# Patient Record
Sex: Male | Born: 1950 | Race: White | Hispanic: No | Marital: Married | Smoking: Never smoker
Health system: Southern US, Community
[De-identification: ages and names within clinical notes are randomized; demographics above are authoritative.]

## PROBLEM LIST (undated history)

## (undated) DIAGNOSIS — F32A Depression, unspecified: Secondary | ICD-10-CM

## (undated) DIAGNOSIS — K219 Gastro-esophageal reflux disease without esophagitis: Secondary | ICD-10-CM

## (undated) DIAGNOSIS — F1021 Alcohol dependence, in remission: Secondary | ICD-10-CM

## (undated) DIAGNOSIS — E039 Hypothyroidism, unspecified: Secondary | ICD-10-CM

## (undated) DIAGNOSIS — I609 Nontraumatic subarachnoid hemorrhage, unspecified: Secondary | ICD-10-CM

## (undated) DIAGNOSIS — M2550 Pain in unspecified joint: Secondary | ICD-10-CM

## (undated) DIAGNOSIS — E785 Hyperlipidemia, unspecified: Secondary | ICD-10-CM

## (undated) DIAGNOSIS — K449 Diaphragmatic hernia without obstruction or gangrene: Secondary | ICD-10-CM

## (undated) DIAGNOSIS — C439 Malignant melanoma of skin, unspecified: Secondary | ICD-10-CM

## (undated) DIAGNOSIS — K5730 Diverticulosis of large intestine without perforation or abscess without bleeding: Secondary | ICD-10-CM

## (undated) DIAGNOSIS — I499 Cardiac arrhythmia, unspecified: Secondary | ICD-10-CM

## (undated) DIAGNOSIS — M109 Gout, unspecified: Secondary | ICD-10-CM

## (undated) DIAGNOSIS — Z8669 Personal history of other diseases of the nervous system and sense organs: Secondary | ICD-10-CM

## (undated) DIAGNOSIS — F319 Bipolar disorder, unspecified: Secondary | ICD-10-CM

## (undated) DIAGNOSIS — I639 Cerebral infarction, unspecified: Secondary | ICD-10-CM

## (undated) DIAGNOSIS — M8580 Other specified disorders of bone density and structure, unspecified site: Secondary | ICD-10-CM

## (undated) HISTORY — DX: Hypothyroidism, unspecified: E03.9

## (undated) HISTORY — DX: Diaphragmatic hernia without obstruction or gangrene: K44.9

## (undated) HISTORY — DX: Gout, unspecified: M10.9

## (undated) HISTORY — DX: Depression, unspecified: F32.A

## (undated) HISTORY — DX: Hyperlipidemia, unspecified: E78.5

## (undated) HISTORY — DX: Cerebral infarction, unspecified: I63.9

## (undated) HISTORY — DX: Alcohol dependence, in remission: F10.21

## (undated) HISTORY — DX: Nontraumatic subarachnoid hemorrhage, unspecified: I60.9

## (undated) HISTORY — DX: Personal history of other diseases of the nervous system and sense organs: Z86.69

## (undated) HISTORY — DX: Diverticulosis of large intestine without perforation or abscess without bleeding: K57.30

## (undated) HISTORY — DX: Other specified disorders of bone density and structure, unspecified site: M85.80

## (undated) HISTORY — PX: OTHER SURGICAL HISTORY: SHX169

## (undated) HISTORY — PX: LOOP RECORDER INSERTION: EP1214

## (undated) HISTORY — DX: Bipolar disorder, unspecified: F31.9

## (undated) HISTORY — DX: Cardiac arrhythmia, unspecified: I49.9

## (undated) HISTORY — DX: Pain in unspecified joint: M25.50

## (undated) HISTORY — DX: Malignant melanoma of skin, unspecified: C43.9

---
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
## 1977-04-27 HISTORY — PX: VASECTOMY: SHX75

## 1981-04-27 HISTORY — PX: UPPER GASTROINTESTINAL ENDOSCOPY: SHX188

## 2001-04-27 HISTORY — PX: COLONOSCOPY: SHX174

## 2002-02-21 ENCOUNTER — Encounter: Payer: Self-pay | Admitting: Internal Medicine

## 2004-02-12 ENCOUNTER — Encounter: Admission: RE | Admit: 2004-02-12 | Discharge: 2004-02-12 | Payer: Self-pay | Admitting: Orthopedic Surgery

## 2004-03-07 ENCOUNTER — Ambulatory Visit: Payer: Self-pay | Admitting: Internal Medicine

## 2004-10-06 ENCOUNTER — Ambulatory Visit: Payer: Self-pay | Admitting: Internal Medicine

## 2004-10-09 ENCOUNTER — Ambulatory Visit: Payer: Self-pay | Admitting: Internal Medicine

## 2004-11-11 ENCOUNTER — Encounter: Payer: Self-pay | Admitting: Internal Medicine

## 2004-11-14 ENCOUNTER — Encounter: Admission: RE | Admit: 2004-11-14 | Discharge: 2004-11-14 | Payer: Self-pay

## 2005-02-26 ENCOUNTER — Ambulatory Visit: Payer: Self-pay | Admitting: Internal Medicine

## 2005-12-21 ENCOUNTER — Ambulatory Visit: Payer: Self-pay | Admitting: Internal Medicine

## 2005-12-31 ENCOUNTER — Ambulatory Visit: Payer: Self-pay | Admitting: Internal Medicine

## 2006-09-22 DIAGNOSIS — Z9089 Acquired absence of other organs: Secondary | ICD-10-CM | POA: Insufficient documentation

## 2006-09-22 DIAGNOSIS — F319 Bipolar disorder, unspecified: Secondary | ICD-10-CM | POA: Insufficient documentation

## 2006-09-22 DIAGNOSIS — G43009 Migraine without aura, not intractable, without status migrainosus: Secondary | ICD-10-CM | POA: Insufficient documentation

## 2006-11-17 ENCOUNTER — Ambulatory Visit: Payer: Self-pay | Admitting: Internal Medicine

## 2006-11-17 DIAGNOSIS — K219 Gastro-esophageal reflux disease without esophagitis: Secondary | ICD-10-CM | POA: Insufficient documentation

## 2006-11-18 ENCOUNTER — Encounter: Payer: Self-pay | Admitting: Internal Medicine

## 2006-11-25 ENCOUNTER — Encounter (INDEPENDENT_AMBULATORY_CARE_PROVIDER_SITE_OTHER): Payer: Self-pay | Admitting: *Deleted

## 2007-03-17 ENCOUNTER — Ambulatory Visit: Payer: Self-pay | Admitting: Internal Medicine

## 2007-08-01 ENCOUNTER — Encounter: Payer: Self-pay | Admitting: Internal Medicine

## 2008-02-08 ENCOUNTER — Ambulatory Visit: Payer: Self-pay | Admitting: Internal Medicine

## 2008-02-17 ENCOUNTER — Ambulatory Visit: Payer: Self-pay | Admitting: Internal Medicine

## 2008-11-26 ENCOUNTER — Ambulatory Visit: Payer: Self-pay | Admitting: Internal Medicine

## 2008-11-26 DIAGNOSIS — R9431 Abnormal electrocardiogram [ECG] [EKG]: Secondary | ICD-10-CM | POA: Insufficient documentation

## 2008-11-26 DIAGNOSIS — T887XXA Unspecified adverse effect of drug or medicament, initial encounter: Secondary | ICD-10-CM | POA: Insufficient documentation

## 2008-11-26 DIAGNOSIS — R002 Palpitations: Secondary | ICD-10-CM | POA: Insufficient documentation

## 2008-11-30 ENCOUNTER — Ambulatory Visit: Payer: Self-pay

## 2008-11-30 ENCOUNTER — Encounter: Payer: Self-pay | Admitting: Cardiology

## 2008-11-30 ENCOUNTER — Encounter (INDEPENDENT_AMBULATORY_CARE_PROVIDER_SITE_OTHER): Payer: Self-pay | Admitting: *Deleted

## 2008-12-12 ENCOUNTER — Telehealth (INDEPENDENT_AMBULATORY_CARE_PROVIDER_SITE_OTHER): Payer: Self-pay | Admitting: *Deleted

## 2009-01-01 ENCOUNTER — Encounter (INDEPENDENT_AMBULATORY_CARE_PROVIDER_SITE_OTHER): Payer: Self-pay | Admitting: *Deleted

## 2009-01-01 ENCOUNTER — Ambulatory Visit: Payer: Self-pay | Admitting: Internal Medicine

## 2009-01-01 LAB — CONVERTED CEMR LAB
OCCULT 1: NEGATIVE
OCCULT 2: NEGATIVE
OCCULT 3: NEGATIVE

## 2009-01-06 LAB — CONVERTED CEMR LAB
Cholesterol: 193 mg/dL (ref 0–200)
Folate: 5.5 ng/mL
HDL: 47 mg/dL (ref >39.00)
LDL Cholesterol: 115 mg/dL — ABNORMAL HIGH (ref 0–99)
Total CHOL/HDL Ratio: 4
Triglycerides: 154 mg/dL — ABNORMAL HIGH (ref 0.0–149.0)
VLDL: 30.8 mg/dL (ref 0.0–40.0)
Vitamin B-12: 211 pg/mL (ref 211–911)

## 2009-01-10 ENCOUNTER — Ambulatory Visit: Payer: Self-pay | Admitting: Internal Medicine

## 2009-01-10 DIAGNOSIS — D539 Nutritional anemia, unspecified: Secondary | ICD-10-CM | POA: Insufficient documentation

## 2009-01-10 DIAGNOSIS — E785 Hyperlipidemia, unspecified: Secondary | ICD-10-CM

## 2009-01-10 HISTORY — DX: Hyperlipidemia, unspecified: E78.5

## 2009-01-14 ENCOUNTER — Telehealth (INDEPENDENT_AMBULATORY_CARE_PROVIDER_SITE_OTHER): Payer: Self-pay | Admitting: *Deleted

## 2009-02-07 ENCOUNTER — Ambulatory Visit: Payer: Self-pay | Admitting: Internal Medicine

## 2009-03-15 ENCOUNTER — Ambulatory Visit: Payer: Self-pay | Admitting: Internal Medicine

## 2009-04-16 ENCOUNTER — Ambulatory Visit: Payer: Self-pay | Admitting: Internal Medicine

## 2009-05-29 ENCOUNTER — Ambulatory Visit: Payer: Self-pay | Admitting: Internal Medicine

## 2009-06-13 ENCOUNTER — Ambulatory Visit: Payer: Self-pay | Admitting: Internal Medicine

## 2009-07-09 ENCOUNTER — Ambulatory Visit: Payer: Self-pay | Admitting: Internal Medicine

## 2009-07-25 ENCOUNTER — Ambulatory Visit: Payer: Self-pay | Admitting: Internal Medicine

## 2009-08-02 ENCOUNTER — Ambulatory Visit: Payer: Self-pay | Admitting: Internal Medicine

## 2009-08-02 DIAGNOSIS — R634 Abnormal weight loss: Secondary | ICD-10-CM | POA: Insufficient documentation

## 2009-08-02 DIAGNOSIS — E781 Pure hyperglyceridemia: Secondary | ICD-10-CM | POA: Insufficient documentation

## 2010-05-16 ENCOUNTER — Ambulatory Visit
Admission: RE | Admit: 2010-05-16 | Discharge: 2010-05-16 | Payer: Self-pay | Source: Home / Self Care | Attending: Internal Medicine | Admitting: Internal Medicine

## 2010-05-16 ENCOUNTER — Encounter: Payer: Self-pay | Admitting: Internal Medicine

## 2010-05-16 DIAGNOSIS — R05 Cough: Secondary | ICD-10-CM | POA: Insufficient documentation

## 2010-05-16 DIAGNOSIS — F1010 Alcohol abuse, uncomplicated: Secondary | ICD-10-CM | POA: Insufficient documentation

## 2010-05-16 DIAGNOSIS — R059 Cough, unspecified: Secondary | ICD-10-CM | POA: Insufficient documentation

## 2010-05-17 DIAGNOSIS — M8580 Other specified disorders of bone density and structure, unspecified site: Secondary | ICD-10-CM | POA: Insufficient documentation

## 2010-05-19 LAB — CONVERTED CEMR LAB
ALT: 46 units/L (ref 0–53)
AST: 77 units/L — ABNORMAL HIGH (ref 0–37)
Albumin: 4.5 g/dL (ref 3.5–5.2)
Alkaline Phosphatase: 69 units/L (ref 39–117)
BUN: 8 mg/dL (ref 6–23)
Basophils Absolute: 0 10*3/uL (ref 0.0–0.1)
Basophils Relative: 1 % (ref 0–1)
Bilirubin, Direct: 0.3 mg/dL (ref 0.0–0.3)
CO2: 26 meq/L (ref 19–32)
Calcium: 9.5 mg/dL (ref 8.4–10.5)
Chloride: 103 meq/L (ref 96–112)
Cholesterol: 264 mg/dL — ABNORMAL HIGH (ref 0–200)
Creatinine, Ser: 0.9 mg/dL (ref 0.40–1.50)
Eosinophils Absolute: 0.1 10*3/uL (ref 0.0–0.7)
Eosinophils Relative: 1 % (ref 0–5)
Glucose, Bld: 100 mg/dL — ABNORMAL HIGH (ref 70–99)
HCT: 43.5 % (ref 39.0–52.0)
HDL: 174 mg/dL (ref >39)
Hemoglobin: 15.8 g/dL (ref 13.0–17.0)
Indirect Bilirubin: 0.6 mg/dL (ref 0.0–0.9)
LDL Cholesterol: 78 mg/dL (ref 0–99)
Lymphocytes Relative: 49 % — ABNORMAL HIGH (ref 12–46)
Lymphs Abs: 2 10*3/uL (ref 0.7–4.0)
MCHC: 36.3 g/dL — ABNORMAL HIGH (ref 30.0–36.0)
MCV: 101.9 fL — ABNORMAL HIGH (ref 78.0–100.0)
Monocytes Absolute: 0.4 10*3/uL (ref 0.1–1.0)
Monocytes Relative: 9 % (ref 3–12)
Neutro Abs: 1.6 10*3/uL — ABNORMAL LOW (ref 1.7–7.7)
Neutrophils Relative %: 40 % — ABNORMAL LOW (ref 43–77)
Platelets: 102 10*3/uL — ABNORMAL LOW (ref 150–400)
Potassium: 4 meq/L (ref 3.5–5.3)
RBC: 4.27 M/uL (ref 4.22–5.81)
RDW: 13.6 % (ref 11.5–15.5)
Sodium: 144 meq/L (ref 135–145)
TSH: 1.246 microintl units/mL (ref 0.350–4.500)
Total Bilirubin: 0.9 mg/dL (ref 0.3–1.2)
Total CHOL/HDL Ratio: 1.5
Total Protein: 6.7 g/dL (ref 6.0–8.3)
Triglycerides: 59 mg/dL (ref <150)
VLDL: 12 mg/dL (ref 0–40)
WBC: 4 10*3/uL (ref 4.0–10.5)

## 2010-05-20 ENCOUNTER — Ambulatory Visit
Admission: RE | Admit: 2010-05-20 | Discharge: 2010-05-20 | Payer: Self-pay | Source: Home / Self Care | Attending: Internal Medicine | Admitting: Internal Medicine

## 2010-05-25 LAB — CONVERTED CEMR LAB
ALT: 13 units/L (ref 0–53)
ALT: 8 units/L (ref 0–53)
ALT: 9 units/L (ref 0–53)
AST: 16 units/L (ref 0–37)
AST: 18 units/L (ref 0–37)
AST: 18 units/L (ref 0–37)
Albumin: 4.5 g/dL (ref 3.5–5.2)
Albumin: 4.6 g/dL (ref 3.5–5.2)
Alkaline Phosphatase: 49 units/L (ref 39–117)
Alkaline Phosphatase: 60 units/L (ref 39–117)
BUN: 10 mg/dL (ref 6–23)
BUN: 12 mg/dL (ref 6–23)
BUN: 15 mg/dL (ref 6–23)
Basophils Absolute: 0 10*3/uL (ref 0.0–0.1)
Basophils Absolute: 0 10*3/uL (ref 0.0–0.1)
Basophils Absolute: 0 10*3/uL (ref 0.0–0.1)
Basophils Relative: 0.2 % (ref 0.0–1.0)
Basophils Relative: 0.4 % (ref 0.0–3.0)
Basophils Relative: 0.5 % (ref 0.0–3.0)
Bilirubin, Direct: 0.1 mg/dL (ref 0.0–0.3)
Bilirubin, Direct: 0.2 mg/dL (ref 0.0–0.3)
CO2: 24 meq/L (ref 19–32)
Calcium: 10.8 mg/dL — ABNORMAL HIGH (ref 8.4–10.5)
Calcium: 9.4 mg/dL (ref 8.4–10.5)
Chloride: 110 meq/L (ref 96–112)
Creatinine, Ser: 1 mg/dL (ref 0.40–1.50)
Creatinine, Ser: 1.1 mg/dL (ref 0.4–1.5)
Creatinine, Ser: 1.2 mg/dL (ref 0.4–1.5)
Eosinophils Absolute: 0.1 10*3/uL (ref 0.0–0.6)
Eosinophils Absolute: 0.1 10*3/uL (ref 0.0–0.7)
Eosinophils Absolute: 0.3 10*3/uL (ref 0.0–0.7)
Eosinophils Relative: 0.8 % (ref 0.0–5.0)
Eosinophils Relative: 2.2 % (ref 0.0–5.0)
Eosinophils Relative: 2.9 % (ref 0.0–5.0)
Free T4: 0.7 ng/dL (ref 0.6–1.6)
Free T4: 1.02 ng/dL (ref 0.80–1.80)
GFR calc Af Amer: 80 mL/min
GFR calc non Af Amer: 66 mL/min
Glucose, Bld: 112 mg/dL — ABNORMAL HIGH (ref 70–99)
HCT: 36.1 % — ABNORMAL LOW (ref 39.0–52.0)
HCT: 40 % (ref 39.0–52.0)
HCT: 42.1 % (ref 39.0–52.0)
Hemoglobin: 13 g/dL (ref 13.0–17.0)
Hemoglobin: 13.6 g/dL (ref 13.0–17.0)
Hemoglobin: 15 g/dL (ref 13.0–17.0)
Hgb A1c MFr Bld: 4.2 % — ABNORMAL LOW (ref 4.6–6.1)
Indirect Bilirubin: 0.8 mg/dL (ref 0.0–0.9)
Lymphocytes Relative: 20.4 % (ref 12.0–46.0)
Lymphocytes Relative: 30.7 % (ref 12.0–46.0)
Lymphocytes Relative: 8.1 % — ABNORMAL LOW (ref 12.0–46.0)
Lymphs Abs: 1.3 10*3/uL (ref 0.7–4.0)
Lymphs Abs: 1.9 10*3/uL (ref 0.7–4.0)
MCHC: 34.1 g/dL (ref 30.0–36.0)
MCHC: 35.7 g/dL (ref 30.0–36.0)
MCHC: 36 g/dL (ref 30.0–36.0)
MCV: 104.5 fL — ABNORMAL HIGH (ref 78.0–100.0)
MCV: 97.5 fL (ref 78.0–100.0)
MCV: 98.2 fL (ref 78.0–100.0)
Magnesium: 2.2 mg/dL (ref 1.5–2.5)
Monocytes Absolute: 0.3 10*3/uL (ref 0.1–1.0)
Monocytes Absolute: 0.4 10*3/uL (ref 0.1–1.0)
Monocytes Absolute: 0.5 10*3/uL (ref 0.2–0.7)
Monocytes Relative: 3.3 % (ref 3.0–11.0)
Monocytes Relative: 4.3 % (ref 3.0–12.0)
Monocytes Relative: 7.2 % (ref 3.0–12.0)
Neutro Abs: 12.7 10*3/uL — ABNORMAL HIGH (ref 1.4–7.7)
Neutro Abs: 2.4 10*3/uL (ref 1.4–7.7)
Neutro Abs: 6.6 10*3/uL (ref 1.4–7.7)
Neutrophils Relative %: 59.4 % (ref 43.0–77.0)
Neutrophils Relative %: 72 % (ref 43.0–77.0)
Neutrophils Relative %: 87.6 % — ABNORMAL HIGH (ref 43.0–77.0)
PSA: 0.79 ng/mL (ref 0.10–4.00)
Platelets: 142 10*3/uL — ABNORMAL LOW (ref 150.0–400.0)
Platelets: 167 10*3/uL (ref 150.0–400.0)
Platelets: 240 10*3/uL (ref 150–400)
Potassium: 3.9 meq/L (ref 3.5–5.1)
Potassium: 4 meq/L (ref 3.5–5.1)
Potassium: 4.2 meq/L (ref 3.5–5.3)
RBC: 3.68 M/uL — ABNORMAL LOW (ref 4.22–5.81)
RBC: 3.82 M/uL — ABNORMAL LOW (ref 4.22–5.81)
RBC: 4.32 M/uL (ref 4.22–5.81)
RDW: 12.9 % (ref 11.5–14.6)
RDW: 13 % (ref 11.5–14.6)
RDW: 13.4 % (ref 11.5–14.6)
Sodium: 142 meq/L (ref 135–145)
TSH: 2.21 microintl units/mL (ref 0.35–5.50)
TSH: 2.602 microintl units/mL (ref 0.350–4.500)
TSH: 4.21 microintl units/mL (ref 0.35–5.50)
Total Bilirubin: 0.9 mg/dL (ref 0.3–1.2)
Total Bilirubin: 1.6 mg/dL — ABNORMAL HIGH (ref 0.3–1.2)
Total Protein: 6.8 g/dL (ref 6.0–8.3)
Total Protein: 6.8 g/dL (ref 6.0–8.3)
Vitamin B-12: 424 pg/mL (ref 211–911)
WBC: 14.5 10*3/uL — ABNORMAL HIGH (ref 4.5–10.5)
WBC: 4.1 10*3/uL — ABNORMAL LOW (ref 4.5–10.5)
WBC: 9.2 10*3/uL (ref 4.5–10.5)

## 2010-05-27 ENCOUNTER — Ambulatory Visit
Admission: RE | Admit: 2010-05-27 | Discharge: 2010-05-27 | Payer: Self-pay | Source: Home / Self Care | Attending: Internal Medicine | Admitting: Internal Medicine

## 2010-05-27 NOTE — Assessment & Plan Note (Signed)
Summary: CPX     PH   Vital Signs:  Patient Profile:   60 Years Old Male Weight:      184.38 pounds Pulse rate:   60 / minute Pulse rhythm:   regular BP sitting:   150 / 76  (left arm) Cuff size:   large  Pt. in pain?   no  Vitals Entered By: Wendall Stade (November 17, 2006 10:00 AM)                 Chief Complaint:  cpx.  History of Present Illness: trouble swallowing when talking not when eating for  6 weeks.Lasts < 10 seconds; no dysphagia, no globus..   Current Allergies: No known allergies  Updated/Current Medications (including changes made in today's visit):  LITHIUM CARBONATE 300 MG  CAPS (LITHIUM CARBONATE) 1 qam 1qnoon  and 2qhs SEROQUEL 50 MG  TABS (QUETIAPINE FUMARATE) 2 tabs qhs MAXALT 10 MG  TABS (RIZATRIPTAN BENZOATE) as needed migraine NAPROSYN 500 MG  TABS (NAPROXEN) as needed migraines TOPAMAX 100 MG  TABS (TOPIRAMATE) 1 by mouth qhs CIALIS 20 MG  TABS (TADALAFIL) prn [facility] OMEPRAZOLE 20 MG  TBEC (OMEPRAZOLE) 1 two times a day ac b'fast & eve mealX 8 weeks   Past Medical History:    [facility] admissions ; relapse 5/08;  1984 cp, Dx ERD  Past Surgical History:    Colonoscopy    Tonsillectomy   Family History:    Father: COAD,MI 7    Mother: COAD    Siblings: neg   Risk Factors:  Tobacco use:  never Alcohol use:  yes    Type:  none since 6/08 Exercise:  yes    Type:  walks 3-4X/week 1.5 mpd w/o C-P sx ,minor DOE   Review of Systems  General      Denies chills, fatigue, fever, loss of appetite, malaise, sleep disorder, sweats, weakness, and weight loss.  Eyes      Denies blurring, discharge, double vision, eye irritation, eye pain, halos, itching, light sensitivity, red eye, vision loss-1 eye, and vision loss-both eyes.      last exam 2006; asx   CV      Denies bluish discoloration of lips or nails, chest pain or discomfort, difficulty breathing at night, difficulty breathing while lying down, fainting, fatigue, leg cramps with  exertion, lightheadness, near fainting, palpitations, shortness of breath with exertion, swelling of feet, swelling of hands, and weight gain.      ankles down ? white & cold @ nite supine  Resp      Denies chest discomfort, cough, excessive snoring, hypersomnolence, morning headaches, shortness of breath, and sputum productive.  GI      See HPI      Complains of indigestion.      Denies abdominal pain, bloody stools, change in bowel habits, constipation, dark tarry stools, diarrhea, excessive appetite, gas, hemorrhoids, loss of appetite, nausea, and vomiting.      Rx TUMS, Rolaids  GU      Complains of decreased libido and erectile dysfunction.      Denies dysuria, genital sores, hematuria, incontinence, nocturia, urinary frequency, and urinary hesitancy.      Cialis of benefit  MS      Denies joint pain, joint redness, joint swelling, loss of strength, low back pain, mid back pain, muscle aches, muscle , cramps, muscle weakness, stiffness, and thoracic pain.  Derm      Denies changes in color of skin, changes in nail beds, dryness, excessive  perspiration, flushing, hair loss, itching, lesion(s), poor wound healing, and rash.  Neuro      Complains of headaches.      sees Dr Meryl Crutch, on Maxalt  Psych      Complains of anxiety, depression, easily angered, easily tearful, and irritability.      work pressure; sees Dr Carver Fila @ [facility], Lithium level off. To see Dr Evelene Croon  Endo      Denies cold intolerance, excessive hunger, excessive thirst, excessive urination, heat intolerance, polyuria, and weight change.  Heme      Denies abnormal bruising and bleeding.  Allergy      Denies hives or rash, itching eyes, persistent infections, seasonal allergies, and sneezing.   Physical Exam  General:     Well-developed,well-nourished,in no acute distress; alert,appropriate and cooperative throughout examinationBeard & moustache Head:     Normocephalic and atraumatic without obvious  abnormalities. No apparent alopecia or balding. Eyes:     sl art narrowing Ears:     TM sl dull Nose:     External nasal examination shows no deformity or inflammation. Nasal mucosa are pink and moist without lesions or exudates. Mouth:     Oral mucosa and oropharynx without lesions or exudates.  Teeth in good repair. Neck:     No deformities, masses, or tenderness noted. Lungs:     Normal respiratory effort, chest expands symmetrically. Lungs are clear to auscultation, no crackles or wheezes. Heart:     Normal rate and regular rhythm. S1 and S2 normal without gallop, murmur, click, rub or other extra sounds. Abdomen:     Bowel sounds positive,abdomen soft and non-tender without masses, organomegaly. Large reducible hernia R Rectal:     No external abnormalities noted. Normal sphincter tone. No rectal masses or tenderness.FOB neg Genitalia:     Testes bilaterally descended without nodularity, tenderness or masses. No scrotal masses or lesions. No penis lesions or urethral discharge. Prostate:     Prostate gland firm and smooth, no enlargement, nodularity, tenderness, mass, asymmetry or induration. Msk:     No deformity or scoliosis noted of thoracic or lumbar spine.   Pulses:     R and L carotid,radial,femoral,dorsalis pedis and posterior tibial pulses are full and equal bilaterally Extremities:     No clubbing, cyanosis, edema, or deformity noted with normal full range of motion of all joints.   Neurologic:     alert & oriented X3, gait normal, and DTRs symmetrical and normal.   Skin:     Intact without suspicious lesions or rashes Cervical Nodes:     No lymphadenopathy noted Axillary Nodes:     No palpable lymphadenopathy Psych:     normally interactive, good eye contact, subdued, and slightly anxious.      Impression & Recommendations:  Problem # 1:  EXAMINATION, ROUTINE MEDICAL (ICD-V70.0)  Orders: EKG w/ Interpretation (93000) T- * Misc. Laboratory test  [phone])   Problem # 2:  REFLUX, ESOPHAGEAL (ICD-530.81)  His updated medication list for this problem includes:    [facility] Omeprazole 20 Mg Tbec (Omeprazole) .Marland Kitchen... 1 two times a day ac b'fast & eve mealx 8 weeks   Problem # 3:  HYPERLIPIDEMIA NEC/NOS (ICD-272.4)  Orders: T- * Misc. Laboratory test [phone])   Medications Added to Medication List This Visit: 1)  Lithium Carbonate 300 Mg Caps (Lithium carbonate) .Marland Kitchen.. 1 qam 1qnoon  and 2qhs 2)  Seroquel 50 Mg Tabs (Quetiapine fumarate) .... 2 tabs qhs 3)  Maxalt 10 Mg Tabs (Rizatriptan benzoate) .Marland KitchenMarland KitchenMarland Kitchen  As needed migraine 4)  Naprosyn 500 Mg Tabs (Naproxen) .... As needed migraines 5)  Topamax 100 Mg Tabs (Topiramate) .Marland Kitchen.. 1 by mouth qhs 6)  Cialis 20 Mg Tabs (Tadalafil) .... Prn 7)  Cvs Omeprazole 20 Mg Tbec (Omeprazole) .Marland Kitchen.. 1 two times a day ac b'fast & eve mealx 8 weeks  Other Orders: Venipuncture (46962) TLB-BMP (Basic Metabolic Panel-BMET) (80048-METABOL) TLB-CBC Platelet - w/Differential (85025-CBCD) TLB-Hepatic/Liver Function Pnl (80076-HEPATIC) TLB-TSH (Thyroid Stimulating Hormone) (84443-TSH) TLB-PSA (Prostate Specific Antigen) (84153-PSA)   Patient Instructions: 1)  GI consult if throat symptoms persist after Omeprazole Rx X 8 weeks. 2)  Avoid foods high in acid (tomatoes, citrus juices, spicy foods). Avoid eating within two hours of lying down or before exercising. Do not over eat; try smaller more frequent meals. Elevate head of bed twelve inches when sleeping.    Prescriptions: [facility]S OMEPRAZOLE 20 MG  TBEC (OMEPRAZOLE) 1 two times a day ac b'fast & eve mealX 8 weeks  #60 x 1   Entered and Authorized by:   Marga Melnick MD   Signed by:   Marga Melnick MD on 11/17/2006   Method used:   Print then Give to Patient   RxID:   [id number]

## 2010-05-27 NOTE — Assessment & Plan Note (Signed)
Summary: b12 shot/kdc  Nurse Visit   Allergies: No Known Drug Allergies  Medication Administration  Injection # 1:    Medication: Vit B12 1000 mcg    Diagnosis: UNSPECIFIED ANEMIA (ICD-285.9)    Route: IM    Site: L deltoid    Exp Date: 12/2010    Lot #: [id number]    Mfr: American Regent    Patient tolerated injection without complications    Given by: Rene Kocher Hoops (April 16, 2009 1:42 PM)  Orders Added: 1)  Admin of Therapeutic Inj  intramuscular or subcutaneous [96372] 2)  Vit B12 1000 mcg [J3420]   Medication Administration  Injection # 1:    Medication: Vit B12 1000 mcg    Diagnosis: UNSPECIFIED ANEMIA (ICD-285.9)    Route: IM    Site: L deltoid    Exp Date: 12/2010    Lot #: [id number]    Mfr: American Regent    Patient tolerated injection without complications    Given by: Rene Kocher Hoops (April 16, 2009 1:42 PM)  Orders Added: 1)  Admin of Therapeutic Inj  intramuscular or subcutaneous [96372] 2)  Vit B12 1000 mcg [J3420]

## 2010-05-27 NOTE — Assessment & Plan Note (Signed)
Summary: FLU SHOT/CBS  Nurse Visit    Prior Medications: LITHIUM CARBONATE 300 MG  CAPS (LITHIUM CARBONATE) 1 qam 1qnoon  and 2qhs SEROQUEL 50 MG  TABS (QUETIAPINE FUMARATE) 2 tabs qhs MAXALT 10 MG  TABS (RIZATRIPTAN BENZOATE) as needed migraine NAPROSYN 500 MG  TABS (NAPROXEN) as needed migraines TOPAMAX 100 MG  TABS (TOPIRAMATE) 1 by mouth qhs CIALIS 20 MG  TABS (TADALAFIL) prn [facility] OMEPRAZOLE 20 MG  TBEC (OMEPRAZOLE) 1 two times a day ac b'fast & eve mealX 8 weeks Current Allergies: No known allergies    Influenza Vaccine    Vaccine Type: Fluvax Non-MCR    Site: left deltoid    Mfr: Sanofi Pasteur    Dose: 0.5 ml    Route: IM    Given by: Doristine Devoid    Exp. Date: 10/25/2007    Lot #: [id number]  Flu Vaccine Consent Questions    Do you have a history of severe allergic reactions to this vaccine? no    Any prior history of allergic reactions to egg and/or gelatin? no    Do you have a sensitivity to the preservative Thimersol? no    Do you have a past history of Guillan-Barre Syndrome? no    Do you currently have an acute febrile illness? no    Have you ever had a severe reaction to latex? no    Vaccine information given and explained to patient? yes   Orders Added: 1)  Influenza Vaccine NON MCR [00028]    ]

## 2010-05-27 NOTE — Assessment & Plan Note (Signed)
Summary: b12 shot/kdc  Nurse Visit   Allergies: No Known Drug Allergies  Medication Administration  Injection # 1:    Medication: Vit B12 1000 mcg    Diagnosis: UNSPECIFIED ANEMIA (ICD-285.9)    Route: IM    Site: R deltoid    Exp Date: 02/26/2011    Lot #: [id number]    Mfr: American Regent    Patient tolerated injection without complications    Given by: Doristine Devoid (July 09, 2009 1:18 PM)  Orders Added: 1)  Admin of Therapeutic Inj  intramuscular or subcutaneous [96372] 2)  Vit B12 1000 mcg [J3420]   Medication Administration  Injection # 1:    Medication: Vit B12 1000 mcg    Diagnosis: UNSPECIFIED ANEMIA (ICD-285.9)    Route: IM    Site: R deltoid    Exp Date: 02/26/2011    Lot #: [id number]    Mfr: American Regent    Patient tolerated injection without complications    Given by: Doristine Devoid (July 09, 2009 1:18 PM)  Orders Added: 1)  Admin of Therapeutic Inj  intramuscular or subcutaneous [96372] 2)  Vit B12 1000 mcg [J3420]

## 2010-05-27 NOTE — Assessment & Plan Note (Signed)
Summary: FOR B-12//PH  Nurse Visit   Allergies: No Known Drug Allergies  Medication Administration  Injection # 1:    Medication: Vit B12 1000 mcg    Diagnosis: UNSPECIFIED ANEMIA (ICD-285.9)    Route: IM    Site: L deltoid    Exp Date: 02/2011    Lot #: [id number]    Patient tolerated injection without complications    Given by: Shary Decamp (June 13, 2009 12:38 PM)  Orders Added: 1)  Vit B12 1000 mcg [J3420] 2)  Admin of Therapeutic Inj  intramuscular or subcutaneous [45409]

## 2010-05-27 NOTE — Progress Notes (Signed)
Summary: rx  Phone Note From Pharmacy   Caller: pharmacist Summary of Call: called said pt bought in a paper rx dated 01/10/09 for FOLIC ACID, needs directions (not on med list Dr Alwyn Ren gave pt a rx at [facility] 01/10/09, asked Dr Alwyn Ren directions perDr Alwyn Ren 1 by mouth once daily pharmacist informed Initial call taken by: Kandice Hams,  January 14, 2009 10:03 AM

## 2010-05-27 NOTE — Letter (Signed)
Summary: Results Follow up Letter  [facility] [place]  [address] [place], [place] [address]   Phone: [phone]  Fax: [phone]    11/25/2006 MRN: [id number]  [facility] [address] [place], [place]  [address]  Dear Mr. LANDI,  The following are the results of your recent test(s):  Test         Result    Pap Smear:        Normal _____  Not Normal _____ Comments: ______________________________________________________ Cholesterol: LDL(Bad cholesterol):         Your goal is less than:         HDL (Good cholesterol):       Your goal is more than: Comments:  ______________________________________________________ Mammogram:        Normal _____  Not Normal _____ Comments:  ___________________________________________________________________ Hemoccult:        Normal _____  Not normal _______ Comments:    _____________________________________________________________________ Other Tests:  SEE ATTACHED LABS AND COMMENTS  We routinely do not discuss normal results over the telephone.  If you desire a copy of the results, or you have any questions about this information we can discuss them at your next office visit.   Sincerely,

## 2010-05-27 NOTE — Assessment & Plan Note (Signed)
Summary: C/O IRR HEARBEAT OCCASIONLLY PT WAS INSTRUCED TO ED TODAY 7/3...   Vital Signs:  Patient profile:   60 year old male Weight:      187 pounds Temp:     98.7 degrees F oral Pulse rate:   72 / minute Resp:     16 per minute BP sitting:   128 / 80  (left arm) Cuff size:   large  Vitals Entered By: Shonna Chock (November 26, 2008 12:36 PM) CC: IRREGULAR HEART BEAT, ? LABS, HERNIA F/U, 2 1.2 YEARS RELASPED-NOT ABLE TO GET SOBER SINCE Comments REVIEWED MED LIST, PATIENT AGREED DOSE AND INSTRUCTION CORRECT    CC:  IRREGULAR HEART BEAT, ? LABS, HERNIA F/U, and 2 1.2 YEARS RELASPED-NOT ABLE TO GET SOBER SINCE.  History of Present Illness: "Fluttering" almost every day for 20 min - 5 hours for 2-3 weeks w/o trigger. Associated with nausea  & clamminess but no chest pain. No PMH of same. Increased stress for 2&1/2 years; he lost job & marital stress.He has been drinking intermittently for that period. He has been in treatment X 2 .  Allergies (verified): No Known Drug Allergies  Review of Systems General:  Complains of sweats; denies chills and fever; Weight very variable over 2.5 years. Eyes:  Denies double vision and vision loss-both eyes. ENT:  Denies difficulty swallowing and hoarseness. CV:  See HPI; Denies chest pain or discomfort, difficulty breathing at night, difficulty breathing while lying down, leg cramps with exertion, shortness of breath with exertion, swelling of feet, and swelling of hands. Resp:  Denies cough, coughing up blood, shortness of breath, and sputum productive; Occa cp with inspiration  when supine. GI:  Denies constipation and diarrhea. Derm:  Denies changes in nail beds, dryness, and hair loss. Neuro:  Denies numbness and tingling. 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 Endo:  Complains of heat intolerance; denies cold  intolerance.  Physical Exam  General:  Thin,in no acute distress; alert,appropriate and cooperative throughout examination Neck:  No deformities, masses, or tenderness noted. Lungs:  Normal respiratory effort, chest expands symmetrically. Lungs are clear to auscultation, no crackles or wheezes. Heart:  normal rate, regular rhythm, no gallop, no rub, no JVD, no HJR, and grade 1 /6 systolic murmur R base.  Rare premature Abdomen:  Bowel sounds positive,abdomen soft and non-tender without masses, organomegaly or hernias noted. Pulses:  R and L carotid,radial,dorsalis pedis and posterior tibial pulses are full and equal bilaterally Extremities:  No clubbing, cyanosis, edema. Neurologic:  DTRs symmetrical and normal.   Skin:  Intact without suspicious lesions or rashes Psych:  Oriented X3, memory intact for recent and remote, normally interactive, good eye contact, not anxious appearing, and not depressed appearing.     Impression & Recommendations:  Problem # 1:  PALPITATIONS (ICD-785.1)  Orders: EKG w/ Interpretation (93000) Venipuncture (16109) TLB-CBC Platelet - w/Differential (85025-CBCD) TLB-TSH (Thyroid Stimulating Hormone) (84443-TSH) TLB-Calcium (82310-CA) TLB-Potassium (K+) (84132-K) TLB-Magnesium (Mg) (83735-MG) Misc. Referral (Misc. Ref) TLB-T4 (Thyrox), Free [phone]) TLB-ALT (SGPT) (84460-ALT) TLB-AST (SGOT) (84450-SGOT) TLB-Creatinine, Blood (82565-CREA) TLB-BUN (Urea Nitrogen) (84520-BUN)  Problem # 2:  UNS ADVRS EFF UNS RX MEDICINAL&BIOLOGICAL SBSTNC (ICD-995.20)  Orders: Venipuncture (19147) TLB-ALT (SGPT) (84460-ALT) TLB-AST (SGOT) (84450-SGOT) TLB-Creatinine, Blood (82565-CREA) TLB-BUN (Urea Nitrogen) (84520-BUN)  Problem # 3:  ABNORMAL ELECTROCARDIOGRAM (ICD-794.31) 1st degree heart block  Complete Medication List: 1)  Lithium Carbonate 300 Mg Caps (Lithium carbonate) .Marland KitchenMarland KitchenMarland Kitchen  1 qam 1qnoon  and 2qhs 2)  Seroquel 50 Mg Tabs (Quetiapine fumarate) .... 2  tabs qhs 3)  Maxalt 10 Mg Tabs (Rizatriptan benzoate) .... As needed migraine 4)  Naprosyn 500 Mg Tabs (Naproxen) .... As needed migraines 5)  Topamax 100 Mg Tabs (Topiramate) .Marland Kitchen.. 1 by mouth qhs 6)  Cialis 20 Mg Tabs (Tadalafil) .... Prn 7)  Depakote 500 Mg Tbec (Divalproex sodium) .... 3 by mouth at bedtime  Patient Instructions: 1)  Avoid stimulants as discussed.  Appended Document: C/O IRR HEARBEAT OCCASIONLLY PT WAS INSTRUCED TO ED TODAY 7/3... 24 hour Holter Monitor 11/30/08  report reviewed : INFREQUENT premature beats from ventricle (lower chamber). No significant or prolonged rhythm abnormalities found. If symptoms persist or progress , I recommend a Cardiology consult.Continue to avoid stimulants as prev discussed. [employer]

## 2010-05-27 NOTE — Procedures (Signed)
Summary: summary report  summary report   Imported By: Mirna Mires 12/10/2008 09:56:44  _____________________________________________________________________  External Attachment:    Type:   Image     Comment:   External Document

## 2010-05-27 NOTE — Assessment & Plan Note (Signed)
Summary: FLU SHOT//VGJ  Nurse Visit    Prior Medications: LITHIUM CARBONATE 300 MG  CAPS (LITHIUM CARBONATE) 1 qam 1qnoon  and 2qhs SEROQUEL 50 MG  TABS (QUETIAPINE FUMARATE) 2 tabs qhs MAXALT 10 MG  TABS (RIZATRIPTAN BENZOATE) as needed migraine NAPROSYN 500 MG  TABS (NAPROXEN) as needed migraines TOPAMAX 100 MG  TABS (TOPIRAMATE) 1 by mouth qhs CIALIS 20 MG  TABS (TADALAFIL) prn CVS OMEPRAZOLE 20 MG  TBEC (OMEPRAZOLE) 1 two times a day ac b'fast & eve mealX 8 weeks Current Allergies: No known allergies   Flu Vaccine Consent Questions     Do you have a history of severe allergic reactions to this vaccine? no    Any prior history of allergic reactions to egg and/or gelatin? no    Do you have a sensitivity to the preservative Thimersol? no    Do you have a past history of Guillan-Barre Syndrome? no    Do you currently have an acute febrile illness? no    Have you ever had a severe reaction to latex? no    Vaccine information given and explained to patient? yes    Are you currently pregnant? no    Lot Number:[id number]   Site Given  Left Deltoid IM   Orders Added: 1)  Admin 1st Vaccine [90471] 2)  Flu Vaccine 22yrs + Baden.Dew    ]

## 2010-05-27 NOTE — Letter (Signed)
Summary: Results Follow up Letter  [facility] [place]  [address] [place], [place] [address]   Phone: [phone]  Fax: [phone]    11/30/2008 MRN: [id number]  [facility] [address] [place], [place]  [address]  Dear Jeffrey Acosta,  The following are the results of your recent test(s):  Test         Result    Pap Smear:        Normal _____  Not Normal _____ Comments: ______________________________________________________ Cholesterol: LDL(Bad cholesterol):         Your goal is less than:         HDL (Good cholesterol):       Your goal is more than: Comments:  ______________________________________________________ Mammogram:        Normal _____  Not Normal _____ Comments:  ___________________________________________________________________ Hemoccult:        Normal _____  Not normal _______ Comments:    _____________________________________________________________________ Other Tests: PLEASE SEE ATTACHED LABS DONE ON 11/26/2008    We routinely do not discuss normal results over the telephone.  If you desire a copy of the results, or you have any questions about this information we can discuss them at your next office visit.   Sincerely,

## 2010-05-27 NOTE — Assessment & Plan Note (Signed)
Summary: PNEU SHOT//VGJ  Nurse Visit    Prior Medications: LITHIUM CARBONATE 300 MG  CAPS (LITHIUM CARBONATE) 1 qam 1qnoon  and 2qhs SEROQUEL 50 MG  TABS (QUETIAPINE FUMARATE) 2 tabs qhs MAXALT 10 MG  TABS (RIZATRIPTAN BENZOATE) as needed migraine NAPROSYN 500 MG  TABS (NAPROXEN) as needed migraines TOPAMAX 100 MG  TABS (TOPIRAMATE) 1 by mouth qhs CIALIS 20 MG  TABS (TADALAFIL) prn CVS OMEPRAZOLE 20 MG  TBEC (OMEPRAZOLE) 1 two times a day ac b'fast & eve mealX 8 weeks Current Allergies: No known allergies    Pneumovax Vaccine    Vaccine Type: Pneumovax    Site: left deltoid    Mfr: Merck    Dose: 0.5 ml    Route: IM    Given by: Floydene Flock CMA    Exp. Date: 03/22/2009    Lot #: [id number]   Orders Added: 1)  Pneumococcal Vaccine [90732] 2)  Admin 1st Vaccine [email or web address]    ]

## 2010-05-27 NOTE — Progress Notes (Signed)
Summary: Monitor Results  Phone Note Outgoing Call Call back at [facility] Phone [phone]   Call placed by: Shonna Chock,  December 12, 2008 3:39 PM Call placed to: Patient Summary of Call: D/W patient:  24 hour Holter Monitor 11/30/08  report reviewed : INFREQUENT premature beats from ventricle (lower chamber). No significant or prolonged rhythm abnormalities found. If symptoms persist or progress , I recommend a Cardiology consult.Continue to avoid stimulants as prev discussed. [employer]  Chrae Malloy  December 12, 2008 3:40 PM

## 2010-05-27 NOTE — Assessment & Plan Note (Signed)
Summary: Follow-up on Labs/scm   Vital Signs:  Patient profile:   60 year old male Weight:      180.4 pounds Pulse rate:   64 / minute Resp:     14 per minute BP sitting:   128 / 80  (left arm) Cuff size:   large  Vitals Entered By: Shonna Chock (August 02, 2009 1:33 PM) CC: Follow-up visit: Discuss Labs (copies given) Comments REVIEWED MED LIST, PATIENT AGREED DOSE AND INSTRUCTION CORRECT    CC:  Follow-up visit: Discuss Labs (copies given).  History of Present Illness: Labs reviewed & risks discussed. NMR : Triglycerides 889; LDL not measurable . No diet; walking 2 mpd 3X/week .Fluttering has resolved , but  sharp L inferior chest pain with "too much of  a strain, like trimming trees , mowing whole yard or walking fast".Occasional L shoulder radiation. EKG 11/2008 reviewed : 1st degree AV block, no ischemic changes. PMH of chest pain due to  ERD.  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 Eyes:  Denies blurring, double vision, and vision loss-both eyes. ENT:  Denies difficulty swallowing and hoarseness. CV:  See HPI; Denies leg cramps with exertion, palpitations, and shortness of breath with exertion. Resp:  Denies cough, coughing up blood, and sputum  productive. GI:  Complains of indigestion; denies abdominal pain, bloody stools, and dark tarry stools. MS:  Complains of low back pain. Derm:  Denies changes in nail beds, dryness, hair loss, and poor wound healing. Neuro:  Denies numbness and tingling; Occa sciatica. Endo:  Complains of excessive thirst and excessive urination; denies cold intolerance, excessive hunger, and heat intolerance.  Physical Exam  General:  Thin  but well-nourished,in no acute distress; alert,appropriate and cooperative throughout examination Eyes:  No corneal or conjunctival inflammation noted. Marland Kitchen Perrla. No lid lag. No icterus Mouth:  Oral mucosa and oropharynx without lesions or exudates.  Teeth in good repair. No pharyngeal erythema.   Neck:  No deformities, masses, or tenderness noted. Lungs:  Normal respiratory effort, chest expands symmetrically. Lungs are clear to auscultation, no crackles or wheezes. Heart:  Normal rate and regular rhythm. S1 and S2 normal without gallop, murmur, click, rub.S4 Abdomen:  Bowel sounds positive,abdomen soft and non-tender without masses, organomegaly or hernias noted. Msk:  Classic "low back crawl" due to DDD Pulses:  R and L carotid,radial,dorsalis pedis and posterior tibial pulses are full and equal bilaterally Extremities:  No clubbing, cyanosis, edema. Neurologic:  alert & oriented X3 and DTRs symmetrical and normal.   Skin:  Intact without suspicious lesions or rashes Cervical Nodes:  No lymphadenopathy noted Axillary Nodes:  No palpable lymphadenopathy Psych:  memory intact for recent and remote, normally interactive, and good eye contact.     Impression & Recommendations:  Problem # 1:  HYPERTRIGLYCERIDEMIA (ICD-272.1)  Problem # 2:  CHEST PAIN UNSPECIFIED (ICD-786.50) Seemingly with positional exertion, not with walking. EKG: no ischemic changes Orders: EKG w/ Interpretation (93000) Venipuncture (04540)  Problem # 3:  WEIGHT LOSS  (ICD-783.21)  Orders: Venipuncture (98119)  Problem # 4:  UNSPECIFIED ANEMIA (ICD-285.9) resolved  Problem # 5:  PALPITATIONS (ICD-785.1) resolved  Complete Medication List: 1)  Lithium Carbonate 300 Mg Caps (Lithium carbonate) .Marland Kitchen.. 1 qam 1qnoon  and 2qhs 2)  Seroquel 50 Mg Tabs (Quetiapine fumarate) .... 2 tabs qhs 3)  Maxalt 10 Mg Tabs (Rizatriptan benzoate) .... As needed migraine 4)  Naprosyn 500 Mg Tabs (Naproxen) .... As needed migraines 5)  Topamax 100 Mg Tabs (Topiramate) .Marland Kitchen.. 1 by mouth qhs 6)  Cialis 20 Mg Tabs (Tadalafil) .... Prn 7)  Depakote 500 Mg Tbec (Divalproex sodium) .... 3 by mouth at bedtime 8)  Naltrexone Hcl 50 Mg Tabs (Naltrexone hcl) .Marland Kitchen.. 1 by mouth once daily  Patient Instructions: 1)  Follow [place] or [place] low carb program. Consume LESS THAN 40 grams of sugar / day from foods & drinks with High Fructose Corn Syrup as #1,2 or 3 3 on label. Complete stool cards.

## 2010-05-27 NOTE — Letter (Signed)
Summary: Results Follow up Letter  [facility]  [address] [place], [place] [address]   Phone: [phone]  Fax: [phone]    01/01/2009 MRN: [id number]  [facility] [address] [place], [place]  [address]  Dear Mr. MARONE,  The following are the results of your recent test(s):  Test         Result    Pap Smear:        Normal _____  Not Normal _____ Comments: ______________________________________________________ Cholesterol: LDL(Bad cholesterol):         Your goal is less than:         HDL (Good cholesterol):       Your goal is more than: Comments:  ______________________________________________________ Mammogram:        Normal _____  Not Normal _____ Comments:  ___________________________________________________________________ Hemoccult:        Normal _X____  Not normal _______ Comments:    _____________________________________________________________________ Other Tests:    We routinely do not discuss normal results over the telephone.  If you desire a copy of the results, or you have any questions about this information we can discuss them at your next office visit.   Sincerely,

## 2010-05-27 NOTE — Assessment & Plan Note (Signed)
Summary: B-12 SHOT///SPH  Nurse Visit   Allergies: No Known Drug Allergies  Medication Administration  Injection # 1:    Medication: Vit B12 1000 mcg    Diagnosis: UNSPECIFIED ANEMIA (ICD-285.9)    Route: IM    Site: R deltoid    Exp Date: 09/2010    Lot #: [id number]    Mfr: American Regent    Patient tolerated injection without complications    Given by: Rene Kocher Hoops (February 07, 2009 10:59 AM)  Orders Added: 1)  Admin of Therapeutic Inj  intramuscular or subcutaneous [96372] 2)  Vit B12 1000 mcg [J3420] 3)  Admin 1st Vaccine [90471] 4)  Flu Vaccine 69yrs + [01601] 5)  Admin 1st Vaccine [09323]   Medication Administration  Injection # 1:    Medication: Vit B12 1000 mcg    Diagnosis: UNSPECIFIED ANEMIA (ICD-285.9)    Route: IM    Site: R deltoid    Exp Date: 09/2010    Lot #: [id number]    Mfr: American Regent    Patient tolerated injection without complications    Given by: Rene Kocher Hoops (February 07, 2009 10:59 AM)  Orders Added: 1)  Admin of Therapeutic Inj  intramuscular or subcutaneous [96372] 2)  Vit B12 1000 mcg [J3420] 3)  Admin 1st Vaccine [90471] 4)  Flu Vaccine 11yrs + [22025] 5)  Admin 1st Vaccine [42706] Flu Vaccine Consent Questions     Do you have a history of severe allergic reactions to this vaccine? no    Any prior history of allergic reactions to egg and/or gelatin? no    Do you have a sensitivity to the preservative Thimersol? no    Do you have a past history of Guillan-Barre Syndrome? no    Do you currently have an acute febrile illness? no    Have you ever had a severe reaction to latex? no    Vaccine information given and explained to patient? yes    Are you currently pregnant? no    Lot Number:[id number] Vaccine Consent Questions     Do you have a history of severe allergic reactions to this vaccine? no    Any prior history of allergic reactions to egg and/or gelatin? no    Do you have a sensitivity to the preservative Thimersol? no    Do you have a  past history of Guillan-Barre Syndrome? no    Do you currently have an acute febrile illness? no    Have you ever had a severe reaction to latex? no    Vaccine information given and explained to patient? yes    Are you currently pregnant? no    Lot Number:[id number]   Exp Date:10/24/2009   Site Given  Left Deltoid IM   Exp Date:10/24/2009   Site Given  Left Deltoid IMVaccine 42yrs + [id number] .  .lbflu

## 2010-05-27 NOTE — Assessment & Plan Note (Signed)
Summary: b 12 injection  Nurse Visit   Allergies: No Known Drug Allergies  Medication Administration  Injection # 1:    Medication: Vit B12 1000 mcg    Diagnosis: UNSPECIFIED ANEMIA (ICD-285.9)    Route: IM    Site: L deltoid    Exp Date: 01/2011    Lot #: [id number]    Mfr: American Regent    Patient tolerated injection without complications    Given by: Floydene Flock (May 29, 2009 1:38 PM)  Orders Added: 1)  Admin of Therapeutic Inj  intramuscular or subcutaneous [96372] 2)  Vit B12 1000 mcg [J3420]   Medication Administration  Injection # 1:    Medication: Vit B12 1000 mcg    Diagnosis: UNSPECIFIED ANEMIA (ICD-285.9)    Route: IM    Site: L deltoid    Exp Date: 01/2011    Lot #: [id number]    Mfr: American Regent    Patient tolerated injection without complications    Given by: Floydene Flock (May 29, 2009 1:38 PM)  Orders Added: 1)  Admin of Therapeutic Inj  intramuscular or subcutaneous [96372] 2)  Vit B12 1000 mcg [J3420]

## 2010-05-27 NOTE — Assessment & Plan Note (Signed)
Summary: FU ON LABS?/KDC   Vital Signs:  Patient profile:   60 year old male Weight:      183.4 pounds Pulse rate:   66 / minute Resp:     15 per minute BP sitting:   126 / 78  (left arm)  Vitals Entered By: Doristine Devoid (January 10, 2009 2:35 PM) CC: f/u on labs    CC:  f/u on labs .  History of Present Illness: Labs reviewed: LDL 115 but HDL 47; B12 211 & folate 5.4. HCT36.1 & plat # 142,000 with normal WBC & differential. No PMH of anemia. FOB neg X 6 earlier  this month. Prior NMR reviewed ; dramatic improvement in diet .TG have  dropped from 341 to 154; alcohol abstinence may be playing role in TG drop as he had started drinking heavily again after prolonged sobriety.  Allergies: No Known Drug Allergies  Past History:  Past Medical History: ETOH admissions ; relapse 5/08;  1984  chest pain , diagnosis : ERD Hyperlipidemia: NMR Lipoprofile : LDL 93 (1519/1372 small dense particles, Triglycerides 341), LDL goal = < 100  Past Surgical History: Colonoscopy 2007: neg, Dr Jarold Motto; Upper Endo  1984: HH Tonsillectomy  Review of Systems General:  Denies chills, fever, sweats, and weight loss. ENT:  Denies difficulty swallowing and hoarseness. GI:  Complains of indigestion; denies abdominal pain, bloody stools, dark tarry stools, and excessive appetite; Intermittent loss of appetite; frequent dyspepsia,2-4  TUMS taken daily . No Dysphagia.. Neuro:  Complains of headaches; denies numbness and tingling; No burning in limbs; averages Naprosyn 1X /month(should not be GI risk factor @ this level).  Physical Exam  General:  Thin,in no acute distress; alert,appropriate and cooperative throughout examination Eyes:  No corneal or conjunctival inflammation noted.Marland Kitchen Perrla. No icterus Mouth:  Oral mucosa and oropharynx without lesions or exudates.  Teeth in good repair. No pharyngeal erythema.   Lungs:  Normal respiratory effort, chest expands symmetrically. Lungs are clear to  auscultation, no crackles or wheezes. Heart:  normal rate, regular rhythm, no gallop, no rub, no JVD, no HJR, and grade 1 /6 systolic murmur.   Abdomen:  Bowel sounds positive,abdomen soft and non-tender without masses, organomegaly or hernias noted. Pulses:  R and L carotid,radial,dorsalis pedis and posterior tibial pulses are full and equal bilaterally Extremities:  No clubbing, cyanosis, edema.  Neurologic:  alert & oriented X3 and DTRs symmetrical and 1& 1/2 +. No tremor Skin:  Facial Rosacea. No jaundice Cervical Nodes:  No lymphadenopathy noted Axillary Nodes:  No palpable lymphadenopathy Psych:  memory intact for recent and remote, normally interactive, good eye contact, not anxious appearing, and not depressed appearing.  Open & fortrhright about his alcoholism   Impression & Recommendations:  Problem # 1:  UNSPECIFIED ANEMIA (ICD-285.9)  low normal B12 & folate; neg FOB X 6  Orders: Vit B12 1000 mcg (J3420) Admin of Therapeutic Inj  intramuscular or subcutaneous (27253)  Problem # 2:  REFLUX, ESOPHAGEAL (ICD-530.81)  as needed Naprosyn for migraines; TUMS daily for ERD  His updated medication list for this problem includes:    Zantac 150 Maximum Strength 150 Mg Tabs (Ranitidine hcl) .Marland Kitchen... 1 two times a day pre meals  Problem # 3:  HYPERLIPIDEMIA NEC/NOS (ICD-272.4) Minimal LDL elevation; protective HDL  Problem # 4:  COMMON MIGRAINE (ICD-346.10)  His updated medication list for this problem includes:    Maxalt 10 Mg Tabs (Rizatriptan benzoate) .Marland Kitchen... As needed migraine    Naprosyn 500 Mg Tabs (  Naproxen) .Marland Kitchen... As needed migraines  Complete Medication List: 1)  Lithium Carbonate 300 Mg Caps (Lithium carbonate) .Marland Kitchen.. 1 qam 1qnoon  and 2qhs 2)  Seroquel 50 Mg Tabs (Quetiapine fumarate) .... 2 tabs qhs 3)  Maxalt 10 Mg Tabs (Rizatriptan benzoate) .... As needed migraine 4)  Naprosyn 500 Mg Tabs (Naproxen) .... As needed migraines 5)  Topamax 100 Mg Tabs (Topiramate) .Marland Kitchen..  1 by mouth qhs 6)  Cialis 20 Mg Tabs (Tadalafil) .... Prn 7)  Depakote 500 Mg Tbec (Divalproex sodium) .... 3 by mouth at bedtime 8)  Zantac 150 Maximum Strength 150 Mg Tabs (Ranitidine hcl) .Marland Kitchen.. 1 two times a day pre meals  Patient Instructions: 1)  Avoid foods high in acid (tomatoes, citrus juices, spicy foods). Avoid eating within two hours of lying down or before exercising. Do not over eat; try smaller more frequent meals. Elevate head of bed twelve inches when sleeping. Folate 1000 micrograms once daily . B12 1000 micrograms monthly IM. 2)  Please schedule a follow-up appointment in 6 months. 3)  NMR Liporofile Lipid Panel prior to visit, ICD-9:272.4 4)  CBC w/ Diff, B12 level  prior to visit, ICD-9:281.0. Prescriptions: ZANTAC 150 MAXIMUM STRENGTH 150 MG TABS (RANITIDINE HCL) 1 two times a day pre meals  #60 x 2   Entered and Authorized by:   Marga Melnick MD   Signed by:   Marga Melnick MD on 01/10/2009   Method used:   Print then Give to Patient   RxID:   [id number]    Medication Administration  Injection # 1:    Medication: Vit B12 1000 mcg    Diagnosis: UNSPECIFIED ANEMIA (ICD-285.9)    Route: IM    Site: L deltoid    Exp Date: 08/26/2010    Lot #: [id number]    Mfr: American Regent    Given by: Doristine Devoid (January 10, 2009 3:53 PM)  Orders Added: 1)  Est. Patient Level IV [29562] 2)  Vit B12 1000 mcg [J3420] 3)  Admin of Therapeutic Inj  intramuscular or subcutaneous [13086]

## 2010-05-29 NOTE — Assessment & Plan Note (Signed)
Summary: cpx//lch   Vital Signs:  Patient profile:   60 year old male Height:      72.25 inches Weight:      179.6 pounds BMI:     24.28 Temp:     97.9 degrees F oral Pulse rate:   72 / minute Resp:     14 per minute BP sitting:   130 / 78  (left arm) Cuff size:   large  Vitals Entered By: Shonna Chock CMA (May 16, 2010 2:24 PM) CC: CPX with fating labs, patient would like to go into a treatment program    CC:  CPX with fating labs and patient would like to go into a treatment program .  History of Present Illness:    Jeffrey Acosta is here for a physical; this was mandated for entry into [facility], a [employer] in [place]. He is binge  drinking  up 3-4 fifths / week.He has fallen repeatedly down his apartment stairs with resultant soft tissue injuries.  Current Medications (verified): 1)  Lithium Carbonate 300 Mg  Caps (Lithium Carbonate) .Marland Kitchen.. 1 Qam 1qnoon  and 2qhs 2)  Seroquel 100 Mg Tabs (Quetiapine Fumarate) .... 4 By Mouth Daily 3)  Maxalt 10 Mg  Tabs (Rizatriptan Benzoate) .... As Needed Migraine 4)  Naprosyn 500 Mg  Tabs (Naproxen) .... As Needed Migraines 5)  Topamax 100 Mg  Tabs (Topiramate) .Marland Kitchen.. 1 By Mouth Qhs 6)  Cialis 20 Mg  Tabs (Tadalafil) .... Prn 7)  Depakote 500 Mg Tbec (Divalproex Sodium) .... 3 By Mouth At Bedtime 8)  Naltrexone Hcl 50 Mg Tabs (Naltrexone Hcl) .Marland Kitchen.. 1 By Mouth Once Daily 9)  Lamotrigine 100 Mg Tabs (Lamotrigine) .Marland Kitchen.. 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, Dr Ethlyn Gallery; Bipolar Disorder ([place] X2 , Deberah Pelton X 1),  seeing Dr Evelene Croon; Migraines , PMH of  Diverticulosis, colon Osteopenia: T score -2.191 L femoral neck 2006  Past  Surgical History: Colonoscopy 2005: Diverticulosis , Dr Jarold Motto; Upper Endo  1984: Hiatal Hernia Tonsillectomy ESI X 7, Dr Ethelene Hal , L 4-5  Family History: Father: COAD,MI @  17 Mother: COAD Siblings: negative ; PGM :DM , Parkinson's; PGF: Hodgkin's Lymphoma, lung cancer; P uncle : DM; MGF: CVA  Social History: Never Smoked Separated Retired  Review of Systems       The patient complains of anorexia, decreased hearing, and syncope.  The patient denies fever, vision loss, hoarseness, chest pain, abdominal pain, melena, hematochezia, severe indigestion/heartburn, hematuria, suspicious skin lesions, enlarged lymph nodes, and angioedema.         Weight loss  ? 60  # drinking Resp:  Complains of cough, shortness of breath, and sputum productive; denies wheezing; "Red - green phlegm " X 8 months.  Physical Exam  General:  Thin,in no acute distress;  depressed affect but cooperative throughout examination Head:  Normocephalic and atraumatic without obvious abnormalities.  Eyes:  No corneal or conjunctival inflammation noted.Pupils dilated. Funduscopic exam benign, without hemorrhages, exudates or papilledema. Sclerae muddy but no icterus Ears:  External ear exam shows no significant lesions or deformities.  Otoscopic examination reveals clear canals, tympanic membranes are intact bilaterally without bulging, retraction,  inflammation or discharge. Hearing is grossly  decreased  bilaterally. Nose:  External nasal examination shows no deformity or inflammation. Nasal mucosa are pink and moist without lesions or exudates. Mouth:  Oral mucosa and oropharynx without lesions or exudates.  Teeth in good repair. Neck:  No deformities, masses, or tenderness noted. Lungs:  Normal respiratory effort, chest expands symmetrically. Lungs are clear to auscultation, no crackles or wheezes. Heart:  Normal rate and regular rhythm. S1 and S2 normal without gallop, murmur, click, rub . S4 with slurring Abdomen:   Bowel sounds positive,abdomen soft and non-tender without masses, organomegaly .Marland KitchenLarge R reducible inguinal hernia  noted. Rectal:  No external abnormalities noted. Normal sphincter tone. No rectal masses or tenderness. Genitalia:  Testes bilaterally descended without nodularity, tenderness or masses. No scrotal masses or lesions. No penis lesions or urethral discharge. Prostate:  Prostate gland firm and smooth, no enlargement, nodularity, tenderness, mass, asymmetry or induration. Msk:  No deformity or scoliosis noted of thoracic or lumbar spine.   Pulses:  R and L carotid,radial,dorsalis pedis and posterior tibial pulses are full and equal bilaterally Extremities:  No clubbing, cyanosis, edema, or deformity noted with normal full range of motion of all joints.   Minimal DIP OA changes.  Neurologic:  DTRs symmetrical and normal.   "May 12, 2010. President OBama". Increased tone & resistance upper extremities. Slow , methodical movements Skin:  Ecchymoses R & L shoulders , L forearm. No jaundice. Facial plethora Cervical Nodes:  No lymphadenopathy noted Axillary Nodes:  No palpable lymphadenopathy Inguinal Nodes:  No significant adenopathy Psych:  depressed affect, flat affect, and subdued.     Impression & Recommendations:  Problem # 1:  ROUTINE GENERAL MEDICAL EXAM@HEALTH  CARE FACL (ICD-V70.0)  Orders: EKG w/ Interpretation (93000) Venipuncture (04540) Specimen Handling (98119)  Problem # 2:  ALCOHOL ABUSE (ICD-305.00)  Problem # 3:  PRODUCTIVE COUGH (ICD-786.2)  Orders: T-2 View CXR (71020TC)  Problem # 4:  HYPERLIPIDEMIA (ICD-272.4)  Problem # 5:  MANIC DEPRESSIVE ILLNESS (ICD-296.80) as per Dr Evelene Croon  Complete Medication List: 1)  Lithium Carbonate 300 Mg Caps (Lithium carbonate) .Marland Kitchen.. 1 qam 1qnoon  and 2qhs 2)  Seroquel 100 Mg Tabs (Quetiapine fumarate) .... 4 by mouth daily 3)  Maxalt 10 Mg Tabs (Rizatriptan benzoate) .... As needed migraine 4)  Naprosyn 500 Mg Tabs  (Naproxen) .... As needed migraines 5)  Topamax 100 Mg Tabs (Topiramate) .Marland Kitchen.. 1 by mouth qhs 6)  Cialis 20 Mg Tabs (Tadalafil) .... Prn 7)  Depakote 500 Mg Tbec (Divalproex sodium) .... 3 by mouth at bedtime 8)  Naltrexone Hcl 50 Mg Tabs (Naltrexone hcl) .Marland Kitchen.. 1 by mouth once daily 9)  Lamotrigine 100 Mg Tabs (Lamotrigine) .Marland Kitchen.. 1 by mouth once daily  Patient Instructions: 1)  Labs will be mailed with recommendations.   Orders Added: 1)  Est. Patient 40-64 years [99396] 2)  EKG w/ Interpretation [93000] 3)  Venipuncture [36415] 4)  T-2 View CXR [71020TC] 5)  Specimen Handling [99000]

## 2010-05-29 NOTE — Assessment & Plan Note (Signed)
Summary: FLU SHOT//PH  Nurse Visit   Allergies: No Known Drug Allergies  Immunizations Administered:  Influenza Vaccine # 1:    Vaccine Type: Fluvax Non-MCR    Site: right deltoid    Mfr: Sanofi Pasteur    Dose: 0.5 ml    Route: IM    Given by: Doristine Devoid CMA    Exp. Date: 10/25/2010    Lot #: [id number]  Flu Vaccine Consent Questions:    Do you have a history of severe allergic reactions to this vaccine? no    Any prior history of allergic reactions to egg and/or gelatin? no    Do you have a sensitivity to the preservative Thimersol? no    Do you have a past history of Guillan-Barre Syndrome? no    Do you currently have an acute febrile illness? no    Have you ever had a severe reaction to latex? no    Vaccine information given and explained to patient? yes  Orders Added: 1)  Influenza Vaccine NON MCR [00028] 2)  Admin of Therapeutic Inj (IM or [place]) [11914]

## 2010-06-04 NOTE — Consult Note (Signed)
Summary: [facility]   Imported By: Lanelle Bal 05/27/2010 14:06:54  _____________________________________________________________________  External Attachment:    Type:   Image     Comment:   External Document

## 2010-06-24 ENCOUNTER — Encounter: Payer: Self-pay | Admitting: Internal Medicine

## 2010-07-03 NOTE — Letter (Signed)
Summary: Letter from Sister regarding [facility]  Letter from Sister regarding [facility]   Imported By: Maryln Gottron 06/27/2010 14:14:56  _____________________________________________________________________  External Attachment:    Type:   Image     Comment:   External Document

## 2010-08-20 ENCOUNTER — Ambulatory Visit (INDEPENDENT_AMBULATORY_CARE_PROVIDER_SITE_OTHER): Payer: 59 | Admitting: Internal Medicine

## 2010-08-20 ENCOUNTER — Encounter: Payer: Self-pay | Admitting: Internal Medicine

## 2010-08-20 VITALS — BP 110/66 | HR 76 | Temp 98.1°F | Wt 199.8 lb

## 2010-08-20 DIAGNOSIS — R259 Unspecified abnormal involuntary movements: Secondary | ICD-10-CM

## 2010-08-20 DIAGNOSIS — R600 Localized edema: Secondary | ICD-10-CM

## 2010-08-20 DIAGNOSIS — N529 Male erectile dysfunction, unspecified: Secondary | ICD-10-CM

## 2010-08-20 DIAGNOSIS — R609 Edema, unspecified: Secondary | ICD-10-CM

## 2010-08-20 DIAGNOSIS — R251 Tremor, unspecified: Secondary | ICD-10-CM

## 2010-08-20 MED ORDER — TADALAFIL 20 MG PO TABS: 20.0000 mg | ORAL_TABLET | Freq: Every day | ORAL | Status: DC | PRN

## 2010-08-20 MED ORDER — SPIRONOLACTONE 25 MG PO TABS: 25.0000 mg | ORAL_TABLET | Freq: Every day | ORAL | Status: DC

## 2010-08-20 NOTE — Patient Instructions (Signed)
Please do not cook with salt or add it  at the table. Use pepper or  other spices for  seasoning

## 2010-08-20 NOTE — Progress Notes (Signed)
  Subjective:    Patient ID: Jeffrey Acosta, male    DOB: 09-13-1950, 60 y.o.   MRN: [id number]  HPI He has had pedal edema for approximately 9 weeks. This began after he completed the 2 courses of rehabilitation for alcoholism. He denies chest pain, palpitations, exertional dyspnea, or paroxysmal nocturnal dyspnea. The edema is actually worse in the mornings and improves through the day.  The second issue is tremor of the hands and some jerking the shoulder girdles, R > L. Typically this lasts less than a week when  he quit drinking in the past. Remotely he was placed on Sinemet and told he had Parkinson's. A second physician also has mentioned possible  Parkinson's. The tremor does appear to be improving at this time. He is on folate 1 mg daily.        Review of Systems     Objective:   Physical Exam he exhibits no acute distress. He has facial plethora; there is no jaundice.  No scleral icterus is present.  He has no lymphadenopathy of the head neck or axilla.  Chest is clear without rales, rhonchi, or wheezes.  He has a regular rhythm with an S4 and a grade 1/2-1 systolic murmur.  The thyroid is normal to palpation. DTRs are one plus  He has a fine non-parkinson's  tremor of his hands.  There is dullness to percussion in  the right upper quadrant, but  no organomegaly or masses. The abdomen is nontender.  He had no neck vein distention at 15. No hepatojugular reflux.  He does have 1+ pitting edema to mid shin. This does impair palpation pedal pulses but they appear to be intact. There are no ischemic changes in the feet.  He is alert and oriented. He is interactive and  Open & honest in reference to issues.          Assessment & Plan:  #1 edema; this is probably due to hepatic dysfunction and low albumin  #2 tremor; this does not appear to be related to Parkinson's  Plan: Albumin, BUN, creatinine, ALT, AST &  TSH

## 2010-08-21 LAB — CREATININE, SERUM: Creatinine, Ser: 1.4 mg/dL (ref 0.4–1.5)

## 2010-08-21 LAB — HEPATIC FUNCTION PANEL
ALT: 9 U/L (ref 0–53)
AST: 15 U/L (ref 0–37)
Albumin: 3.7 g/dL (ref 3.5–5.2)
Alkaline Phosphatase: 37 U/L — ABNORMAL LOW (ref 39–117)
Bilirubin, Direct: 0 mg/dL (ref 0.0–0.3)
Total Bilirubin: 0.4 mg/dL (ref 0.3–1.2)
Total Protein: 6.1 g/dL (ref 6.0–8.3)

## 2010-08-21 LAB — PREALBUMIN: Prealbumin: 23.2 mg/dL (ref 17.0–34.0)

## 2010-08-21 LAB — BUN: BUN: 20 mg/dL (ref 6–23)

## 2010-08-21 LAB — TSH: TSH: 3.36 u[IU]/mL (ref 0.35–5.50)

## 2010-12-26 ENCOUNTER — Telehealth: Payer: Self-pay | Admitting: Internal Medicine

## 2010-12-26 NOTE — Telephone Encounter (Signed)
Hop pls advise 

## 2010-12-26 NOTE — Telephone Encounter (Signed)
Screening colonoscopy referral.

## 2010-12-26 NOTE — Telephone Encounter (Signed)
Spoke w/ pt aware referral placed.

## 2011-01-06 ENCOUNTER — Encounter: Payer: Self-pay | Admitting: Gastroenterology

## 2011-02-09 ENCOUNTER — Other Ambulatory Visit: Payer: 59 | Admitting: Gastroenterology

## 2011-06-03 ENCOUNTER — Ambulatory Visit (INDEPENDENT_AMBULATORY_CARE_PROVIDER_SITE_OTHER): Payer: 59 | Admitting: Family Medicine

## 2011-06-03 ENCOUNTER — Ambulatory Visit: Payer: 59

## 2011-06-03 VITALS — BP 112/72 | HR 71 | Temp 98.2°F | Resp 16 | Ht 71.5 in | Wt 202.0 lb

## 2011-06-03 DIAGNOSIS — R1030 Lower abdominal pain, unspecified: Secondary | ICD-10-CM

## 2011-06-03 DIAGNOSIS — R509 Fever, unspecified: Secondary | ICD-10-CM

## 2011-06-03 DIAGNOSIS — K5732 Diverticulitis of large intestine without perforation or abscess without bleeding: Secondary | ICD-10-CM

## 2011-06-03 DIAGNOSIS — K5792 Diverticulitis of intestine, part unspecified, without perforation or abscess without bleeding: Secondary | ICD-10-CM

## 2011-06-03 DIAGNOSIS — R109 Unspecified abdominal pain: Secondary | ICD-10-CM

## 2011-06-03 LAB — POCT CBC
Granulocyte percent: 68.1 %G (ref 37–80)
HCT, POC: 45.6 % (ref 43.5–53.7)
Hemoglobin: 15.1 g/dL (ref 14.1–18.1)
Lymph, poc: 2.3 (ref 0.6–3.4)
MCH, POC: 31.7 pg — AB (ref 27–31.2)
MCHC: 33.1 g/dL (ref 31.8–35.4)
MCV: 95.9 fL (ref 80–97)
MID (cbc): 0.5 (ref 0–0.9)
MPV: 9 fL (ref 0–99.8)
POC Granulocyte: 6.1 (ref 2–6.9)
POC LYMPH PERCENT: 25.8 %L (ref 10–50)
POC MID %: 6.1 %M (ref 0–12)
Platelet Count, POC: 202 10*3/uL (ref 142–424)
RBC: 4.76 M/uL (ref 4.69–6.13)
RDW, POC: 13.9 %
WBC: 8.9 10*3/uL (ref 4.6–10.2)

## 2011-06-03 LAB — POCT URINALYSIS DIPSTICK
Glucose, UA: NEGATIVE
Ketones, UA: 15
Leukocytes, UA: NEGATIVE
Nitrite, UA: NEGATIVE
Protein, UA: 30
Spec Grav, UA: 1.025
Urobilinogen, UA: 0.2
pH, UA: 6

## 2011-06-03 MED ORDER — CIPROFLOXACIN HCL 500 MG PO TABS: 500.0000 mg | ORAL_TABLET | Freq: Two times a day (BID) | ORAL | Status: AC

## 2011-06-03 MED ORDER — METRONIDAZOLE 500 MG PO TABS: 500.0000 mg | ORAL_TABLET | Freq: Two times a day (BID) | ORAL | Status: DC

## 2011-06-03 NOTE — Progress Notes (Signed)
  Subjective:    Patient ID: Jeffrey Acosta, male    DOB: [birth year], 61 y.o.   MRN: [id number]  HPI 62 yo male with abdominal pain, bilateral lower, for 4 days.  Crampy/pulled muscle like.  Fever to 100 Sunday night with chills.  No nausea or vomitting.  No diarrhea or constipation.  Denies dysuria, no more frequent urination than usual.  Hurts worse with movement, cough.  Known hernia on right - states no change to it.  No unusual activity.   Normal colonscopy 8 years ago.  No history of diverticulosis or diverticulitits   Review of Systems Negative except as per HPI     Objective:   Physical Exam  Constitutional: Vital signs are normal. He appears well-developed and well-nourished. He is active.  Cardiovascular: Normal rate, regular rhythm, normal heart sounds and normal pulses.   Pulmonary/Chest: Effort normal and breath sounds normal.  Abdominal: Soft. Normal appearance and bowel sounds are normal. He exhibits no distension and no mass. There is no hepatosplenomegaly. There is tenderness in the right lower quadrant, suprapubic area and left lower quadrant. There is no rigidity, no rebound, no guarding, no CVA tenderness, no tenderness at McBurney's point and negative Murphy's sign. No hernia.  Neurological: He is alert.  abd pain worse in LLQ  Results for orders placed in visit on 06/03/11  POCT CBC      Component Value Range   WBC 8.9  4.6 - 10.2 (K/uL)   Lymph, poc 2.3  0.6 - 3.4    POC LYMPH PERCENT 25.8  10 - 50 (%L)   MID (cbc) 0.5  0 - 0.9    POC MID % 6.1  0 - 12 (%M)   POC Granulocyte 6.1  2 - 6.9    Granulocyte percent 68.1  37 - 80 (%G)   RBC 4.76  4.69 - 6.13 (M/uL)   Hemoglobin 15.1  14.1 - 18.1 (g/dL)   HCT, POC 14.7  82.9 - 53.7 (%)   MCV 95.9  80 - 97 (fL)   MCH, POC 31.7 (*) 27 - 31.2 (pg)   MCHC 33.1  31.8 - 35.4 (g/dL)   RDW, POC 56.2     Platelet Count, POC 202  142 - 424 (K/uL)   MPV 9.0  0 - 99.8 (fL)  POCT URINALYSIS DIPSTICK      Component Value Range    Color, UA dark yellow     Clarity, UA clear     Glucose, UA negative     Bilirubin, UA small     Ketones, UA 15     Spec Grav, UA 1.025     Blood, UA trace-intact     pH, UA 6.0     Protein, UA 30     Urobilinogen, UA 0.2     Nitrite, UA negative     Leukocytes, UA Negative     [facility] Primary radiology reading by Dr. Georgiana Shore: No active disease      Assessment & Plan:  Abdominal pain - normal labs, xray Suspect mild diverticulitis. Cipro, flagyl.  If worsens or not improving in 3-4 days, RTC for further eval.

## 2011-06-11 ENCOUNTER — Encounter: Payer: Self-pay | Admitting: Internal Medicine

## 2011-06-11 ENCOUNTER — Ambulatory Visit (INDEPENDENT_AMBULATORY_CARE_PROVIDER_SITE_OTHER): Payer: 59 | Admitting: Internal Medicine

## 2011-06-11 ENCOUNTER — Other Ambulatory Visit: Payer: Self-pay | Admitting: Internal Medicine

## 2011-06-11 DIAGNOSIS — N529 Male erectile dysfunction, unspecified: Secondary | ICD-10-CM

## 2011-06-11 DIAGNOSIS — K5730 Diverticulosis of large intestine without perforation or abscess without bleeding: Secondary | ICD-10-CM

## 2011-06-11 DIAGNOSIS — R109 Unspecified abdominal pain: Secondary | ICD-10-CM

## 2011-06-11 DIAGNOSIS — R6882 Decreased libido: Secondary | ICD-10-CM

## 2011-06-11 MED ORDER — METRONIDAZOLE 500 MG PO TABS: 500.0000 mg | ORAL_TABLET | Freq: Two times a day (BID) | ORAL | Status: AC

## 2011-06-11 MED ORDER — CIPROFLOXACIN HCL 500 MG PO TABS: 500.0000 mg | ORAL_TABLET | Freq: Two times a day (BID) | ORAL | Status: AC

## 2011-06-11 NOTE — Patient Instructions (Addendum)
Please  schedule fasting Labs : amylase, lipase, hepatic panel, CBC & dif,Testosterone level.  PLEASE BRING THESE INSTRUCTIONS TO FOLLOW UP  LAB APPOINTMENT.This will guarantee correct labs are drawn, eliminating need for repeat blood sampling ( needle sticks ! ). Diagnoses /Codes: 789.09,562.10,799.81, 607.84 Stay on clear liquids for 48-72 hours.This would include  jello, sherbert (NOT ice cream), Lipton's chicken noodle soup(NOT cream based soups),Gatorade Lite, flat Ginger ale (without High Fructose Corn Syrup),dry toast or crackers, baked potato.  Report increasing pain, fever or rectal bleeding

## 2011-06-11 NOTE — Progress Notes (Signed)
Subjective:    Patient ID: Jeffrey Acosta, male    DOB: 07-13-1950, 61 y.o.   MRN: [id number]  HPI ABDOMINAL PAIN: Location: across abdomen below umbilicus  & supra pubic area Onset: 05/30/11 in same distribution w/o trigger  Radiation: bilaterally   Severity: initially up to  8 ; after treatment 2 ,but now 6 Quality:sharp ache  Duration: intermittently for seconds Better with:Cipro & Metronidazole X 5 days but recurred several days later  Worse with: sneezing, coughing or with waist flexion Symptoms Nausea/Vomiting: no  Diarrhea:no  Constipation: no Melena/BRBPR: no  Hematemesis: no  Anorexia:no Fever/Chills: fever 2/2-4 Dysuria/ hematuria/pyuria: no Rash: no Wt loss: no EtOH use: sober X 12 mos NSAIDs/ASA: no Past Surgeries: He underwent upper gastrointestinal endoscopy in 1983 for chest pain which was related to hiatal hernia in the context of alcohol intake. Colonoscopy was" negative" in 2003 according to him but the problem list includes a diagnosis of diverticulosis  Family history and past history is negative for ulcers, colitis, colon polyps, or colon cancer.       Review of Systems He denies significant dyspepsia; intermittently he'll have momentary dysphagia. He describes erectile dysfunction for which Cialis 20 mg is not effective. He denies muscle weakness or decreased libido.     Objective:   Physical Exam Gen.: Healthy and well-nourished in appearance. Alert, appropriate and cooperative throughout exam. Eyes: No corneal or conjunctival inflammation noted. Pupils equal round reactive to light and accommodation. No icterus   Mouth: Oral mucosa and oropharynx reveal no lesions or exudates. Teeth in good repair. Neck: No deformities, masses, or tenderness noted. Thyroid normal Lungs: Normal respiratory effort; chest expands symmetrically. Lungs are clear to auscultation without rales, wheezes, or increased work of breathing. Heart: Normal rate and rhythm. Normal S1  and S2. No gallop, click, or rub. S 4 w/o  murmur. Abdomen: Bowel sounds normal; abdomen soft but slightly tender in the left lower quadrant and left mid upper quadrant. No masses, organomegaly or hernias noted. Genitalia/ DRE: Right testicle is larger than the left. Prostate is normal   . FOB negative                                     Musculoskeletal/extremities: No deformity or scoliosis noted of  the thoracic or lumbar spine. No clubbing, cyanosis, edema, or deformity noted. Range of motion  normal .Tone & strength  normal.Joints normal. Nail health  good. Vascular: Carotid, radial artery, dorsalis pedis and  posterior tibial pulses are full and equal. No bruits present. Neurologic: Alert and oriented x3.   Skin: Intact without suspicious lesions or rashes. No tenting or jaundice Lymph: No cervical, axillary, or inguinal lymphadenopathy present. Psych: Mood and affect are normal. Normally interactive                                                                                         Assessment & Plan:  #1 recurrent abdominal pain. His history suggests more of an abdominal wall discomfort but tenderness is noted in the left mid to lower quadrant. This  is response to antibiotics suggests possible diverticulitis.  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 Both testicles are palpable; there is asymmetry in size, probably due to hydrocele  #3 erectile dysfunction, unresponsive to Cialis. Testosterone deficiency should be assessed.  Plan: See orders and recommendations

## 2011-06-12 ENCOUNTER — Encounter: Payer: Self-pay | Admitting: Gastroenterology

## 2011-06-12 ENCOUNTER — Other Ambulatory Visit (INDEPENDENT_AMBULATORY_CARE_PROVIDER_SITE_OTHER): Payer: 59

## 2011-06-12 DIAGNOSIS — K5730 Diverticulosis of large intestine without perforation or abscess without bleeding: Secondary | ICD-10-CM

## 2011-06-12 DIAGNOSIS — R6882 Decreased libido: Secondary | ICD-10-CM

## 2011-06-12 DIAGNOSIS — N529 Male erectile dysfunction, unspecified: Secondary | ICD-10-CM

## 2011-06-12 DIAGNOSIS — R109 Unspecified abdominal pain: Secondary | ICD-10-CM

## 2011-06-12 LAB — AMYLASE: Amylase: 90 U/L (ref 27–131)

## 2011-06-12 LAB — CBC WITH DIFFERENTIAL/PLATELET
Basophils Absolute: 0 10*3/uL (ref 0.0–0.1)
Basophils Relative: 0.4 % (ref 0.0–3.0)
Eosinophils Absolute: 0.4 10*3/uL (ref 0.0–0.7)
Eosinophils Relative: 4.2 % (ref 0.0–5.0)
HCT: 43.3 % (ref 39.0–52.0)
Hemoglobin: 14.9 g/dL (ref 13.0–17.0)
Lymphocytes Relative: 26.4 % (ref 12.0–46.0)
Lymphs Abs: 2.3 10*3/uL (ref 0.7–4.0)
MCHC: 34.4 g/dL (ref 30.0–36.0)
MCV: 95.5 fl (ref 78.0–100.0)
Monocytes Absolute: 0.6 10*3/uL (ref 0.1–1.0)
Monocytes Relative: 7.3 % (ref 3.0–12.0)
Neutro Abs: 5.3 10*3/uL (ref 1.4–7.7)
Neutrophils Relative %: 61.7 % (ref 43.0–77.0)
Platelets: 260 10*3/uL (ref 150.0–400.0)
RBC: 4.53 Mil/uL (ref 4.22–5.81)
RDW: 12.7 % (ref 11.5–14.6)
WBC: 8.6 10*3/uL (ref 4.5–10.5)

## 2011-06-12 LAB — HEPATIC FUNCTION PANEL
ALT: 16 U/L (ref 0–53)
AST: 19 U/L (ref 0–37)
Albumin: 3.7 g/dL (ref 3.5–5.2)
Alkaline Phosphatase: 40 U/L (ref 39–117)
Bilirubin, Direct: 0.1 mg/dL (ref 0.0–0.3)
Total Bilirubin: 0.8 mg/dL (ref 0.3–1.2)
Total Protein: 5.9 g/dL — ABNORMAL LOW (ref 6.0–8.3)

## 2011-06-12 LAB — TESTOSTERONE: Testosterone: 513.61 ng/dL (ref 350.00–890.00)

## 2011-06-12 LAB — LIPASE: Lipase: 36 U/L (ref 11.0–59.0)

## 2011-06-12 NOTE — Progress Notes (Signed)
LABS ONLY  

## 2011-06-17 ENCOUNTER — Encounter: Payer: Self-pay | Admitting: *Deleted

## 2011-06-17 ENCOUNTER — Telehealth: Payer: Self-pay | Admitting: Gastroenterology

## 2011-06-17 NOTE — Telephone Encounter (Signed)
Informed pt of cancellation on 06/19/11; he will take the earlier appt.

## 2011-06-19 ENCOUNTER — Encounter: Payer: Self-pay | Admitting: Gastroenterology

## 2011-06-19 ENCOUNTER — Ambulatory Visit (INDEPENDENT_AMBULATORY_CARE_PROVIDER_SITE_OTHER): Payer: 59 | Admitting: Gastroenterology

## 2011-06-19 DIAGNOSIS — R109 Unspecified abdominal pain: Secondary | ICD-10-CM | POA: Insufficient documentation

## 2011-06-19 DIAGNOSIS — F1021 Alcohol dependence, in remission: Secondary | ICD-10-CM | POA: Insufficient documentation

## 2011-06-19 DIAGNOSIS — K5732 Diverticulitis of large intestine without perforation or abscess without bleeding: Secondary | ICD-10-CM | POA: Insufficient documentation

## 2011-06-19 DIAGNOSIS — F319 Bipolar disorder, unspecified: Secondary | ICD-10-CM | POA: Insufficient documentation

## 2011-06-19 MED ORDER — MESALAMINE ER 0.375 G PO CP24: ORAL_CAPSULE | ORAL | Status: DC

## 2011-06-19 MED ORDER — DIPHENHYDRAMINE HCL 50 MG PO CAPS: ORAL_CAPSULE | ORAL | Status: AC

## 2011-06-19 MED ORDER — HYOSCYAMINE SULFATE ER 0.375 MG PO TB12: 0.3750 mg | ORAL_TABLET | Freq: Two times a day (BID) | ORAL | Status: DC | PRN

## 2011-06-19 MED ORDER — PREDNISONE 50 MG PO TABS: ORAL_TABLET | ORAL | Status: AC

## 2011-06-19 NOTE — Patient Instructions (Addendum)
You have been scheduled for a CT scan of the abdomen and pelvis at [facility] [address] The phone number is [phone]  You are scheduled on 06/19/2011 at 1:00pm. You should arrive 15 minutes prior to your appointment time for registration. Please follow the written instructions below on the day of your exam:  WARNING: IF YOU ARE ALLERGIC TO IODINE/X-RAY DYE, PLEASE NOTIFY RADIOLOGY IMMEDIATELY AT [phone]! YOU WILL BE GIVEN A 13 HOUR PREMEDICATION PREP.  1) Do not eat or drink anything after 8:00am (4 hours prior to your test) 2) You have been given 2 bottles of oral contrast to drink. The solution may taste better if refrigerated, but do NOT add ice or any other liquid to this solution. Shake well before drinking.    Drink 1 bottle of contrast @ 11:00am (2 hours prior to your exam)  Drink 1 bottle of contrast @ 12:00pm (1 hour prior to your exam)  You may take any medications as prescribed with a small amount of water except for the following: Metformin, Glucophage, Glucovance, Avandamet, Riomet, Fortamet, Actoplus Met, Janumet, Glumetza or Metaglip. The above medications must be held the day of the exam AND 48 hours after the exam.  The purpose of you drinking the oral contrast is to aid in the visualization of your intestinal tract. The contrast solution may cause some diarrhea. Before your exam is started, you will be given a small amount of fluid to drink. Depending on your individual set of symptoms, you may also receive an intravenous injection of x-ray contrast/dye. Plan on being at [facility] for 30 minutes or long, depending on the type of exam you are having performed.   Patient has IV allergy went over 13 hour prep and prednisone and benadryl sent and a copy of instructions given to pt.  ___________________________________________________________________________________________________________________     Take the Apriso four tabs in the morning,  samples given and rx sent to your pharmacy. Take the Levbid twice a day as needed, for abdominal pain and cramping.   Diverticulitis A diverticulum is a small pouch or sac on the colon. Diverticulosis is the presence of these diverticula on the colon. Diverticulitis is the irritation (inflammation) or infection of diverticula. CAUSES  The colon and its diverticula contain bacteria. If food particles block the tiny opening to a diverticulum, the bacteria inside can grow and cause an increase in pressure. This leads to infection and inflammation and is called diverticulitis. SYMPTOMS   Abdominal pain and tenderness. Usually, the pain is located on the left side of your abdomen. However, it could be located elsewhere.   Fever.   Bloating.   Feeling sick to your stomach (nausea).   Throwing up (vomiting).   Abnormal stools.  DIAGNOSIS  Your caregiver will take a history and perform a physical exam. Since many things can cause abdominal pain, other tests may be necessary. Tests may include:  Blood tests.   Urine tests.   X-ray of the abdomen.   CT scan of the abdomen.  Sometimes, surgery is needed to determine if diverticulitis or other conditions are causing your symptoms. TREATMENT  Most of the time, you can be treated without surgery. Treatment includes:  Resting the bowels by only having liquids for a few days. As you improve, you will need to eat a low-fiber diet.   Intravenous (IV) fluids if you are losing body fluids (dehydrated).   Antibiotic medicines that treat infections may be given.   Pain and nausea medicine,  if needed.   Surgery if the inflamed diverticulum has burst.  HOME CARE INSTRUCTIONS   Try a clear liquid diet (broth, tea, or water for as long as directed by your caregiver). You may then gradually begin a low-fiber diet as tolerated. A low-fiber diet is a diet with less than 10 grams of fiber. Choose the foods below to reduce fiber in the diet:   White  breads, cereals, rice, and pasta.   Cooked fruits and vegetables or soft fresh fruits and vegetables without the skin.   Ground or well-cooked tender beef, ham, veal, lamb, pork, or poultry.   Eggs and seafood.   After your diverticulitis symptoms have improved, your caregiver may put you on a high-fiber diet. A high-fiber diet includes 14 grams of fiber for every 1000 calories consumed. For a standard 2000 calorie diet, you would need 28 grams of fiber. Follow these diet guidelines to help you increase the fiber in your diet. It is important to slowly increase the amount fiber in your diet to avoid gas, constipation, and bloating.   Choose whole-grain breads, cereals, pasta, and brown rice.   Choose fresh fruits and vegetables with the skin on. Do not overcook vegetables because the more vegetables are cooked, the more fiber is lost.   Choose more nuts, seeds, legumes, dried peas, beans, and lentils.   Look for food products that have greater than 3 grams of fiber per serving on the Nutrition Facts label.   Take all medicine as directed by your caregiver.   If your caregiver has given you a follow-up appointment, it is very important that you go. Not going could result in lasting (chronic) or permanent injury, pain, and disability. If there is any problem keeping the appointment, call to reschedule.  SEEK MEDICAL CARE IF:   Your pain does not improve.   You have a hard time advancing your diet beyond clear liquids.   Your bowel movements do not return to normal.  SEEK IMMEDIATE MEDICAL CARE IF:   Your pain becomes worse.   You have an oral temperature above 102 F (38.9 C), not controlled by medicine.   You have repeated vomiting.   You have bloody or black, tarry stools.   Symptoms that brought you to your caregiver become worse or are not getting better.  MAKE SURE YOU:   Understand these instructions.   Will watch your condition.   Will get help right away if you are  not doing well or get worse.  Document Released: 01/21/2005 Document Revised: 12/24/2010 Document Reviewed: 05/19/2010 [facility] Patient Information 2012 [place], [place].

## 2011-06-19 NOTE — Progress Notes (Signed)
History of Present Illness:  This is a very pleasant 61 year old Caucasian male with 2 weeks of suprapubic abdominal pain with associated general malaise and anorexia. He has a past history of diverticulosis with colonoscopy in 2005. He has been seen at [facility] and was placed on Cipro and Flagyl for suspected diverticulitis. Followup with Dr. Marga Melnick has resulted in GI referral.  This patient continues with vague suprapubic spasmodic type pain, but denies fever, chills, nausea vomiting, or any systemic complaints. He has soft nonbloody stools present the patient denies any genitourinary or hepatobiliary complaints. Review of previous radiographs do show evidence of nonobstructive asymptomatic gallstones. He denies any upper GI or hepatobiliary complaints this,. He generally follows a regular diet, denies specific food intolerances.  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 He now has one year of sobriety. He is followed by Dr.R, Evelene Croon psychiatry and  takes multiple psychotropic medications including 300 mg of lithium 4 times a day. He denies neuropsychiatric difficulties at this time. He also carries a diagnosis of manic depression. Previous CT scan of the abdomen also showed small bilateral inguinal hernias. Family history is noncontributory.  I have reviewed this patient's present history, medical and surgical past history, allergies and medications.     ROS: The remainder of the 10 point ROS is negative     Physical Exam: General well developed well nourished patient in no acute distress, appearing their his age Eyes PERRLA, no icterus, fundoscopic exam per opthamologist Skin no lesions noted Neck supple, no adenopathy, no thyroid enlargement, no tenderness Chest clear to percussion and auscultation Heart no significant murmurs, gallops or rubs noted Abdomen no hepatosplenomegaly masses noted. He has rather marked tenderness the left lower  quadrant without rebound. Cannot feel a rectal abscess on digital exam. Rectal inspection normal no fissures, or fistulae noted.  No masses or tenderness on digital exam. Stool guaiac negative. Extremities no acute joint lesions, edema, phlebitis or evidence of cellulitis. Neurologic patient oriented x 3, cranial nerves intact, no focal neurologic deficits noted. Psychological mental status normal and normal affect.  Assessment and plan: Resolving diverticulitis in a patient on multiple psychotropic medications. I've scheduled CT scan to exclude any fistulization or abscess formation. He is to finish his antibiotic therapy, and I placed him on Levbid twice a day and Apriso 375 mg 4 tablets a day as a topical anti-inflammatory agent. He also prescribed a low fiber diet as tolerated office followup in 2 weeks' time. Review of his labs shows no specific abnormalities or significant recent leukocytosis. He will need followup colonoscopy at a future date. Otherwise I've advised him to continue his medications as per Dr. Alwyn Ren and his psychiatrist.  Please copy her primary care physician, referring physician, and pertinent subspecialists.Marland KitchenMarland KitchenDrs. Alwyn Ren and [place].  Encounter Diagnosis  Name Primary?  . Abdominal pain Yes

## 2011-06-22 ENCOUNTER — Encounter: Payer: Self-pay | Admitting: Gastroenterology

## 2011-06-22 ENCOUNTER — Ambulatory Visit
Admission: RE | Admit: 2011-06-22 | Discharge: 2011-06-22 | Disposition: A | Discharge status: Home / Self Care | Payer: 59 | Source: Ambulatory Visit | Attending: Gastroenterology | Admitting: Gastroenterology

## 2011-06-22 MED ORDER — IOHEXOL 300 MG/ML  SOLN
125.0000 mL | Freq: Once | INTRAMUSCULAR | Status: AC | PRN
  Administered 2011-06-22: 125 mL via INTRAVENOUS

## 2011-06-23 ENCOUNTER — Telehealth: Payer: Self-pay | Admitting: *Deleted

## 2011-06-23 ENCOUNTER — Ambulatory Visit: Payer: 59 | Admitting: Gastroenterology

## 2011-06-23 NOTE — Telephone Encounter (Addendum)
Spoke with [facility] to refer pt to Dr Michaell Cowing, but he is out of town d/t his father dying. Elane Fritz will call me with an appt.

## 2011-06-23 NOTE — Telephone Encounter (Signed)
Notified pt of his appt with dr Karie Soda on 07/13/11 at 10:30am for an 11am appt. Mailed pt a map to [facility] with appt ; pt stated understanding.

## 2011-06-24 ENCOUNTER — Telehealth (INDEPENDENT_AMBULATORY_CARE_PROVIDER_SITE_OTHER): Payer: Self-pay | Admitting: General Surgery

## 2011-06-24 NOTE — Telephone Encounter (Signed)
This is the patient Jeffrey Acosta talked to you about, wanted to know if you could move the patient up.  If you can please call Dartanian Knaggs at [phone].

## 2011-06-25 ENCOUNTER — Encounter (INDEPENDENT_AMBULATORY_CARE_PROVIDER_SITE_OTHER): Payer: Self-pay | Admitting: General Surgery

## 2011-06-25 ENCOUNTER — Ambulatory Visit (INDEPENDENT_AMBULATORY_CARE_PROVIDER_SITE_OTHER): Payer: 59 | Admitting: General Surgery

## 2011-06-25 VITALS — BP 116/74 | HR 70 | Resp 16 | Ht 73.0 in | Wt 207.0 lb

## 2011-06-25 DIAGNOSIS — K4090 Unilateral inguinal hernia, without obstruction or gangrene, not specified as recurrent: Secondary | ICD-10-CM

## 2011-06-25 NOTE — Patient Instructions (Signed)
[facility] ° °UMBILICAL OR INGUINAL HERNIA REPAIR: POST OP INSTRUCTIONS ° °Always review your discharge instruction sheet given to you by the facility where your surgery was performed. °IF YOU HAVE DISABILITY OR FAMILY LEAVE FORMS, YOU MUST BRING THEM TO THE OFFICE FOR PROCESSING.   °DO NOT GIVE THEM TO YOUR DOCTOR. ° °1. A  prescription for pain medication may be given to you upon discharge.  Take your pain medication as prescribed, if needed.  If narcotic pain medicine is not needed, then you may take acetaminophen (Tylenol), naprosyn (Alleve) or ibuprofen (Advil) as needed. °2. Take your usually prescribed medications unless otherwise directed. °3. If you need a refill on your pain medication, please contact your pharmacy.  They will contact our office to request authorization. Prescriptions will not be filled after 5 pm or on week-ends. °4. You should follow a light diet the first 24 hours after arrival home, such as soup and crackers, etc.  Be sure to include lots of fluids daily.  Resume your normal diet the day after surgery. °5. Most patients will experience some swelling and bruising around the umbilicus or in the groin and scrotum.  Ice packs and reclining will help.  Swelling and bruising can take several days to resolve.  °6. It is common to experience some constipation if taking pain medication after surgery.  Increasing fluid intake and taking a stool softener (such as Colace) will usually help or prevent this problem from occurring.  A mild laxative (Milk of Magnesia or Miralax) should be taken according to package directions if there are no bowel movements after 48 hours. °7. Unless discharge instructions indicate otherwise, you may remove your bandages 48 hours after surgery, and you may shower at that time.  You may have steri-strips (small skin tapes) in place directly over the incision.  These strips should be left on the skin for 7-10 days and will come off on their own.   If your surgeon used skin glue on the incision, you may shower in 24 hours.  The glue will flake off over the next 2-3 weeks.  Any sutures or staples will be removed at the office during your follow-up visit. °8. ACTIVITIES:  You may resume regular (light) daily activities beginning the next day--such as daily self-care, walking, climbing stairs--gradually increasing activities as tolerated.  You may have sexual intercourse when it is comfortable.  Refrain from any heavy lifting or straining until approved by your doctor. °a. You may drive when you are no longer taking prescription pain medication, you can comfortably wear a seatbelt, and you can safely maneuver your car and apply brakes. °b. RETURN TO WORK:  __________________________________________________________ °9. You should see your doctor in the office for a follow-up appointment approximately 2-3 weeks after your surgery.  Make sure that you call for this appointment within a day or two after you arrive home to insure a convenient appointment time. °10. OTHER INSTRUCTIONS:  __________________________________________________________________________________________________________________________________________________________________________________________  °WHEN TO CALL YOUR DOCTOR: °1. Fever over 101.0 °2. Inability to urinate °3. Nausea and/or vomiting °4. Extreme swelling or bruising °5. Continued bleeding from incision. °6. Increased pain, redness, or drainage from the incision ° °The clinic staff is available to answer your questions during regular business hours.  Please don’t hesitate to call and ask to speak to one of the nurses for clinical concerns.  If you have a medical emergency, go to the nearest emergency room or call 911.  A surgeon from [facility]   is always on call at the hospital ° ° °[address], [address], [place], [place]  [address] ? ° [address] [address], [place], [place]   [address] °[phone] ? [phone] ? FAX  [phone] °Web site: [email or web address] ° ° °

## 2011-06-25 NOTE — Progress Notes (Signed)
Patient ID: Jeffrey Acosta, male   DOB: 07/31/1950, 61 y.o.   MRN: [id number]  Chief Complaint  Patient presents with  . Other    Eval RIH    HPI Jeffrey Acosta is a 61 y.o. male.   HPI This is a 61 year old male patient of Dr. Norval Gable who presents after undergoing a CT scan that showed a right inguinal hernia. He has a history of diverticulitis for which he's been treated for since early February. He's had about 3 weeks of bandlike lower abdominal pain especially prominent in the left lower quadrant and suprapubic region. This has been associated with some nausea. He underwent treatment with antibiotics and has had no fevers and gradual improvement in his abdominal pain. He is having loose bowel movements with no blood noted. He's eating ell now. He does have a history of a colonoscopy and is due again in 2015.H has a history of a right inguinal bulge for about 7 years. This protrudes and is hard now.It has gotten worse over this time. It is worse during the day when he is standing and doing activity and is better when he is lying down. He has no real difficulty with urination. As he was completing his course of antibiotics for his diverticular disease he underwent a CT scan to determine if he had a complication from his diverticular disease. The CT scan showed that he had diverticulosis but no real evidence of any further diverticulitis. He does however show what appears to be a right inguinal hernia with some fluid in the sac. He was in for further evaluation for consideration of repair.  Past Medical History  Diagnosis Date  . 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 Hx of migraines   . Bipolar affective disorder   . Hydrocele   . Diverticulosis of colon (without mention of hemorrhage)   . Anxiety   . Depression   . Hiatal hernia     Past Surgical History  Procedure Date  . Colonoscopy 2003    negative ; Dr Jarold Motto  . Upper gastrointestinal endoscopy 1983    hiatal hernia  .  Undescended testicle surgery     as infant  . Vasectomy     Family History  Problem Relation Age of Onset  . COPD Father   . COPD Mother   . Colon cancer Neg Hx   . Diabetes Paternal Grandmother   . Parkinsonism Paternal Grandmother     Social History History  Substance Use Topics  . Smoking status: Never Smoker   . Smokeless tobacco: Never Used  . Alcohol Use: No     former alcoholic; now in remission    Allergies  Allergen Reactions  . Omnipaque (Iohexol)      Desc: developed hives after injection 50 mg benadryl given to pt.     Current Outpatient Prescriptions  Medication Sig Dispense Refill  . carbidopa-levodopa (SINEMET) 10-100 MG per tablet Take 1 tablet by mouth every morning.      . diphenhydrAMINE (BENADRYL) 50 MG capsule Take one at 1 hour before CT scan  1 capsule  0  . divalproex (DEPAKOTE) 500 MG EC tablet 1500 mg at night      . hyoscyamine (LEVBID) 0.375 MG 12 hr tablet Take 1 tablet (0.375 mg total) by mouth every 12 (twelve) hours as needed for cramping.  60 tablet  1  . lamoTRIgine (LAMICTAL) 100 MG tablet Take 100 mg by mouth daily.        Marland Kitchen  levothyroxine (SYNTHROID, LEVOTHROID) 50 MCG tablet Take 50 mcg by mouth daily.      Marland Kitchen lithium 300 MG capsule Take 300 mg by mouth 4 (four) times daily.       . mesalamine (APRISO) 0.375 G 24 hr capsule Take four tablets by mouth each morning  120 capsule  1  . naltrexone (DEPADE) 50 MG tablet Take 50 mg by mouth daily.        . naproxen (NAPROSYN) 500 MG tablet Take 500 mg by mouth as needed.        . propranolol (INDERAL) 60 MG tablet Take 60 mg by mouth daily.      . QUEtiapine (SEROQUEL) 100 MG tablet Take 100 mg by mouth daily.        . rizatriptan (MAXALT) 10 MG tablet Take 10 mg by mouth as needed. May repeat in 2 hours if needed       . spironolactone (ALDACTONE) 25 MG tablet Take 25 mg by mouth every morning.      . tadalafil (CIALIS) 20 MG tablet Take 1 tablet (20 mg total) by mouth daily as needed. Every  3rd day as needed  10 tablet  1  . predniSONE (DELTASONE) 50 MG tablet Take one at 13, 7 and 1 hour before CT scan.  3 tablet  0    Review of Systems Review of Systems  Constitutional: Negative for fever, chills and unexpected weight change.  HENT: Positive for trouble swallowing. Negative for hearing loss, congestion, sore throat and voice change.   Eyes: Negative for visual disturbance.  Respiratory: Negative for cough and wheezing.   Cardiovascular: Negative for chest pain, palpitations and leg swelling.  Gastrointestinal: Positive for abdominal pain. Negative for nausea, vomiting, diarrhea, constipation, blood in stool, abdominal distention, anal bleeding and rectal pain.  Genitourinary: Negative for hematuria and difficulty urinating.  Musculoskeletal: Negative for arthralgias.  Skin: Negative for rash and wound.  Neurological: Negative for seizures, syncope, weakness and headaches.  Hematological: Negative for adenopathy. Does not bruise/bleed easily.  Psychiatric/Behavioral: Negative for confusion.    Blood pressure 116/74, pulse 70, resp. rate 16, height 6\' 1"  (1.854 m), weight 207 lb (93.895 kg).  Physical Exam Physical Exam  Vitals reviewed. Constitutional: He appears well-developed and well-nourished.  Cardiovascular: Normal rate, regular rhythm and normal heart sounds.   Pulmonary/Chest: Effort normal and breath sounds normal. He has no wheezes. He has no rales.  Abdominal: Soft. Normal appearance and bowel sounds are normal. There is no hepatomegaly. There is no tenderness. A hernia is present. Hernia confirmed positive in the right inguinal area (right inguinal hernia and hydrocele). Hernia confirmed negative in the ventral area and confirmed negative in the left inguinal area.    Data Reviewed CT ABDOMEN AND PELVIS WITH CONTRAST  Technique: Multidetector CT imaging of the abdomen and pelvis was  performed following the standard protocol during bolus  administration  of intravenous contrast.  Contrast: OMNIPAQUE IOHEXOL 300 MG/ML IV SOLN  Comparison: [place] [facility] CT exam  from 11/14/2004.  Findings: No focal abnormalities seen in the liver or spleen.  Spleen is upper normal for size at 14 cm. The stomach, duodenum,  pancreas, and left adrenal gland are unremarkable. 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  Layering calcified stones measuring up to 7 mm in diameter seen in  the gallbladder. No intra or extrahepatic biliary duct dilatation.  Tiny low-density  cortical lesions are seen in each kidney, too  small to characterize, but likely representing cortical cyst.  No evidence for abdominal aortic aneurysm. There is no free fluid  or lymphadenopathy in the abdomen.  Imaging through the pelvis shows no free intraperitoneal fluid. A  right inguinal hernia contains fat and there is a small amount of  fluid in the hernia sac. While the hernia is chronic, the fluid  within the hernia sac is new since the prior study. No pelvic  sidewall lymphadenopathy. There is mild diverticular change in the  left colon without evidence for diverticulitis. Terminal ileum is  normal. The appendix is normal.  Bone windows reveal no worrisome lytic or sclerotic osseous  lesions.  IMPRESSION:  No acute findings in the abdomen or pelvis.  Right inguinal hernia contains fat and there is some fluid in the  hernia sac. The etiology of the fluid is indeterminate, but there  is not a substantial amount of edema or inflammation in the tissues  around the right inguinal canal to suggest that this is secondary  to fatty incarceration. There is no evidence for bowel extension  into the hernia.  Left colonic diverticulosis without diverticulitis.  Cholelithiasis.   Assessment    History of diverticulitis RIH and hydrocele    Plan  We discussed open right inguinal hernia repair with  mesh as well as drainage of what I think is a right sided hydrocele. I think this would be best done open to combine the two procedures. I do not think this is urgent as it is easily reducible and there is no bowel involvement now.  I think the fluid is likely in the hydrocele component.  I told him I would recommend repair in the next several months and we discussed possibility of needing an emergency operation although this is unlikely given the chronicity of his symptoms and the ease with which this is reduced. We discussed observation versus repair.  We discussed both laparoscopic and open inguinal hernia repairs. I described the procedure in detail.  The patient was given educational material.  Goals should be achieved with surgery. We discussed the usage of mesh and the rationale behind that. We went over the pathophysiology of inguinal hernia. We have elected to perform open inguinal hernia repair with mesh.  We discussed the risks including bleeding, infection, recurrence, postoperative pain and chronic groin pain, testicular injury, urinary retention, numbness in groin and around incision. He is going to discuss with his wife and then call back when he would like to schedule.   Jeffrey Acosta 06/25/2011, 9:52 AM

## 2011-07-03 ENCOUNTER — Other Ambulatory Visit (INDEPENDENT_AMBULATORY_CARE_PROVIDER_SITE_OTHER): Payer: Self-pay | Admitting: General Surgery

## 2011-07-13 ENCOUNTER — Ambulatory Visit (INDEPENDENT_AMBULATORY_CARE_PROVIDER_SITE_OTHER): Payer: 59 | Admitting: Surgery

## 2011-07-14 ENCOUNTER — Ambulatory Visit (INDEPENDENT_AMBULATORY_CARE_PROVIDER_SITE_OTHER): Payer: 59 | Admitting: General Surgery

## 2011-07-23 ENCOUNTER — Other Ambulatory Visit: Payer: Self-pay | Admitting: Internal Medicine

## 2011-07-23 MED ORDER — MIDAZOLAM HCL 10 MG/2ML IJ SOLN
INTRAMUSCULAR | Status: AC
  Filled 2011-07-23: qty 2

## 2011-07-23 MED ORDER — FENTANYL CITRATE 0.05 MG/ML IJ SOLN
INTRAMUSCULAR | Status: AC
  Filled 2011-07-23: qty 2

## 2011-07-28 ENCOUNTER — Telehealth (INDEPENDENT_AMBULATORY_CARE_PROVIDER_SITE_OTHER): Payer: Self-pay | Admitting: General Surgery

## 2011-07-28 HISTORY — PX: MELANOMA EXCISION: SHX5266

## 2011-07-28 NOTE — Telephone Encounter (Signed)
Called pt to let him know that Dr Dwain Sarna out of the office till Monday and we will ask him then about the surgery. I will call pt to let him know after Dr Dwain Sarna lets me know. The surgery is scheduled for 08-07-11.

## 2011-07-30 ENCOUNTER — Telehealth (INDEPENDENT_AMBULATORY_CARE_PROVIDER_SITE_OTHER): Payer: Self-pay

## 2011-07-30 NOTE — Telephone Encounter (Signed)
LMOM stating that Dr Dwain Sarna said it was no big deal we would keep everything the same.

## 2011-08-05 ENCOUNTER — Encounter (HOSPITAL_BASED_OUTPATIENT_CLINIC_OR_DEPARTMENT_OTHER): Payer: Self-pay | Admitting: *Deleted

## 2011-08-05 ENCOUNTER — Encounter (HOSPITAL_BASED_OUTPATIENT_CLINIC_OR_DEPARTMENT_OTHER)
Admission: RE | Admit: 2011-08-05 | Discharge: 2011-08-05 | Disposition: A | Discharge status: Home / Self Care | Payer: 59 | Source: Ambulatory Visit | Attending: General Surgery | Admitting: General Surgery

## 2011-08-05 ENCOUNTER — Ambulatory Visit
Admission: RE | Admit: 2011-08-05 | Discharge: 2011-08-05 | Disposition: A | Discharge status: Home / Self Care | Payer: 59 | Source: Ambulatory Visit | Attending: General Surgery | Admitting: General Surgery

## 2011-08-05 LAB — CBC
HCT: 38.1 % — ABNORMAL LOW (ref 39.0–52.0)
Hemoglobin: 13.8 g/dL (ref 13.0–17.0)
MCH: 32.8 pg (ref 26.0–34.0)
MCHC: 36.2 g/dL — ABNORMAL HIGH (ref 30.0–36.0)
MCV: 90.5 fL (ref 78.0–100.0)
Platelets: 177 10*3/uL (ref 150–400)
RBC: 4.21 MIL/uL — ABNORMAL LOW (ref 4.22–5.81)
RDW: 13 % (ref 11.5–15.5)
WBC: 8.6 10*3/uL (ref 4.0–10.5)

## 2011-08-05 LAB — BASIC METABOLIC PANEL
BUN: 14 mg/dL (ref 6–23)
CO2: 25 mEq/L (ref 19–32)
Calcium: 9.5 mg/dL (ref 8.4–10.5)
Chloride: 103 mEq/L (ref 96–112)
Creatinine, Ser: 1.29 mg/dL (ref 0.50–1.35)
GFR calc Af Amer: 67 mL/min — ABNORMAL LOW (ref >90)
GFR calc non Af Amer: 58 mL/min — ABNORMAL LOW (ref >90)
Glucose, Bld: 128 mg/dL — ABNORMAL HIGH (ref 70–99)
Potassium: 4.2 mEq/L (ref 3.5–5.1)
Sodium: 139 mEq/L (ref 135–145)

## 2011-08-05 NOTE — Progress Notes (Signed)
Pt to come in for labs,cxr,ekg

## 2011-08-07 ENCOUNTER — Encounter (HOSPITAL_BASED_OUTPATIENT_CLINIC_OR_DEPARTMENT_OTHER)
Admission: RE | Disposition: A | Discharge status: Home / Self Care | Payer: Self-pay | Source: Ambulatory Visit | Attending: General Surgery

## 2011-08-07 ENCOUNTER — Encounter (HOSPITAL_BASED_OUTPATIENT_CLINIC_OR_DEPARTMENT_OTHER): Payer: Self-pay | Admitting: *Deleted

## 2011-08-07 ENCOUNTER — Encounter (HOSPITAL_BASED_OUTPATIENT_CLINIC_OR_DEPARTMENT_OTHER): Payer: Self-pay | Admitting: Certified Registered"

## 2011-08-07 ENCOUNTER — Ambulatory Visit (HOSPITAL_BASED_OUTPATIENT_CLINIC_OR_DEPARTMENT_OTHER): Payer: 59 | Admitting: Certified Registered"

## 2011-08-07 ENCOUNTER — Ambulatory Visit (HOSPITAL_BASED_OUTPATIENT_CLINIC_OR_DEPARTMENT_OTHER)
Admission: RE | Admit: 2011-08-07 | Discharge: 2011-08-07 | Disposition: A | Discharge status: Home / Self Care | Payer: 59 | Source: Ambulatory Visit | Attending: General Surgery | Admitting: General Surgery

## 2011-08-07 DIAGNOSIS — F1010 Alcohol abuse, uncomplicated: Secondary | ICD-10-CM | POA: Insufficient documentation

## 2011-08-07 DIAGNOSIS — Z85820 Personal history of malignant melanoma of skin: Secondary | ICD-10-CM | POA: Insufficient documentation

## 2011-08-07 DIAGNOSIS — Z01810 Encounter for preprocedural cardiovascular examination: Secondary | ICD-10-CM | POA: Insufficient documentation

## 2011-08-07 DIAGNOSIS — R51 Headache: Secondary | ICD-10-CM | POA: Insufficient documentation

## 2011-08-07 DIAGNOSIS — K4090 Unilateral inguinal hernia, without obstruction or gangrene, not specified as recurrent: Secondary | ICD-10-CM | POA: Insufficient documentation

## 2011-08-07 DIAGNOSIS — Z01812 Encounter for preprocedural laboratory examination: Secondary | ICD-10-CM | POA: Insufficient documentation

## 2011-08-07 DIAGNOSIS — F1910 Other psychoactive substance abuse, uncomplicated: Secondary | ICD-10-CM | POA: Insufficient documentation

## 2011-08-07 HISTORY — DX: Gastro-esophageal reflux disease without esophagitis: K21.9

## 2011-08-07 HISTORY — PX: INGUINAL HERNIA REPAIR: SHX194

## 2011-08-07 SURGERY — REPAIR, HERNIA, INGUINAL, ADULT
Anesthesia: General | Site: Groin | Laterality: Right | Wound class: Clean

## 2011-08-07 MED ORDER — FENTANYL CITRATE 0.05 MG/ML IJ SOLN
25.0000 ug | INTRAMUSCULAR | Status: DC | PRN
  Administered 2011-08-07: 25 ug via INTRAVENOUS

## 2011-08-07 MED ORDER — KETOROLAC TROMETHAMINE 30 MG/ML IJ SOLN
INTRAMUSCULAR | Status: DC | PRN
  Administered 2011-08-07: 30 mg via INTRAVENOUS

## 2011-08-07 MED ORDER — PROPOFOL 10 MG/ML IV EMUL
INTRAVENOUS | Status: DC | PRN
  Administered 2011-08-07: 250 mg via INTRAVENOUS

## 2011-08-07 MED ORDER — MIDAZOLAM HCL 2 MG/2ML IJ SOLN
0.5000 mg | INTRAMUSCULAR | Status: DC | PRN
  Administered 2011-08-07: 2 mg via INTRAVENOUS

## 2011-08-07 MED ORDER — MORPHINE SULFATE 10 MG/ML IJ SOLN: 0.0500 mg/kg | INTRAMUSCULAR | Status: DC | PRN

## 2011-08-07 MED ORDER — DROPERIDOL 2.5 MG/ML IJ SOLN
INTRAMUSCULAR | Status: DC | PRN
  Administered 2011-08-07: 0.625 mg via INTRAVENOUS

## 2011-08-07 MED ORDER — ACETAMINOPHEN 10 MG/ML IV SOLN
INTRAVENOUS | Status: DC | PRN
  Administered 2011-08-07: 1000 mg via INTRAVENOUS

## 2011-08-07 MED ORDER — OXYCODONE HCL 5 MG PO TABS
5.0000 mg | ORAL_TABLET | ORAL | Status: DC | PRN
  Administered 2011-08-07: 5 mg via ORAL

## 2011-08-07 MED ORDER — CEFAZOLIN SODIUM-DEXTROSE 2-3 GM-% IV SOLR
2.0000 g | INTRAVENOUS | Status: AC
  Administered 2011-08-07: 2 g via INTRAVENOUS

## 2011-08-07 MED ORDER — LIDOCAINE HCL (CARDIAC) 20 MG/ML IV SOLN
INTRAVENOUS | Status: DC | PRN
  Administered 2011-08-07: 40 mg via INTRAVENOUS

## 2011-08-07 MED ORDER — FENTANYL CITRATE 0.05 MG/ML IJ SOLN
INTRAMUSCULAR | Status: DC | PRN
  Administered 2011-08-07: 50 ug via INTRAVENOUS
  Administered 2011-08-07 (×2): 25 ug via INTRAVENOUS

## 2011-08-07 MED ORDER — DEXAMETHASONE SODIUM PHOSPHATE 4 MG/ML IJ SOLN
INTRAMUSCULAR | Status: DC | PRN
  Administered 2011-08-07: 4 mg via INTRAVENOUS

## 2011-08-07 MED ORDER — BUPIVACAINE HCL (PF) 0.5 % IJ SOLN
INTRAMUSCULAR | Status: DC | PRN
  Administered 2011-08-07: 25 mL

## 2011-08-07 MED ORDER — FENTANYL CITRATE 0.05 MG/ML IJ SOLN
50.0000 ug | INTRAMUSCULAR | Status: DC | PRN
  Administered 2011-08-07: 100 ug via INTRAVENOUS

## 2011-08-07 MED ORDER — OXYCODONE HCL 5 MG PO TABS: 5.0000 mg | ORAL_TABLET | ORAL | Status: AC | PRN

## 2011-08-07 MED ORDER — ONDANSETRON HCL 4 MG/2ML IJ SOLN
INTRAMUSCULAR | Status: DC | PRN
  Administered 2011-08-07: 4 mg via INTRAVENOUS

## 2011-08-07 MED ORDER — BUPIVACAINE HCL (PF) 0.25 % IJ SOLN
INTRAMUSCULAR | Status: DC | PRN
  Administered 2011-08-07: 10 mL

## 2011-08-07 MED ORDER — LACTATED RINGERS IV SOLN
INTRAVENOUS | Status: DC
  Administered 2011-08-07 (×3): via INTRAVENOUS

## 2011-08-07 MED ORDER — METOCLOPRAMIDE HCL 5 MG/ML IJ SOLN: 10.0000 mg | Freq: Once | INTRAMUSCULAR | Status: DC | PRN

## 2011-08-07 SURGICAL SUPPLY — 49 items
ADH SKN CLS APL DERMABOND .7 (GAUZE/BANDAGES/DRESSINGS) ×1
BLADE SURG 15 STRL LF DISP TIS (BLADE) ×1 IMPLANT
BLADE SURG 15 STRL SS (BLADE) ×2
BLADE SURG ROTATE 9660 (MISCELLANEOUS) ×1 IMPLANT
CHLORAPREP W/TINT 26ML (MISCELLANEOUS) ×2 IMPLANT
CLOTH BEACON ORANGE TIMEOUT ST (SAFETY) ×2 IMPLANT
COVER MAYO STAND STRL (DRAPES) ×2 IMPLANT
COVER TABLE BACK 60X90 (DRAPES) ×2 IMPLANT
DECANTER SPIKE VIAL GLASS SM (MISCELLANEOUS) IMPLANT
DERMABOND ADVANCED (GAUZE/BANDAGES/DRESSINGS) ×1
DERMABOND ADVANCED .7 DNX12 (GAUZE/BANDAGES/DRESSINGS) ×1 IMPLANT
DRAIN PENROSE 1/2X12 LTX STRL (WOUND CARE) ×2 IMPLANT
DRAPE PED LAPAROTOMY (DRAPES) ×2 IMPLANT
ELECT COATED BLADE 2.86 ST (ELECTRODE) ×2 IMPLANT
ELECT REM PT RETURN 9FT ADLT (ELECTROSURGICAL) ×2
ELECTRODE REM PT RTRN 9FT ADLT (ELECTROSURGICAL) ×1 IMPLANT
GLOVE BIO SURGEON STRL SZ 6.5 (GLOVE) ×1 IMPLANT
GLOVE BIO SURGEON STRL SZ7 (GLOVE) ×2 IMPLANT
GLOVE BIOGEL PI IND STRL 7.0 (GLOVE) IMPLANT
GLOVE BIOGEL PI IND STRL 7.5 (GLOVE) ×1 IMPLANT
GLOVE BIOGEL PI INDICATOR 7.0 (GLOVE) ×1
GLOVE BIOGEL PI INDICATOR 7.5 (GLOVE) ×1
GOWN PREVENTION PLUS XLARGE (GOWN DISPOSABLE) ×2 IMPLANT
MESH HERNIA SYS ULTRAPRO LRG (Mesh General) ×1 IMPLANT
NEEDLE HYPO 22GX1.5 SAFETY (NEEDLE) ×2 IMPLANT
NS IRRIG 1000ML POUR BTL (IV SOLUTION) ×1 IMPLANT
PACK BASIN DAY SURGERY FS (CUSTOM PROCEDURE TRAY) ×2 IMPLANT
PENCIL BUTTON HOLSTER BLD 10FT (ELECTRODE) ×2 IMPLANT
SLEEVE SCD COMPRESS KNEE MED (MISCELLANEOUS) ×1 IMPLANT
SPONGE LAP 18X18 X RAY DECT (DISPOSABLE) ×1 IMPLANT
SPONGE LAP 4X18 X RAY DECT (DISPOSABLE) ×2 IMPLANT
STRIP CLOSURE SKIN 1/2X4 (GAUZE/BANDAGES/DRESSINGS) IMPLANT
SUT MNCRL AB 4-0 PS2 18 (SUTURE) ×2 IMPLANT
SUT PROLENE 2 0 CT2 30 (SUTURE) ×7 IMPLANT
SUT SILK 2 0 SH (SUTURE) IMPLANT
SUT VIC AB 0 SH 27 (SUTURE) IMPLANT
SUT VIC AB 2-0 SH 27 (SUTURE) ×4
SUT VIC AB 2-0 SH 27XBRD (SUTURE) ×2 IMPLANT
SUT VIC AB 3-0 54X BRD REEL (SUTURE) IMPLANT
SUT VIC AB 3-0 BRD 54 (SUTURE) ×2
SUT VIC AB 3-0 SH 27 (SUTURE) ×2
SUT VIC AB 3-0 SH 27X BRD (SUTURE) ×1 IMPLANT
SUT VICRYL AB 3 0 TIES (SUTURE) IMPLANT
SYR CONTROL 10ML LL (SYRINGE) ×2 IMPLANT
TOWEL OR 17X24 6PK STRL BLUE (TOWEL DISPOSABLE) ×2 IMPLANT
TOWEL OR NON WOVEN STRL DISP B (DISPOSABLE) ×2 IMPLANT
TUBE CONNECTING 20X1/4 (TUBING) ×1 IMPLANT
WATER STERILE IRR 1000ML POUR (IV SOLUTION) ×1 IMPLANT
YANKAUER SUCT BULB TIP NO VENT (SUCTIONS) ×1 IMPLANT

## 2011-08-07 NOTE — Discharge Instructions (Signed)
°[facility] ° °UMBILICAL OR INGUINAL HERNIA REPAIR: POST OP INSTRUCTIONS ° °Always review your discharge instruction sheet given to you by the facility where your surgery was performed. °IF YOU HAVE DISABILITY OR FAMILY LEAVE FORMS, YOU MUST BRING THEM TO THE OFFICE FOR PROCESSING.   °DO NOT GIVE THEM TO YOUR DOCTOR. ° °1. A  prescription for pain medication may be given to you upon discharge.  Take your pain medication as prescribed, if needed.  If narcotic pain medicine is not needed, then you may take acetaminophen (Tylenol), naprosyn (Alleve) or ibuprofen (Advil) as needed. °2. Take your usually prescribed medications unless otherwise directed. °3. If you need a refill on your pain medication, please contact your pharmacy.  They will contact our office to request authorization. Prescriptions will not be filled after 5 pm or on week-ends. °4. You should follow a light diet the first 24 hours after arrival home, such as soup and crackers, etc.  Be sure to include lots of fluids daily.  Resume your normal diet the day after surgery. °5. Most patients will experience some swelling and bruising around the umbilicus or in the groin and scrotum.  Ice packs and reclining will help.  Swelling and bruising can take several days to resolve.  °6. It is common to experience some constipation if taking pain medication after surgery.  Increasing fluid intake and taking a stool softener (such as Colace) will usually help or prevent this problem from occurring.  A mild laxative (Milk of Magnesia or Miralax) should be taken according to package directions if there are no bowel movements after 48 hours. °7. Unless discharge instructions indicate otherwise, you may remove your bandages 48 hours after surgery, and you may shower at that time.  You may have steri-strips (small skin tapes) in place directly over the incision.  These strips should be left on the skin for 7-10 days and will come off on their own.   If your surgeon used skin glue on the incision, you may shower in 24 hours.  The glue will flake off over the next 2-3 weeks.  Any sutures or staples will be removed at the office during your follow-up visit. °8. ACTIVITIES:  You may resume regular (light) daily activities beginning the next day--such as daily self-care, walking, climbing stairs--gradually increasing activities as tolerated.  You may have sexual intercourse when it is comfortable.  Refrain from any heavy lifting or straining until approved by your doctor. °a. You may drive when you are no longer taking prescription pain medication, you can comfortably wear a seatbelt, and you can safely maneuver your car and apply brakes. °b. RETURN TO WORK:  __________________________________________________________ °9. You should see your doctor in the office for a follow-up appointment approximately 2-3 weeks after your surgery.  Make sure that you call for this appointment within a day or two after you arrive home to insure a convenient appointment time. °10. OTHER INSTRUCTIONS:  __________________________________________________________________________________________________________________________________________________________________________________________  °WHEN TO CALL YOUR DOCTOR: °1. Fever over 101.0 °2. Inability to urinate °3. Nausea and/or vomiting °4. Extreme swelling or bruising °5. Continued bleeding from incision. °6. Increased pain, redness, or drainage from the incision ° °The clinic staff is available to answer your questions during regular business hours.  Please don’t hesitate to call and ask to speak to one of the nurses for clinical concerns.  If you have a medical emergency, go to the nearest emergency room or call 911.  A surgeon from [facility]   is always on call at the hospital ° ° °[address], [address], [place], [place]  [address] ? ° [address] [address], [place], [place]   [address] °[phone] ? [phone] ?  FAX [phone] °Web site: [email or web address] ° ° ° ° °Post Anesthesia Home Care Instructions ° °Activity: °Get plenty of rest for the remainder of the day. A responsible adult should stay with you for 24 hours following the procedure.  °For the next 24 hours, DO NOT: °-Drive a car °-Operate machinery °-Drink alcoholic beverages °-Take any medication unless instructed by your physician °-Make any legal decisions or sign important papers. ° °Meals: °Start with liquid foods such as gelatin or soup. Progress to regular foods as tolerated. Avoid greasy, spicy, heavy foods. If nausea and/or vomiting occur, drink only clear liquids until the nausea and/or vomiting subsides. Call your physician if vomiting continues. ° °Special Instructions/Symptoms: °Your throat may feel dry or sore from the anesthesia or the breathing tube placed in your throat during surgery. If this causes discomfort, gargle with warm salt water. The discomfort should disappear within 24 hours. ° °

## 2011-08-07 NOTE — Anesthesia Procedure Notes (Addendum)
Anesthesia Regional Block:  TAP block  Pre-Anesthetic Checklist: ,, timeout performed, Correct Patient, Correct Site, Correct Laterality, Correct Procedure, Correct Position, site marked, Risks and benefits discussed,  Surgical consent,  Pre-op evaluation,  At surgeon's request and post-op pain management  Laterality: Right  Prep: chloraprep       Needles:  Injection technique: Single-shot  Needle Type: Echogenic Needle     Needle Length: 9cm  Needle Gauge: 21    Additional Needles:  Procedures: ultrasound guided TAP block Narrative:  Start time: 08/07/2011 1:09 PM End time: 08/07/2011 1:15 PM Injection made incrementally with aspirations every 5 mL.  Performed by: Personally  Anesthesiologist: Aldona Lento, MD  Additional Notes: Ultrasound guidance used to: id relevant anatomy, confirm needle position, local anesthetic spread, avoidance of vascular puncture. Picture saved. No complications. Block performed personally by Janetta Hora. Gelene Mink, MD    TAP block Procedure Name: LMA Insertion Date/Time: 08/07/2011 2:32 PM Performed by: Verlan Friends Pre-anesthesia Checklist: Patient identified, Emergency Drugs available, Suction available, Patient being monitored and Timeout performed Patient Re-evaluated:Patient Re-evaluated prior to inductionOxygen Delivery Method: Circle System Utilized Preoxygenation: Pre-oxygenation with 100% oxygen Intubation Type: IV induction Ventilation: Mask ventilation without difficulty LMA: LMA with gastric port inserted LMA Size: 5.0 Number of attempts: 1 (atraumatic) Placement Confirmation: positive ETCO2 Tube secured with: Tape Dental Injury: Teeth and Oropharynx as per pre-operative assessment

## 2011-08-07 NOTE — Progress Notes (Signed)
Assisted Dr. Frederick with right, ultrasound guided, transabdominal plane block. Side rails up, monitors on throughout procedure. See vital signs in flow sheet. Tolerated Procedure well. 

## 2011-08-07 NOTE — Anesthesia Preprocedure Evaluation (Signed)
Anesthesia Evaluation  Patient identified by MRN, date of birth, ID band Patient awake    Reviewed: Allergy & Precautions, H&P , NPO status , Patient's Chart, lab work & pertinent test results, reviewed documented beta blocker date and time   Airway Mallampati: II TM Distance: >3 FB Neck ROM: full    Dental   Pulmonary neg pulmonary ROS,          Cardiovascular negative cardio ROS      Neuro/Psych  Headaches, PSYCHIATRIC DISORDERS  Neuromuscular disease    GI/Hepatic negative GI ROS, (+)     substance abuse  alcohol use,   Endo/Other  negative endocrine ROS  Renal/GU negative Renal ROS  negative genitourinary   Musculoskeletal   Abdominal   Peds  Hematology negative hematology ROS (+)   Anesthesia Other Findings See surgeon's H&P   Reproductive/Obstetrics negative OB ROS                           Anesthesia Physical Anesthesia Plan  ASA: III  Anesthesia Plan: General   Post-op Pain Management:    Induction: Intravenous  Airway Management Planned: LMA  Additional Equipment:   Intra-op Plan:   Post-operative Plan: Extubation in OR  Informed Consent: I have reviewed the patients History and Physical, chart, labs and discussed the procedure including the risks, benefits and alternatives for the proposed anesthesia with the patient or authorized representative who has indicated his/her understanding and acceptance.     Plan Discussed with: CRNA and Surgeon  Anesthesia Plan Comments:         Anesthesia Quick Evaluation

## 2011-08-07 NOTE — H&P (Signed)
HPI  This is a 61 year old male patient of Dr. Norval Gable who presents after undergoing a CT scan that showed a right inguinal hernia. He has a history of diverticulitis for which he's been treated for since early February. He's had about 3 weeks of bandlike lower abdominal pain especially prominent in the left lower quadrant and suprapubic region. This has been associated with some nausea. He underwent treatment with antibiotics and has had no fevers and gradual improvement in his abdominal pain. He is having loose bowel movements with no blood noted.  He does have a history of a colonoscopy and is due again in 2015.H has a history of a right inguinal bulge for about 7 years. This protrudes and is hard now.It has gotten worse over this time. It is worse during the day when he is standing and doing activity and is better when he is lying down. He has no real difficulty with urination. As he was completing his course of antibiotics for his diverticular disease he underwent a CT scan to determine if he had a complication from his diverticular disease. The CT scan showed that he had diverticulosis but no real evidence of any further diverticulitis. He does however show what appears to be a right inguinal hernia with some fluid in the sac. He was in for further evaluation for consideration of repair.  He has recently had a melanoma removed also for which he is doing well.  Past Medical History   Diagnosis  Date   .  Alcoholism in remission      in AA   .  Hx of migraines    .  Bipolar affective disorder    .  Hydrocele    .  Diverticulosis of colon (without mention of hemorrhage)    .  Anxiety    .  Depression    .  Hiatal hernia     Past Surgical History   Procedure  Date   .  Colonoscopy  2003     negative ; Dr Jarold Motto   .  Upper gastrointestinal endoscopy  1983     hiatal hernia   .  Undescended testicle surgery      as infant   .  Vasectomy     Family History   Problem  Relation  Age  of Onset   .  COPD  Father    .  COPD  Mother    .  Colon cancer  Neg Hx    .  Diabetes  Paternal Grandmother    .  Parkinsonism  Paternal Grandmother     Social History  History   Substance Use Topics   .  Smoking status:  Never Smoker   .  Smokeless tobacco:  Never Used   .  Alcohol Use:  No      former alcoholic; now in remission    Allergies   Allergen  Reactions   .  Omnipaque (Iohexol)      Desc: developed hives after injection 50 mg benadryl given to pt.    Current Outpatient Prescriptions   Medication  Sig  Dispense  Refill   .  carbidopa-levodopa (SINEMET) 10-100 MG per tablet  Take 1 tablet by mouth every morning.     .  diphenhydrAMINE (BENADRYL) 50 MG capsule  Take one at 1 hour before CT scan  1 capsule  0   .  divalproex (DEPAKOTE) 500 MG EC tablet  1500 mg at night     .  hyoscyamine (LEVBID) 0.375 MG 12 hr tablet  Take 1 tablet (0.375 mg total) by mouth every 12 (twelve) hours as needed for cramping.  60 tablet  1   .  lamoTRIgine (LAMICTAL) 100 MG tablet  Take 100 mg by mouth daily.     Marland Kitchen  levothyroxine (SYNTHROID, LEVOTHROID) 50 MCG tablet  Take 50 mcg by mouth daily.     Marland Kitchen  lithium 300 MG capsule  Take 300 mg by mouth 4 (four) times daily.     .  mesalamine (APRISO) 0.375 G 24 hr capsule  Take four tablets by mouth each morning  120 capsule  1   .  naltrexone (DEPADE) 50 MG tablet  Take 50 mg by mouth daily.     .  naproxen (NAPROSYN) 500 MG tablet  Take 500 mg by mouth as needed.     .  propranolol (INDERAL) 60 MG tablet  Take 60 mg by mouth daily.     .  QUEtiapine (SEROQUEL) 100 MG tablet  Take 100 mg by mouth daily.     .  rizatriptan (MAXALT) 10 MG tablet  Take 10 mg by mouth as needed. May repeat in 2 hours if needed     .  spironolactone (ALDACTONE) 25 MG tablet  Take 25 mg by mouth every morning.     .  tadalafil (CIALIS) 20 MG tablet  Take 1 tablet (20 mg total) by mouth daily as needed. Every 3rd day as needed  10 tablet  1   .  predniSONE (DELTASONE)  50 MG tablet  Take one at 13, 7 and 1 hour before CT scan.  3 tablet  0     Blood pressure 116/74, pulse 70, resp. rate 16, height 6\' 1"  (1.854 m), weight 207 lb (93.895 kg).  Physical Exam  Physical Exam  Vitals reviewed.  Constitutional: He appears well-developed and well-nourished.  Cardiovascular: Normal rate, regular rhythm and normal heart sounds.  Pulmonary/Chest: Effort normal and breath sounds normal. He has no wheezes. He has no rales.  Abdominal: Soft. Normal appearance and bowel sounds are normal. There is no hepatomegaly. There is no tenderness. A hernia is present. Hernia confirmed positive in the right inguinal area (right inguinal hernia and hydrocele). Hernia confirmed negative in the ventral area and confirmed negative in the left inguinal area.   Data Reviewed  CT ABDOMEN AND PELVIS WITH CONTRAST  Technique: Multidetector CT imaging of the abdomen and pelvis was  performed following the standard protocol during bolus  administration of intravenous contrast.  Contrast: OMNIPAQUE IOHEXOL 300 MG/ML IV SOLN  Comparison: Lowry Imaing at [facility] CT exam  from 11/14/2004.  Findings: No focal abnormalities seen in the liver or spleen.  Spleen is upper normal for size at 14 cm. The stomach, duodenum,  pancreas, and left adrenal gland are unremarkable. 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  Layering calcified stones measuring up to 7 mm in diameter seen in  the gallbladder. No intra or extrahepatic biliary duct dilatation.  Tiny low-density cortical lesions are seen in each kidney, too  small to characterize, but likely representing cortical cyst.  No evidence for abdominal aortic aneurysm. There is no free fluid  or lymphadenopathy in the abdomen.  Imaging through the pelvis shows no free intraperitoneal fluid. A  right inguinal hernia contains fat and there is a small amount of  fluid in the  hernia sac.  While the hernia is chronic, the fluid  within the hernia sac is new since the prior study. No pelvic  sidewall lymphadenopathy. There is mild diverticular change in the  left colon without evidence for diverticulitis. Terminal ileum is  normal. The appendix is normal.  Bone windows reveal no worrisome lytic or sclerotic osseous  lesions.  IMPRESSION:  No acute findings in the abdomen or pelvis.  Right inguinal hernia contains fat and there is some fluid in the  hernia sac. The etiology of the fluid is indeterminate, but there  is not a substantial amount of edema or inflammation in the tissues  around the right inguinal canal to suggest that this is secondary  to fatty incarceration. There is no evidence for bowel extension  into the hernia.  Left colonic diverticulosis without diverticulitis.  Cholelithiasis.  Assessment   History of diverticulitis  RIH and hydrocele   Plan  We discussed open right inguinal hernia repair with mesh as well as drainage of what I think is a right sided hydrocele. I think this would be best done open to combine the two procedures. I do not think this is urgent as it is easily reducible and there is no bowel involvement now. I think the fluid is likely in the hydrocele component. I told him I would recommend repair in the next several months and we discussed possibility of needing an emergency operation although this is unlikely given the chronicity of his symptoms and the ease with which this is reduced.  We discussed observation versus repair. We discussed both laparoscopic and open inguinal hernia repairs. I described the procedure in detail. The patient was given educational material. Goals should be achieved with surgery. We discussed the usage of mesh and the rationale behind that. We went over the pathophysiology of inguinal hernia. We have elected to perform open inguinal hernia repair with mesh. We discussed the risks including bleeding,  infection, recurrence, postoperative pain and chronic groin pain, testicular injury, urinary retention, numbness in groin and around incision.

## 2011-08-07 NOTE — Transfer of Care (Signed)
Immediate Anesthesia Transfer of Care Note  Patient: Jeffrey Acosta  Procedure(s) Performed: Procedure(s) (LRB): HERNIA REPAIR INGUINAL ADULT (Right) INSERTION OF MESH (Right)  Patient Location: PACU  Anesthesia Type: GA combined with regional for post-op pain  Level of Consciousness: awake, alert , oriented and patient cooperative  Airway & Oxygen Therapy: Patient Spontanous Breathing and Patient connected to face mask oxygen  Post-op Assessment: Report given to PACU RN and Post -op Vital signs reviewed and stable  Post vital signs: Reviewed and stable  Complications: No apparent anesthesia complications

## 2011-08-07 NOTE — Op Note (Signed)
Preoperative diagnosis: Right inguinal hernia Postoperative diagnosis: Right inguinal hernia, direct Procedure: Right inguinal hernia repair with UltraPro hernia system, large Surgeon: Dr. Christia Reading Anesthesia: General with LMA, TAP block Estimated blood loss: 25 cc Complications: None Drains: None Specimens: None Sponge needle count correct x2 at operation Disposition patient to recovery in stable condition  Indications: This is a 61 year old male with multiple medical problems who presents with a symptomatic right groin bulge. He had undergone a CT scan prior to seeing me that showed a fluid-filled right groin hernia. We discussed an open right inguinal hernia repair with mesh. We discussed the risk and benefits of this prior to beginning.  Procedure: After informed consent was obtained the patient was taken to the operating room. He was administered 2 g of intravenous cefazolin. Sequential compression devices were placed on his legs prior to beginning. He was then placed under general anesthesia with an LMA. He previously received a TAP block prior to beginning with anesthesiology. His right groin and scrotum were then prepped and draped in the standard sterile surgical fashion. Surgical time out was then performed.  I infiltrated quarter percent Marcaine throughout the area of the incision. I then made an incision carried this out to the subcutaneous tissue with cautery. I ligated the superficial epigastric vein with 2-0 Vicryl suture. He had a very deep groin. I eventually identified his external abdominal oblique and cleared this off. I then entered this with a knife and opened this up in its entirety with the Metzenbaum scissors. He was noted to have a very large direct hernia at this point really had no floor to his groin left. It took me about 45 minutes to be up to dissect this hernia sac free from the surrounding structures. He had no real indirect hernia sac identified. Eventually I  was able to dissect as direct hernia sac free. Again there was really not any tissue present from his internal ring all the way to the pubic tubercle. I entered into his hernia sac using the scissors. I then reduced all of this in its entirety. I excised a fair amount of this hernia sac. I then placed an Ultra Pro hernia system with the bottom portion of the bilayer present the preperitoneal space. I laid this flap over Cooper's ligament. I then closed the floor of the groin. I then deployed the top portion of the bilayer. I made a cut around the mesh and wrapped this around the spermatic cord. The spermatic cord structures were preserved throughout the operation. I used multiple 2-0 Prolene sutures to attach to the pubic tubercle, inguinal ligament inferiorly, and oblique superiorly. I attached the 2 ends of the cut mesh to the oblique superiorly as well. I laid the lateral portion flat underneath the oblique. This completely obliterated the defect and appeared to give good repair. I then observed hemostasis. I irrigated this copiously. I closed the external oblique with 2-0 Vicryl. I closed Scarpa's fascia with 3-0 Vicryl. I closed the skin with a 4 Monocryl in a subcutaneous fashion. I then placed Dermabond over this. He tolerated this well was extubated and transferred to recovery room in stable condition.

## 2011-08-09 NOTE — Anesthesia Postprocedure Evaluation (Signed)
Anesthesia Post Note  Patient: Jeffrey Acosta  Procedure(s) Performed: Procedure(s) (LRB): HERNIA REPAIR INGUINAL ADULT (Right) INSERTION OF MESH (Right)  Anesthesia type: General  Patient location: PACU  Post pain: Pain level controlled  Post assessment: Patient's Cardiovascular Status Stable  Last Vitals:  Filed Vitals:   08/07/11 1730  BP: 152/85  Pulse: 73  Temp: 36.7 C  Resp: 18    Post vital signs: Reviewed and stable  Level of consciousness: alert  Complications: No apparent anesthesia complications

## 2011-08-10 ENCOUNTER — Encounter (HOSPITAL_BASED_OUTPATIENT_CLINIC_OR_DEPARTMENT_OTHER): Payer: Self-pay | Admitting: General Surgery

## 2011-08-10 NOTE — Addendum Note (Signed)
Addendum  created 08/10/11 1001 by Lance Coon, CRNA   Modules edited:Anesthesia Events

## 2011-09-04 ENCOUNTER — Encounter (INDEPENDENT_AMBULATORY_CARE_PROVIDER_SITE_OTHER): Payer: Self-pay | Admitting: General Surgery

## 2011-09-04 ENCOUNTER — Ambulatory Visit (INDEPENDENT_AMBULATORY_CARE_PROVIDER_SITE_OTHER): Payer: 59 | Admitting: General Surgery

## 2011-09-04 VITALS — BP 132/86 | HR 60 | Temp 98.4°F | Resp 12 | Ht 73.0 in | Wt 205.4 lb

## 2011-09-04 DIAGNOSIS — Z09 Encounter for follow-up examination after completed treatment for conditions other than malignant neoplasm: Secondary | ICD-10-CM

## 2011-09-04 NOTE — Progress Notes (Signed)
Subjective:     Patient ID: Jeffrey Acosta, male   DOB: Sep 18, 1950, 61 y.o.   MRN: [id number]  HPI 52 yom s/p right groin hernia repair with mesh.  He is doing well without complaints today.  Moving around well, no pain.  Swelling is all resolved.  Review of Systems     Objective:   Physical Exam Right groin incision healing well without infection    Assessment:     S/p RIH     Plan:     Return to full activity Return as needed

## 2012-01-20 ENCOUNTER — Encounter: Payer: Self-pay | Admitting: Internal Medicine

## 2012-01-20 ENCOUNTER — Ambulatory Visit (INDEPENDENT_AMBULATORY_CARE_PROVIDER_SITE_OTHER): Payer: PRIVATE HEALTH INSURANCE | Admitting: Internal Medicine

## 2012-01-20 ENCOUNTER — Telehealth: Payer: Self-pay | Admitting: Internal Medicine

## 2012-01-20 VITALS — BP 124/80 | HR 76 | Temp 98.2°F | Wt 199.6 lb

## 2012-01-20 DIAGNOSIS — R259 Unspecified abnormal involuntary movements: Secondary | ICD-10-CM

## 2012-01-20 DIAGNOSIS — R269 Unspecified abnormalities of gait and mobility: Secondary | ICD-10-CM

## 2012-01-20 DIAGNOSIS — R253 Fasciculation: Secondary | ICD-10-CM

## 2012-01-20 DIAGNOSIS — F1021 Alcohol dependence, in remission: Secondary | ICD-10-CM

## 2012-01-20 DIAGNOSIS — H5500 Unspecified nystagmus: Secondary | ICD-10-CM

## 2012-01-20 DIAGNOSIS — Z0000 Encounter for general adult medical examination without abnormal findings: Secondary | ICD-10-CM

## 2012-01-20 DIAGNOSIS — Z862 Personal history of diseases of the blood and blood-forming organs and certain disorders involving the immune mechanism: Secondary | ICD-10-CM

## 2012-01-20 DIAGNOSIS — G250 Essential tremor: Secondary | ICD-10-CM

## 2012-01-20 NOTE — Patient Instructions (Addendum)
Review and correct the record as indicated. Please share record with all medical staff seen.   If you activate My Chart; the results can be released to you as soon as they populate from the lab. If you choose not to use this program; the labs have to be reviewed, copied & mailed   causing a delay in getting the results to you.  

## 2012-01-20 NOTE — Telephone Encounter (Signed)
Noted, patient with pending appointment today, Per Dr.Hopper's protocol if patient with pending appointment or sent to emergency room/ urgent care ok to close encounter

## 2012-01-20 NOTE — Addendum Note (Signed)
Addended by: Silvio Pate D on: 01/20/2012 04:18 PM   Modules accepted: Orders

## 2012-01-20 NOTE — Telephone Encounter (Signed)
Caller: [name]/Patient; Patient Name: Jeffrey Acosta; PCP: Marga Melnick; Best Callback Phone Number: [phone]; Reason for call: Caller reports he has had some tremors of his hands, arms and shoulders for the past 3 weeks or so. Caller reports he has had some tremors ever since he was in rehab 2.5 years ago for ETOH abuse. He has not drank since 06/12/2009. These tremors are worsening. Sometimes unable to hold a pen or a fork. Also having some weakness in his legs and has fallen several times in recent past, esp when trying to get out of a chair. Slurred speach at times that sounds as if he has been drinking, unable to begin sentence.  Caller has spoken with Psychiatrist about same and was advised to speak with PCP. Today, caller has tremors in right hand and left shoulder and weakness in legs. Symptoms every day, some days are worse than others. Per Neurological Deficits Protocol,  Weakness or pain that persists for 4 or more hours or recurs and has NOT been previously evaluated, See Provider within 4 hours. Caller was scheduled an appointment by staff at appointment desk for today at 15:30 with PCP. Caller advised of same and will keep that appointment. Emergent callback symptoms given.

## 2012-01-20 NOTE — Progress Notes (Signed)
  Subjective:    Patient ID: Jeffrey Acosta, male    DOB: 29-Apr-1950, 61 y.o.   MRN: [id number]  HPI He has had bilateral hand tremors for least 2 1/2 years. He noted it when he left rehabilitation; he has remained sober during that span of time.  In the last 3 weeks has been a significant increase in the tremors to the point where he has difficulty holding utensils he eats. He is also noted fasciculations in the left shoulder area.  He describes profound weakness in his legs resulting in falls.  Periodically he states his voice sounds "thick tongued"  He is on thyroid replacement and propranolol 20 mg 3 times a day from Dr. Evelene Croon, his psychiatrist.    Review of Systems He denies cardiac prodrome of change in heart rhythm or rate prior to the falls. He's had no neurologic prodrome prior to the fall such as headache, extremity numbness or tingling, or localized weakness. He denies seizure stigmata or syncope.     Objective:   Physical Exam  Gen. appearance: Thin but adequately nourished, in no distress Eyes: Extraocular motion intact, field of vision normal, vision grossly intact. Unsustained horizontal  nystagmus ENT: Canals clear, tympanic membranes normal,  hearing grossly normal. Neck: Normal range of motion, no masses, normal thyroid Cardiovascular: Rate and rhythm normal; no murmurs, gallops or extra heart sounds Muscle skeletal: Range of motion &  strength normal. Increased tone. Intermittent fasciculations right upper extremity, especially if she'll Neuro:no cranial nerve deficit, deep tendon  reflexes 1.5+. He uses his arms to push up from the chair and climb onto the exam table. Gait is broad-based & intermittently "high stepping". Romberg testing is negative except for increased tremor with finger-nose testing. Head tremor also present Lymph: No cervical or axillary LA Skin: Warm and dry without suspicious lesions or rashes Psych: no anxiety or mood change. Normally interactive and  cooperative.        Assessment & Plan:  #1 tremor, essential  #2 subjective leg weakness with normal strength to opposition  #3 abnormal gait  #4 nystagmus  #5 fasciculations  #6:  alcohol  in remission  Plan: See labs. Referral to movement disorder specialist

## 2012-01-21 LAB — CBC WITH DIFFERENTIAL/PLATELET
Basophils Absolute: 0 10*3/uL (ref 0.0–0.1)
Basophils Relative: 0.2 % (ref 0.0–3.0)
Eosinophils Absolute: 0.2 10*3/uL (ref 0.0–0.7)
Eosinophils Relative: 1.6 % (ref 0.0–5.0)
HCT: 41.9 % (ref 39.0–52.0)
Hemoglobin: 14.1 g/dL (ref 13.0–17.0)
Lymphocytes Relative: 10.9 % — ABNORMAL LOW (ref 12.0–46.0)
Lymphs Abs: 1.3 10*3/uL (ref 0.7–4.0)
MCHC: 33.7 g/dL (ref 30.0–36.0)
MCV: 98.9 fl (ref 78.0–100.0)
Monocytes Absolute: 0.7 10*3/uL (ref 0.1–1.0)
Monocytes Relative: 6.1 % (ref 3.0–12.0)
Neutro Abs: 9.8 10*3/uL — ABNORMAL HIGH (ref 1.4–7.7)
Neutrophils Relative %: 81.2 % — ABNORMAL HIGH (ref 43.0–77.0)
Platelets: 238 10*3/uL (ref 150.0–400.0)
RBC: 4.24 Mil/uL (ref 4.22–5.81)
RDW: 12.4 % (ref 11.5–14.6)
WBC: 12.1 10*3/uL — ABNORMAL HIGH (ref 4.5–10.5)

## 2012-01-21 LAB — TSH: TSH: 3.54 u[IU]/mL (ref 0.35–5.50)

## 2012-01-21 LAB — VITAMIN B12: Vitamin B-12: 288 pg/mL (ref 211–911)

## 2012-01-21 LAB — BASIC METABOLIC PANEL
BUN: 22 mg/dL (ref 6–23)
CO2: 25 mEq/L (ref 19–32)
Calcium: 10.1 mg/dL (ref 8.4–10.5)
Chloride: 102 mEq/L (ref 96–112)
Creatinine, Ser: 1.6 mg/dL — ABNORMAL HIGH (ref 0.4–1.5)
GFR: 48.32 mL/min — ABNORMAL LOW (ref >60.00)
Glucose, Bld: 78 mg/dL (ref 70–99)
Potassium: 4.6 mEq/L (ref 3.5–5.1)
Sodium: 135 mEq/L (ref 135–145)

## 2012-01-21 LAB — T4, FREE: Free T4: 0.83 ng/dL (ref 0.60–1.60)

## 2012-01-21 LAB — FOLATE: Folate: 10.7 ng/mL (ref >5.9)

## 2012-01-21 LAB — MAGNESIUM: Magnesium: 2.2 mg/dL (ref 1.5–2.5)

## 2012-01-24 LAB — VITAMIN D 1,25 DIHYDROXY
Vitamin D 1, 25 (OH)2 Total: 28 pg/mL (ref 18–72)
Vitamin D2 1, 25 (OH)2: <8 pg/mL
Vitamin D3 1, 25 (OH)2: 28 pg/mL

## 2012-01-25 ENCOUNTER — Telehealth: Payer: Self-pay

## 2012-01-25 NOTE — Telephone Encounter (Signed)
Patient returned your call. He can be reached at [phone]

## 2012-01-25 NOTE — Telephone Encounter (Signed)
Message copied by Maurice Small on Mon Jan 25, 2012 10:41 AM ------      Message from: Pecola Lawless      Created: Sun Jan 24, 2012  7:03 AM       Vitamin D level is still pending.The white count is slightly elevated; please report any signs or symptoms of fever such as fever, cough, nasal discharge, etc.        BUN, creatinine, and GFR  all assess kidney function. To protect the kidneys it  is important to control your blood pressure and sugar. You should also stay well hydrated. Drink to thirst, up to 32 ounces of fluids per day.Recheck BUN , creatinie in 4 weeks .PLEASE BRING THESE INSTRUCTIONS TO FOLLOW UP  LAB APPOINTMENT.This will guarantee correct labs are drawn, eliminating need for repeat blood sampling ( needle sticks ! ). Diagnoses /Codes: elevated creatinine. B12 level is low normal; monitor this annually.  All other labs are excellent.[employer]

## 2012-01-25 NOTE — Telephone Encounter (Signed)
Left message on voicemail for patient to return call when available   

## 2012-01-26 NOTE — Telephone Encounter (Signed)
Patient aware of results, patient without question at the time of call

## 2012-02-02 ENCOUNTER — Ambulatory Visit (INDEPENDENT_AMBULATORY_CARE_PROVIDER_SITE_OTHER): Payer: PRIVATE HEALTH INSURANCE

## 2012-02-02 DIAGNOSIS — Z23 Encounter for immunization: Secondary | ICD-10-CM

## 2012-02-03 ENCOUNTER — Other Ambulatory Visit: Payer: Self-pay | Admitting: Diagnostic Neuroimaging

## 2012-02-03 DIAGNOSIS — R259 Unspecified abnormal involuntary movements: Secondary | ICD-10-CM

## 2012-02-03 DIAGNOSIS — R269 Unspecified abnormalities of gait and mobility: Secondary | ICD-10-CM

## 2012-02-03 DIAGNOSIS — R292 Abnormal reflex: Secondary | ICD-10-CM

## 2012-02-09 ENCOUNTER — Ambulatory Visit
Admission: RE | Admit: 2012-02-09 | Discharge: 2012-02-09 | Disposition: A | Discharge status: Home / Self Care | Payer: 59 | Source: Ambulatory Visit | Attending: Diagnostic Neuroimaging | Admitting: Diagnostic Neuroimaging

## 2012-02-09 DIAGNOSIS — R259 Unspecified abnormal involuntary movements: Secondary | ICD-10-CM

## 2012-02-09 DIAGNOSIS — R292 Abnormal reflex: Secondary | ICD-10-CM

## 2012-02-09 DIAGNOSIS — R269 Unspecified abnormalities of gait and mobility: Secondary | ICD-10-CM

## 2012-04-23 ENCOUNTER — Other Ambulatory Visit: Payer: Self-pay | Admitting: Internal Medicine

## 2012-04-25 NOTE — Telephone Encounter (Signed)
Refill done.  

## 2012-11-25 ENCOUNTER — Ambulatory Visit (INDEPENDENT_AMBULATORY_CARE_PROVIDER_SITE_OTHER): Payer: PRIVATE HEALTH INSURANCE | Admitting: Internal Medicine

## 2012-11-25 ENCOUNTER — Encounter: Payer: Self-pay | Admitting: Internal Medicine

## 2012-11-25 VITALS — BP 126/72 | HR 90 | Resp 12 | Ht 72.0 in | Wt 189.2 lb

## 2012-11-25 DIAGNOSIS — M899 Disorder of bone, unspecified: Secondary | ICD-10-CM

## 2012-11-25 DIAGNOSIS — M8580 Other specified disorders of bone density and structure, unspecified site: Secondary | ICD-10-CM

## 2012-11-25 DIAGNOSIS — G319 Degenerative disease of nervous system, unspecified: Secondary | ICD-10-CM | POA: Insufficient documentation

## 2012-11-25 DIAGNOSIS — Z0000 Encounter for general adult medical examination without abnormal findings: Secondary | ICD-10-CM

## 2012-11-25 DIAGNOSIS — E559 Vitamin D deficiency, unspecified: Secondary | ICD-10-CM

## 2012-11-25 DIAGNOSIS — N289 Disorder of kidney and ureter, unspecified: Secondary | ICD-10-CM | POA: Insufficient documentation

## 2012-11-25 DIAGNOSIS — C439 Malignant melanoma of skin, unspecified: Secondary | ICD-10-CM | POA: Insufficient documentation

## 2012-11-25 HISTORY — DX: Malignant melanoma of skin, unspecified: C43.9

## 2012-11-25 MED ORDER — TADALAFIL 20 MG PO TABS: ORAL_TABLET | ORAL | Status: DC

## 2012-11-25 NOTE — Progress Notes (Signed)
  Subjective:    Patient ID: Jeffrey Acosta, male    DOB: 02/21/1951, 62 y.o.   MRN: [id number]  HPI  Jeffrey Acosta is here for a physical;acute issues denied     Review of Systems The regimen prescribed by Dr.Kaur is effectively controlling his manic depression issues.   He has a history of migraines but these have essentially resolved.  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 Darrelyn Hillock     Objective:   Physical Exam Gen.: Thin but adequately well-nourished in appearance. Alert, appropriate and cooperative throughout exam. Head: Normocephalic without obvious abnormalities;  pattern alopecia . Beard & moustache Eyes: No corneal or conjunctival inflammation noted. Pupils equal round reactive to light and accommodation. Horizontal nystagmus bilaterally. Extraocular motion intact. Vision grossly normal with lenses Ears: External  ear exam reveals no significant lesions or deformities. Canals clear .TMs normal. Hearing is grossly decreased bilaterally. Nose: External nasal exam reveals no deformity or inflammation. Nasal mucosa are pink and moist. No lesions or exudates noted.   Mouth: Oral mucosa and oropharynx reveal no lesions or exudates. Teeth in good repair. Neck: No deformities, masses, or tenderness noted. Range of motion & Thyroid normal. Lungs: Normal respiratory effort; chest expands symmetrically. Lungs are clear to auscultation without rales, wheezes, or increased work of breathing. Heart: Normal rate and rhythm. Normal S1 and S2. No gallop, click, or rub. S4 w/o  murmur. Abdomen: Bowel sounds normal; abdomen soft and nontender. No masses, organomegaly or hernias noted. Genitalia: R testicle larger than L. External hemorrhoidal tags.Prostate is normal without enlargement, asymmetry, nodularity, or induration.                                 Musculoskeletal/extremities: There is minimal asymmetry of the posterior thoracic  musculature suggesting occult scoliosis. No clubbing, cyanosis, edema, or significant extremity  deformity noted. Range of motion normal .Tone & strength  Normal. Joints normal . Nail health good. Able to lie down & sit up w/o help. Negative SLR bilaterally but pain @ R LS area Vascular: Carotid, radial artery, dorsalis pedis and  posterior tibial pulses are full and equal. No bruits present. Neurologic: Alert and oriented x3. Deep tendon reflexes symmetrical and normal.  Gait normal  including heel & toe walking .        Skin: Intact without suspicious lesions or rashes. Lymph: No cervical, axillary, or inguinal lymphadenopathy present. Psych: Mood and affect are normal. Normally interactive                                                                                      Assessment & Plan:  #1 comprehensive physical exam; no acute findings  Plan: see Orders  & Recommendations

## 2012-11-25 NOTE — Patient Instructions (Addendum)
Preventive Health Care: Exercise at least 30-45 minutes a day,  3-4 days a week.  Eat a low-fat diet with lots of fruits and vegetables, up to 7-9 servings per day. This would eliminate the need for vitamin supplements. Consume less than 40 grams of sugar (preferably ZERO) per day from foods & drinks with High Fructose Corn Sugar as #1,2,3 or # 4 on label. Health Care Power of Attorney & Living Will. Complete these if not in place ; these place you in charge of your health care decisions. Order for labs entered into  the computer; these will be performed at [address] [facility]. across from [facility]. No appointment is necessary.  Take the EKG to any emergency room or preop visits. There are nonspecific changes; as long as there is no new change these are not clinically significant . If the old EKG is not available for comparison; it may result in unnecessary hospitalization for observation with significant unnecessary expense.  If you activate the  My Chart system; lab & Xray results will be released directly  to you as soon as I review & address these through the computer. If you choose not to sign up for My Chart within 36 hours of labs being drawn; results will be reviewed & interpretation added before being copied & mailed, causing a delay in getting the results to you.If you do not receive that report within 7-10 days ,please call. Additionally you can use this system to gain direct  access to your records  if  out of town or @ an office of a  physician who is not in  the My Chart network.  This improves continuity of care & places you in control of your medical record.

## 2012-11-29 ENCOUNTER — Ambulatory Visit (INDEPENDENT_AMBULATORY_CARE_PROVIDER_SITE_OTHER): Payer: PRIVATE HEALTH INSURANCE

## 2012-11-29 ENCOUNTER — Other Ambulatory Visit (INDEPENDENT_AMBULATORY_CARE_PROVIDER_SITE_OTHER): Payer: PRIVATE HEALTH INSURANCE

## 2012-11-29 DIAGNOSIS — Z0000 Encounter for general adult medical examination without abnormal findings: Secondary | ICD-10-CM

## 2012-11-29 DIAGNOSIS — Z23 Encounter for immunization: Secondary | ICD-10-CM

## 2012-11-29 LAB — LIPID PANEL
Cholesterol: 173 mg/dL (ref 0–200)
HDL: 43.9 mg/dL (ref >39.00)
Total CHOL/HDL Ratio: 4
Triglycerides: 486 mg/dL — ABNORMAL HIGH (ref 0.0–149.0)
VLDL: 97.2 mg/dL — ABNORMAL HIGH (ref 0.0–40.0)

## 2012-11-29 LAB — HEPATIC FUNCTION PANEL
ALT: 11 U/L (ref 0–53)
AST: 17 U/L (ref 0–37)
Albumin: 4.2 g/dL (ref 3.5–5.2)
Alkaline Phosphatase: 49 U/L (ref 39–117)
Bilirubin, Direct: 0.2 mg/dL (ref 0.0–0.3)
Total Bilirubin: 1.1 mg/dL (ref 0.3–1.2)
Total Protein: 6.9 g/dL (ref 6.0–8.3)

## 2012-11-29 LAB — BASIC METABOLIC PANEL
BUN: 11 mg/dL (ref 6–23)
CO2: 24 mEq/L (ref 19–32)
Calcium: 10.1 mg/dL (ref 8.4–10.5)
Chloride: 105 mEq/L (ref 96–112)
Creatinine, Ser: 1.3 mg/dL (ref 0.4–1.5)
GFR: 58.52 mL/min — ABNORMAL LOW (ref >60.00)
Glucose, Bld: 85 mg/dL (ref 70–99)
Potassium: 3.6 mEq/L (ref 3.5–5.1)
Sodium: 141 mEq/L (ref 135–145)

## 2012-11-29 LAB — CBC WITH DIFFERENTIAL/PLATELET
Basophils Absolute: 0 10*3/uL (ref 0.0–0.1)
Basophils Relative: 0.6 % (ref 0.0–3.0)
Eosinophils Absolute: 0.1 10*3/uL (ref 0.0–0.7)
Eosinophils Relative: 3 % (ref 0.0–5.0)
HCT: 39.2 % (ref 39.0–52.0)
Hemoglobin: 13.4 g/dL (ref 13.0–17.0)
Lymphocytes Relative: 33.7 % (ref 12.0–46.0)
Lymphs Abs: 1.7 10*3/uL (ref 0.7–4.0)
MCHC: 34.3 g/dL (ref 30.0–36.0)
MCV: 103.6 fl — ABNORMAL HIGH (ref 78.0–100.0)
Monocytes Absolute: 0.3 10*3/uL (ref 0.1–1.0)
Monocytes Relative: 7 % (ref 3.0–12.0)
Neutro Abs: 2.7 10*3/uL (ref 1.4–7.7)
Neutrophils Relative %: 55.7 % (ref 43.0–77.0)
Platelets: 207 10*3/uL (ref 150.0–400.0)
RBC: 3.78 Mil/uL — ABNORMAL LOW (ref 4.22–5.81)
RDW: 15.7 % — ABNORMAL HIGH (ref 11.5–14.6)
WBC: 4.9 10*3/uL (ref 4.5–10.5)

## 2012-11-29 LAB — TSH: TSH: 1.26 u[IU]/mL (ref 0.35–5.50)

## 2012-11-29 LAB — LDL CHOLESTEROL, DIRECT: Direct LDL: 75.6 mg/dL

## 2012-12-01 ENCOUNTER — Other Ambulatory Visit: Payer: PRIVATE HEALTH INSURANCE

## 2012-12-01 LAB — VITAMIN D 1,25 DIHYDROXY
Vitamin D 1, 25 (OH)2 Total: 31 pg/mL (ref 18–72)
Vitamin D2 1, 25 (OH)2: <8 pg/mL
Vitamin D3 1, 25 (OH)2: 31 pg/mL

## 2012-12-02 ENCOUNTER — Ambulatory Visit
Admission: RE | Admit: 2012-12-02 | Discharge: 2012-12-02 | Disposition: A | Discharge status: Home / Self Care | Payer: PRIVATE HEALTH INSURANCE | Source: Ambulatory Visit | Attending: Internal Medicine | Admitting: Internal Medicine

## 2012-12-02 DIAGNOSIS — M8580 Other specified disorders of bone density and structure, unspecified site: Secondary | ICD-10-CM

## 2012-12-07 ENCOUNTER — Encounter: Payer: Self-pay | Admitting: Internal Medicine

## 2013-01-10 ENCOUNTER — Ambulatory Visit (INDEPENDENT_AMBULATORY_CARE_PROVIDER_SITE_OTHER): Payer: PRIVATE HEALTH INSURANCE | Admitting: Family Medicine

## 2013-01-10 VITALS — BP 134/82 | HR 88 | Temp 98.9°F | Resp 18 | Ht 72.0 in | Wt 192.0 lb

## 2013-01-10 DIAGNOSIS — M25579 Pain in unspecified ankle and joints of unspecified foot: Secondary | ICD-10-CM

## 2013-01-10 DIAGNOSIS — M7989 Other specified soft tissue disorders: Secondary | ICD-10-CM

## 2013-01-10 DIAGNOSIS — M109 Gout, unspecified: Secondary | ICD-10-CM

## 2013-01-10 DIAGNOSIS — M25572 Pain in left ankle and joints of left foot: Secondary | ICD-10-CM

## 2013-01-10 LAB — POCT CBC
Granulocyte percent: 64.8 %G (ref 37–80)
HCT, POC: 35.4 % — AB (ref 43.5–53.7)
Hemoglobin: 11.3 g/dL — AB (ref 14.1–18.1)
Lymph, poc: 1.5 (ref 0.6–3.4)
MCH, POC: 32.9 pg — AB (ref 27–31.2)
MCHC: 31.9 g/dL (ref 31.8–35.4)
MCV: 103.1 fL — AB (ref 80–97)
MID (cbc): 0.3 (ref 0–0.9)
MPV: 7.5 fL (ref 0–99.8)
POC Granulocyte: 3.4 (ref 2–6.9)
POC LYMPH PERCENT: 29 %L (ref 10–50)
POC MID %: 6.2 %M (ref 0–12)
Platelet Count, POC: 201 10*3/uL (ref 142–424)
RBC: 3.43 M/uL — AB (ref 4.69–6.13)
RDW, POC: 16.2 %
WBC: 5.3 10*3/uL (ref 4.6–10.2)

## 2013-01-10 LAB — URIC ACID: Uric Acid, Serum: 11 mg/dL — ABNORMAL HIGH (ref 4.0–7.8)

## 2013-01-10 MED ORDER — COLCHICINE 0.6 MG PO TABS: ORAL_TABLET | ORAL | Status: DC

## 2013-01-10 NOTE — Progress Notes (Signed)
[facility]:  Office Visit  Chief Complaint:  Chief Complaint  Patient presents with  . Toe Pain    has a history of Gout. He states the pain feels the same as in the past. Started on Friday.    HPI: Jeffrey Acosta is a 62 y.o. male who complains of left foot pain in 2nd toe and also left  5th toe. He had similar sxs of pain and swelling 7 years ago.  He denies fevers, numbness, chills, weakness. He drinks alcohol, about 6 drinks per month, he eats a lot of red meats and seafood. He has had NKI. He has been walking but nothing else. He deneis any prior stress fx or osteoporosis or  Chronic steroid use  Past Medical History  Diagnosis Date  . 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 Hx of migraines   . Hydrocele   . Diverticulosis of colon (without mention of hemorrhage)   . Anxiety   . GERD (gastroesophageal reflux disease)   . Hiatal hernia   . Bipolar affective disorder   . Depression   . Substance abuse    Past Surgical History  Procedure Laterality Date  . Colonoscopy  2003    negative ; Dr Jarold Motto  . Upper gastrointestinal endoscopy  1983    hiatal hernia  . Undescended testicle surgery      as infant  . Vasectomy    . Tonsillectomy    . Melanoma excision  07/28/11    Dr Irene Limbo  . Inguinal hernia repair  08/07/2011    Procedure: HERNIA REPAIR INGUINAL ADULT;  Surgeon: Emelia Loron, MD;  Location: [facility];  Service: General;  Laterality: Right;  . Hernia repair     History   Social History  . Marital Status: Married    Spouse Name: N/A    Number of Children: N/A  . Years of Education: N/A   Social History Main Topics  . Smoking status: Never Smoker   . Smokeless tobacco: Never Used  . Alcohol Use: No     Comment: former alcoholic; now in remission  . Drug Use: No  . Sexual Activity: None   Other Topics Concern  . None   Social History Narrative  . None   Family History  Problem Relation Age of Onset  .  COPD Father   . Heart attack Father 85  . COPD Mother   . Colon cancer Neg Hx   . Diabetes Paternal Grandmother   . Parkinsonism Paternal Grandmother   . Stroke Paternal Grandmother 95  . Stroke Maternal Grandfather 72   Allergies  Allergen Reactions  . Omnipaque [Iohexol]      Desc: developed hives after injection; resolved with 50 mg benadryl given to pt.    Prior to Admission medications   Medication Sig Start Date End Date Taking? Authorizing Provider  divalproex (DEPAKOTE) 500 MG EC tablet 2-3 by mouth in the evening   Yes Historical Provider, MD  lamoTRIgine (LAMICTAL) 100 MG tablet Take 100 mg by mouth daily.     Yes Historical Provider, MD  levothyroxine (SYNTHROID, LEVOTHROID) 50 MCG tablet Take 50 mcg by mouth daily.   Yes Historical Provider, MD  lithium 300 MG capsule Take 300 mg by mouth. 2 by mouth 2 times daily   Yes Historical Provider, MD  mesalamine (APRISO) 0.375 G 24 hr capsule Take four tablets by mouth each morning as needed 06/19/11  Yes Mardella Layman,  MD  naltrexone (DEPADE) 50 MG tablet Take 50 mg by mouth daily.     Yes Historical Provider, MD  propranolol (INDERAL) 20 MG tablet Take 20 mg by mouth 3 (three) times daily.   Yes Historical Provider, MD  QUEtiapine (SEROQUEL) 100 MG tablet Take 100 mg by mouth. 4 by mouth at night   Yes Historical Provider, MD  rizatriptan (MAXALT) 10 MG tablet Take 10 mg by mouth as needed. May repeat in 2 hours if needed    Yes Historical Provider, MD  spironolactone (ALDACTONE) 25 MG tablet TAKE ONE TABLET BY MOUTH IN THE MORNING DAILY. 04/23/12  Yes Pecola Lawless, MD  tadalafil (CIALIS) 20 MG tablet TAKE 1 TABLET DAILY AS NEEDED.  EVERY        3RD DAY AS NEEDED. 11/25/12  Yes Pecola Lawless, MD     ROS: The patient denies fevers, chills, night sweats, unintentional weight loss, chest pain, palpitations, wheezing, dyspnea on exertion, nausea, vomiting, abdominal pain, dysuria, hematuria, melena, numbness, weakness, or  tingling.  All other systems have been reviewed and were otherwise negative with the exception of those mentioned in the HPI and as above.    PHYSICAL EXAM: Filed Vitals:   01/10/13 0750  BP: 134/82  Pulse: 88  Temp: 98.9 F (37.2 C)  Resp: 18   Filed Vitals:   01/10/13 0750  Height: 6' (1.829 m)  Weight: 192 lb (87.091 kg)   Body mass index is 26.03 kg/(m^2).  General: Alert, no acute distress HEENT:  Normocephalic, atraumatic, oropharynx patent. EOMI, PERRLA Cardiovascular:  Regular rate and rhythm, no rubs murmurs or gallops.  No Carotid bruits, radial pulse intact. No pedal edema.  Respiratory: Clear to auscultation bilaterally.  No wheezes, rales, or rhonchi.  No cyanosis, no use of accessory musculature GI: No organomegaly, abdomen is soft and non-tender, positive bowel sounds.  No masses. Skin: No rashes. Neurologic: Facial musculature symmetric. Psychiatric: Patient is appropriate throughout our interaction. Lymphatic: No cervical lymphadenopathy Musculoskeletal: Gait intact. + 2nd left toe and 5th toes swelling, erythema, slight warmth, pain with ROM, tender on palpation, 5/5 strength, sensation intact, + DP   LABS: Results for orders placed in visit on 01/10/13  POCT CBC      Result Value Range   WBC 5.3  4.6 - 10.2 K/uL   Lymph, poc 1.5  0.6 - 3.4   POC LYMPH PERCENT 29.0  10 - 50 %L   MID (cbc) 0.3  0 - 0.9   POC MID % 6.2  0 - 12 %M   POC Granulocyte 3.4  2 - 6.9   Granulocyte percent 64.8  37 - 80 %G   RBC 3.43 (*) 4.69 - 6.13 M/uL   Hemoglobin 11.3 (*) 14.1 - 18.1 g/dL   HCT, POC 40.9 (*) 81.1 - 53.7 %   MCV 103.1 (*) 80 - 97 fL   MCH, POC 32.9 (*) 27 - 31.2 pg   MCHC 31.9  31.8 - 35.4 g/dL   RDW, POC 91.4     Platelet Count, POC 201  142 - 424 K/uL   MPV 7.5  0 - 99.8 fL     EKG/XRAY:   Primary read interpreted by Dr. Conley Rolls at [facility].   ASSESSMENT/PLAN: Encounter Diagnoses  Name Primary?  . Gout attack Yes  . Pain in joint, ankle and foot,  left   . Toe swelling    Most likely gout, he has been eating a lot of gout inducing foods  He has bipolar disorder, will not give steroids at this time Encourage GOUT diet Rx colcrys Labs pending: uric acid Advise patient that his Hgb was low and it was normal about 1 month ago with Dr Alwyn Ren, if he has any s/sx of fatigue, dizziness, SOB, blood in urine or stool then needs to get that rechecked sooner than when his next visit with Dr. Alwyn Ren.  F/u prn Gross sideeffects, risk and benefits, and alternatives of medications d/w patient. Patient is aware that all medications have potential sideeffects and we are unable to predict every sideeffect or drug-drug interaction that may occur.  LE, THAO PHUONG, DO 01/10/2013 8:50 AM  Spoke to patient about elevated uric acid, he feels much better on colcrys, trying to cut down on proteins and dairy for now. 01/12/2013.

## 2013-01-10 NOTE — Patient Instructions (Signed)
Gout  Gout is an inflammatory condition (arthritis) caused by a buildup of uric acid crystals in the joints. Uric acid is a chemical that is normally present in the blood. Under some circumstances, uric acid can form into crystals in your joints. This causes joint redness, soreness, and swelling (inflammation). Repeat attacks are common. Over time, uric acid crystals can form into masses (tophi) near a joint, causing disfigurement. Gout is treatable and often preventable.  CAUSES   The disease begins with elevated levels of uric acid in the blood. Uric acid is produced by your body when it breaks down a naturally found substance called purines. This also happens when you eat certain foods such as meats and fish. Causes of an elevated uric acid level include:   Being passed down from parent to child (heredity).   Diseases that cause increased uric acid production (obesity, psoriasis, some cancers).   Excessive alcohol use.   Diet, especially diets rich in meat and seafood.   Medicines, including certain cancer-fighting drugs (chemotherapy), diuretics, and aspirin.   Chronic kidney disease. The kidneys are no longer able to remove uric acid well.   Problems with metabolism.  Conditions strongly associated with gout include:   Obesity.   High blood pressure.   High cholesterol.   Diabetes.  Not everyone with elevated uric acid levels gets gout. It is not understood why some people get gout and others do not. Surgery, joint injury, and eating too much of certain foods are some of the factors that can lead to gout.  SYMPTOMS    An attack of gout comes on quickly. It causes intense pain with redness, swelling, and warmth in a joint.   Fever can occur.   Often, only one joint is involved. Certain joints are more commonly involved:   Base of the big toe.   Knee.   Ankle.   Wrist.   Finger.  Without treatment, an attack usually goes away in a few days to weeks. Between attacks, you usually will not have  symptoms, which is different from many other forms of arthritis.  DIAGNOSIS   Your caregiver will suspect gout based on your symptoms and exam. Removal of fluid from the joint (arthrocentesis) is done to check for uric acid crystals. Your caregiver will give you a medicine that numbs the area (local anesthetic) and use a needle to remove joint fluid for exam. Gout is confirmed when uric acid crystals are seen in joint fluid, using a special microscope. Sometimes, blood, urine, and X-ray tests are also used.  TREATMENT   There are 2 phases to gout treatment: treating the sudden onset (acute) attack and preventing attacks (prophylaxis).  Treatment of an Acute Attack   Medicines are used. These include anti-inflammatory medicines or steroid medicines.   An injection of steroid medicine into the affected joint is sometimes necessary.   The painful joint is rested. Movement can worsen the arthritis.   You may use warm or cold treatments on painful joints, depending which works best for you.   Discuss the use of coffee, vitamin C, or cherries with your caregiver. These may be helpful treatment options.  Treatment to Prevent Attacks  After the acute attack subsides, your caregiver may advise prophylactic medicine. These medicines either help your kidneys eliminate uric acid from your body or decrease your uric acid production. You may need to stay on these medicines for a very long time.  The early phase of treatment with prophylactic medicine can be associated   with an increase in acute gout attacks. For this reason, during the first few months of treatment, your caregiver may also advise you to take medicines usually used for acute gout treatment. Be sure you understand your caregiver's directions.  You should also discuss dietary treatment with your caregiver. Certain foods such as meats and fish can increase uric acid levels. Other foods such as dairy can decrease levels. Your caregiver can give you a list of foods  to avoid.  HOME CARE INSTRUCTIONS    Do not take aspirin to relieve pain. This raises uric acid levels.   Only take over-the-counter or prescription medicines for pain, discomfort, or fever as directed by your caregiver.   Rest the joint as much as possible. When in bed, keep sheets and blankets off painful areas.   Keep the affected joint raised (elevated).   Use crutches if the painful joint is in your leg.   Drink enough water and fluids to keep your urine clear or pale yellow. This helps your body get rid of uric acid. Do not drink alcoholic beverages. They slow the passage of uric acid.   Follow your caregiver's dietary instructions. Pay careful attention to the amount of protein you eat. Your daily diet should emphasize fruits, vegetables, whole grains, and fat-free or low-fat milk products.   Maintain a healthy body weight.  SEEK MEDICAL CARE IF:    You have an oral temperature above 102 F (38.9 C).   You develop diarrhea, vomiting, or any side effects from medicines.   You do not feel better in 24 hours, or you are getting worse.  SEEK IMMEDIATE MEDICAL CARE IF:    Your joint becomes suddenly more tender and you have:   Chills.   An oral temperature above 102 F (38.9 C), not controlled by medicine.  MAKE SURE YOU:    Understand these instructions.   Will watch your condition.   Will get help right away if you are not doing well or get worse.  Document Released: 04/10/2000 Document Revised: 07/06/2011 Document Reviewed: 07/22/2009  ExitCare Patient Information 2014 [employer].

## 2013-08-21 ENCOUNTER — Encounter: Payer: Self-pay | Admitting: Internal Medicine

## 2013-09-11 ENCOUNTER — Encounter: Payer: Self-pay | Admitting: Internal Medicine

## 2013-09-19 ENCOUNTER — Other Ambulatory Visit: Payer: Self-pay | Admitting: Internal Medicine

## 2013-09-19 NOTE — Telephone Encounter (Signed)
OK X1 

## 2013-10-11 ENCOUNTER — Encounter: Payer: Self-pay | Admitting: Podiatry

## 2013-10-11 ENCOUNTER — Ambulatory Visit (INDEPENDENT_AMBULATORY_CARE_PROVIDER_SITE_OTHER): Payer: Medicare Other

## 2013-10-11 ENCOUNTER — Ambulatory Visit (INDEPENDENT_AMBULATORY_CARE_PROVIDER_SITE_OTHER): Payer: Medicare Other | Admitting: Podiatry

## 2013-10-11 VITALS — BP 113/69 | HR 62 | Temp 96.4°F | Resp 12

## 2013-10-11 DIAGNOSIS — R52 Pain, unspecified: Secondary | ICD-10-CM

## 2013-10-11 DIAGNOSIS — L02619 Cutaneous abscess of unspecified foot: Secondary | ICD-10-CM

## 2013-10-11 DIAGNOSIS — L03039 Cellulitis of unspecified toe: Secondary | ICD-10-CM

## 2013-10-11 MED ORDER — CEPHALEXIN 500 MG PO CAPS
500.0000 mg | ORAL_CAPSULE | Freq: Four times a day (QID) | ORAL | Status: DC
Start: 2013-10-11 — End: 2013-11-08

## 2013-10-11 NOTE — Patient Instructions (Signed)
Contact primary care Dr. for evaluation of possible tophaceous gout in second and third right toes. Take 1 week of antibiotics

## 2013-10-11 NOTE — Progress Notes (Signed)
   Subjective:    Patient ID: Jeffrey Acosta, male    DOB: 01-07-1951, 63 y.o.   MRN: [id number]  HPI  PT STATED ''GROWTH ON THE 2ND AND 3RD TOE RT FOOT HAVE PUS, REDNESS, AND BURNING SENSATION FOR 4 MONTHS. THE TOES ARE GETTING WORSE AND VERY SORE WHEN PUTTING PRESSURE ON IT. TRIED TO USED ALCOHOL AND NEOSPORIN AND IT HELP SOME.  He describes a a white toothpaste-like substance coming from the second and third right toes. Patient does have history of gout attack, however, is not taking any current medications including colchicine.  Review of Systems  Musculoskeletal: Positive for back pain and joint swelling.  All other systems reviewed and are negative.      Objective:   Physical Exam  Orientated x3 space white male  Vascular: DP and PT pulses 2/4 bilaterally  Neurological: Sensation to 10 g monofilament wire intact 5/5 right and 4/5 left Ankle reflexes equal and reactive bilaterally Vibratory sensation intact bilaterally  Dermatological: The distal second and third right toes are edematous with a white distorted appearance. The distal toes are also mildly erythematous and slightly tender to palpation.  Musculoskeletal: Bilateral HAV deformities  X-ray report right foot  Increased soft tissue density noted from the proximal interphalangeal joint distally on the second and third toes.  No cortical disruption noted in the areas of increased soft tissue density in the second and third right toes  HAV deformity noted  Radiographic impression:  No acute bony abnormality noted  Increased soft tissue density noted second third right toes correlate with clinical findings.     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  After  patient has evaluation by primary care physician and control of probable gouty like symptoms I suggested the patient return after completion of antibiotics consider the possibility of expressing some of the tophaceous debris from the second third right toes  Reappoint after patient has had medical consult.

## 2013-10-13 ENCOUNTER — Other Ambulatory Visit: Payer: Self-pay | Admitting: Internal Medicine

## 2013-10-13 ENCOUNTER — Telehealth: Payer: Self-pay | Admitting: *Deleted

## 2013-10-13 NOTE — Telephone Encounter (Signed)
Please give [place] a call back.  I returned her call.  She stated patient said he was supposed to have gotten a prescription for gout as well as the antibiotic.  I checked Dr. Phoebe Perch notes and he said patient needs to have an evaluation by his primary care doctor about his Gout.  Primary Care doctor is who prescribed the Colchicine previously.  She said she will contact the patient and let him know.

## 2013-11-08 ENCOUNTER — Ambulatory Visit (AMBULATORY_SURGERY_CENTER): Payer: Medicare Other | Admitting: *Deleted

## 2013-11-08 VITALS — Ht 73.0 in | Wt 181.0 lb

## 2013-11-08 DIAGNOSIS — Z1211 Encounter for screening for malignant neoplasm of colon: Secondary | ICD-10-CM

## 2013-11-08 MED ORDER — MOVIPREP 100 G PO SOLR: ORAL | Status: DC

## 2013-11-08 NOTE — Progress Notes (Signed)
No allergies to eggs or soy. No problems with anesthesia.  Pt given Emmi instructions for colonoscopy  No oxygen use  No diet drug use  

## 2013-11-13 ENCOUNTER — Encounter: Payer: Self-pay | Admitting: Internal Medicine

## 2013-11-22 ENCOUNTER — Encounter: Payer: Self-pay | Admitting: Internal Medicine

## 2013-11-22 ENCOUNTER — Ambulatory Visit (AMBULATORY_SURGERY_CENTER): Payer: Medicare Other | Admitting: Internal Medicine

## 2013-11-22 VITALS — BP 137/91 | HR 61 | Temp 96.9°F | Resp 12 | Ht 73.0 in | Wt 181.0 lb

## 2013-11-22 DIAGNOSIS — Z1211 Encounter for screening for malignant neoplasm of colon: Secondary | ICD-10-CM

## 2013-11-22 MED ORDER — SODIUM CHLORIDE 0.9 % IV SOLN: 500.0000 mL | INTRAVENOUS | Status: DC

## 2013-11-22 NOTE — Progress Notes (Signed)
A/ox3 pleased with MAC, report to Annette RN 

## 2013-11-22 NOTE — Op Note (Signed)
[facility]  [address] [place], [id number]   COLONOSCOPY PROCEDURE REPORT  PATIENT: Jeffrey Acosta, Jeffrey Acosta  MR#: [id number] BIRTHDATE: [birth year] , 51  yrs. old GENDER: Male ENDOSCOPIST: Eustace Quail, MD REFERRED QB:[id number] Recall, PROCEDURE DATE:  11/22/2013 PROCEDURE:   Colonoscopy, screening First Screening Colonoscopy - Avg.  risk and is 50 yrs.  old or older - No.  Prior Negative Screening - Now for repeat screening. 10 or more years since last screening  History of Adenoma - Now for follow-up colonoscopy & has been > or = to 3 yrs.  N/A  Polyps Removed Today? No.  Recommend repeat exam, <10 yrs? No. ASA CLASS:   Class II INDICATIONS:average risk screening.   Negative index exam (DRP) 08-2003 MEDICATIONS: MAC sedation, administered by CRNA and propofol (Diprivan) 260mg  IV  DESCRIPTION OF PROCEDURE:   After the risks benefits and alternatives of the procedure were thoroughly explained, informed consent was obtained.  A digital rectal exam revealed no abnormalities of the rectum.   The [id number]  endoscope was introduced through the anus and advanced to the cecum, which was identified by both the appendix and ileocecal valve. No adverse events experienced.   The quality of the prep was excellent, using MoviPrep  The instrument was then slowly withdrawn as the colon was fully examined.   COLON FINDINGS: Moderate diverticulosis was noted  in the left colon.   The colon was otherwise normal.  There was no inflammation, polyps or cancers unless previously stated. Retroflexed views revealed internal hemorrhoids. The time to cecum=2 minutes 32 seconds.  Withdrawal time=9 minutes 29 seconds. The scope was withdrawn and the procedure completed.  COMPLICATIONS: There were no complications.  ENDOSCOPIC IMPRESSION: 1.   Moderate diverticulosis was noted in the left colon 2.   The colon was otherwise normal  RECOMMENDATIONS: 1. Continue current  colorectal screening recommendations for "routine risk" patients with a repeat colonoscopy in 10 years.   eSigned:  Eustace Quail, MD 11/22/2013 9:44 AM   cc: Hendricks Limes, MD and The Patient

## 2013-11-22 NOTE — Progress Notes (Signed)
No complaints noted in the recovery room. Maw   

## 2013-11-22 NOTE — Patient Instructions (Signed)
Handouts were given to your care partner on diverticulosis and a high fiber diet with liberal fluid intake. You may resume your current medications today. Please call if any questions or concerns.     YOU HAD AN ENDOSCOPIC PROCEDURE TODAY AT [facility]: Refer to the procedure report that was given to you for any specific questions about what was found during the examination.  If the procedure report does not answer your questions, please call your gastroenterologist to clarify.  If you requested that your care partner not be given the details of your procedure findings, then the procedure report has been included in a sealed envelope for you to review at your convenience later.  YOU SHOULD EXPECT: Some feelings of bloating in the abdomen. Passage of more gas than usual.  Walking can help get rid of the air that was put into your GI tract during the procedure and reduce the bloating. If you had a lower endoscopy (such as a colonoscopy or flexible sigmoidoscopy) you may notice spotting of blood in your stool or on the toilet paper. If you underwent a bowel prep for your procedure, then you may not have a normal bowel movement for a few days.  DIET: Your first meal following the procedure should be a light meal and then it is ok to progress to your normal diet.  A half-sandwich or bowl of soup is an example of a good first meal.  Heavy or fried foods are harder to digest and may make you feel nauseous or bloated.  Likewise meals heavy in dairy and vegetables can cause extra gas to form and this can also increase the bloating.  Drink plenty of fluids but you should avoid alcoholic beverages for 24 hours.  ACTIVITY: Your care partner should take you home directly after the procedure.  You should plan to take it easy, moving slowly for the rest of the day.  You can resume normal activity the day after the procedure however you should NOT DRIVE or use heavy machinery for 24 hours (because of  the sedation medicines used during the test).    SYMPTOMS TO REPORT IMMEDIATELY: A gastroenterologist can be reached at any hour.  During normal business hours, 8:30 AM to 5:00 PM Monday through Friday, call [phone].  After hours and on weekends, please call the GI answering service at [phone] who will take a message and have the physician on call contact you.   Following lower endoscopy (colonoscopy or flexible sigmoidoscopy):  Excessive amounts of blood in the stool  Significant tenderness or worsening of abdominal pains  Swelling of the abdomen that is new, acute  Fever of 100F or higher    FOLLOW UP: If any biopsies were taken you will be contacted by phone or by letter within the next 1-3 weeks.  Call your gastroenterologist if you have not heard about the biopsies in 3 weeks.  Our staff will call the home number listed on your records the next business day following your procedure to check on you and address any questions or concerns that you may have at that time regarding the information given to you following your procedure. This is a courtesy call and so if there is no answer at the home number and we have not heard from you through the emergency physician on call, we will assume that you have returned to your regular daily activities without incident.  SIGNATURES/CONFIDENTIALITY: You and/or your care partner have signed paperwork which will  be entered into your electronic medical record.  These signatures attest to the fact that that the information above on your After Visit Summary has been reviewed and is understood.  Full responsibility of the confidentiality of this discharge information lies with you and/or your care-partner.

## 2013-11-23 ENCOUNTER — Telehealth: Payer: Self-pay | Admitting: *Deleted

## 2013-11-23 NOTE — Telephone Encounter (Signed)
  Follow up Call-  Call back number 11/22/2013  Post procedure Call Back phone  # [phone]  Permission to leave phone message Yes     Patient questions:  Do you have a fever, pain , or abdominal swelling? No. Pain Score  0 *  Have you tolerated food without any problems? Yes.    Have you been able to return to your normal activities? Yes.    Do you have any questions about your discharge instructions: Diet   No. Medications  No. Follow up visit  No.  Do you have questions or concerns about your Care? No.  Actions: * If pain score is 4 or above: No action needed, pain <4.

## 2014-01-02 ENCOUNTER — Encounter (INDEPENDENT_AMBULATORY_CARE_PROVIDER_SITE_OTHER): Payer: Self-pay

## 2014-01-08 ENCOUNTER — Encounter: Payer: Self-pay | Admitting: Internal Medicine

## 2014-01-08 ENCOUNTER — Ambulatory Visit (INDEPENDENT_AMBULATORY_CARE_PROVIDER_SITE_OTHER): Payer: Medicare Other | Admitting: Internal Medicine

## 2014-01-08 ENCOUNTER — Other Ambulatory Visit (INDEPENDENT_AMBULATORY_CARE_PROVIDER_SITE_OTHER): Payer: Medicare Other

## 2014-01-08 VITALS — BP 120/78 | HR 100 | Temp 97.9°F | Ht 73.0 in | Wt 185.0 lb

## 2014-01-08 DIAGNOSIS — M109 Gout, unspecified: Secondary | ICD-10-CM

## 2014-01-08 DIAGNOSIS — E559 Vitamin D deficiency, unspecified: Secondary | ICD-10-CM

## 2014-01-08 DIAGNOSIS — N289 Disorder of kidney and ureter, unspecified: Secondary | ICD-10-CM

## 2014-01-08 DIAGNOSIS — E781 Pure hyperglyceridemia: Secondary | ICD-10-CM

## 2014-01-08 DIAGNOSIS — D649 Anemia, unspecified: Secondary | ICD-10-CM

## 2014-01-08 DIAGNOSIS — Z23 Encounter for immunization: Secondary | ICD-10-CM

## 2014-01-08 DIAGNOSIS — M10071 Idiopathic gout, right ankle and foot: Secondary | ICD-10-CM

## 2014-01-08 LAB — CBC WITH DIFFERENTIAL/PLATELET
Basophils Absolute: 0 10*3/uL (ref 0.0–0.1)
Basophils Relative: 0.3 % (ref 0.0–3.0)
Eosinophils Absolute: 0.4 10*3/uL (ref 0.0–0.7)
Eosinophils Relative: 3.2 % (ref 0.0–5.0)
HCT: 42.2 % (ref 39.0–52.0)
Hemoglobin: 14.3 g/dL (ref 13.0–17.0)
Lymphocytes Relative: 15 % (ref 12.0–46.0)
Lymphs Abs: 2 10*3/uL (ref 0.7–4.0)
MCHC: 33.8 g/dL (ref 30.0–36.0)
MCV: 93.3 fl (ref 78.0–100.0)
Monocytes Absolute: 0.6 10*3/uL (ref 0.1–1.0)
Monocytes Relative: 4.3 % (ref 3.0–12.0)
Neutro Abs: 10.3 10*3/uL — ABNORMAL HIGH (ref 1.4–7.7)
Neutrophils Relative %: 77.2 % — ABNORMAL HIGH (ref 43.0–77.0)
Platelets: 276 10*3/uL (ref 150.0–400.0)
RBC: 4.52 Mil/uL (ref 4.22–5.81)
RDW: 13 % (ref 11.5–15.5)
WBC: 13.4 10*3/uL — ABNORMAL HIGH (ref 4.0–10.5)

## 2014-01-08 LAB — HEPATIC FUNCTION PANEL
ALT: 16 U/L (ref 0–53)
AST: 19 U/L (ref 0–37)
Albumin: 4.3 g/dL (ref 3.5–5.2)
Alkaline Phosphatase: 72 U/L (ref 39–117)
Bilirubin, Direct: 0.1 mg/dL (ref 0.0–0.3)
Total Bilirubin: 1 mg/dL (ref 0.2–1.2)
Total Protein: 7.3 g/dL (ref 6.0–8.3)

## 2014-01-08 LAB — LIPID PANEL
Cholesterol: 160 mg/dL (ref 0–200)
HDL: 35.9 mg/dL — ABNORMAL LOW (ref >39.00)
LDL Cholesterol: 99 mg/dL (ref 0–99)
NonHDL: 124.1
Total CHOL/HDL Ratio: 4
Triglycerides: 127 mg/dL (ref 0.0–149.0)
VLDL: 25.4 mg/dL (ref 0.0–40.0)

## 2014-01-08 LAB — BASIC METABOLIC PANEL
BUN: 19 mg/dL (ref 6–23)
CO2: 24 mEq/L (ref 19–32)
Calcium: 9.9 mg/dL (ref 8.4–10.5)
Chloride: 106 mEq/L (ref 96–112)
Creatinine, Ser: 1.3 mg/dL (ref 0.4–1.5)
GFR: 58.83 mL/min — ABNORMAL LOW (ref >60.00)
Glucose, Bld: 99 mg/dL (ref 70–99)
Potassium: 4.7 mEq/L (ref 3.5–5.1)
Sodium: 136 mEq/L (ref 135–145)

## 2014-01-08 LAB — VITAMIN D 25 HYDROXY (VIT D DEFICIENCY, FRACTURES): VITD: 28.48 ng/mL — ABNORMAL LOW (ref 30.00–100.00)

## 2014-01-08 LAB — IBC PANEL
Iron: 76 ug/dL (ref 42–165)
Saturation Ratios: 21.7 % (ref 20.0–50.0)
Transferrin: 250.7 mg/dL (ref 212.0–360.0)

## 2014-01-08 LAB — URIC ACID: Uric Acid, Serum: 8.4 mg/dL — ABNORMAL HIGH (ref 4.0–7.8)

## 2014-01-08 LAB — TSH: TSH: 6.25 u[IU]/mL — ABNORMAL HIGH (ref 0.35–4.50)

## 2014-01-08 NOTE — Assessment & Plan Note (Signed)
CBC & dif IBC

## 2014-01-08 NOTE — Progress Notes (Signed)
Subjective:    Patient ID: Jeffrey Acosta, male    DOB: 1950-05-12, 63 y.o.   MRN: [id number]  HPI He is here to assess active health issues & conditions. PMH, FH, & Social history verified & updated     Review of Systems  Since November 2014 he's been treated on 3 occasions for apparent gout. This involves the right foot with tophi and one lesion which has had production of pus and blood. He's received antibiotics on 2 occasions for this.  His wife is worried about some tremor and shuffling gait possibly suggestive of Parkinson's disease.  She also is concerned about possible hearing loss. Additionally he feels Cialis is ineffective for his erectile dysfunction.     Objective:   Physical Exam   Positive pertinent findings include: He has pattern baldness. He also has a mustache and beard. He has vertical nystagmus with extraocular motion testing There is slight hearing loss bilaterally. An S4 with slurring is present.  Posterior tibial pulses are decreased Pes planus present. The left great toe overlaps the second left toe. 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 Hyperreflexia is suggested clinically. Hydrocele is present on the right.    Gen.: Thin but healthy and well-nourished in appearance. Alert, appropriate and cooperative throughout exam. Head: Normocephalic without obvious abnormalities Eyes: No corneal or conjunctival inflammation noted. Pupils equal round reactive to light and accommodation. Extraocular motion intact.  Ears: External  ear exam reveals no significant lesions or deformities. Canals clear .TMs normal.  Nose: External nasal exam reveals no deformity or inflammation. Nasal mucosa are pink and moist. No lesions or exudates noted.   Mouth: Oral mucosa and oropharynx reveal no lesions or exudates. Teeth in good repair. Neck: No deformities,  masses, or tenderness noted. Range of motion &. Thyroid normal. Lungs: Normal respiratory effort; chest expands symmetrically. Lungs are clear to auscultation without rales, wheezes, or increased work of breathing. Heart: Normal rate and rhythm. Normal S1 and S2. No gallop, click, or rub.  Abdomen: Bowel sounds normal; abdomen soft and nontender. No masses, organomegaly or hernias noted. Genitalia: Prostate is normal without enlargement, asymmetry, nodularity, or induration                        Musculoskeletal/extremities: No deformity or scoliosis noted of  the thoracic or lumbar spine.  No clubbing, cyanosis, edema, or significant extremity  deformity noted. Range of motion normal .Tone & strength normal. Hand joints normal Fingernail  health good. Able to lie down & sit up w/o help. Negative SLR bilaterally Vascular: Carotid, radial artery, dorsalis pedis and  posterior tibial pulses are equal. No bruits present. Neurologic: Alert and oriented x3. Deep tendon reflexes symmetrical . No mask facies Gait normal .     Skin: Intact without suspicious lesions or rashes. Lymph: No cervical, axillary, or inguinal lymphadenopathy present. Psych: Mood and affect are normal. Normally interactive  Assessment & Plan:  See Current Assessment & Plan in Problem List under specific Diagnosis

## 2014-01-08 NOTE — Assessment & Plan Note (Signed)
Lipids, LFTs, TSH  

## 2014-01-08 NOTE — Progress Notes (Signed)
Pre visit review using our clinic review tool, if applicable. No additional management support is needed unless otherwise documented below in the visit note. 

## 2014-01-08 NOTE — Patient Instructions (Signed)
Your next office appointment will be determined based upon review of your pending labs. Those instructions will be transmitted to you through My Chart . 

## 2014-01-08 NOTE — Assessment & Plan Note (Signed)
BMET 

## 2014-01-08 NOTE — Assessment & Plan Note (Signed)
Uric acid Films

## 2014-01-08 NOTE — Assessment & Plan Note (Signed)
Vitamin D level 

## 2014-01-09 ENCOUNTER — Other Ambulatory Visit: Payer: Self-pay | Admitting: Internal Medicine

## 2014-01-09 DIAGNOSIS — E039 Hypothyroidism, unspecified: Secondary | ICD-10-CM

## 2014-01-09 MED ORDER — LEVOTHYROXINE SODIUM 50 MCG PO TABS: ORAL_TABLET | ORAL | Status: DC

## 2014-02-09 ENCOUNTER — Other Ambulatory Visit: Payer: Self-pay

## 2014-03-17 ENCOUNTER — Emergency Department (HOSPITAL_COMMUNITY)
Admission: EM | Admit: 2014-03-17 | Discharge: 2014-03-18 | Disposition: A | Discharge status: Home / Self Care | Payer: Medicare Other | Attending: Emergency Medicine | Admitting: Emergency Medicine

## 2014-03-17 ENCOUNTER — Encounter (HOSPITAL_COMMUNITY): Payer: Self-pay | Admitting: Emergency Medicine

## 2014-03-17 DIAGNOSIS — Z8739 Personal history of other diseases of the musculoskeletal system and connective tissue: Secondary | ICD-10-CM | POA: Diagnosis not present

## 2014-03-17 DIAGNOSIS — F419 Anxiety disorder, unspecified: Secondary | ICD-10-CM | POA: Diagnosis not present

## 2014-03-17 DIAGNOSIS — Z8719 Personal history of other diseases of the digestive system: Secondary | ICD-10-CM | POA: Insufficient documentation

## 2014-03-17 DIAGNOSIS — Z87448 Personal history of other diseases of urinary system: Secondary | ICD-10-CM | POA: Insufficient documentation

## 2014-03-17 DIAGNOSIS — E039 Hypothyroidism, unspecified: Secondary | ICD-10-CM | POA: Diagnosis not present

## 2014-03-17 DIAGNOSIS — Z008 Encounter for other general examination: Secondary | ICD-10-CM | POA: Diagnosis present

## 2014-03-17 DIAGNOSIS — F10939 Alcohol use, unspecified with withdrawal, unspecified: Secondary | ICD-10-CM | POA: Diagnosis present

## 2014-03-17 DIAGNOSIS — Z8679 Personal history of other diseases of the circulatory system: Secondary | ICD-10-CM | POA: Insufficient documentation

## 2014-03-17 DIAGNOSIS — F319 Bipolar disorder, unspecified: Secondary | ICD-10-CM | POA: Diagnosis not present

## 2014-03-17 DIAGNOSIS — F10930 Alcohol use, unspecified with withdrawal, uncomplicated: Secondary | ICD-10-CM

## 2014-03-17 DIAGNOSIS — F10239 Alcohol dependence with withdrawal, unspecified: Secondary | ICD-10-CM | POA: Diagnosis present

## 2014-03-17 DIAGNOSIS — F10230 Alcohol dependence with withdrawal, uncomplicated: Secondary | ICD-10-CM | POA: Diagnosis not present

## 2014-03-17 LAB — COMPREHENSIVE METABOLIC PANEL
ALT: 16 U/L (ref 0–53)
AST: 27 U/L (ref 0–37)
Albumin: 4.6 g/dL (ref 3.5–5.2)
Alkaline Phosphatase: 93 U/L (ref 39–117)
Anion gap: 19 — ABNORMAL HIGH (ref 5–15)
BUN: 9 mg/dL (ref 6–23)
CO2: 22 mEq/L (ref 19–32)
Calcium: 9.9 mg/dL (ref 8.4–10.5)
Chloride: 100 mEq/L (ref 96–112)
Creatinine, Ser: 1.17 mg/dL (ref 0.50–1.35)
GFR calc Af Amer: 74 mL/min — ABNORMAL LOW (ref >90)
GFR calc non Af Amer: 64 mL/min — ABNORMAL LOW (ref >90)
Glucose, Bld: 91 mg/dL (ref 70–99)
Potassium: 4.3 mEq/L (ref 3.7–5.3)
Sodium: 141 mEq/L (ref 137–147)
Total Bilirubin: 0.7 mg/dL (ref 0.3–1.2)
Total Protein: 8 g/dL (ref 6.0–8.3)

## 2014-03-17 LAB — RAPID URINE DRUG SCREEN, HOSP PERFORMED
Amphetamines: NOT DETECTED
Barbiturates: NOT DETECTED
Benzodiazepines: NOT DETECTED
Cocaine: NOT DETECTED
Opiates: NOT DETECTED
Tetrahydrocannabinol: NOT DETECTED

## 2014-03-17 LAB — CBC
HCT: 48.2 % (ref 39.0–52.0)
Hemoglobin: 17.2 g/dL — ABNORMAL HIGH (ref 13.0–17.0)
MCH: 32.4 pg (ref 26.0–34.0)
MCHC: 35.7 g/dL (ref 30.0–36.0)
MCV: 90.8 fL (ref 78.0–100.0)
Platelets: 229 10*3/uL (ref 150–400)
RBC: 5.31 MIL/uL (ref 4.22–5.81)
RDW: 13.5 % (ref 11.5–15.5)
WBC: 6.7 10*3/uL (ref 4.0–10.5)

## 2014-03-17 LAB — ETHANOL: Alcohol, Ethyl (B): 284 mg/dL — ABNORMAL HIGH (ref 0–11)

## 2014-03-17 MED ORDER — ONDANSETRON 8 MG PO TBDP
8.0000 mg | ORAL_TABLET | Freq: Three times a day (TID) | ORAL | Status: DC | PRN
  Administered 2014-03-18: 8 mg via ORAL
  Filled 2014-03-17 (×3): qty 2

## 2014-03-17 MED ORDER — LORAZEPAM 1 MG PO TABS: 0.0000 mg | ORAL_TABLET | Freq: Two times a day (BID) | ORAL | Status: DC

## 2014-03-17 MED ORDER — LORAZEPAM 1 MG PO TABS
0.0000 mg | ORAL_TABLET | Freq: Four times a day (QID) | ORAL | Status: DC
  Administered 2014-03-17 – 2014-03-18 (×3): 1 mg via ORAL
  Filled 2014-03-17: qty 1
  Filled 2014-03-17 (×2): qty 2

## 2014-03-17 MED ORDER — VITAMIN B-1 100 MG PO TABS
100.0000 mg | ORAL_TABLET | Freq: Every day | ORAL | Status: DC
  Administered 2014-03-17 – 2014-03-18 (×2): 100 mg via ORAL
  Filled 2014-03-17: qty 1

## 2014-03-17 MED ORDER — THIAMINE HCL 100 MG/ML IJ SOLN: 100.0000 mg | Freq: Every day | INTRAMUSCULAR | Status: DC

## 2014-03-17 NOTE — ED Notes (Signed)
Pt stated that he takes Seroquel for sleep sometimes. Pt requested seroquel. Pt was given ativan and informed that ativan could make him sleepy.

## 2014-03-17 NOTE — ED Notes (Signed)
MD Bednar at bedside
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
## 2014-03-17 NOTE — ED Notes (Addendum)
Pt from home wishing to detox from alcohol. He has been drinking for 33 years with 13 years in remission. He has been drinking more over the past 3 weeks with last drink being this am. He drinks about 1 pint of vodka a day. He goes to [employer] 11 times per week. He is voicing concern about SI denies HI

## 2014-03-17 NOTE — ED Provider Notes (Signed)
CSN: [id number]     Arrival date & time 03/17/14  1743 History   First MD Initiated Contact with Patient 03/17/14 1859     Chief Complaint  Patient presents with  . Medical Clearance     (Consider location/radiation/quality/duration/timing/severity/associated sxs/prior Treatment) HPI 63 year old male with history of alcoholism has been through multiple detox programs previously and states he has had delirium tremens with hallucinations previously, last alcohol use today, also has bipolar disorder with depression with suicidal ideation today without plan, denies homicidal ideation denies hallucinations denies withdrawal symptoms yet today. Past Medical History  Diagnosis Date  . 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 Hx of migraines   . Hydrocele   . Diverticulosis of colon (without mention of hemorrhage)   . Anxiety   . GERD (gastroesophageal reflux disease)   . Hiatal hernia   . Bipolar affective disorder   . Depression   . Substance abuse   . Osteopenia   . Hypothyroidism    Past Surgical History  Procedure Laterality Date  . Colonoscopy  2003    negative ; Dr Sharlett Iles  . Upper gastrointestinal endoscopy  1983    hiatal hernia  . 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 Vasectomy  1979  . Tonsillectomy  1951  . Melanoma excision  07/28/11    Dr Sarajane Jews; stomach and chest  . Inguinal hernia repair  08/07/2011    Procedure: HERNIA REPAIR INGUINAL ADULT;  Surgeon: Rolm Bookbinder, MD;  Location: [facility];  Service: General;  Laterality: Right;   Family History  Problem Relation Age of Onset  . COPD Father   . Heart attack Father 50  . COPD Mother   . Colon cancer Neg Hx   . Diabetes Paternal Grandmother   . Parkinsonism Paternal Grandmother   . Stroke Paternal Grandmother 52  . Stroke Maternal Grandfather 72   History  Substance Use Topics  . Smoking status: Never Smoker   . Smokeless tobacco: Never Used  . Alcohol Use: No   Comment: former alcoholic; now in remission    Review of Systems 10 Systems reviewed and are negative for acute change except as noted in the HPI.   Allergies  Omnipaque  Home Medications   Prior to Admission medications   Medication Sig Start Date End Date Taking? Authorizing Provider  allopurinol (ZYLOPRIM) 100 MG tablet Take 200 mg by mouth daily.   Yes Historical Provider, MD  colchicine 0.6 MG tablet Take 0.6 mg by mouth 2 (two) times daily as needed.   Yes Historical Provider, MD  levothyroxine (SYNTHROID, LEVOTHROID) 50 MCG tablet Take 50 mcg by mouth daily before breakfast.   Yes Historical Provider, MD  lithium 300 MG tablet Take 600 mg by mouth 2 (two) times daily.   Yes Historical Provider, MD  mesalamine (APRISO) 0.375 G 24 hr capsule Take 1.5 g by mouth daily as needed (for IBS).    Yes Historical Provider, MD  naltrexone (DEPADE) 50 MG tablet Take 50 mg by mouth daily.     Yes Historical Provider, MD  QUEtiapine (SEROQUEL) 100 MG tablet Take 100-400 mg by mouth at bedtime.  12/18/13  Yes Historical Provider, MD   BP 159/96 mmHg  Pulse 84  Temp(Src) 97.5 F (36.4 C) (Oral)  Resp 20  Ht 6\' 1"  (1.854 m)  Wt 175 lb (79.379 kg)  BMI 23.09 kg/m2  SpO2 100% Physical Exam  Constitutional: He is oriented to  person, place, and time.  Awake, alert, nontoxic appearance.  HENT:  Head: Atraumatic.  Eyes: Right eye exhibits no discharge. Left eye exhibits no discharge.  Neck: Neck supple.  Cardiovascular: Normal rate and regular rhythm.   No murmur heard. Pulmonary/Chest: Effort normal and breath sounds normal. No respiratory distress. He has no wheezes. He has no rales. He exhibits no tenderness.  Abdominal: Soft. Bowel sounds are normal. He exhibits no distension. There is no tenderness. There is no rebound and no guarding.  Musculoskeletal: He exhibits no tenderness.  Baseline ROM, no obvious new focal weakness.  Neurological: He is alert and oriented to person, place,  and time.  Mental status and motor strength appears baseline for patient and situation.  Skin: No rash noted.  Psychiatric:  Tearful depressed with suicidal ideation without plan denies homicidal ideation denies hallucinations  Nursing note and vitals reviewed.   ED Course  Procedures (including critical care time) TTS recs [facility] or other detox InPt facility. 2245 Patient / Family / Caregiver understand and agree with initial ED impression and plan with expectations set for ED visit. Labs Review Labs Reviewed  CBC - Abnormal; Notable for the following:    Hemoglobin 17.2 (*)    All other components within normal limits  COMPREHENSIVE METABOLIC PANEL - Abnormal; Notable for the following:    GFR calc non Af Amer 64 (*)    GFR calc Af Amer 74 (*)    Anion gap 19 (*)    All other components within normal limits  ETHANOL - Abnormal; Notable for the following:    Alcohol, Ethyl (B) 284 (*)    All other components within normal limits  URINE RAPID DRUG SCREEN (HOSP PERFORMED)    Imaging Review No results found.   EKG Interpretation None      MDM   Final diagnoses:  Alcohol withdrawal, uncomplicated    Dispo pending.    Babette Relic, MD 03/22/14 [id number]

## 2014-03-18 DIAGNOSIS — F1020 Alcohol dependence, uncomplicated: Secondary | ICD-10-CM

## 2014-03-18 DIAGNOSIS — F10939 Alcohol use, unspecified with withdrawal, unspecified: Secondary | ICD-10-CM | POA: Diagnosis present

## 2014-03-18 DIAGNOSIS — F10239 Alcohol dependence with withdrawal, unspecified: Secondary | ICD-10-CM | POA: Diagnosis present

## 2014-03-18 DIAGNOSIS — F3130 Bipolar disorder, current episode depressed, mild or moderate severity, unspecified: Secondary | ICD-10-CM

## 2014-03-18 MED ORDER — MESALAMINE ER 0.375 G PO CP24: 1.5000 g | ORAL_CAPSULE | Freq: Every day | ORAL | Status: DC | PRN

## 2014-03-18 MED ORDER — MESALAMINE ER 0.375 G PO CP24: 0.3750 g | ORAL_CAPSULE | Freq: Every day | ORAL | Status: DC | PRN

## 2014-03-18 MED ORDER — ALLOPURINOL 100 MG PO TABS
200.0000 mg | ORAL_TABLET | Freq: Every day | ORAL | Status: DC
  Administered 2014-03-18: 200 mg via ORAL
  Filled 2014-03-18: qty 2

## 2014-03-18 MED ORDER — CHLORDIAZEPOXIDE HCL 25 MG PO CAPS
25.0000 mg | ORAL_CAPSULE | Freq: Four times a day (QID) | ORAL | Status: DC
  Administered 2014-03-18: 25 mg via ORAL
  Filled 2014-03-18: qty 1

## 2014-03-18 MED ORDER — HYDROXYZINE HCL 25 MG PO TABS: 25.0000 mg | ORAL_TABLET | Freq: Four times a day (QID) | ORAL | Status: DC | PRN

## 2014-03-18 MED ORDER — CHLORDIAZEPOXIDE HCL 25 MG PO CAPS: 25.0000 mg | ORAL_CAPSULE | Freq: Three times a day (TID) | ORAL | Status: DC

## 2014-03-18 MED ORDER — QUETIAPINE FUMARATE 100 MG PO TABS
100.0000 mg | ORAL_TABLET | Freq: Every day | ORAL | Status: DC
Start: 2014-03-18 — End: 2014-03-18

## 2014-03-18 MED ORDER — CHLORDIAZEPOXIDE HCL 25 MG PO CAPS: 25.0000 mg | ORAL_CAPSULE | ORAL | Status: DC

## 2014-03-18 MED ORDER — LOPERAMIDE HCL 2 MG PO CAPS: 2.0000 mg | ORAL_CAPSULE | ORAL | Status: DC | PRN

## 2014-03-18 MED ORDER — CHLORDIAZEPOXIDE HCL 25 MG PO CAPS: 25.0000 mg | ORAL_CAPSULE | Freq: Every day | ORAL | Status: DC

## 2014-03-18 MED ORDER — LITHIUM CARBONATE 300 MG PO CAPS
600.0000 mg | ORAL_CAPSULE | Freq: Two times a day (BID) | ORAL | Status: DC
  Administered 2014-03-18: 600 mg via ORAL
  Filled 2014-03-18: qty 2

## 2014-03-18 MED ORDER — LEVOTHYROXINE SODIUM 50 MCG PO TABS
50.0000 ug | ORAL_TABLET | Freq: Every day | ORAL | Status: DC
  Filled 2014-03-18: qty 1

## 2014-03-18 MED ORDER — LORAZEPAM 1 MG PO TABS
ORAL_TABLET | ORAL | Status: AC
  Administered 2014-03-18: 1 mg
  Filled 2014-03-18: qty 1

## 2014-03-18 MED ORDER — CHLORDIAZEPOXIDE HCL 25 MG PO CAPS: 25.0000 mg | ORAL_CAPSULE | Freq: Four times a day (QID) | ORAL | Status: DC | PRN
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
## 2014-03-18 NOTE — ED Notes (Signed)
Pt up at nurses station walking back and forth. States that Ativan helped with tremors. States that he just needs to stretch legs.

## 2014-03-18 NOTE — Progress Notes (Signed)
03/18/14 1650  [facility] called for transport

## 2014-03-18 NOTE — Progress Notes (Signed)
03/18/14 1710  E signature not working. Printed E signature page and patient signed form.

## 2014-03-18 NOTE — Discharge Instructions (Signed)
D/C to [facility] by [employer]

## 2014-03-18 NOTE — ED Notes (Signed)
Pt alert x 4, resting no c/o pain  cooperative  Will continue to monitor. Jeanie Sewer, RN 2:28 AM.yd

## 2014-03-18 NOTE — BH Assessment (Signed)
Jeffrey Acosta, [facility] at [facility], confirmed adult unit is at capacity. Contacted the following facilities for placement:  AT CAPACITY: [facility], per Ar[facility] End[facility], per [facility], per [facility], per [facility] per [facility], per [facility], per [facility], per [facility], per [facility], per [facility], per [facility], per [facility], per [facility], per [facility], per [facility], per [facility], per [name], per [facility], per [facility] (no acute beds)  NO RESPONSE: [employer], [place], [facility] Triage Specialist [phone]

## 2014-06-17 ENCOUNTER — Other Ambulatory Visit: Payer: Self-pay | Admitting: Internal Medicine

## 2014-07-25 ENCOUNTER — Other Ambulatory Visit: Payer: Self-pay

## 2014-07-26 ENCOUNTER — Encounter: Payer: Self-pay | Admitting: Internal Medicine

## 2014-07-26 ENCOUNTER — Ambulatory Visit (INDEPENDENT_AMBULATORY_CARE_PROVIDER_SITE_OTHER): Payer: Medicare Other | Admitting: Internal Medicine

## 2014-07-26 VITALS — BP 124/68 | HR 63 | Temp 98.1°F | Ht 73.0 in | Wt 191.2 lb

## 2014-07-26 DIAGNOSIS — R251 Tremor, unspecified: Secondary | ICD-10-CM

## 2014-07-26 DIAGNOSIS — R269 Unspecified abnormalities of gait and mobility: Secondary | ICD-10-CM

## 2014-07-26 DIAGNOSIS — F319 Bipolar disorder, unspecified: Secondary | ICD-10-CM | POA: Diagnosis not present

## 2014-07-26 DIAGNOSIS — F1021 Alcohol dependence, in remission: Secondary | ICD-10-CM | POA: Diagnosis not present

## 2014-07-26 DIAGNOSIS — R7989 Other specified abnormal findings of blood chemistry: Secondary | ICD-10-CM

## 2014-07-26 DIAGNOSIS — F1010 Alcohol abuse, uncomplicated: Secondary | ICD-10-CM

## 2014-07-26 DIAGNOSIS — E559 Vitamin D deficiency, unspecified: Secondary | ICD-10-CM

## 2014-07-26 LAB — COMPREHENSIVE METABOLIC PANEL
ALT: 7 U/L (ref 0–53)
AST: 12 U/L (ref 0–37)
Albumin: 4.3 g/dL (ref 3.5–5.2)
Alkaline Phosphatase: 115 U/L (ref 39–117)
BUN: 20 mg/dL (ref 6–23)
CO2: 27 mEq/L (ref 19–32)
Calcium: 10.4 mg/dL (ref 8.4–10.5)
Chloride: 103 mEq/L (ref 96–112)
Creatinine, Ser: 1.73 mg/dL — ABNORMAL HIGH (ref 0.40–1.50)
GFR: 42.23 mL/min — ABNORMAL LOW (ref >60.00)
Glucose, Bld: 89 mg/dL (ref 70–99)
Potassium: 4.4 mEq/L (ref 3.5–5.1)
Sodium: 133 mEq/L — ABNORMAL LOW (ref 135–145)
Total Bilirubin: 0.9 mg/dL (ref 0.2–1.2)
Total Protein: 6.7 g/dL (ref 6.0–8.3)

## 2014-07-26 LAB — CBC WITH DIFFERENTIAL/PLATELET
Basophils Absolute: 0 10*3/uL (ref 0.0–0.1)
Basophils Relative: 0.3 % (ref 0.0–3.0)
Eosinophils Absolute: 0.3 10*3/uL (ref 0.0–0.7)
Eosinophils Relative: 2.7 % (ref 0.0–5.0)
HCT: 42.9 % (ref 39.0–52.0)
Hemoglobin: 14.7 g/dL (ref 13.0–17.0)
Lymphocytes Relative: 10.4 % — ABNORMAL LOW (ref 12.0–46.0)
Lymphs Abs: 1.2 10*3/uL (ref 0.7–4.0)
MCHC: 34.3 g/dL (ref 30.0–36.0)
MCV: 92.9 fl (ref 78.0–100.0)
Monocytes Absolute: 0.6 10*3/uL (ref 0.1–1.0)
Monocytes Relative: 5.3 % (ref 3.0–12.0)
Neutro Abs: 9.6 10*3/uL — ABNORMAL HIGH (ref 1.4–7.7)
Neutrophils Relative %: 81.3 % — ABNORMAL HIGH (ref 43.0–77.0)
Platelets: 235 10*3/uL (ref 150.0–400.0)
RBC: 4.61 Mil/uL (ref 4.22–5.81)
RDW: 13.6 % (ref 11.5–15.5)
WBC: 11.8 10*3/uL — ABNORMAL HIGH (ref 4.0–10.5)

## 2014-07-26 LAB — FOLATE: Folate: 16.3 ng/mL (ref >5.9)

## 2014-07-26 LAB — T4, FREE: Free T4: 0.73 ng/dL (ref 0.60–1.60)

## 2014-07-26 LAB — T3, FREE: T3, Free: 2.1 pg/mL — ABNORMAL LOW (ref 2.3–4.2)

## 2014-07-26 LAB — TSH: TSH: 7.48 u[IU]/mL — ABNORMAL HIGH (ref 0.35–4.50)

## 2014-07-26 LAB — VITAMIN B12: Vitamin B-12: 273 pg/mL (ref 211–911)

## 2014-07-26 NOTE — Patient Instructions (Signed)
Get your blood work before you leave   Come back to the office in 1 month  for a routine check up

## 2014-07-26 NOTE — Progress Notes (Signed)
Pre visit review using our clinic review tool, if applicable. No additional management support is needed unless otherwise documented below in the visit note. 

## 2014-07-26 NOTE — Progress Notes (Deleted)
Subjective:    Patient ID: Jeffrey Acosta, male    DOB: 12/31/1950, 64 y.o.   MRN: [id number]  DOS:  07/26/2014 Type of visit - description : new pt to me, acute, several issues Interval history: For at least 7 months has noted that his speech is different from baseline, he described it as slurred. Also, wife is concerned because he has a "foggy brain" has a difficult time keeping up with the conversations. Long history of tremors, worse in the last 7 months. Problems with his gait mostly due to tremors, has fallen "legs give out", has also been shuffling his feet. History of alcohol abuse, pt reports  sober for 8.5 months but seen @ the ER 02-2014 w/ withdrawal . Concerned about a number of medications he takes   Review of Systems  Denies nausea, vomiting, diarrhea Depression well-controlled, no suicidal ideas, he is somewhat anxious but mostly because the symptoms as described above. Denies any numbness or pain in his lower extremities Denies headache, dizziness, double vision or motor deficits  Past Medical History  Diagnosis Date  . 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 Hx of migraines   . Hydrocele   . Diverticulosis of colon (without mention of hemorrhage)   . GERD (gastroesophageal reflux disease)   . Hiatal hernia   . Bipolar affective disorder   . Osteopenia   . Hypothyroidism   . HYPERLIPIDEMIA 01/10/2009    Qualifier: Diagnosis of  By: Ronnald Ramp CMA, [place]    NMR Lipoprofile 2011 LDL could not be calculated  Due to TG of 889  (811  / 750 ),  HDL 50 . Father MI @ 31 PGM CVA early 14s; PGF CVA @72    . Melanoma of skin, site unspecified 11/25/2012    07/2011 abdominal  Stage 1 Dr Amy [place] Dr Sarajane Jews     Past Surgical History  Procedure Laterality Date  . Colonoscopy  2003    negative ; Dr Sharlett Iles  . Upper gastrointestinal endoscopy  1983    hiatal hernia  . 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 Vasectomy  1979  . Tonsillectomy  1951  . Melanoma  excision  07/28/11    Dr Sarajane Jews; stomach and chest  . Inguinal hernia repair  08/07/2011    Procedure: HERNIA REPAIR INGUINAL ADULT;  Surgeon: Rolm Bookbinder, MD;  Location: [facility];  Service: General;  Laterality: Right;    History   Social History  . Marital Status: Married    Spouse Name: N/A  . Number of Children: N/A  . Years of Education: N/A   Occupational History  . Not on file.   Social History Main Topics  . Smoking status: Never Smoker   . Smokeless tobacco: Never Used  . Alcohol Use: No     Comment: former alcoholic; now in remission  . Drug Use: No  . Sexual Activity: Not on file   Other Topics Concern  . Not on file   Social History Narrative        Medication List       This list is accurate as of: 07/26/14 11:59 PM.  Always use your most recent med list.               allopurinol 100 MG tablet  Commonly known as:  ZYLOPRIM  Take 200 mg by mouth daily.     colchicine 0.6 MG tablet  TAKE 2 TABLETS BY MOUTH  AT FIRST SIGNS OF GOUT, THEN 1 TABLET IN 1 HOUR IF NO IMPROVEMENT. CONTINUE TO TAKE 1 TABLET DAILY UNTIL RESOLVED.     lamoTRIgine 100 MG tablet  Commonly known as:  LAMICTAL  Take 100 mg by mouth daily.     levothyroxine 50 MCG tablet  Commonly known as:  SYNTHROID, LEVOTHROID  Take 50 mcg by mouth daily before breakfast. Take 1 tablet daily, except 1 and 1/2 tablets on Tuesday and Thursday.     lithium 300 MG tablet  Take 600 mg by mouth 2 (two) times daily.     mesalamine 0.375 G 24 hr capsule  Commonly known as:  APRISO  Take 1.5 g by mouth daily as needed (for IBS).     naltrexone 50 MG tablet  Commonly known as:  DEPADE  Take 50 mg by mouth daily.     propranolol 20 MG tablet  Commonly known as:  INDERAL  Take 20 mg by mouth 4 (four) times daily.     QUEtiapine 100 MG tablet  Commonly known as:  SEROQUEL  Take 400 mg by mouth at bedtime.           Objective:   Physical Exam BP 124/68 mmHg   Pulse 63  Temp(Src) 98.1 F (36.7 C) (Oral)  Ht 6\' 1"  (1.854 m)  Wt 191 lb 4 oz (86.75 kg)  BMI 25.24 kg/m2  SpO2 97% General:   Well developed, well nourished . NAD.  HEENT:  Normocephalic . Face symmetric, atraumatic. No thyromegaly, good carotid pulses Lungs:  CTA B Normal respiratory effort, no intercostal retractions, no accessory muscle use. Heart: RRR,  no murmur.  Abdomen:  Not distended, soft, non-tender. No rebound or rigidity. No mass,organomegaly Muscle skeletal: no pretibial edema bilaterally  Normal pedal pulses bilaterally Skin: Not pale. Not jaundice Neurologic:  alert & oriented X3.  he is a new patient to me, his speech is not slurred. Follows commands. + Action tremor at the upper extremities, + + tremor when he transfer from sitting to standing. Gait is however appropriate for age. No shuffling today. DTRs symmetric Pinprick examination of the feet: Very mild patchy decrease. Psych--  Cognition and judgment appear intact.  Cooperative with normal attention span and concentration.  Behavior appropriate. No anxious or depressed appearing.       Assessment & Plan:   >> 40 min New patient to me, presents with the following problems: Gait disorder, history of falls Slurred speech Increase tremors h/o alcohol abuse, seen in the ER 02-2014 w/ withdrawal apparently not drinking lately  H/o bipolar disorder h/o hypothyroidism. H/o gout follow-up by Dr. Trudie Reed  Chart reviewed: Last TSH slightly elevated Saw neurology Dr Leta Baptist  [phone] with tremor, lower extremity weakness, mild postural and action tremor, decrease vibration sensation at the toes, unsteady gait. DDX at the time included essential tremor, late effect of alcohol including cerebellar degeneration and neuropathy, side effects from Depakote which he was taking then  Plan: CMP, CBC, W29, folic acid, vitamin D, PFTs, ammonia level. Further advice would results  Get MRI of the brain and  neck results  Addendum: Results show a decreased renal function, mild hyponatremia- leukocytosis,

## 2014-07-29 ENCOUNTER — Telehealth: Payer: Self-pay | Admitting: Internal Medicine

## 2014-07-29 DIAGNOSIS — R269 Unspecified abnormalities of gait and mobility: Secondary | ICD-10-CM | POA: Insufficient documentation

## 2014-07-29 DIAGNOSIS — R251 Tremor, unspecified: Secondary | ICD-10-CM | POA: Insufficient documentation

## 2014-07-29 DIAGNOSIS — F319 Bipolar disorder, unspecified: Secondary | ICD-10-CM

## 2014-07-29 LAB — VITAMIN D 1,25 DIHYDROXY
Vitamin D 1, 25 (OH)2 Total: 41 pg/mL (ref 18–72)
Vitamin D2 1, 25 (OH)2: <8 pg/mL
Vitamin D3 1, 25 (OH)2: 41 pg/mL

## 2014-07-29 NOTE — Telephone Encounter (Signed)
Advise patient: Based on the results, needs more synthroid. Renal function is decreased. Plan: Neurology referral, PT referral (orders are already in) Increase Synthroid 75 mcg daily, send a new prescription Avoid NSAIDs due to decreased renal function Get the lithium level ASAP, dx bipolar Mild leukocytosis, recheck on return to the office next office visit 3-4 weeks

## 2014-07-30 NOTE — Telephone Encounter (Signed)
LMOM informing Pt to return call.  

## 2014-07-30 NOTE — Progress Notes (Signed)
Subjective:    Patient ID: Jeffrey Acosta, male    DOB: 01-20-1951, 64 y.o.   MRN: [id number]  DOS:  07/26/2014      Type of visit - description : new pt to me, acute, several issues Interval history: For at least 7 months has noted that his speech is different from baseline, he described it as slurred. Also, wife is concerned because he has a "foggy brain" has a difficult time keeping up with the conversations. Long history of tremors, worse in the last 7 months. Problems with his gait mostly due to tremors, has fallen "legs give out", has also been shuffling his feet. History of alcohol abuse, pt reports sober for 8.5 months but seen @ the ER 02-2014 w/ withdrawal . Concerned about a number of medications he takes   Review of Systems  Denies nausea, vomiting, diarrhea Depression well-controlled, no suicidal ideas, he is somewhat anxious but mostly because the symptoms as described above. Denies any numbness or pain in his lower extremities Denies headache, dizziness, double vision or motor deficits   Review of Systems   Past Medical History  Diagnosis Date  . 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 Hx of migraines   . Hydrocele   . Diverticulosis of colon (without mention of hemorrhage)   . GERD (gastroesophageal reflux disease)   . Hiatal hernia   . Bipolar affective disorder   . Osteopenia   . Hypothyroidism   . HYPERLIPIDEMIA 01/10/2009    Qualifier: Diagnosis of  By: Ronnald Ramp CMA, Chemira    NMR Lipoprofile 2011 LDL could not be calculated  Due to TG of 889  (811  / 750 ),  HDL 50 . Father MI @ 41 PGM CVA early 37s; PGF CVA @72    . Melanoma of skin, site unspecified 11/25/2012    07/2011 abdominal  Stage 1 Dr Derrig Lacy [place] Dr Sarajane Jews     Past Surgical History  Procedure Laterality Date  . Colonoscopy  2003    negative ; Dr Sharlett Iles  . Upper gastrointestinal endoscopy  1983    hiatal hernia  . 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 Vasectomy  1979  .  Tonsillectomy  1951  . Melanoma excision  07/28/11    Dr Sarajane Jews; stomach and chest  . Inguinal hernia repair  08/07/2011    Procedure: HERNIA REPAIR INGUINAL ADULT;  Surgeon: Rolm Bookbinder, MD;  Location: [place];  Service: General;  Laterality: Right;    History   Social History  . Marital Status: Married    Spouse Name: N/A  . Number of Children: N/A  . Years of Education: N/A   Occupational History  . Not on file.   Social History Main Topics  . Smoking status: Never Smoker   . Smokeless tobacco: Never Used  . Alcohol Use: No     Comment: former alcoholic; now in remission  . Drug Use: No  . Sexual Activity: Not on file   Other Topics Concern  . Not on file   Social History Narrative     Family History  Problem Relation Age of Onset  . COPD Father   . Heart attack Father 39  . COPD Mother   . Colon cancer Neg Hx   . Diabetes Paternal Grandmother   . Parkinsonism Paternal Grandmother   . Stroke Paternal Grandmother 3  . Stroke Maternal Grandfather 72       Medication List  This list is accurate as of: 07/26/14 11:59 PM.  Always use your most recent med list.               allopurinol 100 MG tablet  Commonly known as:  ZYLOPRIM  Take 200 mg by mouth daily.     colchicine 0.6 MG tablet  TAKE 2 TABLETS BY MOUTH AT FIRST SIGNS OF GOUT, THEN 1 TABLET IN 1 HOUR IF NO IMPROVEMENT. CONTINUE TO TAKE 1 TABLET DAILY UNTIL RESOLVED.     lamoTRIgine 100 MG tablet  Commonly known as:  LAMICTAL  Take 100 mg by mouth daily.     levothyroxine 50 MCG tablet  Commonly known as:  SYNTHROID, LEVOTHROID  Take 50 mcg by mouth daily before breakfast. Take 1 tablet daily, except 1 and 1/2 tablets on Tuesday and Thursday.     lithium 300 MG tablet  Take 600 mg by mouth 2 (two) times daily.     mesalamine 0.375 G 24 hr capsule  Commonly known as:  APRISO  Take 1.5 g by mouth daily as needed (for IBS).     naltrexone 50 MG tablet  Commonly  known as:  DEPADE  Take 50 mg by mouth daily.     propranolol 20 MG tablet  Commonly known as:  INDERAL  Take 20 mg by mouth 4 (four) times daily.     QUEtiapine 100 MG tablet  Commonly known as:  SEROQUEL  Take 400 mg by mouth at bedtime.           Objective:   Physical Exam BP 124/68 mmHg  Pulse 63  Temp(Src) 98.1 F (36.7 C) (Oral)  Ht 6\' 1"  (1.854 m)  Wt 191 lb 4 oz (86.75 kg)  BMI 25.24 kg/m2  SpO2 97%  General:  Well developed, well nourished . NAD.  HEENT:  Normocephalic . Face symmetric, atraumatic. No thyromegaly, good carotid pulses Lungs:  CTA B Normal respiratory effort, no intercostal retractions, no accessory muscle use. Heart: RRR, no murmur.  Abdomen:  Not distended, soft, non-tender. No rebound or rigidity. No mass,organomegaly Muscle skeletal: no pretibial edema bilaterally  Normal pedal pulses bilaterally Skin: Not pale. Not jaundice Neurologic:  alert & oriented X3.  he is a new patient to me, his speech is not slurred. Follows commands. + Action tremor at the upper extremities, + + tremor when he transfer from sitting to standing. Gait is however appropriate for age. No shuffling today. DTRs symmetric Pinprick examination of the feet: Very mild patchy decrease. Psych--  Cognition and judgment appear intact.  Cooperative with normal attention span and concentration.  Behavior appropriate. No anxious or depressed appearing.     Assessment & Plan:    New patient to me, presents with the following problems: Gait disorder, history of falls Slurred speech Increase tremors h/o alcohol abuse, seen in the ER 02-2014 w/ withdrawal apparently not drinking lately  H/o bipolar disorder h/o hypothyroidism. H/o gout follow-up by Dr. Trudie Reed  Chart reviewed: Last TSH slightly elevated Saw neurology Dr Leta Baptist [phone] with tremor, lower extremity weakness, mild postural and action tremor, decrease vibration sensation at the  toes, unsteady gait. DDX at the time included essential tremor, late effect of alcohol including cerebellar degeneration and neuropathy, side effects from Depakote which he was taking then. Based on chart review, most of the symptoms he is describing are not new.  Plan: CMP, CBC, L87, folic acid, vitamin D, PFTs, ammonia level. Further advice would results  Get MRI of the brain  and neck results  Addendum: Results show a decreased renal function, mild hyponatremia- leukocytosis, elevated TSH, plan: Neurology referral PT referral Increase Synthroid dose Avoid NSAIDs Get that the pain level ASAP to rule out possible side effects Recheck CBC when  he comes back in 3 or 4 weeks  Today , I spent more than  45  min with the patient: >50% of the time counseling regards to multiple symptoms he has, reviewing the chart

## 2014-07-31 MED ORDER — LEVOTHYROXINE SODIUM 75 MCG PO TABS: 75.0000 ug | ORAL_TABLET | Freq: Every day | ORAL | Status: DC

## 2014-07-31 NOTE — Telephone Encounter (Signed)
Unable to get a hold of Pt, letter printed informing him of lab results and Dr. Larose Kells recommendations. Pt has neuro appt scheduled for 08/09/2014 at 1300 with Dr. Carles Collet.

## 2014-07-31 NOTE — Addendum Note (Signed)
Addended by: Janalee Dane C on: 07/31/2014 11:55 AM   Modules accepted: Orders, Medications

## 2014-08-03 DIAGNOSIS — Z0289 Encounter for other administrative examinations: Secondary | ICD-10-CM

## 2014-08-09 ENCOUNTER — Encounter: Payer: Self-pay | Admitting: Physical Therapy

## 2014-08-09 ENCOUNTER — Ambulatory Visit: Payer: Medicare Other | Attending: Internal Medicine | Admitting: Physical Therapy

## 2014-08-09 ENCOUNTER — Ambulatory Visit (INDEPENDENT_AMBULATORY_CARE_PROVIDER_SITE_OTHER): Payer: Medicare Other | Admitting: Neurology

## 2014-08-09 ENCOUNTER — Encounter: Payer: Self-pay | Admitting: Neurology

## 2014-08-09 VITALS — BP 102/70 | HR 68 | Ht 72.0 in | Wt 184.0 lb

## 2014-08-09 DIAGNOSIS — N289 Disorder of kidney and ureter, unspecified: Secondary | ICD-10-CM

## 2014-08-09 DIAGNOSIS — M259 Joint disorder, unspecified: Secondary | ICD-10-CM | POA: Insufficient documentation

## 2014-08-09 DIAGNOSIS — G253 Myoclonus: Secondary | ICD-10-CM | POA: Diagnosis not present

## 2014-08-09 DIAGNOSIS — G251 Drug-induced tremor: Secondary | ICD-10-CM | POA: Diagnosis not present

## 2014-08-09 DIAGNOSIS — R269 Unspecified abnormalities of gait and mobility: Secondary | ICD-10-CM | POA: Insufficient documentation

## 2014-08-09 DIAGNOSIS — G621 Alcoholic polyneuropathy: Secondary | ICD-10-CM | POA: Diagnosis not present

## 2014-08-09 DIAGNOSIS — R29898 Other symptoms and signs involving the musculoskeletal system: Secondary | ICD-10-CM

## 2014-08-09 DIAGNOSIS — Z7409 Other reduced mobility: Secondary | ICD-10-CM | POA: Insufficient documentation

## 2014-08-09 DIAGNOSIS — G2119 Other drug induced secondary parkinsonism: Secondary | ICD-10-CM

## 2014-08-09 NOTE — Progress Notes (Signed)
Jeffrey Acosta was seen today in the movement disorders clinic for neurologic consultation at the request of Kathlene November, MD.  This patient is accompanied in the office by his spouse who supplements the history.  The consultation is for the evaluation of tremor and gait changes.  The records that were made available to me were reviewed.   The patient previously saw Dr. Leta Baptist for the same.  Those records were reviewed.  Dr. Leta Baptist felt that the differential diagnosis of the patient's tremor included essential tremor versus Depakote educed tremor versus effects of alcohol versus the possibility of stroke.  The patient reports that he has had tremor since at least 2012-2013.  The patient reports that the tremor is located in bilateral hands and now in the shoulders; it seemed to start in the R hand first; It is most evident when he uses the hands.  His wife states that tremor did get better for a while after he underwent treatment for alcoholism and went into a treatment center several years ago.  They state that his meds were being changed at that time by Dr. Toy Care and it helped until recently.  The patient has a history of bipolar disorder and is on lithium, 600 mg twice a day (been on this since 1987), Seroquel 400 mg at night (pt admits, however, that he has been taking perhaps 600 mg + of this over the last 5-6 weeks because he has been trying to "sleep better" and thinks that some of his problems started after this - previously was only on 200 mg) and Lamictal 100 mg daily.  His psychiatrist is Dr. Toy Care.  Wife states that the dx of PD has been questioned multiple times over the last 6-7 years.    Specific Symptoms:  Tremor: Yes.     Affected by caffeine:  No. (4-6 cups coffee per day)  Affected by alcohol:  Yes.   (thinks that the tremor is worse when he drinks)  Affected by stress:  Yes.    Affected by fatigue:  Yes.    Spills soup if on spoon:  Yes.    Spills glass of liquid if full:  Yes.      Affects ADL's (tying shoes, brushing teeth, etc):  Yes.    Any other sx: Voice: hypophonic Sleep: "sleep comes and goes"  Vivid Dreams:  No.  Acting out dreams:  No. Wet Pillows: Yes.   Postural symptoms:  Yes.  ; balance issues over the last 8 months, which includes shuffling.  Went into rehab again for alcohol in November and was sober for 90 days and then relapsed and has been free of alcohol since feb 18.    Falls?  Yes.   Bradykinesia symptoms: shuffling gait and difficulty getting out of a chair Loss of smell:  No. Loss of taste:  No. Urinary Incontinence:  No. Difficulty Swallowing:  Yes.  , rarely (with saliva) Handwriting, micrographia: Yes.   (right handed) Trouble with ADL's:  Yes.  , able to do it but slow  Trouble buttoning clothing: Yes.  , able to do it but slow Depression:  Yes.   Memory changes:  Yes.   (doesn't get lost while driving; can forget to take meds; wife does finances but always has; wife cooks) Hallucinations:  No.  visual distortions: No. N/V:  No. Lightheaded:  No.  Syncope: No. Diplopia:  No. Dyskinesia:  No.  I reviewed the patient's MRI of the brain done on 02/09/2012.  This  was essentially unremarkable.  There were a very few T2 hyperintensities noted.    ALLERGIES:   Allergies  Allergen Reactions  . Omnipaque [Iohexol]      Desc: developed hives after injection; resolved with 50 mg benadryl given to pt.     CURRENT MEDICATIONS:  Outpatient Encounter Prescriptions as of 08/09/2014  Medication Sig  . allopurinol (ZYLOPRIM) 100 MG tablet Take 200 mg by mouth daily.  . colchicine 0.6 MG tablet TAKE 2 TABLETS BY MOUTH AT FIRST SIGNS OF GOUT, THEN 1 TABLET IN 1 HOUR IF NO IMPROVEMENT. CONTINUE TO TAKE 1 TABLET DAILY UNTIL RESOLVED.  Marland Kitchen lamoTRIgine (LAMICTAL) 100 MG tablet Take 100 mg by mouth daily.  Marland Kitchen levothyroxine (SYNTHROID, LEVOTHROID) 75 MCG tablet Take 1 tablet (75 mcg total) by mouth daily.  Marland Kitchen lithium 300 MG tablet Take 600 mg by  mouth 2 (two) times daily.  . mesalamine (APRISO) 0.375 G 24 hr capsule Take 1.5 g by mouth daily as needed (for IBS).   . naltrexone (DEPADE) 50 MG tablet Take 50 mg by mouth daily.    . propranolol (INDERAL) 20 MG tablet Take 20 mg by mouth 4 (four) times daily.  . QUEtiapine (SEROQUEL) 100 MG tablet Take 400 mg by mouth at bedtime.     PAST MEDICAL HISTORY:   Past Medical History  Diagnosis Date  . 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 Hx of migraines   . Hydrocele   . Diverticulosis of colon (without mention of hemorrhage)   . GERD (gastroesophageal reflux disease)   . Hiatal hernia   . Bipolar affective disorder   . Osteopenia   . Hypothyroidism   . HYPERLIPIDEMIA 01/10/2009    Qualifier: Diagnosis of  By: Ronnald Ramp CMA, Chemira    NMR Lipoprofile 2011 LDL could not be calculated  Due to TG of 889  (811  / 750 ),  HDL 50 . Father MI @ 71 PGM CVA early 69s; PGF CVA @72    . Melanoma of skin, site unspecified 11/25/2012    07/2011 abdominal  Stage 1 Dr [place] Lacy [place] Dr Sarajane Jews     PAST SURGICAL HISTORY:   Past Surgical History  Procedure Laterality Date  . Colonoscopy  2003    negative ; Dr Sharlett Iles  . Upper gastrointestinal endoscopy  1983    hiatal hernia  . 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 Vasectomy  1979  . Tonsillectomy  1951  . Melanoma excision  07/28/11    Dr Sarajane Jews; stomach and chest  . Inguinal hernia repair  08/07/2011    Procedure: HERNIA REPAIR INGUINAL ADULT;  Surgeon: Rolm Bookbinder, MD;  Location: [place];  Service: General;  Laterality: Right;    SOCIAL HISTORY:   History   Social History  . Marital Status: Married    Spouse Name: N/A  . Number of Children: N/A  . Years of Education: N/A   Occupational History  . Not on file.   Social History Main Topics  . Smoking status: Never Smoker   . Smokeless tobacco: Never Used  . Alcohol Use: No     Comment: former alcoholic; now in remission  . Drug Use: No  . Sexual  Activity: Not on file   Other Topics Concern  . Not on file   Social History Narrative    FAMILY HISTORY:   Family Status  Relation Status Death Age  . Father Deceased  COPD, heart attack  . Mother Alive     COPD  . Sister Alive     healthy  . Sister Alive     healthy  . Son Alive     healthy    ROS:  A complete 10 system review of systems was obtained and was unremarkable apart from what is mentioned above.  PHYSICAL EXAMINATION:    VITALS:   Filed Vitals:   08/09/14 1244  BP: 102/70  Pulse: 68  Height: 6' (1.829 m)  Weight: 184 lb (83.462 kg)    GEN:  The patient appears stated age and is in NAD. HEENT:  Normocephalic, atraumatic.  The mucous membranes are moist. The superficial temporal arteries are without ropiness or tenderness. CV:  RRR Lungs:  CTAB Neck/HEME:  There are no carotid bruits bilaterally.  Neurological examination:  Orientation: The patient is alert and oriented x3. Fund of knowledge is appropriate.  Has some trouble with finer aspects of the history, and looks to his wife to provide this.  Has some difficulty naming the months backwards. Cranial nerves: There is good facial symmetry. Pupils are equal round and reactive to light bilaterally. Fundoscopic exam reveals clear margins bilaterally. Extraocular muscles are intact.  There is some spontaneous horizontal nystagmus.  The visual fields are full to confrontational testing. The speech is fluent and clear. Soft palate rises symmetrically and there is no tongue deviation. Hearing is intact to conversational tone. Sensation: Sensation is intact to light and pinprick throughout (facial, trunk, extremities).  There is decreased pinprick in the legs compared to that of the arms, but not necessarily decreased in a stocking/leg distribution.  Vibration is decreased in a distal fashion. There is no extinction with double simultaneous stimulation. There is no sensory dermatomal level identified.   Proprioception is intact at the bilateral big toe. Motor: Strength is 5/5 in the bilateral upper and lower extremities.   Shoulder shrug is equal and symmetric.  There is no pronator drift. Deep tendon reflexes: Deep tendon reflexes are 2+/4 at the bilateral biceps, triceps, brachioradialis, 2+ to 3 - at the bilateral patella and 1/4 at the bilateral achilles. Plantar responses are downgoing bilaterally.  Movement examination: Tone: There is normal tone in the bilateral upper extremities.  The tone in the lower extremities is normal.  Abnormal movements: A slight tremor can be felt in the right upper extremity, but none is seen.  He has very rare myoclonic jerks noted. Coordination:  There is no significant decremation with RAM's, with any form of rapid alternating movements, including alternating supination and pronation of the forearm, hand opening and closing, finger taps, heel taps and toe taps. Gait and Station: The patient has no significant difficulty arising out of a deep-seated chair without the use of the hands.  He does think about it for several seconds before he gets out of the chair, but is able to do it without using his hands.  He does have a stooped posture and a wide-based gait.  He is slightly short stepped.  The patient has a negative pull test.      Labs:    Chemistry      Component Value Date/Time   NA 133* 07/26/2014 0921   K 4.4 07/26/2014 0921   CL 103 07/26/2014 0921   CO2 27 07/26/2014 0921   BUN 20 07/26/2014 0921   CREATININE 1.73* 07/26/2014 0921      Component Value Date/Time   CALCIUM 10.4 07/26/2014 0921   ALKPHOS 115  07/26/2014 0921   AST 12 07/26/2014 0921   ALT 7 07/26/2014 0921   BILITOT 0.9 07/26/2014 0921     Lab Results  Component Value Date   TSH 7.48* 07/26/2014   Lab Results  Component Value Date   WBC 11.8* 07/26/2014   HGB 14.7 07/26/2014   HCT 42.9 07/26/2014   MCV 92.9 07/26/2014   PLT 235.0 07/26/2014   Lab Results  Component  Value Date   VITAMINB12 273 07/26/2014     ASSESSMENT/PLAN:  1.  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  I explained to the patient that even in patients who have normal lithium levels, tremor is very common.  I do, however, recommend that he have a lithium level done very soon.  He states that this is scheduled for Monday. 2.  Gait instability  -I do think that this is multifactorial.  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  I had a long discussion with the patient and his wife about alcoholic peripheral neuropathy and its effect on nerves and balance.  We talked about the importance of complete abstinence from alcohol.  -He has mild evidence of parkinsonism, but no evidence of idiopathic Parkinson's disease.  I do think that Seroquel could be a contributor factor, even though it does not strongly block D2 receptors.  It has, however, in dosages that he is on been shown to cause parkinsonism.  He recently increased the dosage on his own in an attempt to increase sleepiness at bedtime.  He thinks that his issues started after that.  His wife is now monitoring meds and they are on the prescribed dose.  Even in this dosage, it can, in theory, still block D2 receptors.  I did not necessarily recommend changing this medication, but rather talking with Dr. Toy Care about his symptoms.  -I am also concerned about his recent jump in creatinine along with the myoclonus (which could be a result of the increase in creatinine) and some spontaneous nystagmus.  This is yet another reason to get a lithium level.  I see nothing focal or lateralizing on his neurological examination that would cause this.  I would recommend that his creatinine be rechecked as well.  -He already has an appointment for physical therapy/balance therapy which I think is a good idea. 3.  I told the patient and his wife that if things deteriorate, despite  normalization of the above, then I would like to see him back in 6 months.  I would be happy to see him back sooner if need be.  Much greater than 50% of this 60 minute visit was spent in counseling with the patient and his wife answering questions and discussing safety.

## 2014-08-09 NOTE — Therapy (Signed)
[facility] [address]  [place], [name], [address] Phone: [phone]   Fax:  [phone]  Physical Therapy Evaluation  Patient Details  Name: Jeffrey Acosta MRN: [id number] Date of Birth: Oct 10, 1950 Referring Provider:  Colon Branch, MD  Encounter Date: 08/09/2014      PT End of Session - 08/09/14 0947    Visit Number 1   Number of Visits 12   Date for PT Re-Evaluation 09/20/14   PT Start Time 0935   PT Stop Time 1022   PT Time Calculation (min) 47 min      Past Medical History  Diagnosis Date  . 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 Hx of migraines   . Hydrocele   . Diverticulosis of colon (without mention of hemorrhage)   . GERD (gastroesophageal reflux disease)   . Hiatal hernia   . Bipolar affective disorder   . Osteopenia   . Hypothyroidism   . HYPERLIPIDEMIA 01/10/2009    Qualifier: Diagnosis of  By: Ronnald Ramp CMA, Chemira    NMR Lipoprofile 2011 LDL could not be calculated  Due to TG of 889  (811  / 750 ),  HDL 50 . Father MI @ 2 PGM CVA early 58s; PGF CVA @72    . Melanoma of skin, site unspecified 11/25/2012    07/2011 abdominal  Stage 1 Dr Amy [place] Dr Sarajane Jews     Past Surgical History  Procedure Laterality Date  . Colonoscopy  2003    negative ; Dr Sharlett Iles  . Upper gastrointestinal endoscopy  1983    hiatal hernia  . 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 Vasectomy  1979  . Tonsillectomy  1951  . Melanoma excision  07/28/11    Dr Sarajane Jews; stomach and chest  . Inguinal hernia repair  08/07/2011    Procedure: HERNIA REPAIR INGUINAL ADULT;  Surgeon: Rolm Bookbinder, MD;  Location: [facility];  Service: General;  Laterality: Right;    There were no vitals filed for this visit.  Visit Diagnosis:  Abnormality of gait - Plan: PT plan of care cert/re-cert  Impaired functional mobility, balance, gait, and endurance - Plan: PT plan of care cert/re-cert  Ankle  weakness - Plan: PT plan of care cert/re-cert      Subjective Assessment - 08/09/14 0938    Subjective Pt reports to OPPT due to increasing instability of gait over the past year or more for unknown reason. Pt with secondary c/o R ankle pain which has been present for past several months for unkown reason (could be related to gout?).  Pt also with c/o tremors/twitch which he states is localized to upper extremities and states his handriting has become illegible over the past several months.  Pt pending neuro evaluation.   Pertinent History past alcohol abuse, reports being sober since June/July 2015   Currently in Pain? Yes   Pain Score 8    Pain Location Ankle   Pain Orientation Right;Anterior;Medial   Aggravating Factors  walking, sit->stand   Pain Relieving Factors rest   Multiple Pain Sites No            [facility] PT Assessment - 08/09/14 0001    Assessment   Medical Diagnosis unsteady gait   Balance Screen   Has the patient fallen in the past 6 months Yes   How many times? 7-8   Has the patient had a decrease in  activity level because of a fear of falling?  Yes   Is the patient reluctant to leave their home because of a fear of falling?  No   Prior Function   Vocation Retired   Observation/Other Assessments   Focus on Therapeutic Outcomes (FOTO)  73% limitation   Functional Tests   Functional tests Squat   Squat   Comments FW wt shift, unsteady, depth limited to 50% parallel due to combination of c/o LBP and fear of falling   Posture/Postural Control   Posture Comments pt with very slouched posture throughout entire body (forward head, rounded shoulders and depressed scapula, increased t-spine kyphosis, posterior pelvic tilt with minimal lumbar lordosis.)   ROM / Strength   AROM / PROM / Strength Strength   Strength   Overall Strength Comments overall B LE 5/5 other than Ankle PF 3/5   Ambulation/Gait   Gait Comments gait is slow, slouched posture, maintains mild flexion B  knees with decreased foot clearance and stride length.  Tends to experience LOB with sit->stand transfers in that begins walking before upright and stumbles forward for first few steps.   Standardized Balance Assessment   Standardized Balance Assessment Berg Balance Test   Berg Balance Test   Sit to Stand Able to stand  independently using hands   Standing Unsupported Able to stand safely 2 minutes   Sitting with Back Unsupported but Feet Supported on Floor or Stool Able to sit safely and securely 2 minutes   Stand to Sit Sits safely with minimal use of hands   Transfers Able to transfer safely, minor use of hands   Standing Unsupported with Eyes Closed Able to stand 10 seconds safely   Standing Ubsupported with Feet Together Able to place feet together independently and stand for 1 minute with supervision   From Standing, Reach Forward with Outstretched Arm Can reach forward >12 cm safely (5")   From Standing Position, Pick up Object from [place] to pick up shoe safely and easily   From Standing Position, Turn to Look Behind Over each Shoulder Turn sideways only but maintains balance   Turn 360 Degrees Able to turn 360 degrees safely but slowly   Standing Unsupported, Alternately Place Feet on Step/Stool Needs assistance to keep from falling or unable to try   Standing Unsupported, One Foot in Front Able to plae foot ahead of the other independently and hold 30 seconds   Standing on One Leg Tries to lift leg/unable to hold 3 seconds but remains standing independently   Total Score 41                           PT Education - 08/09/14 1245    Education provided Yes   Education Details fall prevention recommended use of SPC and instructed pt in proper use of SPC.  He declined trying to use [facility] today stating "in my own time"   Person(s) Educated Patient   Methods Explanation;Demonstration   Comprehension Verbalized understanding          PT Short Term Goals -  08/09/14 1304    PT SHORT TERM GOAL #1   Title pt independent with initial HEP by 08/24/14   Status New           PT Long Term Goals - 08/09/14 1305    PT LONG TERM GOAL #1   Title pt scores 49/56 or better on Berg by 09/20/14   Status New  PT LONG TERM GOAL #2   Title pt displays more normal gait mechanics to include upright posture, proper foot clearance, and improved stride length and cadence by 09/20/14   Status New   PT LONG TERM GOAL #3   Title pt independent with appropriate walking AD if necessary by 09/20/14   Status New   PT LONG TERM GOAL #4   Title pt able to perform all transfers and bed mobility tasks independently and safely by 09/20/14   Status New               Plan - 08-27-2014 1257    Clinical Impression Statement Pt with significantly impaired gait and balance.  B LE strength is [facility] all planes other than B Ankle PF so this does not explain impaired gait/balance.  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  He is pending an initial neuro consult later today so I will forward this assessment to his pending MD (Dr. Carles Collet).  Mr. Kosh should make some progress with Physical Therapy with regard to balance/gait and overall functional mobillity but overall prognosis questionable until diagnosis is known.   Pt will benefit from skilled therapeutic intervention in order to improve on the following deficits Abnormal gait;Decreased coordination;Difficulty walking;Improper body mechanics;Postural dysfunction;Decreased activity tolerance;Decreased balance;Pain;Decreased strength   Rehab Potential --  fair to good pending neuro diagnosis   PT Frequency 2x / week   PT Duration 8 weeks   PT Treatment/Interventions Balance training;Gait training;Therapeutic exercise;Therapeutic activities;ADLs/Self Care Home  Management;Patient/family education;Neuromuscular re-education;Stair training   PT Next Visit Plan begin balance and gait training, revisit SPC use   Consulted and Agree with Plan of Care Patient          G-Codes - Aug 27, 2014 1248    Functional Limitation Mobility: Walking and moving around   Mobility: Walking and Moving Around Current Status [phone]) At least 60 percent but less than 80 percent impaired, limited or restricted   Mobility: Walking and Moving Around Goal Status [phone]) At least 20 percent but less than 40 percent impaired, limited or restricted       Problem List Patient Active Problem List   Diagnosis Date Noted  . Tremor 07/29/2014  . Gait disorder 07/29/2014  . Alcohol withdrawal 03/18/2014  . Gout 01/08/2014  . Unspecified vitamin D deficiency 11/25/2012  . Melanoma of skin, site unspecified 11/25/2012  . 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 Alcoholism in remission 06/19/2011  . Diverticulitis of colon (without mention of hemorrhage) 06/19/2011  . 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 OSTEOPENIA 05/17/2010  . DIVERTICULOSIS, COLON 05/16/2010  . HYPERTRIGLYCERIDEMIA 08/02/2009  . HYPERLIPIDEMIA 01/10/2009  . ABNORMAL ELECTROCARDIOGRAM 11/26/2008  . REFLUX, ESOPHAGEAL 11/17/2006  . COMMON MIGRAINE 09/22/2006    Cherith Tewell PT, OCS 2014-08-27, 1:11 PM  [facility] [address]  [place], [name], [address] Phone: [phone]   Fax:  [phone]

## 2014-08-10 NOTE — Progress Notes (Signed)
Note routed to Dr Toy Care.

## 2014-08-13 ENCOUNTER — Other Ambulatory Visit (INDEPENDENT_AMBULATORY_CARE_PROVIDER_SITE_OTHER): Payer: Medicare Other

## 2014-08-13 DIAGNOSIS — F319 Bipolar disorder, unspecified: Secondary | ICD-10-CM | POA: Diagnosis not present

## 2014-08-14 ENCOUNTER — Emergency Department (HOSPITAL_BASED_OUTPATIENT_CLINIC_OR_DEPARTMENT_OTHER)
Admission: EM | Admit: 2014-08-14 | Discharge: 2014-08-15 | Disposition: A | Discharge status: Home / Self Care | Payer: Medicare Other | Attending: Emergency Medicine | Admitting: Emergency Medicine

## 2014-08-14 ENCOUNTER — Telehealth: Payer: Self-pay | Admitting: *Deleted

## 2014-08-14 ENCOUNTER — Encounter (HOSPITAL_BASED_OUTPATIENT_CLINIC_OR_DEPARTMENT_OTHER): Payer: Self-pay

## 2014-08-14 ENCOUNTER — Ambulatory Visit: Payer: Medicare Other | Admitting: Physical Therapy

## 2014-08-14 ENCOUNTER — Emergency Department (HOSPITAL_BASED_OUTPATIENT_CLINIC_OR_DEPARTMENT_OTHER): Payer: Medicare Other

## 2014-08-14 DIAGNOSIS — N289 Disorder of kidney and ureter, unspecified: Secondary | ICD-10-CM | POA: Diagnosis not present

## 2014-08-14 DIAGNOSIS — Z79899 Other long term (current) drug therapy: Secondary | ICD-10-CM | POA: Diagnosis not present

## 2014-08-14 DIAGNOSIS — Z8719 Personal history of other diseases of the digestive system: Secondary | ICD-10-CM | POA: Insufficient documentation

## 2014-08-14 DIAGNOSIS — Z85820 Personal history of malignant melanoma of skin: Secondary | ICD-10-CM | POA: Insufficient documentation

## 2014-08-14 DIAGNOSIS — R410 Disorientation, unspecified: Secondary | ICD-10-CM | POA: Insufficient documentation

## 2014-08-14 DIAGNOSIS — Z8739 Personal history of other diseases of the musculoskeletal system and connective tissue: Secondary | ICD-10-CM | POA: Insufficient documentation

## 2014-08-14 DIAGNOSIS — R05 Cough: Secondary | ICD-10-CM | POA: Diagnosis present

## 2014-08-14 DIAGNOSIS — E039 Hypothyroidism, unspecified: Secondary | ICD-10-CM | POA: Insufficient documentation

## 2014-08-14 DIAGNOSIS — J209 Acute bronchitis, unspecified: Secondary | ICD-10-CM | POA: Insufficient documentation

## 2014-08-14 DIAGNOSIS — Z8659 Personal history of other mental and behavioral disorders: Secondary | ICD-10-CM | POA: Insufficient documentation

## 2014-08-14 DIAGNOSIS — J40 Bronchitis, not specified as acute or chronic: Secondary | ICD-10-CM

## 2014-08-14 DIAGNOSIS — Z8679 Personal history of other diseases of the circulatory system: Secondary | ICD-10-CM | POA: Diagnosis not present

## 2014-08-14 DIAGNOSIS — Z87448 Personal history of other diseases of urinary system: Secondary | ICD-10-CM | POA: Insufficient documentation

## 2014-08-14 LAB — BASIC METABOLIC PANEL
Anion gap: 8 (ref 5–15)
BUN: 24 mg/dL — ABNORMAL HIGH (ref 6–23)
CO2: 19 mmol/L (ref 19–32)
Calcium: 10.1 mg/dL (ref 8.4–10.5)
Chloride: 107 mmol/L (ref 96–112)
Creatinine, Ser: 1.9 mg/dL — ABNORMAL HIGH (ref 0.50–1.35)
GFR calc Af Amer: 41 mL/min — ABNORMAL LOW (ref >90)
GFR calc non Af Amer: 35 mL/min — ABNORMAL LOW (ref >90)
Glucose, Bld: 125 mg/dL — ABNORMAL HIGH (ref 70–99)
Potassium: 4.5 mmol/L (ref 3.5–5.1)
Sodium: 134 mmol/L — ABNORMAL LOW (ref 135–145)

## 2014-08-14 LAB — CBC WITH DIFFERENTIAL/PLATELET
Basophils Absolute: 0 10*3/uL (ref 0.0–0.1)
Basophils Relative: 0 % (ref 0–1)
Eosinophils Absolute: 0.4 10*3/uL (ref 0.0–0.7)
Eosinophils Relative: 3 % (ref 0–5)
HCT: 40.3 % (ref 39.0–52.0)
Hemoglobin: 14.3 g/dL (ref 13.0–17.0)
Lymphocytes Relative: 12 % (ref 12–46)
Lymphs Abs: 1.8 10*3/uL (ref 0.7–4.0)
MCH: 32.2 pg (ref 26.0–34.0)
MCHC: 35.5 g/dL (ref 30.0–36.0)
MCV: 90.8 fL (ref 78.0–100.0)
Monocytes Absolute: 0.7 10*3/uL (ref 0.1–1.0)
Monocytes Relative: 5 % (ref 3–12)
Neutro Abs: 11.3 10*3/uL — ABNORMAL HIGH (ref 1.7–7.7)
Neutrophils Relative %: 80 % — ABNORMAL HIGH (ref 43–77)
Platelets: 298 10*3/uL (ref 150–400)
RBC: 4.44 MIL/uL (ref 4.22–5.81)
RDW: 12.7 % (ref 11.5–15.5)
WBC: 14.2 10*3/uL — ABNORMAL HIGH (ref 4.0–10.5)

## 2014-08-14 LAB — URINALYSIS, ROUTINE W REFLEX MICROSCOPIC
Glucose, UA: NEGATIVE mg/dL
Hgb urine dipstick: NEGATIVE
Ketones, ur: NEGATIVE mg/dL
Nitrite: NEGATIVE
Protein, ur: NEGATIVE mg/dL
Specific Gravity, Urine: 1.019 (ref 1.005–1.030)
Urobilinogen, UA: 1 mg/dL (ref 0.0–1.0)
pH: 6 (ref 5.0–8.0)

## 2014-08-14 LAB — I-STAT CG4 LACTIC ACID, ED: Lactic Acid, Venous: 1.63 mmol/L (ref 0.5–2.0)

## 2014-08-14 LAB — URINE MICROSCOPIC-ADD ON

## 2014-08-14 LAB — LITHIUM LEVEL: Lithium Lvl: 2.3 mEq/L — ABNORMAL HIGH (ref 0.80–1.40)

## 2014-08-14 MED ORDER — ACETAMINOPHEN 325 MG PO TABS
650.0000 mg | ORAL_TABLET | Freq: Once | ORAL | Status: AC
  Administered 2014-08-14: 650 mg via ORAL
  Filled 2014-08-14: qty 2

## 2014-08-14 MED ORDER — LEVOFLOXACIN IN D5W 750 MG/150ML IV SOLN
750.0000 mg | Freq: Once | INTRAVENOUS | Status: AC
  Administered 2014-08-14: 750 mg via INTRAVENOUS
  Filled 2014-08-14: qty 150

## 2014-08-14 NOTE — ED Provider Notes (Signed)
CSN: [id number]     Arrival date & time 08/14/14  1732 History  This chart was scribed for Malvin Johns, MD by Jeanell Sparrow, ED Scribe. This patient was seen in room MH12/MH12 and the patient's care was started at 6:17 PM.  Chief Complaint  Patient presents with  . Altered Mental Status   The history is provided by the patient. No language interpreter was used.   HPI Comments: Jeffrey Acosta is a 64 y.o. male who presents to the Emergency Department complaining of AMS that started today. EMS states that pt was found in a parking lot by someone else and seemed confused about the location of where he was supposed to be. Wife states that pt has been intermittently confused before today, but today his confusion worsened. She reports that pt has been having tremors and gait problems that have been worsening over last few weeks. Has been seeing a neurologist and felt may be related to his seroquel or lithium.  Pt reports that he has had a mild cough the past week. Wife reports that pt has a hx of alcoholism. He denies any recent hospitalizations. He also denies any flu-like symptoms, wounds, dysuria, or fever.    Past Medical History  Diagnosis Date  . 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 Hx of migraines   . Hydrocele   . Diverticulosis of colon (without mention of hemorrhage)   . GERD (gastroesophageal reflux disease)   . Hiatal hernia   . Bipolar affective disorder   . Osteopenia   . Hypothyroidism   . HYPERLIPIDEMIA 01/10/2009    Qualifier: Diagnosis of  By: Ronnald Ramp CMA, Chemira    NMR Lipoprofile 2011 LDL could not be calculated  Due to TG of 889  (811  / 750 ),  HDL 50 . Father MI @ 45 PGM CVA early 65s; PGF CVA @72    . Melanoma of skin, site unspecified 11/25/2012    07/2011 abdominal  Stage 1 Dr [place] Dr Sarajane Jews    Past Surgical History  Procedure Laterality Date  . Colonoscopy  2003    negative ; Dr Sharlett Iles  . Upper gastrointestinal endoscopy  1983    hiatal hernia  .  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 Vasectomy  1979  . Tonsillectomy  1951  . Melanoma excision  07/28/11    Dr Sarajane Jews; stomach and chest  . Inguinal hernia repair  08/07/2011    Procedure: HERNIA REPAIR INGUINAL ADULT;  Surgeon: Rolm Bookbinder, MD;  Location: [facility];  Service: General;  Laterality: Right;   Family History  Problem Relation Age of Onset  . COPD Father   . Heart attack Father 101  . COPD Mother   . Colon cancer Neg Hx   . Diabetes Paternal Grandmother   . Parkinsonism Paternal Grandmother   . Stroke Paternal Grandmother 53  . Stroke Maternal Grandfather 72   History  Substance Use Topics  . Smoking status: Never Smoker   . Smokeless tobacco: Never Used  . Alcohol Use: No     Comment: former alcoholic; now in remission    Review of Systems  Constitutional: Negative for fever, chills, diaphoresis and fatigue.  HENT: Negative for congestion, rhinorrhea and sneezing.   Eyes: Negative.   Respiratory: Positive for cough. Negative for chest tightness and shortness of breath.   Cardiovascular: Negative for chest pain and leg swelling.  Gastrointestinal: Negative for nausea, vomiting, abdominal pain,  diarrhea and blood in stool.  Genitourinary: Negative for dysuria, frequency, hematuria, flank pain and difficulty urinating.  Musculoskeletal: Negative for back pain and arthralgias.  Skin: Negative for rash and wound.  Neurological: Negative for dizziness, speech difficulty, weakness, numbness and headaches.  Psychiatric/Behavioral: Positive for confusion.    Allergies  Omnipaque  Home Medications   Prior to Admission medications   Medication Sig Start Date End Date Taking? Authorizing Provider  allopurinol (ZYLOPRIM) 100 MG tablet Take 200 mg by mouth daily.    Historical Provider, MD  colchicine 0.6 MG tablet TAKE 2 TABLETS BY MOUTH AT FIRST SIGNS OF GOUT, THEN 1 TABLET IN 1 HOUR IF NO IMPROVEMENT. CONTINUE TO TAKE 1 TABLET  DAILY UNTIL RESOLVED. 06/18/14   Hendricks Limes, MD  doxycycline (VIBRAMYCIN) 100 MG capsule Take 1 capsule (100 mg total) by mouth 2 (two) times daily. One po bid x 7 days 08/15/14   Malvin Johns, MD  lamoTRIgine (LAMICTAL) 100 MG tablet Take 100 mg by mouth daily.    Historical Provider, MD  levothyroxine (SYNTHROID, LEVOTHROID) 75 MCG tablet Take 1 tablet (75 mcg total) by mouth daily. 07/31/14   Colon Branch, MD  lithium 300 MG tablet Take 600 mg by mouth 2 (two) times daily.    Historical Provider, MD  mesalamine (APRISO) 0.375 G 24 hr capsule Take 1.5 g by mouth daily as needed (for IBS).     Historical Provider, MD  naltrexone (DEPADE) 50 MG tablet Take 50 mg by mouth daily.      Historical Provider, MD  propranolol (INDERAL) 20 MG tablet Take 20 mg by mouth 4 (four) times daily.    Historical Provider, MD  QUEtiapine (SEROQUEL) 100 MG tablet Take 400 mg by mouth at bedtime.  12/18/13   Historical Provider, MD   BP 124/69 mmHg  Pulse 88  Temp(Src) 102.1 F (38.9 C) (Rectal)  Resp 18  Ht 6\' 1"  (1.854 m)  Wt 190 lb (86.183 kg)  BMI 25.07 kg/m2  SpO2 94% Physical Exam  Constitutional: He appears well-developed and well-nourished.  HENT:  Head: Normocephalic and atraumatic.  Eyes: Pupils are equal, round, and reactive to light.  Neck: Normal range of motion. Neck supple.  Cardiovascular: Normal rate, regular rhythm and normal heart sounds.   Pulmonary/Chest: Effort normal. No respiratory distress. He has no wheezes. He has rales (Few crackles in the bases bilaterally). He exhibits no tenderness.  Abdominal: Soft. Bowel sounds are normal. There is no tenderness. There is no rebound and no guarding.  Musculoskeletal: Normal range of motion. He exhibits no edema.  Lymphadenopathy:    He has no cervical adenopathy.  Neurological: He is alert.  Patient is alert but oriented only to person and place. Motor 5 out of 5 all extremities. Sensation grossly intact to light touch all extremities.  Finger to nose is slowed. Cranial nerves II through XII grossly intact.  Skin: Skin is warm and dry. No rash noted.  Psychiatric: He has a normal mood and affect.  Nursing note and vitals reviewed.   ED Course  Procedures (including critical care time) DIAGNOSTIC STUDIES: Oxygen Saturation is 94% on RA, normal by my interpretation.    COORDINATION OF CARE: 6:21 PM- Pt advised of plan for treatment which includes medication and labs and pt agrees.  Labs Review Labs Reviewed  CBC WITH DIFFERENTIAL/PLATELET - Abnormal; Notable for the following:    WBC 14.2 (*)    Neutrophils Relative % 80 (*)    Neutro Abs  11.3 (*)    All other components within normal limits  BASIC METABOLIC PANEL - Abnormal; Notable for the following:    Sodium 134 (*)    Glucose, Bld 125 (*)    BUN 24 (*)    Creatinine, Ser 1.90 (*)    GFR calc non Af Amer 35 (*)    GFR calc Af Amer 41 (*)    All other components within normal limits  URINALYSIS, ROUTINE W REFLEX MICROSCOPIC - Abnormal; Notable for the following:    APPearance CLOUDY (*)    Bilirubin Urine SMALL (*)    Leukocytes, UA TRACE (*)    All other components within normal limits  URINE MICROSCOPIC-ADD ON - Abnormal; Notable for the following:    Squamous Epithelial / LPF FEW (*)    All other components within normal limits  CULTURE, BLOOD (ROUTINE X 2)  CULTURE, BLOOD (ROUTINE X 2)  I-STAT CG4 LACTIC ACID, ED    Imaging Review Dg Chest 2 View  08/14/2014   CLINICAL DATA:  Altered mental status for 1 week. Confusion and weakness.  EXAM: CHEST  2 VIEW  COMPARISON:  08/05/2011  FINDINGS: Cardiac silhouette is within normal limits. Thoracic aorta is slightly tortuous. Lungs are well inflated and clear. No pleural effusion or pneumothorax is identified. No acute osseous abnormality is identified.  IMPRESSION: No active cardiopulmonary disease.   Electronically Signed   By: Logan Bores   On: 08/14/2014 19:30   Ct Head Wo Contrast  08/14/2014    CLINICAL DATA:  Altered mental status. Confusion and weakness for 1 week.  EXAM: CT HEAD WITHOUT CONTRAST  TECHNIQUE: Contiguous axial images were obtained from the base of the skull through the vertex without intravenous contrast.  COMPARISON:  Brain MRI 02/09/2012  FINDINGS: Mildly prominent, asymmetric CSF overlying the left frontal lobe measures up to 6 mm in thickness and is unchanged from the prior MRI, likely secondary to atrophy. A small amount of fat is incidentally noted in the cavernous sinuses. There is no evidence of acute cortical infarct, intracranial hemorrhage, mass, or midline shift.  Orbits are unremarkable. Visualized mastoid air cells are clear. There is minimal mucosal thickening in the maxillary sinuses. Mild bilateral carotid siphon calcification is noted.  IMPRESSION: No evidence of acute intracranial abnormality.   Electronically Signed   By: Logan Bores   On: 08/14/2014 19:34     EKG Interpretation None      MDM   Final diagnoses:  Bronchitis  Renal insufficiency    Patient presents with fever and confusion. He actually has had this confusion over the last few weeks. His wife states that it got a little bit worse today. He's had a little bit of cough but no other signs of infection. He doesn't really have other flulike symptoms. He has no headache neck pain or other suggestions of meningitis. His urine is unremarkable. His chest x-ray shows no evidence of pneumonia. His head CT is negative. He has a slight tremor but has no focal neurologic deficits. His labs show an elevated white count but his lactate is normal and he has no other markers for sepsis. I did review his labs and he had a lithium level checked by his primary care physician yesterday and it is elevated. This could explain a lot of his recent symptoms including confusion and tremor and ataxia that is been going on for the last 3-4 weeks. His creatinine is also elevated above its baseline values. I discussed  admission  however the family really wants to go home the wife and the patient both really want to go home. They feel that they can follow-up with his primary care physician tomorrow. Patient is feeling much better since he arrives once his fevers come down. He's much less confused. He still is confused as to the date but his wife states that he's much more at his baseline status. I will go ahead and treat him for possible community-acquired pneumonia. He was given dose of IV Levaquin in the ED. I did switch him to doxycycline for outpatient treatment as there is an apparent interaction with the Levaquin and Seroquel with QT prolongation. I advised him to stop taking his lithium until he is cleared to start taking again by his primary care physician. He does also have an appointment with his neurologist tomorrow. The patient and his wife are both agreeable to keep a close eye on him and return immediately if he has any worsening symptoms.  I personally performed the services described in this documentation, which was scribed in my presence.  The recorded information has been reviewed and considered.     Malvin Johns, MD 08/15/14 [phone]

## 2014-08-14 NOTE — ED Notes (Signed)
Pt found wandering in a parking lot by a bystander and appeared confused per EMS.  Pt reports he was looking for his PCP office and got confused about the location. States he had an appointment at 4:30 today for labs.

## 2014-08-14 NOTE — ED Notes (Signed)
Patient transported to CT 

## 2014-08-14 NOTE — Discharge Instructions (Signed)
Upper Respiratory Infection, Adult An upper respiratory infection (URI) is also sometimes known as the common cold. The upper respiratory tract includes the nose, sinuses, throat, trachea, and bronchi. Bronchi are the airways leading to the lungs. Most people improve within 1 week, but symptoms can last up to 2 weeks. A residual cough may last even longer.  CAUSES Many different viruses can infect the tissues lining the upper respiratory tract. The tissues become irritated and inflamed and often become very moist. Mucus production is also common. A cold is contagious. You can easily spread the virus to others by oral contact. This includes kissing, sharing a glass, coughing, or sneezing. Touching your mouth or nose and then touching a surface, which is then touched by another person, can also spread the virus. SYMPTOMS  Symptoms typically develop 1 to 3 days after you come in contact with a cold virus. Symptoms vary from person to person. They may include:  Runny nose.  Sneezing.  Nasal congestion.  Sinus irritation.  Sore throat.  Loss of voice (laryngitis).  Cough.  Fatigue.  Muscle aches.  Loss of appetite.  Headache.  Low-grade fever. DIAGNOSIS  You might diagnose your own cold based on familiar symptoms, since most people get a cold 2 to 3 times a year. Your caregiver can confirm this based on your exam. Most importantly, your caregiver can check that your symptoms are not due to another disease such as strep throat, sinusitis, pneumonia, asthma, or epiglottitis. Blood tests, throat tests, and X-rays are not necessary to diagnose a common cold, but they may sometimes be helpful in excluding other more serious diseases. Your caregiver will decide if any further tests are required. RISKS AND COMPLICATIONS  You may be at risk for a more severe case of the common cold if you smoke cigarettes, have chronic heart disease (such as heart failure) or lung disease (such as asthma), or if  you have a weakened immune system. The very young and very old are also at risk for more serious infections. Bacterial sinusitis, middle ear infections, and bacterial pneumonia can complicate the common cold. The common cold can worsen asthma and chronic obstructive pulmonary disease (COPD). Sometimes, these complications can require emergency medical care and may be life-threatening. PREVENTION  The best way to protect against getting a cold is to practice good hygiene. Avoid oral or hand contact with people with cold symptoms. Wash your hands often if contact occurs. There is no clear evidence that vitamin C, vitamin E, echinacea, or exercise reduces the chance of developing a cold. However, it is always recommended to get plenty of rest and practice good nutrition. TREATMENT  Treatment is directed at relieving symptoms. There is no cure. Antibiotics are not effective, because the infection is caused by a virus, not by bacteria. Treatment may include:  Increased fluid intake. Sports drinks offer valuable electrolytes, sugars, and fluids.  Breathing heated mist or steam (vaporizer or shower).  Eating chicken soup or other clear broths, and maintaining good nutrition.  Getting plenty of rest.  Using gargles or lozenges for comfort.  Controlling fevers with ibuprofen or acetaminophen as directed by your caregiver.  Increasing usage of your inhaler if you have asthma. Zinc gel and zinc lozenges, taken in the first 24 hours of the common cold, can shorten the duration and lessen the severity of symptoms. Pain medicines may help with fever, muscle aches, and throat pain. A variety of non-prescription medicines are available to treat congestion and runny nose. Your caregiver  can make recommendations and may suggest nasal or lung inhalers for other symptoms.  HOME CARE INSTRUCTIONS   Only take over-the-counter or prescription medicines for pain, discomfort, or fever as directed by your  caregiver.  Use a warm mist humidifier or inhale steam from a shower to increase air moisture. This may keep secretions moist and make it easier to breathe.  Drink enough water and fluids to keep your urine clear or pale yellow.  Rest as needed.  Return to work when your temperature has returned to normal or as your caregiver advises. You may need to stay home longer to avoid infecting others. You can also use a face mask and careful hand washing to prevent spread of the virus. SEEK MEDICAL CARE IF:   After the first few days, you feel you are getting worse rather than better.  You need your caregiver's advice about medicines to control symptoms.  You develop chills, worsening shortness of breath, or brown or red sputum. These may be signs of pneumonia.  You develop yellow or brown nasal discharge or pain in the face, especially when you bend forward. These may be signs of sinusitis.  You develop a fever, swollen neck glands, pain with swallowing, or white areas in the back of your throat. These may be signs of strep throat. SEEK IMMEDIATE MEDICAL CARE IF:   You have a fever.  You develop severe or persistent headache, ear pain, sinus pain, or chest pain.  You develop wheezing, a prolonged cough, cough up blood, or have a change in your usual mucus (if you have chronic lung disease).  You develop sore muscles or a stiff neck. Document Released: 10/07/2000 Document Revised: 07/06/2011 Document Reviewed: 07/19/2013 [facility] Patient Information 2015 [place], [place]. This information is not intended to replace advice given to you by your health care provider. Make sure you discuss any questions you have with your health care provider.  Kidney Failure Kidney failure happens when the kidneys cannot remove waste and excess fluid that naturally builds up in your blood after your body breaks down food. This leads to a dangerous buildup of waste products and fluid in the blood. HOME  CARE  Follow your diet as told by your doctor.  Take all medicines as told by your doctor.  Keep all of your dialysis appointments. Call if you are unable to keep an appointment. GET HELP RIGHT AWAY IF:   You make a lot more or very little pee (urine).  Your face or ankles puff up (swell).  You develop shortness of breath.  You develop weakness, feel tired, or you do not feel hungry (appetite loss).  You feel poorly for no known reason. MAKE SURE YOU:   Understand these instructions.  Will watch your condition.  Will get help right away if you are not doing well or get worse. Document Released: 07/08/2009 Document Revised: 07/06/2011 Document Reviewed: 08/14/2009 [facility] Patient Information 2015 [place], [place]. This information is not intended to replace advice given to you by your health care provider. Make sure you discuss any questions you have with your health care provider.

## 2014-08-14 NOTE — Telephone Encounter (Signed)
EMT calling stating they were called for patient.  He arrived at [facility] thinking he had a doctors appointment.  Patient was confused and had abnormal gait.  Per previous neurology notes, patient has a history of abnormal gait, but was oriented x 3.  EMT stated that he was also slurring and drooling.  No mention of this in previous note, spoke with Dr. Larose Kells and notified EMT to take patient to ED.

## 2014-08-15 MED ORDER — DOXYCYCLINE HYCLATE 100 MG PO CAPS: 100.0000 mg | ORAL_CAPSULE | Freq: Two times a day (BID) | ORAL | Status: DC

## 2014-08-17 ENCOUNTER — Ambulatory Visit: Payer: Medicare Other | Admitting: Rehabilitation

## 2014-08-17 DIAGNOSIS — R269 Unspecified abnormalities of gait and mobility: Secondary | ICD-10-CM

## 2014-08-17 DIAGNOSIS — Z7409 Other reduced mobility: Secondary | ICD-10-CM

## 2014-08-17 DIAGNOSIS — R29898 Other symptoms and signs involving the musculoskeletal system: Secondary | ICD-10-CM
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
## 2014-08-20 ENCOUNTER — Ambulatory Visit: Payer: Medicare Other | Admitting: Rehabilitation

## 2014-08-20 DIAGNOSIS — R269 Unspecified abnormalities of gait and mobility: Secondary | ICD-10-CM

## 2014-08-20 DIAGNOSIS — R29898 Other symptoms and signs involving the musculoskeletal system: Secondary | ICD-10-CM

## 2014-08-20 DIAGNOSIS — Z7409 Other reduced mobility: Secondary | ICD-10-CM

## 2014-08-20 LAB — CULTURE, BLOOD (ROUTINE X 2)
Culture: NO GROWTH
Culture: NO GROWTH

## 2014-08-20 NOTE — Therapy (Signed)
[facility] [address]  [place], [name], [address] Phone: [phone]   Fax:  [phone]  Physical Therapy Treatment  Patient Details  Name: Jeffrey Acosta MRN: [id number] Date of Birth: 1950/05/02 Referring Provider:  Colon Branch, MD  Encounter Date: 08/20/2014      PT End of Session - 08/20/14 1614    Visit Number 3   Number of Visits 12   Date for PT Re-Evaluation 09/20/14   PT Start Time 8466   PT Stop Time 1655   PT Time Calculation (min) 40 min      Past Medical History  Diagnosis Date  . 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 Hx of migraines   . Hydrocele   . Diverticulosis of colon (without mention of hemorrhage)   . GERD (gastroesophageal reflux disease)   . Hiatal hernia   . Bipolar affective disorder   . Osteopenia   . Hypothyroidism   . HYPERLIPIDEMIA 01/10/2009    Qualifier: Diagnosis of  By: Ronnald Ramp CMA, Chemira    NMR Lipoprofile 2011 LDL could not be calculated  Due to TG of 889  (811  / 750 ),  HDL 50 . Father MI @ 11 PGM CVA early 50s; PGF CVA @72    . Melanoma of skin, site unspecified 11/25/2012    07/2011 abdominal  Stage 1 Dr Bo Lacy [place] Dr Sarajane Jews     Past Surgical History  Procedure Laterality Date  . Colonoscopy  2003    negative ; Dr Sharlett Iles  . Upper gastrointestinal endoscopy  1983    hiatal hernia  . 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 Vasectomy  1979  . Tonsillectomy  1951  . Melanoma excision  07/28/11    Dr Sarajane Jews; stomach and chest  . Inguinal hernia repair  08/07/2011    Procedure: HERNIA REPAIR INGUINAL ADULT;  Surgeon: Rolm Bookbinder, MD;  Location: [facility];  Service: General;  Laterality: Right;    There were no vitals filed for this visit.  Visit Diagnosis:  Impaired functional mobility, balance, gait, and endurance  Abnormality of gait  Ankle weakness      Subjective Assessment - 08/20/14 1617    Subjective Says he  felt good after last time. Notes no pain but just some ankle weakness/soreness today.          TODAY'S TREATMENT: Neuro: SLS on blue foam 3x8-10" with 1-0 HHA Heel/toe raises on blue foam 15x 2 HHA on chair Alt Hip Abd on blue foam 10x 2 HHA on chair then 10x 1 HHA on chair Toe taps to 8" step from blue foam with 1 HHA on chair 10x then 10x without HHA (no LOB but stubbed toe three times when not using HHA) Sidestepping on balance beam with 2 chairs in front x5 each way Tandem walking on balance beam with 2 chairs in front x3 FWD/BWD  TherEx:  Nustep level 3x4' (UE/LE) Seated Bil Horiz ABD Green TB 12x Seated Rows Green TB 12x (verbal and tactile cues needed) Doorway stretch 3x20" (verbal cues for proper mechanics) Squats with chair in front and plinth behind for cues 10x (only required verbal cues for mechanics today)         PT Short Term Goals - 08/17/14 0944    PT SHORT TERM GOAL #1   Title pt independent with initial HEP by 08/24/14   Status On-going  PT Long Term Goals - 08/17/14 0944    PT LONG TERM GOAL #1   Title pt scores 49/56 or better on Berg by 09/20/14   Status On-going   PT LONG TERM GOAL #2   Title pt displays more normal gait mechanics to include upright posture, proper foot clearance, and improved stride length and cadence by 09/20/14   Status On-going   PT LONG TERM GOAL #3   Title pt independent with appropriate walking AD if necessary by 09/20/14   Status On-going   PT LONG TERM GOAL #4   Title pt able to perform all transfers and bed mobility tasks independently and safely by 09/20/14   Status On-going               Plan - 08/20/14 1633    Clinical Impression Statement Continues to require verbal cues for proper posture during all exercises and to not stare at feet during balance activities. Improved mechanics noted with seated posture exercise, sent pt home with this today.   PT Next Visit Plan Balance, gait and postural  training, SPC use.    Consulted and Agree with Plan of Care Patient        Problem List Patient Active Problem List   Diagnosis Date Noted  . Tremor 07/29/2014  . Gait disorder 07/29/2014  . Alcohol withdrawal 03/18/2014  . Gout 01/08/2014  . Unspecified vitamin D deficiency 11/25/2012  . Melanoma of skin, site unspecified 11/25/2012  . 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 Alcoholism in remission 06/19/2011  . Diverticulitis of colon (without mention of hemorrhage) 06/19/2011  . 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 OSTEOPENIA 05/17/2010  . DIVERTICULOSIS, COLON 05/16/2010  . HYPERTRIGLYCERIDEMIA 08/02/2009  . HYPERLIPIDEMIA 01/10/2009  . ABNORMAL ELECTROCARDIOGRAM 11/26/2008  . REFLUX, ESOPHAGEAL 11/17/2006  . COMMON MIGRAINE 09/22/2006    Barbette Hair, PTA 08/20/2014, 4:54 PM  [facility] [address]  [place], [name], [address] Phone: [phone]   Fax:  [phone]

## 2014-08-21 ENCOUNTER — Ambulatory Visit: Payer: Medicare Other | Admitting: Physical Therapy

## 2014-08-23 ENCOUNTER — Other Ambulatory Visit: Payer: Self-pay

## 2014-08-24 ENCOUNTER — Ambulatory Visit: Payer: Medicare Other | Admitting: Physical Therapy

## 2014-08-28 ENCOUNTER — Ambulatory Visit: Payer: Medicare Other | Attending: Internal Medicine | Admitting: Physical Therapy

## 2014-08-28 ENCOUNTER — Ambulatory Visit (INDEPENDENT_AMBULATORY_CARE_PROVIDER_SITE_OTHER): Payer: Medicare Other | Admitting: Internal Medicine

## 2014-08-28 ENCOUNTER — Encounter: Payer: Self-pay | Admitting: Internal Medicine

## 2014-08-28 VITALS — BP 118/62 | HR 52 | Temp 97.9°F | Ht 73.0 in | Wt 194.1 lb

## 2014-08-28 DIAGNOSIS — M10071 Idiopathic gout, right ankle and foot: Secondary | ICD-10-CM

## 2014-08-28 DIAGNOSIS — R269 Unspecified abnormalities of gait and mobility: Secondary | ICD-10-CM | POA: Diagnosis not present

## 2014-08-28 DIAGNOSIS — E039 Hypothyroidism, unspecified: Secondary | ICD-10-CM | POA: Insufficient documentation

## 2014-08-28 DIAGNOSIS — N289 Disorder of kidney and ureter, unspecified: Secondary | ICD-10-CM

## 2014-08-28 DIAGNOSIS — M25571 Pain in right ankle and joints of right foot: Secondary | ICD-10-CM

## 2014-08-28 DIAGNOSIS — M259 Joint disorder, unspecified: Secondary | ICD-10-CM | POA: Diagnosis not present

## 2014-08-28 DIAGNOSIS — Z7409 Other reduced mobility: Secondary | ICD-10-CM | POA: Insufficient documentation

## 2014-08-28 DIAGNOSIS — F10230 Alcohol dependence with withdrawal, uncomplicated: Secondary | ICD-10-CM | POA: Diagnosis not present

## 2014-08-28 DIAGNOSIS — F10930 Alcohol use, unspecified with withdrawal, uncomplicated: Secondary | ICD-10-CM

## 2014-08-28 DIAGNOSIS — R262 Difficulty in walking, not elsewhere classified: Secondary | ICD-10-CM

## 2014-08-28 DIAGNOSIS — R29898 Other symptoms and signs involving the musculoskeletal system: Secondary | ICD-10-CM

## 2014-08-28 LAB — BASIC METABOLIC PANEL
BUN: 11 mg/dL (ref 6–23)
CO2: 30 mEq/L (ref 19–32)
Calcium: 9.2 mg/dL (ref 8.4–10.5)
Chloride: 108 mEq/L (ref 96–112)
Creatinine, Ser: 1.04 mg/dL (ref 0.40–1.50)
GFR: 75.96 mL/min (ref >60.00)
Glucose, Bld: 97 mg/dL (ref 70–99)
Potassium: 4.6 mEq/L (ref 3.5–5.1)
Sodium: 142 mEq/L (ref 135–145)

## 2014-08-28 LAB — TSH: TSH: 0.19 u[IU]/mL — ABNORMAL LOW (ref 0.35–4.50)

## 2014-08-28 NOTE — Patient Instructions (Signed)
Get your blood work before you leave    Avoid ibuprofen, Aleve, naproxen or similar medications For pain at the ankle: Use ice, Tylenol 500 mg one or 2 tablets 4 times a day   Come back to the office in 3 months   for a routine check up

## 2014-08-28 NOTE — Progress Notes (Signed)
Subjective:    Patient ID: Jeffrey Acosta, male    DOB: 02/27/1951, 64 y.o.   MRN: [id number]  DOS:  08/28/2014 Type of visit - description : f/u Interval history: Was recently seen with gait disorder, abnormal speech, tremor. Labs show a high TSH, increased creatinine, and elevated lithium level. Subsequently he saw neurology, it was felt that most of his symptoms were due to elevated lithium which was discontinued. Eventually went to the ER with same complains and  feeling disoriented. Chest x-ray was negative, white count was slightly elevated, blood cultures were negative, CT head showed no acute changes, urinalysis negative, was treated empirically with antibiotics for possible pneumonia. Here for follow-up.   Review of Systems  Overall feels much improved, gait is almost back to normal, motor is stable and without major problems with anxiety or depression. Denies drinking or using drugs in the last "80 days". No chest pain or difficulty breathing No nausea, vomiting, diarrhea He has a year-long history of right ankle pain on and off.   Past Medical History  Diagnosis Date  . 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 Hx of migraines   . Hydrocele   . Diverticulosis of colon (without mention of hemorrhage)   . GERD (gastroesophageal reflux disease)   . Hiatal hernia   . Bipolar affective disorder   . Osteopenia   . Hypothyroidism   . HYPERLIPIDEMIA 01/10/2009    Qualifier: Diagnosis of  By: Ronnald Ramp CMA, Chemira    NMR Lipoprofile 2011 LDL could not be calculated  Due to TG of 889  (811  / 750 ),  HDL 50 . Father MI @ 43 PGM CVA early 80s; PGF CVA @72    . Melanoma of skin, site unspecified 11/25/2012    07/2011 abdominal  Stage 1 Dr Allard Lacy [place] Dr Sarajane Jews     Past Surgical History  Procedure Laterality Date  . Colonoscopy  2003    negative ; Dr Sharlett Iles  . Upper gastrointestinal endoscopy  1983    hiatal hernia  . 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 Vasectomy   1979  . Tonsillectomy  1951  . Melanoma excision  07/28/11    Dr Sarajane Jews; stomach and chest  . Inguinal hernia repair  08/07/2011    Procedure: HERNIA REPAIR INGUINAL ADULT;  Surgeon: Rolm Bookbinder, MD;  Location: [facility];  Service: General;  Laterality: Right;    History   Social History  . Marital Status: Married    Spouse Name: N/A  . Number of Children: N/A  . Years of Education: N/A   Occupational History  . Not on file.   Social History Main Topics  . Smoking status: Never Smoker   . Smokeless tobacco: Never Used  . Alcohol Use: No     Comment: former alcoholic; now in remission  . Drug Use: No  . Sexual Activity: Not on file   Other Topics Concern  . Not on file   Social History Narrative        Medication List       This list is accurate as of: 08/28/14  8:57 AM.  Always use your most recent med list.               allopurinol 100 MG tablet  Commonly known as:  ZYLOPRIM  Take 200 mg by mouth daily.     colchicine 0.6 MG tablet  TAKE 2 TABLETS BY MOUTH  AT FIRST SIGNS OF GOUT, THEN 1 TABLET IN 1 HOUR IF NO IMPROVEMENT. CONTINUE TO TAKE 1 TABLET DAILY UNTIL RESOLVED.     lamoTRIgine 100 MG tablet  Commonly known as:  LAMICTAL  Take 100 mg by mouth daily.     levothyroxine 75 MCG tablet  Commonly known as:  SYNTHROID, LEVOTHROID  Take 1 tablet (75 mcg total) by mouth daily.     mesalamine 0.375 G 24 hr capsule  Commonly known as:  APRISO  Take 1.5 g by mouth daily as needed (for IBS).     naltrexone 50 MG tablet  Commonly known as:  DEPADE  Take 50 mg by mouth daily.     propranolol 20 MG tablet  Commonly known as:  INDERAL  Take 20 mg by mouth 4 (four) times daily.     QUEtiapine 100 MG tablet  Commonly known as:  SEROQUEL  Take 400 mg by mouth at bedtime.           Objective:   Physical Exam BP 118/62 mmHg  Pulse 52  Temp(Src) 97.9 F (36.6 C) (Oral)  Ht 6\' 1"  (1.854 m)  Wt 194 lb 2 oz (88.055 kg)  BMI  25.62 kg/m2  SpO2 97% General:   Well developed, well nourished . NAD.  HEENT:  Normocephalic . Face symmetric, atraumatic Lungs:  CTA B Normal respiratory effort, no intercostal retractions, no accessory muscle use. Heart: RRR,  no murmur.  Muscle skeletal: ankles symmetric, today there is no redness or TTP on either ankle. Trace pretibial edema bilaterally, Skin: Not pale. Not jaundice Neurologic:  alert & oriented X3.  Speech normal, gait appropriate for age and unassisted Psych--  Cognition and judgment appear intact.  Cooperative with normal attention span and concentration.  Behavior appropriate. No anxious or depressed appearing.        Assessment & Plan:     Alcohol abuse, reports he has been sober  Tremor, much improved after lithium was discontinued  Gait disorder, improved after lithium discontinue  Bipolar, off lithium, to see psychiatry 09/12/2014

## 2014-08-28 NOTE — Assessment & Plan Note (Signed)
Reports on and off ankle pain, x 1 year related to gout? Recommend conservative treatment and avoid NSAIDs. Continue with allopurinol.

## 2014-08-28 NOTE — Progress Notes (Signed)
Pre visit review using our clinic review tool, if applicable. No additional management support is needed unless otherwise documented below in the visit note. 

## 2014-08-28 NOTE — Therapy (Signed)
[facility] [address]  [place], [name], [address] Phone: [phone]   Fax:  [phone]  Physical Therapy Treatment  Patient Details  Name: Jeffrey Acosta MRN: [id number] Date of Birth: [birth year] Referring Provider:  Colon Branch, MD  Encounter Date: 08/28/2014      PT End of Session - 08/28/14 0727    Visit Number 4   Number of Visits 12   Date for PT Re-Evaluation 09/20/14   PT Start Time 0716   PT Stop Time 0802   PT Time Calculation (min) 46 min      Past Medical History  Diagnosis Date  . 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 Hx of migraines   . Hydrocele   . Diverticulosis of colon (without mention of hemorrhage)   . GERD (gastroesophageal reflux disease)   . Hiatal hernia   . Bipolar affective disorder   . Osteopenia   . Hypothyroidism   . HYPERLIPIDEMIA 01/10/2009    Qualifier: Diagnosis of  By: Ronnald Ramp CMA, Chemira    NMR Lipoprofile 2011 LDL could not be calculated  Due to TG of 889  (811  / 750 ),  HDL 50 . Father MI @ 21 PGM CVA early 50s; PGF CVA @72    . Melanoma of skin, site unspecified 11/25/2012    07/2011 abdominal  Stage 1 Dr Dilger Lacy [place] Dr Sarajane Jews     Past Surgical History  Procedure Laterality Date  . Colonoscopy  2003    negative ; Dr Sharlett Iles  . Upper gastrointestinal endoscopy  1983    hiatal hernia  . 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 Vasectomy  1979  . Tonsillectomy  1951  . Melanoma excision  07/28/11    Dr Sarajane Jews; stomach and chest  . Inguinal hernia repair  08/07/2011    Procedure: HERNIA REPAIR INGUINAL ADULT;  Surgeon: Rolm Bookbinder, MD;  Location: [facility];  Service: General;  Laterality: Right;    There were no vitals filed for this visit.  Visit Diagnosis:  Ankle weakness  Abnormality of gait  Difficulty walking  Right ankle pain      Subjective Assessment - 08/28/14 0721    Subjective Pt states symptoms he was  experiencing were found to be due to lithium he was taking and he has since stopped taking and is feeling much better.  He states his balance, mobility, and handwriting have all improved and UE tremors have decreased.  His cc at this time is R ankle pain.   Patient Stated Goals less pain with walking   Currently in Pain? Yes   Pain Score 8    Pain Location Ankle   Pain Orientation Right;Anterior;Lateral   Pain Descriptors / Indicators --  "deep arthritic ache"   Aggravating Factors  wt bearing, walking, stairs, car transfers, sit->stand   Pain Relieving Factors rest   Multiple Pain Sites No            [facility] PT Assessment - 08/28/14 0001    ROM / Strength   AROM / PROM / Strength Strength   Strength   Strength Assessment Site Ankle   Right/Left Ankle Right   Right Ankle Dorsiflexion 4+/5   Right Ankle Plantar Flexion 3/5  pain   Right Ankle Inversion 4-/5  pain   Right Ankle Eversion 4+/5   Palpation   Palpation TTP with deep pitting edema R lateral ankle.  Pitting edema extends along lateral R and L lower legs but is not noted in L ankle.   Special Tests    Special Tests Ankle/Foot Special Tests   Ankle/Foot Special Tests  Anterior Drawer Test   Anterior Drawer Test   Findings Positive   Side  Right   Berg Balance Test   Sit to Stand Able to stand without using hands and stabilize independently   Standing Unsupported Able to stand safely 2 minutes   Sitting with Back Unsupported but Feet Supported on Floor or Stool Able to sit safely and securely 2 minutes   Stand to Sit Sits safely with minimal use of hands   Transfers Able to transfer safely, minor use of hands   Standing Unsupported with Eyes Closed Able to stand 10 seconds safely   Standing Ubsupported with Feet Together Able to place feet together independently and stand 1 minute safely   From Standing, Reach Forward with Outstretched Arm Can reach confidently >25 cm (10")   From Standing Position, Pick up Object  from Floor Able to pick up shoe safely and easily   From Standing Position, Turn to Look Behind Over each Shoulder Looks behind from both sides and weight shifts well   Turn 360 Degrees Able to turn 360 degrees safely in 4 seconds or less   Standing Unsupported, Alternately Place Feet on Step/Stool Able to stand independently and safely and complete 8 steps in 20 seconds   Standing Unsupported, One Foot in Front Able to plae foot ahead of the other independently and hold 30 seconds   Standing on One Leg Able to lift leg independently and hold > 10 seconds  > 10" on L, 4" on R but this is due to ankle pain   Total Score 55      TODAY'S TREATMENT: RE-EVAL due to significant change in symptoms and for R Ankle Assessment Manual - R Ankle distraction and Grade 2-3 AP and PA mobes to assist with pain reduction and improved fluid management. Kinesiotape to R Ankle 3 strips                         PT Short Term Goals - 08/28/14 0807    PT SHORT TERM GOAL #1   Title pt independent with initial HEP by 08/24/14   Status On-going           PT Long Term Goals - 08/28/14 0807    PT LONG TERM GOAL #1   Title pt scores 49/56 or better on Berg by 09/20/14   Status Achieved   PT LONG TERM GOAL #2   Title pt displays more normal gait mechanics to include upright posture, proper foot clearance, and improved stride length and cadence and not limited by R Ankle pain by 09/20/14  antalgic and forward flexed due to R ankle pain   Status Revised   PT LONG TERM GOAL #3   Title pt independent with appropriate walking AD if necessary by 09/20/14  no longer required   Status Achieved   PT LONG TERM GOAL #4   Title pt able to perform all transfers and bed mobility tasks independently and safely by 09/20/14  intermittent difficulty due to ankle pain   Status On-going               Plan - 08/28/14 0805    Clinical Impression Statement Mr. Girvan's balance has improved to near  normal since he has discontinued lithium.  Chief issue at this point is intense R anteriolateral ankle pain.  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   Pt will benefit from skilled therapeutic intervention in order to improve on the following deficits Abnormal gait;Difficulty walking;Improper body mechanics;Postural dysfunction;Decreased activity tolerance;Pain;Decreased strength   Rehab Potential Good   PT Treatment/Interventions Balance training;Gait training;Therapeutic exercise;Therapeutic activities;ADLs/Self Care Home Management;Patient/family education;Neuromuscular re-education;Stair training;Manual techniques;Electrical Stimulation   PT Next Visit Plan R ankle joint mobes, R ankle strengthening to tolerance, dynamic gait training as able   Consulted and Agree with Plan of Care Patient        Problem List Patient Active Problem List   Diagnosis Date Noted  . Tremor 07/29/2014  . Gait disorder 07/29/2014  . Alcohol withdrawal 03/18/2014  . Gout 01/08/2014  . Unspecified vitamin D deficiency 11/25/2012  . Melanoma of skin, site unspecified 11/25/2012  . 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 Alcoholism in remission 06/19/2011  . Diverticulitis of colon (without mention of hemorrhage) 06/19/2011  . 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 OSTEOPENIA 05/17/2010  . DIVERTICULOSIS, COLON 05/16/2010  . HYPERTRIGLYCERIDEMIA 08/02/2009  . HYPERLIPIDEMIA 01/10/2009  . ABNORMAL ELECTROCARDIOGRAM 11/26/2008  . REFLUX, ESOPHAGEAL 11/17/2006  . COMMON MIGRAINE 09/22/2006    Varsha Knock PT, OCS 08/28/2014, 10:16 AM  [facility] [place], [name], [address] Phone: [phone]   Fax:  [phone]

## 2014-08-28 NOTE — Assessment & Plan Note (Signed)
hypothyroidism, recent TSH elevated, Synthroid dose adjusted, check a TSH

## 2014-08-28 NOTE — Assessment & Plan Note (Signed)
Renal insufficiency, creatinine has been elevated on and off, he reports uses ibuprofen  lately due to ankle pain. We agreed to d/c  NSAIDs, take Tylenol for pain, check a BMP

## 2014-08-30 MED ORDER — LEVOTHYROXINE SODIUM 50 MCG PO TABS: 50.0000 ug | ORAL_TABLET | Freq: Every day | ORAL | Status: DC

## 2014-08-30 NOTE — Addendum Note (Signed)
Addended by: Wilfrid Lund on: 08/30/2014 08:38 AM   Modules accepted: Orders, Medications

## 2014-08-31 ENCOUNTER — Ambulatory Visit: Payer: Medicare Other | Admitting: Physical Therapy

## 2014-08-31 DIAGNOSIS — M25571 Pain in right ankle and joints of right foot: Secondary | ICD-10-CM

## 2014-08-31 DIAGNOSIS — R269 Unspecified abnormalities of gait and mobility: Secondary | ICD-10-CM | POA: Diagnosis not present

## 2014-08-31 NOTE — Therapy (Signed)
[facility] [address]  [place], [name], [address] Phone: [phone]   Fax:  [phone]  Physical Therapy Treatment  Patient Details  Name: Jeffrey Acosta MRN: [id number] Date of Birth: 1950-09-02 Referring Provider:  Colon Branch, MD  Encounter Date: 08/31/2014      PT End of Session - 08/31/14 0725    Visit Number 5   Number of Visits 12   Date for PT Re-Evaluation 09/20/14   PT Start Time 0715   PT Stop Time 0803   PT Time Calculation (min) 48 min      Past Medical History  Diagnosis Date  . 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 Hx of migraines   . Hydrocele   . Diverticulosis of colon (without mention of hemorrhage)   . GERD (gastroesophageal reflux disease)   . Hiatal hernia   . Bipolar affective disorder   . Osteopenia   . Hypothyroidism   . HYPERLIPIDEMIA 01/10/2009    Qualifier: Diagnosis of  By: Ronnald Ramp CMA, Chemira    NMR Lipoprofile 2011 LDL could not be calculated  Due to TG of 889  (811  / 750 ),  HDL 50 . Father MI @ 69 PGM CVA early 35s; PGF CVA @72    . Melanoma of skin, site unspecified 11/25/2012    07/2011 abdominal  Stage 1 Dr Amy [place] Dr Sarajane Jews     Past Surgical History  Procedure Laterality Date  . Colonoscopy  2003    negative ; Dr Sharlett Iles  . Upper gastrointestinal endoscopy  1983    hiatal hernia  . 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 Vasectomy  1979  . Tonsillectomy  1951  . Melanoma excision  07/28/11    Dr Sarajane Jews; stomach and chest  . Inguinal hernia repair  08/07/2011    Procedure: HERNIA REPAIR INGUINAL ADULT;  Surgeon: Rolm Bookbinder, MD;  Location: [facility];  Service: General;  Laterality: Right;    There were no vitals filed for this visit.  Visit Diagnosis:  Right ankle pain  Abnormality of gait      Subjective Assessment - 08/31/14 0721    Subjective states noted maybe 1-2 days of reduced pain following last session but  states pain is back to 8/10 today.  Pt ambulating with antalgic gait and c/o most ankle pain with walking (2/10 at rest, 8/10 with walking).   Currently in Pain? Yes   Pain Score 8    Pain Location Ankle   Pain Orientation Right;Anterior;Lateral       TODAY'S TREATMENT: Manual - Grade 3-4 R Ankle distraction, Grade 2-3 AP and PA mobes to assist with pain reduction and improved fluid management. [place] - 20%, 2cm, 0.5cm/W2, 8', 1MHz to R anteriolateral ankle joint line Kinesiotape to R Ankle 3 strips Vasopnuematic compression R Ankle, 15', Low Pressure, 40dg        PT Short Term Goals - 08/28/14 0807    PT SHORT TERM GOAL #1   Title pt independent with initial HEP by 08/24/14   Status On-going           PT Long Term Goals - 08/28/14 0807    PT LONG TERM GOAL #1   Title pt scores 49/56 or better on Berg by 09/20/14   Status Achieved   PT LONG TERM GOAL #2   Title pt displays more normal gait mechanics to include upright  posture, proper foot clearance, and improved stride length and cadence and not limited by R Ankle pain by 09/20/14  antalgic and forward flexed due to R ankle pain   Status Revised   PT LONG TERM GOAL #3   Title pt independent with appropriate walking AD if necessary by 09/20/14  no longer required   Status Achieved   PT LONG TERM GOAL #4   Title pt able to perform all transfers and bed mobility tasks independently and safely by 09/20/14  intermittent difficulty due to ankle pain   Status On-going               Plan - 08/31/14 0758    Clinical Impression Statement pt continues to c/o intense R ankle pain with wt bearing.  Pain seems OA with impingement vs gout.  Will treat for 2-4 more treatments but if no benefit will discontinue PT for ankle pain.   PT Next Visit Plan R ankle joint mobes, R ankle strengthening to tolerance, dynamic gait training as able, modalities for pain   Consulted and Agree with Plan of Care Patient        Problem  List Patient Active Problem List   Diagnosis Date Noted  . Hypothyroidism 08/28/2014  . Tremor 07/29/2014  . Gait disorder 07/29/2014  . Alcohol withdrawal 03/18/2014  . Gout 01/08/2014  . Renal insufficiency 11/25/2012  . Unspecified vitamin D deficiency 11/25/2012  . Melanoma of skin, site unspecified 11/25/2012  . 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 Alcoholism in remission 06/19/2011  . Diverticulitis of colon (without mention of hemorrhage) 06/19/2011  . 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 OSTEOPENIA 05/17/2010  . DIVERTICULOSIS, COLON 05/16/2010  . HYPERTRIGLYCERIDEMIA 08/02/2009  . HYPERLIPIDEMIA 01/10/2009  . ABNORMAL ELECTROCARDIOGRAM 11/26/2008  . REFLUX, ESOPHAGEAL 11/17/2006  . COMMON MIGRAINE 09/22/2006    Montario Zilka PT, OCS 08/31/2014, 8:07 AM  [facility] [place], [name], [address] Phone: [phone]   Fax:  [phone]

## 2014-09-04 ENCOUNTER — Ambulatory Visit: Payer: Medicare Other | Admitting: Physical Therapy

## 2014-09-04 ENCOUNTER — Other Ambulatory Visit: Payer: Self-pay

## 2014-09-04 DIAGNOSIS — R262 Difficulty in walking, not elsewhere classified: Secondary | ICD-10-CM

## 2014-09-04 DIAGNOSIS — R269 Unspecified abnormalities of gait and mobility: Secondary | ICD-10-CM | POA: Diagnosis not present

## 2014-09-04 DIAGNOSIS — R29898 Other symptoms and signs involving the musculoskeletal system: Secondary | ICD-10-CM

## 2014-09-04 DIAGNOSIS — M25571 Pain in right ankle and joints of right foot: Secondary | ICD-10-CM

## 2014-09-04 NOTE — Therapy (Addendum)
[facility] [address]  [place], [name], [address] Phone: [phone]   Fax:  [phone]  Physical Therapy Treatment  Patient Details  Name: Jeffrey Acosta MRN: [id number] Date of Birth: 02-08-1951 Referring Provider:  Colon Branch, MD  Encounter Date: 09/04/2014      PT End of Session - 09/04/14 0723    Visit Number 6   Number of Visits 12   Date for PT Re-Evaluation 09/20/14   PT Start Time 0719   PT Stop Time 0756   PT Time Calculation (min) 37 min      Past Medical History  Diagnosis Date  . 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 Hx of migraines   . Hydrocele   . Diverticulosis of colon (without mention of hemorrhage)   . GERD (gastroesophageal reflux disease)   . Hiatal hernia   . Bipolar affective disorder   . Osteopenia   . Hypothyroidism   . HYPERLIPIDEMIA 01/10/2009    Qualifier: Diagnosis of  By: Ronnald Ramp CMA, Chemira    NMR Lipoprofile 2011 LDL could not be calculated  Due to TG of 889  (811  / 750 ),  HDL 50 . Father MI @ 60 PGM CVA early 58s; PGF CVA _0    . Melanoma of skin, site unspecified 11/25/2012    07/2011 abdominal  Stage 1 Dr Amy [place] Dr Sarajane Jews     Past Surgical History  Procedure Laterality Date  . Colonoscopy  2003    negative ; Dr Sharlett Iles  . Upper gastrointestinal endoscopy  1983    hiatal hernia  . 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 Vasectomy  1979  . Tonsillectomy  1951  . Melanoma excision  07/28/11    Dr Sarajane Jews; stomach and chest  . Inguinal hernia repair  08/07/2011    Procedure: HERNIA REPAIR INGUINAL ADULT;  Surgeon: Rolm Bookbinder, MD;  Location: [facility];  Service: General;  Laterality: Right;    There were no vitals filed for this visit.  Visit Diagnosis:  Right ankle pain  Difficulty walking  Ankle weakness      Subjective Assessment - 09/04/14 0721    Subjective states feels as though balance and posture have  improved but he only notes mild change with regard to R foot/ankle pain.  He states ankle "killing me".   Currently in Pain? Yes   Pain Score 7    Pain Location Ankle   Pain Orientation Right;Anterior;Lateral   Multiple Pain Sites No         TODAY'S TREATMENT: Manual - Grade 3-4 R Ankle distraction, Grade 2-3 AP and PA mobes R mid and forefoot grade 2 mobes pronation/supinationto all with goal to assist with pain reduction and improved fluid management.  Marland Kitchen [place] - 20%, 2cm, 0.5cm/W2, 8', 1MHz to R anteriolateral ankle joint line            PT Short Term Goals - 08/28/14 0807    PT SHORT TERM GOAL #1   Title pt independent with initial HEP by 08/24/14   Status On-going           PT Long Term Goals - 09/04/14 0804    PT LONG TERM GOAL #1   Title pt scores 49/56 or better on Berg by 09/20/14   Status Achieved   PT LONG TERM GOAL #2   Title pt displays more normal gait mechanics to  include upright posture, proper foot clearance, and improved stride length and cadence and not limited by R Ankle pain by 09/20/14   Status On-going   PT LONG TERM GOAL #3   Title pt independent with appropriate walking AD if necessary by 09/20/14   Status Achieved   PT LONG TERM GOAL #4   Title pt able to perform all transfers and bed mobility tasks independently and safely by 09/20/14  is safe but has pain in ankle with sit->stand   Status Achieved     Late Entry G-Code: Functional Assessment Tool Used FOTO/clinical judgement  Functional Limitation Mobility: Walking and Moving Around     Goal Status CJ  Discharge Status CK      Laureen Abrahams, PT, DPT 11/08/2014 3:21 PM  [facility] [place], [place] [address]  [phone] (office) [phone] (fax)         Plan - 09/04/14 [phone]    Clinical Impression Statement Since stopping lithium, [name] gait and balance have basically returned to normal;  however, he has not made any significant progress with regard to his c/o R ankle pain.  Pain is noted with wt bearing and is worst upon waking in AM.  He notes pain with ankle DF PROM and is TTP throughout anterior talocrural joint line and this tenderness extends from anterior ankle along #2-4 metatarsals to MTPs of these three toes.  No edema palpated within foot, slight palpable edema in talacrural joint.  Given lack of progress and pt's c/o intense pain, I have advised him to contact his referring provider to initiate additional imaging and/or referral to ortho.   PT Next Visit Plan R ankle joint mobes, R ankle strengthening to tolerance, dynamic gait training as able, modalities for pain   Consulted and Agree with Plan of Care Patient        Problem List Patient Active Problem List   Diagnosis Date Noted  . Hypothyroidism 08/28/2014  . Tremor 07/29/2014  . Gait disorder 07/29/2014  . Alcohol withdrawal 03/18/2014  . Gout 01/08/2014  . Renal insufficiency 11/25/2012  . Unspecified vitamin D deficiency 11/25/2012  . Melanoma of skin, site unspecified 11/25/2012  . 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 Alcoholism in remission 06/19/2011  . Diverticulitis of colon (without mention of hemorrhage) 06/19/2011  . 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 OSTEOPENIA 05/17/2010  . DIVERTICULOSIS, COLON 05/16/2010  . HYPERTRIGLYCERIDEMIA 08/02/2009  . HYPERLIPIDEMIA 01/10/2009  . ABNORMAL ELECTROCARDIOGRAM 11/26/2008  . REFLUX, ESOPHAGEAL 11/17/2006  . COMMON MIGRAINE 09/22/2006    Allena Pietila PT, OCS 09/04/2014, 8:09 AM  [facility] [address]  [place], [name], [address] Phone: [phone]   Fax:  [phone]      PHYSICAL THERAPY DISCHARGE SUMMARY  Visits from Start of Care: 6  Current functional level related to goals / functional outcomes: See above; pt met 3/4 LTGs.  Did not return to PT after last  session   Remaining deficits: Unknown as pt did not return   Education / Equipment: HEP  Plan: Patient agrees to discharge.  Patient goals were partially met. Patient is being discharged due to not returning since the last visit.  ?????    Laureen Abrahams, PT, DPT 11/08/2014 3:22 PM   [facility] at [facility] [place], [place] [address]  [phone] (office) [phone] (fax)

## 2014-09-05 ENCOUNTER — Ambulatory Visit (INDEPENDENT_AMBULATORY_CARE_PROVIDER_SITE_OTHER): Payer: Medicare Other | Admitting: Internal Medicine

## 2014-09-05 ENCOUNTER — Ambulatory Visit (HOSPITAL_BASED_OUTPATIENT_CLINIC_OR_DEPARTMENT_OTHER)
Admission: RE | Admit: 2014-09-05 | Discharge: 2014-09-05 | Disposition: A | Discharge status: Home / Self Care | Payer: Medicare Other | Source: Ambulatory Visit | Attending: Internal Medicine | Admitting: Internal Medicine

## 2014-09-05 ENCOUNTER — Encounter: Payer: Self-pay | Admitting: Internal Medicine

## 2014-09-05 VITALS — BP 128/78 | HR 54 | Temp 97.5°F | Ht 73.0 in | Wt 189.0 lb

## 2014-09-05 DIAGNOSIS — M7731 Calcaneal spur, right foot: Secondary | ICD-10-CM | POA: Diagnosis not present

## 2014-09-05 DIAGNOSIS — I7090 Unspecified atherosclerosis: Secondary | ICD-10-CM | POA: Insufficient documentation

## 2014-09-05 DIAGNOSIS — J9801 Acute bronchospasm: Secondary | ICD-10-CM

## 2014-09-05 DIAGNOSIS — M2011 Hallux valgus (acquired), right foot: Secondary | ICD-10-CM | POA: Insufficient documentation

## 2014-09-05 DIAGNOSIS — M25571 Pain in right ankle and joints of right foot: Secondary | ICD-10-CM | POA: Insufficient documentation

## 2014-09-05 DIAGNOSIS — M79671 Pain in right foot: Secondary | ICD-10-CM | POA: Diagnosis present

## 2014-09-05 DIAGNOSIS — M19071 Primary osteoarthritis, right ankle and foot: Secondary | ICD-10-CM

## 2014-09-05 MED ORDER — CEFUROXIME AXETIL 500 MG PO TABS: 500.0000 mg | ORAL_TABLET | Freq: Two times a day (BID) | ORAL | Status: DC

## 2014-09-05 MED ORDER — ALBUTEROL SULFATE HFA 108 (90 BASE) MCG/ACT IN AERS: 2.0000 | INHALATION_SPRAY | Freq: Four times a day (QID) | RESPIRATORY_TRACT | Status: DC | PRN

## 2014-09-05 NOTE — Progress Notes (Signed)
Subjective:    Patient ID: Jeffrey Acosta, male    DOB: January 28, 1951, 64 y.o.   MRN: [id number]  DOS:  09/05/2014 Type of visit - description : rov Interval history:  Patient is a 64 year old male with a history of gout and osteopenia in today for a follow up appointment concerned about respiratory symptoms and right ankle pain.   Per chart review, patient was seen in ER on 08/14/14 for fever and confusion, chest x-ray was negative, WBCs were elevated, was treated empirically with antibiotic given respiratory symptoms. Patient presents today with persistent congestion, ear pressure, rhinorrhea, productive cough, and wheezing. Patient states symptoms have neither improved nor worsened but that they are preventing him from sleeping (cough). Patient denies any sinus pain, chest pain, fever, or hemoptysis.   Patient also has right ankle pain and stiffness which is most painful when he is walking which has been present for more than a year. Patient has been seeing PT for ankle but therapist rec to see PCP. Also notes it is painful with dorsiflexion of foot. He notes mild swelling at the end of the day. Patient has a history of gout but notes that his ankle feels different than a gouty episode.  Review of Systems  Constitutional: No fever, chills. HEENT: As per HPI Respiratory: As per HPI Cardiovascular: No CP or palpitations. Mild nighttime edema. GI: no nausea, vomiting, diarrhea or abdominal pain.  No blood in the stools. No dysphagia   Musculoskeletal: As per HPI Allergic, immunologic: No environmental allergies or food allergies Neurological: No dizziness, headaches, or diplopia.  Past Medical History  Diagnosis Date  . 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 Hx of migraines   . Hydrocele   . Diverticulosis of colon (without mention of hemorrhage)   . GERD (gastroesophageal reflux disease)   . Hiatal hernia   . Bipolar affective disorder   . Osteopenia   . Hypothyroidism   .  HYPERLIPIDEMIA 01/10/2009    Qualifier: Diagnosis of  By: Ronnald Ramp CMA, Chemira    NMR Lipoprofile 2011 LDL could not be calculated  Due to TG of 889  (811  / 750 ),  HDL 50 . Father MI @ 1 PGM CVA early 90s; PGF CVA @72    . Melanoma of skin, site unspecified 11/25/2012    07/2011 abdominal  Stage 1 Dr Perri Lacy [place] Dr Sarajane Jews     Past Surgical History  Procedure Laterality Date  . Colonoscopy  2003    negative ; Dr Sharlett Iles  . Upper gastrointestinal endoscopy  1983    hiatal hernia  . 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 Vasectomy  1979  . Tonsillectomy  1951  . Melanoma excision  07/28/11    Dr Sarajane Jews; stomach and chest  . Inguinal hernia repair  08/07/2011    Procedure: HERNIA REPAIR INGUINAL ADULT;  Surgeon: Rolm Bookbinder, MD;  Location: [place] [facility];  Service: General;  Laterality: Right;    History   Social History  . Marital Status: Married    Spouse Name: N/A  . Number of Children: N/A  . Years of Education: N/A   Occupational History  . Not on file.   Social History Main Topics  . Smoking status: Never Smoker   . Smokeless tobacco: Never Used  . Alcohol Use: No     Comment: former alcoholic; now in remission  . Drug Use: No  . Sexual Activity: Not on  file   Other Topics Concern  . Not on file   Social History Narrative     Family History  Problem Relation Age of Onset  . COPD Father   . Heart attack Father 74  . COPD Mother   . Colon cancer Neg Hx   . Diabetes Paternal Grandmother   . Parkinsonism Paternal Grandmother   . Stroke Paternal Grandmother 61  . Stroke Maternal Grandfather 72       Medication List       This list is accurate as of: 09/05/14 11:51 AM.  Always use your most recent med list.               allopurinol 100 MG tablet  Commonly known as:  ZYLOPRIM  Take 200 mg by mouth daily.     colchicine 0.6 MG tablet  TAKE 2 TABLETS BY MOUTH AT FIRST SIGNS OF GOUT, THEN 1 TABLET IN 1 HOUR IF NO  IMPROVEMENT. CONTINUE TO TAKE 1 TABLET DAILY UNTIL RESOLVED.     lamoTRIgine 100 MG tablet  Commonly known as:  LAMICTAL  Take 100 mg by mouth daily.     levothyroxine 50 MCG tablet  Commonly known as:  SYNTHROID, LEVOTHROID  Take 1 tablet (50 mcg total) by mouth daily.     mesalamine 0.375 G 24 hr capsule  Commonly known as:  APRISO  Take 1.5 g by mouth daily as needed (for IBS).     naltrexone 50 MG tablet  Commonly known as:  DEPADE  Take 50 mg by mouth daily.     propranolol 20 MG tablet  Commonly known as:  INDERAL  Take 20 mg by mouth 4 (four) times daily.     QUEtiapine 100 MG tablet  Commonly known as:  SEROQUEL  Take 400 mg by mouth at bedtime.           Objective:   Physical Exam BP 128/78 mmHg  Pulse 54  Temp(Src) 97.5 F (36.4 C) (Oral)  Ht 6\' 1"  (1.854 m)  Wt 189 lb (85.73 kg)  BMI 24.94 kg/m2  SpO2 98%  General:   Well developed, well nourished . NAD.  HEENT:  Normocephalic . Face symmetric, atraumatic. TMs clear, intact, nonbulging. Throat non-erythematous. Lungs:  Mild end-expiratory wheezing Normal respiratory effort, no intercostal retractions, no accessory muscle use. Heart: RRR,  no murmur.  No pretibial edema bilaterally  Skin: Not pale. Not jaundice Musculoskeletal Trace  lower extremity edema. No erythema. 1+ pedal pulses bilaterally Mildly swollen second and third toes on right foot Right foot dorsum slt  tender. Rt ankle nontender.  Neurologic:  alert & oriented X3.  Speech normal, gait appropriate for age and unassisted Psych--  Cognition and judgment appear intact.  Cooperative with normal attention span and concentration.  Behavior appropriate. No anxious or depressed appearing.     Assessment & Plan:   (Patient seen along with   Aletta Edouard, medical student)

## 2014-09-05 NOTE — Progress Notes (Signed)
Pre visit review using our clinic review tool, if applicable. No additional management support is needed unless otherwise documented below in the visit note. 

## 2014-09-05 NOTE — Patient Instructions (Signed)
Stop by the first floor and get the XR    For cough: Mucinex DM twice a day for one week, then as needed Albuterol 2 puffs 3 times a day as needed for cough, wheezing or chest congestion Ceftin twice a day for one week (an antibiotic) Call if you're not improving gradually in the next few days.

## 2014-09-06 DIAGNOSIS — M1990 Unspecified osteoarthritis, unspecified site: Secondary | ICD-10-CM | POA: Insufficient documentation

## 2014-09-06 DIAGNOSIS — J9801 Acute bronchospasm: Secondary | ICD-10-CM | POA: Insufficient documentation

## 2014-09-06 NOTE — Assessment & Plan Note (Signed)
Respiratory Symptoms: Patient presents with persistent respiratory sx. Patient was seen in ER 4/19 which yielded negative CXR. Patient was started on Doxycycline. Symptoms have not changed much and are impacting patient's sleep. Bronchospasm noted on exam.  Plan: Patient advised on use of Flonase or Nasocort OTC to clear congestion.  Patient instructed to use Mucinex DM twice daily. Patient instructed to use Albuterol 2 puffs three times per day as needed for wheezing/cough. Will prescribe Ceftin to be taken as prescribed.  Patient instructed to let us know if symptoms do not improve.

## 2014-09-06 NOTE — Assessment & Plan Note (Signed)
Ankle Pain:  Patient notes persistent pain in his right ankle and foot. He has been undergoing regular PT and taking 4 tylenol tablets daily with no relief. He does have a history of gout but notes that this feels different. Patient is having difficulty walking with pain and it is preventing him from being active.  Plan: We will x-ray right foot and right ankle today and refer patient to orthopedics for further workup, suspect DJD.

## 2014-09-07 ENCOUNTER — Ambulatory Visit: Payer: Medicare Other | Admitting: Physical Therapy

## 2014-09-11 ENCOUNTER — Encounter: Payer: Self-pay | Admitting: Internal Medicine

## 2014-09-11 ENCOUNTER — Ambulatory Visit (INDEPENDENT_AMBULATORY_CARE_PROVIDER_SITE_OTHER): Payer: Medicare Other | Admitting: Internal Medicine

## 2014-09-11 ENCOUNTER — Telehealth: Payer: Self-pay | Admitting: *Deleted

## 2014-09-11 ENCOUNTER — Telehealth: Payer: Self-pay | Admitting: Internal Medicine

## 2014-09-11 ENCOUNTER — Ambulatory Visit: Payer: Medicare Other | Admitting: Physical Therapy

## 2014-09-11 VITALS — BP 130/82 | HR 90 | Temp 97.6°F | Ht 73.0 in | Wt 189.1 lb

## 2014-09-11 DIAGNOSIS — R410 Disorientation, unspecified: Secondary | ICD-10-CM

## 2014-09-11 DIAGNOSIS — G459 Transient cerebral ischemic attack, unspecified: Secondary | ICD-10-CM | POA: Diagnosis not present

## 2014-09-11 LAB — POCT URINALYSIS DIPSTICK
Bilirubin, UA: NEGATIVE
Blood, UA: NEGATIVE
Glucose, UA: NEGATIVE
Ketones, UA: NEGATIVE
Leukocytes, UA: NEGATIVE
Nitrite, UA: NEGATIVE
Protein, UA: NEGATIVE
Spec Grav, UA: 1.01
Urobilinogen, UA: 0.2
pH, UA: 7

## 2014-09-11 NOTE — Progress Notes (Deleted)
Subjective:    Patient ID: Jeffrey Acosta, male    DOB: 18-Jun-1950, 64 y.o.   MRN: [id number]  DOS:  09/11/2014 Type of visit - description :  Interval history:    Review of Systems     Past Medical History  Diagnosis Date  . 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 Hx of migraines   . Hydrocele   . Diverticulosis of colon (without mention of hemorrhage)   . GERD (gastroesophageal reflux disease)   . Hiatal hernia   . Bipolar affective disorder   . Osteopenia   . Hypothyroidism   . HYPERLIPIDEMIA 01/10/2009    Qualifier: Diagnosis of  By: Ronnald Ramp CMA, Chemira    NMR Lipoprofile 2011 LDL could not be calculated  Due to TG of 889  (811  / 750 ),  HDL 50 . Father MI @ 68 PGM CVA early 87s; PGF CVA @72    . Melanoma of skin, site unspecified 11/25/2012    07/2011 abdominal  Stage 1 Dr Eichinger Lacy [place] Dr Sarajane Jews     Past Surgical History  Procedure Laterality Date  . Colonoscopy  2003    negative ; Dr Sharlett Iles  . Upper gastrointestinal endoscopy  1983    hiatal hernia  . 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 Vasectomy  1979  . Tonsillectomy  1951  . Melanoma excision  07/28/11    Dr Sarajane Jews; stomach and chest  . Inguinal hernia repair  08/07/2011    Procedure: HERNIA REPAIR INGUINAL ADULT;  Surgeon: Rolm Bookbinder, MD;  Location: [facility];  Service: General;  Laterality: Right;    History   Social History  . Marital Status: Married    Spouse Name: N/A  . Number of Children: N/A  . Years of Education: N/A   Occupational History  . Not on file.   Social History Main Topics  . Smoking status: Never Smoker   . Smokeless tobacco: Never Used  . Alcohol Use: No     Comment: former alcoholic; now in remission  . Drug Use: No  . Sexual Activity: Not on file   Other Topics Concern  . Not on file   Social History Narrative     Family History  Problem Relation Age of Onset  . COPD Father   . Heart attack Father 59  . COPD Mother   . Colon  cancer Neg Hx   . Diabetes Paternal Grandmother   . Parkinsonism Paternal Grandmother   . Stroke Paternal Grandmother 59  . Stroke Maternal Grandfather 72       Medication List       This list is accurate as of: 09/11/14  1:57 PM.  Always use your most recent med list.               albuterol 108 (90 BASE) MCG/ACT inhaler  Commonly known as:  VENTOLIN HFA  Inhale 2 puffs into the lungs every 6 (six) hours as needed for wheezing or shortness of breath.     allopurinol 100 MG tablet  Commonly known as:  ZYLOPRIM  Take 200 mg by mouth daily.     cefUROXime 500 MG tablet  Commonly known as:  CEFTIN  Take 1 tablet (500 mg total) by mouth 2 (two) times daily with a meal.     colchicine 0.6 MG tablet  TAKE 2 TABLETS BY MOUTH AT FIRST SIGNS OF GOUT, THEN 1 TABLET IN 1 HOUR  IF NO IMPROVEMENT. CONTINUE TO TAKE 1 TABLET DAILY UNTIL RESOLVED.     lamoTRIgine 100 MG tablet  Commonly known as:  LAMICTAL  Take 100 mg by mouth daily.     levothyroxine 50 MCG tablet  Commonly known as:  SYNTHROID, LEVOTHROID  Take 1 tablet (50 mcg total) by mouth daily.     mesalamine 0.375 G 24 hr capsule  Commonly known as:  APRISO  Take 1.5 g by mouth daily as needed (for IBS).     naltrexone 50 MG tablet  Commonly known as:  DEPADE  Take 50 mg by mouth daily.     propranolol 20 MG tablet  Commonly known as:  INDERAL  Take 20 mg by mouth 4 (four) times daily.     QUEtiapine 100 MG tablet  Commonly known as:  SEROQUEL  Take 400 mg by mouth at bedtime.           Objective:   Physical Exam There were no vitals taken for this visit.       Assessment & Plan:   (Patient seen along with   Aletta Edouard, medical student)

## 2014-09-11 NOTE — Progress Notes (Signed)
Subjective:    Patient ID: Jeffrey Acosta, male    DOB: 1950/08/20, 64 y.o.   MRN: [id number]  DOS:  09/11/2014 Type of visit - description : acute, here with his sister Mickel Baas Interval history: This morning, he was driving, got confused, called his wife; she asked the patient to look for the street signs to locate him and eventually the patient's sister arrived to the site and brought  the patient to the office. At the time she arrived he was still a little confused, he had a "thick tongue" (slurred speech),  Face was symmetric , no focal deficits. The patient was somewhat upset that the family had to go pick him up. This afternoon, the patient does not have a recollection of what happened.  On further questioning,  the last few months he has been slightly confused on and off but not to this degree although had a similar episode a month ago for which he went to the ER.    Review of Systems  No recent chest pain, difficulty breathing, palpitations. No nausea, vomiting, diarrhea. No headache, dizziness, diplopia.  Past Medical History  Diagnosis Date  . 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 Hx of migraines   . Hydrocele   . Diverticulosis of colon (without mention of hemorrhage)   . GERD (gastroesophageal reflux disease)   . Hiatal hernia   . Bipolar affective disorder   . Osteopenia   . Hypothyroidism   . HYPERLIPIDEMIA 01/10/2009    Qualifier: Diagnosis of  By: Ronnald Ramp CMA, Chemira    NMR Lipoprofile 2011 LDL could not be calculated  Due to TG of 889  (811  / 750 ),  HDL 50 . Father MI @ 33 PGM CVA early 38s; PGF CVA @72    . Melanoma of skin, site unspecified 11/25/2012    07/2011 abdominal  Stage 1 Dr Amy [place] Dr Sarajane Jews     Past Surgical History  Procedure Laterality Date  . Colonoscopy  2003    negative ; Dr Sharlett Iles  . Upper gastrointestinal endoscopy  1983    hiatal hernia  . 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 Vasectomy  1979  . Tonsillectomy  1951  .  Melanoma excision  07/28/11    Dr Sarajane Jews; stomach and chest  . Inguinal hernia repair  08/07/2011    Procedure: HERNIA REPAIR INGUINAL ADULT;  Surgeon: Rolm Bookbinder, MD;  Location: [facility];  Service: General;  Laterality: Right;    History   Social History  . Marital Status: Married    Spouse Name: [name]  . Number of Children: 1  . Years of Education: N/A   Occupational History  . retired    Social History Main Topics  . Smoking status: Never Smoker   . Smokeless tobacco: Never Used  . Alcohol Use: No     Comment: former alcoholic; now in remission  . Drug Use: No  . Sexual Activity: Not on file   Other Topics Concern  . Not on file   Social History Narrative   1 child, 2 step children   Household- pt, wife    Leo Rod [place]        Medication List       This list is accurate as of: 09/11/14  2:53 PM.  Always use your most recent med list.  albuterol 108 (90 BASE) MCG/ACT inhaler  Commonly known as:  VENTOLIN HFA  Inhale 2 puffs into the lungs every 6 (six) hours as needed for wheezing or shortness of breath.     allopurinol 100 MG tablet  Commonly known as:  ZYLOPRIM  Take 200 mg by mouth daily.     cefUROXime 500 MG tablet  Commonly known as:  CEFTIN  Take 1 tablet (500 mg total) by mouth 2 (two) times daily with a meal.     colchicine 0.6 MG tablet  TAKE 2 TABLETS BY MOUTH AT FIRST SIGNS OF GOUT, THEN 1 TABLET IN 1 HOUR IF NO IMPROVEMENT. CONTINUE TO TAKE 1 TABLET DAILY UNTIL RESOLVED.     divalproex 125 MG DR tablet  Commonly known as:  DEPAKOTE  Take 125 mg by mouth 3 (three) times daily. Dose??     lamoTRIgine 100 MG tablet  Commonly known as:  LAMICTAL  Take 100 mg by mouth daily.     levothyroxine 50 MCG tablet  Commonly known as:  SYNTHROID, LEVOTHROID  Take 1 tablet (50 mcg total) by mouth daily.     mesalamine 0.375 G 24 hr capsule  Commonly known as:  APRISO  Take 1.5  g by mouth daily as needed (for IBS).     naltrexone 50 MG tablet  Commonly known as:  DEPADE  Take 50 mg by mouth daily.     propranolol 20 MG tablet  Commonly known as:  INDERAL  Take 20 mg by mouth 4 (four) times daily.     QUEtiapine 100 MG tablet  Commonly known as:  SEROQUEL  Take 400 mg by mouth at bedtime.           Objective:   Physical Exam BP 130/82 mmHg  Pulse 90  Temp(Src) 97.6 F (36.4 C) (Oral)  Ht 6\' 1"  (1.854 m)  Wt 189 lb 2 oz (85.787 kg)  BMI 24.96 kg/m2  SpO2 96%  General:   Well developed, well nourished . NAD.  HEENT:  Normocephalic . Face symmetric, atraumatic Neck: Normal carotid pulses Lungs:  CTA B Normal respiratory effort, no intercostal retractions, no accessory muscle use. Heart: RRR,  no murmur.  no pretibial edema bilaterally    Skin: Not pale. Not jaundice Neurologic:  alert & oriented to self and time but not to place. Recognizes his sister.Marland Kitchen  Speech slow but otherwise  normal, gait appropriate for age and unassisted No motor deficits, DTRs symmetric EOMI, pupils equal and reactive  Psych--   Cooperative with normal attention span and concentration.  Behavior appropriate. No anxious or depressed appearing.      Assessment & Plan:    Episodes of severe confusion DDX includes TIA, medication side effects. Is  taking Lamictal, Seroquel, unclear if he is taking depakote and depade  Plan: Brain MRI, echo, carotid ultrasound Asa 81    Labs: Depakote level, CBC, A1c, sedimentation rate, UDS He has an appointment to see psychiatry Dr. Toy Care tomorrow, will try to talk to her prior to the appointment Referral to general neurology, has seen Dr. Vira Browns before  rec not to drive  Addendum, unable to speak with her directly, spoke with her office, will fax is not now. JP 09/09/2014

## 2014-09-11 NOTE — Telephone Encounter (Signed)
error 

## 2014-09-11 NOTE — Telephone Encounter (Signed)
FYI

## 2014-09-11 NOTE — Telephone Encounter (Signed)
Called Almira Bar at [facility]: [phone], cell- and left message on voicemail to request call back.

## 2014-09-11 NOTE — Telephone Encounter (Signed)
Caller: Sister, Leo Rod  Tel: [phone], cell  Pt called wife not knowing where he was. He was driving and got disoriented. Sister Mickel Baas is trying to locate him as wife asked him to stay where he was. She said pt has been on and off disoriented for a couple of days. She asked if there is something we can do immediately. I scheduled available appt at 2:15pm with Dr. Larose Kells today. Please call sister .

## 2014-09-11 NOTE — Progress Notes (Signed)
Pre visit review using our clinic review tool, if applicable. No additional management support is needed unless otherwise documented below in the visit note. 

## 2014-09-11 NOTE — Telephone Encounter (Signed)
Almira Bar called stating that she was with patient.  She picked him up and he was like "a deer in a headlight" and was irritable.  He is calmer now and is acting more his normal, but seems tired.  He is oriented at the moment.  She states his speech is slurry, but this is normal for him- she reports he talks "thick tongued."   She states that he has not had any medication changes- he has not taken the lithium since his ED visit in April.  He was acting normally Saturday and Sunday.  Her concern is that this keeps happening where he will get confused and lost.  She is concerned for a UTI or Alzheimer's.  She will be with him at his appointment at 2:15.

## 2014-09-11 NOTE — Patient Instructions (Signed)
Get your blood work before you leave  Start an Aspirin 81 mg 1 a day  Come back to the office in 4 weeks   for a routine check up

## 2014-09-12 ENCOUNTER — Ambulatory Visit (HOSPITAL_BASED_OUTPATIENT_CLINIC_OR_DEPARTMENT_OTHER)
Admission: RE | Admit: 2014-09-12 | Discharge: 2014-09-12 | Disposition: A | Discharge status: Home / Self Care | Payer: Medicare Other | Source: Ambulatory Visit | Attending: Internal Medicine | Admitting: Internal Medicine

## 2014-09-12 DIAGNOSIS — R410 Disorientation, unspecified: Secondary | ICD-10-CM | POA: Diagnosis not present

## 2014-09-12 DIAGNOSIS — R42 Dizziness and giddiness: Secondary | ICD-10-CM | POA: Insufficient documentation

## 2014-09-12 DIAGNOSIS — R4781 Slurred speech: Secondary | ICD-10-CM | POA: Diagnosis not present

## 2014-09-12 LAB — CBC WITH DIFFERENTIAL/PLATELET
Basophils Absolute: 0 10*3/uL (ref 0.0–0.1)
Basophils Relative: 0.8 % (ref 0.0–3.0)
Eosinophils Absolute: 0.1 10*3/uL (ref 0.0–0.7)
Eosinophils Relative: 1.4 % (ref 0.0–5.0)
HCT: 43.3 % (ref 39.0–52.0)
Hemoglobin: 15.1 g/dL (ref 13.0–17.0)
Lymphocytes Relative: 29.6 % (ref 12.0–46.0)
Lymphs Abs: 1.6 10*3/uL (ref 0.7–4.0)
MCHC: 35 g/dL (ref 30.0–36.0)
MCV: 92.3 fl (ref 78.0–100.0)
Monocytes Absolute: 0.4 10*3/uL (ref 0.1–1.0)
Monocytes Relative: 8 % (ref 3.0–12.0)
Neutro Abs: 3.3 10*3/uL (ref 1.4–7.7)
Neutrophils Relative %: 60.2 % (ref 43.0–77.0)
Platelets: 207 10*3/uL (ref 150.0–400.0)
RBC: 4.69 Mil/uL (ref 4.22–5.81)
RDW: 14.4 % (ref 11.5–15.5)
WBC: 5.4 10*3/uL (ref 4.0–10.5)

## 2014-09-12 LAB — DRUG SCREEN, URINE
Amphetamine Screen, Ur: NEGATIVE
Barbiturate Quant, Ur: NEGATIVE
Benzodiazepines.: NEGATIVE
Cocaine Metabolites: NEGATIVE
Creatinine,U: 54.74 mg/dL
Marijuana Metabolite: NEGATIVE
Methadone: NEGATIVE
Opiates: NEGATIVE
Phencyclidine (PCP): NEGATIVE
Propoxyphene: NEGATIVE

## 2014-09-12 LAB — HEMOGLOBIN A1C: Hgb A1c MFr Bld: 4.7 % (ref 4.6–6.5)

## 2014-09-12 LAB — SEDIMENTATION RATE: Sed Rate: 7 mm/hr (ref 0–22)

## 2014-09-13 LAB — VALPROIC ACID LEVEL

## 2014-09-14 ENCOUNTER — Other Ambulatory Visit: Payer: Self-pay

## 2014-09-14 ENCOUNTER — Ambulatory Visit
Admission: RE | Admit: 2014-09-14 | Discharge: 2014-09-14 | Disposition: A | Discharge status: Home / Self Care | Payer: Medicare Other | Source: Ambulatory Visit | Attending: Internal Medicine | Admitting: Internal Medicine

## 2014-09-14 ENCOUNTER — Telehealth: Payer: Self-pay | Admitting: Internal Medicine

## 2014-09-14 ENCOUNTER — Ambulatory Visit: Payer: Medicare Other | Admitting: Rehabilitation

## 2014-09-14 ENCOUNTER — Ambulatory Visit (HOSPITAL_COMMUNITY): Payer: Medicare Other | Attending: Cardiology

## 2014-09-14 DIAGNOSIS — R410 Disorientation, unspecified: Secondary | ICD-10-CM

## 2014-09-14 NOTE — Addendum Note (Signed)
Addended by: Modena Morrow D on: 09/14/2014 10:51 AM   Modules accepted: Orders

## 2014-09-14 NOTE — Telephone Encounter (Signed)
LMOM for Pt's sister, Mickel Baas, informing her of lab results, MRI and Carotid Ultrasound results. Informed her that we would still like to check a Depakote level on Pt for him to return to lab at his convenience. Informed her that Neurology referral has been placed and is in Neurology que awaiting insurance response. Informed her to call if she has any questions.

## 2014-09-14 NOTE — Telephone Encounter (Signed)
MRI w/ minimal ischemic changes, carotid ultrasound with mild carotid disease less than 50% Labs okay, UDS negative, Depakote level not done  Please call the patient's sister Mickel Baas [phone] (as agreed when I saw the patient) Labs and x-rays essentially okay. If he is still taking Depakote please come back for a redraw of blood . Based on these results, plan is the same, needs to see neurology

## 2014-09-14 NOTE — Telephone Encounter (Signed)
Spoke with Mickel Baas, Pt's sister, she informed me that Pt is somewhat better, not as confused as the other day, but is having trouble walking and stumbling around. She informed me that they returned today to the lab for more blood work, she is assuming for Depakote levels. Informed her that if anything comes back abnormal during the weekend, I'm pretty sure that they will receive a call. Mickel Baas verbalized understanding and wanted to thank [place] for all that we are doing.

## 2014-09-15 LAB — VALPROIC ACID LEVEL: Valproic Acid Lvl: 75.9 ug/mL (ref 50.0–100.0)

## 2014-09-20 ENCOUNTER — Encounter: Payer: Self-pay | Admitting: Neurology

## 2014-09-20 ENCOUNTER — Ambulatory Visit (INDEPENDENT_AMBULATORY_CARE_PROVIDER_SITE_OTHER): Payer: Medicare Other | Admitting: Neurology

## 2014-09-20 VITALS — BP 154/95 | HR 85 | Ht 73.0 in | Wt 192.4 lb

## 2014-09-20 DIAGNOSIS — R404 Transient alteration of awareness: Secondary | ICD-10-CM | POA: Diagnosis not present

## 2014-09-20 NOTE — Patient Instructions (Signed)
Overall you are doing fairly well but I do want to suggest a few things today:   Remember to drink plenty of fluid, eat healthy meals and do not skip any meals. Try to eat protein with a every meal and eat a healthy snack such as fruit or nuts in between meals. Try to keep a regular sleep-wake schedule and try to exercise daily, particularly in the form of walking, 20-30 minutes a day, if you can.   As far as diagnostic testing: EEG  I would like to see you back in 3 months, sooner if we need to. Please call us with any interim questions, concerns, problems, updates or refill requests.   Please also call us for any test results so we can go over those with you on the phone.  My clinical assistant and will answer any of your questions and relay your messages to me and also relay most of my messages to you.   Our phone number is [phone]. We also have an after hours call service for urgent matters and there is a physician on-call for urgent questions. For any emergencies you know to call 911 or go to the nearest emergency room

## 2014-09-20 NOTE — Progress Notes (Signed)
[facility]    Provider:  Dr Jaynee Acosta Referring Provider: Colon Branch, MD Primary Care Physician:  Jeffrey November, MD  CC: Confusion  HPI:  Jeffrey Acosta is a 64 y.o. male here as a referral from Jeffrey. Larose Acosta for Mental confusion.  He has a past medical history of alcoholism in remission  per patient and wife, migraines, bipolar affective disorder, hypothyroidism, hyperlipidemia.  Here with wife who provides most information.   Confusion started in January and worsened.Patient was taking lithium for his bipolar disorder which was discontinued in April due to lithium toxicity.   Confusion persists despite discontinuation of lithium. He has been very high functioning in the past.  But he finds himself misplacing things, forgetting commitments  And even getting lost and losing time. He was lost for 3 hours trying to find his wife's work, he took his wife's car to be serviced and was very, very late and when she called him  On the phone he was acting eradicaly and finally said he didn't know where he was. She has worked in the same building for 8 years  So there was no reason for him to get lost.  Patient does not remember what he was doing in the 3 hours. He denies drug or alcohol use. He doesn't remember being lost for 3 hours, got done the service at [place] and lost time until noon and doesn't remember it at all. He is having other episodes of losing time, an hour to two hour stretches. His psychiatrist thinks this is  Lingering from the lithium toxicity, it was severe. He is also having occassional slurred speech, sluggish. Around 4-5 or 6pm he needs to sleep, exhausted. Last week after he got lost, he was quiet, didn't want to eat, he was just exhausted. He was physically exhausted from his 3 hour episode  After getting lost. he was somber for 3 days after  and very tired.   Denies facial droop, or other focal neurologic deficits.  No tongue biting or loss of urine.  No personal or family history  of seizures. He was seen by family medicine who ordered MRI of the brain, echo, carotid ultrasound  And started him on aspirin 81 mg for stroke prevention.  Wife now manages all medications.  State that he is not on the Lamictal currently and his Depakote has been decreased.   Reviewed notes, labs and imaging from outside physicians, which showed: patient was evaluated by Jeffrey Acosta little over a month ago for tremor and gait changes. Patient is also seeing Jeffrey Acosta in the past. [facility] had diagnosed patient with essential tremor versus Depakote -induced tremor versus effects of alcohol.  Jeffrey Acosta  Was severely concerned about lithium levels  Due to the tremor, increase in creatinine , spontaneous nystagmus , and myoclonus. She was also concerned about the patient's increase of Seroquel at night.  It is after this appointmente that his wife took over medical management and now administers  Pills to patient.   valproic acid level 75 on 09/14/2014.   drug screen urine on 09/11/2014 was negative  CBC, sed rate, hgba1c,  on 09/11/2014 was normal   BMP on 08/28/2014 was normal  TSH on 08/28/2014 was mildly low 0.19  Review of Systems: Patient complains of symptoms per HPI as well as the following symptoms:  Fatigue, memory loss, confusion, slurred speech, tremor, joint pain, joint swelling , depression, decreased energy. Pertinent negatives per HPI. All others negative.  History   Social History  . Marital Status: Married    Spouse Name: Jeffrey Acosta  . Number of Children: 1  . Years of Education: N/A   Occupational History  . retired    Social History Main Topics  . Smoking status: Never Smoker   . Smokeless tobacco: Never Used  . Alcohol Use: No     Comment: former alcoholic; now in remission  . Drug Use: No  . Sexual Activity: Not on file   Other Topics Concern  . Not on file   Social History Narrative   1 child, 2 step children   Household- pt, wife    Jeffrey Acosta    Family History  Problem Relation Age of Onset  . COPD Father   . Heart attack Father 15  . COPD Mother   . Jeffrey cancer Neg Hx   . Diabetes Paternal Grandmother   . Parkinsonism Paternal Grandmother   . Stroke Paternal Grandmother 76  . Stroke Maternal Grandfather 72    Past Medical History  Diagnosis Date  . 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 Hx of migraines   . Hydrocele   . Diverticulosis of Jeffrey (without mention of hemorrhage)   . GERD (gastroesophageal reflux disease)   . Hiatal hernia   . Bipolar affective disorder   . Osteopenia   . Hypothyroidism   . HYPERLIPIDEMIA 01/10/2009    Qualifier: Diagnosis of  By: Jeffrey Acosta    NMR Lipoprofile 2011 LDL could not be calculated  Due to TG of 889  (811  / 750 ),  HDL 50 . Father MI @ 74 PGM CVA early 51s; PGF CVA @72    . Melanoma of skin, site unspecified 11/25/2012    07/2011 abdominal  Stage 1 Jeffrey Jeffrey Acosta Jeffrey Sarajane Jews     Past Surgical History  Procedure Laterality Date  . Colonoscopy  2003    negative ; Jeffrey Sharlett Iles  . Upper gastrointestinal endoscopy  1983    hiatal hernia  . 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 Vasectomy  1979  . Tonsillectomy  1951  . Melanoma excision  07/28/11    Jeffrey Sarajane Jews; stomach and chest  . Inguinal hernia repair  08/07/2011    Procedure: HERNIA REPAIR INGUINAL ADULT;  Surgeon: Rolm Bookbinder, MD;  Location: [facility];  Service: General;  Laterality: Right;    Current Outpatient Prescriptions  Medication Sig Dispense Refill  . aspirin 81 MG tablet Take 81 mg by mouth daily.    . colchicine 0.6 MG tablet TAKE 2 TABLETS BY MOUTH AT FIRST SIGNS OF GOUT, THEN 1 TABLET IN 1 HOUR IF NO IMPROVEMENT. CONTINUE TO TAKE 1 TABLET DAILY UNTIL RESOLVED. 30 tablet 0  . divalproex (DEPAKOTE) 125 MG Jeffrey tablet Take 400 mg by mouth daily. Dose??    . levothyroxine (SYNTHROID, LEVOTHROID) 50 MCG tablet Take 1 tablet (50 mcg  total) by mouth daily. 30 tablet 1  . QUEtiapine (SEROQUEL) 100 MG tablet Take 400 mg by mouth at bedtime.   98  . allopurinol (ZYLOPRIM) 100 MG tablet Take 200 mg by mouth daily.    . mesalamine (APRISO) 0.375 G 24 hr capsule Take 1.5 g by mouth daily as needed (for IBS).     . naltrexone (DEPADE) 50 MG tablet Take 50 mg by mouth daily.      . propranolol (INDERAL) 20 MG  tablet Take 20 mg by mouth 4 (four) times daily.     No current facility-administered medications for this visit.    Allergies as of 09/20/2014 - Review Complete 09/11/2014  Allergen Reaction Noted  . Omnipaque [iohexol]  11/14/2004    Vitals: BP 154/95 mmHg  Pulse 85  Ht 6\' 1"  (1.854 m)  Wt 192 lb 6.4 oz (87.272 kg)  BMI 25.39 kg/m2 Last Weight:  Wt Readings from Last 1 Encounters:  09/20/14 192 lb 6.4 oz (87.272 kg)   Last Height:   Ht Readings from Last 1 Encounters:  09/20/14 6\' 1"  (1.854 m)   Physical exam: Exam: Gen: NAD, conversant, well nourised, obese, well groomed                     CV: RRR, no MRG. No Carotid Bruits. No peripheral edema, warm, nontender Eyes: Conjunctivae clear without exudates or hemorrhage  Neuro: Detailed Neurologic Exam  Speech:    Speech is normal; fluent and spontaneous with normal comprehension.  Cognition:    The patient is oriented to person, place, and time;     recent and remote memory intact;     language fluent;     normal attention, concentration,     fund of knowledge Cranial Nerves:    The pupils are equal, round, and reactive to light. The fundi are normal and spontaneous venous pulsations are present. Visual fields are full to finger confrontation. Extraocular movements are intact. Trigeminal sensation is intact and the muscles of mastication are normal. The face is symmetric. The palate elevates in the midline. Hearing intact. Voice is normal. Shoulder shrug is normal. The tongue has normal motion without fasciculations.   Coordination:    Normal  finger to nose and heel to shin. Normal rapid alternating movements.   Gait:     Can get out of the seat without using hands.   No ataxia. Can walk on heel and toes. Motor Observation:    No asymmetry, no atrophy, and no involuntary movements noted.     No tremor is noted today. Tone:    Normal muscle tone.    Posture:    Posture is normal. normal erect    Strength:    Strength is V/V in the upper and lower limbs.      Sensation:  Decreased vibration distally, otherwise patchy sensory loss.     Reflex Exam:  DTR's:    Deep tendon reflexes in the upper and lower extremities are  Symmetrical bilaterally.   Toes:    The toes are downgoing bilaterally.   Clonus:    Clonus is absent.   Assessment/Plan:   Alisha Burgo is a 64 y.o. male here as a referral from Jeffrey. Larose Acosta for Mental confusion.  He has a past medical history of alcoholism in remission, migraines, bipolar affective disorder, hypothyroidism, hyperlipidemia.  He is having  Increasing episodes of confusion, losing time, getting lost and not recalling his actions or whereabouts for many hours.  Patient strongly denied any alcohol or drug abuse during these episodes.   EEG to evaluate for complex partial seizures.  Episodes are concerning especially since patient cannot recall his whereabouts or his actions for many hours. If EEG is negative , this does not rule out seizures. After routine EEG is performed , if unrevealing can also schedule extended sleep deprived EEG. Discussed antiepileptic drugs, and asked why patient's Lamictal was discontinued.   Wife stated they've been trying to discontinue any medications that  are not needed.  They also decreased his Depakote from 1500mg  ER to 500mg  ER qhs. Given his bipolar disorder, if EEG shows epileptiform activity, Lamictal  may be a good medication to restart.  They prefer to wait until after EEG workup.  Offered to administer comprehensive urine drug screen, patient and wife  declined.   no driving or performing any activity such as swimming alone, bathing alone or anything that might hurt patient or  anyone else should he have another episode of transient altered awareness. He is in the house all day alone, recommended someone stay with him. Do not cook alone or perform any tasks that could result in harmful situation should you have a period of altered awareness.  Sarina Ill, MD  [facility] [address] [place], [place] [address]  Phone [phone] Fax [phone]

## 2014-09-21 LAB — URINALYSIS, ROUTINE W REFLEX MICROSCOPIC
Bilirubin, UA: NEGATIVE
Glucose, UA: NEGATIVE
Nitrite, UA: NEGATIVE
RBC, UA: NEGATIVE
Specific Gravity, UA: 1.023 (ref 1.005–1.030)
Urobilinogen, Ur: 0.2 mg/dL (ref 0.2–1.0)
pH, UA: 6 (ref 5.0–7.5)

## 2014-09-21 LAB — MICROSCOPIC EXAMINATION
Bacteria, UA: NONE SEEN
Casts: NONE SEEN /lpf
Epithelial Cells (non renal): NONE SEEN /hpf (ref 0–10)

## 2014-09-25 ENCOUNTER — Other Ambulatory Visit: Payer: Self-pay

## 2014-09-25 ENCOUNTER — Telehealth: Payer: Self-pay | Admitting: Neurology

## 2014-09-25 NOTE — Telephone Encounter (Signed)
Spoke to patient and wife, urinalysis with a few WBCs and trace leukocyte esterase but no bacteria or growth seen. He is asymptomatic. He is also feeling better, no confusion. Don't think it is significant. If he becomes altered or has symptoms, follow up with pcp for repeat UA asap.

## 2014-09-27 ENCOUNTER — Ambulatory Visit (INDEPENDENT_AMBULATORY_CARE_PROVIDER_SITE_OTHER): Payer: Medicare Other | Admitting: Neurology

## 2014-09-27 DIAGNOSIS — R404 Transient alteration of awareness: Secondary | ICD-10-CM | POA: Diagnosis not present

## 2014-09-27 NOTE — Procedures (Signed)
    History:  Jeffrey Acosta is a 64 year old gentleman with a history of alcoholism in the past. The patient also has a history of migraine headaches and bipolar affect disorder. The patient has had confusion that began in January 2016. In April, he was noted to have lithium toxicity, and the medication was discontinued. The patient continues to have ongoing confusion, and losing track of time. The patient being evaluated for this issue.  This is a routine EEG. No skull defects are noted. Medications include allopurinol, aspirin, colchicine, Depakote, Lamictal, Synthroid, Apriso, naltrexone, Inderal, Seroquel, and Ventolin inhaler.   EEG classification: Normal awake  Description of the recording: The background rhythms of this recording consists of a fairly well modulated medium amplitude alpha rhythm of 10 Hz that is reactive to eye opening and closure. As the record progresses, the patient appears to remain in the waking state throughout the recording. Photic stimulation was performed, resulting in a bilateral and symmetric photic driving response. Hyperventilation was also performed, resulting in a minimal buildup of the background rhythm activities without significant slowing seen. At no time during the recording does there appear to be evidence of spike or spike wave discharges or evidence of focal slowing. EKG monitor shows no evidence of cardiac rhythm abnormalities with a heart rate of 66.  Impression: This is a normal EEG recording in the waking state. No evidence of ictal or interictal discharges are seen.

## 2014-09-28 ENCOUNTER — Telehealth: Payer: Self-pay

## 2014-09-28 ENCOUNTER — Telehealth: Payer: Self-pay | Admitting: Internal Medicine

## 2014-09-28 NOTE — Telephone Encounter (Signed)
Per neurology notes, no driving.

## 2014-09-28 NOTE — Telephone Encounter (Signed)
Caller name: Mickel Baas Relation to pt: sister Call back number:  [phone] Pharmacy:  Reason for call:   Needs to know if patient should drive or not.

## 2014-09-28 NOTE — Telephone Encounter (Signed)
Please advise 

## 2014-09-28 NOTE — Telephone Encounter (Signed)
No, no driving for 6 months. One normal eeg does not rule out seizures. Please let him know, Thanks.

## 2014-09-28 NOTE — Telephone Encounter (Signed)
Spoke with Pt, asked him how he was doing. Pt states he has felt the best he has felt in a while. He informed me that his EEG was normal but is waiting to hear back from Dr. Jaynee Eagles on Monday. Pt wanted to know if he could drive. I informed him, per Dr. Larose Kells he should NOT drive, but informed him he could discuss with Dr. Jaynee Eagles on Monday when she calls to F/U with him. Pt stated that it is very hard for him to go to [place] meetings without being able to drive, and that he really wanted to go. Informed him to just wait until Monday to speak with Dr. Jaynee Eagles, Pt verbalized understanding.

## 2014-09-28 NOTE — Telephone Encounter (Signed)
Patient informed not to drive for 6 months he is ok with this and has no other questions at this time

## 2014-09-28 NOTE — Telephone Encounter (Signed)
Spoke with Mickel Baas, Pt's sister, informed her that per Neurology notes and Dr. Larose Kells, the Pt can NOT drive at this time. Mickel Baas verbalized understanding and informed me that she is out of town but has basically hidden his car keys, and that she keeps getting text messages from the Pt questioning where the keys are. Mickel Baas informed me that she would like for me to call and inform Pt that he can not drive but without sounding like I spoke with her behind his back. I informed her that I was unsure how to contact Pt without just telling him that I spoke with her and wanted to inform him not to drive.

## 2014-09-28 NOTE — Telephone Encounter (Signed)
Patient was informed of normal EEG he was also told about extended EEG and agrees.  He states he voluntarily agreed not drive, but now that EEG is normal can he drive?

## 2014-09-28 NOTE — Telephone Encounter (Signed)
Caller name: Jeffrey Acosta Relation to pt: self Call back number: Pharmacy:  Reason for call:   Patient called in stating that he no longer wants sister Mickel Baas on [name]. I took her off of DPR but told patient to come in and update/sign a new DPR

## 2014-10-05 ENCOUNTER — Encounter: Payer: Self-pay | Admitting: Neurology

## 2014-10-05 ENCOUNTER — Ambulatory Visit (INDEPENDENT_AMBULATORY_CARE_PROVIDER_SITE_OTHER): Payer: Medicare Other | Admitting: Neurology

## 2014-10-05 DIAGNOSIS — R410 Disorientation, unspecified: Secondary | ICD-10-CM | POA: Diagnosis not present

## 2014-10-05 NOTE — Procedures (Signed)
    History:  Jeffrey Acosta is a 64 year old gentleman with a history of alcoholism in the past. The patient also has a history of migraine headaches and bipolar affect disorder. The patient has had confusion that began in January 2016. In April, he was noted to have lithium toxicity, and the medication was discontinued. The patient continues to have ongoing confusion, and losing track of time. The patient being evaluated for this issue.  This is a sleep deprived EEG. No skull defects are noted. Medications include allopurinol, aspirin, colchicine, Depakote, Lamictal, Synthroid, Apriso, naltrexone, Inderal, Seroquel, and Ventolin inhaler.  EEG classification: Normal awake and drowsy  Description of the recording: The background rhythms of this recording consists of a fairly well modulated medium amplitude alpha rhythm of 9 Hz that is reactive to eye opening and closure. As the record progresses, the patient appears to remain in the waking state throughout the recording. Photic stimulation was performed, resulting in a bilateral and symmetric photic driving response. Hyperventilation was also performed, resulting in a minimal buildup of the background rhythm activities without significant slowing seen. Toward the end of the recording, the patient enters the drowsy state with slight symmetric slowing seen. The patient never enters stage II sleep. At no time during the recording does there appear to be evidence of spike or spike wave discharges or evidence of focal slowing. EKG monitor shows no evidence of cardiac rhythm abnormalities with a heart rate of 66.  Impression: This is a normal EEG recording in the waking and drowsy state. No evidence of ictal or interictal discharges are seen.

## 2014-10-08 ENCOUNTER — Telehealth: Payer: Self-pay | Admitting: Neurology

## 2014-10-08 NOTE — Telephone Encounter (Signed)
Patient is calling and states that his EEG showed no abnormality.  He would like to know if he can resume normal activities such driving, etc.  Please call.

## 2014-10-08 NOTE — Telephone Encounter (Signed)
Patient called stating he would really appreciate a call back today before RN leaves. Please call and advise.

## 2014-10-08 NOTE — Telephone Encounter (Signed)
Spoke with pt to let him know I was waiting to speak with Dr. Jaynee Eagles on whether he can start driving. He sated "I have not had an episode in 3 weeks". I advised him to still not drive because he could be putting himself and others at danger. He was wondering if he decided to drive that it would be on him. I highly recommended him to not drive. He stated he understood but he might anyway's because it is interfering with his day to day activities such as going to the grocery store, [place] meetings, ect. I recommended he look into [place] transportation and he stated he has already spent hundreds of dollars for transportation. I told him I will let Dr Jaynee Eagles know and we will call him back. He verbalized understanding.

## 2014-10-08 NOTE — Telephone Encounter (Signed)
I recommend he not drive until he has no symptoms for 6 months. EEGs were unrevealing, no epileptiform activity but this does not rule out seizures. He will follow up in the office. I recommend starting AEDs. He will discuss with his wife and they will follow up in my office together to further decide.

## 2014-10-09 ENCOUNTER — Other Ambulatory Visit: Payer: Medicare Other

## 2014-10-12 ENCOUNTER — Ambulatory Visit (INDEPENDENT_AMBULATORY_CARE_PROVIDER_SITE_OTHER): Payer: Medicare Other | Admitting: Internal Medicine

## 2014-10-12 ENCOUNTER — Encounter: Payer: Self-pay | Admitting: Internal Medicine

## 2014-10-12 VITALS — BP 128/78 | HR 79 | Temp 97.8°F | Ht 73.0 in | Wt 194.1 lb

## 2014-10-12 DIAGNOSIS — F319 Bipolar disorder, unspecified: Secondary | ICD-10-CM

## 2014-10-12 DIAGNOSIS — R410 Disorientation, unspecified: Secondary | ICD-10-CM | POA: Diagnosis not present

## 2014-10-12 DIAGNOSIS — E039 Hypothyroidism, unspecified: Secondary | ICD-10-CM

## 2014-10-12 LAB — TSH: TSH: 2.22 u[IU]/mL (ref 0.35–4.50)

## 2014-10-12 NOTE — Assessment & Plan Note (Addendum)
Since the last visit brain MRI, echocardiogram and ultrasound were essentially okay. He saw neurology, psychiatry, had 2 EEGs. Reports no further episodes and that he was "clear" to drive by neurology. Neurology note reviewed, they stated no driving until he is symptom free for 6 months, this was reinforced to the patient but he tells me that at the end of the conversation it was left it  up to him and his wife and he made a decision to continue driving. I will let neurology know about this.

## 2014-10-12 NOTE — Assessment & Plan Note (Signed)
Check labs 

## 2014-10-12 NOTE — Assessment & Plan Note (Signed)
Med list reviewed, not taking depade, he seems to be doing very well today

## 2014-10-12 NOTE — Patient Instructions (Signed)
Go to the lab.

## 2014-10-12 NOTE — Progress Notes (Signed)
Pre visit review using our clinic review tool, if applicable. No additional management support is needed unless otherwise documented below in the visit note. 

## 2014-10-12 NOTE — Progress Notes (Signed)
Subjective:    Patient ID: Jeffrey Acosta, male    DOB: April 24, 1951, 64 y.o.   MRN: [id number]  DOS:  10/12/2014 Type of visit - description : f/u Interval history: Episodes of mental confusion, status post extensive workup, states saw psychiatry and neurology. Doing better, no further events. Medications have change, med list corrected. Patient reports that he was clear to drive again.    Review of Systems Denies drinking alcohol No anxiety or depression   Past Medical History  Diagnosis Date  . 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 Hx of migraines   . Hydrocele   . Diverticulosis of colon (without mention of hemorrhage)   . GERD (gastroesophageal reflux disease)   . Hiatal hernia   . Bipolar affective disorder   . Osteopenia   . Hypothyroidism   . HYPERLIPIDEMIA 01/10/2009    Qualifier: Diagnosis of  By: Ronnald Ramp CMA, [place]    NMR Lipoprofile 2011 LDL could not be calculated  Due to TG of 889  (811  / 750 ),  HDL 50 . Father MI @ 40 PGM CVA early 55s; PGF CVA @72    . Melanoma of skin, site unspecified 11/25/2012    07/2011 abdominal  Stage 1 Dr Amy [place] Dr Sarajane Jews     Past Surgical History  Procedure Laterality Date  . Colonoscopy  2003    negative ; Dr Sharlett Iles  . Upper gastrointestinal endoscopy  1983    hiatal hernia  . 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 Vasectomy  1979  . Tonsillectomy  1951  . Melanoma excision  07/28/11    Dr Sarajane Jews; stomach and chest  . Inguinal hernia repair  08/07/2011    Procedure: HERNIA REPAIR INGUINAL ADULT;  Surgeon: Rolm Bookbinder, MD;  Location: [facility];  Service: General;  Laterality: Right;    History   Social History  . Marital Status: Married    Spouse Name: [name]  . Number of Children: 1  . Years of Education: N/A   Occupational History  . retired    Social History Main Topics  . Smoking status: Never Smoker   . Smokeless tobacco: Never Used  . Alcohol Use: No     Comment: former  alcoholic; now in remission  . Drug Use: No  . Sexual Activity: Not on file   Other Topics Concern  . Not on file   Social History Narrative   1 child, 2 step children   Household- pt, wife    [place]        Medication List       This list is accurate as of: 10/12/14 11:59 PM.  Always use your most recent med list.               allopurinol 300 MG tablet  Commonly known as:  ZYLOPRIM  Take 300 mg by mouth daily.     aspirin 81 MG tablet  Take 81 mg by mouth daily.     colchicine 0.6 MG tablet  TAKE 2 TABLETS BY MOUTH AT FIRST SIGNS OF GOUT, THEN 1 TABLET IN 1 HOUR IF NO IMPROVEMENT. CONTINUE TO TAKE 1 TABLET DAILY UNTIL RESOLVED.     divalproex 500 MG 24 hr tablet  Commonly known as:  DEPAKOTE ER  Take 500 mg by mouth daily.     levothyroxine 50 MCG tablet  Commonly known as:  SYNTHROID,  LEVOTHROID  Take 1 tablet (50 mcg total) by mouth daily.     QUEtiapine 100 MG tablet  Commonly known as:  SEROQUEL  Take 400 mg by mouth at bedtime.     VENTOLIN HFA 108 (90 BASE) MCG/ACT inhaler  Generic drug:  albuterol           Objective:   Physical Exam BP 128/78 mmHg  Pulse 79  Temp(Src) 97.8 F (36.6 C) (Oral)  Ht 6\' 1"  (1.854 m)  Wt 194 lb 2 oz (88.055 kg)  BMI 25.62 kg/m2  SpO2 99% General:   Well developed, well nourished . NAD.  HEENT:  Normocephalic . Face symmetric, atraumatic Lungs:  CTA B Normal respiratory effort, no intercostal retractions, no accessory muscle use. Heart: RRR,  no murmur.  no pretibial edema bilaterally  Neurologic:  alert & oriented X3.  Speech normal, gait appropriate for age and unassisted Psych--  Cognition and judgment appear intact.  Cooperative with normal attention span and concentration.  Behavior appropriate. No anxious or depressed appearing.       Assessment & Plan:

## 2014-10-22 ENCOUNTER — Other Ambulatory Visit: Payer: Self-pay

## 2014-10-22 ENCOUNTER — Other Ambulatory Visit: Payer: Self-pay | Admitting: Emergency Medicine

## 2014-10-22 ENCOUNTER — Other Ambulatory Visit: Payer: Self-pay | Admitting: Internal Medicine

## 2014-10-22 MED ORDER — COLCHICINE 0.6 MG PO TABS: ORAL_TABLET | ORAL | Status: DC

## 2014-10-23 ENCOUNTER — Other Ambulatory Visit: Payer: Self-pay

## 2014-10-23 MED ORDER — COLCHICINE 0.6 MG PO TABS: ORAL_TABLET | ORAL | Status: DC

## 2014-10-23 NOTE — Telephone Encounter (Signed)
Colchicine rx sent to pharm

## 2014-12-11 ENCOUNTER — Ambulatory Visit: Payer: Self-pay | Admitting: Internal Medicine

## 2014-12-22 ENCOUNTER — Encounter (HOSPITAL_COMMUNITY): Payer: Self-pay

## 2014-12-22 ENCOUNTER — Emergency Department (HOSPITAL_COMMUNITY)
Admission: EM | Admit: 2014-12-22 | Discharge: 2014-12-22 | Disposition: A | Discharge status: Home / Self Care | Payer: Medicare Other | Attending: Emergency Medicine | Admitting: Emergency Medicine

## 2014-12-22 DIAGNOSIS — Z8739 Personal history of other diseases of the musculoskeletal system and connective tissue: Secondary | ICD-10-CM | POA: Diagnosis not present

## 2014-12-22 DIAGNOSIS — R4182 Altered mental status, unspecified: Secondary | ICD-10-CM | POA: Diagnosis present

## 2014-12-22 DIAGNOSIS — R410 Disorientation, unspecified: Secondary | ICD-10-CM

## 2014-12-22 DIAGNOSIS — Z79899 Other long term (current) drug therapy: Secondary | ICD-10-CM | POA: Insufficient documentation

## 2014-12-22 DIAGNOSIS — E039 Hypothyroidism, unspecified: Secondary | ICD-10-CM | POA: Insufficient documentation

## 2014-12-22 DIAGNOSIS — Z8719 Personal history of other diseases of the digestive system: Secondary | ICD-10-CM | POA: Diagnosis not present

## 2014-12-22 DIAGNOSIS — Z85820 Personal history of malignant melanoma of skin: Secondary | ICD-10-CM | POA: Diagnosis not present

## 2014-12-22 DIAGNOSIS — G43909 Migraine, unspecified, not intractable, without status migrainosus: Secondary | ICD-10-CM | POA: Insufficient documentation

## 2014-12-22 DIAGNOSIS — F1010 Alcohol abuse, uncomplicated: Secondary | ICD-10-CM | POA: Insufficient documentation

## 2014-12-22 DIAGNOSIS — Z7982 Long term (current) use of aspirin: Secondary | ICD-10-CM | POA: Insufficient documentation

## 2014-12-22 DIAGNOSIS — F319 Bipolar disorder, unspecified: Secondary | ICD-10-CM | POA: Diagnosis not present

## 2014-12-22 LAB — URINALYSIS, ROUTINE W REFLEX MICROSCOPIC
Bilirubin Urine: NEGATIVE
Glucose, UA: NEGATIVE mg/dL
Ketones, ur: NEGATIVE mg/dL
Leukocytes, UA: NEGATIVE
Nitrite: NEGATIVE
Protein, ur: NEGATIVE mg/dL
Specific Gravity, Urine: 1.011 (ref 1.005–1.030)
Urobilinogen, UA: 0.2 mg/dL (ref 0.0–1.0)
pH: 5 (ref 5.0–8.0)

## 2014-12-22 LAB — ETHANOL: Alcohol, Ethyl (B): 309 mg/dL (ref <5)

## 2014-12-22 LAB — COMPREHENSIVE METABOLIC PANEL
ALT: 13 U/L — ABNORMAL LOW (ref 17–63)
AST: 34 U/L (ref 15–41)
Albumin: 4.3 g/dL (ref 3.5–5.0)
Alkaline Phosphatase: 61 U/L (ref 38–126)
Anion gap: 16 — ABNORMAL HIGH (ref 5–15)
BUN: 12 mg/dL (ref 6–20)
CO2: 16 mmol/L — ABNORMAL LOW (ref 22–32)
Calcium: 8.8 mg/dL — ABNORMAL LOW (ref 8.9–10.3)
Chloride: 109 mmol/L (ref 101–111)
Creatinine, Ser: 1.35 mg/dL — ABNORMAL HIGH (ref 0.61–1.24)
GFR calc Af Amer: >60 mL/min (ref >60)
GFR calc non Af Amer: 53 mL/min — ABNORMAL LOW (ref >60)
Glucose, Bld: 98 mg/dL (ref 65–99)
Potassium: 3.9 mmol/L (ref 3.5–5.1)
Sodium: 141 mmol/L (ref 135–145)
Total Bilirubin: 1.4 mg/dL — ABNORMAL HIGH (ref 0.3–1.2)
Total Protein: 6.7 g/dL (ref 6.5–8.1)

## 2014-12-22 LAB — BASIC METABOLIC PANEL
Anion gap: 12 (ref 5–15)
BUN: 11 mg/dL (ref 6–20)
CO2: 17 mmol/L — ABNORMAL LOW (ref 22–32)
Calcium: 7.5 mg/dL — ABNORMAL LOW (ref 8.9–10.3)
Chloride: 115 mmol/L — ABNORMAL HIGH (ref 101–111)
Creatinine, Ser: 1.08 mg/dL (ref 0.61–1.24)
GFR calc Af Amer: >60 mL/min (ref >60)
GFR calc non Af Amer: >60 mL/min (ref >60)
Glucose, Bld: 87 mg/dL (ref 65–99)
Potassium: 3.7 mmol/L (ref 3.5–5.1)
Sodium: 144 mmol/L (ref 135–145)

## 2014-12-22 LAB — URINE MICROSCOPIC-ADD ON

## 2014-12-22 LAB — RAPID URINE DRUG SCREEN, HOSP PERFORMED
Amphetamines: NOT DETECTED
Barbiturates: NOT DETECTED
Benzodiazepines: NOT DETECTED
Cocaine: NOT DETECTED
Opiates: NOT DETECTED
Tetrahydrocannabinol: NOT DETECTED

## 2014-12-22 LAB — CBC
HCT: 37.2 % — ABNORMAL LOW (ref 39.0–52.0)
Hemoglobin: 13.1 g/dL (ref 13.0–17.0)
MCH: 35.4 pg — ABNORMAL HIGH (ref 26.0–34.0)
MCHC: 35.2 g/dL (ref 30.0–36.0)
MCV: 100.5 fL — ABNORMAL HIGH (ref 78.0–100.0)
Platelets: 172 10*3/uL (ref 150–400)
RBC: 3.7 MIL/uL — ABNORMAL LOW (ref 4.22–5.81)
RDW: 21.5 % — ABNORMAL HIGH (ref 11.5–15.5)
WBC: 3.2 10*3/uL — ABNORMAL LOW (ref 4.0–10.5)

## 2014-12-22 LAB — CBG MONITORING, ED: Glucose-Capillary: 104 mg/dL — ABNORMAL HIGH (ref 65–99)

## 2014-12-22 MED ORDER — SODIUM CHLORIDE 0.9 % IV BOLUS (SEPSIS)
2000.0000 mL | Freq: Once | INTRAVENOUS | Status: AC
  Administered 2014-12-22: 2000 mL via INTRAVENOUS

## 2014-12-22 NOTE — Discharge Instructions (Signed)
Confusion Confusion is the inability to think with your usual speed or clarity. Confusion may come on quickly or slowly over time. How quickly the confusion comes on depends on the cause. Confusion can be due to any number of causes. CAUSES   Concussion, head injury, or head trauma.  Seizures.  Stroke.  Fever.  Brain tumor.  Age related decreased brain function (dementia).  Heightened emotional states like rage or terror.  Mental illness in which the person loses the ability to determine what is real and what is not (hallucinations).  Infections such as a urinary tract infection (UTI).  Toxic effects from alcohol, drugs, or prescription medicines.  Dehydration and an imbalance of salts in the body (electrolytes).  Lack of sleep.  Low blood sugar (diabetes).  Low levels of oxygen from conditions such as chronic lung disorders.  Drug interactions or other medicine side effects.  Nutritional deficiencies, especially niacin, thiamine, vitamin C, or vitamin B.  Sudden drop in body temperature (hypothermia).  Change in routine, such as when traveling or hospitalized. SIGNS AND SYMPTOMS  People often describe their thinking as cloudy or unclear when they are confused. Confusion can also include feeling disoriented. That means you are unaware of where or who you are. You may also not know what the date or time is. If confused, you may also have difficulty paying attention, remembering, and making decisions. Some people also act aggressively when they are confused.  DIAGNOSIS  The medical evaluation of confusion may include:  Blood and urine tests.  X-rays.  Brain and nervous system tests.  Analyzing your brain waves (electroencephalogram or EEG).  Magnetic resonance imaging (MRI) of your head.  Computed tomography (CT) scan of your head.  Mental status tests in which your health care provider may ask many questions. Some of these questions may seem silly or strange,  but they are a very important test to help diagnose and treat confusion. TREATMENT  An admission to the hospital may not be needed, but a person with confusion should not be left alone. Stay with a family member or friend until the confusion clears. Avoid alcohol, pain relievers, or sedative drugs until you have fully recovered. Do not drive until directed by your health care provider. HOME CARE INSTRUCTIONS  What family and friends can do:  To find out if someone is confused, ask the person to state his or her name, age, and the date. If the person is unsure or answers incorrectly, he or she is confused.  Always introduce yourself, no matter how well the person knows you.  Often remind the person of his or her location.  Place a calendar and clock near the confused person.  Help the person with his or her medicines. You may want to use a pill box, an alarm as a reminder, or give the person each dose as prescribed.  Talk about current events and plans for the day.  Try to keep the environment calm, quiet, and peaceful.  Make sure the person keeps follow-up visits with his or her health care provider. PREVENTION  Ways to prevent confusion:  Avoid alcohol.  Eat a balanced diet.  Get enough sleep.  Take medicine only as directed by your health care provider.  Do not become isolated. Spend time with other people and make plans for your days.  Keep careful watch on your blood sugar levels if you are diabetic. SEEK IMMEDIATE MEDICAL CARE IF:   You develop severe headaches, repeated vomiting, seizures, blackouts, or   slurred speech.  There is increasing confusion, weakness, numbness, restlessness, or personality changes.  You develop a loss of balance, have marked dizziness, feel uncoordinated, or fall.  You have delusions, hallucinations, or develop severe anxiety.  Your family members think you need to be rechecked. Document Released: 05/21/2004 Document Revised: 08/28/2013  Document Reviewed: 05/19/2013 Exit[employer] Patient Information 2015 [employer]. This information is not intended to replace advice given to you by your health care provider. Make sure you discuss any questions you have with your health care provider.  

## 2014-12-22 NOTE — ED Notes (Signed)
Our P.A., Suezanne Jacquet is made aware of ETOH result just phoned by lab.  Pt. Remains in no distress visiting with his son.

## 2014-12-22 NOTE — ED Notes (Signed)
He remains in no distress; and his son is now with him.  He recognizes and calls his son by (his correct) name.  His speech remains clear.

## 2014-12-22 NOTE — ED Notes (Signed)
EMS were called d/t pt. was seen by observers to be acting "confused" at a local gas/convenience store.  He was seen to be attempting to re=-enter his car without opening the door, etc.  EMS state pt. Was initially flushed (it is ~95 degrees F. Outside at present) and minimally verbal, although he was consistently able to follow all commands.  He arrives here in no distress awake, alert and confused. His speech is intelligible and he answers questions regarding his situation appropriately.  He is also able to correctly state his name and date-of-birth.  He denies any pain or discomfort at this time.  Monitor shows sinus tach. Without ectopy with average heart rate of 105.

## 2014-12-22 NOTE — ED Notes (Signed)
Pt son enter room info me his dad have been drinking

## 2014-12-22 NOTE — ED Provider Notes (Signed)
Physical Exam  BP 143/85 mmHg  Pulse 95  Temp(Src) 98.9 F (37.2 C) (Oral)  Resp 18  SpO2 96%  Physical Exam  Constitutional: He is oriented to person, place, and time. He appears well-developed and well-nourished. No distress.  HENT:  Head: Normocephalic and atraumatic.  Nose: Nose normal.  Mouth/Throat: No oropharyngeal exudate.  Oral mucosa dry, no posterior oropharynx erythema  Eyes: Conjunctivae and EOM are normal. Pupils are equal, round, and reactive to light. Right eye exhibits no discharge. Left eye exhibits no discharge. No scleral icterus.  Neck: Normal range of motion. No JVD present. No tracheal deviation present. No thyromegaly present.  Cardiovascular: Normal rate, regular rhythm, normal heart sounds and intact distal pulses.  Exam reveals no gallop and no friction rub.   No murmur heard. Pulmonary/Chest: Effort normal and breath sounds normal. No respiratory distress. He has no wheezes. He has no rales. He exhibits no tenderness.  Abdominal: Soft. Bowel sounds are normal. He exhibits no distension and no mass. There is no tenderness. There is no rebound and no guarding.  Musculoskeletal: Normal range of motion. He exhibits no edema or tenderness.  Lymphadenopathy:    He has no cervical adenopathy.  Neurological: He is alert and oriented to person, place, and time. He has normal reflexes. No cranial nerve deficit. He exhibits normal muscle tone. Coordination normal.  Patient ambulated with smooth coordination without any loss of balance  Skin: Skin is warm and dry. No rash noted. He is not diaphoretic. No erythema. No pallor.  Psychiatric: He has a normal mood and affect. His behavior is normal. Judgment and thought content normal.  Nursing note and vitals reviewed.   ED Course  Procedures  MDM Patient was received from [facility], PA-C at shift change.  He presented with altered mental status and alcohol intoxication.  His blood alcohol level was 309, labs  pertinent for mildly increased creatinine from his baseline, an anion gap of 16.  His altered mental status has been worked up multiple times in the past year, with negative work-ups from neurology, including MRI, EEG, head ct, carotid duplex, without any acute pathology revealed.   He was reportedly here with his son, who were observed arguing.   When I went to meet the pt, he was trying to leave the ER, his son had left, and there was a friend of the family in the room, attempting to persuade him to stay.  I informed him that he needed IV fluids we would repeat some lab work and then we anticipate discharged home today.  He repeatedly asked how long it would take, and I told him that if he would lay in the gurney and allow to fluids to infuse, he would be able to leave in a few hours, pending improvement of his blood work.  I also cautioned him that if he did not stay for hydration, that he may become more ill.   4:44 PM the pt's son is now back at bedside and pt is laying in the ER gurney, with fluids running. The family and the patient were again informed about the plan and all questions were answered at the time.  6:36 PM - the patient finished his 2 L normal saline bolus of, BMP was repeated, creatinine was normalized as well as his anion gap which is now 12.  Patient has multiple family members at bedside which will care for him as he continues to sober up. He has been observed to ambulate in  the ER without difficulty and with steady gait, he also has been conversing without confusion, slurred speech.  He was discharged home with his son, and was advised to follow-up with his PCP within the next week.       Delsa Grana, PA-C 12/22/14 1839  Forde Dandy, MD 12/23/14 [phone]

## 2014-12-22 NOTE — ED Notes (Signed)
He is awake and more alert and oriented and is visiting with his sister.  He ambulates without difficulty.

## 2014-12-22 NOTE — ED Provider Notes (Signed)
CSN: [id number]     Arrival date & time 12/22/14  1302 History   First MD Initiated Contact with Patient 12/22/14 1351     Chief Complaint  Patient presents with  . Altered Mental Status     (Consider location/radiation/quality/duration/timing/severity/associated sxs/prior Treatment) HPI Jeffrey Acosta is a 64 y.o. male with a history of bipolar, alcoholism, recurrent episodes of confusion with evaluation from neurology, comes in for acute confusion. History is obtained via son in the room as well as nursing note. Per nursing note, patient was seen by observers appearing to be "confused at a gas station trying to reenter his car without opening the door". EMS reports patient was initially flushed, minimally verbal although able to consistently follow commands. He arrived to the ED in no apparent distress. He remains slightly confused although his speech is intelligible and he answers questions appropriately. Son reports the patient had been on lithium for many years and ultimately discontinued his medication 9 years ago due to lithium toxicity. Also reports that patient is a chronic alcoholic who had been sober for 18 years, but relapsed and has been drinking over the past 9 years. Son reports the patient is confusion now due to alcohol as is frequently the case. Patient reports remembering getting up this morning, but does not ever any other events today other than "drinking a lot of alcohol". Per review of previous notes, patient's neurologist, Dr. Jaynee Eagles with [facility] neurology, saw the patient in June and recommended patient not drive until he is asymptomatic for 6 months. Patient reports remembering this encounter, reports however that he still drives. He denies any suicidal or homicidal ideations. No auditory or visual hallucinations. Patient also refuses any outpatient alcohol detox resources.  Past Medical History  Diagnosis Date  . 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 Hx of migraines   .  Hydrocele   . Diverticulosis of colon (without mention of hemorrhage)   . GERD (gastroesophageal reflux disease)   . Hiatal hernia   . Bipolar affective disorder   . Osteopenia   . Hypothyroidism   . HYPERLIPIDEMIA 01/10/2009    Qualifier: Diagnosis of  By: Ronnald Ramp CMA, Chemira    NMR Lipoprofile 2011 LDL could not be calculated  Due to TG of 889  (811  / 750 ),  HDL 50 . Father MI @ 16 PGM CVA early 18s; PGF CVA @72    . Melanoma of skin, site unspecified 11/25/2012    07/2011 abdominal  Stage 1 Dr Tandon Lacy [place] Dr Sarajane Jews    Past Surgical History  Procedure Laterality Date  . Colonoscopy  2003    negative ; Dr Sharlett Iles  . Upper gastrointestinal endoscopy  1983    hiatal hernia  . 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 Vasectomy  1979  . Tonsillectomy  1951  . Melanoma excision  07/28/11    Dr Sarajane Jews; stomach and chest  . Inguinal hernia repair  08/07/2011    Procedure: HERNIA REPAIR INGUINAL ADULT;  Surgeon: Rolm Bookbinder, MD;  Location: [facility];  Service: General;  Laterality: Right;   Family History  Problem Relation Age of Onset  . COPD Father   . Heart attack Father 66  . COPD Mother   . Colon cancer Neg Hx   . Diabetes Paternal Grandmother   . Parkinsonism Paternal Grandmother   . Stroke Paternal Grandmother 21  . Stroke Maternal Grandfather 91   Social History  Substance Use Topics  . Smoking status: Never Smoker   . Smokeless tobacco: Never Used  . Alcohol Use: No     Comment: former alcoholic; now in remission    Review of Systems A 10 point review of systems was completed and was negative except for pertinent positives and negatives as mentioned in the history of present illness     Allergies  Omnipaque  Home Medications   Prior to Admission medications   Medication Sig Start Date End Date Taking? Authorizing Provider  allopurinol (ZYLOPRIM) 300 MG tablet Take 300 mg by mouth daily. For gout   Yes Historical Provider, MD   aspirin 81 MG tablet Take 81 mg by mouth daily.   Yes Historical Provider, MD  colchicine 0.6 MG tablet TAKE 2 TABLETS BY MOUTH AT FIRST SIGNS OF GOUT, THEN 1 TABLET IN 1 HOUR IF NO IMPROVEMENT. CONTINUE TO TAKE 1 TABLET DAILY UNTIL RESOLVED.--patient needs office visit before any further refills 10/23/14  Yes Hendricks Limes, MD  divalproex (DEPAKOTE ER) 500 MG 24 hr tablet Take 500 mg by mouth daily.   Yes Historical Provider, MD  levothyroxine (SYNTHROID, LEVOTHROID) 50 MCG tablet Take 1 tablet (50 mcg total) by mouth daily before breakfast. 10/22/14  Yes Colon Branch, MD  QUEtiapine (SEROQUEL) 100 MG tablet Take 400 mg by mouth at bedtime.  12/18/13  Yes Historical Provider, MD  VENTOLIN HFA 108 (90 BASE) MCG/ACT inhaler Inhale 1-2 puffs into the lungs every 6 (six) hours as needed for wheezing.  09/05/14  Yes Historical Provider, MD   BP 143/85 mmHg  Pulse 95  Temp(Src) 98.9 F (37.2 C) (Oral)  Resp 18  SpO2 96% Physical Exam  Constitutional: He is oriented to person, place, and time. He appears well-developed and well-nourished.  HENT:  Head: Normocephalic and atraumatic.  Mouth/Throat: Oropharynx is clear and moist.  Eyes: Conjunctivae are normal. Pupils are equal, round, and reactive to light. Right eye exhibits no discharge. Left eye exhibits no discharge. No scleral icterus.  Neck: Neck supple.  Cardiovascular: Normal rate, regular rhythm and normal heart sounds.   Pulmonary/Chest: Effort normal and breath sounds normal. No respiratory distress. He has no wheezes. He has no rales.  Abdominal: Soft. There is no tenderness.  Musculoskeletal: He exhibits no tenderness.  Neurological: He is alert and oriented to person, place, and time.  Cranial Nerves II-XII grossly intact. Motor and sensation intact. Gait baseline.  Skin: Skin is warm and dry. No rash noted.  Psychiatric: He has a normal mood and affect.  Nursing note and vitals reviewed.   ED Course  Procedures (including  critical care time) Labs Review Labs Reviewed  COMPREHENSIVE METABOLIC PANEL - Abnormal; Notable for the following:    CO2 16 (*)    Creatinine, Ser 1.35 (*)    Calcium 8.8 (*)    ALT 13 (*)    Total Bilirubin 1.4 (*)    GFR calc non Af Amer 53 (*)    Anion gap 16 (*)    All other components within normal limits  CBC - Abnormal; Notable for the following:    WBC 3.2 (*)    RBC 3.70 (*)    HCT 37.2 (*)    MCV 100.5 (*)    MCH 35.4 (*)    RDW 21.5 (*)    All other components within normal limits  URINALYSIS, ROUTINE W REFLEX MICROSCOPIC (NOT AT [facility]) - Abnormal; Notable for the following:    APPearance CLOUDY (*)  Hgb urine dipstick TRACE (*)    All other components within normal limits  ETHANOL - Abnormal; Notable for the following:    Alcohol, Ethyl (B) 309 (*)    All other components within normal limits  CBG MONITORING, ED - Abnormal; Notable for the following:    Glucose-Capillary 104 (*)    All other components within normal limits  URINE RAPID DRUG SCREEN, HOSP PERFORMED  URINE MICROSCOPIC-ADD ON  CBG MONITORING, ED    Imaging Review No results found. I have personally reviewed and evaluated these images and lab results as part of my medical decision-making.   EKG Interpretation   Date/Time:  Saturday December 22 2014 13:16:20 EDT Ventricular Rate:  101 PR Interval:  207 QRS Duration: 86 QT Interval:  345 QTC Calculation: 447 R Axis:   -44 Text Interpretation:  Sinus tachycardia Borderline prolonged PR interval  Left anterior fascicular block Left axis deviation No significant change  since last tracing Confirmed by Ashok Cordia  MD, Lennette Bihari ([id number]) on 12/22/2014  2:03:26 PM     Meds given in ED:  Medications  sodium chloride 0.9 % bolus 2,000 mL (not administered)    New Prescriptions   No medications on file   Filed Vitals:   12/22/14 1311 12/22/14 1539  BP: 136/79 143/85  Pulse: 104 95  Temp: 98.9 F (37.2 C)   TempSrc: Oral   Resp: 26 18  SpO2:  96% 96%    MDM  Vitals stable - WNL -afebrile Pt resting comfortably in ED. PE--physical exam as above and not concerning. Labwork--EtOH 309. Anion gap 16. Labs otherwise noncontributory and baseline for patient.  DDX--patient has had extensive workup in the past for mental confusion episodes, including neurology. Patient is followed by Dr. Jaynee Eagles at [facility] neurology. Patient has been advised not to drive until symptom-free for 6 months, this recommendation made in June. No evidence of acute or emergent cause of mental confusion today. Confusion likely secondary to acute on chronic alcoholism. Plan is to rehydrate with 2 L normal saline, recheck BMP to ensure gap is closing. If no new objective findings, Patient stable for discharge. Patient care signed out to oncoming provider, Delsa Grana, PA-C. Prior to pt signout, I discussed and reviewed this case with my attending, Dr. Ashok Cordia who agrees with above plan.  Final diagnoses:  Mental confusion  Alcohol abuse       Comer Locket, PA-C 12/22/14 [place], MD 12/23/14 [phone]

## 2014-12-22 NOTE — ED Notes (Signed)
Pt refused w/c, ambulatory upon d/c with steady gait. Accompanied by SO.

## 2015-02-28 ENCOUNTER — Encounter: Payer: Medicare Other | Admitting: Internal Medicine

## 2015-03-08 ENCOUNTER — Other Ambulatory Visit: Payer: Self-pay | Admitting: Internal Medicine

## 2015-03-12 ENCOUNTER — Telehealth: Payer: Self-pay | Admitting: Internal Medicine

## 2015-03-12 NOTE — Telephone Encounter (Signed)
Okay to put 2-15 minute appts together. Pt's mother Nello Prokop was our Pt as well.

## 2015-03-12 NOTE — Telephone Encounter (Signed)
Relation to [id number] Call back number: [phone]   Reason for call:  Patient states mother passed away therefore he had to cancel his physical appointment for 02/28/15 and patient would like to have his physical before January. Please advise

## 2015-03-12 NOTE — Telephone Encounter (Signed)
Patient scheduled physical appointment for 03/20/2015 at 11am

## 2015-03-19 ENCOUNTER — Telehealth: Payer: Self-pay

## 2015-03-19 NOTE — Telephone Encounter (Signed)
Pre Visit Call Completed. 

## 2015-03-20 ENCOUNTER — Ambulatory Visit (INDEPENDENT_AMBULATORY_CARE_PROVIDER_SITE_OTHER): Payer: Medicare Other | Admitting: Internal Medicine

## 2015-03-20 ENCOUNTER — Encounter: Payer: Self-pay | Admitting: Internal Medicine

## 2015-03-20 VITALS — BP 124/76 | HR 64 | Temp 97.9°F | Ht 73.0 in | Wt 210.0 lb

## 2015-03-20 DIAGNOSIS — E039 Hypothyroidism, unspecified: Secondary | ICD-10-CM

## 2015-03-20 DIAGNOSIS — Z09 Encounter for follow-up examination after completed treatment for conditions other than malignant neoplasm: Secondary | ICD-10-CM

## 2015-03-20 DIAGNOSIS — M8580 Other specified disorders of bone density and structure, unspecified site: Secondary | ICD-10-CM | POA: Diagnosis not present

## 2015-03-20 DIAGNOSIS — E785 Hyperlipidemia, unspecified: Secondary | ICD-10-CM | POA: Diagnosis not present

## 2015-03-20 DIAGNOSIS — R399 Unspecified symptoms and signs involving the genitourinary system: Secondary | ICD-10-CM | POA: Diagnosis not present

## 2015-03-20 DIAGNOSIS — E559 Vitamin D deficiency, unspecified: Secondary | ICD-10-CM | POA: Diagnosis not present

## 2015-03-20 DIAGNOSIS — Z0000 Encounter for general adult medical examination without abnormal findings: Secondary | ICD-10-CM | POA: Insufficient documentation

## 2015-03-20 DIAGNOSIS — Z23 Encounter for immunization: Secondary | ICD-10-CM

## 2015-03-20 DIAGNOSIS — Z1159 Encounter for screening for other viral diseases: Secondary | ICD-10-CM

## 2015-03-20 DIAGNOSIS — Z125 Encounter for screening for malignant neoplasm of prostate: Secondary | ICD-10-CM

## 2015-03-20 LAB — BASIC METABOLIC PANEL
BUN: 20 mg/dL (ref 6–23)
CO2: 26 mEq/L (ref 19–32)
Calcium: 9 mg/dL (ref 8.4–10.5)
Chloride: 108 mEq/L (ref 96–112)
Creatinine, Ser: 1.04 mg/dL (ref 0.40–1.50)
GFR: 75.83 mL/min (ref >60.00)
Glucose, Bld: 95 mg/dL (ref 70–99)
Potassium: 4.3 mEq/L (ref 3.5–5.1)
Sodium: 141 mEq/L (ref 135–145)

## 2015-03-20 LAB — CBC WITH DIFFERENTIAL/PLATELET
HCT: 42.9 % (ref 39.0–52.0)
Hemoglobin: 14.2 g/dL (ref 13.0–17.0)
MCHC: 33.2 g/dL (ref 30.0–36.0)
MCV: 98.5 fl (ref 78.0–100.0)
Platelets: 204 10*3/uL (ref 150.0–400.0)
RBC: 4.36 Mil/uL (ref 4.22–5.81)
RDW: 13.3 % (ref 11.5–15.5)
WBC: 5.8 10*3/uL (ref 4.0–10.5)

## 2015-03-20 LAB — TSH: TSH: 0.7 u[IU]/mL (ref 0.35–4.50)

## 2015-03-20 LAB — URINALYSIS, ROUTINE W REFLEX MICROSCOPIC
Bilirubin Urine: NEGATIVE
Ketones, ur: NEGATIVE
Leukocytes, UA: NEGATIVE
Nitrite: NEGATIVE
Specific Gravity, Urine: 1.025 (ref 1.000–1.030)
Total Protein, Urine: NEGATIVE
Urine Glucose: NEGATIVE
Urobilinogen, UA: 0.2 (ref 0.0–1.0)
WBC, UA: NONE SEEN (ref 0–?)
pH: 6 (ref 5.0–8.0)

## 2015-03-20 LAB — ALT: ALT: 10 U/L (ref 0–53)

## 2015-03-20 LAB — LIPID PANEL
Cholesterol: 183 mg/dL (ref 0–200)
HDL: 51.1 mg/dL (ref >39.00)
LDL Cholesterol: 113 mg/dL — ABNORMAL HIGH (ref 0–99)
NonHDL: 131.69
Total CHOL/HDL Ratio: 4
Triglycerides: 95 mg/dL (ref 0.0–149.0)
VLDL: 19 mg/dL (ref 0.0–40.0)

## 2015-03-20 LAB — AST: AST: 16 U/L (ref 0–37)

## 2015-03-20 LAB — VITAMIN D 25 HYDROXY (VIT D DEFICIENCY, FRACTURES): VITD: 12.15 ng/mL — ABNORMAL LOW (ref 30.00–100.00)

## 2015-03-20 LAB — PSA: PSA: 0.91 ng/mL (ref 0.10–4.00)

## 2015-03-20 MED ORDER — ZOSTER VACCINE LIVE 19400 UNT/0.65ML ~~LOC~~ SOLR: 0.6500 mL | Freq: Once | SUBCUTANEOUS | Status: DC

## 2015-03-20 NOTE — Progress Notes (Signed)
Pre visit review using our clinic review tool, if applicable. No additional management support is needed unless otherwise documented below in the visit note. 

## 2015-03-20 NOTE — Patient Instructions (Signed)
Get your blood work before you leave     Next visit  for a   routine checkup in 6 months, no fasting (15 minutes) Please schedule an appointment at the front desk    Please schedule a visit with Jeffrey Acosta for  a Medicare yearly checkup

## 2015-03-20 NOTE — Assessment & Plan Note (Addendum)
Tdap--Due 2014 prevnar-- today PNA shot 23-- later  Flu shot today zostavax-- rx provided   CCS: Last cscope  2015. Some Diverticulosis , Dr Henrene Pastor, 10 years  Prostate cancer screening: DRE normal, check a PSA. He has some symptoms. See assessment and plan. Diet exercise discussed  Labs ===BMP, AST, ALT, FLP, CBC, TSH, PSA, hec C

## 2015-03-20 NOTE — Progress Notes (Signed)
Subjective:    Patient ID: Jeffrey Acosta, male    DOB: 1950/05/24, 64 y.o.   MRN: [id number]  DOS:  03/20/2015 Type of visit - description : CPX Interval history: Since the last time she was here, he lost his mother. She was my patient, condolences provided. Besides  his lost, he is feeling overall  better since he stopped drinking  and is off lithium.   Review of Systems  Constitutional: No fever. No chills. No unexplained wt changes. No unusual sweats  HEENT: No dental problems, no ear discharge, no facial swelling, no voice changes. No eye discharge, no eye  redness , no  intolerance to light   Respiratory: No wheezing , no  difficulty breathing. No cough , no mucus production  Cardiovascular: No CP, occasionally ankle swelling at the end of the day , no  Palpitations  GI: no nausea, no vomiting, no diarrhea , no  abdominal pain.  No blood in the stools. No dysphagia, no odynophagia    Endocrine: No polyphagia, no polyuria , no polydipsia  GU: Episodic periods of increased frequency and difficulty urinating. No dysuria, gross hematuria.  Musculoskeletal: No joint swellings or unusual aches or pains  Skin: No change in the color of the skin, palor , no  Rash  Allergic, immunologic: No environmental allergies , no  food allergies  Neurological: No dizziness no  syncope. No headaches. No diplopia, no slurred, no slurred speech, no motor deficits, no facial  Numbness  Hematological: No enlarged lymph nodes, no easy bruising , no unusual bleedings  Psychiatry: No suicidal ideas, no hallucinations, no beavior problems, no confusion.  No unusual/severe anxiety, no depression  Past Medical History  Diagnosis Date  . Alcoholism in remission ([place])     in [place]  . Hx of migraines   . Hydrocele   . Diverticulosis of colon (without mention of hemorrhage)   . GERD (gastroesophageal reflux disease)   . Hiatal hernia   . Bipolar affective disorder ([place])   . Osteopenia   .  Hypothyroidism   . HYPERLIPIDEMIA 01/10/2009    Qualifier: Diagnosis of  By: Ronnald Ramp CMA, Chemira    NMR Lipoprofile 2011 LDL could not be calculated  Due to TG of 889  (811  / 750 ),  HDL 50 . Father MI @ 75 PGM CVA early 72s; PGF CVA @72    . Melanoma of skin, site unspecified 11/25/2012    07/2011 abdominal  Stage 1 Dr Noh Lacy [place] Dr Sarajane Jews     Past Surgical History  Procedure Laterality Date  . Colonoscopy  2003    negative ; Dr Sharlett Iles  . Upper gastrointestinal endoscopy  1983    hiatal hernia  . 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 Vasectomy  1979  . Tonsillectomy  1951  . Melanoma excision  07/28/11    Dr Sarajane Jews; stomach and chest  . Inguinal hernia repair  08/07/2011    Procedure: HERNIA REPAIR INGUINAL ADULT;  Surgeon: Rolm Bookbinder, MD;  Location: [facility];  Service: General;  Laterality: Right;    Social History   Social History  . Marital Status: Married    Spouse Name: [name]  . Number of Children: 1  . Years of Education: N/A   Occupational History  . retired    Social History Main Topics  . Smoking status: Never Smoker   . Smokeless tobacco: Never Used  . Alcohol Use: No  Comment: former alcoholic; now in remission, last drink ~ 01-01-15  . Drug Use: No  . Sexual Activity: Not on file   Other Topics Concern  . Not on file   Social History Narrative   1 child, 2 step children   Household- pt, wife    Leo Rod Megargel (sister)     Family History  Problem Relation Age of Onset  . COPD Father   . Heart attack Father 72  . COPD Mother   . Colon cancer Neg Hx   . Diabetes Paternal Grandmother   . Parkinsonism Paternal Grandmother   . Stroke Paternal Grandmother 42  . Stroke Maternal Grandfather 72  . Prostate cancer Neg Hx        Medication List       This list is accurate as of: 03/20/15 11:59 PM.  Always use your most recent med list.               allopurinol 300 MG tablet   Commonly known as:  ZYLOPRIM  Take 300 mg by mouth daily. For gout     aspirin 81 MG tablet  Take 81 mg by mouth daily.     colchicine 0.6 MG tablet  TAKE 2 TABLETS BY MOUTH AT FIRST SIGNS OF GOUT, THEN 1 TABLET IN 1 HOUR IF NO IMPROVEMENT. CONTINUE TO TAKE 1 TABLET DAILY UNTIL RESOLVED.--patient needs office visit before any further refills     divalproex 500 MG 24 hr tablet  Commonly known as:  DEPAKOTE ER  Take 500 mg by mouth daily.     lamoTRIgine 100 MG tablet  Commonly known as:  LAMICTAL  Take 100 mg by mouth daily.     levothyroxine 50 MCG tablet  Commonly known as:  SYNTHROID, LEVOTHROID  Take 1 tablet (50 mcg total) by mouth daily before breakfast.     OLANZapine-FLUoxetine 3-25 MG capsule  Commonly known as:  SYMBYAX  Take 1 capsule by mouth daily.     QUEtiapine 100 MG tablet  Commonly known as:  SEROQUEL  Take 400 mg by mouth at bedtime.     VENTOLIN HFA 108 (90 BASE) MCG/ACT inhaler  Generic drug:  albuterol  Inhale 1-2 puffs into the lungs every 6 (six) hours as needed for wheezing.     zoster vaccine live (PF) 19400 UNT/0.65ML injection  Commonly known as:  ZOSTAVAX  Inject 19,400 Units into the skin once.           Objective:   Physical Exam BP 124/76 mmHg  Pulse 64  Temp(Src) 97.9 F (36.6 C) (Oral)  Ht 6\' 1"  (1.854 m)  Wt 210 lb (95.255 kg)  BMI 27.71 kg/m2  SpO2 96% General:   Well developed, well nourished . NAD.  Neck:  . No  thyromegaly , normal carotid pulse HEENT:  Normocephalic . Face symmetric, atraumatic Lungs:  CTA B Normal respiratory effort, no intercostal retractions, no accessory muscle use. Heart: RRR,  no murmur.  No pretibial edema bilaterally  Abdomen:  Not distended, soft, non-tender. No rebound or rigidity. Rectal:  External abnormalities: none. Normal sphincter tone. No rectal masses or tenderness.  Stool brown  Prostate: Prostate gland firm and smooth, no enlargement, nodularity, tenderness, mass,  asymmetry or induration.  Skin: Exposed areas without rash. Not pale. Not jaundice Neurologic:  alert & oriented X3.  Speech normal, gait appropriate for age and unassisted Strength symmetric and appropriate for age.  Psych: Cognition and judgment appear  intact.  Cooperative with normal attention span and concentration.  Behavior appropriate. No anxious or depressed appearing.    Assessment & Plan:   Assessment> Bipolar disorder-- Dr Toy Care, dc lithium 07-2014 EtOH abuse-- remission ? (+ etoh @ ER visit 12-22-14) Hypothyroidism Hyperlipidemia Gout Melanoma 2014 Dr. Martin[place] Osteopenia  T score -2.4  ( 11-2012) GI: GERD, hiatal hernia, diverticulitis  PLAN Bipolar disorder: See last office visit, had mental status changes, now is doing great without lithium and without drinking. Gout: No recent episodes Osteopenia: Check vitamin D, repeat a bone density test, last was 11-2012. Hypothyroidism: Check labs LUTS: Minimal symptoms, DRE (-), labs--PSA, UA and urine culture. Otherwise observation  RTC 6 months

## 2015-03-22 LAB — URINE CULTURE
Colony Count: NO GROWTH
Organism ID, Bacteria: NO GROWTH

## 2015-03-23 DIAGNOSIS — Z09 Encounter for follow-up examination after completed treatment for conditions other than malignant neoplasm: Secondary | ICD-10-CM | POA: Insufficient documentation

## 2015-03-23 NOTE — Assessment & Plan Note (Signed)
Bipolar disorder: See last office visit, had mental status changes, now is doing great without lithium and without drinking. Gout: No recent episodes Osteopenia: Check vitamin D, repeat a bone density test, last was 11-2012. Hypothyroidism: Check labs LUTS: Minimal symptoms, DRE (-), labs--PSA, UA and urine culture. Otherwise observation  RTC 6 months

## 2015-03-26 ENCOUNTER — Encounter: Payer: Self-pay | Admitting: *Deleted

## 2015-03-26 ENCOUNTER — Other Ambulatory Visit: Payer: Self-pay | Admitting: Internal Medicine

## 2015-03-26 MED ORDER — ERGOCALCIFEROL 1.25 MG (50000 UT) PO CAPS: 50000.0000 [IU] | ORAL_CAPSULE | ORAL | Status: DC

## 2015-04-01 ENCOUNTER — Telehealth: Payer: Self-pay

## 2015-04-01 ENCOUNTER — Ambulatory Visit (INDEPENDENT_AMBULATORY_CARE_PROVIDER_SITE_OTHER): Payer: Medicare Other

## 2015-04-01 VITALS — BP 124/78 | HR 75 | Ht 72.0 in | Wt 213.8 lb

## 2015-04-01 DIAGNOSIS — Z0000 Encounter for general adult medical examination without abnormal findings: Secondary | ICD-10-CM

## 2015-04-01 MED ORDER — SILDENAFIL CITRATE 20 MG PO TABS: 60.0000 mg | ORAL_TABLET | Freq: Three times a day (TID) | ORAL | Status: DC

## 2015-04-01 NOTE — Telephone Encounter (Signed)
Left a message for call back.  

## 2015-04-01 NOTE — Telephone Encounter (Signed)
During AWV this morning, pt mentioned having issues with erectile dysfunction and asked for your recommendation.  He says that he was once on Cialis, but now that he's on Medicare, he would have to pay $400 out of pocket and is unable to afford that.  He was encouraged to call insurance company to see if any ED drugs were covered.  Pt states he still would like Dr. Ethel Rana recommendation.   Please advise.

## 2015-04-01 NOTE — Progress Notes (Addendum)
Subjective:   Jeffrey Acosta is a 64 y.o. male who presents for an Initial Medicare Annual Wellness Visit.  Review of Systems:  No ROS  Cardiac Risk Factors include: advanced age (>60men, >32 women);male gender Sleep patterns:  Sleeps approximately 7 hours per night/sometimes wakes up once during the night to void.   Home Safety/Smoke Alarms:  Feels safe at home.  Lives with wife in 2 story home.  Hardly ever goes upstairs. Smoke alarms present.   Firearm Safety:  Kept in safe place.   Seat Belt Safety/Bike Helmet:  Always wears seat belt.    Counseling:   Eye Exam- 03/27/15  Dr. Marica Otter [facility]-  Goes every 6 months.   Male:  CCS-11/22/13-repeat in 10 years    PSA- 0.91      Objective:    Today's Vitals   04/01/15 0814  BP: 124/78  Pulse: 75  Height: 6' (1.829 m)  Weight: 213 lb 12.8 oz (96.979 kg)  SpO2: 96%  PainSc: 7     Current Medications (verified) Outpatient Encounter Prescriptions as of 04/01/2015  Medication Sig  . allopurinol (ZYLOPRIM) 300 MG tablet Take 300 mg by mouth daily. For gout  . aspirin 81 MG tablet Take 81 mg by mouth daily.  . divalproex (DEPAKOTE ER) 500 MG 24 hr tablet Take 500 mg by mouth daily.  . ergocalciferol (VITAMIN D2) 50000 UNITS capsule Take 1 capsule (50,000 Units total) by mouth once a week.  . lamoTRIgine (LAMICTAL) 100 MG tablet Take 100 mg by mouth daily.  Marland Kitchen levothyroxine (SYNTHROID, LEVOTHROID) 50 MCG tablet Take 1 tablet (50 mcg total) by mouth daily before breakfast.  . OLANZapine-FLUoxetine (SYMBYAX) 3-25 MG capsule Take 1 capsule by mouth daily.  . QUEtiapine (SEROQUEL) 100 MG tablet Take 300 mg by mouth at bedtime.   . zoster vaccine live, PF, (ZOSTAVAX) 96295 UNT/0.65ML injection Inject 19,400 Units into the skin once.  . colchicine 0.6 MG tablet TAKE 2 TABLETS BY MOUTH AT FIRST SIGNS OF GOUT, THEN 1 TABLET IN 1 HOUR IF NO IMPROVEMENT. CONTINUE TO TAKE 1 TABLET DAILY UNTIL RESOLVED.--patient needs office visit before any  further refills (Patient not taking: Reported on 03/20/2015)  . [DISCONTINUED] VENTOLIN HFA 108 (90 BASE) MCG/ACT inhaler Inhale 1-2 puffs into the lungs every 6 (six) hours as needed for wheezing.    No facility-administered encounter medications on file as of 04/01/2015.    Allergies (verified) Omnipaque   History: Past Medical History  Diagnosis Date  . Alcoholism in remission ([place])     in [place]  . Hx of migraines   . Hydrocele   . Diverticulosis of colon (without mention of hemorrhage)   . GERD (gastroesophageal reflux disease)   . Hiatal hernia   . Bipolar affective disorder ([place])   . Osteopenia   . Hypothyroidism   . HYPERLIPIDEMIA 01/10/2009    Qualifier: Diagnosis of  By: Ronnald Ramp CMA, Chemira    NMR Lipoprofile 2011 LDL could not be calculated  Due to TG of 889  (811  / 750 ),  HDL 50 . Father MI @ 45 PGM CVA early 10s; PGF CVA @72    . Melanoma of skin, site unspecified 11/25/2012    07/2011 abdominal  Stage 1 Dr Amy [place] Dr Sarajane Jews    Past Surgical History  Procedure Laterality Date  . Colonoscopy  2003    negative ; Dr Sharlett Iles  . Upper gastrointestinal endoscopy  1983    hiatal hernia  . 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 Vasectomy  1979  . Tonsillectomy  1951  . Melanoma excision  07/28/11    Dr Sarajane Jews; stomach and chest  . Inguinal hernia repair  08/07/2011    Procedure: HERNIA REPAIR INGUINAL ADULT;  Surgeon: Rolm Bookbinder, MD;  Location: [facility];  Service: General;  Laterality: Right;   Family History  Problem Relation Age of Onset  . COPD Father   . Heart attack Father 57  . COPD Mother   . Colon cancer Neg Hx   . Diabetes Paternal Grandmother   . Parkinsonism Paternal Grandmother   . Stroke Paternal Grandmother 14  . Stroke Maternal Grandfather 72  . Prostate cancer Neg Hx   . Lung cancer Paternal Grandfather     Heavy smoker   Social History   Occupational History  . retired    Social History Main Topics  .  Smoking status: Never Smoker   . Smokeless tobacco: Never Used  . Alcohol Use: No     Comment: former alcoholic; now in remission, last drink ~ 01-01-15  . Drug Use: No  . Sexual Activity:    Partners: Female   Tobacco Counseling Counseling given: Not Answered   Activities of Daily Living In your present state of health, do you have any difficulty performing the following activities: 04/01/2015 03/20/2015  Hearing? N N  Vision? N N  Difficulty concentrating or making decisions? N Y  Walking or climbing stairs? Y N  Dressing or bathing? N N  Doing errands, shopping? N N  Preparing Food and eating ? N -  Using the Toilet? N -  In the past six months, have you accidently leaked urine? N -  Do you have problems with loss of bowel control? N -  Managing your Medications? N -  Managing your Finances? N -  Housekeeping or managing your Housekeeping? Y -    Immunizations and Health Maintenance Immunization History  Administered Date(s) Administered  . Influenza Split 02/02/2012  . Influenza Whole 03/17/2007, 02/08/2008, 02/07/2009, 05/20/2010  . Influenza, High Dose Seasonal PF 03/20/2015  . Influenza,inj,Quad PF,36+ Mos 01/08/2014  . Pneumococcal Conjugate-13 03/20/2015  . Pneumococcal Polysaccharide-23 02/17/2008  . Tdap 11/29/2012   Health Maintenance Due  Topic Date Due  . Hepatitis C Screening  1950/05/01    Patient Care Team: Colon Branch, MD as PCP - General (Internal Medicine) Chucky May, MD as Consulting Physician (Psychiatry) Kathrynn Ducking, MD as Consulting Physician (Neurology) Melvenia Beam, MD as Consulting Physician (Neurology) Gypsy Lore, PT as Physical Therapist (Physical Therapy) Ludwig Clarks, DO as Consulting Physician (Neurology) Marica Otter, OD as Consulting Physician (Optometry) Irene Shipper, MD as Consulting Physician (Gastroenterology) Demetrius Revel, DDS as Consulting Physician (Dentistry)  Indicate any recent Medical Services you may  have received from other than [facility] providers in the past year (date may be approximate).    Assessment:   This is a routine wellness examination for Jeffrey Acosta.  Hearing/Vision screen  Hearing Screening   125Hz  250Hz  500Hz  1000Hz  2000Hz  4000Hz  8000Hz   Right ear:   Pass Pass Pass Fail   Left ear:   Pass Fail Pass Fail   Comments: Hearing has diminished.    Vision Screening Comments: Last eye exam: 03/27/15- Dr. Sabra Heck    Dietary issues and exercise activities discussed: Current Exercise Habits:: The patient does not participate in regular exercise at present (Exercises sporadically )   Diet:  Wife cooks, but eats some  meals out (fast food).  Eats healthy portions.  Craves sugar all the time--eats too many sweets.    Goals    . Improve diet:  Less meat (3 meats per week), less sweets (3- 4 servings per week) and more fruit (fruit to replace desserts).      . Increase physical activity     Primarily walking on a regular basis.        Depression Screen PHQ 2/9 Scores 04/01/2015 03/20/2015 07/26/2014 11/25/2012  PHQ - 2 Score 0 0 0 0    Fall Risk Fall Risk  04/01/2015 03/20/2015 07/26/2014 07/26/2014 11/25/2012  Falls in the past year? Yes No Yes Yes No  Number falls in past yr: 2 or more - 2 or more 2 or more -  Injury with Fall? No - No No -  Risk for fall due to : Impaired balance/gait;Medication side effect - - - -  Risk for fall due to (comments): At risk for falls due to lower back pain and left leg pain.   - - - -  Follow up Education provided;Falls prevention discussed - - - -    Cognitive Function: AD8 Screening completed:  Score- 1/8.  Normal   Screening Tests Health Maintenance  Topic Date Due  . Hepatitis C Screening  01-29-1951  . ZOSTAVAX  03/19/2016 (Originally 09/24/2008)  . INFLUENZA VACCINE  11/26/2015  . PNA vac Low Risk Adult (2 of 2 - PPSV23) 03/19/2016  . TETANUS/TDAP  11/30/2022  . COLONOSCOPY  11/23/2023      Plan:  Eat healthy diet (full of fruits,  vegetables, whole grains, lean protein, water--limit salt, fat, and sugar intake) and increase physical activity as tolerated.  Follow up with Dr. Larose Kells as scheduled.     During the course of the visit Chaison was educated and counseled about the following appropriate screening and preventive services:   Vaccines to include Pneumoccal, Influenza, Hepatitis B, Td, Zostavax, HCV  Electrocardiogram  Colorectal cancer screening  Cardiovascular disease screening  Diabetes screening  Glaucoma screening  Nutrition counseling  Prostate cancer screening  Smoking cessation counseling  Patient Instructions (the written plan) were given to the patient.   Rudene Anda, RN   04/01/2015   Agree J PAZ M.D.

## 2015-04-01 NOTE — Patient Instructions (Addendum)
Eat healthy diet (full of fruits, vegetables, whole grains, lean protein, water--limit salt, fat, and sugar intake) and increase physical activity as tolerated.  Follow up with Dr. Larose Kells as scheduled.     Fat and Cholesterol Restricted Diet High levels of fat and cholesterol in your blood may lead to various health problems, such as diseases of the heart, blood vessels, gallbladder, liver, and pancreas. Fats are concentrated sources of energy that come in various forms. Certain types of fat, including saturated fat, may be harmful in excess. Cholesterol is a substance needed by your body in small amounts. Your body makes all the cholesterol it needs. Excess cholesterol comes from the food you eat. When you have high levels of cholesterol and saturated fat in your blood, health problems can develop because the excess fat and cholesterol will gather along the walls of your blood vessels, causing them to narrow. Choosing the right foods will help you control your intake of fat and cholesterol. This will help keep the levels of these substances in your blood within normal limits and reduce your risk of disease. WHAT IS MY PLAN? Your health care provider recommends that you:  Get no more than __________ % of the total calories in your daily diet from fat.  Limit your intake of saturated fat to less than ______% of your total calories each day.  Limit the amount of cholesterol in your diet to less than _________mg per day. WHAT TYPES OF FAT SHOULD I CHOOSE?  Choose healthy fats more often. Choose monounsaturated and polyunsaturated fats, such as olive and canola oil, flaxseeds, walnuts, almonds, and seeds.  Eat more omega-3 fats. Good choices include salmon, mackerel, sardines, tuna, flaxseed oil, and ground flaxseeds. Aim to eat fish at least two times a week.  Limit saturated fats. Saturated fats are primarily found in animal products, such as meats, butter, and cream. Plant sources of saturated fats  include palm oil, palm kernel oil, and coconut oil.  Avoid foods with partially hydrogenated oils in them. These contain trans fats. Examples of foods that contain trans fats are stick margarine, some tub margarines, cookies, crackers, and other baked goods. WHAT GENERAL GUIDELINES DO I NEED TO FOLLOW? These guidelines for healthy eating will help you control your intake of fat and cholesterol:  Check food labels carefully to identify foods with trans fats or high amounts of saturated fat.  Fill one half of your plate with vegetables and green salads.  Fill one fourth of your plate with whole grains. Look for the word "whole" as the first word in the ingredient list.  Fill one fourth of your plate with lean protein foods.  Limit fruit to two servings a day. Choose fruit instead of juice.  Eat more foods that contain soluble fiber. Examples of foods that contain this type of fiber are apples, broccoli, carrots, beans, peas, and barley. Aim to get 20-30 g of fiber per day.  Eat more home-cooked food and less restaurant, buffet, and fast food.  Limit or avoid alcohol.  Limit foods high in starch and sugar.  Limit fried foods.  Cook foods using methods other than frying. Baking, boiling, grilling, and broiling are all great options.  Lose weight if you are overweight. Losing just 5-10% of your initial body weight can help your overall health and prevent diseases such as diabetes and heart disease. WHAT FOODS CAN I EAT? Grains Whole grains, such as whole wheat or whole grain breads, crackers, cereals, and pasta. Unsweetened oatmeal, bulgur,  barley, quinoa, or brown rice. Corn or whole wheat flour tortillas. Vegetables Fresh or frozen vegetables (raw, steamed, roasted, or grilled). Green salads. Fruits All fresh, canned (in natural juice), or frozen fruits. Meat and Other Protein Products Ground beef (85% or leaner), grass-fed beef, or beef trimmed of fat. Skinless chicken or [place].  Ground chicken or [place]. Pork trimmed of fat. All fish and seafood. Eggs. Dried beans, peas, or lentils. Unsalted nuts or seeds. Unsalted canned or dry beans. Dairy Low-fat dairy products, such as skim or 1% milk, 2% or reduced-fat cheeses, low-fat ricotta or cottage cheese, or plain low-fat yogurt. Fats and Oils Tub margarines without trans fats. Light or reduced-fat mayonnaise and salad dressings. Avocado. Olive, canola, sesame, or safflower oils. Natural peanut or almond butter (choose ones without added sugar and oil). The items listed above may not be a complete list of recommended foods or beverages. Contact your dietitian for more options. WHAT FOODS ARE NOT RECOMMENDED? Grains White bread. White pasta. White rice. Cornbread. Bagels, pastries, and croissants. Crackers that contain trans fat. Vegetables White potatoes. Corn. Creamed or fried vegetables. Vegetables in a cheese sauce. Fruits Dried fruits. Canned fruit in light or heavy syrup. Fruit juice. Meat and Other Protein Products Fatty cuts of meat. Ribs, chicken wings, bacon, sausage, bologna, salami, chitterlings, fatback, hot dogs, bratwurst, and packaged luncheon meats. Liver and organ meats. Dairy Whole or 2% milk, cream, half-and-half, and cream cheese. Whole milk cheeses. Whole-fat or sweetened yogurt. Full-fat cheeses. Nondairy creamers and whipped toppings. Processed cheese, cheese spreads, or cheese curds. Sweets and Desserts Corn syrup, sugars, honey, and molasses. Candy. Jam and jelly. Syrup. Sweetened cereals. Cookies, pies, cakes, donuts, muffins, and ice cream. Fats and Oils Butter, stick margarine, lard, shortening, ghee, or bacon fat. Coconut, palm kernel, or palm oils. Beverages Alcohol. Sweetened drinks (such as sodas, lemonade, and fruit drinks or punches). The items listed above may not be a complete list of foods and beverages to avoid. Contact your dietitian for more information.   This information is not  intended to replace advice given to you by your health care provider. Make sure you discuss any questions you have with your health care provider.   Document Released: 04/13/2005 Document Revised: 05/04/2014 Document Reviewed: 07/12/2013 Elsevier Interactive Patient Education 2016 [place] in the Home  Falls can cause injuries. They can happen to people of all ages. There are many things you can do to make your home safe and to help prevent falls.  WHAT CAN I DO ON THE OUTSIDE OF MY HOME?  Regularly fix the edges of walkways and driveways and fix any cracks.  Remove anything that might make you trip as you walk through a door, such as a raised step or threshold.  Trim any bushes or trees on the path to your home.  Use bright outdoor lighting.  Clear any walking paths of anything that might make someone trip, such as rocks or tools.  Regularly check to see if handrails are loose or broken. Make sure that both sides of any steps have handrails.  Any raised decks and porches should have guardrails on the edges.  Have any leaves, snow, or ice cleared regularly.  Use sand or salt on walking paths during winter.  Clean up any spills in your garage right away. This includes oil or grease spills. WHAT CAN I DO IN THE BATHROOM?   Use night lights.  Install grab bars by the toilet and in the tub  and shower. Do not use towel bars as grab bars.  Use non-skid mats or decals in the tub or shower.  If you need to sit down in the shower, use a plastic, non-slip stool.  Keep the floor dry. Clean up any water that spills on the floor as soon as it happens.  Remove soap buildup in the tub or shower regularly.  Attach bath mats securely with double-sided non-slip rug tape.  Do not have throw rugs and other things on the floor that can make you trip. WHAT CAN I DO IN THE BEDROOM?  Use night lights.  Make sure that you have a light by your bed that is easy to  reach.  Do not use any sheets or blankets that are too big for your bed. They should not hang down onto the floor.  Have a firm chair that has side arms. You can use this for support while you get dressed.  Do not have throw rugs and other things on the floor that can make you trip. WHAT CAN I DO IN THE KITCHEN?  Clean up any spills right away.  Avoid walking on wet floors.  Keep items that you use a lot in easy-to-reach places.  If you need to reach something above you, use a strong step stool that has a grab bar.  Keep electrical cords out of the way.  Do not use floor polish or wax that makes floors slippery. If you must use wax, use non-skid floor wax.  Do not have throw rugs and other things on the floor that can make you trip. WHAT CAN I DO WITH MY STAIRS?  Do not leave any items on the stairs.  Make sure that there are handrails on both sides of the stairs and use them. Fix handrails that are broken or loose. Make sure that handrails are as long as the stairways.  Check any carpeting to make sure that it is firmly attached to the stairs. Fix any carpet that is loose or worn.  Avoid having throw rugs at the top or bottom of the stairs. If you do have throw rugs, attach them to the floor with carpet tape.  Make sure that you have a light switch at the top of the stairs and the bottom of the stairs. If you do not have them, ask someone to add them for you. WHAT ELSE CAN I DO TO HELP PREVENT FALLS?  Wear shoes that:  Do not have high heels.  Have rubber bottoms.  Are comfortable and fit you well.  Are closed at the toe. Do not wear sandals.  If you use a stepladder:  Make sure that it is fully opened. Do not climb a closed stepladder.  Make sure that both sides of the stepladder are locked into place.  Ask someone to hold it for you, if possible.  Clearly mark and make sure that you can see:  Any grab bars or handrails.  First and last steps.  Where the  edge of each step is.  Use tools that help you move around (mobility aids) if they are needed. These include:  Canes.  Walkers.  Scooters.  Crutches.  Turn on the lights when you go into a dark area. Replace any light bulbs as soon as they burn out.  Set up your furniture so you have a clear path. Avoid moving your furniture around.  If any of your floors are uneven, fix them.  If there are any pets around  you, be aware of where they are.  Review your medicines with your doctor. Some medicines can make you feel dizzy. This can increase your chance of falling. Ask your doctor what other things that you can do to help prevent falls.   This information is not intended to replace advice given to you by your health care provider. Make sure you discuss any questions you have with your health care provider.   Document Released: 02/07/2009 Document Revised: 08/28/2014 Document Reviewed: 05/18/2014 Elsevier Interactive Patient Education [employer].

## 2015-04-01 NOTE — Progress Notes (Signed)
Pre visit review using our clinic review tool, if applicable. No additional management support is needed unless otherwise documented below in the visit note. 

## 2015-04-01 NOTE — Telephone Encounter (Signed)
Advise patient, I sent a prescription for sildenafil, a generic Viagra. Side effect profile is similar to Cialis. He can take 2 or 3 tablets (together) daily as needed.

## 2015-04-02 NOTE — Telephone Encounter (Signed)
Patient returning your call best [phone]

## 2015-04-02 NOTE — Telephone Encounter (Signed)
Spoke to patient made him aware.  He stated understanding and agreed to call back with further questions or concerns.

## 2015-04-03 ENCOUNTER — Telehealth: Payer: Self-pay | Admitting: *Deleted

## 2015-04-03 NOTE — Telephone Encounter (Signed)
PA initiated, awaiting determination. JG//CMA  

## 2015-04-09 NOTE — Telephone Encounter (Signed)
Received Denial on PA for Sildenafil Citrate 20 mg from [facility] [place]; Please Advise on how you would like to proceed/SLS Thanks.

## 2015-04-09 NOTE — Telephone Encounter (Addendum)
Needs to buy out of pocket, recommend to try [facility].  Re- print a prescription if necessary

## 2015-04-10 MED ORDER — SILDENAFIL CITRATE 20 MG PO TABS: 60.0000 mg | ORAL_TABLET | Freq: Every day | ORAL | Status: DC

## 2015-04-10 NOTE — Addendum Note (Signed)
Addended by: Rockwell Germany on: 04/10/2015 10:57 AM   Modules accepted: Orders

## 2015-04-10 NOTE — Telephone Encounter (Signed)
Spoke with patient RE: provider instructions and patient requested to have new printed script to "price match" at pharmacies; informed pt that [facility] in [place] is usually the cheapest price on this drug that we have found for our patients, pt will call this pharmacy; Rx at front desk, available for p/u during regular business hours/SLS

## 2015-04-10 NOTE — Telephone Encounter (Signed)
LMOM with contact name and number for return call RE: Medication PA and further provider instructions/SLS

## 2015-05-15 ENCOUNTER — Other Ambulatory Visit: Payer: Self-pay | Admitting: Internal Medicine

## 2015-06-03 DIAGNOSIS — L918 Other hypertrophic disorders of the skin: Secondary | ICD-10-CM | POA: Diagnosis not present

## 2015-06-03 DIAGNOSIS — Z85820 Personal history of malignant melanoma of skin: Secondary | ICD-10-CM | POA: Diagnosis not present

## 2015-06-03 DIAGNOSIS — D225 Melanocytic nevi of trunk: Secondary | ICD-10-CM | POA: Diagnosis not present

## 2015-06-03 DIAGNOSIS — L821 Other seborrheic keratosis: Secondary | ICD-10-CM | POA: Diagnosis not present

## 2015-06-04 DIAGNOSIS — H40013 Open angle with borderline findings, low risk, bilateral: Secondary | ICD-10-CM | POA: Diagnosis not present

## 2015-07-17 DIAGNOSIS — M1A09X0 Idiopathic chronic gout, multiple sites, without tophus (tophi): Secondary | ICD-10-CM | POA: Diagnosis not present

## 2015-07-17 DIAGNOSIS — Z79899 Other long term (current) drug therapy: Secondary | ICD-10-CM | POA: Diagnosis not present

## 2015-07-17 DIAGNOSIS — M2550 Pain in unspecified joint: Secondary | ICD-10-CM | POA: Diagnosis not present

## 2015-07-17 LAB — BASIC METABOLIC PANEL
BUN: 9 mg/dL (ref 4–21)
Creatinine: 1 mg/dL (ref 0.6–1.3)
Glucose: 95 mg/dL
Potassium: 3.5 mmol/L (ref 3.4–5.3)
Sodium: 140 mmol/L (ref 137–147)

## 2015-07-17 LAB — HEPATIC FUNCTION PANEL
ALT: 11 U/L (ref 10–40)
AST: 32 U/L (ref 14–40)
Alkaline Phosphatase: 74 U/L (ref 25–125)
Bilirubin, Total: 1.1 mg/dL

## 2015-07-17 LAB — CBC AND DIFFERENTIAL
HCT: 37 % — AB (ref 41–53)
Hemoglobin: 12.9 g/dL — AB (ref 13.5–17.5)
Neutrophils Absolute: 2 /uL
Platelets: 111 10*3/uL — AB (ref 150–399)
WBC: 3.7 10^3/mL

## 2015-07-19 ENCOUNTER — Telehealth: Payer: Self-pay | Admitting: Internal Medicine

## 2015-07-19 MED ORDER — SILDENAFIL CITRATE 20 MG PO TABS: 60.0000 mg | ORAL_TABLET | Freq: Every day | ORAL | Status: DC

## 2015-07-19 NOTE — Telephone Encounter (Signed)
Pharmacy: [facility], [place]  Reason for call: Please call in generic viagra to [facility], they will mail it to him, he said it has been discussed with Dr. Larose Kells. Please call him so he knows it has been sent per his request.

## 2015-07-19 NOTE — Telephone Encounter (Signed)
Rx sent 

## 2015-07-22 ENCOUNTER — Encounter: Payer: Self-pay | Admitting: Internal Medicine

## 2015-07-22 ENCOUNTER — Telehealth: Payer: Self-pay | Admitting: Internal Medicine

## 2015-08-01 NOTE — Telephone Encounter (Signed)
error 

## 2015-09-18 ENCOUNTER — Ambulatory Visit (INDEPENDENT_AMBULATORY_CARE_PROVIDER_SITE_OTHER): Payer: Medicare Other | Admitting: Internal Medicine

## 2015-09-18 ENCOUNTER — Encounter: Payer: Self-pay | Admitting: Internal Medicine

## 2015-09-18 VITALS — BP 118/78 | HR 77 | Temp 97.7°F | Ht 72.0 in | Wt 203.5 lb

## 2015-09-18 DIAGNOSIS — D649 Anemia, unspecified: Secondary | ICD-10-CM | POA: Diagnosis not present

## 2015-09-18 DIAGNOSIS — F319 Bipolar disorder, unspecified: Secondary | ICD-10-CM

## 2015-09-18 DIAGNOSIS — E559 Vitamin D deficiency, unspecified: Secondary | ICD-10-CM | POA: Diagnosis not present

## 2015-09-18 LAB — CBC WITH DIFFERENTIAL/PLATELET
Basophils Absolute: 0 10*3/uL (ref 0.0–0.1)
Basophils Relative: 0.4 % (ref 0.0–3.0)
Eosinophils Absolute: 0.1 10*3/uL (ref 0.0–0.7)
Eosinophils Relative: 1.3 % (ref 0.0–5.0)
HCT: 36.9 % — ABNORMAL LOW (ref 39.0–52.0)
Hemoglobin: 12.6 g/dL — ABNORMAL LOW (ref 13.0–17.0)
Lymphocytes Relative: 14.7 % (ref 12.0–46.0)
Lymphs Abs: 0.9 10*3/uL (ref 0.7–4.0)
MCHC: 34.3 g/dL (ref 30.0–36.0)
MCV: 104.2 fl — ABNORMAL HIGH (ref 78.0–100.0)
Monocytes Absolute: 0.4 10*3/uL (ref 0.1–1.0)
Monocytes Relative: 7.3 % (ref 3.0–12.0)
Neutro Abs: 4.7 10*3/uL (ref 1.4–7.7)
Neutrophils Relative %: 76.3 % (ref 43.0–77.0)
Platelets: 203 10*3/uL (ref 150.0–400.0)
RBC: 3.54 Mil/uL — ABNORMAL LOW (ref 4.22–5.81)
RDW: 17.4 % — ABNORMAL HIGH (ref 11.5–15.5)
WBC: 6.1 10*3/uL (ref 4.0–10.5)

## 2015-09-18 LAB — FERRITIN: Ferritin: 299.2 ng/mL (ref 22.0–322.0)

## 2015-09-18 LAB — IRON: Iron: 139 ug/dL (ref 42–165)

## 2015-09-18 NOTE — Progress Notes (Signed)
Pre visit review using our clinic review tool, if applicable. No additional management support is needed unless otherwise documented below in the visit note. 

## 2015-09-18 NOTE — Patient Instructions (Addendum)
GO TO THE LAB : Get the blood work     GO TO THE FRONT DESK Schedule your next appointment for a complete physical exam in 6 months, fasting    Finish ergocalciferol 50,000 units 1 tablet weekly  Also take over-the-counter vitamin D approximately 1000 units daily

## 2015-09-18 NOTE — Progress Notes (Signed)
Subjective:    Patient ID: Jeffrey Acosta, male    DOB: April 12, 1951, 65 y.o.   MRN: [id number]  DOS:  09/18/2015 Type of visit - description : Routine checkup Interval history:  History of vitamin D deficiency, on supplements. F/U by psychiatry, recently medications were adjusted and he is doing great History of alcohol abuse: sober for the last several months and feeling very well. Gout: Follow-up by rheumatology, asymptomatic, they did blood work a couple of months ago, hemoglobin and platelets were noted to be slightly low    Review of Systems   Past Medical History  Diagnosis Date  . Alcoholism in remission ([place])     in [place]  . Hx of migraines   . Hydrocele   . Diverticulosis of colon (without mention of hemorrhage)   . GERD (gastroesophageal reflux disease)   . Hiatal hernia   . Bipolar affective disorder ([place])   . Osteopenia   . Hypothyroidism   . HYPERLIPIDEMIA 01/10/2009    Qualifier: Diagnosis of  By: Jeffrey Acosta    NMR Lipoprofile 2011 LDL could not be calculated  Due to TG of 889  (811  / 750 ),  HDL 50 . Father MI @ 3 PGM CVA early 75s; PGF CVA @72    . Melanoma of skin, site unspecified 11/25/2012    07/2011 abdominal  Stage 1 Jeffrey Jeffrey Acosta Jeffrey Jeffrey Acosta   . Gout of multiple sites   . Polyarthralgia     Past Surgical History  Procedure Laterality Date  . Colonoscopy  2003    negative ; Jeffrey Jeffrey Acosta  . Upper gastrointestinal endoscopy  1983    hiatal hernia  . 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 Vasectomy  1979  . Tonsillectomy  1951  . Melanoma excision  07/28/11    Jeffrey Jeffrey Acosta; stomach and chest  . Inguinal hernia repair  08/07/2011    Procedure: HERNIA REPAIR INGUINAL ADULT;  Surgeon: Jeffrey Bookbinder, MD;  Location: [facility];  Service: General;  Laterality: Right;    Social History   Social History  . Marital Status: Married    Spouse Name: [name]  . Number of Children: 1  . Years of Education: N/A   Occupational  History  . retired    Social History Main Topics  . Smoking status: Never Smoker   . Smokeless tobacco: Never Used  . Alcohol Use: No     Comment: former alcoholic; now in remission, last drink ~ 01-01-15  . Drug Use: No  . Sexual Activity:    Partners: Female     Comment: Having some issues with ED.  Would like provider's recommendation.     Other Topics Concern  . Not on file   Social History Narrative   1 child, 2 step children   Household- pt, wife    Jeffrey Acosta (sister)        Medication List       This list is accurate as of: 09/18/15 10:41 PM.  Always use your most recent med list.               allopurinol 300 MG tablet  Commonly known as:  ZYLOPRIM  Take 300 mg by mouth daily. For gout     aspirin 81 MG tablet  Take 81 mg by mouth daily.     colchicine 0.6 MG tablet  TAKE 2 TABLETS BY MOUTH AT  FIRST SIGNS OF GOUT, THEN 1 TABLET IN 1 HOUR IF NO IMPROVEMENT. CONTINUE TO TAKE 1 TABLET DAILY UNTIL RESOLVED.--patient needs office visit before any further refills     divalproex 500 MG 24 hr tablet  Commonly known as:  DEPAKOTE ER  Take 500 mg by mouth daily.     ergocalciferol 50000 units capsule  Commonly known as:  VITAMIN D2  Take 1 capsule (50,000 Units total) by mouth once a week.     lamoTRIgine 100 MG tablet  Commonly known as:  LAMICTAL  Take 125 mg by mouth daily.     levothyroxine 50 MCG tablet  Commonly known as:  SYNTHROID, LEVOTHROID  Take 1 tablet (50 mcg total) by mouth daily before breakfast.     QUEtiapine 100 MG tablet  Commonly known as:  SEROQUEL  Take 300 mg by mouth at bedtime.     sildenafil 20 MG tablet  Commonly known as:  REVATIO  Take 3-4 tablets (60-80 mg total) by mouth daily.     zoster vaccine live (PF) 19400 UNT/0.65ML injection  Commonly known as:  ZOSTAVAX  Inject 19,400 Units into the skin once.           Objective:   Physical Exam BP 118/78 mmHg  Pulse 77  Temp(Src)  97.7 F (36.5 C) (Oral)  Ht 6' (1.829 m)  Wt 203 lb 8 oz (92.307 kg)  BMI 27.59 kg/m2  SpO2 98%  General:   Well developed, well nourished . NAD.  Neck: No  thyromegaly  HEENT:  Normocephalic . Face symmetric, atraumatic Lungs:  CTA B Normal respiratory effort, no intercostal retractions, no accessory muscle use. Heart: RRR,  no murmur.  No pretibial edema bilaterally  Skin: Exposed areas without rash. Not pale. Not jaundice Neurologic:  alert & oriented X3.  Speech normal, gait appropriate for age and unassisted Strength symmetric and appropriate for age.  Psych: Cognition and judgment appear intact.  Cooperative with normal attention span and concentration.  Behavior appropriate. No anxious or depressed appearing.    Assessment & Plan:    Assessment> Bipolar disorder-- Jeffrey Acosta, dc lithium [id number] EtOH abuse-- remission ? (+ etoh @ ER visit 12-22-14) Hypothyroidism Hyperlipidemia Gout Melanoma 2014 Jeffrey. [place] Osteopenia  T score -2.4  ( 11-2012) Vit d def dx 02-2015 GI: GERD, hiatal hernia, diverticulitis  PLAN Bipolar disorder: Olanzapine was d/c, other meds adjusted, doing great.  on Depakote, will check levels Gout: Follow-up by rheumatology, labs were reviewed, hemoglobin and platelets were low. Recheck a CBC iron and ferritin. He is up-to-date on colonoscopies. Vitamin D deficiency: Continue vitamin D supplements see instructions. RTC 6 months, CPX

## 2015-09-18 NOTE — Assessment & Plan Note (Signed)
Bipolar disorder: Olanzapine was d/c, other meds adjusted, doing great.  on Depakote, will check levels Gout: Follow-up by rheumatology, labs were reviewed, hemoglobin and platelets were low. Recheck a CBC iron and ferritin. He is up-to-date on colonoscopies. Vitamin D deficiency: Continue vitamin D supplements see instructions. RTC 6 months, CPX

## 2015-09-19 LAB — VALPROIC ACID LEVEL: Valproic Acid Lvl: <12.5 ug/mL — ABNORMAL LOW (ref 50.0–100.0)

## 2015-10-16 ENCOUNTER — Other Ambulatory Visit: Payer: Self-pay | Admitting: Internal Medicine

## 2015-11-19 ENCOUNTER — Telehealth: Payer: Self-pay | Admitting: Internal Medicine

## 2015-11-19 NOTE — Telephone Encounter (Signed)
Chart reviewed, do not see where Pt is due for any labs.

## 2015-11-19 NOTE — Telephone Encounter (Signed)
Pt called in.   Pt would like to know if he is due fu labs considering that pcp wanted to recheck some things dealing with his medication? Pt is scheduled 6 months per AVS (CPE) please advise if scheduling is needed.

## 2015-11-19 NOTE — Telephone Encounter (Signed)
Called pt to inform of the below. Left voicemail for pt.

## 2015-11-20 NOTE — Telephone Encounter (Signed)
Spoke with pt. Informed him of the previous messages. He expressed understanding.

## 2015-11-26 DIAGNOSIS — H40003 Preglaucoma, unspecified, bilateral: Secondary | ICD-10-CM | POA: Diagnosis not present

## 2015-11-26 DIAGNOSIS — H40009 Preglaucoma, unspecified, unspecified eye: Secondary | ICD-10-CM | POA: Diagnosis not present

## 2015-11-26 DIAGNOSIS — H40019 Open angle with borderline findings, low risk, unspecified eye: Secondary | ICD-10-CM | POA: Diagnosis not present

## 2015-11-26 DIAGNOSIS — H40059 Ocular hypertension, unspecified eye: Secondary | ICD-10-CM | POA: Diagnosis not present

## 2015-12-18 ENCOUNTER — Other Ambulatory Visit: Payer: Self-pay | Admitting: Internal Medicine

## 2016-01-17 DIAGNOSIS — Z79899 Other long term (current) drug therapy: Secondary | ICD-10-CM | POA: Diagnosis not present

## 2016-01-17 DIAGNOSIS — M1A09X0 Idiopathic chronic gout, multiple sites, without tophus (tophi): Secondary | ICD-10-CM | POA: Diagnosis not present

## 2016-01-17 DIAGNOSIS — M2550 Pain in unspecified joint: Secondary | ICD-10-CM | POA: Diagnosis not present

## 2016-03-10 DIAGNOSIS — H524 Presbyopia: Secondary | ICD-10-CM | POA: Diagnosis not present

## 2016-03-10 DIAGNOSIS — H5340 Unspecified visual field defects: Secondary | ICD-10-CM | POA: Diagnosis not present

## 2016-03-10 DIAGNOSIS — H40003 Preglaucoma, unspecified, bilateral: Secondary | ICD-10-CM | POA: Diagnosis not present

## 2016-03-10 DIAGNOSIS — H5203 Hypermetropia, bilateral: Secondary | ICD-10-CM | POA: Diagnosis not present

## 2016-03-10 DIAGNOSIS — H3589 Other specified retinal disorders: Secondary | ICD-10-CM | POA: Diagnosis not present

## 2016-03-10 DIAGNOSIS — H40013 Open angle with borderline findings, low risk, bilateral: Secondary | ICD-10-CM | POA: Diagnosis not present

## 2016-03-10 DIAGNOSIS — H52223 Regular astigmatism, bilateral: Secondary | ICD-10-CM | POA: Diagnosis not present

## 2016-03-23 ENCOUNTER — Encounter: Payer: Self-pay | Admitting: Internal Medicine

## 2016-03-23 ENCOUNTER — Ambulatory Visit (INDEPENDENT_AMBULATORY_CARE_PROVIDER_SITE_OTHER): Payer: Medicare Other | Admitting: Internal Medicine

## 2016-03-23 VITALS — BP 118/76 | HR 80 | Temp 97.6°F | Resp 14 | Ht 72.0 in | Wt 209.4 lb

## 2016-03-23 DIAGNOSIS — N5089 Other specified disorders of the male genital organs: Secondary | ICD-10-CM

## 2016-03-23 DIAGNOSIS — F1021 Alcohol dependence, in remission: Secondary | ICD-10-CM

## 2016-03-23 DIAGNOSIS — G8929 Other chronic pain: Secondary | ICD-10-CM

## 2016-03-23 DIAGNOSIS — Z23 Encounter for immunization: Secondary | ICD-10-CM

## 2016-03-23 DIAGNOSIS — M8580 Other specified disorders of bone density and structure, unspecified site: Secondary | ICD-10-CM

## 2016-03-23 DIAGNOSIS — F319 Bipolar disorder, unspecified: Secondary | ICD-10-CM

## 2016-03-23 DIAGNOSIS — E039 Hypothyroidism, unspecified: Secondary | ICD-10-CM

## 2016-03-23 DIAGNOSIS — D649 Anemia, unspecified: Secondary | ICD-10-CM | POA: Diagnosis not present

## 2016-03-23 DIAGNOSIS — M549 Dorsalgia, unspecified: Secondary | ICD-10-CM

## 2016-03-23 DIAGNOSIS — Z0000 Encounter for general adult medical examination without abnormal findings: Secondary | ICD-10-CM | POA: Diagnosis not present

## 2016-03-23 LAB — CBC WITH DIFFERENTIAL/PLATELET
Basophils Absolute: 0 10*3/uL (ref 0.0–0.1)
Basophils Relative: 0.4 % (ref 0.0–3.0)
Eosinophils Absolute: 0.2 10*3/uL (ref 0.0–0.7)
Eosinophils Relative: 2.8 % (ref 0.0–5.0)
HCT: 39.9 % (ref 39.0–52.0)
Hemoglobin: 13.7 g/dL (ref 13.0–17.0)
Lymphocytes Relative: 34 % (ref 12.0–46.0)
Lymphs Abs: 2.1 10*3/uL (ref 0.7–4.0)
MCHC: 34.2 g/dL (ref 30.0–36.0)
MCV: 97.2 fl (ref 78.0–100.0)
Monocytes Absolute: 0.4 10*3/uL (ref 0.1–1.0)
Monocytes Relative: 7.3 % (ref 3.0–12.0)
Neutro Abs: 3.4 10*3/uL (ref 1.4–7.7)
Neutrophils Relative %: 55.5 % (ref 43.0–77.0)
Platelets: 207 10*3/uL (ref 150.0–400.0)
RBC: 4.11 Mil/uL — ABNORMAL LOW (ref 4.22–5.81)
RDW: 13.8 % (ref 11.5–15.5)
WBC: 6.2 10*3/uL (ref 4.0–10.5)

## 2016-03-23 LAB — COMPREHENSIVE METABOLIC PANEL
ALT: 11 U/L (ref 0–53)
AST: 15 U/L (ref 0–37)
Albumin: 4.3 g/dL (ref 3.5–5.2)
Alkaline Phosphatase: 49 U/L (ref 39–117)
BUN: 16 mg/dL (ref 6–23)
CO2: 27 mEq/L (ref 19–32)
Calcium: 9.2 mg/dL (ref 8.4–10.5)
Chloride: 105 mEq/L (ref 96–112)
Creatinine, Ser: 1.21 mg/dL (ref 0.40–1.50)
GFR: 63.48 mL/min (ref >60.00)
Glucose, Bld: 110 mg/dL — ABNORMAL HIGH (ref 70–99)
Potassium: 3.5 mEq/L (ref 3.5–5.1)
Sodium: 140 mEq/L (ref 135–145)
Total Bilirubin: 0.8 mg/dL (ref 0.2–1.2)
Total Protein: 6.5 g/dL (ref 6.0–8.3)

## 2016-03-23 LAB — FOLATE: Folate: >23.4 ng/mL (ref >5.9)

## 2016-03-23 LAB — LIPID PANEL
Cholesterol: 183 mg/dL (ref 0–200)
HDL: 46.2 mg/dL (ref >39.00)
NonHDL: 136.97
Total CHOL/HDL Ratio: 4
Triglycerides: 251 mg/dL — ABNORMAL HIGH (ref 0.0–149.0)
VLDL: 50.2 mg/dL — ABNORMAL HIGH (ref 0.0–40.0)

## 2016-03-23 LAB — VITAMIN B12: Vitamin B-12: 403 pg/mL (ref 211–911)

## 2016-03-23 LAB — LDL CHOLESTEROL, DIRECT: Direct LDL: 103 mg/dL

## 2016-03-23 LAB — TSH: TSH: 1.41 u[IU]/mL (ref 0.35–4.50)

## 2016-03-23 MED ORDER — TAMSULOSIN HCL 0.4 MG PO CAPS: 0.4000 mg | ORAL_CAPSULE | Freq: Every day | ORAL | 6 refills | Status: DC

## 2016-03-23 MED ORDER — ZOSTER VACCINE LIVE 19400 UNT/0.65ML ~~LOC~~ SUSR: 0.6500 mL | Freq: Once | SUBCUTANEOUS | 0 refills | Status: AC

## 2016-03-23 NOTE — Progress Notes (Signed)
Subjective:    Patient ID: Jeffrey Acosta, male    DOB: [birth year], 65 y.o.   MRN: [id number]  DOS:  03/23/2016 Type of visit - description : CPX Interval history: Here for CPX, chronic medical issues addressed as well today Continue complaining of urinary frequency, day or night, denies any other urinary symptoms specifically no difficulty urinating. Sometimes urinates every 30-40 minutes. Chronic back pain, currently on ibuprofen  without apparent side effects, getting slightly worse. Pain is located in the mid back, worse when he bends or twists, no radiation ; occasionally has pain at the left hip. Bipolar: Good medication compliance Gout: No recent symptoms  Review of Systems Constitutional: No fever. No chills. No unexplained wt changes. No unusual sweats  HEENT: No dental problems, no ear discharge, no facial swelling, no voice changes. No eye discharge, no eye  redness , no  intolerance to light   Respiratory: No wheezing , no  difficulty breathing. No cough , no mucus production  Cardiovascular: No CP, no leg swelling , no  Palpitations  GI: no nausea, no vomiting, no diarrhea , no  abdominal pain.  No blood in the stools. No dysphagia, no odynophagia    Endocrine: No polyphagia, no polyuria , no polydipsia  GU: No dysuria, gross hematuria, difficulty urinating.    Musculoskeletal: Ongoing back pain  Skin: No change in the color of the skin, palor , no  Rash  Allergic, immunologic: No environmental allergies , no  food allergies  Neurological: No dizziness no  syncope. No headaches. No diplopia, no slurred, no slurred speech, no motor deficits, no facial  Numbness  Hematological: No enlarged lymph nodes, no easy bruising , no unusual bleedings  Psychiatry: No suicidal ideas, no hallucinations, no beavior problems, no confusion.  No unusual/severe anxiety, no depression   Past Medical History:  Diagnosis Date  . Alcoholism in remission ([place])    in [place]  . Bipolar  affective disorder ([place])   . Diverticulosis of colon (without mention of hemorrhage)   . GERD (gastroesophageal reflux disease)   . Gout of multiple sites   . Hiatal hernia   . Hx of migraines   . Hydrocele   . HYPERLIPIDEMIA 01/10/2009   Qualifier: Diagnosis of  By: Ronnald Ramp CMA, Chemira    NMR Lipoprofile 2011 LDL could not be calculated  Due to TG of 889  (811  / 750 ),  HDL 50 . Father MI @ 84 PGM CVA early 45s; PGF CVA @72    . Hypothyroidism   . Melanoma of skin, site unspecified 11/25/2012   07/2011 abdominal  Stage 1 Dr Amy Martinique Dr Sarajane Jews   . Osteopenia   . Polyarthralgia     Past Surgical History:  Procedure Laterality Date  . COLONOSCOPY  2003   negative ; Dr Sharlett Iles  . INGUINAL HERNIA REPAIR  08/07/2011   Procedure: HERNIA REPAIR INGUINAL ADULT;  Surgeon: Rolm Bookbinder, MD;  Location: [facility];  Service: General;  Laterality: Right;  . MELANOMA EXCISION  07/28/11   Dr Sarajane Jews; stomach and chest  . TONSILLECTOMY  1951  . 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 UPPER GASTROINTESTINAL ENDOSCOPY  1983   hiatal hernia  . VASECTOMY  1979    Social History   Social History  . Marital status: Married    Spouse name: [name]  . Number of children: 1  . Years of education: N/A   Occupational History  . retired  Social History Main Topics  . Smoking status: Never Smoker  . Smokeless tobacco: Never Used  . Alcohol use No     Comment: former alcoholic;   in remission   . Drug use: No  . Sexual activity: Yes    Partners: Female     Comment: Having some issues with ED.  Would like provider's recommendation.     Other Topics Concern  . Not on file   Social History Narrative   1 child, 2 step children   Household- pt, wife    Leo Rod [place] (sister)   Family History  Problem Relation Age of Onset  . COPD Father   . Heart attack Father 43  . COPD Mother   . Diabetes Paternal Grandmother   .  Parkinsonism Paternal Grandmother   . Stroke Paternal Grandmother 43  . Stroke Maternal Grandfather 72  . Lung cancer Paternal Grandfather     Heavy smoker  . Colon cancer Neg Hx   . Prostate cancer Neg Hx         Medication List       Accurate as of 03/23/16  8:23 PM. Always use your most recent med list.          allopurinol 300 MG tablet Commonly known as:  ZYLOPRIM Take 300 mg by mouth daily. For gout   ascorbic acid 500 MG tablet Commonly known as:  VITAMIN C Take 500 mg by mouth daily.   aspirin 81 MG tablet Take 81 mg by mouth daily.   colchicine 0.6 MG tablet TAKE 2 TABLETS BY MOUTH AT FIRST SIGNS OF GOUT, THEN 1 TABLET IN 1 HOUR IF NO IMPROVEMENT. CONTINUE TO TAKE 1 TABLET DAILY UNTIL RESOLVED.--patient needs office visit before any further refills   divalproex 500 MG 24 hr tablet Commonly known as:  DEPAKOTE ER Take 500 mg by mouth daily.   ergocalciferol 50000 units capsule Commonly known as:  VITAMIN D2 Take 1 capsule (50,000 Units total) by mouth once a week.   lamoTRIgine 100 MG tablet Commonly known as:  LAMICTAL Take 100 mg by mouth daily.   levothyroxine 50 MCG tablet Commonly known as:  SYNTHROID, LEVOTHROID Take 1 tablet (50 mcg total) by mouth daily before breakfast.   naltrexone 50 MG tablet Commonly known as:  DEPADE Take 1 tablet by mouth daily.   QUEtiapine 100 MG tablet Commonly known as:  SEROQUEL Take 300 mg by mouth at bedtime.   sildenafil 20 MG tablet Commonly known as:  REVATIO Take 3-4 tablets (60-80 mg total) by mouth daily.   tamsulosin 0.4 MG Caps capsule Commonly known as:  FLOMAX Take 1 capsule (0.4 mg total) by mouth daily after supper.   Zoster Vaccine Live (PF) 19400 UNT/0.65ML injection Commonly known as:  ZOSTAVAX Inject 19,400 Units into the skin once.          Objective:   Physical Exam BP 118/76 (BP Location: Left Arm, Patient Position: Sitting, Cuff Size: Normal)   Pulse 80   Temp 97.6 F (36.4  C) (Oral)   Resp 14   Ht 6' (1.829 m)   Wt 209 lb 6 oz (95 kg)   SpO2 97%   BMI 28.40 kg/m  General:   Well developed, well nourished . NAD.  Neck: No  thyromegaly  HEENT:  Normocephalic . Face symmetric, atraumatic Lungs:  CTA B Normal respiratory effort, no intercostal retractions, no accessory muscle use. Heart: RRR,  no murmur.  No pretibial edema bilaterally  Abdomen:  Not distended, soft, non-tender. No rebound or rigidity.   Skin: Exposed areas without rash. Not pale. Not jaundice Neurologic:  alert & oriented X3.  Speech normal, gait appropriate for age and unassisted Strength symmetric and appropriate for age.  Psych: Cognition and judgment appear intact.  Cooperative with normal attention span and concentration.  Behavior appropriate. No anxious or depressed appearing.     Assessment & Plan:   Assessment> Bipolar disorder-- Dr Toy Care, dc lithium 07-2014 EtOH abuse-- remission ? (+ etoh @ ER visit 12-22-14) Hypothyroidism Hyperlipidemia MSK: ---Back pain: Saw Dr. Ramos/[place]~ 2002, 3 local injections, offered surgery.  ---Gout-- Dr Berna Bue  Melanoma 2014 Dr. [place] Osteopenia  T score -2.4  ( 11-2012) Vit d def dx 02-2015 GI: GERD, hiatal hernia, diverticulitis  PLAN Bipolar:  sx controlled on Depakote, Lamictal, naltrexone, seroquel EtOH: doing well, participates on 11 meetings a week, sponsors two people.  Hypothyroidism: On Synthroid, check a TSH Hyperlipidemia: Diet control, check a FLP Back pain: Refer back to ortho, currently on ibuprofen 800 mg daily without side effects. ok Tylenol if so desire. See instructions Osteopenia: Check a DEXA. History of low vitamin D, on supplements, last levels okay. GERD: controlled on OTCs RTC 4  Months

## 2016-03-23 NOTE — Assessment & Plan Note (Signed)
Bipolar:  sx controlled on Depakote, Lamictal, naltrexone, seroquel EtOH: doing well, participates on 11 meetings a week, sponsors two people.  Hypothyroidism: On Synthroid, check a TSH Hyperlipidemia: Diet control, check a FLP Back pain: Refer back to ortho, currently on ibuprofen 800 mg daily without side effects. ok Tylenol if so desire. See instructions Osteopenia: Check a DEXA. History of low vitamin D, on supplements, last levels okay. GERD: controlled on OTCs RTC 4  Months

## 2016-03-23 NOTE — Patient Instructions (Addendum)
Get your blood work before you leave    Think about having a healthcare power of attorney  Start Flomax for your bladder symptoms. If not better let me know  Next visit in 4 months    Fall Prevention and Home Safety Falls cause injuries and can affect all age groups. It is possible to use preventive measures to significantly decrease the likelihood of falls. There are many simple measures which can make your home safer and prevent falls. OUTDOORS  Repair cracks and edges of walkways and driveways.  Remove high doorway thresholds.  Trim shrubbery on the main path into your home.  Have good outside lighting.  Clear walkways of tools, rocks, debris, and clutter.  Check that handrails are not broken and are securely fastened. Both sides of steps should have handrails.  Have leaves, snow, and ice cleared regularly.  Use sand or salt on walkways during winter months.  In the garage, clean up grease or oil spills. BATHROOM  Install night lights.  Install grab bars by the toilet and in the tub and shower.  Use non-skid mats or decals in the tub or shower.  Place a plastic non-slip stool in the shower to sit on, if needed.  Keep floors dry and clean up all water on the floor immediately.  Remove soap buildup in the tub or shower on a regular basis.  Secure bath mats with non-slip, double-sided rug tape.  Remove throw rugs and tripping hazards from the floors. BEDROOMS  Install night lights.  Make sure a bedside light is easy to reach.  Do not use oversized bedding.  Keep a telephone by your bedside.  Have a firm chair with side arms to use for getting dressed.  Remove throw rugs and tripping hazards from the floor. KITCHEN  Keep handles on pots and pans turned toward the center of the stove. Use back burners when possible.  Clean up spills quickly and allow time for drying.  Avoid walking on wet floors.  Avoid hot utensils and knives.  Position shelves so  they are not too high or low.  Place commonly used objects within easy reach.  If necessary, use a sturdy step stool with a grab bar when reaching.  Keep electrical cables out of the way.  Do not use floor polish or wax that makes floors slippery. If you must use wax, use non-skid floor wax.  Remove throw rugs and tripping hazards from the floor. STAIRWAYS  Never leave objects on stairs.  Place handrails on both sides of stairways and use them. Fix any loose handrails. Make sure handrails on both sides of the stairways are as long as the stairs.  Check carpeting to make sure it is firmly attached along stairs. Make repairs to worn or loose carpet promptly.  Avoid placing throw rugs at the top or bottom of stairways, or properly secure the rug with carpet tape to prevent slippage. Get rid of throw rugs, if possible.  Have an electrician put in a light switch at the top and bottom of the stairs. OTHER FALL PREVENTION TIPS  Wear low-heel or rubber-soled shoes that are supportive and fit well. Wear closed toe shoes.  When using a stepladder, make sure it is fully opened and both spreaders are firmly locked. Do not climb a closed stepladder.  Add color or contrast paint or tape to grab bars and handrails in your home. Place contrasting color strips on first and last steps.  Learn and use mobility aids as  needed. Install an electrical emergency response system.  Turn on lights to avoid dark areas. Replace light bulbs that burn out immediately. Get light switches that glow.  Arrange furniture to create clear pathways. Keep furniture in the same place.  Firmly attach carpet with non-skid or double-sided tape.  Eliminate uneven floor surfaces.  Select a carpet pattern that does not visually hide the edge of steps.  Be aware of all pets. OTHER HOME SAFETY TIPS  Set the water temperature for 120 F (48.8 C).  Keep emergency numbers on or near the telephone.  Keep smoke  detectors on every level of the home and near sleeping areas. Document Released: 04/03/2002 Document Revised: 10/13/2011 Document Reviewed: 07/03/2011 [facility] Patient Information 2015 [place], [place]. This information is not intended to replace advice given to you by your health care provider. Make sure you discuss any questions you have with your health care provider.   Preventive Care for Adults Ages 32 and over  Blood pressure check.** / Every 1 to 2 years.  Lipid and cholesterol check.**/ Every 5 years beginning at age 44.  Lung cancer screening. / Every year if you are aged 54-80 years and have a 30-pack-year history of smoking and currently smoke or have quit within the past 15 years. Yearly screening is stopped once you have quit smoking for at least 15 years or develop a health problem that would prevent you from having lung cancer treatment.  Fecal occult blood test (FOBT) of stool. / Every year beginning at age 94 and continuing until age 77. You may not have to do this test if you get a colonoscopy every 10 years.  Flexible sigmoidoscopy** or colonoscopy.** / Every 5 years for a flexible sigmoidoscopy or every 10 years for a colonoscopy beginning at age 33 and continuing until age 74.  Hepatitis C blood test.** / For all people born from 1 through 1965 and any individual with known risks for hepatitis C.  Abdominal aortic aneurysm (AAA) screening.** / A one-time screening for ages 73 to 93 years who are current or former smokers.  Skin self-exam. / Monthly.  Influenza vaccine. / Every year.  Tetanus, diphtheria, and acellular pertussis (Tdap/Td) vaccine.** / 1 dose of Td every 10 years.  Varicella vaccine.** / Consult your health care provider.  Zoster vaccine.** / 1 dose for adults aged 70 years or older.  Pneumococcal 13-valent conjugate (PCV13) vaccine.** / Consult your health care provider.  Pneumococcal polysaccharide (PPSV23) vaccine.** / 1 dose for all adults  aged 68 years and older.  Meningococcal vaccine.** / Consult your health care provider.  Hepatitis A vaccine.** / Consult your health care provider.  Hepatitis B vaccine.** / Consult your health care provider.  Haemophilus influenzae type b (Hib) vaccine.** / Consult your health care provider. **Family history and personal history of risk and conditions may change your health care provider's recommendations. Document Released: 06/09/2001 Document Revised: 04/18/2013 Document Reviewed: 09/08/2010 [facility] Patient Information 2015 [place], [place]. This information is not intended to replace advice given to you by your health care provider. Make sure you discuss any questions you have with your health care provider.

## 2016-03-23 NOTE — Assessment & Plan Note (Addendum)
Tdap-- 2014; prevnar-- 2016; PNA shot 23- 2009; flu shot today zostavax-- rx provided before, not used , Rx reprinted today  CCS: Last cscope  2015. Some Diverticulosis , Dr Henrene Pastor, 10 years  Prostate cancer screening: DRE, PSA wnl 2016  Diet exercise discussed  Labs: CMP, FLP, CBC, TSH, 123456, folic acid, bone density test Diet, exercise, healthcare power of attorney and fall prevention discussed

## 2016-03-23 NOTE — Progress Notes (Signed)
Pre visit review using our clinic review tool, if applicable. No additional management support is needed unless otherwise documented below in the visit note. 

## 2016-03-26 DIAGNOSIS — G8929 Other chronic pain: Secondary | ICD-10-CM | POA: Diagnosis not present

## 2016-03-26 DIAGNOSIS — M545 Low back pain: Secondary | ICD-10-CM | POA: Diagnosis not present

## 2016-06-12 ENCOUNTER — Other Ambulatory Visit: Payer: Self-pay | Admitting: Internal Medicine

## 2016-07-15 ENCOUNTER — Telehealth: Payer: Self-pay | Admitting: *Deleted

## 2016-07-15 NOTE — Telephone Encounter (Signed)
AWV scheduled for 07/21/16 @8 .

## 2016-07-16 DIAGNOSIS — Z6825 Body mass index (BMI) 25.0-25.9, adult: Secondary | ICD-10-CM | POA: Diagnosis not present

## 2016-07-16 DIAGNOSIS — M1A09X0 Idiopathic chronic gout, multiple sites, without tophus (tophi): Secondary | ICD-10-CM | POA: Diagnosis not present

## 2016-07-16 DIAGNOSIS — E663 Overweight: Secondary | ICD-10-CM | POA: Diagnosis not present

## 2016-07-16 DIAGNOSIS — M2550 Pain in unspecified joint: Secondary | ICD-10-CM | POA: Diagnosis not present

## 2016-07-16 NOTE — Progress Notes (Signed)
Subjective:   Jeffrey Acosta is a 66 y.o. male who presents for Medicare Annual/Subsequent preventive examination.  Review of Systems:  No ROS.  Medicare Wellness Visit.   Sleep patterns:  Seroquel helps with falling asleep. Sleeps 5-7 hrs per night. Feels rested. Home Safety/Smoke Alarms:  Feels safe in home. Smoke alarms in place.  Living environment; residence and Firearm Safety: Lives with wife. Guns safely stored. Seat Belt Safety/Bike Helmet: Wears seat belt.   Counseling:   Eye Exam- wears glasses. Dr.[place] every 6 months. Dental- every 6 months.  Male:   CCS- Last  11/22/13:Moderate diverticulosis. Otherwise Normal. Repeat in 10 yrs per report.   PSA-  Lab Results  Component Value Date   PSA 0.91 03/20/2015   PSA 0.79 11/17/2006        Objective:    Vitals: There were no vitals taken for this visit.  There is no height or weight on file to calculate BMI.  Tobacco History  Smoking Status  . Never Smoker  Smokeless Tobacco  . Never Used     Counseling given: Not Answered   Past Medical History:  Diagnosis Date  . Alcoholism in remission ([place])    in [place]  . Bipolar affective disorder ([place])   . Diverticulosis of colon (without mention of hemorrhage)   . GERD (gastroesophageal reflux disease)   . Gout of multiple sites   . Hiatal hernia   . Hx of migraines   . Hydrocele   . HYPERLIPIDEMIA 01/10/2009   Qualifier: Diagnosis of  By: Ronnald Ramp CMA, Chemira    NMR Lipoprofile 2011 LDL could not be calculated  Due to TG of 889  (811  / 750 ),  HDL 50 . Father MI @ 62 PGM CVA early 26s; PGF CVA @72    . Hypothyroidism   . Melanoma of skin, site unspecified 11/25/2012   07/2011 abdominal  Stage 1 Dr Amy Martinique Dr Sarajane Jews   . Osteopenia   . Polyarthralgia    Past Surgical History:  Procedure Laterality Date  . COLONOSCOPY  2003   negative ; Dr Sharlett Iles  . INGUINAL HERNIA REPAIR  08/07/2011   Procedure: HERNIA REPAIR INGUINAL ADULT;  Surgeon: Rolm Bookbinder, MD;   Location: [facility];  Service: General;  Laterality: Right;  . MELANOMA EXCISION  07/28/11   Dr Sarajane Jews; stomach and chest  . TONSILLECTOMY  1951  . 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 UPPER GASTROINTESTINAL ENDOSCOPY  1983   hiatal hernia  . VASECTOMY  1979   Family History  Problem Relation Age of Onset  . COPD Father   . Heart attack Father 2  . COPD Mother   . Diabetes Paternal Grandmother   . Parkinsonism Paternal Grandmother   . Stroke Paternal Grandmother 75  . Stroke Maternal Grandfather 72  . Lung cancer Paternal Grandfather     Heavy smoker  . Colon cancer Neg Hx   . Prostate cancer Neg Hx    History  Sexual Activity  . Sexual activity: Yes  . Partners: Female    Comment: Having some issues with ED.  Would like provider's recommendation.      Outpatient Encounter Prescriptions as of 07/21/2016  Medication Sig  . allopurinol (ZYLOPRIM) 300 MG tablet Take 300 mg by mouth daily. For gout  . ascorbic acid (VITAMIN C) 500 MG tablet Take 500 mg by mouth daily.  Marland Kitchen aspirin 81 MG tablet Take 81 mg by mouth daily.  Marland Kitchen  colchicine 0.6 MG tablet TAKE 2 TABLETS BY MOUTH AT FIRST SIGNS OF GOUT, THEN 1 TABLET IN 1 HOUR IF NO IMPROVEMENT. CONTINUE TO TAKE 1 TABLET DAILY UNTIL RESOLVED.--patient needs office visit before any further refills  . divalproex (DEPAKOTE ER) 500 MG 24 hr tablet Take 500 mg by mouth daily.  . ergocalciferol (VITAMIN D2) 50000 UNITS capsule Take 1 capsule (50,000 Units total) by mouth once a week. (Patient not taking: Reported on 03/23/2016)  . lamoTRIgine (LAMICTAL) 100 MG tablet Take 100 mg by mouth daily.   Marland Kitchen levothyroxine (SYNTHROID, LEVOTHROID) 50 MCG tablet Take 1 tablet (50 mcg total) by mouth daily before breakfast.  . naltrexone (DEPADE) 50 MG tablet Take 1 tablet by mouth daily.  . QUEtiapine (SEROQUEL) 100 MG tablet Take 300 mg by mouth at bedtime.   . sildenafil (REVATIO) 20 MG tablet Take 3-4 tablets (60-80 mg total)  by mouth daily.  . tamsulosin (FLOMAX) 0.4 MG CAPS capsule Take 1 capsule (0.4 mg total) by mouth daily after supper.   No facility-administered encounter medications on file as of 07/21/2016.     Activities of Daily Living In your present state of health, do you have any difficulty performing the following activities: 03/23/2016 09/18/2015  Hearing? N N  Vision? N N  Difficulty concentrating or making decisions? N N  Walking or climbing stairs? N N  Dressing or bathing? N N  Doing errands, shopping? N N  Some recent data might be hidden    Patient Care Team: Colon Branch, MD as PCP - General (Internal Medicine) Chucky May, MD as Consulting Physician (Psychiatry) Kathrynn Ducking, MD as Consulting Physician (Neurology) Ludwig Clarks, DO as Consulting Physician (Neurology) Marica Otter, OD as Consulting Physician (Optometry) Irene Shipper, MD as Consulting Physician (Gastroenterology) Demetrius Revel, DDS as Consulting Physician (Dentistry) Gavin Pound, MD as Consulting Physician (Rheumatology) Amy Martinique, MD as Consulting Physician (Dermatology)   Assessment:    Physical assessment deferred to PCP.  Exercise Activities and Dietary recommendations    Diet (meal preparation, eat out, water intake, caffeinated beverages, dairy products, fruits and vegetables): in general, a "healthy" diet   Drinks 3-4 bottles of water per day.      Goals      Patient Stated   . Improve diet:  Less meat (3 meats per week), less sweets (3- 4 servings per week) and more fruit (fruit to replace desserts).   (pt-stated)      Other   . Increase physical activity          Primarily walking on a regular basis.        Fall Risk Fall Risk  03/23/2016 09/18/2015 04/01/2015 03/20/2015 07/26/2014  Falls in the past year? No No Yes No Yes  Number falls in past yr: - - 2 or more - 2 or more  Injury with Fall? - - No - No  Risk for fall due to : - - Impaired balance/gait;Medication side effect - -    Risk for fall due to (comments): - - At risk for falls due to lower back pain and left leg pain.   - -  Follow up - - Education provided;Falls prevention discussed - -   Depression Screen PHQ 2/9 Scores 03/23/2016 09/18/2015 04/01/2015 03/20/2015  PHQ - 2 Score 0 0 0 0    Cognitive Function Ad8 score reviewed for issues:  Issues making decisions:no  Less interest in hobbies / activities:no  Repeats questions, stories (family complaining):no  Trouble using ordinary gadgets (microwave, computer, phone):no  Forgets the month or year: no  Mismanaging finances: no  Remembering appts:no  Daily problems with thinking and/or memory:no Ad8 score is=0            Immunization History  Administered Date(s) Administered  . Influenza Split 02/02/2012  . Influenza Whole 03/17/2007, 02/08/2008, 02/07/2009, 05/20/2010  . Influenza, High Dose Seasonal PF 03/20/2015, 03/23/2016  . Influenza,inj,Quad PF,36+ Mos 01/08/2014  . Pneumococcal Conjugate-13 03/20/2015  . Pneumococcal Polysaccharide-23 02/17/2008  . Tdap 11/29/2012   Screening Tests Health Maintenance  Topic Date Due  . PNA vac Low Risk Adult (2 of 2 - PPSV23) 03/19/2016  . Hepatitis C Screening  09/16/2016 (Originally [birth year])  . TETANUS/TDAP  11/30/2022  . COLONOSCOPY  11/23/2023  . INFLUENZA VACCINE  Completed      Plan:     Follow up with Dr.Paz today as scheduled  Continue to eat heart healthy diet (full of fruits, vegetables, whole grains, lean protein, water--limit salt, fat, and sugar intake) and increase physical activity as tolerated.  Continue doing brain stimulating activities (puzzles, reading, adult coloring books, staying active) to keep memory sharp.    During the course of the visit the patient was educated and counseled about the following appropriate screening and preventive services:   Vaccines to include Pneumoccal, Influenza,Td, HCV  Cardiovascular Disease  Colorectal cancer  screening  Diabetes screening  Prostate Cancer Screening  Glaucoma screening  Nutrition counseling   Smoking cessation counseling  Patient Instructions (the written plan) was given to the patient.    Naaman Plummer [place], [place]  07/16/2016  Kathlene November, MD

## 2016-07-16 NOTE — Progress Notes (Signed)
Pre visit review using our clinic review tool, if applicable. No additional management support is needed unless otherwise documented below in the visit note. 

## 2016-07-21 ENCOUNTER — Encounter: Payer: Self-pay | Admitting: Internal Medicine

## 2016-07-21 ENCOUNTER — Ambulatory Visit (INDEPENDENT_AMBULATORY_CARE_PROVIDER_SITE_OTHER): Payer: Medicare Other | Admitting: Internal Medicine

## 2016-07-21 VITALS — BP 126/76 | HR 80 | Ht 72.0 in | Wt 201.2 lb

## 2016-07-21 DIAGNOSIS — Z0000 Encounter for general adult medical examination without abnormal findings: Secondary | ICD-10-CM

## 2016-07-21 DIAGNOSIS — M10071 Idiopathic gout, right ankle and foot: Secondary | ICD-10-CM | POA: Diagnosis not present

## 2016-07-21 DIAGNOSIS — E559 Vitamin D deficiency, unspecified: Secondary | ICD-10-CM | POA: Diagnosis not present

## 2016-07-21 DIAGNOSIS — F1021 Alcohol dependence, in remission: Secondary | ICD-10-CM | POA: Diagnosis not present

## 2016-07-21 DIAGNOSIS — M8580 Other specified disorders of bone density and structure, unspecified site: Secondary | ICD-10-CM | POA: Diagnosis not present

## 2016-07-21 LAB — COMPREHENSIVE METABOLIC PANEL
ALT: 19 U/L (ref 0–53)
AST: 19 U/L (ref 0–37)
Albumin: 4.2 g/dL (ref 3.5–5.2)
Alkaline Phosphatase: 73 U/L (ref 39–117)
BUN: 13 mg/dL (ref 6–23)
CO2: 30 mEq/L (ref 19–32)
Calcium: 9.5 mg/dL (ref 8.4–10.5)
Chloride: 101 mEq/L (ref 96–112)
Creatinine, Ser: 1.2 mg/dL (ref 0.40–1.50)
GFR: 64.03 mL/min (ref >60.00)
Glucose, Bld: 123 mg/dL — ABNORMAL HIGH (ref 70–99)
Potassium: 3.7 mEq/L (ref 3.5–5.1)
Sodium: 140 mEq/L (ref 135–145)
Total Bilirubin: 1.4 mg/dL — ABNORMAL HIGH (ref 0.2–1.2)
Total Protein: 6.4 g/dL (ref 6.0–8.3)

## 2016-07-21 LAB — CBC WITH DIFFERENTIAL/PLATELET
Basophils Absolute: 0 10*3/uL (ref 0.0–0.1)
Basophils Relative: 0.4 % (ref 0.0–3.0)
Eosinophils Absolute: 0.1 10*3/uL (ref 0.0–0.7)
Eosinophils Relative: 1.1 % (ref 0.0–5.0)
HCT: 39.3 % (ref 39.0–52.0)
Hemoglobin: 14 g/dL (ref 13.0–17.0)
Lymphocytes Relative: 14 % (ref 12.0–46.0)
Lymphs Abs: 1.1 10*3/uL (ref 0.7–4.0)
MCHC: 35.7 g/dL (ref 30.0–36.0)
MCV: 95.7 fl (ref 78.0–100.0)
Monocytes Absolute: 0.6 10*3/uL (ref 0.1–1.0)
Monocytes Relative: 7.7 % (ref 3.0–12.0)
Neutro Abs: 5.8 10*3/uL (ref 1.4–7.7)
Neutrophils Relative %: 76.8 % (ref 43.0–77.0)
Platelets: 121 10*3/uL — ABNORMAL LOW (ref 150.0–400.0)
RBC: 4.11 Mil/uL — ABNORMAL LOW (ref 4.22–5.81)
RDW: 16.3 % — ABNORMAL HIGH (ref 11.5–15.5)
WBC: 7.5 10*3/uL (ref 4.0–10.5)

## 2016-07-21 LAB — URIC ACID: Uric Acid, Serum: 9 mg/dL — ABNORMAL HIGH (ref 4.0–7.8)

## 2016-07-21 NOTE — Patient Instructions (Addendum)
GO TO THE LAB : Get the blood work     GO TO THE FRONT DESK Schedule your next appointment for a checkup in 6 months  Continue over-the-counter vitamin D, 1000 units daily  ============= Continue to eat heart healthy diet (full of fruits, vegetables, whole grains, lean protein, water--limit salt, fat, and sugar intake) and increase physical activity as tolerated.  Continue doing brain stimulating activities (puzzles, reading, adult coloring books, staying active) to keep memory sharp.

## 2016-07-21 NOTE — Progress Notes (Signed)
Subjective:    Patient ID: Jeffrey Acosta, male    DOB: [birth year], 66 y.o.   MRN: [id number]  DOS:  07/21/2016 Type of visit - description : rov Interval history:  No major concerns, feeling well. Back pain: Saw orthopedic surgery, x-rays were done, DX with DJD, was switch from Motrin to Indocin which helps EtOH: sober for > than a year L UTS: Symptoms decrease with Flomax, minimal nocturia. dexa  test not done. Patient would be interested.   Review of Systems No recent gout episodes, on allopurinol  Past Medical History:  Diagnosis Date  . Alcoholism in remission ([place])    in [place]  . Bipolar affective disorder ([place])   . Diverticulosis of colon (without mention of hemorrhage)   . GERD (gastroesophageal reflux disease)   . Gout of multiple sites   . Hiatal hernia   . Hx of migraines   . Hydrocele   . HYPERLIPIDEMIA 01/10/2009   Qualifier: Diagnosis of  By: Ronnald Ramp CMA, Chemira    NMR Lipoprofile 2011 LDL could not be calculated  Due to TG of 889  (811  / 750 ),  HDL 50 . Father MI @ 72 PGM CVA early 73s; PGF CVA @72    . Hypothyroidism   . Melanoma of skin, site unspecified 11/25/2012   07/2011 abdominal  Stage 1 Dr Amy Martinique Dr Sarajane Jews   . Osteopenia   . Polyarthralgia     Past Surgical History:  Procedure Laterality Date  . COLONOSCOPY  2003   negative ; Dr Sharlett Iles  . INGUINAL HERNIA REPAIR  08/07/2011   Procedure: HERNIA REPAIR INGUINAL ADULT;  Surgeon: Rolm Bookbinder, MD;  Location: [facility];  Service: General;  Laterality: Right;  . MELANOMA EXCISION  07/28/11   Dr Sarajane Jews; stomach and chest  . TONSILLECTOMY  1951  . 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 UPPER GASTROINTESTINAL ENDOSCOPY  1983   hiatal hernia  . VASECTOMY  1979    Social History   Social History  . Marital status: Married    Spouse name: [name]  . Number of children: 1  . Years of education: N/A   Occupational History  . retired    Social History Main Topics    . Smoking status: Never Smoker  . Smokeless tobacco: Never Used  . Alcohol use No     Comment: former alcoholic;   in remission   . Drug use: No  . Sexual activity: Yes    Partners: Female     Comment: Having some issues with ED.  Would like provider's recommendation.     Other Topics Concern  . Not on file   Social History Narrative   1 child, 2 step children   Household- pt, wife    [name]      Allergies as of 07/21/2016      Reactions   Omnipaque [iohexol]     Desc: developed hives after injection; resolved with 50 mg benadryl given to pt.      Medication List       Accurate as of 07/21/16 11:59 PM. Always use your most recent med list.          allopurinol 300 MG tablet Commonly known as:  ZYLOPRIM Take 300 mg by mouth daily. For gout   ascorbic acid 500 MG tablet Commonly known as:  VITAMIN C Take 500 mg by mouth daily.  aspirin 81 MG tablet Take 81 mg by mouth daily.   colchicine 0.6 MG tablet TAKE 2 TABLETS BY MOUTH AT FIRST SIGNS OF GOUT, THEN 1 TABLET IN 1 HOUR IF NO IMPROVEMENT. CONTINUE TO TAKE 1 TABLET DAILY UNTIL RESOLVED.--patient needs office visit before any further refills   divalproex 500 MG 24 hr tablet Commonly known as:  DEPAKOTE ER Take 500 mg by mouth daily.   indomethacin 25 MG capsule Commonly known as:  INDOCIN TK 1 C PO AFTER MEALS Q 12 H   lamoTRIgine 100 MG tablet Commonly known as:  LAMICTAL Take 100 mg by mouth daily.   levothyroxine 50 MCG tablet Commonly known as:  SYNTHROID, LEVOTHROID Take 1 tablet (50 mcg total) by mouth daily before breakfast.   naltrexone 50 MG tablet Commonly known as:  DEPADE Take 1 tablet by mouth daily.   QUEtiapine 100 MG tablet Commonly known as:  SEROQUEL Take 400 mg by mouth at bedtime.   sildenafil 20 MG tablet Commonly known as:  REVATIO Take 3-4 tablets (60-80 mg total) by mouth daily.   tamsulosin 0.4 MG Caps capsule Commonly  known as:  FLOMAX Take 1 capsule (0.4 mg total) by mouth daily after supper.          Objective:   Physical Exam BP 126/76 (BP Location: Left Arm, Patient Position: Sitting, Cuff Size: Large)   Pulse 80   Ht 6' (1.829 m)   Wt 201 lb 3.2 oz (91.3 kg)   SpO2 99%   BMI 27.29 kg/m  General:   Well developed, well nourished . NAD.  HEENT:  Normocephalic . Face symmetric, atraumatic Lungs:  CTA B Normal respiratory effort, no intercostal retractions, no accessory muscle use. Heart: RRR,  no murmur.  no pretibial edema bilaterally  Abdomen:  Not distended, soft, non-tender. No rebound or rigidity.   Skin: Not pale. Not jaundice Neurologic:  alert & oriented X3.  Speech normal, gait appropriate for age and unassisted Psych--  Cognition and judgment appear intact.  Cooperative with normal attention span and concentration.  Behavior appropriate. No anxious or depressed appearing.     Assessment & Plan:   Assessment  Bipolar disorder-- Dr Toy Care, dc lithium 07-2014 EtOH abuse-- remission ? (+ etoh @ ER visit 12-22-14) Hypothyroidism Hyperlipidemia MSK: ---Back pain: Saw Dr. Ramos/Geoffrey~ 2002, 3 local injections, offered surgery.  ---Gout-- Dr Berna Bue , released from her office Melanoma 2014 Dr. Martinique Osteopenia  T score -2.4  ( 11-2012) Vit d def dx 02-2015 GI: GERD, hiatal hernia, diverticulitis  PLAN EtOH: Reports no etoh in > 1 year Bipolar disorder: Seems to be doing well, Dr. Toy Care requested all labs to be faxed to him Hypothyroidism: Last TSH satisfactory, continue Synthroid Back pain: Went to see ortho w/ back pain, x-rays  done, was recommended indocin which is helping (he d/c Motrin)  . Gout: No recent episodes, on allopurinol, Dr. Lenna Gilford request a CMP CBC and uric acid. If that looks okay, she will release pt back to me. Will fax the results to her. Vit D  deficiency: Currently on OTCs, checking labs Osteopenia: Failed last DEXA referral, refer again L  UTS:  nocturia decreased with Flomax RTC 6 months

## 2016-07-22 NOTE — Assessment & Plan Note (Signed)
EtOH: Reports no etoh in > 1 year Bipolar disorder: Seems to be doing well, Dr. Toy Care requested all labs to be faxed to him Hypothyroidism: Last TSH satisfactory, continue Synthroid Back pain: Went to see ortho w/ back pain, x-rays  done, was recommended indocin which is helping (he d/c Motrin)  . Gout: No recent episodes, on allopurinol, Dr. Lenna Gilford request a CMP CBC and uric acid. If that looks okay, she will release pt back to me. Will fax the results to her. Vit D  deficiency: Currently on OTCs, checking labs Osteopenia: Failed last DEXA referral, refer again L UTS:  nocturia decreased with Flomax RTC 6 months

## 2016-07-23 ENCOUNTER — Other Ambulatory Visit: Payer: Self-pay | Admitting: Medical

## 2016-07-23 ENCOUNTER — Ambulatory Visit (INDEPENDENT_AMBULATORY_CARE_PROVIDER_SITE_OTHER): Payer: Medicare Other | Admitting: Medical

## 2016-07-23 ENCOUNTER — Encounter: Payer: Self-pay | Admitting: Medical

## 2016-07-23 VITALS — BP 150/78 | HR 80 | Temp 98.3°F | Resp 16 | Ht 72.0 in | Wt 201.1 lb

## 2016-07-23 DIAGNOSIS — J029 Acute pharyngitis, unspecified: Secondary | ICD-10-CM

## 2016-07-23 DIAGNOSIS — J0100 Acute maxillary sinusitis, unspecified: Secondary | ICD-10-CM

## 2016-07-23 DIAGNOSIS — R059 Cough, unspecified: Secondary | ICD-10-CM

## 2016-07-23 DIAGNOSIS — H938X3 Other specified disorders of ear, bilateral: Secondary | ICD-10-CM

## 2016-07-23 DIAGNOSIS — R05 Cough: Secondary | ICD-10-CM

## 2016-07-23 LAB — POCT RAPID STREP A (OFFICE): Rapid Strep A Screen: NEGATIVE

## 2016-07-23 LAB — VITAMIN D 1,25 DIHYDROXY
Vitamin D 1, 25 (OH)2 Total: 35 pg/mL (ref 18–72)
Vitamin D2 1, 25 (OH)2: <8 pg/mL
Vitamin D3 1, 25 (OH)2: 35 pg/mL

## 2016-07-23 LAB — POC INFLUENZA A&B (BINAX/QUICKVUE)
Influenza A, POC: NEGATIVE
Influenza B, POC: NEGATIVE

## 2016-07-23 MED ORDER — BENZONATATE 100 MG PO CAPS: 100.0000 mg | ORAL_CAPSULE | Freq: Three times a day (TID) | ORAL | 0 refills | Status: DC | PRN

## 2016-07-23 MED ORDER — AMOXICILLIN-POT CLAVULANATE 875-125 MG PO TABS: 1.0000 | ORAL_TABLET | Freq: Two times a day (BID) | ORAL | 0 refills | Status: DC

## 2016-07-23 MED ORDER — FLUTICASONE PROPIONATE 50 MCG/ACT NA SUSP: 2.0000 | Freq: Every day | NASAL | 1 refills | Status: DC

## 2016-07-23 NOTE — Progress Notes (Signed)
Subjective:    Patient ID: JATINDER MCDONAGH, male    DOB: [birth year], 66 y.o.   MRN: [id number]  HPI  Pt in feeling sick 8 days of nasal congestion, sinus pressure, ear pressure, and some st.   Pt has coughed up some mucous colored. Pt denied feeling chest congested. He does have st worse in morning and at night.  Some sneezing.  Pt family has been visiting son and grandkids who have been sick.  Review of Systems  Constitutional: Negative for chills, fatigue and fever.  HENT: Positive for congestion, ear pain, sinus pain, sinus pressure and sore throat.   Respiratory: Positive for cough. Negative for shortness of breath.   Cardiovascular: Negative for chest pain and palpitations.  Gastrointestinal: Negative for abdominal pain, constipation, diarrhea, nausea and vomiting.  Genitourinary: Negative for dysuria.  Musculoskeletal: Negative for back pain and myalgias.  Skin: Negative for rash.  Neurological: Negative for dizziness, tremors, seizures, weakness and headaches.  Hematological: Negative for adenopathy.  Psychiatric/Behavioral: Negative for behavioral problems and confusion.     Past Medical History:  Diagnosis Date  . Alcoholism in remission ([place])    in [place]  . Bipolar affective disorder ([place])   . Diverticulosis of colon (without mention of hemorrhage)   . GERD (gastroesophageal reflux disease)   . Gout of multiple sites   . Hiatal hernia   . Hx of migraines   . Hydrocele   . HYPERLIPIDEMIA 01/10/2009   Qualifier: Diagnosis of  By: Ronnald Ramp CMA, Chemira    NMR Lipoprofile 2011 LDL could not be calculated  Due to TG of 889  (811  / 750 ),  HDL 50 . Father MI @ 42 PGM CVA early 30s; PGF CVA @72    . Hypothyroidism   . Melanoma of skin, site unspecified 11/25/2012   07/2011 abdominal  Stage 1 Dr Amy Martinique Dr Sarajane Jews   . Osteopenia   . Polyarthralgia      Social History   Social History  . Marital status: Married    Spouse name: [name]  . Number of children: 1  . Years  of education: N/A   Occupational History  . retired    Social History Main Topics  . Smoking status: Never Smoker  . Smokeless tobacco: Never Used  . Alcohol use No     Comment: former alcoholic;   in remission   . Drug use: No  . Sexual activity: Yes    Partners: Female     Comment: Having some issues with ED.  Would like provider's recommendation.     Other Topics Concern  . Not on file   Social History Narrative   1 child, 2 step children   Household- pt, wife    Leo Rod [place] (sister)    Past Surgical History:  Procedure Laterality Date  . COLONOSCOPY  2003   negative ; Dr Sharlett Iles  . INGUINAL HERNIA REPAIR  08/07/2011   Procedure: HERNIA REPAIR INGUINAL ADULT;  Surgeon: Rolm Bookbinder, MD;  Location: [facility];  Service: General;  Laterality: Right;  . MELANOMA EXCISION  07/28/11   Dr Sarajane Jews; stomach and chest  . TONSILLECTOMY  1951  . 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 UPPER GASTROINTESTINAL ENDOSCOPY  1983   hiatal hernia  . VASECTOMY  1979    Family History  Problem Relation Age of Onset  . COPD Father   . Heart attack Father 81  .  COPD Mother   . Diabetes Paternal Grandmother   . Parkinsonism Paternal Grandmother   . Stroke Paternal Grandmother 32  . Stroke Maternal Grandfather 72  . Lung cancer Paternal Grandfather     Heavy smoker  . Colon cancer Neg Hx   . Prostate cancer Neg Hx     Allergies  Allergen Reactions  . Omnipaque [Iohexol]      Desc: developed hives after injection; resolved with 50 mg benadryl given to pt.     Current Outpatient Prescriptions on File Prior to Visit  Medication Sig Dispense Refill  . allopurinol (ZYLOPRIM) 300 MG tablet Take 300 mg by mouth daily. For gout    . ascorbic acid (VITAMIN C) 500 MG tablet Take 500 mg by mouth daily.    Marland Kitchen aspirin 81 MG tablet Take 81 mg by mouth daily.    . colchicine 0.6 MG tablet TAKE 2 TABLETS BY MOUTH AT FIRST  SIGNS OF GOUT, THEN 1 TABLET IN 1 HOUR IF NO IMPROVEMENT. CONTINUE TO TAKE 1 TABLET DAILY UNTIL RESOLVED.--patient needs office visit before any further refills 30 tablet 1  . divalproex (DEPAKOTE ER) 500 MG 24 hr tablet Take 500 mg by mouth daily.    . indomethacin (INDOCIN) 25 MG capsule TK 1 C PO AFTER MEALS Q 12 H  2  . lamoTRIgine (LAMICTAL) 100 MG tablet Take 100 mg by mouth daily.     Marland Kitchen levothyroxine (SYNTHROID, LEVOTHROID) 50 MCG tablet Take 1 tablet (50 mcg total) by mouth daily before breakfast. 30 tablet 5  . naltrexone (DEPADE) 50 MG tablet Take 1 tablet by mouth daily.  12  . QUEtiapine (SEROQUEL) 100 MG tablet Take 400 mg by mouth at bedtime.   98  . sildenafil (REVATIO) 20 MG tablet Take 3-4 tablets (60-80 mg total) by mouth daily. (Patient taking differently: Take 60-80 mg by mouth daily as needed. ) 30 tablet 3  . tamsulosin (FLOMAX) 0.4 MG CAPS capsule Take 1 capsule (0.4 mg total) by mouth daily after supper. 30 capsule 6   No current facility-administered medications on file prior to visit.     BP (!) 150/78 (BP Location: Left Arm, Patient Position: Sitting, Cuff Size: Large)   Pulse 80   Temp 98.3 F (36.8 C) (Oral)   Resp 16   Ht 6' (1.829 m)   Wt 201 lb 2 oz (91.2 kg)   SpO2 100%   BMI 27.28 kg/m       Objective:   Physical Exam  General  Mental Status - Alert. General Appearance - Well groomed. Not in acute distress.  Skin Rashes- No Rashes.  HEENT Head- Normal. Ear Auditory Canal - Left- Normal. Right - Normal.Tympanic Membrane- Left- Normal. Right- Normal. Eye Sclera/Conjunctiva- Left- Normal. Right- Normal. Nose & Sinuses Nasal Mucosa- Left-  Boggy and Congested. Right-  Boggy and  Congested.Bilateral maxillary and frontal sinus pressure.(more maxillary pain on exam) Mouth & Throat Lips: Upper Lip- Normal: no dryness, cracking, pallor, cyanosis, or vesicular eruption. Lower Lip-Normal: no dryness, cracking, pallor, cyanosis or vesicular  eruption. Buccal Mucosa- Bilateral- No Aphthous ulcers. Oropharynx- No Discharge or Erythema. Tonsils: Characteristics- Bilateral- No Erythema or Congestion. Size/Enlargement- Bilateral- No enlargement. Discharge- bilateral-None.  Neck Neck- Supple. No Masses.   Chest and Lung Exam Auscultation: Breath Sounds:-Clear even and unlabored.  Cardiovascular Auscultation:Rythm- Regular, rate and rhythm. Murmurs & Other Heart Sounds:Ausculatation of the heart reveal- No Murmurs.  Lymphatic Head & Neck General Head & Neck Lymphatics: Bilateral: Description- No Localized lymphadenopathy.  Assessment & Plan:  You appear to have a sinus infection. I am prescribing augmentin antibiotic for the infection. To help with the nasal congestion I prescribed flonase nasal steroid. For your associated cough, I prescribed cough medicine benzonatate.  Rest, hydrate, tylenol for fever.  Follow up in 7 days or as needed.  Rapid strep and flu test both negative.   Yazmina Pareja, Percell Miller, PA-C

## 2016-07-23 NOTE — Progress Notes (Signed)
Pre visit review using our clinic review tool, if applicable. No additional management support is needed unless otherwise documented below in the visit note/SLS  

## 2016-07-23 NOTE — Patient Instructions (Signed)
You appear to have a sinus infection. I am prescribing augmentin antibiotic for the infection. To help with the nasal congestion I prescribed flonase nasal steroid. For your associated cough, I prescribed cough medicine benzonatate.  Rest, hydrate, tylenol for fever.  Follow up in 7 days or as needed.

## 2016-08-04 DIAGNOSIS — Z85820 Personal history of malignant melanoma of skin: Secondary | ICD-10-CM | POA: Diagnosis not present

## 2016-08-04 DIAGNOSIS — L821 Other seborrheic keratosis: Secondary | ICD-10-CM | POA: Diagnosis not present

## 2016-08-04 DIAGNOSIS — D2261 Melanocytic nevi of right upper limb, including shoulder: Secondary | ICD-10-CM | POA: Diagnosis not present

## 2016-08-04 DIAGNOSIS — D2271 Melanocytic nevi of right lower limb, including hip: Secondary | ICD-10-CM | POA: Diagnosis not present

## 2016-08-04 DIAGNOSIS — D225 Melanocytic nevi of trunk: Secondary | ICD-10-CM | POA: Diagnosis not present

## 2016-08-12 DIAGNOSIS — D485 Neoplasm of uncertain behavior of skin: Secondary | ICD-10-CM | POA: Diagnosis not present

## 2016-08-12 DIAGNOSIS — Z85820 Personal history of malignant melanoma of skin: Secondary | ICD-10-CM | POA: Diagnosis not present

## 2016-08-12 DIAGNOSIS — L988 Other specified disorders of the skin and subcutaneous tissue: Secondary | ICD-10-CM | POA: Diagnosis not present

## 2016-09-02 DIAGNOSIS — H40013 Open angle with borderline findings, low risk, bilateral: Secondary | ICD-10-CM | POA: Diagnosis not present

## 2016-09-11 ENCOUNTER — Other Ambulatory Visit: Payer: Self-pay | Admitting: Internal Medicine

## 2016-10-11 ENCOUNTER — Other Ambulatory Visit: Payer: Self-pay | Admitting: Internal Medicine

## 2016-11-26 ENCOUNTER — Other Ambulatory Visit: Payer: Self-pay | Admitting: Internal Medicine

## 2017-01-02 ENCOUNTER — Other Ambulatory Visit: Payer: Self-pay | Admitting: Internal Medicine

## 2017-01-06 ENCOUNTER — Telehealth: Payer: Self-pay

## 2017-01-06 MED ORDER — ALLOPURINOL 300 MG PO TABS: 300.0000 mg | ORAL_TABLET | Freq: Every day | ORAL | 5 refills | Status: DC

## 2017-01-06 NOTE — Telephone Encounter (Signed)
Rx sent 

## 2017-01-06 NOTE — Telephone Encounter (Signed)
Received fax from [facility]- Pt requesting refill on allopurinol 300mg - do not see where PCP has ever filled medication before. Last OV 06/2016. Pt has follow-up scheduled 01/21/2017. Please advise.

## 2017-01-06 NOTE — Telephone Encounter (Signed)
Used to see a rheumatology but he was released from that office because he was stable. Okay refill allopurinol 6 months

## 2017-01-21 ENCOUNTER — Ambulatory Visit (INDEPENDENT_AMBULATORY_CARE_PROVIDER_SITE_OTHER): Payer: Medicare Other | Admitting: Internal Medicine

## 2017-01-21 ENCOUNTER — Encounter: Payer: Self-pay | Admitting: Internal Medicine

## 2017-01-21 VITALS — BP 124/82 | HR 89 | Temp 98.2°F | Resp 14 | Ht 72.0 in | Wt 200.2 lb

## 2017-01-21 DIAGNOSIS — R739 Hyperglycemia, unspecified: Secondary | ICD-10-CM | POA: Diagnosis not present

## 2017-01-21 DIAGNOSIS — Z23 Encounter for immunization: Secondary | ICD-10-CM | POA: Diagnosis not present

## 2017-01-21 DIAGNOSIS — M549 Dorsalgia, unspecified: Secondary | ICD-10-CM | POA: Diagnosis not present

## 2017-01-21 DIAGNOSIS — Z1159 Encounter for screening for other viral diseases: Secondary | ICD-10-CM

## 2017-01-21 DIAGNOSIS — E781 Pure hyperglyceridemia: Secondary | ICD-10-CM | POA: Diagnosis not present

## 2017-01-21 DIAGNOSIS — E039 Hypothyroidism, unspecified: Secondary | ICD-10-CM

## 2017-01-21 DIAGNOSIS — N529 Male erectile dysfunction, unspecified: Secondary | ICD-10-CM | POA: Diagnosis not present

## 2017-01-21 LAB — BASIC METABOLIC PANEL
BUN: 12 mg/dL (ref 6–23)
CO2: 28 mEq/L (ref 19–32)
Calcium: 10.1 mg/dL (ref 8.4–10.5)
Chloride: 100 mEq/L (ref 96–112)
Creatinine, Ser: 1.2 mg/dL (ref 0.40–1.50)
GFR: 63.93 mL/min (ref >60.00)
Glucose, Bld: 108 mg/dL — ABNORMAL HIGH (ref 70–99)
Potassium: 4.2 mEq/L (ref 3.5–5.1)
Sodium: 138 mEq/L (ref 135–145)

## 2017-01-21 LAB — LIPID PANEL
Cholesterol: 230 mg/dL — ABNORMAL HIGH (ref 0–200)
HDL: 77.7 mg/dL (ref >39.00)
NonHDL: 151.95
Total CHOL/HDL Ratio: 3
Triglycerides: 281 mg/dL — ABNORMAL HIGH (ref 0.0–149.0)
VLDL: 56.2 mg/dL — ABNORMAL HIGH (ref 0.0–40.0)

## 2017-01-21 LAB — HEMOGLOBIN A1C: Hgb A1c MFr Bld: 4.3 % — ABNORMAL LOW (ref 4.6–6.5)

## 2017-01-21 LAB — LDL CHOLESTEROL, DIRECT: Direct LDL: 101 mg/dL

## 2017-01-21 LAB — TSH: TSH: 1.58 u[IU]/mL (ref 0.35–4.50)

## 2017-01-21 NOTE — Progress Notes (Signed)
Pre visit review using our clinic review tool, if applicable. No additional management support is needed unless otherwise documented below in the visit note. 

## 2017-01-21 NOTE — Progress Notes (Signed)
Subjective:    Patient ID: Jeffrey Acosta, male    DOB: 01/05/1951, 66 y.o.   MRN: [id number]  DOS:  01/21/2017 Type of visit - description : rov Interval history: Bipolar: good med compliance, follow-up by psychiatry Lower back pain: Has seen Dr. Cay Schillings  within the last few months, currently on indomethacin with food twice a day, pain is manageable. E.D.: Take sildenafil correctly, up to 100 mg without any help. Alternatives? Gout: On allopurinol, rheumatology release him to my care. No recent episodes. UTS: Symptoms controlled on Flomax.  Review of Systems No nausea, vomiting, diarrhea. No blood in the stools No dysuria gross hematuria No suicidal ideas  Past Medical History:  Diagnosis Date  . Alcoholism in remission ([place])    in [place]  . Bipolar affective disorder ([place])   . Diverticulosis of colon (without mention of hemorrhage)   . GERD (gastroesophageal reflux disease)   . Gout of multiple sites   . Hiatal hernia   . Hx of migraines   . Hydrocele   . HYPERLIPIDEMIA 01/10/2009   Qualifier: Diagnosis of  By: Ronnald Ramp CMA, Chemira    NMR Lipoprofile 2011 LDL could not be calculated  Due to TG of 889  (811  / 750 ),  HDL 50 . Father MI @ 63 PGM CVA early 18s; PGF CVA @72    . Hypothyroidism   . Melanoma of skin, site unspecified 11/25/2012   07/2011 abdominal  Stage 1 Dr Amy Martinique Dr Sarajane Jews   . Osteopenia   . Polyarthralgia     Past Surgical History:  Procedure Laterality Date  . COLONOSCOPY  2003   negative ; Dr Sharlett Iles  . INGUINAL HERNIA REPAIR  08/07/2011   Procedure: HERNIA REPAIR INGUINAL ADULT;  Surgeon: Rolm Bookbinder, MD;  Location: [facility];  Service: General;  Laterality: Right;  . MELANOMA EXCISION  07/28/11   Dr Sarajane Jews; stomach and chest  . TONSILLECTOMY  1951  . 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 UPPER GASTROINTESTINAL ENDOSCOPY  1983   hiatal hernia  . VASECTOMY  1979    Social History   Social History  . Marital  status: Married    Spouse name: [name]  . Number of children: 1  . Years of education: N/A   Occupational History  . retired    Social History Main Topics  . Smoking status: Never Smoker  . Smokeless tobacco: Never Used  . Alcohol use No     Comment: former alcoholic;   in remission   . Drug use: No  . Sexual activity: Yes    Partners: Female     Comment: Having some issues with ED.  Would like provider's recommendation.     Other Topics Concern  . Not on file   Social History Narrative   1 child, 2 step children   Household- pt, wife    Leo Rod Trimble (sister)      Allergies as of 01/21/2017      Reactions   Omnipaque [iohexol]     Desc: developed hives after injection; resolved with 50 mg benadryl given to pt.      Medication List       Accurate as of 01/21/17  5:27 PM. Always use your most recent med list.          allopurinol 300 MG tablet Commonly known as:  ZYLOPRIM Take 1 tablet (300 mg total) by mouth daily.  ascorbic acid 500 MG tablet Commonly known as:  VITAMIN C Take 500 mg by mouth daily.   aspirin 81 MG tablet Take 81 mg by mouth daily.   b complex vitamins capsule Take 1 capsule by mouth daily.   colchicine 0.6 MG tablet TAKE 2 TABLETS BY MOUTH AT FIRST SIGNS OF GOUT, THEN 1 TABLET IN 1 HOUR IF NO IMPROVEMENT. CONTINUE TO TAKE 1 TABLET DAILY UNTIL RESOLVED.--patient needs office visit before any further refills   divalproex 500 MG 24 hr tablet Commonly known as:  DEPAKOTE ER Take 500 mg by mouth daily.   esomeprazole 20 MG capsule Commonly known as:  NEXIUM Take 20 mg by mouth daily before breakfast.   fluticasone 50 MCG/ACT nasal spray Commonly known as:  FLONASE SHAKE LIQUID AND USE 2 SPRAYS IN EACH NOSTRIL DAILY   indomethacin 25 MG capsule Commonly known as:  INDOCIN TK 1 C PO AFTER MEALS Q 12 H   lamoTRIgine 100 MG tablet Commonly known as:  LAMICTAL Take 100 mg by mouth daily.     levothyroxine 50 MCG tablet Commonly known as:  SYNTHROID, LEVOTHROID Take 1 tablet (50 mcg total) by mouth daily before breakfast.   naltrexone 50 MG tablet Commonly known as:  DEPADE Take 1 tablet by mouth daily.   QUEtiapine 100 MG tablet Commonly known as:  SEROQUEL Take 400 mg by mouth at bedtime.   sildenafil 20 MG tablet Commonly known as:  REVATIO TAKE 3 TO 4 TABLETS BY MOUTH DAILY AS NEEDED   tamsulosin 0.4 MG Caps capsule Commonly known as:  FLOMAX Take 1 capsule (0.4 mg total) by mouth daily after supper.            Discharge Care Instructions        Start     Ordered   01/21/17 0913  LDL cholesterol, direct     01/21/17 0913   01/21/17 0092  Basic metabolic panel     33/00/76 0902   01/21/17 0000  Lipid panel     01/21/17 0902   01/21/17 0000  Hemoglobin A1c     01/21/17 0902   01/21/17 0000  TSH     01/21/17 0902   01/21/17 0000  Hepatitis C antibody     01/21/17 0902   01/21/17 0000  Flu vaccine HIGH DOSE PF (Fluzone High dose)     01/21/17 0902   01/21/17 0000  Ambulatory referral to Urology     01/21/17 0903         Objective:   Physical Exam BP 124/82 (BP Location: Left Arm, Patient Position: Sitting, Cuff Size: Small)   Pulse 89   Temp 98.2 F (36.8 C) (Oral)   Resp 14   Ht 6' (1.829 m)   Wt 200 lb 4 oz (90.8 kg)   SpO2 98%   BMI 27.16 kg/m  General:   Well developed, well nourished . NAD.  HEENT:  Normocephalic . Face symmetric, atraumatic Lungs:  CTA B Normal respiratory effort, no intercostal retractions, no accessory muscle use. Heart: RRR,  no murmur.  No pretibial edema bilaterally  Skin: Not pale. Not jaundice Neurologic:  alert & oriented X3.  Speech normal, gait appropriate for age and unassisted Psych--  Cognition and judgment appear intact.  Cooperative with normal attention span and concentration.  Behavior appropriate. No anxious or depressed appearing.      Assessment & Plan:   Assessment  Bipolar  disorder-- Dr Toy Care, dc lithium 07-2014 EtOH abuse-- remission  (+  etoh @ ER visit 12-22-14) Hypothyroidism Hyperlipidemia E.D.  MSK: ---Back pain: Saw Dr. Ramos/Geoffrey~ 2002, 3 local injections, offered surgery.  ---Gout-- Dr Berna Bue, released to PCP 2018 Melanoma 2014 Dr. Martinique Osteopenia  T score -2.4  ( 11-2012) Vit d def dx 02-2015 GI: GERD, hiatal hernia, diverticulitis  PLAN Bipolar disorder: controlled, patient request to fax all labs to Dr. Toy Care Hypothyroidism: On Synthroid, check a TSH Hyperlipidemia: Diet control, check labs Chronic back pain: Currently on indomethacin twice a day, will check his renal function. Recommend to decrease to indomethacin once a day, if pain persists take Tylenol and a second dose of indomethacin only as needed. Continue PPIs. Gout: Last visit with rheumatology 06-2016, pt was released to my care. On allopurinol, also last uric acid was elevated but pt  remains asx. No change. L UTS: Better with Flomax ED: Failing to respond to top dose of sildenafil, request alternatives, refer to urology, MUSE? (Has used that before). Osteopenia: DEXA referral failed 2. Reassess in the future Primary care: Hep C screen, flu shot RTC 4-5 months,   CPX

## 2017-01-21 NOTE — Patient Instructions (Signed)
GO TO THE LAB : Get the blood work     GO TO THE FRONT DESK Schedule your next appointment for a physical exam in 4-5 months  

## 2017-01-21 NOTE — Assessment & Plan Note (Signed)
Bipolar disorder: controlled, patient request to fax all labs to Dr. Toy Care Hypothyroidism: On Synthroid, check a TSH Hyperlipidemia: Diet control, check labs Chronic back pain: Currently on indomethacin twice a day, will check his renal function. Recommend to decrease to indomethacin once a day, if pain persists take Tylenol and a second dose of indomethacin only as needed. Continue PPIs. Gout: Last visit with rheumatology 06-2016, pt was released to my care. On allopurinol, also last uric acid was elevated but pt  remains asx. No change. L UTS: Better with Flomax ED: Failing to respond to top dose of sildenafil, request alternatives, refer to urology, MUSE? (Has used that before). Osteopenia: DEXA referral failed 2. Reassess in the future Primary care: Hep C screen, flu shot RTC 4-5 months,   CPX

## 2017-01-22 LAB — HEPATITIS C ANTIBODY
Hepatitis C Ab: NONREACTIVE
SIGNAL TO CUT-OFF: 0 (ref <1.00)

## 2017-03-01 DIAGNOSIS — H524 Presbyopia: Secondary | ICD-10-CM | POA: Diagnosis not present

## 2017-03-01 DIAGNOSIS — H5203 Hypermetropia, bilateral: Secondary | ICD-10-CM | POA: Diagnosis not present

## 2017-03-01 DIAGNOSIS — H52223 Regular astigmatism, bilateral: Secondary | ICD-10-CM | POA: Diagnosis not present

## 2017-03-01 DIAGNOSIS — H40033 Anatomical narrow angle, bilateral: Secondary | ICD-10-CM | POA: Diagnosis not present

## 2017-03-12 DIAGNOSIS — N5201 Erectile dysfunction due to arterial insufficiency: Secondary | ICD-10-CM | POA: Diagnosis not present

## 2017-03-29 DIAGNOSIS — H40023 Open angle with borderline findings, high risk, bilateral: Secondary | ICD-10-CM | POA: Diagnosis not present

## 2017-04-04 ENCOUNTER — Other Ambulatory Visit: Payer: Self-pay | Admitting: Internal Medicine

## 2017-04-28 ENCOUNTER — Encounter: Payer: Self-pay | Admitting: Internal Medicine

## 2017-04-28 ENCOUNTER — Ambulatory Visit (INDEPENDENT_AMBULATORY_CARE_PROVIDER_SITE_OTHER): Payer: Medicare Other | Admitting: Internal Medicine

## 2017-04-28 VITALS — BP 124/84 | HR 75 | Temp 97.7°F | Resp 14 | Ht 72.0 in | Wt 197.0 lb

## 2017-04-28 DIAGNOSIS — E039 Hypothyroidism, unspecified: Secondary | ICD-10-CM

## 2017-04-28 DIAGNOSIS — Z0000 Encounter for general adult medical examination without abnormal findings: Secondary | ICD-10-CM | POA: Diagnosis not present

## 2017-04-28 DIAGNOSIS — R202 Paresthesia of skin: Secondary | ICD-10-CM

## 2017-04-28 DIAGNOSIS — F319 Bipolar disorder, unspecified: Secondary | ICD-10-CM | POA: Diagnosis not present

## 2017-04-28 DIAGNOSIS — E785 Hyperlipidemia, unspecified: Secondary | ICD-10-CM | POA: Diagnosis not present

## 2017-04-28 DIAGNOSIS — M10071 Idiopathic gout, right ankle and foot: Secondary | ICD-10-CM

## 2017-04-28 DIAGNOSIS — E781 Pure hyperglyceridemia: Secondary | ICD-10-CM

## 2017-04-28 DIAGNOSIS — M8580 Other specified disorders of bone density and structure, unspecified site: Secondary | ICD-10-CM

## 2017-04-28 LAB — CBC WITH DIFFERENTIAL/PLATELET
Basophils Absolute: 0 10*3/uL (ref 0.0–0.1)
Basophils Relative: 0.4 % (ref 0.0–3.0)
Eosinophils Absolute: 0.1 10*3/uL (ref 0.0–0.7)
Eosinophils Relative: 2.2 % (ref 0.0–5.0)
HCT: 41.4 % (ref 39.0–52.0)
Hemoglobin: 14.4 g/dL (ref 13.0–17.0)
Lymphocytes Relative: 27.2 % (ref 12.0–46.0)
Lymphs Abs: 0.9 10*3/uL (ref 0.7–4.0)
MCHC: 34.8 g/dL (ref 30.0–36.0)
MCV: 104.8 fl — ABNORMAL HIGH (ref 78.0–100.0)
Monocytes Absolute: 0.6 10*3/uL (ref 0.1–1.0)
Monocytes Relative: 17.7 % — ABNORMAL HIGH (ref 3.0–12.0)
Neutro Abs: 1.8 10*3/uL (ref 1.4–7.7)
Neutrophils Relative %: 52.5 % (ref 43.0–77.0)
Platelets: 154 10*3/uL (ref 150.0–400.0)
RBC: 3.95 Mil/uL — ABNORMAL LOW (ref 4.22–5.81)
RDW: 13.9 % (ref 11.5–15.5)
WBC: 3.5 10*3/uL — ABNORMAL LOW (ref 4.0–10.5)

## 2017-04-28 LAB — AST: AST: 42 U/L — ABNORMAL HIGH (ref 0–37)

## 2017-04-28 LAB — PSA: PSA: 1.63 ng/mL (ref 0.10–4.00)

## 2017-04-28 LAB — URIC ACID: Uric Acid, Serum: 4.1 mg/dL (ref 4.0–7.8)

## 2017-04-28 LAB — BASIC METABOLIC PANEL
BUN: 13 mg/dL (ref 6–23)
CO2: 28 mEq/L (ref 19–32)
Calcium: 9 mg/dL (ref 8.4–10.5)
Chloride: 104 mEq/L (ref 96–112)
Creatinine, Ser: 1.11 mg/dL (ref 0.40–1.50)
GFR: 69.9 mL/min (ref >60.00)
Glucose, Bld: 94 mg/dL (ref 70–99)
Potassium: 3.9 mEq/L (ref 3.5–5.1)
Sodium: 142 mEq/L (ref 135–145)

## 2017-04-28 LAB — ALT: ALT: 35 U/L (ref 0–53)

## 2017-04-28 NOTE — Assessment & Plan Note (Addendum)
-  Tdap: 2014; prevnar-- 2016; PNA shot 23- 2009; had a flu shot  shingrix discussed -CCS: Last cscope  2015. Some Diverticulosis , Dr Henrene Pastor, 10 years  -Prostate cancer screening: DRE normal today, check a PSA -Diet exercise discussed.  He is actually doing well, exercising 3 times a week. -Labs: BMP, AST, ALT, CBC, PSA, uric acid, Depakote level

## 2017-04-28 NOTE — Progress Notes (Signed)
Subjective:    Patient ID: Jeffrey Acosta, male    DOB: 1950-05-14, 67 y.o.   MRN: [id number]  DOS:  04/28/2017 Type of visit - description : cpx Interval history: In general feeling well, no major concerns, good compliance with medication   Review of Systems occ has a  tingling feeling in all toes, mostly when he lays down in bed.  Symptoms are not persistent, no back pain at the present time, no bladder or bowel incontinence.  Other than above, a 14 point review of systems is negative    Past Medical History:  Diagnosis Date  . Alcoholism in remission ([place])    in [place]  . Bipolar affective disorder ([place])   . Diverticulosis of colon (without mention of hemorrhage)   . GERD (gastroesophageal reflux disease)   . Gout of multiple sites   . Hiatal hernia   . Hx of migraines   . Hydrocele   . HYPERLIPIDEMIA 01/10/2009   Qualifier: Diagnosis of  By: Ronnald Ramp CMA, Chemira    NMR Lipoprofile 2011 LDL could not be calculated  Due to TG of 889  (811  / 750 ),  HDL 50 . Father MI @ 67 PGM CVA early 54s; PGF CVA @72    . Hypothyroidism   . Melanoma of skin, site unspecified 11/25/2012   07/2011 abdominal  Stage 1 Dr Amy Martin[place] Dr Sarajane Jews   . Osteopenia   . Polyarthralgia     Past Surgical History:  Procedure Laterality Date  . COLONOSCOPY  2003   negative ; Dr Sharlett Iles  . INGUINAL HERNIA REPAIR  08/07/2011   Procedure: HERNIA REPAIR INGUINAL ADULT;  Surgeon: Rolm Bookbinder, MD;  Location: [facility];  Service: General;  Laterality: Right;  . MELANOMA EXCISION  07/28/11   Dr Sarajane Jews; stomach and chest  . TONSILLECTOMY  1951  . 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 UPPER GASTROINTESTINAL ENDOSCOPY  1983   hiatal hernia  . VASECTOMY  1979    Social History   Socioeconomic History  . Marital status: Married    Spouse name: [name]  . Number of children: 1  . Years of education: Not on file  . Highest education level: Not on file  Social Needs  . Financial  resource strain: Not on file  . Food insecurity - worry: Not on file  . Food insecurity - inability: Not on file  . Transportation needs - medical: Not on file  . Transportation needs - non-medical: Not on file  Occupational History  . Occupation: retired  Tobacco Use  . Smoking status: Never Smoker  . Smokeless tobacco: Never Used  Substance and Sexual Activity  . Alcohol use: No    Alcohol/week: 0.0 oz    Comment: former alcoholic;   in remission   . Drug use: No  . Sexual activity: Yes    Partners: Female    Comment: Having some issues with ED.  Would like provider's recommendation.    Other Topics Concern  . Not on file  Social History Narrative   1 child, 2 step children   Household- pt, wife    Leo Rod [place] (sister)     Family History  Problem Relation Age of Onset  . COPD Father   . Heart attack Father 51  . COPD Mother   . Diabetes Paternal Grandmother   . Parkinsonism Paternal Grandmother   . Stroke Paternal Grandmother 61  .  Stroke Maternal Grandfather 72  . Lung cancer Paternal Grandfather        Heavy smoker  . Colon cancer Neg Hx   . Prostate cancer Neg Hx      Allergies as of 04/28/2017      Reactions   Omnipaque [iohexol]     Desc: developed hives after injection; resolved with 50 mg benadryl given to pt.      Medication List        Accurate as of 04/28/17  4:51 PM. Always use your most recent med list.          allopurinol 300 MG tablet Commonly known as:  ZYLOPRIM Take 1 tablet (300 mg total) by mouth daily.   ascorbic acid 500 MG tablet Commonly known as:  VITAMIN C Take 500 mg by mouth daily.   aspirin 81 MG tablet Take 81 mg by mouth daily.   b complex vitamins capsule Take 1 capsule by mouth daily.   colchicine 0.6 MG tablet TAKE 2 TABLETS BY MOUTH AT FIRST SIGNS OF GOUT, THEN 1 TABLET IN 1 HOUR IF NO IMPROVEMENT. CONTINUE TO TAKE 1 TABLET DAILY UNTIL RESOLVED.--patient needs office visit before  any further refills   divalproex 500 MG 24 hr tablet Commonly known as:  DEPAKOTE ER Take 500 mg by mouth daily.   esomeprazole 20 MG capsule Commonly known as:  NEXIUM Take 20 mg by mouth daily before breakfast.   indomethacin 25 MG capsule Commonly known as:  INDOCIN TK 1 C PO AFTER MEALS Q 12 H   lamoTRIgine 100 MG tablet Commonly known as:  LAMICTAL Take 100 mg by mouth daily.   levothyroxine 50 MCG tablet Commonly known as:  SYNTHROID, LEVOTHROID TAKE 1 TABLET BY MOUTH DAILY BEFORE BREAKFAST   naltrexone 50 MG tablet Commonly known as:  DEPADE Take 1 tablet by mouth daily.   QUEtiapine 100 MG tablet Commonly known as:  SEROQUEL Take 400 mg by mouth at bedtime.   sildenafil 20 MG tablet Commonly known as:  REVATIO TAKE 3 TO 4 TABLETS BY MOUTH DAILY AS NEEDED   tamsulosin 0.4 MG Caps capsule Commonly known as:  FLOMAX Take 1 capsule (0.4 mg total) by mouth daily after supper.          Objective:   Physical Exam BP 124/84 (BP Location: Left Arm, Patient Position: Sitting, Cuff Size: Small)   Pulse 75   Temp 97.7 F (36.5 C) (Oral)   Resp 14   Ht 6' (1.829 m)   Wt 197 lb (89.4 kg)   SpO2 97%   BMI 26.72 kg/m  General:   Well developed, well nourished . NAD.  Neck: No  thyromegaly  HEENT:  Normocephalic . Face symmetric, atraumatic Lungs:  CTA B Normal respiratory effort, no intercostal retractions, no accessory muscle use. Heart: RRR,  no murmur.  No pretibial edema bilaterally  Abdomen:  Not distended, soft, non-tender. No rebound or rigidity.   Skin: Exposed areas without rash. Not pale. Not jaundice Rectal:  External abnormalities: none. Normal sphincter tone. No rectal masses or tenderness.  Stool brown  Prostate: Prostate gland firm and smooth, no enlargement, nodularity, tenderness, mass, asymmetry or induration.  Neurologic:  alert & oriented X3.  Speech normal, gait appropriate for age and unassisted Strength symmetric and  appropriate for age.  Psych: Cognition and judgment appear intact.  Cooperative with normal attention span and concentration.  Behavior appropriate. No anxious or depressed appearing.     Assessment & Plan:  Assessment  Bipolar disorder-- Dr Toy Care, dc lithium 07-2014 EtOH abuse-- remission  (+ etoh @ ER visit 12-22-14) Hypothyroidism Hyperlipidemia E.D.  MSK: ---Back pain: Saw Dr. Ramos/Geoffrey~ 2002, 3 local injections, offered surgery.  ---Gout-- Dr Berna Bue, released to PCP 2018 Melanoma 2014 Dr. Martinique Osteopenia  T score -2.4  ( 11-2012) Vit d def dx 02-2015 GI: GERD, hiatal hernia, diverticulitis LUTS   PLAN Bipolar: Sxs controlled, on Depakote among other meds, checking levels. EtOH: On remission Hypothyroidism: On Synthroid, last TSH satisfactory Hyperlipidemia: Diet controlled, checking labs Gout: On allopurinol, no recent attacks, check a uric acid. H/o melanoma: states sees  dermatology regularly Osteopenia:  revious DEXA referrals failed, last vitamin D levels normal. Paresthesias: Last vitamin B12 was normal, sx mild per pt , observation L UTS: Well-controlled on Flomax.  No change. RTC 6 months

## 2017-04-28 NOTE — Patient Instructions (Signed)
GO TO THE LAB : Get the blood work     GO TO THE FRONT DESK Schedule your next appointment for a  routine checkup in 6 months  

## 2017-04-28 NOTE — Assessment & Plan Note (Signed)
Bipolar: Sxs controlled, on Depakote among other meds, checking levels. EtOH: On remission Hypothyroidism: On Synthroid, last TSH satisfactory Hyperlipidemia: Diet controlled, checking labs Gout: On allopurinol, no recent attacks, check a uric acid. H/o melanoma: states sees  dermatology regularly Osteopenia:  revious DEXA referrals failed, last vitamin D levels normal. Paresthesias: Last vitamin B12 was normal, sx mild per pt , observation L UTS: Well-controlled on Flomax.  No change. RTC 6 months

## 2017-04-28 NOTE — Progress Notes (Signed)
Pre visit review using our clinic review tool, if applicable. No additional management support is needed unless otherwise documented below in the visit note. 

## 2017-04-29 LAB — VALPROIC ACID LEVEL: Valproic Acid Lvl: 33.3 mg/L — ABNORMAL LOW (ref 50.0–100.0)

## 2017-07-29 DIAGNOSIS — H5340 Unspecified visual field defects: Secondary | ICD-10-CM | POA: Diagnosis not present

## 2017-07-29 DIAGNOSIS — H401111 Primary open-angle glaucoma, right eye, mild stage: Secondary | ICD-10-CM | POA: Diagnosis not present

## 2017-08-04 DIAGNOSIS — D225 Melanocytic nevi of trunk: Secondary | ICD-10-CM | POA: Diagnosis not present

## 2017-08-04 DIAGNOSIS — Z85820 Personal history of malignant melanoma of skin: Secondary | ICD-10-CM | POA: Diagnosis not present

## 2017-08-04 DIAGNOSIS — L821 Other seborrheic keratosis: Secondary | ICD-10-CM | POA: Diagnosis not present

## 2017-08-04 DIAGNOSIS — L720 Epidermal cyst: Secondary | ICD-10-CM | POA: Diagnosis not present

## 2017-10-07 DIAGNOSIS — H401111 Primary open-angle glaucoma, right eye, mild stage: Secondary | ICD-10-CM | POA: Diagnosis not present

## 2017-10-27 ENCOUNTER — Ambulatory Visit: Payer: Medicare Other | Admitting: Internal Medicine

## 2017-10-27 ENCOUNTER — Encounter: Payer: Self-pay | Admitting: Internal Medicine

## 2017-10-27 VITALS — BP 134/68 | HR 102 | Temp 98.1°F | Resp 16 | Ht 72.0 in | Wt 187.0 lb

## 2017-10-27 DIAGNOSIS — F1010 Alcohol abuse, uncomplicated: Secondary | ICD-10-CM | POA: Diagnosis not present

## 2017-10-27 DIAGNOSIS — E039 Hypothyroidism, unspecified: Secondary | ICD-10-CM | POA: Diagnosis not present

## 2017-10-27 NOTE — Progress Notes (Signed)
Pre visit review using our clinic review tool, if applicable. No additional management support is needed unless otherwise documented below in the visit note. 

## 2017-10-27 NOTE — Progress Notes (Signed)
Subjective:    Patient ID: Jeffrey Acosta, male    DOB: Nov 18, 1950, 67 y.o.   MRN: [id number]  DOS:  10/27/2017 Type of visit - description : ROV Interval history: Since the last office visit was doing well until 6 weeks ago when he started to drink again. He is not taking his medications regularly lately. Has been "dry" for the last 3 days Has increased his interaction with AA and his sponsor.   Review of Systems Denies any suicidal ideas No fever chills No nausea, vomiting, diarrhea Complained of some pressure in the ears, would like that to be checked.  See physical exam  Past Medical History:  Diagnosis Date  . Alcoholism in remission ([place])    in [place]  . Bipolar affective disorder ([place])   . Diverticulosis of colon (without mention of hemorrhage)   . GERD (gastroesophageal reflux disease)   . Gout of multiple sites   . Hiatal hernia   . Hx of migraines   . Hydrocele   . HYPERLIPIDEMIA 01/10/2009   Qualifier: Diagnosis of  By: Ronnald Ramp CMA, Chemira    NMR Lipoprofile 2011 LDL could not be calculated  Due to TG of 889  (811  / 750 ),  HDL 50 . Father MI @ 74 PGM CVA early 26s; PGF CVA @72    . Hypothyroidism   . Melanoma of skin, site unspecified 11/25/2012   07/2011 abdominal  Stage 1 Dr Amy Martin[place] Dr Sarajane Jews   . Osteopenia   . Polyarthralgia     Past Surgical History:  Procedure Laterality Date  . COLONOSCOPY  2003   negative ; Dr Sharlett Iles  . INGUINAL HERNIA REPAIR  08/07/2011   Procedure: HERNIA REPAIR INGUINAL ADULT;  Surgeon: Rolm Bookbinder, MD;  Location: [facility];  Service: General;  Laterality: Right;  . MELANOMA EXCISION  07/28/11   Dr Sarajane Jews; stomach and chest  . TONSILLECTOMY  1951  . 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 UPPER GASTROINTESTINAL ENDOSCOPY  1983   hiatal hernia  . VASECTOMY  1979    Social History   Socioeconomic History  . Marital status: Married    Spouse name: [name]  . Number of children: 1  . Years of  education: Not on file  . Highest education level: Not on file  Occupational History  . Occupation: retired  Scientific laboratory technician  . Financial resource strain: Not on file  . Food insecurity:    Worry: Not on file    Inability: Not on file  . Transportation needs:    Medical: Not on file    Non-medical: Not on file  Tobacco Use  . Smoking status: Never Smoker  . Smokeless tobacco: Never Used  Substance and Sexual Activity  . Alcohol use: No    Alcohol/week: 0.0 oz    Comment: former alcoholic;   in remission   . Drug use: No  . Sexual activity: Yes    Partners: Female    Comment: Having some issues with ED.  Would like provider's recommendation.    Lifestyle  . Physical activity:    Days per week: Not on file    Minutes per session: Not on file  . Stress: Not on file  Relationships  . Social connections:    Talks on phone: Not on file    Gets together: Not on file    Attends religious service: Not on file    Active member of club or organization: Not  on file    Attends meetings of clubs or organizations: Not on file    Relationship status: Not on file  . Intimate partner violence:    Fear of current or ex partner: Not on file    Emotionally abused: Not on file    Physically abused: Not on file    Forced sexual activity: Not on file  Other Topics Concern  . Not on file  Social History Narrative   1 child, 2 step children   Household- pt, wife    Leo Rod [place] (sister)      Allergies as of 10/27/2017      Reactions   Omnipaque [iohexol]     Desc: developed hives after injection; resolved with 50 mg benadryl given to pt.      Medication List        Accurate as of 10/27/17  6:02 PM. Always use your most recent med list.          allopurinol 300 MG tablet Commonly known as:  ZYLOPRIM Take 1 tablet (300 mg total) by mouth daily.   ascorbic acid 500 MG tablet Commonly known as:  VITAMIN C Take 500 mg by mouth daily.   aspirin 81  MG tablet Take 81 mg by mouth daily.   b complex vitamins capsule Take 1 capsule by mouth daily.   colchicine 0.6 MG tablet TAKE 2 TABLETS BY MOUTH AT FIRST SIGNS OF GOUT, THEN 1 TABLET IN 1 HOUR IF NO IMPROVEMENT. CONTINUE TO TAKE 1 TABLET DAILY UNTIL RESOLVED.--patient needs office visit before any further refills   divalproex 500 MG 24 hr tablet Commonly known as:  DEPAKOTE ER Take 500 mg by mouth daily.   esomeprazole 20 MG capsule Commonly known as:  NEXIUM Take 20 mg by mouth daily before breakfast.   indomethacin 25 MG capsule Commonly known as:  INDOCIN TK 1 C PO AFTER MEALS Q 12 H   lamoTRIgine 100 MG tablet Commonly known as:  LAMICTAL Take 100 mg by mouth daily.   levothyroxine 50 MCG tablet Commonly known as:  SYNTHROID, LEVOTHROID TAKE 1 TABLET BY MOUTH DAILY BEFORE BREAKFAST   naltrexone 50 MG tablet Commonly known as:  DEPADE Take 1 tablet by mouth daily.   QUEtiapine 100 MG tablet Commonly known as:  SEROQUEL Take 400 mg by mouth at bedtime.   sildenafil 20 MG tablet Commonly known as:  REVATIO TAKE 3 TO 4 TABLETS BY MOUTH DAILY AS NEEDED   tamsulosin 0.4 MG Caps capsule Commonly known as:  FLOMAX Take 1 capsule (0.4 mg total) by mouth daily after supper.          Objective:   Physical Exam BP 134/68 (BP Location: Left Arm, Patient Position: Sitting, Cuff Size: Small)   Pulse (!) 102   Temp 98.1 F (36.7 C) (Oral)   Resp 16   Ht 6' (1.829 m)   Wt 187 lb (84.8 kg)   SpO2 97%   BMI 25.36 kg/m  General:   Well developed, NAD, see BMI.  HEENT:  Normocephalic . Face symmetric, atraumatic. Left ear: Normal, right ear: Mild amount of wax noted Lungs:  CTA B Normal respiratory effort, no intercostal retractions, no accessory muscle use. Heart: RRR,  no murmur.  No pretibial edema bilaterally  Skin: Not pale. Not jaundice Neurologic:  alert & oriented X3.  Speech normal, gait appropriate for age and unassisted Psych--  Cognition and  judgment appear intact.  Cooperative with  normal attention span and concentration.  Behavior appropriate. No anxious or depressed appearing.      Assessment & Plan:    Assessment  Bipolar disorder-- Dr Toy Care, dc lithium 07-2014 EtOH abuse-- remission w/ occ relapse Hypothyroidism Hyperlipidemia E.D.  MSK: ---Back pain: Saw Dr. Ramos/Geoffrey~ 2002, 3 local injections, offered surgery.  ---Gout-- Dr Berna Bue, released to PCP 2018 Melanoma 2014 Dr. Martinique Osteopenia  T score -2.4  ( 11-2012) Vit d def dx 02-2015 GI: GERD, hiatal hernia, diverticulitis LUTS   PLAN EtOH: relapse for the last 6 weeks, dry for 3 days, patient was counseled to the best of my ability.  I offered to contact his psychiatrist but he declined, states he will call.  We had a long conversation about this issue, I supported him the best I could.  I encouraged him to continue with frequent AA visits and contacting his a sponsor.  He knows to call anytime if needed. Hypothyroidism: Not taking  medications regularly, recheck on RTC RTC 4 to 5 weeks

## 2017-10-27 NOTE — Patient Instructions (Signed)
  GO TO THE FRONT DESK Schedule your next appointment for a  Check up in 4-5 weeks

## 2017-10-27 NOTE — Assessment & Plan Note (Signed)
EtOH: relapse for the last 6 weeks, dry for 3 days, patient was counseled to the best of my ability.  I offered to contact his psychiatrist but he declined, states he will call.  We had a long conversation about this issue, I supported him the best I could.  I encouraged him to continue with frequent AA visits and contacting his a sponsor.  He knows to call anytime if needed. Hypothyroidism: Not taking  medications regularly, recheck on RTC RTC 4 to 5 weeks

## 2017-11-09 LAB — FECAL OCCULT BLOOD, IMMUNOCHEMICAL: IFOBT: NEGATIVE

## 2017-11-10 DIAGNOSIS — Z0000 Encounter for general adult medical examination without abnormal findings: Secondary | ICD-10-CM | POA: Diagnosis not present

## 2017-11-23 ENCOUNTER — Telehealth: Payer: Self-pay | Admitting: *Deleted

## 2017-11-23 NOTE — Telephone Encounter (Signed)
Received Lab Report Fit Test results from [facility]; forwarded to provider/SLS 07/30

## 2017-11-26 ENCOUNTER — Ambulatory Visit: Payer: Medicare Other | Admitting: Internal Medicine

## 2017-11-26 ENCOUNTER — Encounter: Payer: Self-pay | Admitting: Internal Medicine

## 2017-11-26 VITALS — BP 152/84 | HR 103 | Temp 98.2°F | Resp 14 | Ht 72.0 in | Wt 190.1 lb

## 2017-11-26 DIAGNOSIS — F1010 Alcohol abuse, uncomplicated: Secondary | ICD-10-CM | POA: Diagnosis not present

## 2017-11-26 DIAGNOSIS — E039 Hypothyroidism, unspecified: Secondary | ICD-10-CM

## 2017-11-26 DIAGNOSIS — F319 Bipolar disorder, unspecified: Secondary | ICD-10-CM | POA: Diagnosis not present

## 2017-11-26 NOTE — Progress Notes (Signed)
Pre visit review using our clinic review tool, if applicable. No additional management support is needed unless otherwise documented below in the visit note. 

## 2017-11-26 NOTE — Progress Notes (Signed)
Subjective:    Patient ID: Jeffrey Acosta, male    DOB: [birth year], 67 y.o.   MRN: [id number]  DOS:  11/26/2017 Type of visit - description : f/u Interval history: Since the last office visit he is doing better, has been sober for 28 days. BP today is a slightly elevated, no ambulatory BPs Reports good compliance with all bipolar medications.   Review of Systems Goes to [employer] 8-9 times a week. Sees his sponsor regularly and has become a sponsor for a younger man.  Past Medical History:  Diagnosis Date  . Alcoholism in remission ([place])    in [place]  . Bipolar affective disorder ([place])   . Diverticulosis of colon (without mention of hemorrhage)   . GERD (gastroesophageal reflux disease)   . Gout of multiple sites   . Hiatal hernia   . Hx of migraines   . Hydrocele   . HYPERLIPIDEMIA 01/10/2009   Qualifier: Diagnosis of  By: Ronnald Ramp CMA, Chemira    NMR Lipoprofile 2011 LDL could not be calculated  Due to TG of 889  (811  / 750 ),  HDL 50 . Father MI @ 59 PGM CVA early 62s; PGF CVA @72    . Hypothyroidism   . Melanoma of skin, site unspecified 11/25/2012   07/2011 abdominal  Stage 1 Dr Amy Martinique Dr Sarajane Jews   . Osteopenia   . Polyarthralgia     Past Surgical History:  Procedure Laterality Date  . COLONOSCOPY  2003   negative ; Dr Sharlett Iles  . INGUINAL HERNIA REPAIR  08/07/2011   Procedure: HERNIA REPAIR INGUINAL ADULT;  Surgeon: Rolm Bookbinder, MD;  Location: [facility];  Service: General;  Laterality: Right;  . MELANOMA EXCISION  07/28/11   Dr Sarajane Jews; stomach and chest  . TONSILLECTOMY  1951  . 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 UPPER GASTROINTESTINAL ENDOSCOPY  1983   hiatal hernia  . VASECTOMY  1979    Social History   Socioeconomic History  . Marital status: Married    Spouse name: [name]  . Number of children: 1  . Years of education: Not on file  . Highest education level: Not on file  Occupational History  . Occupation: retired    Scientific laboratory technician  . Financial resource strain: Not on file  . Food insecurity:    Worry: Not on file    Inability: Not on file  . Transportation needs:    Medical: Not on file    Non-medical: Not on file  Tobacco Use  . Smoking status: Never Smoker  . Smokeless tobacco: Never Used  Substance and Sexual Activity  . Alcohol use: No    Alcohol/week: 0.0 oz    Comment: former alcoholic;   in remission   . Drug use: No  . Sexual activity: Yes    Partners: Female    Comment: Having some issues with ED.  Would like provider's recommendation.    Lifestyle  . Physical activity:    Days per week: Not on file    Minutes per session: Not on file  . Stress: Not on file  Relationships  . Social connections:    Talks on phone: Not on file    Gets together: Not on file    Attends religious service: Not on file    Active member of club or organization: Not on file    Attends meetings of clubs or organizations: Not on file    Relationship  status: Not on file  . Intimate partner violence:    Fear of current or ex partner: Not on file    Emotionally abused: Not on file    Physically abused: Not on file    Forced sexual activity: Not on file  Other Topics Concern  . Not on file  Social History Narrative   1 child, 2 step children   Household- pt, wife    Leo Rod [place] (sister)      Allergies as of 11/26/2017      Reactions   Omnipaque [iohexol]     Desc: developed hives after injection; resolved with 50 mg benadryl given to pt.      Medication List        Accurate as of 11/26/17 10:04 AM. Always use your most recent med list.          allopurinol 300 MG tablet Commonly known as:  ZYLOPRIM Take 1 tablet (300 mg total) by mouth daily.   ascorbic acid 500 MG tablet Commonly known as:  VITAMIN C Take 500 mg by mouth daily.   aspirin 81 MG tablet Take 81 mg by mouth daily.   b complex vitamins capsule Take 1 capsule by mouth daily.    colchicine 0.6 MG tablet TAKE 2 TABLETS BY MOUTH AT FIRST SIGNS OF GOUT, THEN 1 TABLET IN 1 HOUR IF NO IMPROVEMENT. CONTINUE TO TAKE 1 TABLET DAILY UNTIL RESOLVED.--patient needs office visit before any further refills   divalproex 500 MG 24 hr tablet Commonly known as:  DEPAKOTE ER Take 500 mg by mouth daily.   esomeprazole 20 MG capsule Commonly known as:  NEXIUM Take 20 mg by mouth daily before breakfast.   indomethacin 25 MG capsule Commonly known as:  INDOCIN TK 1 C PO AFTER MEALS Q 12 H   lamoTRIgine 100 MG tablet Commonly known as:  LAMICTAL Take 100 mg by mouth daily.   levothyroxine 50 MCG tablet Commonly known as:  SYNTHROID, LEVOTHROID TAKE 1 TABLET BY MOUTH DAILY BEFORE BREAKFAST   naltrexone 50 MG tablet Commonly known as:  DEPADE Take 1 tablet by mouth daily.   QUEtiapine 100 MG tablet Commonly known as:  SEROQUEL Take 400 mg by mouth at bedtime.   sildenafil 20 MG tablet Commonly known as:  REVATIO TAKE 3 TO 4 TABLETS BY MOUTH DAILY AS NEEDED   tamsulosin 0.4 MG Caps capsule Commonly known as:  FLOMAX Take 1 capsule (0.4 mg total) by mouth daily after supper.          Objective:   Physical Exam BP (!) 152/84 (BP Location: Left Arm, Patient Position: Sitting, Cuff Size: Small)   Pulse (!) 103   Temp 98.2 F (36.8 C) (Oral)   Resp 14   Ht 6' (1.829 m)   Wt 190 lb 2 oz (86.2 kg)   SpO2 97%   BMI 25.79 kg/m  General:   Well developed, looks better physically and emotionally. HEENT:  Normocephalic . Face symmetric, atraumatic Lungs:  CTA B Normal respiratory effort, no intercostal retractions, no accessory muscle use. Heart: RRR,  no murmur.  No pretibial edema bilaterally  Skin: Not pale. Not jaundice Neurologic:  alert & oriented X3.  Speech normal, gait appropriate for age and unassisted Psych--  Cognition and judgment appear intact.  Cooperative with normal attention span and concentration.  Behavior appropriate. No anxious or  depressed appearing.      Assessment & Plan:   Assessment  Bipolar disorder-- Dr Toy Care, dc lithium 07-2014 EtOH abuse-- remission w/ occ relapse Hypothyroidism Hyperlipidemia E.D.  MSK: ---Back pain: Saw Dr. Ramos/Geoffrey~ 2002, 3 local injections, offered surgery.  ---Gout-- Dr Berna Bue, released to PCP 2018 Melanoma 2014 Dr. Martinique Osteopenia  T score -2.4  ( 11-2012) Vit d def dx 02-2015 GI: GERD, hiatal hernia, diverticulitis LUTS   PLAN Bipolar: Reports good compliance of medication, symptoms controlled EtOH: Sober for 28 days, goes frequently to [employer]. Praised! Will call the office as needed. Bipolar: Good compliance with medication, he reports (and seems to be)  doing very well emotionally. Hypothyroidism: Still not taking medications regularly, plans to get his prescription today.  Check TSH on RTC BP slightly elevated, recommend to check at the pharmacy 1 or 2 times a week.  Goal for systolic BP 364-383  RTC 3 months, sooner if needed.

## 2017-11-26 NOTE — Patient Instructions (Signed)
  GO TO THE FRONT DESK Schedule your next appointment for a  Check up in 3 months, sooner if needed

## 2017-11-27 NOTE — Assessment & Plan Note (Signed)
Bipolar: Reports good compliance of medication, symptoms controlled EtOH: Sober for 28 days, goes frequently to [employer]. Praised! Will call the office as needed. Bipolar: Good compliance with medication, he reports (and seems to be)  doing very well emotionally. Hypothyroidism: Still not taking medications regularly, plans to get his prescription today.  Check TSH on RTC BP slightly elevated, recommend to check at the pharmacy 1 or 2 times a week.  Goal for systolic BP 147-829  RTC 3 months, sooner if needed.

## 2017-12-08 ENCOUNTER — Telehealth: Payer: Self-pay

## 2017-12-08 NOTE — Telephone Encounter (Signed)
Typed letter received in envelope from Irish Elders- Pt's sister- informing that Pt has been off bipolar meds for several months and is continuing to drink at a heavy rate. He has had 2 falls recently. Will place this letter in PCP red folder for review on return to office.

## 2017-12-15 NOTE — Telephone Encounter (Signed)
I reviewed the letter: -He is not taking his bipolar medications for months - Not eating regulalrly -Continue drinking - Driving when he should not - Pass out for few hours at least once - They fear about his safety and the safety of others Asked a call from Dr. Toy Care,  I like to talk to her before I call the family.

## 2017-12-15 NOTE — Telephone Encounter (Signed)
Spoke w/ Dr Toy Care, she received the same letter and actually had already office visit with the patient. The patient and his wife were not aware of the letter. Jeffrey Acosta reported that he has been cutting back on his medication and still drinking some alcohol. He was offered a rehab program but he declined, he agreed with Dr. Raliegh Ip to take medications as prescribed and will call for possible rehab for EtOH if he feels that is what he needs. No further action needed from my side.

## 2018-02-08 ENCOUNTER — Other Ambulatory Visit: Payer: Self-pay | Admitting: Internal Medicine

## 2018-03-02 DIAGNOSIS — H52223 Regular astigmatism, bilateral: Secondary | ICD-10-CM | POA: Diagnosis not present

## 2018-03-02 DIAGNOSIS — H5203 Hypermetropia, bilateral: Secondary | ICD-10-CM | POA: Diagnosis not present

## 2018-03-02 DIAGNOSIS — H524 Presbyopia: Secondary | ICD-10-CM | POA: Diagnosis not present

## 2018-03-02 DIAGNOSIS — H401112 Primary open-angle glaucoma, right eye, moderate stage: Secondary | ICD-10-CM | POA: Diagnosis not present

## 2018-03-03 ENCOUNTER — Encounter: Payer: Self-pay | Admitting: Internal Medicine

## 2018-03-03 ENCOUNTER — Ambulatory Visit: Payer: Medicare Other | Admitting: Internal Medicine

## 2018-03-03 VITALS — BP 126/64 | HR 70 | Temp 97.9°F | Resp 16 | Ht 72.0 in | Wt 193.5 lb

## 2018-03-03 DIAGNOSIS — M10071 Idiopathic gout, right ankle and foot: Secondary | ICD-10-CM | POA: Diagnosis not present

## 2018-03-03 DIAGNOSIS — E781 Pure hyperglyceridemia: Secondary | ICD-10-CM | POA: Diagnosis not present

## 2018-03-03 DIAGNOSIS — M109 Gout, unspecified: Secondary | ICD-10-CM

## 2018-03-03 DIAGNOSIS — Z23 Encounter for immunization: Secondary | ICD-10-CM | POA: Diagnosis not present

## 2018-03-03 DIAGNOSIS — E039 Hypothyroidism, unspecified: Secondary | ICD-10-CM

## 2018-03-03 DIAGNOSIS — F1021 Alcohol dependence, in remission: Secondary | ICD-10-CM | POA: Diagnosis not present

## 2018-03-03 DIAGNOSIS — F319 Bipolar disorder, unspecified: Secondary | ICD-10-CM | POA: Diagnosis not present

## 2018-03-03 LAB — LIPID PANEL
Cholesterol: 219 mg/dL — ABNORMAL HIGH (ref 0–200)
HDL: 64.4 mg/dL (ref >39.00)
NonHDL: 154.84
Total CHOL/HDL Ratio: 3
Triglycerides: 307 mg/dL — ABNORMAL HIGH (ref 0.0–149.0)
VLDL: 61.4 mg/dL — ABNORMAL HIGH (ref 0.0–40.0)

## 2018-03-03 LAB — COMPREHENSIVE METABOLIC PANEL
ALT: 10 U/L (ref 0–53)
AST: 21 U/L (ref 0–37)
Albumin: 4.2 g/dL (ref 3.5–5.2)
Alkaline Phosphatase: 40 U/L (ref 39–117)
BUN: 23 mg/dL (ref 6–23)
CO2: 29 mEq/L (ref 19–32)
Calcium: 9.2 mg/dL (ref 8.4–10.5)
Chloride: 103 mEq/L (ref 96–112)
Creatinine, Ser: 1.25 mg/dL (ref 0.40–1.50)
GFR: 60.79 mL/min (ref >60.00)
Glucose, Bld: 109 mg/dL — ABNORMAL HIGH (ref 70–99)
Potassium: 4.2 mEq/L (ref 3.5–5.1)
Sodium: 140 mEq/L (ref 135–145)
Total Bilirubin: 1.3 mg/dL — ABNORMAL HIGH (ref 0.2–1.2)
Total Protein: 6.1 g/dL (ref 6.0–8.3)

## 2018-03-03 LAB — LDL CHOLESTEROL, DIRECT: Direct LDL: 129 mg/dL

## 2018-03-03 LAB — TSH: TSH: 3.01 u[IU]/mL (ref 0.35–4.50)

## 2018-03-03 MED ORDER — ZOSTER VAC RECOMB ADJUVANTED 50 MCG/0.5ML IM SUSR: 0.5000 mL | Freq: Once | INTRAMUSCULAR | 1 refills | Status: AC

## 2018-03-03 NOTE — Patient Instructions (Signed)
GO TO THE LAB : Get the blood work     GO TO THE FRONT DESK Schedule your next appointment for a  Physical exam in 4 months

## 2018-03-03 NOTE — Progress Notes (Signed)
Pre visit review using our clinic review tool, if applicable. No additional management support is needed unless otherwise documented below in the visit note. 

## 2018-03-03 NOTE — Assessment & Plan Note (Signed)
Bipolar: Sees psychiatry regularly, will forward labs results. Continue Depakote, Lamictal, naltrexone and Seroquel. EtOH: Sober for 89 days, goes to [employer] 9 times a week, sponsor 1 person. Praised! Hypothyroidism: Continue Synthroid, check a TSH Hyperlipidemia: Diet controlled, fasting, checking labs Elevated BP, see last visit, ambulatory BPs normal Gout: On allopurinol, check a CMP, no recent episodes, has not needed colchicine or Indocin. Preventive care: Had a flu shot, PNM 23 today, Shingrix printed. RTC 4 months CPX

## 2018-03-03 NOTE — Progress Notes (Signed)
Subjective:    Patient ID: Jeffrey Acosta, male    DOB: [birth year], 67 y.o.   MRN: [id number]  DOS:  11/7/2019rov  Type of visit - description : Routine visit Interval history: In general doing well. EtOH: Sober, going to [employer] regularly. Elevated BP: Checking BPs at home, they are normal. Gout: No recent episodes. Hypothyroidism: Good med compliance, due for labs Bipolar: Good compliance with medications.   Review of Systems No  chest pain or difficulty breathing No nausea, vomiting, diarrhea  Past Medical History:  Diagnosis Date  . Alcoholism in remission ([place])    in [place]  . Bipolar affective disorder ([place])   . Diverticulosis of colon (without mention of hemorrhage)   . GERD (gastroesophageal reflux disease)   . Gout of multiple sites   . Hiatal hernia   . Hx of migraines   . Hydrocele   . HYPERLIPIDEMIA 01/10/2009   Qualifier: Diagnosis of  By: Ronnald Ramp CMA, Chemira    NMR Lipoprofile 2011 LDL could not be calculated  Due to TG of 889  (811  / 750 ),  HDL 50 . Father MI @ 76 PGM CVA early 68s; PGF CVA @72    . Hypothyroidism   . Melanoma of skin, site unspecified 11/25/2012   07/2011 abdominal  Stage 1 Dr Amy Martinique Dr Sarajane Jews   . Osteopenia   . Polyarthralgia     Past Surgical History:  Procedure Laterality Date  . COLONOSCOPY  2003   negative ; Dr Sharlett Iles  . INGUINAL HERNIA REPAIR  08/07/2011   Procedure: HERNIA REPAIR INGUINAL ADULT;  Surgeon: Rolm Bookbinder, MD;  Location: [facility];  Service: General;  Laterality: Right;  . MELANOMA EXCISION  07/28/11   Dr Sarajane Jews; stomach and chest  . TONSILLECTOMY  1951  . 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 UPPER GASTROINTESTINAL ENDOSCOPY  1983   hiatal hernia  . VASECTOMY  1979    Social History   Socioeconomic History  . Marital status: Married    Spouse name: [name]  . Number of children: 1  . Years of education: Not on file  . Highest education level: Not on file    Occupational History  . Occupation: retired  Scientific laboratory technician  . Financial resource strain: Not on file  . Food insecurity:    Worry: Not on file    Inability: Not on file  . Transportation needs:    Medical: Not on file    Non-medical: Not on file  Tobacco Use  . Smoking status: Never Smoker  . Smokeless tobacco: Never Used  Substance and Sexual Activity  . Alcohol use: No    Alcohol/week: 0.0 standard drinks    Comment: former alcoholic;   in remission   . Drug use: No  . Sexual activity: Yes    Partners: Female    Comment: Having some issues with ED.  Would like provider's recommendation.    Lifestyle  . Physical activity:    Days per week: Not on file    Minutes per session: Not on file  . Stress: Not on file  Relationships  . Social connections:    Talks on phone: Not on file    Gets together: Not on file    Attends religious service: Not on file    Active member of club or organization: Not on file    Attends meetings of clubs or organizations: Not on file    Relationship status: Not  on file  . Intimate partner violence:    Fear of current or ex partner: Not on file    Emotionally abused: Not on file    Physically abused: Not on file    Forced sexual activity: Not on file  Other Topics Concern  . Not on file  Social History Narrative   1 child, 2 step children   Household- pt, wife    Leo Rod [place] (sister)      Allergies as of 03/03/2018      Reactions   Omnipaque [iohexol]     Desc: developed hives after injection; resolved with 50 mg benadryl given to pt.      Medication List        Accurate as of 03/03/18  4:59 PM. Always use your most recent med list.          allopurinol 300 MG tablet Commonly known as:  ZYLOPRIM Take 1 tablet (300 mg total) by mouth daily.   aspirin 81 MG tablet Take 81 mg by mouth daily.   b complex vitamins capsule Take 1 capsule by mouth daily.   colchicine 0.6 MG tablet TAKE 2  TABLETS BY MOUTH AT FIRST SIGNS OF GOUT, THEN 1 TABLET IN 1 HOUR IF NO IMPROVEMENT. CONTINUE TO TAKE 1 TABLET DAILY UNTIL RESOLVED.--patient needs office visit before any further refills   divalproex 500 MG 24 hr tablet Commonly known as:  DEPAKOTE ER Take 500 mg by mouth daily.   esomeprazole 20 MG capsule Commonly known as:  NEXIUM Take 20 mg by mouth daily before breakfast.   indomethacin 25 MG capsule Commonly known as:  INDOCIN TK 1 C PO AFTER MEALS Q 12 H   lamoTRIgine 100 MG tablet Commonly known as:  LAMICTAL Take 100 mg by mouth daily.   levothyroxine 50 MCG tablet Commonly known as:  SYNTHROID, LEVOTHROID TAKE 1 TABLET BY MOUTH DAILY BEFORE BREAKFAST   naltrexone 50 MG tablet Commonly known as:  DEPADE Take 1 tablet by mouth daily.   QUEtiapine 100 MG tablet Commonly known as:  SEROQUEL Take 400 mg by mouth at bedtime.   sildenafil 20 MG tablet Commonly known as:  REVATIO TAKE 3 TO 4 TABLETS BY MOUTH DAILY AS NEEDED   tamsulosin 0.4 MG Caps capsule Commonly known as:  FLOMAX Take 1 capsule (0.4 mg total) by mouth daily after supper.   Zoster Vaccine Adjuvanted injection Commonly known as:  SHINGRIX Inject 0.5 mLs into the muscle once for 1 dose.          Objective:   Physical Exam BP 126/64 (BP Location: Left Arm, Patient Position: Sitting, Cuff Size: Normal)   Pulse 70   Temp 97.9 F (36.6 C) (Oral)   Resp 16   Ht 6' (1.829 m)   Wt 193 lb 8 oz (87.8 kg)   SpO2 94%   BMI 26.24 kg/m  General:   Well developed, NAD, BMI noted. HEENT:  Normocephalic . Face symmetric, atraumatic Lungs:  CTA B Normal respiratory effort, no intercostal retractions, no accessory muscle use. Heart: RRR,  no murmur.  No pretibial edema bilaterally  Skin: Not pale. Not jaundice Neurologic:  alert & oriented X3.  Speech normal, gait appropriate for age and unassisted Psych--  Cognition and judgment appear intact.  Cooperative with normal attention span and  concentration.  Behavior appropriate. No anxious or depressed appearing.      Assessment & Plan:   Assessment  Bipolar disorder-- Dr  Toy Care, dc lithium 07-2014 EtOH abuse-- remission w/ occ relapse Hypothyroidism Hyperlipidemia E.D.  MSK: ---Back pain: Saw Dr. Ramos/Geoffrey~ 2002, 3 local injections, offered surgery.  ---Gout-- Dr Berna Bue, released to PCP 2018 Melanoma 2014 Dr. Martinique Osteopenia  T score -2.4  ( 11-2012) Vit d def dx 02-2015 GI: GERD, hiatal hernia, diverticulitis LUTS   PLAN Bipolar: Sees psychiatry regularly, will forward labs results. Continue Depakote, Lamictal, naltrexone and Seroquel. EtOH: Sober for 89 days, goes to [employer] 9 times a week, sponsor 1 person. Praised! Hypothyroidism: Continue Synthroid, check a TSH Hyperlipidemia: Diet controlled, fasting, checking labs Elevated BP, see last visit, ambulatory BPs normal Gout: On allopurinol, check a CMP, no recent episodes, has not needed colchicine or Indocin. Preventive care: Had a flu shot, PNM 23 today, Shingrix printed. RTC 4 months CPX

## 2018-03-07 DIAGNOSIS — H2513 Age-related nuclear cataract, bilateral: Secondary | ICD-10-CM | POA: Diagnosis not present

## 2018-03-07 DIAGNOSIS — H401131 Primary open-angle glaucoma, bilateral, mild stage: Secondary | ICD-10-CM | POA: Diagnosis not present

## 2018-03-08 MED ORDER — ATORVASTATIN CALCIUM 20 MG PO TABS: 20.0000 mg | ORAL_TABLET | Freq: Every day | ORAL | 3 refills | Status: DC

## 2018-03-08 NOTE — Addendum Note (Signed)
Addended by: Damita Dunnings D on: 03/08/2018 10:00 AM   Modules accepted: Orders

## 2018-05-10 ENCOUNTER — Other Ambulatory Visit: Payer: Self-pay | Admitting: Internal Medicine

## 2018-05-10 ENCOUNTER — Other Ambulatory Visit (INDEPENDENT_AMBULATORY_CARE_PROVIDER_SITE_OTHER): Payer: Medicare Other

## 2018-05-10 DIAGNOSIS — E781 Pure hyperglyceridemia: Secondary | ICD-10-CM | POA: Diagnosis not present

## 2018-05-10 LAB — LIPID PANEL
Cholesterol: 193 mg/dL (ref 0–200)
HDL: 67.5 mg/dL (ref >39.00)
NonHDL: 125.89
Total CHOL/HDL Ratio: 3
Triglycerides: 382 mg/dL — ABNORMAL HIGH (ref 0.0–149.0)
VLDL: 76.4 mg/dL — ABNORMAL HIGH (ref 0.0–40.0)

## 2018-05-10 LAB — ALT: ALT: 12 U/L (ref 0–53)

## 2018-05-10 LAB — LDL CHOLESTEROL, DIRECT: Direct LDL: 79 mg/dL

## 2018-05-10 LAB — AST: AST: 26 U/L (ref 0–37)

## 2018-05-11 DIAGNOSIS — Z0120 Encounter for dental examination and cleaning without abnormal findings: Secondary | ICD-10-CM | POA: Diagnosis not present

## 2018-05-12 MED ORDER — ATORVASTATIN CALCIUM 20 MG PO TABS: 20.0000 mg | ORAL_TABLET | Freq: Every day | ORAL | 3 refills | Status: DC

## 2018-05-12 NOTE — Addendum Note (Signed)
Addended byDamita Dunnings D on: 05/12/2018 11:13 AM   Modules accepted: Orders

## 2018-06-13 DIAGNOSIS — W19XXXA Unspecified fall, initial encounter: Secondary | ICD-10-CM | POA: Diagnosis not present

## 2018-06-13 DIAGNOSIS — T148XXA Other injury of unspecified body region, initial encounter: Secondary | ICD-10-CM | POA: Diagnosis not present

## 2018-07-04 ENCOUNTER — Other Ambulatory Visit: Payer: Self-pay | Admitting: Emergency Medicine

## 2018-07-04 ENCOUNTER — Encounter: Payer: Self-pay | Admitting: Internal Medicine

## 2018-07-04 ENCOUNTER — Ambulatory Visit (INDEPENDENT_AMBULATORY_CARE_PROVIDER_SITE_OTHER): Payer: Medicare Other | Admitting: Internal Medicine

## 2018-07-04 ENCOUNTER — Other Ambulatory Visit (INDEPENDENT_AMBULATORY_CARE_PROVIDER_SITE_OTHER): Payer: Medicare Other

## 2018-07-04 VITALS — BP 122/68 | HR 94 | Temp 97.8°F | Resp 16 | Ht 72.0 in | Wt 196.1 lb

## 2018-07-04 DIAGNOSIS — F1021 Alcohol dependence, in remission: Secondary | ICD-10-CM | POA: Diagnosis not present

## 2018-07-04 DIAGNOSIS — D61818 Other pancytopenia: Secondary | ICD-10-CM

## 2018-07-04 DIAGNOSIS — E559 Vitamin D deficiency, unspecified: Secondary | ICD-10-CM

## 2018-07-04 DIAGNOSIS — D619 Aplastic anemia, unspecified: Secondary | ICD-10-CM

## 2018-07-04 DIAGNOSIS — F319 Bipolar disorder, unspecified: Secondary | ICD-10-CM

## 2018-07-04 DIAGNOSIS — E039 Hypothyroidism, unspecified: Secondary | ICD-10-CM | POA: Diagnosis not present

## 2018-07-04 DIAGNOSIS — M8580 Other specified disorders of bone density and structure, unspecified site: Secondary | ICD-10-CM

## 2018-07-04 DIAGNOSIS — Z0000 Encounter for general adult medical examination without abnormal findings: Secondary | ICD-10-CM

## 2018-07-04 DIAGNOSIS — M10071 Idiopathic gout, right ankle and foot: Secondary | ICD-10-CM | POA: Diagnosis not present

## 2018-07-04 DIAGNOSIS — E781 Pure hyperglyceridemia: Secondary | ICD-10-CM

## 2018-07-04 LAB — BASIC METABOLIC PANEL WITH GFR
BUN: 16 mg/dL (ref 6–23)
CO2: 28 meq/L (ref 19–32)
Calcium: 9.3 mg/dL (ref 8.4–10.5)
Chloride: 101 meq/L (ref 96–112)
Creatinine, Ser: 1.16 mg/dL (ref 0.40–1.50)
GFR: 62.29 mL/min
Glucose, Bld: 84 mg/dL (ref 70–99)
Potassium: 4 meq/L (ref 3.5–5.1)
Sodium: 141 meq/L (ref 135–145)

## 2018-07-04 LAB — FOLATE: Folate: 10.9 ng/mL (ref >5.9)

## 2018-07-04 LAB — CBC WITH DIFFERENTIAL/PLATELET
Basophils Absolute: 0 10*3/uL (ref 0.0–0.1)
Basophils Relative: 0.7 % (ref 0.0–3.0)
Eosinophils Absolute: 0 10*3/uL (ref 0.0–0.7)
Eosinophils Relative: 0.8 % (ref 0.0–5.0)
HCT: 35.8 % — ABNORMAL LOW (ref 39.0–52.0)
Hemoglobin: 12.8 g/dL — ABNORMAL LOW (ref 13.0–17.0)
Lymphocytes Relative: 28.3 % (ref 12.0–46.0)
Lymphs Abs: 0.8 10*3/uL (ref 0.7–4.0)
MCHC: 35.8 g/dL (ref 30.0–36.0)
MCV: 105.6 fl — ABNORMAL HIGH (ref 78.0–100.0)
Monocytes Absolute: 0.3 10*3/uL (ref 0.1–1.0)
Monocytes Relative: 9.3 % (ref 3.0–12.0)
Neutro Abs: 1.7 10*3/uL (ref 1.4–7.7)
Neutrophils Relative %: 60.9 % (ref 43.0–77.0)
Platelets: 132 10*3/uL — ABNORMAL LOW (ref 150.0–400.0)
RBC: 3.39 Mil/uL — ABNORMAL LOW (ref 4.22–5.81)
RDW: 15.5 % (ref 11.5–15.5)
WBC: 2.8 10*3/uL — ABNORMAL LOW (ref 4.0–10.5)

## 2018-07-04 LAB — URIC ACID: Uric Acid, Serum: 8.1 mg/dL — ABNORMAL HIGH (ref 4.0–7.8)

## 2018-07-04 LAB — TSH: TSH: 1.65 u[IU]/mL (ref 0.35–4.50)

## 2018-07-04 LAB — VITAMIN D 25 HYDROXY (VIT D DEFICIENCY, FRACTURES): VITD: 44.2 ng/mL (ref 30.00–100.00)

## 2018-07-04 LAB — VITAMIN B12: Vitamin B-12: 356 pg/mL (ref 211–911)

## 2018-07-04 NOTE — Progress Notes (Signed)
Subjective:    Patient ID: Jeffrey Acosta, male    DOB: 1950/10/01, 68 y.o.   MRN: [id number]  DOS:  07/04/2018 Type of visit - description: Complete physical exam In general feels well, daily, full, see below  Review of Systems On 06/12/2018, he was running up a set of concrete  stairs, had a fall, injured his right abdomen, developed ecchymosis and a lump. Went to urgent care at [facility], at the time the area of impact was hurting severely, since then pain has decreased. Denies any head or neck injury.  No hip injury, no bleeding No nausea, vomiting, diarrhea  Other than above, a 14 point review of systems is negative    Past Medical History:  Diagnosis Date  . Alcoholism in remission ([place])    in [place]  . Bipolar affective disorder ([place])   . Diverticulosis of colon (without mention of hemorrhage)   . GERD (gastroesophageal reflux disease)   . Gout of multiple sites   . Hiatal hernia   . Hx of migraines   . Hydrocele   . HYPERLIPIDEMIA 01/10/2009   Qualifier: Diagnosis of  By: Ronnald Ramp CMA, Chemira    NMR Lipoprofile 2011 LDL could not be calculated  Due to TG of 889  (811  / 750 ),  HDL 50 . Father MI @ 81 PGM CVA early 38s; PGF CVA @72    . Hypothyroidism   . Melanoma of skin, site unspecified 11/25/2012   07/2011 abdominal  Stage 1 Dr Amy Martinique Dr Sarajane Jews   . Osteopenia   . Polyarthralgia     Past Surgical History:  Procedure Laterality Date  . COLONOSCOPY  2003   negative ; Dr Sharlett Iles  . INGUINAL HERNIA REPAIR  08/07/2011   Procedure: HERNIA REPAIR INGUINAL ADULT;  Surgeon: Rolm Bookbinder, MD;  Location: [place] [facility];  Service: General;  Laterality: Right;  . MELANOMA EXCISION  07/28/11   Dr Sarajane Jews; stomach and chest  . TONSILLECTOMY  1951  . 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 UPPER GASTROINTESTINAL ENDOSCOPY  1983   hiatal hernia  . VASECTOMY  1979    Social History   Socioeconomic History  . Marital status: Married    Spouse name: Jeffrey Acosta   . Number of children: 1  . Years of education: Not on file  . Highest education level: Not on file  Occupational History  . Occupation: retired  Scientific laboratory technician  . Financial resource strain: Not on file  . Food insecurity:    Worry: Not on file    Inability: Not on file  . Transportation needs:    Medical: Not on file    Non-medical: Not on file  Tobacco Use  . Smoking status: Never Smoker  . Smokeless tobacco: Never Used  Substance and Sexual Activity  . Alcohol use: No    Alcohol/week: 0.0 standard drinks    Comment: former alcoholic;   in remission   . Drug use: No  . Sexual activity: Yes    Partners: Female    Comment: Having some issues with ED.  Would like provider's recommendation.    Lifestyle  . Physical activity:    Days per week: Not on file    Minutes per session: Not on file  . Stress: Not on file  Relationships  . Social connections:    Talks on phone: Not on file    Gets together: Not on file    Attends religious service: Not on  file    Active member of club or organization: Not on file    Attends meetings of clubs or organizations: Not on file    Relationship status: Not on file  . Intimate partner violence:    Fear of current or ex partner: Not on file    Emotionally abused: Not on file    Physically abused: Not on file    Forced sexual activity: Not on file  Other Topics Concern  . Not on file  Social History Narrative   1 child, 2 step children   Household- pt, wife    Jeffrey Acosta (sister)     Family History  Problem Relation Age of Onset  . COPD Father   . Heart attack Father 48  . COPD Mother   . Diabetes Paternal Grandmother   . Parkinsonism Paternal Grandmother   . Stroke Paternal Grandmother 36  . Stroke Maternal Grandfather 72  . Lung cancer Paternal Grandfather        Heavy smoker  . Colon cancer Neg Hx   . Prostate cancer Neg Hx      Allergies as of 07/04/2018      Reactions   Omnipaque  [iohexol]     Desc: developed hives after injection; resolved with 50 mg benadryl given to pt.      Medication List       Accurate as of July 04, 2018 11:59 PM. Always use your most recent med list.        allopurinol 300 MG tablet Commonly known as:  ZYLOPRIM Take 1 tablet (300 mg total) by mouth daily.   aspirin 81 MG tablet Take 81 mg by mouth daily.   atorvastatin 20 MG tablet Commonly known as:  LIPITOR Take 1 tablet (20 mg total) by mouth at bedtime.   b complex vitamins capsule Take 1 capsule by mouth daily.   colchicine 0.6 MG tablet TAKE 2 TABLETS BY MOUTH AT FIRST SIGNS OF GOUT, THEN 1 TABLET IN 1 HOUR IF NO IMPROVEMENT. CONTINUE TO TAKE 1 TABLET DAILY UNTIL RESOLVED.--patient needs office visit before any further refills   divalproex 500 MG 24 hr tablet Commonly known as:  DEPAKOTE ER Take 500 mg by mouth daily.   esomeprazole 20 MG capsule Commonly known as:  NEXIUM Take 20 mg by mouth daily before breakfast.   indomethacin 25 MG capsule Commonly known as:  INDOCIN TK 1 C PO AFTER MEALS Q 12 H   lamoTRIgine 100 MG tablet Commonly known as:  LAMICTAL Take 100 mg by mouth daily.   levothyroxine 50 MCG tablet Commonly known as:  SYNTHROID, LEVOTHROID Take 1 tablet (50 mcg total) by mouth daily before breakfast.   naltrexone 50 MG tablet Commonly known as:  DEPADE Take 1 tablet by mouth daily.   QUEtiapine 100 MG tablet Commonly known as:  SEROQUEL Take 400 mg by mouth at bedtime.   sildenafil 20 MG tablet Commonly known as:  REVATIO TAKE 3 TO 4 TABLETS BY MOUTH DAILY AS NEEDED   tamsulosin 0.4 MG Caps capsule Commonly known as:  FLOMAX Take 1 capsule (0.4 mg total) by mouth daily after supper.           Objective:   Physical Exam Abdominal:      BP 122/68 (BP Location: Left Arm, Patient Position: Sitting, Cuff Size: Normal)   Pulse 94   Temp 97.8 F (36.6 C) (Oral)   Resp 16   Ht 6' (1.829  m)   Wt 196 lb 2 oz (89 kg)   SpO2  93%   BMI 26.60 kg/m  General: Well developed, NAD, BMI noted Neck: No  thyromegaly  HEENT:  Normocephalic . Face symmetric, atraumatic Lungs:  CTA B Normal respiratory effort, no intercostal retractions, no accessory muscle use. Heart: RRR, soft systolic murmur? No pretibial edema bilaterally  Abdomen:  Not distended, soft, non-tender. No rebound or rigidity.   Skin: Exposed areas without rash. Not pale. Not jaundice Neurologic:  alert & oriented X3.  Speech normal, gait appropriate for age and unassisted Strength symmetric and appropriate for age.  Psych: Cognition and judgment appear intact.  Cooperative with normal attention span and concentration.  Behavior appropriate. No anxious or depressed appearing.     Assessment    Assessment  Bipolar disorder-- Dr Toy Care, dc lithium 07-2014 EtOH abuse-- remission w/ occ relapse Hypothyroidism Hyperlipidemia E.D.  MSK: ---Back pain: Saw Dr. Ramos/Geoffrey~ 2002, 3 local injections, offered surgery.  ---Gout-- Dr Berna Bue, released to PCP 2018 Melanoma 2014 Dr. Martinique Osteopenia  T score -2.4  ( 11-2012) Vit d def dx 02-2015 GI: GERD, hiatal hernia, diverticulitis LUTS   PLAN Bipolar: Follow-up by Dr. Toy Care , checking Depakote levels, will forward all results to her EtOH abuse: Remains sober.  Praised! Hypothyroidism: On Synthroid, check a TSH Hyperlipidemia: On Lipitor, last FLP and LFTs satisfactory.  No change H/o melanoma, reports sees dermatology regularly Osteopenia: No recent DEXA, ordering one, on OTC vitamin D. Vitamin D deficiency: Checking labs LUTS: Controlled on Flomax. Hematoma, ecchymoses: Should resolve over time, patient is somewhat concerned, recheck in 3 months Soft systolic murmur?  Recheck on RTC Addendum: See CBC, WBCs, hemoglobin and platelets are low, developing pancytopenia?    Depakote toxicity? We will add-redraw: peripheral blood smear,  J88 folic acid.  Refer to hematology, pt called and  aware  Unable to add PT and PTT. Will call Dr Toy Care once Depakote levels are back. RTC 3 months

## 2018-07-04 NOTE — Progress Notes (Signed)
Pre visit review using our clinic review tool, if applicable. No additional management support is needed unless otherwise documented below in the visit note. 

## 2018-07-04 NOTE — Patient Instructions (Addendum)
Please schedule Medicare Wellness with Glenard Haring.   GO TO THE LAB : Get the blood work     GO TO THE FRONT DESK Schedule your next appointment for a check up in 3 months

## 2018-07-04 NOTE — Assessment & Plan Note (Signed)
-  Tdap: 2014; prevnar-- 2016; PNA shot 23- 2009; had a flu shot; shingrix discussed, plans to get it at the pharmacy -CCS: Last cscope  2015. Some Diverticulosis , Dr Henrene Pastor, 10 years  -Prostate cancer screening: DRE -PSA wnl 2019 -Diet exercise discussed.   exercising 3 times a week. Discussed diet  -Labs: BMP, AST, ALT, CBC, PSA, uric acid, Depakote level

## 2018-07-05 ENCOUNTER — Telehealth: Payer: Self-pay | Admitting: Internal Medicine

## 2018-07-05 LAB — PATHOLOGIST SMEAR REVIEW

## 2018-07-05 LAB — VALPROIC ACID LEVEL: Valproic Acid Lvl: 21.2 mg/L — ABNORMAL LOW (ref 50.0–100.0)

## 2018-07-05 NOTE — Telephone Encounter (Signed)
Spoke w/ Pt- informed of recommendations. Pt verbalized understanding. Med list updated.  °

## 2018-07-05 NOTE — Assessment & Plan Note (Signed)
Bipolar: Follow-up by Dr. Toy Care , checking Depakote levels, will forward all results to her EtOH abuse: Remains sober.  Praised! Hypothyroidism: On Synthroid, check a TSH Hyperlipidemia: On Lipitor, last FLP and LFTs satisfactory.  No change H/o melanoma, reports sees dermatology regularly Osteopenia: No recent DEXA, ordering one, on OTC vitamin D. Vitamin D deficiency: Checking labs LUTS: Controlled on Flomax. Hematoma, ecchymoses: Should resolve over time, patient is somewhat concerned, recheck in 3 months Soft systolic murmur?  Recheck on RTC Addendum: See CBC, WBCs, hemoglobin and platelets are low, developing pancytopenia?    Depakote toxicity? We will add-redraw: peripheral blood smear,  J40 folic acid.  Refer to hematology, pt called and aware  Unable to add PT and PTT. Will call Dr Toy Care once Depakote levels are back. RTC 3 months

## 2018-07-05 NOTE — Telephone Encounter (Signed)
Results discussed with Dr. Toy Care, she agrees that mild pancytopenia could be Depakote toxicity. Advised patient: Jeffrey Acosta to stop Depakote.   Watch for worsening symptoms, we could increase Seroquel up to 800 mg daily if needed.  Recommend to contact me or Dr Toy Care if needed. Follow-up in 3 months

## 2018-07-07 ENCOUNTER — Telehealth: Payer: Self-pay | Admitting: Hematology

## 2018-07-07 NOTE — Telephone Encounter (Signed)
Spoke with patient to confirm new patient appt 3/24 at 0830. Pt aware of date/time/location

## 2018-07-13 ENCOUNTER — Other Ambulatory Visit (HOSPITAL_BASED_OUTPATIENT_CLINIC_OR_DEPARTMENT_OTHER): Payer: Self-pay

## 2018-07-15 ENCOUNTER — Other Ambulatory Visit: Payer: Self-pay | Admitting: Hematology

## 2018-07-15 DIAGNOSIS — D72819 Decreased white blood cell count, unspecified: Secondary | ICD-10-CM | POA: Insufficient documentation

## 2018-07-15 DIAGNOSIS — D539 Nutritional anemia, unspecified: Secondary | ICD-10-CM

## 2018-07-15 DIAGNOSIS — D696 Thrombocytopenia, unspecified: Secondary | ICD-10-CM | POA: Insufficient documentation

## 2018-07-15 NOTE — Progress Notes (Signed)
[facility] NOTE  Patient Care Team: Colon Branch, MD as PCP - General (Internal Medicine) Chucky May, MD as Consulting Physician (Psychiatry) Kathrynn Ducking, MD as Consulting Physician (Neurology) Tat, Eustace Quail, DO as Consulting Physician (Neurology) Marica Otter, [place] as Consulting Physician (Optometry) Irene Shipper, MD as Consulting Physician (Gastroenterology) Demetrius Revel, DDS as Consulting Physician (Dentistry) Gavin Pound, MD as Consulting Physician (Rheumatology) Martinique, Amy, MD as Consulting Physician (Dermatology)  HEME/ONC OVERVIEW: 1. Macrocytic anemia 2. Leukopenia 3. Thrombocytopenia  ASSESSMENT & PLAN:   Macrocytic anemia -I reviewed the patient's records in detail, including PCP clinic notes and lab studies -In summary, patient has had intermittent mild microcytic anemia dating back to 2017.  He has a history of heavy EtOH abuse with occasional relapses. Folate level was normal and B12 was 356 in 06/2018. Colonoscopy in 2015 was normal except mild diverticulosis. -I personally reviewed the patient's peripheral blood smear, which showed mildly enlarged RBC's with some scattered reticulocytes, indicating increased RBC production, as well as occasional stomatocytes; there was no schistocytosis  -Hgb 13.8 today, improving since 06/2018  -Patient denies any symptoms of bleeding or bruising -Given the borderline line low B12 level, I have ordered methylmalonic acid level as well as iron level to rule out any concurrent iron deficiency -In light of the patient's history of EtOH abuse and hypothyroidism, I suspect macrocytic anemia in the most likely multifactorial -I have prescribed oral B12 supplement 1063mg daily, given the recent borderline low B12 level  -Given the improving Hgb, I do not see any indication for bone marrow biopsy at this time  Leukopenia -Reviewed of the patient CBC's dating back to 2017 showed WBC fluctuating  between mildly low and low normal (WBC 3-5k's) -Clinically, patient denies any symptoms of infection or constitutional symptoms -WBC 4.2k today, improving since 06/2018 -I personally reviewed the peripheral blood smear, which showed normal WBC morphology with slight left shift, no dysplastic changes or circulating blasts -I have ordered nutritional studies, including zinc and copper, as well as infectious studies, including HIV, Hep B and C serologies -As leukopenia has resolved, I suspect that it is in part due to Depakote, which was recently discontinued; furthermore, I discussed with the patient that EtOH use can cause bone marrow suppression and lead to cytopenias  -I counseled the patient on any concerning symptoms, such as unexplained fever, weight loss, night sweats, lymphadenopathy, for which he should contact the clinic promptly  Thrombocytopenia -Review of the patient's CBC's showed mostly normal plts with occasional mild thrombocytopenia (plts ~100k) -No platelet clumping on peripheral blood smear, occasional giant platelets noted -Infectious and nutritional studies as outlined above -Given the patient's hx of EtOH abuse, I have ordered abdominal [place] assess for any liver cirrhosis and/or splenomegaly  -Patient is on Depakote, which can be associated with mild thrombocytopenia; Depakote was discontinued in mid-06/2018, and may take some time before plt count begins to recover -I counseled the patient on any concerning symptoms, such as abnormal bleeding or bruising, for which he should seek care promptly  Intermittent EtOH abuse -Patient reports that he has reduced his alcohol use significantly, but still drinks approximately 2-3 times per month, each time 4-5 shots of liquor -I counseled the patient on the importance of abstinence from alcohol, as alcohol can cause liver dysfunction and bone marrow toxicity, leading to cytopenias -Patient expressed understanding, and agreed with the  plan  Orders Placed This Encounter  Procedures  . UKoreaAbdomen Complete  Standing Status:   Future    Standing Expiration Date:   07/15/2019    Order Specific Question:   Reason for Exam (SYMPTOM  OR DIAGNOSIS REQUIRED)    Answer:   Thrombocytopenia, hx of EtOH abuse, rule out cirrhosis and splenomegaly    Order Specific Question:   Preferred imaging location?    Answer:   [facility]  . CBC with Differential (Cancer Center Only)    Standing Status:   Future    Standing Expiration Date:   08/23/2019  . CMP ([facility] only)    Standing Status:   Future    Standing Expiration Date:   08/23/2019  . Save Smear (SSMR)    Standing Status:   Future    Standing Expiration Date:   07/19/2019  . Lactate dehydrogenase    Standing Status:   Future    Standing Expiration Date:   08/23/2019  . Vitamin B12    Standing Status:   Future    Standing Expiration Date:   08/23/2019   All questions were answered. The patient knows to call the clinic with any problems, questions or concerns.  Return in 2 months for labs and clinic follow-up.  Tish Men, MD 07/19/2018 11:02 AM   CHIEF COMPLAINTS/PURPOSE OF CONSULTATION:  "I am told my blood counts are low *"  HISTORY OF PRESENTING ILLNESS:  Jeffrey Acosta 68 y.o. male is here because of recent pancytopenia.  Patient reports that he has been on Depakote for some time as well as Seroquel for his bipolar disorder, and his PCP has been monitoring his blood counts.  His labs in 06/2018 were notable for mild pancytopenia, for which he was referred to hematology for further evaluation.  The patient reports that he recently fell on some concrete steps, and developed a right lower quadrant bruise with "knot" in the right lower quadrant.  He was seen by urgent care, who recommended supportive care, including warm compress and the PRN Tylenol.  Overall, the bruise is slowly improving, and the "knot" has been relatively stable in size.  He denies any  associated tenderness.  He has history of alcohol abuse and has had some intermittent relapses.  He currently drinks approximately 2-3 times per month, 4-5 shots of liquor each time, but does not drink in between episodes.  He has been on Depakote and Seroquel for his bipolar disorder, but he is a psychiatrist recently stopped his Depakote approximately 1 and half weeks ago due to "high level".  He otherwise denies any constitutional symptoms, chest pain, dyspnea, abdominal pain, nausea, vomiting, diarrhea, abnormal bleeding or excess bruising.  MEDICAL HISTORY:  Past Medical History:  Diagnosis Date  . Alcoholism in remission ([place])    in [place]  . Bipolar affective disorder ([place])   . Diverticulosis of colon (without mention of hemorrhage)   . GERD (gastroesophageal reflux disease)   . Gout of multiple sites   . Hiatal hernia   . Hx of migraines   . Hydrocele   . HYPERLIPIDEMIA 01/10/2009   Qualifier: Diagnosis of  By: Ronnald Ramp CMA, [place]    NMR Lipoprofile 2011 LDL could not be calculated  Due to TG of 889  (811  / 750 ),  HDL 50 . Father MI @ 6 PGM CVA early 65s; PGF CVA '@72'$    . Hypothyroidism   . Melanoma of skin, site unspecified 11/25/2012   07/2011 abdominal  Stage 1 Dr Amy Martin[place] Dr Sarajane Jews   . Osteopenia   .  Polyarthralgia     SURGICAL HISTORY: Past Surgical History:  Procedure Laterality Date  . COLONOSCOPY  2003   negative ; Dr Sharlett Iles  . INGUINAL HERNIA REPAIR  08/07/2011   Procedure: HERNIA REPAIR INGUINAL ADULT;  Surgeon: Rolm Bookbinder, MD;  Location: [facility];  Service: General;  Laterality: Right;  . MELANOMA EXCISION  07/28/11   Dr Sarajane Jews; stomach and chest  . TONSILLECTOMY  1951  . 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 UPPER GASTROINTESTINAL ENDOSCOPY  1983   hiatal hernia  . VASECTOMY  1979    SOCIAL HISTORY: Social History   Socioeconomic History  . Marital status: Married    Spouse name: [name]  . Number of children: 1  .  Years of education: Not on file  . Highest education level: Not on file  Occupational History  . Occupation: retired  Scientific laboratory technician  . Financial resource strain: Not on file  . Food insecurity:    Worry: Not on file    Inability: Not on file  . Transportation needs:    Medical: Not on file    Non-medical: Not on file  Tobacco Use  . Smoking status: Never Smoker  . Smokeless tobacco: Never Used  Substance and Sexual Activity  . Alcohol use: No    Alcohol/week: 0.0 standard drinks    Comment: former alcoholic;   in remission   . Drug use: No  . Sexual activity: Yes    Partners: Female    Comment: Having some issues with ED.  Would like provider's recommendation.    Lifestyle  . Physical activity:    Days per week: Not on file    Minutes per session: Not on file  . Stress: Not on file  Relationships  . Social connections:    Talks on phone: Not on file    Gets together: Not on file    Attends religious service: Not on file    Active member of club or organization: Not on file    Attends meetings of clubs or organizations: Not on file    Relationship status: Not on file  . Intimate partner violence:    Fear of current or ex partner: Not on file    Emotionally abused: Not on file    Physically abused: Not on file    Forced sexual activity: Not on file  Other Topics Concern  . Not on file  Social History Narrative   1 child, 2 step children   Household- pt, wife    Leo [place] (sister)    FAMILY HISTORY: Family History  Problem Relation Age of Onset  . COPD Father   . Heart attack Father 4  . COPD Mother   . Diabetes Paternal Grandmother   . Parkinsonism Paternal Grandmother   . Stroke Paternal Grandmother 56  . Stroke Maternal Grandfather 72  . Lung cancer Paternal Grandfather        Heavy smoker  . Colon cancer Neg Hx   . Prostate cancer Neg Hx     ALLERGIES:  is allergic to omnipaque [iohexol].  MEDICATIONS:  Current  Outpatient Medications  Medication Sig Dispense Refill  . aspirin 81 MG tablet Take 81 mg by mouth daily.    Marland Kitchen atorvastatin (LIPITOR) 20 MG tablet Take 1 tablet (20 mg total) by mouth at bedtime. 90 tablet 3  . b complex vitamins capsule Take 1 capsule by mouth daily.    Marland Kitchen  colchicine 0.6 MG tablet TAKE 2 TABLETS BY MOUTH AT FIRST SIGNS OF GOUT, THEN 1 TABLET IN 1 HOUR IF NO IMPROVEMENT. CONTINUE TO TAKE 1 TABLET DAILY UNTIL RESOLVED.--patient needs office visit before any further refills 30 tablet 1  . esomeprazole (NEXIUM) 20 MG capsule Take 20 mg by mouth daily before breakfast.    . indomethacin (INDOCIN) 25 MG capsule TK 1 C PO AFTER MEALS Q 12 H  2  . lamoTRIgine (LAMICTAL) 100 MG tablet Take 100 mg by mouth daily.     Marland Kitchen levothyroxine (SYNTHROID, LEVOTHROID) 50 MCG tablet Take 1 tablet (50 mcg total) by mouth daily before breakfast. 90 tablet 1  . naltrexone (DEPADE) 50 MG tablet Take 1 tablet by mouth daily.  12  . QUEtiapine (SEROQUEL) 100 MG tablet Take 400 mg by mouth at bedtime.   98  . sildenafil (REVATIO) 20 MG tablet TAKE 3 TO 4 TABLETS BY MOUTH DAILY AS NEEDED 30 tablet 3  . tamsulosin (FLOMAX) 0.4 MG CAPS capsule Take 1 capsule (0.4 mg total) by mouth daily after supper. 30 capsule 3  . vitamin B-12 (CYANOCOBALAMIN) 500 MCG tablet Take 2 tablets (1,000 mcg total) by mouth daily. 180 tablet 3   No current facility-administered medications for this visit.     REVIEW OF SYSTEMS:   Constitutional: ( - ) fevers, ( - )  chills , ( - ) night sweats Eyes: ( - ) blurriness of vision, ( - ) double vision, ( - ) watery eyes Ears, nose, mouth, throat, and face: ( - ) mucositis, ( - ) sore throat Respiratory: ( - ) cough, ( - ) dyspnea, ( - ) wheezes Cardiovascular: ( - ) palpitation, ( - ) chest discomfort, ( - ) lower extremity swelling Gastrointestinal:  ( - ) nausea, ( - ) heartburn, ( - ) change in bowel habits Skin: ( - ) abnormal skin rashes Lymphatics: ( - ) new lymphadenopathy,  ( - ) easy bruising Neurological: ( - ) numbness, ( - ) tingling, ( - ) new weaknesses Behavioral/Psych: ( - ) mood change, ( - ) new changes  All other systems were reviewed with the patient and are negative.  PHYSICAL EXAMINATION: ECOG PERFORMANCE STATUS: 0 - Asymptomatic  Vitals:   07/19/18 0925  BP: 126/70  Pulse: 91  Resp: 18  Temp: 97.6 F (36.4 C)  SpO2: 100%   Filed Weights   07/19/18 0925  Weight: 194 lb 12.8 oz (88.4 kg)    GENERAL: alert, no distress and comfortable, well appearing SKIN: ~5x5cm ecchymoses over the right lower abdomen with a subcutaneous lump, consistent with hematoma; non-tender, mobile EYES: conjunctiva are pink and non-injected, sclera clear OROPHARYNX: no exudate, no erythema; lips, buccal mucosa, and tongue normal  NECK: supple, non-tender LYMPH:  no palpable lymphadenopathy in the cervical LUNGS: clear to auscultation with normal breathing effort HEART: regular rate & rhythm, no murmurs, no lower extremity edema ABDOMEN: soft, non-tender, non-distended, normal bowel sounds Musculoskeletal: no cyanosis of digits and no clubbing  PSYCH: alert & oriented x 3, fluent speech NEURO: no focal motor/sensory deficits  LABORATORY DATA:  I have reviewed the data as listed Lab Results  Component Value Date   WBC 4.2 07/19/2018   HGB 13.8 07/19/2018   HCT 37.9 (L) 07/19/2018   MCV 102.7 (H) 07/19/2018   PLT 122 (L) 07/19/2018   Lab Results  Component Value Date   NA 141 07/19/2018   K 4.3 07/19/2018   CL 103 07/19/2018  CO2 28 07/19/2018   PATHOLOGY: I personally reviewed the patient's peripheral blood smear today.  The red blood cells were mildly enlarged with occasional stomatocytes, which can be seen in underlying liver disease.  There was no schistocytosis.  The white blood cells were of normal morphology. There were no peripheral circulating blasts. The platelets were of normal size with occasional giant platelets. I verified that there  were no platelet clumping.

## 2018-07-18 ENCOUNTER — Telehealth: Payer: Self-pay | Admitting: Hematology

## 2018-07-18 ENCOUNTER — Other Ambulatory Visit (HOSPITAL_BASED_OUTPATIENT_CLINIC_OR_DEPARTMENT_OTHER): Payer: Self-pay

## 2018-07-18 NOTE — Telephone Encounter (Signed)
Called and screened patient NO SYMPTOMS- and he is aware NO VISITORS

## 2018-07-19 ENCOUNTER — Inpatient Hospital Stay: Payer: Medicare Other | Attending: Hematology | Admitting: Hematology

## 2018-07-19 ENCOUNTER — Inpatient Hospital Stay: Payer: Medicare Other

## 2018-07-19 ENCOUNTER — Encounter: Payer: Self-pay | Admitting: Hematology

## 2018-07-19 ENCOUNTER — Other Ambulatory Visit: Payer: Self-pay

## 2018-07-19 VITALS — BP 126/70 | HR 91 | Temp 97.6°F | Resp 18 | Ht 72.0 in | Wt 194.8 lb

## 2018-07-19 DIAGNOSIS — D539 Nutritional anemia, unspecified: Secondary | ICD-10-CM | POA: Diagnosis not present

## 2018-07-19 DIAGNOSIS — Z7982 Long term (current) use of aspirin: Secondary | ICD-10-CM | POA: Diagnosis not present

## 2018-07-19 DIAGNOSIS — E785 Hyperlipidemia, unspecified: Secondary | ICD-10-CM | POA: Insufficient documentation

## 2018-07-19 DIAGNOSIS — D72819 Decreased white blood cell count, unspecified: Secondary | ICD-10-CM | POA: Diagnosis not present

## 2018-07-19 DIAGNOSIS — E039 Hypothyroidism, unspecified: Secondary | ICD-10-CM | POA: Diagnosis not present

## 2018-07-19 DIAGNOSIS — Z85820 Personal history of malignant melanoma of skin: Secondary | ICD-10-CM | POA: Insufficient documentation

## 2018-07-19 DIAGNOSIS — Z8249 Family history of ischemic heart disease and other diseases of the circulatory system: Secondary | ICD-10-CM | POA: Diagnosis not present

## 2018-07-19 DIAGNOSIS — F319 Bipolar disorder, unspecified: Secondary | ICD-10-CM | POA: Insufficient documentation

## 2018-07-19 DIAGNOSIS — F1010 Alcohol abuse, uncomplicated: Secondary | ICD-10-CM | POA: Diagnosis not present

## 2018-07-19 DIAGNOSIS — F1021 Alcohol dependence, in remission: Secondary | ICD-10-CM

## 2018-07-19 DIAGNOSIS — D696 Thrombocytopenia, unspecified: Secondary | ICD-10-CM | POA: Diagnosis not present

## 2018-07-19 DIAGNOSIS — Z79899 Other long term (current) drug therapy: Secondary | ICD-10-CM | POA: Diagnosis not present

## 2018-07-19 DIAGNOSIS — D509 Iron deficiency anemia, unspecified: Secondary | ICD-10-CM

## 2018-07-19 DIAGNOSIS — K219 Gastro-esophageal reflux disease without esophagitis: Secondary | ICD-10-CM | POA: Diagnosis not present

## 2018-07-19 LAB — CBC WITH DIFFERENTIAL (CANCER CENTER ONLY)
Abs Immature Granulocytes: 0.01 10*3/uL (ref 0.00–0.07)
Basophils Absolute: 0 10*3/uL (ref 0.0–0.1)
Basophils Relative: 1 %
Eosinophils Absolute: 0.1 10*3/uL (ref 0.0–0.5)
Eosinophils Relative: 3 %
HCT: 37.9 % — ABNORMAL LOW (ref 39.0–52.0)
Hemoglobin: 13.8 g/dL (ref 13.0–17.0)
Immature Granulocytes: 0 %
Lymphocytes Relative: 33 %
Lymphs Abs: 1.4 10*3/uL (ref 0.7–4.0)
MCH: 37.4 pg — ABNORMAL HIGH (ref 26.0–34.0)
MCHC: 36.4 g/dL — ABNORMAL HIGH (ref 30.0–36.0)
MCV: 102.7 fL — ABNORMAL HIGH (ref 80.0–100.0)
Monocytes Absolute: 0.4 10*3/uL (ref 0.1–1.0)
Monocytes Relative: 11 %
Neutro Abs: 2.2 10*3/uL (ref 1.7–7.7)
Neutrophils Relative %: 52 %
Platelet Count: 122 10*3/uL — ABNORMAL LOW (ref 150–400)
RBC: 3.69 MIL/uL — ABNORMAL LOW (ref 4.22–5.81)
RDW: 13.2 % (ref 11.5–15.5)
WBC Count: 4.2 10*3/uL (ref 4.0–10.5)
nRBC: 0 % (ref 0.0–0.2)

## 2018-07-19 LAB — CMP (CANCER CENTER ONLY)
ALT: 18 U/L (ref 0–44)
AST: 30 U/L (ref 15–41)
Albumin: 4.6 g/dL (ref 3.5–5.0)
Alkaline Phosphatase: 54 U/L (ref 38–126)
Anion gap: 10 (ref 5–15)
BUN: 17 mg/dL (ref 8–23)
CO2: 28 mmol/L (ref 22–32)
Calcium: 9.4 mg/dL (ref 8.9–10.3)
Chloride: 103 mmol/L (ref 98–111)
Creatinine: 1.33 mg/dL — ABNORMAL HIGH (ref 0.61–1.24)
GFR, Est AFR Am: >60 mL/min (ref >60)
GFR, Estimated: 54 mL/min — ABNORMAL LOW (ref >60)
Glucose, Bld: 115 mg/dL — ABNORMAL HIGH (ref 70–99)
Potassium: 4.3 mmol/L (ref 3.5–5.1)
Sodium: 141 mmol/L (ref 135–145)
Total Bilirubin: 1.2 mg/dL (ref 0.3–1.2)
Total Protein: 6.3 g/dL — ABNORMAL LOW (ref 6.5–8.1)

## 2018-07-19 LAB — FERRITIN: Ferritin: 178 ng/mL (ref 24–336)

## 2018-07-19 LAB — IRON AND TIBC
Iron: 116 ug/dL (ref 42–163)
Saturation Ratios: 36 % (ref 20–55)
TIBC: 319 ug/dL (ref 202–409)
UIBC: 203 ug/dL (ref 117–376)

## 2018-07-19 LAB — LACTATE DEHYDROGENASE: LDH: 288 U/L — ABNORMAL HIGH (ref 98–192)

## 2018-07-19 LAB — SAVE SMEAR(SSMR), FOR PROVIDER SLIDE REVIEW

## 2018-07-19 MED ORDER — CYANOCOBALAMIN 500 MCG PO TABS: 1000.0000 ug | ORAL_TABLET | Freq: Every day | ORAL | 3 refills | Status: AC

## 2018-07-20 LAB — HEPATITIS C ANTIBODY (REFLEX): HCV Ab: <0.1 s/co ratio (ref 0.0–0.9)

## 2018-07-20 LAB — HIV ANTIBODY (ROUTINE TESTING W REFLEX): HIV Screen 4th Generation wRfx: NONREACTIVE

## 2018-07-20 LAB — HEPATITIS B CORE ANTIBODY, TOTAL: Hep B Core Total Ab: NEGATIVE

## 2018-07-20 LAB — HCV COMMENT:

## 2018-07-20 LAB — HEPATITIS B SURFACE ANTIBODY,QUALITATIVE: Hep B S Ab: NONREACTIVE

## 2018-07-20 LAB — HEPATITIS B SURFACE ANTIGEN: Hepatitis B Surface Ag: NEGATIVE

## 2018-07-21 LAB — METHYLMALONIC ACID, SERUM: Methylmalonic Acid, Quantitative: 357 nmol/L (ref 0–378)

## 2018-07-21 LAB — COPPER, SERUM: Copper: 95 ug/dL (ref 72–166)

## 2018-07-21 LAB — ZINC: Zinc: 96 ug/dL (ref 56–134)

## 2018-08-08 ENCOUNTER — Telehealth: Payer: Self-pay | Admitting: Hematology

## 2018-08-08 NOTE — Telephone Encounter (Signed)
SW PT TO CONFIRM 5/12 APPTS LAB/OV/ULTRASOUND PER 3/24 LOS. PT AWARE OF APPT DATE/TIMES

## 2018-09-06 ENCOUNTER — Telehealth: Payer: Self-pay | Admitting: Hematology

## 2018-09-06 ENCOUNTER — Inpatient Hospital Stay (HOSPITAL_BASED_OUTPATIENT_CLINIC_OR_DEPARTMENT_OTHER): Payer: Medicare Other | Admitting: Hematology

## 2018-09-06 ENCOUNTER — Other Ambulatory Visit: Payer: Self-pay

## 2018-09-06 ENCOUNTER — Ambulatory Visit (HOSPITAL_BASED_OUTPATIENT_CLINIC_OR_DEPARTMENT_OTHER)
Admission: RE | Admit: 2018-09-06 | Discharge: 2018-09-06 | Disposition: A | Discharge status: Home / Self Care | Payer: Medicare Other | Source: Ambulatory Visit | Attending: Hematology | Admitting: Hematology

## 2018-09-06 ENCOUNTER — Encounter: Payer: Self-pay | Admitting: Hematology

## 2018-09-06 ENCOUNTER — Ambulatory Visit (HOSPITAL_BASED_OUTPATIENT_CLINIC_OR_DEPARTMENT_OTHER)
Admission: RE | Admit: 2018-09-06 | Discharge: 2018-09-06 | Disposition: A | Discharge status: Home / Self Care | Payer: Medicare Other | Source: Ambulatory Visit | Attending: Internal Medicine | Admitting: Internal Medicine

## 2018-09-06 ENCOUNTER — Inpatient Hospital Stay: Payer: Medicare Other | Attending: Hematology

## 2018-09-06 VITALS — BP 157/91 | HR 80 | Resp 19 | Ht 72.0 in | Wt 192.0 lb

## 2018-09-06 DIAGNOSIS — K760 Fatty (change of) liver, not elsewhere classified: Secondary | ICD-10-CM | POA: Insufficient documentation

## 2018-09-06 DIAGNOSIS — N189 Chronic kidney disease, unspecified: Secondary | ICD-10-CM | POA: Insufficient documentation

## 2018-09-06 DIAGNOSIS — Z7982 Long term (current) use of aspirin: Secondary | ICD-10-CM

## 2018-09-06 DIAGNOSIS — Z79899 Other long term (current) drug therapy: Secondary | ICD-10-CM | POA: Insufficient documentation

## 2018-09-06 DIAGNOSIS — K219 Gastro-esophageal reflux disease without esophagitis: Secondary | ICD-10-CM | POA: Diagnosis not present

## 2018-09-06 DIAGNOSIS — D696 Thrombocytopenia, unspecified: Secondary | ICD-10-CM

## 2018-09-06 DIAGNOSIS — F1021 Alcohol dependence, in remission: Secondary | ICD-10-CM | POA: Diagnosis not present

## 2018-09-06 DIAGNOSIS — M85851 Other specified disorders of bone density and structure, right thigh: Secondary | ICD-10-CM | POA: Insufficient documentation

## 2018-09-06 DIAGNOSIS — K8020 Calculus of gallbladder without cholecystitis without obstruction: Secondary | ICD-10-CM | POA: Insufficient documentation

## 2018-09-06 DIAGNOSIS — Z13820 Encounter for screening for osteoporosis: Secondary | ICD-10-CM | POA: Diagnosis not present

## 2018-09-06 DIAGNOSIS — K838 Other specified diseases of biliary tract: Secondary | ICD-10-CM

## 2018-09-06 DIAGNOSIS — D539 Nutritional anemia, unspecified: Secondary | ICD-10-CM | POA: Insufficient documentation

## 2018-09-06 DIAGNOSIS — E785 Hyperlipidemia, unspecified: Secondary | ICD-10-CM | POA: Diagnosis not present

## 2018-09-06 DIAGNOSIS — M8580 Other specified disorders of bone density and structure, unspecified site: Secondary | ICD-10-CM | POA: Diagnosis present

## 2018-09-06 LAB — CMP (CANCER CENTER ONLY)
ALT: 13 U/L (ref 0–44)
AST: 21 U/L (ref 15–41)
Albumin: 4.4 g/dL (ref 3.5–5.0)
Alkaline Phosphatase: 51 U/L (ref 38–126)
Anion gap: 8 (ref 5–15)
BUN: 18 mg/dL (ref 8–23)
CO2: 29 mmol/L (ref 22–32)
Calcium: 9.7 mg/dL (ref 8.9–10.3)
Chloride: 102 mmol/L (ref 98–111)
Creatinine: 1.25 mg/dL — ABNORMAL HIGH (ref 0.61–1.24)
GFR, Est AFR Am: >60 mL/min (ref >60)
GFR, Estimated: 58 mL/min — ABNORMAL LOW (ref >60)
Glucose, Bld: 90 mg/dL (ref 70–99)
Potassium: 4 mmol/L (ref 3.5–5.1)
Sodium: 139 mmol/L (ref 135–145)
Total Bilirubin: 0.7 mg/dL (ref 0.3–1.2)
Total Protein: 6.6 g/dL (ref 6.5–8.1)

## 2018-09-06 LAB — CBC WITH DIFFERENTIAL (CANCER CENTER ONLY)
Abs Immature Granulocytes: 0.01 10*3/uL (ref 0.00–0.07)
Basophils Absolute: 0 10*3/uL (ref 0.0–0.1)
Basophils Relative: 1 %
Eosinophils Absolute: 0 10*3/uL (ref 0.0–0.5)
Eosinophils Relative: 1 %
HCT: 40.9 % (ref 39.0–52.0)
Hemoglobin: 14.7 g/dL (ref 13.0–17.0)
Immature Granulocytes: 0 %
Lymphocytes Relative: 26 %
Lymphs Abs: 1.1 10*3/uL (ref 0.7–4.0)
MCH: 34.6 pg — ABNORMAL HIGH (ref 26.0–34.0)
MCHC: 35.9 g/dL (ref 30.0–36.0)
MCV: 96.2 fL (ref 80.0–100.0)
Monocytes Absolute: 0.4 10*3/uL (ref 0.1–1.0)
Monocytes Relative: 8 %
Neutro Abs: 2.7 10*3/uL (ref 1.7–7.7)
Neutrophils Relative %: 64 %
Platelet Count: 170 10*3/uL (ref 150–400)
RBC: 4.25 MIL/uL (ref 4.22–5.81)
RDW: 12.5 % (ref 11.5–15.5)
WBC Count: 4.3 10*3/uL (ref 4.0–10.5)
nRBC: 0 % (ref 0.0–0.2)

## 2018-09-06 LAB — VITAMIN B12: Vitamin B-12: 650 pg/mL (ref 180–914)

## 2018-09-06 LAB — SAVE SMEAR (SSMR)

## 2018-09-06 LAB — LACTATE DEHYDROGENASE: LDH: 168 U/L (ref 98–192)

## 2018-09-06 NOTE — Telephone Encounter (Signed)
Appts scheduled letter/calendar mailed per 5/12 los °

## 2018-09-06 NOTE — Progress Notes (Addendum)
[facility] OFFICE PROGRESS NOTE  Patient Care Team: Colon Branch, MD as PCP - General (Internal Medicine) Chucky May, MD as Consulting Physician (Psychiatry) Kathrynn Ducking, MD as Consulting Physician (Neurology) Tat, Eustace Quail, DO as Consulting Physician (Neurology) Marica Otter, [place] as Consulting Physician (Optometry) Irene Shipper, MD as Consulting Physician (Gastroenterology) Demetrius Revel, DDS as Consulting Physician (Dentistry) Gavin Pound, MD as Consulting Physician (Rheumatology) Martinique, Amy, MD as Consulting Physician (Dermatology)  HEME/ONC OVERVIEW: 1. Intermittent anemia with macrocytosis -Possibly due to mild B12 deficiency and EtOH use  -Nutritional and thyroid studies otherwise unremarkable; colonoscopy unremarkable in 2015  2. Mild intermittent thrombocytopenia and leukopenia  ASSESSMENT & PLAN:   Intermittent anemia with macrocytosis  -Possibly due to mild B12 deficiency and EtOH abuse -Patient is currently on PO cyanocobalamin 1020mcg daily  -Hgb 14.7 with normal MCV today, improving  -I personally reviewed the patient's peripheral blood smear, which showed normal-sized RBCs without any schistocytosis.  WBC and platelet morphology was normal, and I verified that there was no platelet clumping. -See discussion regarding EtOH use below  -Continue B12 supplement daily   Intermittent thrombocytopenia -Etiology as outlined above -Abdominal US showed hepatic steatosis without any evidence of cirrhosis -Plts 170k today, improving -Patient denies any abdominal bleeding or bruising -See discussion regarding EtOH use below  Dilated common bile duct -Abdominal US showed incidental mildly dilated CBD -LFT's normal -Given the unexplained dilated CBD, I discussed the case with Dr. Bryan Lemma, who will see him and determine if any further work-up is indicated -I have placed referral to GI, and made the patient aware   Stage II-III  CKD -Baseline Cr 1.2-1.3 -Cr 1.25 today, stable; electrolytes normal -I counseled the patient on the importance of maintaining adequate hydration, and avoid nephrotoxic medications, such as NSAIDs -Continue follow-up with PCP   EtOH use  -Patient reports hx of heavy EtOH abuse with episodic relapses -Since our last visit, he was EtOH free for over a month, but over the past weekend, he had a total of 5 shots of liquor -Given the improvement in his CBC since stopping EtOH use, I strongly counseled the patient on the importance of abstinence from alcohol, as it can cause liver dysfunction and bone marrow toxicities  -Patient expressed understanding, and has set the goal of EtOH abstinence  Orders Placed This Encounter  Procedures  . CBC with Differential (Cancer Center Only)    Standing Status:   Future    Standing Expiration Date:   10/11/2019  . CMP ([facility] only)    Standing Status:   Future    Standing Expiration Date:   10/11/2019  . Save Smear (SSMR)    Standing Status:   Future    Standing Expiration Date:   09/06/2019  . Lactate dehydrogenase    Standing Status:   Future    Standing Expiration Date:   10/11/2019   All questions were answered. The patient knows to call the clinic with any problems, questions or concerns. No barriers to learning was detected.  Return in 6 months for labs and clinic follow-up.   Tish Men, MD 09/06/2018 10:42 AM  CHIEF COMPLAINT: "I am doing much better"  INTERVAL HISTORY: Jeffrey Acosta returns to clinic for follow-up of intermittent pancytopenia.  Patient reports that after our discussion regarding alcohol cessation during the last visit, he went 42 days without drinking any alcohol.  Over the past weekend, he had 2 shots of liquor on Saturday and 3 shots  of liquor on Sunday, but has not drink anymore since then.  He has made a goal of alcohol abstinence, and is committed to the goal.  He otherwise denies any other abnormal complaint today,  such as fever, chill, night sweats, lymphadenopathy, or unexplained weight loss.  REVIEW OF SYSTEMS:   Constitutional: ( - ) fevers, ( - )  chills , ( - ) night sweats Eyes: ( - ) blurriness of vision, ( - ) double vision, ( - ) watery eyes Ears, nose, mouth, throat, and face: ( - ) mucositis, ( - ) sore throat Respiratory: ( - ) cough, ( - ) dyspnea, ( - ) wheezes Cardiovascular: ( - ) palpitation, ( - ) chest discomfort, ( - ) lower extremity swelling Gastrointestinal:  ( - ) nausea, ( - ) heartburn, ( - ) change in bowel habits Skin: ( - ) abnormal skin rashes Lymphatics: ( - ) new lymphadenopathy, ( - ) easy bruising Neurological: ( - ) numbness, ( - ) tingling, ( - ) new weaknesses Behavioral/Psych: ( - ) mood change, ( - ) new changes  All other systems were reviewed with the patient and are negative.  I have reviewed the past medical history, past surgical history, social history and family history with the patient and they are unchanged from previous note.  ALLERGIES:  is allergic to omnipaque [iohexol].  MEDICATIONS:  Current Outpatient Medications  Medication Sig Dispense Refill  . aspirin 81 MG tablet Take 81 mg by mouth daily.    Marland Kitchen atorvastatin (LIPITOR) 20 MG tablet Take 1 tablet (20 mg total) by mouth at bedtime. 90 tablet 3  . b complex vitamins capsule Take 1 capsule by mouth daily.    . colchicine 0.6 MG tablet TAKE 2 TABLETS BY MOUTH AT FIRST SIGNS OF GOUT, THEN 1 TABLET IN 1 HOUR IF NO IMPROVEMENT. CONTINUE TO TAKE 1 TABLET DAILY UNTIL RESOLVED.--patient needs office visit before any further refills 30 tablet 1  . esomeprazole (NEXIUM) 20 MG capsule Take 20 mg by mouth daily before breakfast.    . indomethacin (INDOCIN) 25 MG capsule TK 1 C PO AFTER MEALS Q 12 H  2  . lamoTRIgine (LAMICTAL) 100 MG tablet Take 100 mg by mouth daily.     Marland Kitchen lamoTRIgine (LAMICTAL) 25 MG tablet TK 1 T PO QD    . levothyroxine (SYNTHROID, LEVOTHROID) 50 MCG tablet Take 1 tablet (50 mcg  total) by mouth daily before breakfast. 90 tablet 1  . naltrexone (DEPADE) 50 MG tablet Take 1 tablet by mouth daily.  12  . QUEtiapine (SEROQUEL) 200 MG tablet TK 2 TO 3 TS PO HS    . sildenafil (REVATIO) 20 MG tablet TAKE 3 TO 4 TABLETS BY MOUTH DAILY AS NEEDED 30 tablet 3  . tamsulosin (FLOMAX) 0.4 MG CAPS capsule Take 1 capsule (0.4 mg total) by mouth daily after supper. 30 capsule 3  . vitamin B-12 (CYANOCOBALAMIN) 500 MCG tablet Take 2 tablets (1,000 mcg total) by mouth daily. 180 tablet 3   No current facility-administered medications for this visit.     PHYSICAL EXAMINATION: ECOG PERFORMANCE STATUS: 0 - Asymptomatic  Today's Vitals   09/06/18 1009 09/06/18 1024  BP: (!) 157/91   Pulse: 80   Resp: 19   SpO2: 100%   Weight: 192 lb (87.1 kg)   Height: 6' (1.829 m)   PainSc: 0-No pain 0-No pain   Body mass index is 26.04 kg/m.  Filed Weights   09/06/18 1009  Weight: 192 lb (87.1 kg)    GENERAL: alert, no distress and comfortable SKIN: skin color, texture, turgor are normal, no rashes or significant lesions EYES: conjunctiva are pink and non-injected, sclera clear OROPHARYNX: no exudate, no erythema; lips, buccal mucosa, and tongue normal  NECK: supple, non-tender LYMPH:  no palpable lymphadenopathy in the cervical LUNGS: clear to auscultation with normal breathing effort HEART: regular rate & rhythm and no murmurs and no lower extremity edema ABDOMEN: soft, non-tender, non-distended, normal bowel sounds Musculoskeletal: no cyanosis of digits and no clubbing  PSYCH: alert & oriented x 3, fluent speech  LABORATORY DATA:  I have reviewed the data as listed    Component Value Date/Time   NA 139 09/06/2018 0940   NA 140 07/17/2015   K 4.0 09/06/2018 0940   CL 102 09/06/2018 0940   CO2 29 09/06/2018 0940   GLUCOSE 90 09/06/2018 0940   BUN 18 09/06/2018 0940   BUN 9 07/17/2015   CREATININE 1.25 (H) 09/06/2018 0940   CALCIUM 9.7 09/06/2018 0940   PROT 6.6  09/06/2018 0940   ALBUMIN 4.4 09/06/2018 0940   AST 21 09/06/2018 0940   ALT 13 09/06/2018 0940   ALKPHOS 51 09/06/2018 0940   BILITOT 0.7 09/06/2018 0940   GFRNONAA 58 (L) 09/06/2018 0940   GFRAA >60 09/06/2018 0940    No results found for: SPEP, UPEP  Lab Results  Component Value Date   WBC 4.3 09/06/2018   NEUTROABS 2.7 09/06/2018   HGB 14.7 09/06/2018   HCT 40.9 09/06/2018   MCV 96.2 09/06/2018   PLT 170 09/06/2018      Chemistry      Component Value Date/Time   NA 139 09/06/2018 0940   NA 140 07/17/2015   K 4.0 09/06/2018 0940   CL 102 09/06/2018 0940   CO2 29 09/06/2018 0940   BUN 18 09/06/2018 0940   BUN 9 07/17/2015   CREATININE 1.25 (H) 09/06/2018 0940   GLU 95 07/17/2015      Component Value Date/Time   CALCIUM 9.7 09/06/2018 0940   ALKPHOS 51 09/06/2018 0940   AST 21 09/06/2018 0940   ALT 13 09/06/2018 0940   BILITOT 0.7 09/06/2018 0940     PATHOLOGY: I personally reviewed the patient's peripheral blood smear today.  The red blood cells were of normal morphology.  There was no schistocytosis.  The white blood cells were of normal morphology. There were no peripheral circulating blasts. The platelets were of normal size and I verified that there were no platelet clumping.

## 2018-09-06 NOTE — Addendum Note (Signed)
Addended by: Tish Men on: 09/06/2018 01:16 PM   Modules accepted: Orders

## 2018-09-12 ENCOUNTER — Telehealth: Payer: Self-pay

## 2018-09-12 ENCOUNTER — Encounter: Payer: Self-pay | Admitting: Internal Medicine

## 2018-09-12 ENCOUNTER — Other Ambulatory Visit: Payer: Self-pay

## 2018-09-12 ENCOUNTER — Ambulatory Visit (INDEPENDENT_AMBULATORY_CARE_PROVIDER_SITE_OTHER): Payer: Medicare Other | Admitting: Internal Medicine

## 2018-09-12 VITALS — Ht 72.0 in | Wt 198.0 lb

## 2018-09-12 DIAGNOSIS — K8020 Calculus of gallbladder without cholecystitis without obstruction: Secondary | ICD-10-CM | POA: Diagnosis not present

## 2018-09-12 DIAGNOSIS — K838 Other specified diseases of biliary tract: Secondary | ICD-10-CM

## 2018-09-12 DIAGNOSIS — K5730 Diverticulosis of large intestine without perforation or abscess without bleeding: Secondary | ICD-10-CM

## 2018-09-12 DIAGNOSIS — R935 Abnormal findings on diagnostic imaging of other abdominal regions, including retroperitoneum: Secondary | ICD-10-CM

## 2018-09-12 NOTE — Telephone Encounter (Signed)
Patient screened over phone for phone visit today.

## 2018-09-12 NOTE — Progress Notes (Signed)
HISTORY OF PRESENT ILLNESS:  Jeffrey Acosta is a 68 y.o. male with past medical history as listed below who schedules this telehealth visit during the coronavirus pandemic at the recommendation of his hematologist Dr. Maylon Peppers regarding dilated bile duct on ultrasound.  I saw the patient July 2015 for screening colonoscopy.  Examination was normal except for moderate left-sided diverticulosis.  Follow-up in 10 years recommended.  Patient was being evaluated for thrombocytopenia and macrocytosis.  As part of his work-up he underwent an ultrasound to rule out cirrhosis and splenomegaly.  Abdominal ultrasound was performed Sep 06, 2018.  Patient was noted to have multiple gallstones in the gallbladder.  His bile duct was said to be dilated at 11 mm.  The etiology unclear.  Review of outside blood work from Sep 06, 2018 shows normal liver tests including alkaline phosphatase of 51.  Patient denies abdominal pain.  No nausea, vomiting, or excellent weight loss.  No jaundice or pruritus.  GI review of systems is negative.  REVIEW OF SYSTEMS:  All non-GI ROS negative unless otherwise stated in HPI except for headaches  Past Medical History:  Diagnosis Date  . Alcoholism in remission ([place])    in [place]  . Bipolar affective disorder ([place])   . Diverticulosis of colon (without mention of hemorrhage)   . GERD (gastroesophageal reflux disease)   . Gout of multiple sites   . Hiatal hernia   . Hx of migraines   . Hydrocele   . HYPERLIPIDEMIA 01/10/2009   Qualifier: Diagnosis of  By: Ronnald Ramp CMA, Chemira    NMR Lipoprofile 2011 LDL could not be calculated  Due to TG of 889  (811  / 750 ),  HDL 50 . Father MI @ 73 PGM CVA early 23s; PGF CVA @72    . Hypothyroidism   . Melanoma of skin, site unspecified 11/25/2012   07/2011 abdominal  Stage 1 Dr Amy Martinique Dr Sarajane Jews   . Osteopenia   . Polyarthralgia     Past Surgical History:  Procedure Laterality Date  . COLONOSCOPY  2003   negative ; Dr Sharlett Iles  . INGUINAL  HERNIA REPAIR  08/07/2011   Procedure: HERNIA REPAIR INGUINAL ADULT;  Surgeon: Rolm Bookbinder, MD;  Location: [place];  Service: General;  Laterality: Right;  . MELANOMA EXCISION  07/28/11   Dr Sarajane Jews; stomach and chest  . TONSILLECTOMY  1951  . 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 UPPER GASTROINTESTINAL ENDOSCOPY  1983   hiatal hernia  . [place]  reports that he has never smoked. He has never used smokeless tobacco. He reports that he does not drink alcohol or use drugs.  family history includes COPD in his father and mother; Diabetes in his paternal grandmother; Heart attack (age of onset: 69) in his father; Lung cancer in his paternal grandfather; Parkinsonism in his paternal grandmother; Stroke (age of onset: 88) in his paternal grandmother; Stroke (age of onset: 80) in his maternal grandfather.  Allergies  Allergen Reactions  . Omnipaque [Iohexol] Hives     Desc: developed hives after injection; resolved with 50 mg benadryl given to pt.        PHYSICAL EXAMINATION: No physical exam with telehealth visit   ASSESSMENT:  1.  Dilated bile duct of uncertain clinical significance.  Patient does have cholelithiasis.  Though less common, I certainly have seen choledocholithiasis in seemingly asymptomatic patients with normal liver tests.  Early obstructive lesion as well should be ruled out 2.  Diverticulosis on colonoscopy 2015  PLAN:  1.  SCHEDULE MRI of the abdomen/MRCP.  "Biliary ductal dilation, rule out stone or obstructing lesion".  Contact patient with results when available. 2.  Repeat screening colonoscopy around 2025 3.  Ongoing general medical care with primary provider and other specialists This telehealth medicine visit scheduled by the patient and consented for by the patient.  He was in his home and I was in my office during the encounter.  He understands there may be associated professional  charge for this service A copy of this consultation note has been sent to Dr. Larose Kells and Dr. Maylon Peppers

## 2018-09-13 ENCOUNTER — Telehealth: Payer: Self-pay

## 2018-09-13 ENCOUNTER — Ambulatory Visit: Payer: Self-pay | Admitting: Hematology

## 2018-09-13 ENCOUNTER — Other Ambulatory Visit: Payer: Self-pay

## 2018-09-13 NOTE — Telephone Encounter (Signed)
Scheduled MRI/MRCP of the abdomen at [facility], 09/20/2018 at 9:30am.  Patient aware of date and time.

## 2018-09-13 NOTE — Patient Instructions (Signed)
1. You have been scheduled for an MRI/MRCP at [facility] on 09/20/2018. Your appointment time is 9:00am. Please arrive at 8:30am for registration purposes. Please make certain not to have anything to eat or drink 4 hours prior to your test. In addition, if you have any metal in your body, have a pacemaker or defibrillator, please be sure to let your ordering physician know. This test typically takes 45 minutes to 1 hour to complete. Should you need to reschedule, please call [phone] to do so.  2.  Repeat screening colonoscopy around 2025 3.  Ongoing general medical care with primary provider and other specialists

## 2018-09-13 NOTE — Addendum Note (Signed)
Addended by: Audrea Muscat on: 09/13/2018 10:39 AM   Modules accepted: Orders

## 2018-09-14 DIAGNOSIS — D2262 Melanocytic nevi of left upper limb, including shoulder: Secondary | ICD-10-CM | POA: Diagnosis not present

## 2018-09-14 DIAGNOSIS — Z85820 Personal history of malignant melanoma of skin: Secondary | ICD-10-CM | POA: Diagnosis not present

## 2018-09-14 DIAGNOSIS — L821 Other seborrheic keratosis: Secondary | ICD-10-CM | POA: Diagnosis not present

## 2018-09-14 DIAGNOSIS — D225 Melanocytic nevi of trunk: Secondary | ICD-10-CM | POA: Diagnosis not present

## 2018-09-20 ENCOUNTER — Other Ambulatory Visit: Payer: Self-pay | Admitting: Internal Medicine

## 2018-09-20 ENCOUNTER — Ambulatory Visit (HOSPITAL_COMMUNITY)
Admission: RE | Admit: 2018-09-20 | Discharge: 2018-09-20 | Disposition: A | Discharge status: Home / Self Care | Payer: Medicare Other | Source: Ambulatory Visit | Attending: Internal Medicine | Admitting: Internal Medicine

## 2018-09-20 ENCOUNTER — Other Ambulatory Visit: Payer: Self-pay

## 2018-09-20 DIAGNOSIS — R935 Abnormal findings on diagnostic imaging of other abdominal regions, including retroperitoneum: Secondary | ICD-10-CM

## 2018-09-20 DIAGNOSIS — K838 Other specified diseases of biliary tract: Secondary | ICD-10-CM

## 2018-09-20 DIAGNOSIS — K5730 Diverticulosis of large intestine without perforation or abscess without bleeding: Secondary | ICD-10-CM

## 2018-09-20 DIAGNOSIS — K8020 Calculus of gallbladder without cholecystitis without obstruction: Secondary | ICD-10-CM | POA: Insufficient documentation

## 2018-09-20 MED ORDER — GADOBUTROL 1 MMOL/ML IV SOLN
8.0000 mL | Freq: Once | INTRAVENOUS | Status: AC | PRN
  Administered 2018-09-20: 8 mL via INTRAVENOUS

## 2018-10-06 ENCOUNTER — Other Ambulatory Visit: Payer: Self-pay

## 2018-10-06 ENCOUNTER — Ambulatory Visit (INDEPENDENT_AMBULATORY_CARE_PROVIDER_SITE_OTHER): Payer: Medicare Other | Admitting: Internal Medicine

## 2018-10-06 DIAGNOSIS — D539 Nutritional anemia, unspecified: Secondary | ICD-10-CM | POA: Diagnosis not present

## 2018-10-06 DIAGNOSIS — D696 Thrombocytopenia, unspecified: Secondary | ICD-10-CM | POA: Diagnosis not present

## 2018-10-06 DIAGNOSIS — D72819 Decreased white blood cell count, unspecified: Secondary | ICD-10-CM

## 2018-10-06 DIAGNOSIS — M1000 Idiopathic gout, unspecified site: Secondary | ICD-10-CM

## 2018-10-06 DIAGNOSIS — F1021 Alcohol dependence, in remission: Secondary | ICD-10-CM

## 2018-10-06 NOTE — Progress Notes (Signed)
Subjective:    Patient ID: Jeffrey Acosta, male    DOB: 21-Jan-1951, 68 y.o.   MRN: [id number]  DOS:  10/06/2018 Type of visit - description: Virtual Visit via Video Note  I connected with@ on 10/06/18 at  8:40 AM EDT by a video enabled telemedicine application and verified that I am speaking with the correct person using two identifiers.   THIS ENCOUNTER IS A VIRTUAL VISIT DUE TO COVID-19 - PATIENT WAS NOT SEEN IN THE OFFICE. PATIENT HAS CONSENTED TO VIRTUAL VISIT / TELEMEDICINE VISIT   Location of patient: home  Location of provider: office  I discussed the limitations of evaluation and management by telemedicine and the availability of in person appointments. The patient expressed understanding and agreed to proceed.  History of Present Illness: Routine follow-up Anxiety is somewhat increased recently, unfortunately he relapsed with alcohol intake this week but is ready to face the issue. History of gout, reports he is taking allopurinol regularly, no attacks in 2 years COVID-19: Following good precautions Pancytopenia: Note from hematology reviewed Note from GI reviewed    Review of Systems  Denies fever chills No nausea, vomiting, diarrhea No suicidal thoughts Past Medical History:  Diagnosis Date  . Alcoholism in remission ([place])    in [place]  . Bipolar affective disorder ([place])   . Diverticulosis of colon (without mention of hemorrhage)   . GERD (gastroesophageal reflux disease)   . Gout of multiple sites   . Hiatal hernia   . Hx of migraines   . Hydrocele   . HYPERLIPIDEMIA 01/10/2009   Qualifier: Diagnosis of  By: Ronnald Ramp CMA, Chemira    NMR Lipoprofile 2011 LDL could not be calculated  Due to TG of 889  (811  / 750 ),  HDL 50 . Father MI @ 53 PGM CVA early 32s; PGF CVA @72    . Hypothyroidism   . Melanoma of skin, site unspecified 11/25/2012   07/2011 abdominal  Stage 1 Dr Amy Martinique Dr Sarajane Jews   . Osteopenia   . Polyarthralgia     Past Surgical History:  Procedure  Laterality Date  . COLONOSCOPY  2003   negative ; Dr Sharlett Iles  . INGUINAL HERNIA REPAIR  08/07/2011   Procedure: HERNIA REPAIR INGUINAL ADULT;  Surgeon: Rolm Bookbinder, MD;  Location: [place];  Service: General;  Laterality: Right;  . MELANOMA EXCISION  07/28/11   Dr Sarajane Jews; stomach and chest  . TONSILLECTOMY  1951  . 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 UPPER GASTROINTESTINAL ENDOSCOPY  1983   hiatal hernia  . VASECTOMY  1979    Social History   Socioeconomic History  . Marital status: Married    Spouse name: [name]  . Number of children: 1  . Years of education: Not on file  . Highest education level: Not on file  Occupational History  . Occupation: retired  Scientific laboratory technician  . Financial resource strain: Not on file  . Food insecurity    Worry: Not on file    Inability: Not on file  . Transportation needs    Medical: Not on file    Non-medical: Not on file  Tobacco Use  . Smoking status: Never Smoker  . Smokeless tobacco: Never Used  Substance and Sexual Activity  . Alcohol use: No    Alcohol/week: 0.0 standard drinks    Comment: former alcoholic;   in remission   . Drug use: No  . Sexual activity: Yes  Partners: Female    Comment: Having some issues with ED.  Would like provider's recommendation.    Lifestyle  . Physical activity    Days per week: Not on file    Minutes per session: Not on file  . Stress: Not on file  Relationships  . Social Herbalist on phone: Not on file    Gets together: Not on file    Attends religious service: Not on file    Active member of club or organization: Not on file    Attends meetings of clubs or organizations: Not on file    Relationship status: Not on file  . Intimate partner violence    Fear of current or ex partner: Not on file    Emotionally abused: Not on file    Physically abused: Not on file    Forced sexual activity: Not on file  Other Topics Concern  . Not on file   Social History Narrative   1 child, 2 step children   Household- pt, wife    Leo Rod [place] (sister)      Allergies as of 10/06/2018      Reactions   Omnipaque [iohexol] Hives    Desc: developed hives after injection; resolved with 50 mg benadryl given to pt.      Medication List       Accurate as of October 06, 2018  4:38 PM. If you have any questions, ask your nurse or doctor.        allopurinol 300 MG tablet Commonly known as: ZYLOPRIM Take 1 tablet (300 mg total) by mouth daily.   aspirin 81 MG tablet Take 81 mg by mouth daily.   atorvastatin 20 MG tablet Commonly known as: LIPITOR Take 1 tablet (20 mg total) by mouth at bedtime.   b complex vitamins capsule Take 1 capsule by mouth daily.   colchicine 0.6 MG tablet TAKE 2 TABLETS BY MOUTH AT FIRST SIGNS OF GOUT, THEN 1 TABLET IN 1 HOUR IF NO IMPROVEMENT. CONTINUE TO TAKE 1 TABLET DAILY UNTIL RESOLVED.--patient needs office visit before any further refills   esomeprazole 20 MG capsule Commonly known as: NEXIUM Take 20 mg by mouth daily before breakfast.   indomethacin 25 MG capsule Commonly known as: INDOCIN TK 1 C PO AFTER MEALS Q 12 H   lamoTRIgine 100 MG tablet Commonly known as: LAMICTAL Take 100 mg by mouth daily.   lamoTRIgine 25 MG tablet Commonly known as: LAMICTAL TK 1 T PO QD   levothyroxine 50 MCG tablet Commonly known as: SYNTHROID Take 1 tablet (50 mcg total) by mouth daily before breakfast.   naltrexone 50 MG tablet Commonly known as: DEPADE Take 1 tablet by mouth daily.   QUEtiapine 200 MG tablet Commonly known as: SEROQUEL TK 2 TO 3 TS PO HS   sildenafil 20 MG tablet Commonly known as: REVATIO TAKE 3 TO 4 TABLETS BY MOUTH DAILY AS NEEDED   tamsulosin 0.4 MG Caps capsule Commonly known as: FLOMAX Take 1 capsule (0.4 mg total) by mouth daily after supper.   vitamin B-12 500 MCG tablet Commonly known as: CYANOCOBALAMIN Take 2 tablets (1,000 mcg  total) by mouth daily.           Objective:   Physical Exam There were no vitals taken for this visit. Patient is alert oriented x3, he does look tired but in no emotional or physical distress.       Assessment  Assessment  Bipolar disorder-- Dr Toy Care, dc lithium 07-2014 EtOH abuse-- remission w/ occ relapse Hypothyroidism Hyperlipidemia E.D.  MSK: ---Back pain: Saw Dr. Ramos/Ge[place]~ 2002, 3 local injections, offered surgery.  ---Gout-- Dr Berna Bue, released to PCP 2018 Melanoma 2014 Dr. Martinique Osteopenia  T score -2.4  ( 11-2012) Vit d def dx 02-2015 GI: GERD, hiatal hernia, diverticulitis LUTS   PLAN Bipolar: Still see Dr. Toy Care, currently on Lamictal, Seroquel.  Admits to some anxiety lately. EtOH: Had a relapse in the last few days but is ready to face the issue, has good family support, still goes to [employer].  Offered sooner appointment, the patient reports that is not necessary Hematology: Recently evaluated for anemia, microcytosis, thrombocytopenia, leukopenia.  Felt to be possibly due to mild B12 deficiency and EtOH, was recommended B12 supplements.  They also agreed to stopping Depakote which was done 06-2018. Abnormal liver ultrasound:w/u at hematology, disclosed dilated bile duct on the [place], saw GI, they recommended MRI abdomen/MRCP, it showed nonspecific tapering of the distal bile duct.  They are considering a EUS Soft systolic murmur: See my last note, recheck at the next in person visit Gout: On allopurinol, for some reason is not on his medication list, will put it back on.    Preventive care: Recommend early flu shot this season RTC 6 months   I discussed the assessment and treatment plan with the patient. The patient was provided an opportunity to ask questions and all were answered. The patient agreed with the plan and demonstrated an understanding of the instructions.   The patient was advised to call back or seek an in-person evaluation if the  symptoms worsen or if the condition fails to improve as anticipated.

## 2018-10-06 NOTE — Assessment & Plan Note (Signed)
Bipolar: Still see Dr. Toy Care, currently on Lamictal, Seroquel.  Admits to some anxiety lately. EtOH: Had a relapse in the last few days but is ready to face the issue, has good family support, still goes to [employer].  Offered sooner appointment, the patient reports that is not necessary Hematology: Recently evaluated for anemia, microcytosis, thrombocytopenia, leukopenia.  Felt to be possibly due to mild B12 deficiency and EtOH, was recommended B12 supplements.  They also agreed to stopping Depakote which was done 06-2018. Abnormal liver ultrasound:w/u at hematology, disclosed dilated bile duct on the [place], saw GI, they recommended MRI abdomen/MRCP, it showed nonspecific tapering of the distal bile duct.  They are considering a EUS Soft systolic murmur: See my last note, recheck at the next in person visit Gout: On allopurinol, for some reason is not on his medication list, will put it back on.    Preventive care: Recommend early flu shot this season RTC 6 months

## 2018-11-07 ENCOUNTER — Telehealth: Payer: Self-pay

## 2018-11-07 NOTE — Telephone Encounter (Signed)
Dr Rush Landmark please review for EUS/ERCP.  I can't find any note where you reviewed.

## 2018-11-07 NOTE — Telephone Encounter (Signed)
See other message that I have sent. HFP this week or next. Move forward with scheduling on date that we had agreed upon. Have a HFP done on day of procedure. If not LFT changes, on both of these, then likely only proceed with EUS but should be scheduled as an EUS/ERCP time slot. Thanks. GM

## 2018-11-07 NOTE — Telephone Encounter (Signed)
-----   Message from Timothy Lasso, RN sent at 11/07/2018 10:04 AM EDT ----- Notes recorded by Timothy Lasso, RN on 09/26/2018 at 9:09 AM EDT  Message to call pt in 4-6 weeks to set up procedure  ------   Notes recorded by Algernon Huxley, RN on 09/20/2018 at 11:54 AM EDT  Spoke with pts wife and she is aware and knows we will be back in touch once Dr. Rush Landmark reviews the report.  ------   Notes recorded by Irene Shipper, MD on 09/20/2018 at 11:47 AM EDT  Please let the patient know that his examination reveals nonspecific tapering of his distal bile duct. Though this may represent a stricture, it may also be normal. His liver tests are normal. No evidence of stones in the bile duct (only the gallbladder as previously known) or pancreatic abnormality. To further evaluate his distal bile duct, I would recommend EUS (with option for ERCP at that time). I will forward to Dr. Rush Landmark for his opinion when he returns next week. Thank you ----- Message ----- From: Timothy Lasso, RN Sent: 11/07/2018 To: Timothy Lasso, RN  Vaughan Basta or Lakechia Nay or covering RN please schedule an EUS plus ERCP in the next 4-6 weeks for the patient. He should have a stat hepatic function panel performed on the day of his procedure. Thanks.

## 2018-11-07 NOTE — Telephone Encounter (Signed)
Mansouraty, Telford Nab., MD  Timothy Lasso, RN        This was the previous message.  I think you were away that week.  Either way, let's move forward with scheduling as we had discussed at the time/date.  Please have patient get labs done this week (HFP).  Then also have him have STAT labs on the day of procedure and if all are normal, then we will plan to only perform EUS.  But schedule in an EUS/ERCP time slot.  Thanks.  GM   Previous Messages  ----- Message -----  From: Irving Copas., MD  Sent: 09/20/2018 12:50 PM EDT  To: Irene Shipper, MD, Algernon Huxley, RN, *   John,  Thanks for reaching out. I agree that a very subtle stricture may be present (reading history seems he has a history of alcohol consumption).  However in the setting of his normal liver tests would not necessarily move forward with an ERCP. I do think scheduling him as an EUS and possible ERCP is very reasonable so that we may exclude chronic pancreatitis or an ampullary/distal pancreatic head lesion. Linda or [employer] or covering RN please schedule an EUS plus ERCP in the next 4-6 weeks for the patient. He should have a stat hepatic function panel performed on the day of his procedure. Thanks. GM  ----- Message -----  From: Irene Shipper, MD  Sent: 09/20/2018 11:47 AM EDT  To: Algernon Huxley, RN, Irving Copas., MD   Please let the patient know that his examination reveals nonspecific tapering of his distal bile duct. Though this may represent a stricture, it may also be normal. His liver tests are normal. No evidence of stones in the bile duct (only the gallbladder as previously known) or pancreatic abnormality. To further evaluate his distal bile duct, I would recommend EUS (with option for ERCP at that time). I will forward to Dr. Rush Landmark for his opinion when he returns next week. Thank you       Result Notes for MR ABDOMEN MRCP W WO CONTAST  Notes recorded by Timothy Lasso, RN  on 09/26/2018 at 9:09 AM EDT  Message to call pt in 4-6 weeks to set up procedure  ------   Notes recorded by Algernon Huxley, RN on 09/20/2018 at 11:54 AM EDT  Spoke with pts wife and she is aware and knows we will be back in touch once Dr. Rush Landmark reviews the report.  ------   Notes recorded by Irene Shipper, MD on 09/20/2018 at 11:47 AM EDT  Please let the patient know that his examination reveals nonspecific tapering of his distal bile duct. Though this may represent a stricture, it may also be normal. His liver tests are normal. No evidence of stones in the bile duct (only the gallbladder as previously known) or pancreatic abnormality. To further evaluate his distal bile duct, I would recommend EUS (with option for ERCP at that time). I will forward to Dr. Rush Landmark for his opinion when he returns next week. Thank you

## 2018-11-08 ENCOUNTER — Other Ambulatory Visit: Payer: Self-pay

## 2018-11-08 DIAGNOSIS — K838 Other specified diseases of biliary tract: Secondary | ICD-10-CM

## 2018-11-08 NOTE — Telephone Encounter (Signed)
Appt to be scheduled for 8/12 with Dr Jerilynn Mages at [facility] Left message on machine to call back

## 2018-11-08 NOTE — Telephone Encounter (Signed)
appt made for 8/12 at [facility] 12 noon with Dr Jerilynn Mages COVID testing on 12/03/18 930 to 12:45 pm Pt needs to have labs this week.   Left message on machine to call back

## 2018-11-10 NOTE — Telephone Encounter (Signed)
Left message on machine to call back  

## 2018-11-10 NOTE — Telephone Encounter (Signed)
The patient has been notified of this information and all questions answered. The pt has been advised of the information and verbalized understanding.   He will have labs this week and understands the COVID testing.

## 2018-11-29 ENCOUNTER — Other Ambulatory Visit (INDEPENDENT_AMBULATORY_CARE_PROVIDER_SITE_OTHER): Payer: Medicare Other

## 2018-11-29 DIAGNOSIS — K838 Other specified diseases of biliary tract: Secondary | ICD-10-CM | POA: Diagnosis not present

## 2018-11-29 LAB — HEPATIC FUNCTION PANEL
ALT: 11 U/L (ref 0–53)
AST: 17 U/L (ref 0–37)
Albumin: 4.2 g/dL (ref 3.5–5.2)
Alkaline Phosphatase: 47 U/L (ref 39–117)
Bilirubin, Direct: 0.2 mg/dL (ref 0.0–0.3)
Total Bilirubin: 0.6 mg/dL (ref 0.2–1.2)
Total Protein: 6.2 g/dL (ref 6.0–8.3)

## 2018-12-03 ENCOUNTER — Other Ambulatory Visit (HOSPITAL_COMMUNITY)
Admission: RE | Admit: 2018-12-03 | Discharge: 2018-12-03 | Disposition: A | Discharge status: Home / Self Care | Payer: Medicare Other | Source: Ambulatory Visit | Attending: Gastroenterology | Admitting: Gastroenterology

## 2018-12-03 DIAGNOSIS — Z20828 Contact with and (suspected) exposure to other viral communicable diseases: Secondary | ICD-10-CM | POA: Insufficient documentation

## 2018-12-03 DIAGNOSIS — Z01812 Encounter for preprocedural laboratory examination: Secondary | ICD-10-CM | POA: Insufficient documentation

## 2018-12-03 LAB — SARS CORONAVIRUS 2 (TAT 6-24 HRS): SARS Coronavirus 2: NEGATIVE

## 2018-12-07 ENCOUNTER — Ambulatory Visit (HOSPITAL_COMMUNITY)
Admission: RE | Admit: 2018-12-07 | Discharge: 2018-12-07 | Disposition: A | Discharge status: Home / Self Care | Payer: Medicare Other | Attending: Gastroenterology | Admitting: Gastroenterology

## 2018-12-07 ENCOUNTER — Encounter (HOSPITAL_COMMUNITY): Payer: Self-pay | Admitting: Gastroenterology

## 2018-12-07 ENCOUNTER — Ambulatory Visit (HOSPITAL_COMMUNITY): Payer: Medicare Other

## 2018-12-07 ENCOUNTER — Ambulatory Visit (HOSPITAL_COMMUNITY): Payer: Medicare Other | Admitting: Anesthesiology

## 2018-12-07 ENCOUNTER — Other Ambulatory Visit: Payer: Self-pay

## 2018-12-07 ENCOUNTER — Encounter (HOSPITAL_COMMUNITY)
Admission: RE | Disposition: A | Discharge status: Home / Self Care | Payer: Self-pay | Source: Home / Self Care | Attending: Gastroenterology

## 2018-12-07 DIAGNOSIS — M8580 Other specified disorders of bone density and structure, unspecified site: Secondary | ICD-10-CM | POA: Diagnosis not present

## 2018-12-07 DIAGNOSIS — K208 Other esophagitis: Secondary | ICD-10-CM | POA: Diagnosis not present

## 2018-12-07 DIAGNOSIS — F319 Bipolar disorder, unspecified: Secondary | ICD-10-CM | POA: Insufficient documentation

## 2018-12-07 DIAGNOSIS — K8065 Calculus of gallbladder and bile duct with chronic cholecystitis with obstruction: Secondary | ICD-10-CM | POA: Diagnosis not present

## 2018-12-07 DIAGNOSIS — K219 Gastro-esophageal reflux disease without esophagitis: Secondary | ICD-10-CM | POA: Diagnosis not present

## 2018-12-07 DIAGNOSIS — F1021 Alcohol dependence, in remission: Secondary | ICD-10-CM | POA: Diagnosis not present

## 2018-12-07 DIAGNOSIS — K2270 Barrett's esophagus without dysplasia: Secondary | ICD-10-CM | POA: Insufficient documentation

## 2018-12-07 DIAGNOSIS — K8050 Calculus of bile duct without cholangitis or cholecystitis without obstruction: Secondary | ICD-10-CM

## 2018-12-07 DIAGNOSIS — E039 Hypothyroidism, unspecified: Secondary | ICD-10-CM | POA: Diagnosis not present

## 2018-12-07 DIAGNOSIS — I899 Noninfective disorder of lymphatic vessels and lymph nodes, unspecified: Secondary | ICD-10-CM | POA: Diagnosis not present

## 2018-12-07 DIAGNOSIS — Z85820 Personal history of malignant melanoma of skin: Secondary | ICD-10-CM | POA: Diagnosis not present

## 2018-12-07 DIAGNOSIS — K838 Other specified diseases of biliary tract: Secondary | ICD-10-CM | POA: Insufficient documentation

## 2018-12-07 DIAGNOSIS — Z8719 Personal history of other diseases of the digestive system: Secondary | ICD-10-CM | POA: Diagnosis not present

## 2018-12-07 DIAGNOSIS — K449 Diaphragmatic hernia without obstruction or gangrene: Secondary | ICD-10-CM | POA: Diagnosis not present

## 2018-12-07 DIAGNOSIS — R932 Abnormal findings on diagnostic imaging of liver and biliary tract: Secondary | ICD-10-CM | POA: Diagnosis present

## 2018-12-07 DIAGNOSIS — Z91041 Radiographic dye allergy status: Secondary | ICD-10-CM | POA: Insufficient documentation

## 2018-12-07 DIAGNOSIS — K831 Obstruction of bile duct: Secondary | ICD-10-CM

## 2018-12-07 HISTORY — PX: BILIARY DILATION: SHX6850

## 2018-12-07 HISTORY — PX: BIOPSY: SHX5522

## 2018-12-07 HISTORY — PX: BILIARY BRUSHING: SHX6843

## 2018-12-07 HISTORY — PX: ERCP: SHX5425

## 2018-12-07 HISTORY — PX: ESOPHAGOGASTRODUODENOSCOPY (EGD) WITH PROPOFOL: SHX5813

## 2018-12-07 HISTORY — PX: EUS: SHX5427

## 2018-12-07 HISTORY — PX: REMOVAL OF STONES: SHX5545

## 2018-12-07 LAB — CBC
HCT: 40 % (ref 39.0–52.0)
Hemoglobin: 14.1 g/dL (ref 13.0–17.0)
MCH: 34.2 pg — ABNORMAL HIGH (ref 26.0–34.0)
MCHC: 35.3 g/dL (ref 30.0–36.0)
MCV: 97.1 fL (ref 80.0–100.0)
Platelets: 172 10*3/uL (ref 150–400)
RBC: 4.12 MIL/uL — ABNORMAL LOW (ref 4.22–5.81)
RDW: 12.9 % (ref 11.5–15.5)
WBC: 6.7 10*3/uL (ref 4.0–10.5)
nRBC: 0 % (ref 0.0–0.2)

## 2018-12-07 LAB — HEPATIC FUNCTION PANEL
ALT: 19 U/L (ref 0–44)
AST: 23 U/L (ref 15–41)
Albumin: 4.5 g/dL (ref 3.5–5.0)
Alkaline Phosphatase: 49 U/L (ref 38–126)
Bilirubin, Direct: 0.2 mg/dL (ref 0.0–0.2)
Indirect Bilirubin: 0.8 mg/dL (ref 0.3–0.9)
Total Bilirubin: 1 mg/dL (ref 0.3–1.2)
Total Protein: 7.4 g/dL (ref 6.5–8.1)

## 2018-12-07 SURGERY — ESOPHAGOGASTRODUODENOSCOPY (EGD) WITH PROPOFOL
Anesthesia: General

## 2018-12-07 MED ORDER — LIDOCAINE 2% (20 MG/ML) 5 ML SYRINGE
INTRAMUSCULAR | Status: DC | PRN
  Administered 2018-12-07: 50 mg via INTRAVENOUS

## 2018-12-07 MED ORDER — METHYLPREDNISOLONE SODIUM SUCC 125 MG IJ SOLR
INTRAMUSCULAR | Status: AC
  Filled 2018-12-07: qty 2

## 2018-12-07 MED ORDER — SODIUM CHLORIDE 0.9 % IV SOLN
INTRAVENOUS | Status: DC | PRN
  Administered 2018-12-07: 18 mL

## 2018-12-07 MED ORDER — INDOMETHACIN 50 MG RE SUPP
RECTAL | Status: DC | PRN
  Administered 2018-12-07: 100 mg via RECTAL

## 2018-12-07 MED ORDER — FENTANYL CITRATE (PF) 100 MCG/2ML IJ SOLN
INTRAMUSCULAR | Status: DC | PRN
  Administered 2018-12-07 (×2): 50 ug via INTRAVENOUS

## 2018-12-07 MED ORDER — PROPOFOL 10 MG/ML IV BOLUS
INTRAVENOUS | Status: DC | PRN
  Administered 2018-12-07: 180 mg via INTRAVENOUS

## 2018-12-07 MED ORDER — DEXAMETHASONE SODIUM PHOSPHATE 10 MG/ML IJ SOLN
INTRAMUSCULAR | Status: DC | PRN
  Administered 2018-12-07: 8 mg via INTRAVENOUS

## 2018-12-07 MED ORDER — ONDANSETRON HCL 4 MG/2ML IJ SOLN
INTRAMUSCULAR | Status: DC | PRN
  Administered 2018-12-07: 4 mg via INTRAVENOUS

## 2018-12-07 MED ORDER — CIPROFLOXACIN IN D5W 400 MG/200ML IV SOLN
INTRAVENOUS | Status: AC
  Filled 2018-12-07: qty 200

## 2018-12-07 MED ORDER — LACTATED RINGERS IV SOLN
INTRAVENOUS | Status: DC
  Administered 2018-12-07: 12:00:00 via INTRAVENOUS

## 2018-12-07 MED ORDER — INDOMETHACIN 50 MG RE SUPP
RECTAL | Status: AC
  Filled 2018-12-07: qty 2

## 2018-12-07 MED ORDER — GLUCAGON HCL RDNA (DIAGNOSTIC) 1 MG IJ SOLR
INTRAMUSCULAR | Status: AC
  Filled 2018-12-07: qty 1

## 2018-12-07 MED ORDER — SODIUM CHLORIDE 0.9 % IV SOLN: INTRAVENOUS | Status: DC

## 2018-12-07 MED ORDER — CIPROFLOXACIN IN D5W 400 MG/200ML IV SOLN
INTRAVENOUS | Status: DC | PRN
  Administered 2018-12-07: 400 mg via INTRAVENOUS

## 2018-12-07 MED ORDER — ROCURONIUM BROMIDE 10 MG/ML (PF) SYRINGE
PREFILLED_SYRINGE | INTRAVENOUS | Status: DC | PRN
  Administered 2018-12-07: 50 mg via INTRAVENOUS

## 2018-12-07 MED ORDER — DIPHENHYDRAMINE HCL 50 MG/ML IJ SOLN
INTRAMUSCULAR | Status: DC | PRN
  Administered 2018-12-07: 12.5 mg via INTRAVENOUS

## 2018-12-07 MED ORDER — METHYLPREDNISOLONE SODIUM SUCC 125 MG IJ SOLR
INTRAMUSCULAR | Status: DC | PRN
  Administered 2018-12-07: 60 mg via INTRAVENOUS

## 2018-12-07 MED ORDER — ESOMEPRAZOLE MAGNESIUM 40 MG PO CPDR: 40.0000 mg | DELAYED_RELEASE_CAPSULE | Freq: Every day | ORAL | 3 refills | Status: DC

## 2018-12-07 MED ORDER — PROPOFOL 10 MG/ML IV BOLUS
INTRAVENOUS | Status: AC
  Filled 2018-12-07: qty 20

## 2018-12-07 MED ORDER — GLUCAGON HCL RDNA (DIAGNOSTIC) 1 MG IJ SOLR
INTRAMUSCULAR | Status: DC | PRN
  Administered 2018-12-07 (×2): 0.25 mg via INTRAVENOUS

## 2018-12-07 MED ORDER — FENTANYL CITRATE (PF) 100 MCG/2ML IJ SOLN
INTRAMUSCULAR | Status: AC
  Filled 2018-12-07: qty 2

## 2018-12-07 SURGICAL SUPPLY — 15 items

## 2018-12-07 NOTE — Op Note (Signed)
[facility] Patient Name: Jeffrey Acosta Procedure Date: 12/07/2018 MRN: [id number] Attending MD: Justice Britain , MD Date of Birth: March 26, 1951 CSN: [id number] Age: 68 Admit Type: Outpatient Procedure:                ERCP Indications:              Abnormal MRCP, Abnormal endoscopic ultrasound of                            the biliary system, Biliary dilation on magnetic                            resonance cholangiopancreatography, Evaluation and                            possible treatment of bile duct stone(s) Providers:                Justice Britain, MD Referring MD:             Docia Chuck. Henrene Pastor, MD Medicines:                General Anesthesia, Diphenhydramine 25 mg IV,                            Solumedrol 60 mg , Indomethacin 100 mg PR, Cipro                            155 mg IV Complications:            No immediate complications. Estimated Blood Loss:     Estimated blood loss was minimal. Procedure:                Pre-Anesthesia Assessment:                           - Prior to the procedure, a History and Physical                            was performed, and patient medications and                            allergies were reviewed. The patient's tolerance of                            previous anesthesia was also reviewed. The risks                            and benefits of the procedure and the sedation                            options and risks were discussed with the patient.                            All questions were answered, and informed consent  was obtained. Prior Anticoagulants: The patient has                            taken no previous anticoagulant or antiplatelet                            agents except for aspirin. ASA Grade Assessment:                            III - A patient with severe systemic disease. After                            reviewing the risks and benefits, the patient was          deemed in satisfactory condition to undergo the                            procedure.                           After obtaining informed consent, the scope was                            passed under direct vision. Throughout the                            procedure, the patient's blood pressure, pulse, and                            oxygen saturations were monitored continuously. The                            TJF-Q180V ([id number]) Olympus duodenoscope was                            introduced through the mouth, and used to inject                            contrast into and used to inject contrast into the                            bile duct. The ERCP was accomplished without                            difficulty. The patient tolerated the procedure. Scope In: Scope Out: Findings:      The scout film was normal.      The esophagus was successfully intubated under direct vision without       detailed examination of the pharynx, larynx, and associated structures,       and upper GI tract as it had just been done during EUS. The major       papilla was small.      A short 0.035 inch Soft Jagwire was passed into the biliary tree. The       Autotome sphincterotome was passed over the guidewire and the bile duct  was then deeply cannulated. Contrast was injected. I personally       interpreted the bile duct images. Ductal flow of contrast was adequate.       Image quality was adequate. Contrast extended to the hepatic ducts. The       main bile duct was moderately dilated. The largest diameter was 11 mm. A       7 mm biliary sphincterotomy was made with a monofilament Autotome       sphincterotome using ERBE electrocautery. There was self limited oozing       from the sphincterotomy which did not require treatment. Contrast flow       after sphincterotomy was slow with a mild narrowing at the distal       CBD/biliary takeoff was noted. To aid in drainage, dilation of the        distal common bile duct with an 12-03-08 mm balloon (to a maximum balloon       size of 10 mm) dilator was successful as a sphincteroplasty for a total       of 4 minutes. To discover objects, the biliary tree was swept with a       retrieval balloon starting at the bifurcation. Sludge was swept from the       duct. Cells for cytology were obtained by brushing in the biliary       pancreatic junction and the lower third of the main bile duct. An       occlusion cholangiogram was performed that showed no further significant       biliary pathology and the gallbladder began to fill. Flow of bile was       much improved after biliary dilation.      A pancreatogram was not performed.      The duodenoscope was withdrawn from the patient. Impression:               - The major papilla appeared to be small.                           - The entire main bile duct was moderately dilated.                           - A biliary sphincterotomy was performed.                            Sphincteroplasty was performed.                           - Common bile duct was successfully dilated.                           - The biliary tree was swept and sludge was found.                            No overt stone noted.                           - Cells for cytology obtained at the biliary  pancreatic junction and in the lower third of the                            main duct. Moderate Sedation:      Not Applicable - Patient had care per Anesthesia. Recommendation:           - The patient will be observed post-procedure,                            until all discharge criteria are met.                           - Discharge patient to home.                           - Patient has a contact number available for                            emergencies. The signs and symptoms of potential                            delayed complications were discussed with the                            patient.  Return to normal activities tomorrow.                            Written discharge instructions were provided to the                            patient.                           - Observe patient's clinical course.                           - Check liver enzymes (AST, ALT, alkaline                            phosphatase, bilirubin) in 2 weeks.                           - Watch for pancreatitis, bleeding, perforation,                            and cholangitis.                           - Await cytology results.                           - Rerturn to care of Dr. Henrene Pastor for follow up.                           - Although no overt biliary stones were noted  within the bile duct, biliary sludge was removed.                            Brushings pending. His overall clinical scenario                            does not suggest overt biliary colic, but                            consideration of possible cholecystectomy should be                            had at some point in the future.                           - The findings and recommendations were discussed                            with the patient.                           - The findings and recommendations were discussed                            with the patient's family. Procedure Code(s):        --- Professional ---                           [phone], Endoscopic retrograde                            cholangiopancreatography (ERCP); with removal of                            calculi/debris from biliary/pancreatic duct(s) Diagnosis Code(s):        --- Professional ---                           K83.8, Other specified diseases of biliary tract                           R93.2, Abnormal findings on diagnostic imaging of                            liver and biliary tract                           K83.9, Disease of biliary tract, unspecified                           K80.50, Calculus of bile duct without  cholangitis                            or cholecystitis without obstruction CPT copyright 2019 [employer] Medical Association. All rights reserved. The codes documented in this report are preliminary and upon coder review  may  be revised to meet current compliance requirements. Justice Britain, MD 12/07/2018 5:42:45 PM Number of Addenda: 0

## 2018-12-07 NOTE — Transfer of Care (Signed)
Immediate Anesthesia Transfer of Care Note  Patient: Jeffrey Acosta  Procedure(s) Performed: Procedure(s): ESOPHAGOGASTRODUODENOSCOPY (EGD) WITH PROPOFOL (N/A) UPPER ENDOSCOPIC ULTRASOUND (EUS) RADIAL (N/A) ENDOSCOPIC RETROGRADE CHOLANGIOPANCREATOGRAPHY (ERCP) (N/A) BILIARY BRUSHING REMOVAL OF STONES BIOPSY BILIARY DILATION  Patient Location: PACU  Anesthesia Type:General  Level of Consciousness:  sedated, patient cooperative and responds to stimulation  Airway & Oxygen Therapy:Patient Spontanous Breathing and Patient connected to face mask oxgen  Post-op Assessment:  Report given to PACU RN and Post -op Vital signs reviewed and stable  Post vital signs:  Reviewed and stable  Last Vitals:  Vitals:   12/07/18 1439 12/07/18 1440  BP: (!) 147/123 (!) 147/123  Pulse: 74 71  Resp: 14 10  Temp: 36.5 C   SpO2: 825% 189%    Complications: No apparent anesthesia complications

## 2018-12-07 NOTE — H&P (Signed)
GASTROENTEROLOGY OUTPATIENT PROCEDURE H&P NOTE   Primary Care Physician: Colon Branch, MD  HPI: Jeffrey Acosta is a 68 y.o. male who presents for EGD/EUS/ERCP  Past Medical History:  Diagnosis Date  . Alcoholism in remission ([place])    in [place]  . Bipolar affective disorder ([place])   . Diverticulosis of colon (without mention of hemorrhage)   . GERD (gastroesophageal reflux disease)   . Gout of multiple sites   . Hiatal hernia   . Hx of migraines   . Hydrocele   . HYPERLIPIDEMIA 01/10/2009   Qualifier: Diagnosis of  By: Ronnald Ramp CMA, [place]    NMR Lipoprofile 2011 LDL could not be calculated  Due to TG of 889  (811  / 750 ),  HDL 50 . Father MI @ 24 PGM CVA early 58s; PGF CVA @72    . Hypothyroidism   . Melanoma of skin, site unspecified 11/25/2012   07/2011 abdominal  Stage 1 Dr Amy [place] Dr Sarajane Jews   . Osteopenia   . Polyarthralgia    Past Surgical History:  Procedure Laterality Date  . COLONOSCOPY  2003   negative ; Dr Sharlett Iles  . INGUINAL HERNIA REPAIR  08/07/2011   Procedure: HERNIA REPAIR INGUINAL ADULT;  Surgeon: Rolm Bookbinder, MD;  Location: [facility];  Service: General;  Laterality: Right;  . MELANOMA EXCISION  07/28/11   Dr Sarajane Jews; stomach and chest  . TONSILLECTOMY  1951  . 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 UPPER GASTROINTESTINAL ENDOSCOPY  1983   hiatal hernia  . VASECTOMY  1979   Current Facility-Administered Medications  Medication Dose Route Frequency Provider Last Rate Last Dose  . 0.9 %  sodium chloride infusion   Intravenous Continuous Mansouraty, Telford Nab., MD      . lactated ringers infusion   Intravenous Continuous Mansouraty, Telford Nab., MD       Allergies  Allergen Reactions  . Omnipaque [Iohexol] Hives     Desc: developed hives after injection; resolved with 50 mg benadryl given to pt.    Family History  Problem Relation Age of Onset  . COPD Father   . Heart attack Father 76  . COPD Mother   . Diabetes  Paternal Grandmother   . Parkinsonism Paternal Grandmother   . Stroke Paternal Grandmother 19  . Stroke Maternal Grandfather 72  . Lung cancer Paternal Grandfather        Heavy smoker  . Colon cancer Neg Hx   . Prostate cancer Neg Hx    Social History   Socioeconomic History  . Marital status: Married    Spouse name: [name]  . Number of children: 1  . Years of education: Not on file  . Highest education level: Not on file  Occupational History  . Occupation: retired  Scientific laboratory technician  . Financial resource strain: Not on file  . Food insecurity    Worry: Not on file    Inability: Not on file  . Transportation needs    Medical: Not on file    Non-medical: Not on file  Tobacco Use  . Smoking status: Never Smoker  . Smokeless tobacco: Never Used  Substance and Sexual Activity  . Alcohol use: No    Alcohol/week: 0.0 standard drinks    Comment: former alcoholic;   in remission   . Drug use: No  . Sexual activity: Yes    Partners: Female    Comment: Having some issues with ED.  Would  like provider's recommendation.    Lifestyle  . Physical activity    Days per week: Not on file    Minutes per session: Not on file  . Stress: Not on file  Relationships  . Social Herbalist on phone: Not on file    Gets together: Not on file    Attends religious service: Not on file    Active member of club or organization: Not on file    Attends meetings of clubs or organizations: Not on file    Relationship status: Not on file  . Intimate partner violence    Fear of current or ex partner: Not on file    Emotionally abused: Not on file    Physically abused: Not on file    Forced sexual activity: Not on file  Other Topics Concern  . Not on file  Social History Narrative   1 child, 2 step children   Household- pt, wife    Leo Rod Sheridan (sister)    Physical Exam: Vital signs in last 24 hours:     GEN: NAD EYE: Sclerae anicteric ENT: MMM  RESP: CTAB posteriorly GI: Soft, NT/ND NEURO:  Alert & Oriented x 3  Lab Results: No results for input(s): WBC, HGB, HCT, PLT in the last 72 hours. BMET No results for input(s): NA, K, CL, CO2, GLUCOSE, BUN, CREATININE, CALCIUM in the last 72 hours. LFT No results for input(s): PROT, ALBUMIN, AST, ALT, ALKPHOS, BILITOT, BILIDIR, IBILI in the last 72 hours. PT/INR No results for input(s): LABPROT, INR in the last 72 hours.   Impression / Plan: This is a 68 y.o.male who presents for EGD/EUS/ERCP.  The risks and benefits of endoscopic evaluation were discussed with the patient; these include but are not limited to the risk of perforation, infection, bleeding, missed lesions, lack of diagnosis, severe illness requiring hospitalization, as well as anesthesia and sedation related illnesses.  The patient is agreeable to proceed.    Justice Britain, MD [facility] Office # [phone]

## 2018-12-07 NOTE — Anesthesia Postprocedure Evaluation (Signed)
Anesthesia Post Note  Patient: Jeffrey Acosta  Procedure(s) Performed: ESOPHAGOGASTRODUODENOSCOPY (EGD) WITH PROPOFOL (N/A ) UPPER ENDOSCOPIC ULTRASOUND (EUS) RADIAL (N/A ) ENDOSCOPIC RETROGRADE CHOLANGIOPANCREATOGRAPHY (ERCP) (N/A ) BILIARY BRUSHING REMOVAL OF STONES BIOPSY BILIARY DILATION     Patient location during evaluation: PACU Anesthesia Type: General Level of consciousness: awake and alert Pain management: pain level controlled Vital Signs Assessment: post-procedure vital signs reviewed and stable Respiratory status: spontaneous breathing, nonlabored ventilation, respiratory function stable and patient connected to nasal cannula oxygen Cardiovascular status: blood pressure returned to baseline and stable Postop Assessment: no apparent nausea or vomiting Anesthetic complications: no    Last Vitals:  Vitals:   12/07/18 1510 12/07/18 1520  BP: (!) 186/89 (!) 168/85  Pulse: 67 68  Resp: 14 19  Temp:    SpO2: 100% 100%    Last Pain:  Vitals:   12/07/18 1520  TempSrc:   PainSc: 0-No pain                 Tiajuana Amass

## 2018-12-07 NOTE — Discharge Instructions (Signed)
YOU HAD AN ENDOSCOPIC PROCEDURE TODAY: Refer to the procedure report and other information in the discharge instructions given to you for any specific questions about what was found during the examination. If this information does not answer your questions, please call [facility] office at [phone] to clarify.  ° °YOU SHOULD EXPECT: Some feelings of bloating in the abdomen. Passage of more gas than usual. Walking can help get rid of the air that was put into your GI tract during the procedure and reduce the bloating.  ° °DIET: Your first meal following the procedure should be a light meal and then it is ok to progress to your normal diet. A half-sandwich or bowl of soup is an example of a good first meal. Heavy or fried foods are harder to digest and may make you feel nauseous or bloated. Drink plenty of fluids but you should avoid alcoholic beverages for 24 hours. ° °ACTIVITY: Your care partner should take you home directly after the procedure. You should plan to take it easy, moving slowly for the rest of the day. You can resume normal activity the day after the procedure however YOU SHOULD NOT DRIVE, use power tools, machinery or perform tasks that involve climbing or major physical exertion for 24 hours (because of the sedation medicines used during the test).  ° °SYMPTOMS TO REPORT IMMEDIATELY: °A gastroenterologist can be reached at any hour. Please call [phone]  for any of the following symptoms:  ° °Following upper endoscopy (EGD, EUS, ERCP, esophageal dilation) °Vomiting of blood or coffee ground material  °New, significant abdominal pain  °New, significant chest pain or pain under the shoulder blades  °Painful or persistently difficult swallowing  °New shortness of breath  °Black, tarry-looking or red, bloody stools ° °FOLLOW UP:  °If any biopsies were taken you will be contacted by phone or by letter within the next 1-3 weeks. Call [phone]  if you have not heard about the biopsies in 3 weeks.    °Please also call with any specific questions about appointments or follow up tests. ° °

## 2018-12-07 NOTE — Anesthesia Procedure Notes (Signed)
Procedure Name: Intubation Date/Time: 12/07/2018 12:15 PM Performed by: Anne Fu, CRNA Pre-anesthesia Checklist: Patient identified, Emergency Drugs available, Suction available, Patient being monitored and Timeout performed Patient Re-evaluated:Patient Re-evaluated prior to induction Oxygen Delivery Method: Circle system utilized Preoxygenation: Pre-oxygenation with 100% oxygen Induction Type: IV induction Ventilation: Mask ventilation without difficulty Laryngoscope Size: Mac and 4 Grade View: Grade II Tube type: Oral Tube size: 7.5 mm Number of attempts: 1 Airway Equipment and Method: Stylet Placement Confirmation: ETT inserted through vocal cords under direct vision,  positive ETCO2 and breath sounds checked- equal and bilateral Secured at: 21 cm Tube secured with: Tape Dental Injury: Teeth and Oropharynx as per pre-operative assessment

## 2018-12-07 NOTE — Op Note (Signed)
[facility] Patient Name: Jeffrey Acosta Procedure Date: 12/07/2018 MRN: [id number] Attending MD: Justice Britain , MD Date of Birth: Jun 13, 1950 CSN: [id number] Age: 68 Admit Type: Outpatient Procedure:                Upper EUS Indications:              Common bile duct dilation (acquired) seen on MRCP,                            Abnormal MRCP Providers:                Justice Britain, MD, Elmer Ramp. Tilden Dome, RN, Burtis Junes, RN, Elspeth Cho Tech., Technician, Anne Fu CRNA, CRNA Referring MD:             Docia Chuck. Henrene Pastor, MD Medicines:                General Anesthesia Complications:            No immediate complications. Estimated Blood Loss:     Estimated blood loss was minimal. Procedure:                Pre-Anesthesia Assessment:                           - Prior to the procedure, a History and Physical                            was performed, and patient medications and                            allergies were reviewed. The patient's tolerance of                            previous anesthesia was also reviewed. The risks                            and benefits of the procedure and the sedation                            options and risks were discussed with the patient.                            All questions were answered, and informed consent                            was obtained. Prior Anticoagulants: The patient has                            taken no previous anticoagulant or antiplatelet  agents except for aspirin. ASA Grade Assessment:                            III - A patient with severe systemic disease. After                            reviewing the risks and benefits, the patient was                            deemed in satisfactory condition to undergo the                            procedure.                           After obtaining informed consent, the  endoscope was                            passed under direct vision. Throughout the                            procedure, the patient's blood pressure, pulse, and                            oxygen saturations were monitored continuously. The                            GIF-H190 ([id number]) Olympus gastroscope was                            introduced through the mouth, and advanced to the                            second part of duodenum. The GF-UTC180 ([id number])                            Olympus Linear EUS was introduced through the                            mouth, and advanced to the duodenum for ultrasound                            examination from the stomach and duodenum. The                            upper EUS was accomplished without difficulty. The                            patient tolerated the procedure. Scope In: Scope Out: Findings:      ENDOSCOPIC FINDING: :      No gross lesions were noted in the proximal esophagus and in the mid       esophagus.      Tongues of salmon-colored mucosa were present, diffuse salmon-colored  mucosa was present from 30 to 40 cm and islands of salmon-colored mucosa       were present. Hiatal narrowing was identified at 42 cm. The maximum       longitudinal extent of these esophageal mucosal changes was 10 cm in       length. Biopsies were taken with a cold forceps for histology @ 30 cm.       Biopsies were taken with a cold forceps for histology @ 32 cm. Biopsies       were taken with a cold forceps for histology @ 34 cm. Biopsies were       taken with a cold forceps for histology @ 36 cm. Biopsies were taken       with a cold forceps for histology @ 38 cm. Biopsies were taken with a       cold forceps for histology @ 40 cm.      A small hiatal hernia was present.      No gross lesions were noted in the entire examined stomach.      No gross lesions were noted in the duodenal bulb, in the first portion       of the duodenum, in the  second portion of the duodenum and in the major       papilla.      ENDOSONOGRAPHIC FINDING: :      There was dilation in the common bile duct (9.7 mm -> 13.5 mm) and in       the common hepatic duct (10.3 mm).      One filling defect vs polypoid lesion noted in the distal CBD where       dilation occured near ampulla. This was non-shadowing and non-echogenic.       It was irregular in appearance.      Multiple stones were visualized endosonographically in the gallbladder.       The stones were irregular. They were hyperechoic and characterized by       shadowing.      There was no sign of significant endosonographic abnormality in the       pancreatic head (PD = 2.6 mm), genu of the pancreas (PD = 2.6 mm),       pancreatic body (PD = 0.7 mm) and pancreatic tail (PD = 1.1 mm). No       masses, no cysts, no calcifications, the pancreatic duct was regular in       contour.      Endosonographic imaging of the ampulla showed no intramural       (subepithelial) lesion.      Endosonographic imaging in the visualized portion of the liver showed no       mass.      No malignant-appearing lymph nodes were visualized in the celiac region       (level 20), perigastric region and peripancreatic region.      The celiac region was visualized. Impression:               EGD Impression:                           - No gross lesions in proximal/middle esophagus.                           - Salmon-colored mucosa suspicious for long-segment  Barrett's esophagus. Biopsied every 2 cm from 30 cm                            to 40 cm to rule out dysplasia and diagnosis of                            Barrett's to be confirmed.                           - Small hiatal hernia.                           - No gross lesions in the stomach.                           - No gross lesions in the duodenal bulb, in the                            first portion of the duodenum, in the second                             portion of the duodenum and in the major papilla.                           EUS Impression:                           - There was dilation in the common bile duct and in                            the common hepatic duct.                           - One non-echogenic filling defect noted in distal                            common bile duct at area of ductal dilation near                            ampulla.                           - Multiple stones were visualized                            endosonographically in the gallbladder.                           - There was no sign of significant pathology in the                            pancreatic head, genu of the pancreas, pancreatic  body and pancreatic tail.                           - No ampullary lesion noted.                           - No malignant-appearing lymph nodes were                            visualized in the celiac region (level 20),                            perigastric region and peripancreatic region. Moderate Sedation:      Not Applicable - Patient had care per Anesthesia. Recommendation:           - Proceed to ERCP for attempt at removal of                            polypoid lesion v non-echogenic stone.                           - Increase Nexium to 40 mg BID.                           - Await path results.                           - Further followup/surveillance/therapeutic needs                            to be determined based on biopsies of esophagus.                           - Liquid diet x 24 hours then soft diet x 48 hours                            then regular diet.                           - The findings and recommendations were discussed                            with the patient.                           - The findings and recommendations were discussed                            with the patient's family. Procedure Code(s):        --- Professional  ---                           [id number], Esophagogastroduodenoscopy, flexible,                            transoral; with endoscopic ultrasound examination  limited to the esophagus, stomach or duodenum, and                            adjacent structures                           43239, Esophagogastroduodenoscopy, flexible,                            transoral; with biopsy, single or multiple Diagnosis Code(s):        --- Professional ---                           K22.8, Other specified diseases of esophagus                           K44.9, Diaphragmatic hernia without obstruction or                            gangrene                           K83.8, Other specified diseases of biliary tract                           K80.50, Calculus of bile duct without cholangitis                            or cholecystitis without obstruction                           I89.9, Noninfective disorder of lymphatic vessels                            and lymph nodes, unspecified                           R93.2, Abnormal findings on diagnostic imaging of                            liver and biliary tract CPT copyright 2019 American Medical Association. All rights reserved. The codes documented in this report are preliminary and upon coder review may  be revised to meet current compliance requirements. Justice Britain, MD 12/07/2018 2:47:56 PM Number of Addenda: 0

## 2018-12-07 NOTE — Anesthesia Preprocedure Evaluation (Addendum)
Anesthesia Evaluation  Patient identified by MRN, date of birth, ID band Patient awake    Reviewed: Allergy & Precautions, NPO status , Patient's Chart, lab work & pertinent test results  Airway Mallampati: II  TM Distance: >3 FB Neck ROM: Full    Dental  (+) Teeth Intact, Dental Advisory Given   Pulmonary    breath sounds clear to auscultation       Cardiovascular  Rhythm:Regular Rate:Normal     Neuro/Psych    GI/Hepatic   Endo/Other    Renal/GU      Musculoskeletal   Abdominal   Peds  Hematology   Anesthesia Other Findings   Reproductive/Obstetrics                             Anesthesia Physical Anesthesia Plan  ASA: II  Anesthesia Plan: General   Post-op Pain Management:    Induction: Intravenous  PONV Risk Score and Plan: Ondansetron and Dexamethasone  Airway Management Planned: Oral ETT  Additional Equipment:   Intra-op Plan:   Post-operative Plan: Extubation in OR  Informed Consent: I have reviewed the patients History and Physical, chart, labs and discussed the procedure including the risks, benefits and alternatives for the proposed anesthesia with the patient or authorized representative who has indicated his/her understanding and acceptance.     Dental advisory given  Plan Discussed with: CRNA and Anesthesiologist  Anesthesia Plan Comments:         Anesthesia Quick Evaluation  

## 2018-12-08 ENCOUNTER — Telehealth: Payer: Self-pay

## 2018-12-08 DIAGNOSIS — K838 Other specified diseases of biliary tract: Secondary | ICD-10-CM

## 2018-12-08 NOTE — Telephone Encounter (Signed)
Lab order in Epic and pt advised to come in 2 weeks to repeat.  Records also sent to the requested MD's

## 2018-12-08 NOTE — Telephone Encounter (Signed)
Mansouraty, Telford Nab., MD  Irene Shipper, MD; Timothy Lasso, RN        John,  Interesting case for sure.  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 I did biopsies every 2 cm so we can distinguish his surveillance needs, but I wouldn't be surprised if we find some HGD. We can discuss RFA if he needs it. I upped his PPI as well for the next few months.  I saw a non-echogenic filling defect vs polypoid lesion at the distal insertion of CBD and with subsequent dilation proximally. No ampullary lesion or Pancreatic lesion.  ERCP can be reviewed, but flow was slow after sphincterotomy so sphincteroplasty really opened things up.  Only sludge was removed no echogenic or dark pigmented stones.  Not sure cholecystectomy absolutely required as it seems he does not have biliary colic, but may want to consider referral for the sludge development.  I'll see what the EGD biopsies and the biliary brushings show.  Let's get a HFP in 2-weeks and see where things stand there.  Happy to discuss things further when you return.  Oleva Koo, please place a HFP lab order for 2-weeks from now.  Thanks.  Gabe    Mansouraty, Telford Nab., MD  Timothy Lasso, RN        Fotios Amos,  The patient asked that we send the EUS and ERCP reports to the following individuals, could you work on that for me?  Thanks.   Dr. Vedia Coffer  Dr. Jacqulyn Bath Pax.  Dr. Henrene Pastor  Dr. Chucky May (Psychiatrist) [phone] (Fax)

## 2018-12-08 NOTE — Telephone Encounter (Signed)
-----   Message from Irving Copas., MD sent at 12/07/2018  5:59 PM EDT ----- Jenny Reichmann, Interesting case for sure. 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  I did biopsies every 2 cm so we can distinguish his surveillance needs, but I wouldn't be surprised if we find some HGD.  We can discuss RFA if he needs it.  I upped his PPI as well for the next few months. I saw a non-echogenic filling defect vs polypoid lesion at the distal insertion of CBD and with subsequent dilation proximally.  No ampullary lesion or Pancreatic lesion. ERCP can be reviewed, but flow was slow after sphincterotomy so sphincteroplasty really opened things up. Only sludge was removed no echogenic or dark pigmented stones. Not sure cholecystectomy absolutely required as it seems he does not have biliary colic, but may want to consider referral for the sludge development. I'll see what the EGD biopsies and the biliary brushings show. Let's get a HFP in 2-weeks and see where things stand there. Happy to discuss things further when you return. Torrie Namba, please place a HFP lab order for 2-weeks from now. Thanks. Chester Holstein

## 2018-12-08 NOTE — Progress Notes (Signed)
The patient will follow-up with me in my office in 4 to 6 weeks post EUS, EGD with biopsies, ERCP with sphincterotomy.

## 2018-12-09 ENCOUNTER — Encounter (HOSPITAL_COMMUNITY): Payer: Self-pay | Admitting: Gastroenterology

## 2018-12-09 ENCOUNTER — Telehealth: Payer: Self-pay

## 2018-12-09 NOTE — Telephone Encounter (Signed)
appt made with Dr Henrene Pastor on 01/19/19 at 920 am.  The pt has been advised and The pt has been advised of the information and verbalized understanding.

## 2018-12-09 NOTE — Telephone Encounter (Signed)
-----   Message from Irene Shipper, MD sent at 12/08/2018  5:20 PM EDT ----- Jeffrey Acosta have reviewed the EUS and EGD/ERCP.  Agree with your initial impressions and plan.  Thanks for your help!Jeffrey Acosta,Please have the patient follow-up with me in my office in 4 to 6 weeks.  Thank youDr. Henrene Pastor ----- Message ----- From: Jeffrey Acosta., MD Sent: 12/07/2018   5:59 PM EDT To: Irene Shipper, MD, Jeffrey Lasso, RN  Jeffrey Acosta, Interesting case for sure. 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  I did biopsies every 2 cm so we can distinguish his surveillance needs, but I wouldn't be surprised if we find some HGD.  We can discuss RFA if he needs it.  I upped his PPI as well for the next few months. I saw a non-echogenic filling defect vs polypoid lesion at the distal insertion of CBD and with subsequent dilation proximally.  No ampullary lesion or Pancreatic lesion. ERCP can be reviewed, but flow was slow after sphincterotomy so sphincteroplasty really opened things up. Only sludge was removed no echogenic or dark pigmented stones. Not sure cholecystectomy absolutely required as it seems he does not have biliary colic, but may want to consider referral for the sludge development. I'll see what the EGD biopsies and the biliary brushings show. Let's get a HFP in 2-weeks and see where things stand there. Happy to discuss things further when you return. Jeffrey Acosta, please place a HFP lab order for 2-weeks from now. Thanks. Jeffrey Acosta

## 2018-12-12 ENCOUNTER — Encounter: Payer: Self-pay | Admitting: Gastroenterology

## 2019-01-05 ENCOUNTER — Ambulatory Visit: Payer: Self-pay | Admitting: Internal Medicine

## 2019-01-19 ENCOUNTER — Ambulatory Visit: Payer: Medicare Other | Admitting: Internal Medicine

## 2019-01-19 ENCOUNTER — Other Ambulatory Visit (INDEPENDENT_AMBULATORY_CARE_PROVIDER_SITE_OTHER): Payer: Medicare Other

## 2019-01-19 ENCOUNTER — Other Ambulatory Visit: Payer: Self-pay

## 2019-01-19 ENCOUNTER — Encounter: Payer: Self-pay | Admitting: Internal Medicine

## 2019-01-19 VITALS — BP 126/70 | HR 63 | Temp 97.8°F | Ht 73.0 in | Wt 207.0 lb

## 2019-01-19 DIAGNOSIS — K8020 Calculus of gallbladder without cholecystitis without obstruction: Secondary | ICD-10-CM

## 2019-01-19 DIAGNOSIS — K2270 Barrett's esophagus without dysplasia: Secondary | ICD-10-CM | POA: Diagnosis not present

## 2019-01-19 DIAGNOSIS — K219 Gastro-esophageal reflux disease without esophagitis: Secondary | ICD-10-CM | POA: Diagnosis not present

## 2019-01-19 DIAGNOSIS — K838 Other specified diseases of biliary tract: Secondary | ICD-10-CM

## 2019-01-19 LAB — HEPATIC FUNCTION PANEL
ALT: 12 U/L (ref 0–53)
AST: 21 U/L (ref 0–37)
Albumin: 4.4 g/dL (ref 3.5–5.2)
Alkaline Phosphatase: 60 U/L (ref 39–117)
Bilirubin, Direct: 0.1 mg/dL (ref 0.0–0.3)
Total Bilirubin: 0.8 mg/dL (ref 0.2–1.2)
Total Protein: 6.5 g/dL (ref 6.0–8.3)

## 2019-01-19 NOTE — Progress Notes (Signed)
HISTORY OF PRESENT ILLNESS:  Jeffrey Acosta is a 68 y.o. male with past medical history as listed below who was initially seen by me July 2015 for screening colonoscopy.  Examination revealed moderate left-sided diverticulosis but was otherwise normal.  Follow-up in 10 years recommended.  He was subsequently seen via telehealth medicine 12/13/2018 regarding bile duct dilation.  He was referred by hematology who was evaluating him for thrombocytopenia and macrocytosis.  An abdominal ultrasound was ordered to rule out cirrhosis and splenomegaly.  Examination revealed multiple gallstones and a bile duct that was dilated to 11 mm.  He was having no abdominal pain.  His liver tests were normal.  To further evaluate this issue he underwent MRI/MRCP Sep 20, 2018.  He was found to have mild diffuse dilation of the biliary tree without choledocholithiasis.  To have this further evaluated and rule out entities such as ampullary or pancreatic lesions or occult bile duct stone he underwent endoscopic ultrasound followed by ERCP with Dr. Rush Landmark.  Endoscopic ultrasound revealed moderate common bile duct dilation and a non-echogenic filling defect in the distal duct.  Multiple stones in the gallbladder.  No other abnormalities, including the pancreas.  ERCP with sphincterotomy was performed.  The biliary tree was swept with sludge but no overt stones.  Brushings of the pancreaticobiliary junction were negative for malignancy.  Patient did well after his procedures.  He was also noted at that time to have long segment Barrett's esophagus which was confirmed with biopsies.  No dysplasia.  His Nexium was increased from 20 mg daily to 40 mg daily.  He follows up at this time.  The patient tells me that he has no issues with dysphasia.  No family history of esophageal cancer.  His last blood work in August revealed normal liver test.  He continues to deny any abdominal pain.  He does report having had a relapse of his alcoholism  earlier this year but has now been abstinent for weeks.  REVIEW OF SYSTEMS:  All non-GI ROS negative unless otherwise stated in the HPI except for back pain and hearing problems  Past Medical History:  Diagnosis Date  . Alcoholism in remission ([place])    in [place]  . Bipolar affective disorder ([place])   . Diverticulosis of colon (without mention of hemorrhage)   . GERD (gastroesophageal reflux disease)   . Gout of multiple sites   . Hiatal hernia   . Hx of migraines   . Hydrocele   . HYPERLIPIDEMIA 01/10/2009   Qualifier: Diagnosis of  By: Ronnald Ramp CMA, [place]    NMR Lipoprofile 2011 LDL could not be calculated  Due to TG of 889  (811  / 750 ),  HDL 50 . Father MI @ 52 PGM CVA early 66s; PGF CVA @72    . Hypothyroidism   . Melanoma of skin, site unspecified 11/25/2012   07/2011 abdominal  Stage 1 Dr Amy [place] Dr Sarajane Jews   . Osteopenia   . Polyarthralgia     Past Surgical History:  Procedure Laterality Date  . BILIARY BRUSHING  12/07/2018   Procedure: BILIARY BRUSHING;  Surgeon: Rush Landmark Telford Nab., MD;  Location: [facility];  Service: Gastroenterology;;  . BILIARY DILATION  12/07/2018   Procedure: BILIARY DILATION;  Surgeon: Irving Copas., MD;  Location: [facility];  Service: Gastroenterology;;  . BIOPSY  12/07/2018   Procedure: BIOPSY;  Surgeon: Irving Copas., MD;  Location: [facility];  Service: Gastroenterology;;  . COLONOSCOPY  2003   negative ;  Dr Sharlett Iles  . ERCP N/A 12/07/2018   Procedure: ENDOSCOPIC RETROGRADE CHOLANGIOPANCREATOGRAPHY (ERCP);  Surgeon: Irving Copas., MD;  Location: [facility];  Service: Gastroenterology;  Laterality: N/A;  . ESOPHAGOGASTRODUODENOSCOPY (EGD) WITH PROPOFOL N/A 12/07/2018   Procedure: ESOPHAGOGASTRODUODENOSCOPY (EGD) WITH PROPOFOL;  Surgeon: Rush Landmark Telford Nab., MD;  Location: [facility];  Service: Gastroenterology;  Laterality: N/A;  . EUS N/A 12/07/2018   Procedure: UPPER ENDOSCOPIC ULTRASOUND (EUS)  RADIAL;  Surgeon: Rush Landmark Telford Nab., MD;  Location: [facility];  Service: Gastroenterology;  Laterality: N/A;  . INGUINAL HERNIA REPAIR  08/07/2011   Procedure: HERNIA REPAIR INGUINAL ADULT;  Surgeon: Rolm Bookbinder, MD;  Location: [facility];  Service: General;  Laterality: Right;  . MELANOMA EXCISION  07/28/11   Dr Sarajane Jews; stomach and chest  . REMOVAL OF STONES  12/07/2018   Procedure: REMOVAL OF STONES;  Surgeon: Rush Landmark Telford Nab., MD;  Location: [facility];  Service: Gastroenterology;;  . [name]  1953  . 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 UPPER GASTROINTESTINAL ENDOSCOPY  1983   hiatal hernia  . [place]  reports that he has never smoked. He has never used smokeless tobacco. He reports that he does not drink alcohol or use drugs.  family history includes COPD in his father and mother; Diabetes in his paternal grandmother; Heart attack (age of onset: 32) in his father; Lung cancer in his paternal grandfather; Parkinsonism in his paternal grandmother; Stroke (age of onset: 69) in his paternal grandmother; Stroke (age of onset: 36) in his maternal grandfather.  Allergies  Allergen Reactions  . Omnipaque [Iohexol] Hives     Desc: developed hives after injection; resolved with 50 mg benadryl given to pt.        PHYSICAL EXAMINATION: Vital signs: BP 126/70   Pulse 63   Temp 97.8 F (36.6 C)   Ht 6\' 1"  (1.854 m)   Wt 207 lb (93.9 kg)   BMI 27.31 kg/m   Constitutional: generally well-appearing, no acute distress Psychiatric: alert and oriented x3, cooperative Eyes: extraocular movements intact, anicteric, conjunctiva pink Mouth: oral pharynx moist, no lesions Neck: supple no lymphadenopathy Cardiovascular: heart regular rate and rhythm, no murmur Lungs: clear to auscultation bilaterally Abdomen: soft, nontender, nondistended, no obvious ascites, no peritoneal signs, normal bowel  sounds, no organomegaly Rectal: Omitted Extremities: no clubbing, cyanosis, or lower extremity edema bilaterally Skin: no lesions on visible extremities Neuro: No focal deficits.  Cranial nerves intact  ASSESSMENT:  1.  Chronic GERD. 2.  Long segment Barrett's esophagus.  No dysplasia on biopsies August 2020 3.  Mild to moderate biliary dilation without definitive cause found status post EUS and ERCP with sphincterotomy and negative brushings. 4.  Cholelithiasis 5.  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  No evidence of cirrhosis or portal hypertension.  Question alcohol effect 6.  History of alcoholism with relapse this year.  Abstinent in recent weeks  PLAN:  1.  Follow-up liver test today to assure ongoing normality 2.  Long discussion today on Barrett's esophagus.  Diagrams. 3.  Literature on Barrett's esophagus 4.  Continue Nexium 40 mg daily 5.  Given long segment nature repeat EGD with biopsies to rule out sampling error.The nature of the procedure, as well as the risks, benefits, and alternatives were carefully and thoroughly reviewed with the patient. Ample time for discussion and questions allowed. The patient understood, was satisfied, and agreed to proceed. 6.  We  discussed the role of cholecystectomy given his cholelithiasis and bile duct dilation and removal of minimal sludge.  He wants to hold off.  I think this is reasonable.  Would proceed to surgery though for any future biliary symptoms.  He agrees 25-minute spent face-to-face with the patient.  Greater than 50% of time used for counseling regarding his biliary dilation assessment, cholelithiasis, and GERD with long segment nondysplastic Barrett's esophagus.

## 2019-01-19 NOTE — Patient Instructions (Signed)
Your provider has requested that you go to the basement level for lab work before leaving today. Press "B" on the elevator. The lab is located at the first door on the left as you exit the elevator.  You have been scheduled for an endoscopy. Please follow written instructions given to you at your visit today. If you use inhalers (even only as needed), please bring them with you on the day of your procedure.   

## 2019-01-20 ENCOUNTER — Ambulatory Visit (INDEPENDENT_AMBULATORY_CARE_PROVIDER_SITE_OTHER): Payer: Medicare Other

## 2019-01-20 DIAGNOSIS — Z23 Encounter for immunization: Secondary | ICD-10-CM | POA: Diagnosis not present

## 2019-01-20 NOTE — Progress Notes (Signed)
Here for flu shot

## 2019-01-23 ENCOUNTER — Telehealth: Payer: Self-pay | Admitting: Internal Medicine

## 2019-01-23 ENCOUNTER — Encounter: Payer: Self-pay | Admitting: Internal Medicine

## 2019-01-23 NOTE — Telephone Encounter (Signed)
Spoke with pt and he is aware. 

## 2019-01-23 NOTE — Telephone Encounter (Signed)
Yes, because of the long segment of Barrett's, follow-up biopsies to rule out dysplasia.  We actually discussed this in the office.  Thanks

## 2019-01-23 NOTE — Telephone Encounter (Signed)
Pt states he had an EUS/ERCP in August. Pt had recent OV with Dr. Henrene Pastor and has been scheduled for EGD. Pt started wondering why he has been scheduled for an EGD this soon. Per OV note it looks like plan is to obtain more biopsies to exclude sampling error. Please advise.

## 2019-02-03 ENCOUNTER — Telehealth: Payer: Self-pay | Admitting: Internal Medicine

## 2019-02-03 NOTE — Telephone Encounter (Signed)

## 2019-02-06 ENCOUNTER — Ambulatory Visit (AMBULATORY_SURGERY_CENTER): Payer: Medicare Other | Admitting: Internal Medicine

## 2019-02-06 ENCOUNTER — Other Ambulatory Visit: Payer: Self-pay

## 2019-02-06 ENCOUNTER — Encounter: Payer: Self-pay | Admitting: Internal Medicine

## 2019-02-06 ENCOUNTER — Other Ambulatory Visit: Payer: Self-pay | Admitting: Internal Medicine

## 2019-02-06 VITALS — BP 152/85 | HR 67 | Temp 97.6°F | Resp 14 | Ht 73.0 in | Wt 207.0 lb

## 2019-02-06 DIAGNOSIS — K449 Diaphragmatic hernia without obstruction or gangrene: Secondary | ICD-10-CM | POA: Diagnosis not present

## 2019-02-06 DIAGNOSIS — K2270 Barrett's esophagus without dysplasia: Secondary | ICD-10-CM | POA: Diagnosis not present

## 2019-02-06 DIAGNOSIS — K2100 Gastro-esophageal reflux disease with esophagitis, without bleeding: Secondary | ICD-10-CM | POA: Diagnosis not present

## 2019-02-06 DIAGNOSIS — K219 Gastro-esophageal reflux disease without esophagitis: Secondary | ICD-10-CM | POA: Diagnosis not present

## 2019-02-06 DIAGNOSIS — F319 Bipolar disorder, unspecified: Secondary | ICD-10-CM | POA: Diagnosis not present

## 2019-02-06 MED ORDER — SODIUM CHLORIDE 0.9 % IV SOLN: 500.0000 mL | Freq: Once | INTRAVENOUS | Status: DC

## 2019-02-06 NOTE — Patient Instructions (Signed)
YOU HAD AN ENDOSCOPIC PROCEDURE TODAY AT THE [facility]:   Refer to the procedure report that was given to you for any specific questions about what was found during the examination.  If the procedure report does not answer your questions, please call your gastroenterologist to clarify.  If you requested that your care partner not be given the details of your procedure findings, then the procedure report has been included in a sealed envelope for you to review at your convenience later.  YOU SHOULD EXPECT: Some feelings of bloating in the abdomen. Passage of more gas than usual.  Walking can help get rid of the air that was put into your GI tract during the procedure and reduce the bloating. If you had a lower endoscopy (such as a colonoscopy or flexible sigmoidoscopy) you may notice spotting of blood in your stool or on the toilet paper. If you underwent a bowel prep for your procedure, you may not have a normal bowel movement for a few days.  Please Note:  You might notice some irritation and congestion in your nose or some drainage.  This is from the oxygen used during your procedure.  There is no need for concern and it should clear up in a day or so.  SYMPTOMS TO REPORT IMMEDIATELY:   Following upper endoscopy (EGD)  Vomiting of blood or coffee ground material  New chest pain or pain under the shoulder blades  Painful or persistently difficult swallowing  New shortness of breath  Fever of 100F or higher  Black, tarry-looking stools  For urgent or emergent issues, a gastroenterologist can be reached at any hour by calling [phone].   DIET:  We do recommend a small meal at first, but then you may proceed to your regular diet.  Drink plenty of fluids but you should avoid alcoholic beverages for 24 hours.  ACTIVITY:  You should plan to take it easy for the rest of today and you should NOT DRIVE or use heavy machinery until tomorrow (because of the sedation medicines used  during the test).    FOLLOW UP: Our staff will call the number listed on your records 48-72 hours following your procedure to check on you and address any questions or concerns that you may have regarding the information given to you following your procedure. If we do not reach you, we will leave a message.  We will attempt to reach you two times.  During this call, we will ask if you have developed any symptoms of COVID 19. If you develop any symptoms (ie: fever, flu-like symptoms, shortness of breath, cough etc.) before then, please call [phone].  If you test positive for Covid 19 in the 2 weeks post procedure, please call and report this information to us.    If any biopsies were taken you will be contacted by phone or by letter within the next 1-3 weeks.  Please call us at [phone] if you have not heard about the biopsies in 3 weeks.    SIGNATURES/CONFIDENTIALITY: You and/or your care partner have signed paperwork which will be entered into your electronic medical record.  These signatures attest to the fact that that the information above on your After Visit Summary has been reviewed and is understood.  Full responsibility of the confidentiality of this discharge information lies with you and/or your care-partner. 

## 2019-02-06 NOTE — Progress Notes (Signed)
Called to room to assist during endoscopic procedure.  Patient ID and intended procedure confirmed with present staff. Received instructions for my participation in the procedure from the performing physician.  

## 2019-02-06 NOTE — Op Note (Signed)
[place] Patient Name: Jeffrey Acosta Procedure Date: 02/06/2019 1:40 PM MRN: [id number] Endoscopist: Docia Chuck. Henrene Pastor , MD Age: 68 Referring MD:  Date of Birth: 20-Nov-1950 Gender: Male Account #: [id number] Procedure:                Upper GI endoscopy with biopsies Indications:              Surveillance for malignancy due to personal history                            of Barrett's esophagus Medicines:                Monitored Anesthesia Care Procedure:                Pre-Anesthesia Assessment:                           - Prior to the procedure, a History and Physical                            was performed, and patient medications and                            allergies were reviewed. The patient's tolerance of                            previous anesthesia was also reviewed. The risks                            and benefits of the procedure and the sedation                            options and risks were discussed with the patient.                            All questions were answered, and informed consent                            was obtained. Prior Anticoagulants: The patient has                            taken no previous anticoagulant or antiplatelet                            agents. ASA Grade Assessment: II - A patient with                            mild systemic disease. After reviewing the risks                            and benefits, the patient was deemed in                            satisfactory condition to undergo the procedure.  After obtaining informed consent, the endoscope was                            passed under direct vision. Throughout the                            procedure, the patient's blood pressure, pulse, and                            oxygen saturations were monitored continuously. The                            Endoscope was introduced through the mouth, and                            advanced to the  second part of duodenum. The upper                            GI endoscopy was accomplished without difficulty.                            The patient tolerated the procedure well. Scope In: Scope Out: Findings:                 There were esophageal mucosal changes secondary to                            established long-segment Barrett's disease present                            in the lower third of the esophagus. The maximum                            longitudinal extent of these mucosal changes was 6                            cm in length (C4,M6). This was examined under white                            light and NBI. No active inflammation, nodularity,                            or suspicious mucosa. The mucosa was biopsied with                            a cold forceps for histology. 4 biopsies obtained                            every 2 cm.                           The stomach was normal except for a 3 cm sliding  hiatal hernia.                           The examined duodenum was normal.                           The cardia and gastric fundus were normal on                            retroflexion. Complications:            No immediate complications. Estimated Blood Loss:     Estimated blood loss: none. Impression:               - Esophageal mucosal changes secondary to                            established long-segment Barrett's disease.                            Biopsied.                           - Normal stomach with hiatal hernia.                           - Normal examined duodenum. Recommendation:           - Patient has a contact number available for                            emergencies. The signs and symptoms of potential                            delayed complications were discussed with the                            patient. Return to normal activities tomorrow.                            Written discharge instructions were provided  to the                            patient.                           - Resume previous diet.                           - Continue present medications.                           - Follow-up biopsies                           - Repeat EGD with biopsies in 1 year if no dysplasia Rashidi Loh N. Henrene Pastor, MD 02/06/2019 2:03:20 PM This report has been signed electronically.

## 2019-02-06 NOTE — Progress Notes (Signed)
Temp JB V/s Arpin

## 2019-02-06 NOTE — Progress Notes (Signed)
Report to PACU, RN, vss, BBS= Clear.  

## 2019-02-08 ENCOUNTER — Telehealth: Payer: Self-pay

## 2019-02-08 NOTE — Telephone Encounter (Signed)
  Follow up Call-  Call back number 02/06/2019  Post procedure Call Back phone  # [phone]  Permission to leave phone message Yes  Some recent data might be hidden     Patient questions:  Do you have a fever, pain , or abdominal swelling? No. Pain Score  0 *  Have you tolerated food without any problems? Yes.    Have you been able to return to your normal activities? Yes.    Do you have any questions about your discharge instructions: Diet   No. Medications  No. Follow up visit  No.  Do you have questions or concerns about your Care? No.  Actions: * If pain score is 4 or above: No action needed, pain <4.   1. Have you developed a fever since your procedure? no  2.   Have you had an respiratory symptoms (SOB or cough) since your procedure? no  3.   Have you tested positive for COVID 19 since your procedure no  4.   Have you had any family members/close contacts diagnosed with the COVID 19 since your procedure?  no   If yes to any of these questions please route to Joylene John, RN and Alphonsa Gin, Therapist, sports.

## 2019-02-14 ENCOUNTER — Encounter: Payer: Self-pay | Admitting: Internal Medicine

## 2019-03-09 ENCOUNTER — Other Ambulatory Visit: Payer: Medicare Other

## 2019-03-09 ENCOUNTER — Ambulatory Visit: Payer: Medicare Other | Admitting: Hematology

## 2019-03-17 DIAGNOSIS — H2513 Age-related nuclear cataract, bilateral: Secondary | ICD-10-CM | POA: Diagnosis not present

## 2019-03-17 DIAGNOSIS — H25013 Cortical age-related cataract, bilateral: Secondary | ICD-10-CM | POA: Diagnosis not present

## 2019-03-17 DIAGNOSIS — H25043 Posterior subcapsular polar age-related cataract, bilateral: Secondary | ICD-10-CM | POA: Diagnosis not present

## 2019-03-17 DIAGNOSIS — H401131 Primary open-angle glaucoma, bilateral, mild stage: Secondary | ICD-10-CM | POA: Diagnosis not present

## 2019-03-21 ENCOUNTER — Other Ambulatory Visit: Payer: Self-pay

## 2019-03-21 ENCOUNTER — Inpatient Hospital Stay (HOSPITAL_BASED_OUTPATIENT_CLINIC_OR_DEPARTMENT_OTHER): Payer: Medicare Other | Admitting: Hematology

## 2019-03-21 ENCOUNTER — Inpatient Hospital Stay: Payer: Medicare Other | Attending: Hematology

## 2019-03-21 ENCOUNTER — Telehealth: Payer: Self-pay | Admitting: Hematology

## 2019-03-21 ENCOUNTER — Encounter: Payer: Self-pay | Admitting: Hematology

## 2019-03-21 VITALS — BP 142/91 | HR 75 | Temp 97.1°F | Resp 18 | Wt 208.0 lb

## 2019-03-21 DIAGNOSIS — E538 Deficiency of other specified B group vitamins: Secondary | ICD-10-CM | POA: Insufficient documentation

## 2019-03-21 DIAGNOSIS — D72819 Decreased white blood cell count, unspecified: Secondary | ICD-10-CM | POA: Diagnosis not present

## 2019-03-21 DIAGNOSIS — K760 Fatty (change of) liver, not elsewhere classified: Secondary | ICD-10-CM | POA: Insufficient documentation

## 2019-03-21 DIAGNOSIS — F1021 Alcohol dependence, in remission: Secondary | ICD-10-CM

## 2019-03-21 DIAGNOSIS — F1010 Alcohol abuse, uncomplicated: Secondary | ICD-10-CM | POA: Insufficient documentation

## 2019-03-21 DIAGNOSIS — D696 Thrombocytopenia, unspecified: Secondary | ICD-10-CM | POA: Diagnosis not present

## 2019-03-21 DIAGNOSIS — Z79899 Other long term (current) drug therapy: Secondary | ICD-10-CM | POA: Insufficient documentation

## 2019-03-21 DIAGNOSIS — K838 Other specified diseases of biliary tract: Secondary | ICD-10-CM | POA: Diagnosis not present

## 2019-03-21 DIAGNOSIS — K2270 Barrett's esophagus without dysplasia: Secondary | ICD-10-CM | POA: Diagnosis not present

## 2019-03-21 DIAGNOSIS — N189 Chronic kidney disease, unspecified: Secondary | ICD-10-CM | POA: Diagnosis not present

## 2019-03-21 DIAGNOSIS — D539 Nutritional anemia, unspecified: Secondary | ICD-10-CM

## 2019-03-21 LAB — CMP (CANCER CENTER ONLY)
ALT: 12 U/L (ref 0–44)
AST: 17 U/L (ref 15–41)
Albumin: 4.5 g/dL (ref 3.5–5.0)
Alkaline Phosphatase: 46 U/L (ref 38–126)
Anion gap: 5 (ref 5–15)
BUN: 16 mg/dL (ref 8–23)
CO2: 29 mmol/L (ref 22–32)
Calcium: 9.4 mg/dL (ref 8.9–10.3)
Chloride: 107 mmol/L (ref 98–111)
Creatinine: 1.28 mg/dL — ABNORMAL HIGH (ref 0.61–1.24)
GFR, Est AFR Am: >60 mL/min (ref >60)
GFR, Estimated: 56 mL/min — ABNORMAL LOW (ref >60)
Glucose, Bld: 110 mg/dL — ABNORMAL HIGH (ref 70–99)
Potassium: 4.3 mmol/L (ref 3.5–5.1)
Sodium: 141 mmol/L (ref 135–145)
Total Bilirubin: 0.8 mg/dL (ref 0.3–1.2)
Total Protein: 6.4 g/dL — ABNORMAL LOW (ref 6.5–8.1)

## 2019-03-21 LAB — CBC WITH DIFFERENTIAL (CANCER CENTER ONLY)
Abs Immature Granulocytes: 0.01 10*3/uL (ref 0.00–0.07)
Basophils Absolute: 0 10*3/uL (ref 0.0–0.1)
Basophils Relative: 1 %
Eosinophils Absolute: 0.3 10*3/uL (ref 0.0–0.5)
Eosinophils Relative: 5 %
HCT: 38.7 % — ABNORMAL LOW (ref 39.0–52.0)
Hemoglobin: 14 g/dL (ref 13.0–17.0)
Immature Granulocytes: 0 %
Lymphocytes Relative: 26 %
Lymphs Abs: 1.5 10*3/uL (ref 0.7–4.0)
MCH: 33 pg (ref 26.0–34.0)
MCHC: 36.2 g/dL — ABNORMAL HIGH (ref 30.0–36.0)
MCV: 91.3 fL (ref 80.0–100.0)
Monocytes Absolute: 0.5 10*3/uL (ref 0.1–1.0)
Monocytes Relative: 8 %
Neutro Abs: 3.5 10*3/uL (ref 1.7–7.7)
Neutrophils Relative %: 60 %
Platelet Count: 181 10*3/uL (ref 150–400)
RBC: 4.24 MIL/uL (ref 4.22–5.81)
RDW: 13.6 % (ref 11.5–15.5)
WBC Count: 5.8 10*3/uL (ref 4.0–10.5)
nRBC: 0 % (ref 0.0–0.2)

## 2019-03-21 LAB — LACTATE DEHYDROGENASE: LDH: 174 U/L (ref 98–192)

## 2019-03-21 LAB — SAVE SMEAR(SSMR), FOR PROVIDER SLIDE REVIEW

## 2019-03-21 NOTE — Progress Notes (Signed)
[place] OFFICE PROGRESS NOTE  Patient Care Team: Colon Branch, MD as PCP - General (Internal Medicine) Chucky May, MD as Consulting Physician (Psychiatry) Kathrynn Ducking, MD as Consulting Physician (Neurology) Tat, Eustace Quail, DO as Consulting Physician (Neurology) Marica Otter, Tatums as Consulting Physician (Optometry) Irene Shipper, MD as Consulting Physician (Gastroenterology) Demetrius Revel, DDS as Consulting Physician (Dentistry) Gavin Pound, MD as Consulting Physician (Rheumatology) Martinique, Amy, MD as Consulting Physician (Dermatology)  HEME/ONC OVERVIEW: 1. Intermittent anemia with macrocytosis -Likely due to mild B12 deficiency and EtOH use  -Nutritional and thyroid studies unremarkable; colonoscopy unremarkable in 2015  2. Mild intermittent thrombocytopenia and leukopenia  ASSESSMENT & PLAN:   Intermittent anemia with macrocytosis  -Most likely due to mild B12 deficiency and EtOH abuse -Currently on PO cyanocobalamin 1084mcg daily  -Hgb 14.0 with normal MCV, stable  -See discussion regarding EtOH use below  -Continue B12 supplement daily   Intermittent thrombocytopenia -Etiology as outlined above -Abdominal US showed hepatic steatosis without any evidence of cirrhosis -Plts 181k, stable  -See discussion regarding EtOH use below  Dilated common bile duct -MRCP in 08/2018 showed mild CBD dilation -EUS with sphincterotomy in 11/2018 showed no evidence of malignancy -Continue follow-up with gastroenterology  Barrett's esophagus -Noted on EGD in 11/2018 -Currently on Nexium 40mg  daily -I encouraged the patient to continue follow-up with PCP and GI   Stage II-III CKD -Baseline Cr 1.2-1.3 -Cr 1.28 today, stable; electrolytes normal -Continue follow-up with PCP   EtOH use  -Patient reports hx of heavy EtOH abuse with episodic relapses -He has been alcohol free since early 10/2018  -I congratulated the patient on the progress, and  encouraged him to be abstinent from EtOH use -As his cytopenias have resolved since EtOH cessation and B12 supplementation, he can continue follow-up with PCP   No orders of the defined types were placed in this encounter.  All questions were answered. The patient knows to call the clinic with any problems, questions or concerns. No barriers to learning was detected.  Return as needed.   Tish Men, MD 03/21/2019 10:13 AM  CHIEF COMPLAINT: "I am feeling good"  INTERVAL HISTORY: [name] returns to clinic for follow-up of history of macrocytic anemia and thrombocytopenia.  Patient reports that he has been alcohol free since 10/2018, and feels very well since stopping alcohol use.  His energy level is good.  He had two upper endoscopies over the past few months, which showed Barrett's esophagus, and his gastroenterologist had increased his Nexium from 20 mg to 40 mg daily.  Fortunately there was no evidence of malignancy from EUS.  He denies any other complaint today.  REVIEW OF SYSTEMS:   Constitutional: ( - ) fevers, ( - )  chills , ( - ) night sweats Eyes: ( - ) blurriness of vision, ( - ) double vision, ( - ) watery eyes Ears, nose, mouth, throat, and face: ( - ) mucositis, ( - ) sore throat Respiratory: ( - ) cough, ( - ) dyspnea, ( - ) wheezes Cardiovascular: ( - ) palpitation, ( - ) chest discomfort, ( - ) lower extremity swelling Gastrointestinal:  ( - ) nausea, ( - ) heartburn, ( - ) change in bowel habits Skin: ( - ) abnormal skin rashes Lymphatics: ( - ) new lymphadenopathy, ( - ) easy bruising Neurological: ( - ) numbness, ( - ) tingling, ( - ) new weaknesses Behavioral/Psych: ( - ) mood change, ( - ) new  changes  All other systems were reviewed with the patient and are negative.  SUMMARY OF ONCOLOGIC HISTORY: Oncology History   No history exists.    I have reviewed the past medical history, past surgical history, social history and family history with the patient and  they are unchanged from previous note.  ALLERGIES:  is allergic to omnipaque [iohexol].  MEDICATIONS:  Current Outpatient Medications  Medication Sig Dispense Refill  . allopurinol (ZYLOPRIM) 300 MG tablet Take 1 tablet (300 mg total) by mouth daily.    Marland Kitchen aspirin 81 MG tablet Take 81 mg by mouth daily.    Marland Kitchen atorvastatin (LIPITOR) 20 MG tablet Take 1 tablet (20 mg total) by mouth at bedtime. 90 tablet 3  . b complex vitamins capsule Take 1 capsule by mouth daily.    . Cholecalciferol (VITAMIN D) 50 MCG (2000 UT) tablet Take 2,000 Units by mouth daily.    Marland Kitchen esomeprazole (NEXIUM) 40 MG capsule Take 1 capsule (40 mg total) by mouth daily before breakfast. 60 capsule 3  . indomethacin (INDOCIN) 25 MG capsule Take 25 mg by mouth every 12 (twelve) hours as needed (back pain).   2  . lamoTRIgine (LAMICTAL) 25 MG tablet Take 25 mg by mouth daily.    Marland Kitchen levothyroxine (SYNTHROID, LEVOTHROID) 50 MCG tablet Take 1 tablet (50 mcg total) by mouth daily before breakfast. 90 tablet 1  . Multiple Vitamin (MULTIVITAMIN WITH MINERALS) TABS tablet Take 1 tablet by mouth daily.    . naltrexone (DEPADE) 50 MG tablet Take 50 mg by mouth daily.   12  . QUEtiapine (SEROQUEL) 100 MG tablet Take 400 mg by mouth at bedtime.    . tamsulosin (FLOMAX) 0.4 MG CAPS capsule Take 1 capsule (0.4 mg total) by mouth daily after supper. 30 capsule 3  . vitamin B-12 (CYANOCOBALAMIN) 500 MCG tablet Take 500 mcg by mouth daily.    Marland Kitchen zinc gluconate 50 MG tablet Take 50 mg by mouth daily.    . divalproex (DEPAKOTE ER) 500 MG 24 hr tablet Take by mouth daily.    Marland Kitchen lamoTRIgine (LAMICTAL) 100 MG tablet Take 100 mg by mouth daily.    . timolol (TIMOPTIC) 0.5 % ophthalmic solution 1 drop 2 (two) times daily.     No current facility-administered medications for this visit.     PHYSICAL EXAMINATION: ECOG PERFORMANCE STATUS: 0 - Asymptomatic  Today's Vitals   03/21/19 0940  BP: (!) 142/91  Pulse: 75  Resp: 18  Temp: (!) 97.1 F  (36.2 C)  TempSrc: Temporal  SpO2: 100%  Weight: 208 lb (94.3 kg)   Body mass index is 27.44 kg/m.  Filed Weights   03/21/19 0940  Weight: 208 lb (94.3 kg)    GENERAL: alert, no distress and comfortable, well appearing  SKIN: skin color, texture, turgor are normal, no rashes or significant lesions EYES: conjunctiva are pink and non-injected, sclera clear OROPHARYNX: no exudate, no erythema; lips, buccal mucosa, and tongue normal  NECK: supple, non-tender LUNGS: clear to auscultation with normal breathing effort HEART: regular rate & rhythm and no murmurs and no lower extremity edema ABDOMEN: soft, non-tender, non-distended, normal bowel sounds Musculoskeletal: no cyanosis of digits and no clubbing  PSYCH: alert & oriented x 3, fluent speech  LABORATORY DATA:  I have reviewed the data as listed    Component Value Date/Time   NA 141 03/21/2019 0929   NA 140 07/17/2015   K 4.3 03/21/2019 0929   CL 107 03/21/2019 0929  CO2 29 03/21/2019 0929   GLUCOSE 110 (H) 03/21/2019 0929   BUN 16 03/21/2019 0929   BUN 9 07/17/2015   CREATININE 1.28 (H) 03/21/2019 0929   CALCIUM 9.4 03/21/2019 0929   PROT 6.4 (L) 03/21/2019 0929   ALBUMIN 4.5 03/21/2019 0929   AST 17 03/21/2019 0929   ALT 12 03/21/2019 0929   ALKPHOS 46 03/21/2019 0929   BILITOT 0.8 03/21/2019 0929   GFRNONAA 56 (L) 03/21/2019 0929   GFRAA >60 03/21/2019 0929    No results found for: SPEP, UPEP  Lab Results  Component Value Date   WBC 5.8 03/21/2019   NEUTROABS 3.5 03/21/2019   HGB 14.0 03/21/2019   HCT 38.7 (L) 03/21/2019   MCV 91.3 03/21/2019   PLT 181 03/21/2019      Chemistry      Component Value Date/Time   NA 141 03/21/2019 0929   NA 140 07/17/2015   K 4.3 03/21/2019 0929   CL 107 03/21/2019 0929   CO2 29 03/21/2019 0929   BUN 16 03/21/2019 0929   BUN 9 07/17/2015   CREATININE 1.28 (H) 03/21/2019 0929   GLU 95 07/17/2015      Component Value Date/Time   CALCIUM 9.4 03/21/2019 0929    ALKPHOS 46 03/21/2019 0929   AST 17 03/21/2019 0929   ALT 12 03/21/2019 0929   BILITOT 0.8 03/21/2019 0929       RADIOGRAPHIC STUDIES: I have personally reviewed the radiological images as listed below and agreed with the findings in the report. No results found.

## 2019-03-21 NOTE — Telephone Encounter (Signed)
Return as needed per 11/24 los

## 2019-04-03 DIAGNOSIS — N5201 Erectile dysfunction due to arterial insufficiency: Secondary | ICD-10-CM | POA: Diagnosis not present

## 2019-04-03 DIAGNOSIS — R350 Frequency of micturition: Secondary | ICD-10-CM | POA: Diagnosis not present

## 2019-04-03 DIAGNOSIS — R3916 Straining to void: Secondary | ICD-10-CM | POA: Diagnosis not present

## 2019-04-10 ENCOUNTER — Other Ambulatory Visit: Payer: Self-pay

## 2019-04-11 ENCOUNTER — Ambulatory Visit: Payer: Medicare Other | Admitting: Internal Medicine

## 2019-04-11 ENCOUNTER — Other Ambulatory Visit: Payer: Self-pay

## 2019-04-11 ENCOUNTER — Encounter: Payer: Self-pay | Admitting: Internal Medicine

## 2019-04-11 ENCOUNTER — Other Ambulatory Visit: Payer: Self-pay | Admitting: Internal Medicine

## 2019-04-11 ENCOUNTER — Ambulatory Visit (INDEPENDENT_AMBULATORY_CARE_PROVIDER_SITE_OTHER): Payer: Medicare Other | Admitting: Internal Medicine

## 2019-04-11 VITALS — BP 132/78 | HR 130 | Temp 96.2°F | Resp 16 | Ht 73.0 in | Wt 212.0 lb

## 2019-04-11 DIAGNOSIS — F1021 Alcohol dependence, in remission: Secondary | ICD-10-CM

## 2019-04-11 DIAGNOSIS — E538 Deficiency of other specified B group vitamins: Secondary | ICD-10-CM | POA: Diagnosis not present

## 2019-04-11 DIAGNOSIS — I471 Supraventricular tachycardia: Secondary | ICD-10-CM | POA: Diagnosis not present

## 2019-04-11 DIAGNOSIS — E039 Hypothyroidism, unspecified: Secondary | ICD-10-CM | POA: Diagnosis not present

## 2019-04-11 DIAGNOSIS — E7800 Pure hypercholesterolemia, unspecified: Secondary | ICD-10-CM | POA: Diagnosis not present

## 2019-04-11 DIAGNOSIS — R000 Tachycardia, unspecified: Secondary | ICD-10-CM | POA: Diagnosis not present

## 2019-04-11 LAB — LIPID PANEL
Cholesterol: 119 mg/dL (ref 0–200)
HDL: 49.7 mg/dL (ref >39.00)
LDL Cholesterol: 54 mg/dL (ref 0–99)
NonHDL: 68.95
Total CHOL/HDL Ratio: 2
Triglycerides: 75 mg/dL (ref 0.0–149.0)
VLDL: 15 mg/dL (ref 0.0–40.0)

## 2019-04-11 LAB — TSH: TSH: 1.24 u[IU]/mL (ref 0.35–4.50)

## 2019-04-11 LAB — MAGNESIUM: Magnesium: 1.6 mg/dL (ref 1.5–2.5)

## 2019-04-11 LAB — VITAMIN B12: Vitamin B-12: 850 pg/mL (ref 211–911)

## 2019-04-11 MED ORDER — METOPROLOL TARTRATE 25 MG PO TABS: 25.0000 mg | ORAL_TABLET | Freq: Two times a day (BID) | ORAL | 1 refills | Status: DC

## 2019-04-11 NOTE — Patient Instructions (Addendum)
GO TO THE LAB : Get the blood work     GO TO THE FRONT DESK Schedule your next appointment   for a checkup in 2 months  We are referring you to cardiology  If the dizziness increases or you have any symptoms along with it: Please call immediately  Start metoprolol, check your blood pressure to 3 times a week to be sure is not going low.

## 2019-04-11 NOTE — Progress Notes (Signed)
Subjective:    Patient ID: Jeffrey Acosta, male    DOB: November 07, 1950, 68 y.o.   MRN: [id number]  DOS:  04/11/2019 Type of visit - description: Routine visit GI work-up reviewed Hematology note reviewed The patient was noted to have on and off tachycardia.  EKG done.  Review of Systems  Denies fever chills No chest pain no difficulty breathing No lower extremity edema or palpitations Reports occasional dizziness, it last from 5 to 30 seconds, it happens when he is standing, bending or even at rest. No LOC, no associated nausea or sweats. Denies any stroke symptoms such as slurred speech, motor deficits, severe headaches.   Past Medical History:  Diagnosis Date  . Alcoholism in remission ([place])    in [place]  . Bipolar affective disorder ([place])   . Diverticulosis of colon (without mention of hemorrhage)   . GERD (gastroesophageal reflux disease)   . Gout of multiple sites   . Hiatal hernia   . Hx of migraines   . Hydrocele   . HYPERLIPIDEMIA 01/10/2009   Qualifier: Diagnosis of  By: Ronnald Ramp CMA, Chemira    NMR Lipoprofile 2011 LDL could not be calculated  Due to TG of 889  (811  / 750 ),  HDL 50 . Father MI @ 54 PGM CVA early 45s; PGF CVA @72    . Hypothyroidism   . Melanoma of skin, site unspecified 11/25/2012   07/2011 abdominal  Stage 1 Dr Amy Martinique Dr Sarajane Jews   . Osteopenia   . Polyarthralgia     Past Surgical History:  Procedure Laterality Date  . BILIARY BRUSHING  12/07/2018   Procedure: BILIARY BRUSHING;  Surgeon: Rush Landmark Telford Nab., MD;  Location: [facility];  Service: Gastroenterology;;  . BILIARY DILATION  12/07/2018   Procedure: BILIARY DILATION;  Surgeon: Irving Copas., MD;  Location: [facility];  Service: Gastroenterology;;  . BIOPSY  12/07/2018   Procedure: BIOPSY;  Surgeon: Irving Copas., MD;  Location: [facility];  Service: Gastroenterology;;  . COLONOSCOPY  2003   negative ; Dr Sharlett Iles  . ERCP N/A 12/07/2018   Procedure: ENDOSCOPIC  RETROGRADE CHOLANGIOPANCREATOGRAPHY (ERCP);  Surgeon: Irving Copas., MD;  Location: [facility];  Service: Gastroenterology;  Laterality: N/A;  . ESOPHAGOGASTRODUODENOSCOPY (EGD) WITH PROPOFOL N/A 12/07/2018   Procedure: ESOPHAGOGASTRODUODENOSCOPY (EGD) WITH PROPOFOL;  Surgeon: Rush Landmark Telford Nab., MD;  Location: [facility];  Service: Gastroenterology;  Laterality: N/A;  . EUS N/A 12/07/2018   Procedure: UPPER ENDOSCOPIC ULTRASOUND (EUS) RADIAL;  Surgeon: Rush Landmark Telford Nab., MD;  Location: [facility];  Service: Gastroenterology;  Laterality: N/A;  . INGUINAL HERNIA REPAIR  08/07/2011   Procedure: HERNIA REPAIR INGUINAL ADULT;  Surgeon: Rolm Bookbinder, MD;  Location: [facility];  Service: General;  Laterality: Right;  . MELANOMA EXCISION  07/28/11   Dr Sarajane Jews; stomach and chest  . REMOVAL OF STONES  12/07/2018   Procedure: REMOVAL OF STONES;  Surgeon: Rush Landmark Telford Nab., MD;  Location: [facility];  Service: Gastroenterology;;  . [name]  1951  . 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 UPPER GASTROINTESTINAL ENDOSCOPY  1983   hiatal hernia  . VASECTOMY  1979    Social History   Socioeconomic History  . Marital status: Married    Spouse name: [name]  . Number of children: 1  . Years of education: Not on file  . Highest education level: Not on file  Occupational History  . Occupation: retired  Tobacco Use  . Smoking  status: Never Smoker  . Smokeless tobacco: Never Used  Substance and Sexual Activity  . Alcohol use: Yes    Alcohol/week: 0.0 standard drinks    Comment: pt reports periodic alcohol  . Drug use: No  . Sexual activity: Yes    Partners: Female    Comment: Having some issues with ED.  Would like provider's recommendation.    Other Topics Concern  . Not on file  Social History Narrative   1 child, 2 step children   Household- pt, wife    Leo Rod Jeff (sister)   Social  Determinants of Health   Financial Resource Strain:   . Difficulty of Paying Living Expenses: Not on file  Food Insecurity:   . Worried About Charity fundraiser in the Last Year: Not on file  . Ran Out of Food in the Last Year: Not on file  Transportation Needs:   . Lack of Transportation (Medical): Not on file  . Lack of Transportation (Non-Medical): Not on file  Physical Activity:   . Days of Exercise per Week: Not on file  . Minutes of Exercise per Session: Not on file  Stress:   . Feeling of Stress : Not on file  Social Connections:   . Frequency of Communication with Friends and Family: Not on file  . Frequency of Social Gatherings with Friends and Family: Not on file  . Attends Religious Services: Not on file  . Active Member of Clubs or Organizations: Not on file  . Attends Archivist Meetings: Not on file  . Marital Status: Not on file  Intimate Partner Violence:   . Fear of Current or Ex-Partner: Not on file  . Emotionally Abused: Not on file  . Physically Abused: Not on file  . Sexually Abused: Not on file      Allergies as of 04/11/2019      Reactions   Omnipaque [iohexol] Hives    Desc: developed hives after injection; resolved with 50 mg benadryl given to pt.      Medication List       Accurate as of April 11, 2019  5:30 PM. If you have any questions, ask your nurse or doctor.        STOP taking these medications   divalproex 500 MG 24 hr tablet Commonly known as: DEPAKOTE ER Stopped by: Kathlene November, MD     TAKE these medications   allopurinol 300 MG tablet Commonly known as: ZYLOPRIM Take 1 tablet (300 mg total) by mouth daily.   aspirin 81 MG tablet Take 81 mg by mouth daily.   atorvastatin 20 MG tablet Commonly known as: LIPITOR Take 1 tablet (20 mg total) by mouth at bedtime.   b complex vitamins capsule Take 1 capsule by mouth daily.   esomeprazole 40 MG capsule Commonly known as: NEXIUM Take 1 capsule (40 mg total) by  mouth daily before breakfast.   indomethacin 25 MG capsule Commonly known as: INDOCIN Take 25 mg by mouth every 12 (twelve) hours as needed (back pain).   lamoTRIgine 25 MG tablet Commonly known as: LAMICTAL Take 25 mg by mouth daily.   lamoTRIgine 100 MG tablet Commonly known as: LAMICTAL Take 100 mg by mouth daily.   levothyroxine 50 MCG tablet Commonly known as: SYNTHROID Take 1 tablet (50 mcg total) by mouth daily before breakfast.   metoprolol tartrate 25 MG tablet Commonly known as: LOPRESSOR Take 1 tablet (25 mg total) by  mouth 2 (two) times daily. Started by: Kathlene November, MD   multivitamin with minerals Tabs tablet Take 1 tablet by mouth daily.   naltrexone 50 MG tablet Commonly known as: DEPADE Take 50 mg by mouth daily.   QUEtiapine 100 MG tablet Commonly known as: SEROQUEL Take 400 mg by mouth at bedtime.   tamsulosin 0.4 MG Caps capsule Commonly known as: FLOMAX Take 1 capsule (0.4 mg total) by mouth daily after supper.   timolol 0.5 % ophthalmic solution Commonly known as: TIMOPTIC 1 drop 2 (two) times daily.   vitamin B-12 500 MCG tablet Commonly known as: CYANOCOBALAMIN Take 500 mcg by mouth daily.   Vitamin D 50 MCG (2000 UT) tablet Take 2,000 Units by mouth daily.   zinc gluconate 50 MG tablet Take 50 mg by mouth daily.           Objective:   Physical Exam BP 132/78 (BP Location: Left Arm, Patient Position: Sitting, Cuff Size: Normal)   Pulse (!) 130   Temp (!) 96.2 F (35.7 C) (Temporal)   Resp 16   Ht 6\' 1"  (1.854 m)   Wt 212 lb (96.2 kg)   SpO2 98%   BMI 27.97 kg/m   General:   Well developed, NAD, BMI noted.  HEENT:  Normocephalic . Face symmetric, atraumatic Neck: Normal carotid pulses bilaterally Lungs:  CTA B Normal respiratory effort, no intercostal retractions, no accessory muscle use. Heart: Regular with episodes of tachycardia,  no murmur.  no pretibial edema bilaterally  Abdomen:  Not distended, soft,  non-tender. No rebound or rigidity.   Skin: Not pale. Not jaundice Neurologic:  alert & oriented X3.  Speech normal, gait appropriate for age and unassisted Psych--  Cognition and judgment appear intact.  Cooperative with normal attention span and concentration.  Behavior appropriate. No anxious or depressed appearing.     Assessment     Assessment  Bipolar disorder-- Dr Toy Care, dc lithium 07-2014 EtOH abuse-- remission w/ occ relapse Hypothyroidism Hyperlipidemia E.D.  MSK: ---Back pain: Saw Dr. Ramos/Geoffrey~ 2002, 3 local injections, offered surgery.  ---Gout-- Dr Berna Bue, released to PCP 2018 Melanoma 2014 Dr. Martinique Osteopenia  T score -2.4  ( 11-2012) Vit d def dx 02-2015 GI:  -GERD with Barrett's per EGD 11-2018 - History of diverticulitis -11-2018: Mild to moderate biliary dilation without definitive cause found status post EUS and ERCP with sphincterotomy and negative brushings LUTS   PLAN EtOH: Sober since June 29, attends AA meetings Hypothyroidism: Checking a TSH B12 deficiency: On supplements, checking levels High cholesterol: Continue atorvastatin, last LFTs normal, check a FLP GERD, Barrett's: Since the last office visit had EGD, EUS, ERCP, dx:  -Long segment Barrett's esophagus.  No dysplasia on biopsies August 2020 - Mild to moderate biliary dilation without definitive cause found status post EUS and ERCP with sphincterotomy and negative brushings. Is recommended to continue PPIs.  Patient wanted to hold off cholecystectomy Hematology note: Last visit 03/21/2019: Recommended to continue with B12 supplements and EtOH abstinence, RTC prn Atrial tachycardia: Has episodes of tachycardia, EKG showing paroxysmal atrial tachycardia, discussed with cardiology, not a dangerous rhythm, he is suggested beta-blockers and possibly cardiology referral. Plan: Start low-dose metoprolol, watch BPs, refer to cardiology. Dizziness: As described above, unclear  if related to  tachycardia (doubtful).  Reassess on RTC RTC 2 months    This visit occurred during the SARS-CoV-2 public health emergency.  Safety protocols were in place, including screening questions prior to the visit, additional usage of  staff PPE, and extensive cleaning of exam room while observing appropriate contact time as indicated for disinfecting solutions.

## 2019-04-11 NOTE — Progress Notes (Signed)
Pre visit review using our clinic review tool, if applicable. No additional management support is needed unless otherwise documented below in the visit note. 

## 2019-04-11 NOTE — Assessment & Plan Note (Signed)
EtOH: Sober since June 29, attends AA meetings Hypothyroidism: Checking a TSH B12 deficiency: On supplements, checking levels High cholesterol: Continue atorvastatin, last LFTs normal, check a FLP GERD, Barrett's: Since the last office visit had EGD, EUS, ERCP, dx:  -Long segment Barrett's esophagus.  No dysplasia on biopsies August 2020 - Mild to moderate biliary dilation without definitive cause found status post EUS and ERCP with sphincterotomy and negative brushings. Is recommended to continue PPIs.  Patient wanted to hold off cholecystectomy Hematology note: Last visit 03/21/2019: Recommended to continue with B12 supplements and EtOH abstinence, RTC prn Atrial tachycardia: Has episodes of tachycardia, EKG showing paroxysmal atrial tachycardia, discussed with cardiology, not a dangerous rhythm, he is suggested beta-blockers and possibly cardiology referral. Plan: Start low-dose metoprolol, watch BPs, refer to cardiology. Dizziness: As described above, unclear  if related to tachycardia (doubtful).  Reassess on RTC RTC 2 months

## 2019-04-12 ENCOUNTER — Encounter: Payer: Self-pay | Admitting: Cardiovascular Disease

## 2019-04-12 ENCOUNTER — Telehealth: Payer: Self-pay | Admitting: Radiology

## 2019-04-12 ENCOUNTER — Telehealth: Payer: Self-pay

## 2019-04-12 ENCOUNTER — Telehealth (INDEPENDENT_AMBULATORY_CARE_PROVIDER_SITE_OTHER): Payer: Medicare Other | Admitting: Cardiovascular Disease

## 2019-04-12 VITALS — Ht 73.0 in | Wt 212.0 lb

## 2019-04-12 DIAGNOSIS — I471 Supraventricular tachycardia: Secondary | ICD-10-CM

## 2019-04-12 DIAGNOSIS — E782 Mixed hyperlipidemia: Secondary | ICD-10-CM

## 2019-04-12 DIAGNOSIS — R42 Dizziness and giddiness: Secondary | ICD-10-CM | POA: Diagnosis not present

## 2019-04-12 NOTE — Telephone Encounter (Signed)
Enrolled the patient for a 7 day Zio monitor to be mailed to patients home

## 2019-04-12 NOTE — Telephone Encounter (Signed)

## 2019-04-12 NOTE — Progress Notes (Signed)
Virtual Visit via Phone Note   This visit type was conducted due to national recommendations for restrictions regarding the COVID-19 Pandemic (e.g. social distancing) in an effort to limit this patient's exposure and mitigate transmission in our community.  Due to his co-morbid illnesses, this patient is at least at moderate risk for complications without adequate follow up.  This format is felt to be most appropriate for this patient at this time.  All issues noted in this document were discussed and addressed.  A limited physical exam was performed with this format.  Please refer to the patient's chart for his consent to telehealth for [facility].   Date:  04/13/2019   ID:  Jeffrey Acosta, DOB 12-22-1950, MRN [id number]  Patient Location: Home Provider Location: Home  PCP:  Colon Branch, MD  Cardiologist:  Evalina Field, MD   Evaluation Performed:  New Patient Evaluation  Chief Complaint:  Atrial tachycardia   History of Present Illness:    Jeffrey Acosta is a 68 y.o. male with GERD, etoh abuse, HLD who presents for evaluation for atrial tachycardia at the request of Colon Branch, MD. He was evaluated by his PCP yesterday and EKG shows NSR with a run of atrial tachycardia.   He reports being told about atrial tachycardia in October. During EGD he had rapid heart rates in the 130s. This was a limited experience and no further episodes. He reports he has had no prolonged episodes of tachycardia, palpitations, SOB, or CP. He has significant work-up for anemia. EGD simply shows Barrett's esophagus. He is a recovering alcoholic. Clean since 2008. No history of MI/CVA. Never smoker. No structure exercise. No limitations with daily activities.   He is getting dizzy spells. Last seconds to minutes. Associated with light-headedness. No syncope. Started 3-4 months ago. Gets them 3-4 times per week. No CP/SOB/nausea with the episodes. Can occur with bending over, or climbing stairs. Not  consistent. Can also occur while standing. TSH normal at PCP office.   The patient does not have symptoms concerning for COVID-19 infection (fever, chills, cough, or new shortness of breath).    Past Medical History:  Diagnosis Date  . Alcoholism in remission ([place])    in [place]  . Bipolar affective disorder ([place])   . Diverticulosis of colon (without mention of hemorrhage)   . GERD (gastroesophageal reflux disease)   . Gout of multiple sites   . Hiatal hernia   . Hx of migraines   . Hydrocele   . HYPERLIPIDEMIA 01/10/2009   Qualifier: Diagnosis of  By: Ronnald Ramp CMA, Chemira    NMR Lipoprofile 2011 LDL could not be calculated  Due to TG of 889  (811  / 750 ),  HDL 50 . Father MI @ 64 PGM CVA early 46s; PGF CVA @72    . Hypothyroidism   . Melanoma of skin, site unspecified 11/25/2012   07/2011 abdominal  Stage 1 Dr Amy Martinique Dr Sarajane Jews   . Osteopenia   . Polyarthralgia    Past Surgical History:  Procedure Laterality Date  . BILIARY BRUSHING  12/07/2018   Procedure: BILIARY BRUSHING;  Surgeon: Rush Landmark Telford Nab., MD;  Location: [facility];  Service: Gastroenterology;;  . BILIARY DILATION  12/07/2018   Procedure: BILIARY DILATION;  Surgeon: Irving Copas., MD;  Location: [facility];  Service: Gastroenterology;;  . BIOPSY  12/07/2018   Procedure: BIOPSY;  Surgeon: Irving Copas., MD;  Location: [facility];  Service: Gastroenterology;;  . COLONOSCOPY  2003   negative ; Dr Sharlett Iles  . ERCP N/A 12/07/2018   Procedure: ENDOSCOPIC RETROGRADE CHOLANGIOPANCREATOGRAPHY (ERCP);  Surgeon: Irving Copas., MD;  Location: [facility];  Service: Gastroenterology;  Laterality: N/A;  . ESOPHAGOGASTRODUODENOSCOPY (EGD) WITH PROPOFOL N/A 12/07/2018   Procedure: ESOPHAGOGASTRODUODENOSCOPY (EGD) WITH PROPOFOL;  Surgeon: Rush Landmark Telford Nab., MD;  Location: [facility];  Service: Gastroenterology;  Laterality: N/A;  . EUS N/A 12/07/2018   Procedure: UPPER ENDOSCOPIC ULTRASOUND  (EUS) RADIAL;  Surgeon: Rush Landmark Telford Nab., MD;  Location: [facility];  Service: Gastroenterology;  Laterality: N/A;  . INGUINAL HERNIA REPAIR  08/07/2011   Procedure: HERNIA REPAIR INGUINAL ADULT;  Surgeon: Rolm Bookbinder, MD;  Location: [facility];  Service: General;  Laterality: Right;  . MELANOMA EXCISION  07/28/11   Dr Sarajane Jews; stomach and chest  . REMOVAL OF STONES  12/07/2018   Procedure: REMOVAL OF STONES;  Surgeon: Rush Landmark Telford Nab., MD;  Location: [facility];  Service: Gastroenterology;;  . [name]  1951  . 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 UPPER GASTROINTESTINAL ENDOSCOPY  1983   hiatal hernia  . VASECTOMY  1979     Current Meds  Medication Sig  . allopurinol (ZYLOPRIM) 300 MG tablet Take 1 tablet (300 mg total) by mouth daily.  Marland Kitchen aspirin 81 MG tablet Take 81 mg by mouth daily.  Marland Kitchen atorvastatin (LIPITOR) 20 MG tablet Take 1 tablet (20 mg total) by mouth at bedtime.  Marland Kitchen b complex vitamins capsule Take 1 capsule by mouth daily.  . Cholecalciferol (VITAMIN D) 50 MCG (2000 UT) tablet Take 2,000 Units by mouth daily.  Marland Kitchen esomeprazole (NEXIUM) 40 MG capsule Take 1 capsule (40 mg total) by mouth daily before breakfast.  . indomethacin (INDOCIN) 25 MG capsule Take 25 mg by mouth every 12 (twelve) hours as needed (back pain).   Marland Kitchen lamoTRIgine (LAMICTAL) 100 MG tablet Take 100 mg by mouth daily.  Marland Kitchen levothyroxine (SYNTHROID, LEVOTHROID) 50 MCG tablet Take 1 tablet (50 mcg total) by mouth daily before breakfast.  . metoprolol tartrate (LOPRESSOR) 25 MG tablet Take 1 tablet (25 mg total) by mouth 2 (two) times daily.  . Multiple Vitamin (MULTIVITAMIN WITH MINERALS) TABS tablet Take 1 tablet by mouth daily.  . naltrexone (DEPADE) 50 MG tablet Take 50 mg by mouth daily.   . QUEtiapine (SEROQUEL) 400 MG tablet Take 800 mg by mouth at bedtime.   . tamsulosin (FLOMAX) 0.4 MG CAPS capsule Take 1 capsule (0.4 mg total) by mouth daily after supper.    . timolol (TIMOPTIC) 0.5 % ophthalmic solution 1 drop 2 (two) times daily.  . vitamin B-12 (CYANOCOBALAMIN) 500 MCG tablet Take 500 mcg by mouth daily.  Marland Kitchen zinc gluconate 50 MG tablet Take 50 mg by mouth daily.     Allergies:   Omnipaque [iohexol]   Social History   Tobacco Use  . Smoking status: Never Smoker  . Smokeless tobacco: Never Used  Substance Use Topics  . Alcohol use: Not Currently    Alcohol/week: 0.0 standard drinks    Comment: pt reports periodic alcohol  . Drug use: No     Family Hx: The patient's family history includes COPD in his father and mother; Diabetes in his paternal grandmother; Heart attack (age of onset: 11) in his father; Lung cancer in his paternal grandfather; Parkinsonism in his paternal grandmother; Stroke (age of onset: 26) in his paternal grandmother; Stroke (age of onset: 73) in his maternal grandfather. There is no history  of Colon cancer or Prostate cancer.  ROS:   Please see the history of present illness.     All other systems reviewed and are negative.  Prior CV studies:   The following studies were reviewed today:  Echo 2016: - Left ventricle: The cavity size was normal. Wall thickness was   normal. The estimated ejection fraction was 60%. Wall motion was   normal; there were no regional wall motion abnormalities. - Left atrium: The atrium was moderately dilated. - Right ventricle: The cavity size was normal. Systolic function   was normal. - Right atrium: The atrium was mildly dilated.  Labs/Other Tests and Data Reviewed:    EKG:  An ECG dated 04/11/2019 was personally reviewed today and demonstrated:  NSR, 1AVB, LAFB, brief 7 beat run of EAT   Recent Labs: 03/21/2019: ALT 12; BUN 16; Creatinine 1.28; Hemoglobin 14.0; Platelet Count 181; Potassium 4.3; Sodium 141 04/11/2019: Magnesium 1.6; TSH 1.24   Recent Lipid Panel Lab Results  Component Value Date/Time   CHOL 119 04/11/2019 12:06 PM   TRIG 75.0 04/11/2019 12:06 PM    HDL 49.70 04/11/2019 12:06 PM   CHOLHDL 2 04/11/2019 12:06 PM   LDLCALC 54 04/11/2019 12:06 PM   LDLDIRECT 79.0 05/10/2018 07:58 AM    Wt Readings from Last 3 Encounters:  04/12/19 212 lb (96.2 kg)  04/11/19 212 lb (96.2 kg)  03/21/19 208 lb (94.3 kg)    Objective:    Vital Signs:  Ht 6\' 1"  (1.854 m)   Wt 212 lb (96.2 kg)   BMI 27.97 kg/m    VITAL SIGNS:  reviewed   Unable to do physical exam.   ASSESSMENT & PLAN:    1. Atrial Tachycardia/Dizziness: 3-4 months of dizzy spells. EKG yesterday with clear atrial tachycardia. Started on metoprolol and we will continue this. Needs updated TTE. TSH normal. Will also get a 7-day zio patch to determine a tach burden as well as if this explains his symptoms. Will see him in office in 3 months after above work-up. Sooner if alarming findings on work-up.  2. HLD: Continue statin.   COVID-19 Education: The signs and symptoms of COVID-19 were discussed with the patient and how to seek care for testing (follow up with PCP or arrange E-visit).  The importance of social distancing was discussed today.  Time:   Today, I have spent 35 minutes with the patient with telehealth technology discussing the above problems.     Medication Adjustments/Labs and Tests Ordered: Current medicines are reviewed at length with the patient today.  Concerns regarding medicines are outlined above.   Tests Ordered: Orders Placed This Encounter  Procedures  . LONG TERM MONITOR (3-14 DAYS)  . ECHOCARDIOGRAM COMPLETE    Medication Changes: No orders of the defined types were placed in this encounter.   Follow Up:  In Person in 3 month(s) with EKG at office visit   Signed, Evalina Field, MD  04/13/2019 6:38 AM    [place]

## 2019-04-12 NOTE — Patient Instructions (Signed)
Medication Instructions:  Your physician recommends that you continue on your current medications as directed. Please refer to the Current Medication list given to you today.  *If you need a refill on your cardiac medications before your next appointment, please call your pharmacy*  Lab Work: NONE ordered at this time of appointment   If you have labs (blood work) drawn today and your tests are completely normal, you will receive your results only by: Marland Kitchen MyChart Message (if you have MyChart) OR . A paper copy in the mail If you have any lab test that is abnormal or we need to change your treatment, we will call you to review the results.  Testing/Procedures: Your physician has requested that you have an echocardiogram. Echocardiography is a painless test that uses sound waves to create images of your heart. It provides your doctor with information about the size and shape of your heart and how well your heart's chambers and valves are working. This procedure takes approximately one hour. There are no restrictions for this procedure. THIS IS DONE AT [address]  PLEASE SCHEDULE FOR 2-3 WEEKS  ZIO XT- Long Term Monitor Instructions  This will be mailed to you, please expect 7-10 days to receive.       Your physician has requested you wear your ZIO patch monitor 7 days.   This is a single patch monitor.  [employer] supplies one patch monitor per enrollment.  Additional stickers are not available.   Please do not apply patch if you will be having a Nuclear Stress Test, Echocardiogram, Cardiac CT, MRI, or Chest Xray during the time frame you would be wearing the monitor. The patch cannot be worn during these tests.  You cannot remove and re-apply the ZIO XT patch monitor.   Your ZIO patch monitor will be sent USPS Priority mail from [facility] directly to your home address. The monitor may also be mailed to a PO BOX if home delivery is not available.   It may take 3-5 days  to receive your monitor after you have been enrolled.   Once you have received you monitor, please review enclosed instructions.  Your monitor has already been registered assigning a specific monitor serial # to you.   Applying the monitor   Shave hair from upper left chest.   Hold abrader disc by orange tab.  Rub abrader in 40 strokes over left upper chest as indicated in your monitor instructions.   Clean area with 4 enclosed alcohol pads .  Use all pads to assure are is cleaned thoroughly.  Let dry.   Apply patch as indicated in monitor instructions.  Patch will be place under collarbone on left side of chest with arrow pointing upward.   Rub patch adhesive wings for 2 minutes.Remove white label marked "1".  Remove white label marked "2".  Rub patch adhesive wings for 2 additional minutes.   While looking in a mirror, press and release button in center of patch.  A small green light will flash 3-4 times .  This will be your only indicator the monitor has been turned on.     Do not shower for the first 24 hours.  You may shower after the first 24 hours.   Press button if you feel a symptom. You will hear a small click.  Record Date, Time and Symptom in the Patient Log Book.   When you are ready to remove patch, follow instructions on last 2 pages of  Patient Log Book.  Stick patch monitor onto last page of Patient Log Book.   Place Patient Log Book in [place] box.  Use locking tab on box and tape box closed securely.  The [employer] and [employer] has [employer] on it.  Please place in mailbox as soon as possible.  Your physician should have your test results approximately 7 days after the monitor has been mailed back to [facility].   Call [place] at [phone] if you have questions regarding your ZIO XT patch monitor.  Call them immediately if you see an orange light blinking on your monitor.   If your monitor falls off in less than 4 days contact  our Monitor department at [phone].  If your monitor becomes loose or falls off after 4 days call [employer] at [phone] for suggestions on securing your monitor.   Follow-Up: At [facility], you and your health needs are our priority.  As part of our continuing mission to provide you with exceptional heart care, we have created designated Provider Care Teams.  These Care Teams include your primary Cardiologist (physician) and Advanced Practice Providers (APPs -  Physician Assistants and Nurse Practitioners) who all work together to provide you with the care you need, when you need it.  Your next appointment:   3 month(s)  The format for your next appointment:   In Person  Provider:   Eleonore Chiquito, MD  Other Instructions

## 2019-04-18 NOTE — Telephone Encounter (Signed)
Returned patients call and advised him to wait until after his echo to apply the monitor

## 2019-04-18 NOTE — Telephone Encounter (Signed)
Patient is calling wanting to know if he should wait to put on his monitor until after his Echo scheduled for 04/19/19.

## 2019-04-19 ENCOUNTER — Ambulatory Visit (HOSPITAL_COMMUNITY): Payer: Medicare Other | Attending: Cardiology

## 2019-04-19 ENCOUNTER — Other Ambulatory Visit: Payer: Self-pay

## 2019-04-19 DIAGNOSIS — R42 Dizziness and giddiness: Secondary | ICD-10-CM

## 2019-04-19 DIAGNOSIS — I471 Supraventricular tachycardia: Secondary | ICD-10-CM

## 2019-04-21 ENCOUNTER — Other Ambulatory Visit (INDEPENDENT_AMBULATORY_CARE_PROVIDER_SITE_OTHER): Payer: Medicare Other

## 2019-04-21 DIAGNOSIS — I471 Supraventricular tachycardia: Secondary | ICD-10-CM | POA: Diagnosis not present

## 2019-04-21 DIAGNOSIS — R42 Dizziness and giddiness: Secondary | ICD-10-CM | POA: Diagnosis not present

## 2019-05-08 DIAGNOSIS — R42 Dizziness and giddiness: Secondary | ICD-10-CM | POA: Diagnosis not present

## 2019-05-10 ENCOUNTER — Other Ambulatory Visit: Payer: Self-pay | Admitting: Internal Medicine

## 2019-05-11 ENCOUNTER — Telehealth: Payer: Self-pay | Admitting: Cardiovascular Disease

## 2019-05-11 MED ORDER — METOPROLOL SUCCINATE ER 100 MG PO TB24: 100.0000 mg | ORAL_TABLET | Freq: Every day | ORAL | 3 refills | Status: DC

## 2019-05-11 NOTE — Telephone Encounter (Signed)
Notified Jeffrey Acosta of results. He has an atrial tachycardia. Better on metoprolol tartrate. Will switch to 100 mg metoprolol succinate. We will keep appointment for March. If he has symptoms that continue he will see [place] sooner.   [place] T. Audie Box, [place]  [address], [place], [place] [address] [phone]  9:14 AM

## 2019-05-25 ENCOUNTER — Ambulatory Visit: Payer: Medicare Other

## 2019-05-29 DIAGNOSIS — Z0120 Encounter for dental examination and cleaning without abnormal findings: Secondary | ICD-10-CM | POA: Diagnosis not present

## 2019-06-03 ENCOUNTER — Ambulatory Visit: Payer: Medicare Other | Attending: Internal Medicine

## 2019-06-03 DIAGNOSIS — Z23 Encounter for immunization: Secondary | ICD-10-CM | POA: Insufficient documentation

## 2019-06-03 NOTE — Progress Notes (Signed)
   Covid-19 Vaccination Clinic  Name:  Jeffrey Acosta    MRN: [id number] DOB: Sep 16, 1950  06/03/2019  [name] was observed post Covid-19 immunization for 15 minutes without incidence. He was provided with Vaccine Information Sheet and instruction to access the V-Safe system.   [name] was instructed to call 911 with any severe reactions post vaccine: Marland Kitchen Difficulty breathing  . Swelling of your face and throat  . A fast heartbeat  . A bad rash all over your body  . Dizziness and weakness    Immunizations Administered    Name Date Dose VIS Date Route   Pfizer COVID-19 Vaccine 06/03/2019  8:33 AM 0.3 mL 04/07/2019 Intramuscular   Manufacturer: [place]   Lot: [id number]   [place]: [id number]

## 2019-06-05 ENCOUNTER — Ambulatory Visit: Payer: Medicare Other

## 2019-06-14 ENCOUNTER — Ambulatory Visit (INDEPENDENT_AMBULATORY_CARE_PROVIDER_SITE_OTHER): Payer: Medicare Other | Admitting: Internal Medicine

## 2019-06-14 ENCOUNTER — Encounter: Payer: Self-pay | Admitting: Internal Medicine

## 2019-06-14 ENCOUNTER — Other Ambulatory Visit: Payer: Self-pay

## 2019-06-14 VITALS — BP 155/87 | HR 60 | Temp 96.6°F | Resp 18 | Ht 73.0 in | Wt 211.1 lb

## 2019-06-14 DIAGNOSIS — R030 Elevated blood-pressure reading, without diagnosis of hypertension: Secondary | ICD-10-CM

## 2019-06-14 DIAGNOSIS — I471 Supraventricular tachycardia: Secondary | ICD-10-CM | POA: Diagnosis not present

## 2019-06-14 NOTE — Patient Instructions (Addendum)
Please schedule Medicare Wellness with Glenard Haring.   GO TO THE FRONT DESK Come back for a checkup in 4 months, please make an appointment  Continue checking your blood pressures, as long as they are consistently below 145/85 is okay.

## 2019-06-14 NOTE — Progress Notes (Signed)
Pre visit review using our clinic review tool, if applicable. No additional management support is needed unless otherwise documented below in the visit note. 

## 2019-06-14 NOTE — Progress Notes (Signed)
Subjective:    Patient ID: Jeffrey Acosta, male    DOB: [birth year], 69 y.o.   MRN: [id number]  DOS:  06/14/2019 Type of visit - description: Follow-up Since the last office visit, saw cardiology.  Notes and results reviewed. They increase his metoprolol medication. The patient had dizziness,   episodes have significantly decreased, in the last month he only has 3 mild spells.   BP Readings from Last 3 Encounters:  06/14/19 (!) 155/87  04/11/19 132/78  03/21/19 (!) 142/91     Review of Systems No chest pain no difficulty breathing No lower extremity edema No diplopia, slurred speech or motor deficits  Past Medical History:  Diagnosis Date  . Alcoholism in remission ([place])    in [place]  . Bipolar affective disorder ([place])   . Diverticulosis of colon (without mention of hemorrhage)   . GERD (gastroesophageal reflux disease)   . Gout of multiple sites   . Hiatal hernia   . Hx of migraines   . Hydrocele   . HYPERLIPIDEMIA 01/10/2009   Qualifier: Diagnosis of  By: Ronnald Ramp CMA, [place]    NMR Lipoprofile 2011 LDL could not be calculated  Due to TG of 889  (811  / 750 ),  HDL 50 . Father MI @ 31 PGM CVA early 52s; PGF CVA @72    . Hypothyroidism   . Melanoma of skin, site unspecified 11/25/2012   07/2011 abdominal  Stage 1 Dr Amy [place] Dr Sarajane Jews   . Osteopenia   . Polyarthralgia     Past Surgical History:  Procedure Laterality Date  . BILIARY BRUSHING  12/07/2018   Procedure: BILIARY BRUSHING;  Surgeon: Rush Landmark Telford Nab., MD;  Location: [facility];  Service: Gastroenterology;;  . BILIARY DILATION  12/07/2018   Procedure: BILIARY DILATION;  Surgeon: Irving Copas., MD;  Location: [facility];  Service: Gastroenterology;;  . BIOPSY  12/07/2018   Procedure: BIOPSY;  Surgeon: Irving Copas., MD;  Location: [facility];  Service: Gastroenterology;;  . COLONOSCOPY  2003   negative ; Dr Sharlett Iles  . ERCP N/A 12/07/2018   Procedure: ENDOSCOPIC RETROGRADE  CHOLANGIOPANCREATOGRAPHY (ERCP);  Surgeon: Irving Copas., MD;  Location: [facility];  Service: Gastroenterology;  Laterality: N/A;  . ESOPHAGOGASTRODUODENOSCOPY (EGD) WITH PROPOFOL N/A 12/07/2018   Procedure: ESOPHAGOGASTRODUODENOSCOPY (EGD) WITH PROPOFOL;  Surgeon: Rush Landmark Telford Nab., MD;  Location: [facility];  Service: Gastroenterology;  Laterality: N/A;  . EUS N/A 12/07/2018   Procedure: UPPER ENDOSCOPIC ULTRASOUND (EUS) RADIAL;  Surgeon: Rush Landmark Telford Nab., MD;  Location: [facility];  Service: Gastroenterology;  Laterality: N/A;  . INGUINAL HERNIA REPAIR  08/07/2011   Procedure: HERNIA REPAIR INGUINAL ADULT;  Surgeon: Rolm Bookbinder, MD;  Location: [facility];  Service: General;  Laterality: Right;  . MELANOMA EXCISION  07/28/11   Dr Sarajane Jews; stomach and chest  . REMOVAL OF STONES  12/07/2018   Procedure: REMOVAL OF STONES;  Surgeon: Rush Landmark Telford Nab., MD;  Location: [facility];  Service: Gastroenterology;;  . [name]  1951  . 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 UPPER GASTROINTESTINAL ENDOSCOPY  1983   hiatal hernia  . VASECTOMY  1979    Allergies as of 06/14/2019      Reactions   Omnipaque [iohexol] Hives    Desc: developed hives after injection; resolved with 50 mg benadryl given to pt.      Medication List       Accurate as of June 14, 2019 11:59 PM. If  you have any questions, ask your nurse or doctor.        STOP taking these medications   metoprolol tartrate 25 MG tablet Commonly known as: LOPRESSOR Stopped by: Kathlene November, MD     TAKE these medications   allopurinol 300 MG tablet Commonly known as: ZYLOPRIM Take 1 tablet (300 mg total) by mouth daily.   aspirin 81 MG tablet Take 81 mg by mouth daily.   atorvastatin 20 MG tablet Commonly known as: LIPITOR TAKE 1 TABLET(20 MG) BY MOUTH AT BEDTIME   b complex vitamins capsule Take 1 capsule by mouth daily.   esomeprazole 40 MG  capsule Commonly known as: NEXIUM Take 1 capsule (40 mg total) by mouth daily before breakfast.   indomethacin 25 MG capsule Commonly known as: INDOCIN Take 25 mg by mouth every 12 (twelve) hours as needed (back pain).   lamoTRIgine 100 MG tablet Commonly known as: LAMICTAL Take 100 mg by mouth daily.   levothyroxine 50 MCG tablet Commonly known as: SYNTHROID Take 1 tablet (50 mcg total) by mouth daily before breakfast.   metoprolol succinate 100 MG 24 hr tablet Commonly known as: TOPROL-XL Take 1 tablet (100 mg total) by mouth daily. Take with or immediately following a meal.   multivitamin with minerals Tabs tablet Take 1 tablet by mouth daily.   naltrexone 50 MG tablet Commonly known as: DEPADE Take 50 mg by mouth daily.   QUEtiapine 400 MG tablet Commonly known as: SEROQUEL Take 800 mg by mouth at bedtime.   tamsulosin 0.4 MG Caps capsule Commonly known as: FLOMAX Take 1 capsule (0.4 mg total) by mouth daily after supper.   timolol 0.5 % ophthalmic solution Commonly known as: TIMOPTIC 1 drop 2 (two) times daily.   vitamin B-12 500 MCG tablet Commonly known as: CYANOCOBALAMIN Take 500 mcg by mouth daily.   Vitamin D 50 MCG (2000 UT) tablet Take 2,000 Units by mouth daily.   zinc gluconate 50 MG tablet Take 50 mg by mouth daily.             Objective:   Physical Exam BP (!) 155/87 (BP Location: Left Arm, Patient Position: Sitting, Cuff Size: Normal)   Pulse 60   Temp (!) 96.6 F (35.9 C) (Temporal)   Resp 18   Ht 6\' 1"  (1.854 m)   Wt 211 lb 2 oz (95.8 kg)   SpO2 100%   BMI 27.85 kg/m  General:   Well developed, NAD, BMI noted. HEENT:  Normocephalic . Face symmetric, atraumatic Lungs:  CTA B Normal respiratory effort, no intercostal retractions, no accessory muscle use. Heart: RRR,  no murmur.  Lower extremities: no pretibial edema bilaterally  Skin: Not pale. Not jaundice Neurologic:  alert & oriented X3.  Speech normal, gait  appropriate for age and unassisted Psych--  Cognition and judgment appear intact.  Cooperative with normal attention span and concentration.  Behavior appropriate. No anxious or depressed appearing.      Assessment     Assessment  Bipolar disorder-- Dr Toy Care, dc lithium 07-2014 EtOH abuse-- remission w/ occ relapse Hypothyroidism Hyperlipidemia E.D.  CV: Atrial Tachycardia dx 03/2019 MSK: ---Back pain: Saw Dr. Ramos/[place]off[place]~ 2002, 3 local injections, offered surgery.  ---Gout-- Dr Berna Bue, released to PCP 2018 Melanoma 2014 Dr. 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  Also Zio patch. Results: EF normal, no atrial fibrillation noted.  They change his metoprolol to XL, 100 mg daily. The patient feels very well, no symptoms, no side effect from the medication. Plan: Continue same care. Dizziness: Improved since metoprolol dose increased.. Evaded BP: Currently on metoprolol only, ambulatory BPs typically between 130 and low 140s.  Systolic in the [id number].  No change for now, monitor BPs, see AVS. RTC 4 months     This visit occurred during the SARS-CoV-2 public health emergency.  Safety protocols were in place, including screening questions prior to the visit, additional usage of staff PPE, and extensive cleaning of exam room while observing appropriate contact time as indicated for disinfecting solutions.

## 2019-06-15 NOTE — Assessment & Plan Note (Signed)
Atrial tachycardia: Was seen virtually by cardiology 04/12/2019, they recommended TTE.  Also Zio patch. Results: EF normal, no atrial fibrillation noted.  They change his metoprolol to XL, 100 mg daily. The patient feels very well, no symptoms, no side effect from the medication. Plan: Continue same care. Dizziness: Improved since metoprolol dose increased.. Hypertension: Currently on metoprolol only, ambulatory BPs typically between 130 and low 140s.  Systolic in the [id number].  No change for now, monitor BPs, see AVS. RTC 4 months

## 2019-06-28 ENCOUNTER — Ambulatory Visit: Payer: Medicare Other | Attending: Internal Medicine

## 2019-06-28 DIAGNOSIS — Z23 Encounter for immunization: Secondary | ICD-10-CM

## 2019-06-28 NOTE — Progress Notes (Signed)
   Covid-19 Vaccination Clinic  Name:  Jeffrey Acosta    MRN: [id number] DOB: 09/10/1950  06/28/2019  [name] was observed post Covid-19 immunization for 15 minutes without incident. He was provided with Vaccine Information Sheet and instruction to access the V-Safe system.   [name] was instructed to call 911 with any severe reactions post vaccine: Marland Kitchen Difficulty breathing  . Swelling of face and throat  . A fast heartbeat  . A bad rash all over body  . Dizziness and weakness   Immunizations Administered    Name Date Dose VIS Date Route   Pfizer COVID-19 Vaccine 06/28/2019  8:16 AM 0.3 mL 04/07/2019 Intramuscular   Manufacturer: [place]   Lot: [id number]   [place]: [id number]

## 2019-07-09 NOTE — Progress Notes (Signed)
Cardiology Office Note:   Date:  07/10/2019  NAME:  Jeffrey Acosta    MRN: [id number] DOB:  1950-10-11   PCP:  Colon Branch, MD  Cardiologist:  Evalina Field, MD   Referring MD: Colon Branch, MD   Chief Complaint  Patient presents with  . Follow-up    3 months.   History of Present Illness:   GARRUS ROLLEY is a 69 y.o. male with a hx of atrial tachycardia, HLD who presents for follow-up of atrial tachycardia. Started on metoprolol succinate after zio patch results. Follow-up today.  Reports he is doing much better since switching to metoprolol succinate 100 mg daily.  He reports he gets occasional symptoms of dizziness.  They occur 2 times per week.  They last 1 to 1-1/2-minute.  They can occur while sitting mainly.  No real identifiable trigger.  He reports he is walking 3 times per week.  Walks up to 2 miles.  Describes no chest pain or shortness of breath.  CVD risk factors include hyperlipidemia which is well controlled on Lipitor.  Blood pressure well controlled as well.  He overall appears to be doing really well since starting his Toprol succinate.  He is opted for continued medical therapy at this point.  Problem List 1. Atrial tachycardia  2. HLD -T chol 119, HDL 50, LDL 54, TG 75  Past Medical History: Past Medical History:  Diagnosis Date  . Alcoholism in remission ([place])    in [place]  . Bipolar affective disorder ([place])   . Diverticulosis of colon (without mention of hemorrhage)   . GERD (gastroesophageal reflux disease)   . Gout of multiple sites   . Hiatal hernia   . Hx of migraines   . Hydrocele   . HYPERLIPIDEMIA 01/10/2009   Qualifier: Diagnosis of  By: Ronnald Ramp CMA, Chemira    NMR Lipoprofile 2011 LDL could not be calculated  Due to TG of 889  (811  / 750 ),  HDL 50 . Father MI @ 44 PGM CVA early 15s; PGF CVA @72    . Hypothyroidism   . Melanoma of skin, site unspecified 11/25/2012   07/2011 abdominal  Stage 1 Dr Amy [place] Dr Sarajane Jews   . Osteopenia   . Polyarthralgia       Past Surgical History: Past Surgical History:  Procedure Laterality Date  . BILIARY BRUSHING  12/07/2018   Procedure: BILIARY BRUSHING;  Surgeon: Rush Landmark Telford Nab., MD;  Location: [facility];  Service: Gastroenterology;;  . BILIARY DILATION  12/07/2018   Procedure: BILIARY DILATION;  Surgeon: Irving Copas., MD;  Location: [facility];  Service: Gastroenterology;;  . BIOPSY  12/07/2018   Procedure: BIOPSY;  Surgeon: Irving Copas., MD;  Location: [facility];  Service: Gastroenterology;;  . COLONOSCOPY  2003   negative ; Dr Sharlett Iles  . ERCP N/A 12/07/2018   Procedure: ENDOSCOPIC RETROGRADE CHOLANGIOPANCREATOGRAPHY (ERCP);  Surgeon: Irving Copas., MD;  Location: [facility];  Service: Gastroenterology;  Laterality: N/A;  . ESOPHAGOGASTRODUODENOSCOPY (EGD) WITH PROPOFOL N/A 12/07/2018   Procedure: ESOPHAGOGASTRODUODENOSCOPY (EGD) WITH PROPOFOL;  Surgeon: Rush Landmark Telford Nab., MD;  Location: [facility];  Service: Gastroenterology;  Laterality: N/A;  . EUS N/A 12/07/2018   Procedure: UPPER ENDOSCOPIC ULTRASOUND (EUS) RADIAL;  Surgeon: Rush Landmark Telford Nab., MD;  [facility]: [facility];  Service: Gastroenterology;  Laterality: N/A;  . INGUINAL HERNIA REPAIR  08/07/2011   Procedure: HERNIA REPAIR INGUINAL ADULT;  Surgeon: Rolm Bookbinder, MD;  Location: [facility];  Service: General;  Laterality: Right;  . MELANOMA EXCISION  07/28/11   Dr Sarajane Jews; stomach and chest  . REMOVAL OF STONES  12/07/2018   Procedure: REMOVAL OF STONES;  Surgeon: Rush Landmark Telford Nab., MD;  Location: [facility];  Service: Gastroenterology;;  . [name]  1951  . 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 UPPER GASTROINTESTINAL ENDOSCOPY  1983   hiatal hernia  . VASECTOMY  1979    Current Medications: Current Meds  Medication Sig  . allopurinol (ZYLOPRIM) 300 MG tablet Take 1 tablet (300 mg total) by mouth daily.  Marland Kitchen aspirin 81 MG tablet  Take 81 mg by mouth daily.  Marland Kitchen atorvastatin (LIPITOR) 20 MG tablet TAKE 1 TABLET(20 MG) BY MOUTH AT BEDTIME  . b complex vitamins capsule Take 1 capsule by mouth daily.  . Cholecalciferol (VITAMIN D) 50 MCG (2000 UT) tablet Take 2,000 Units by mouth daily.  Marland Kitchen esomeprazole (NEXIUM) 40 MG capsule Take 1 capsule (40 mg total) by mouth daily before breakfast.  . indomethacin (INDOCIN) 25 MG capsule Take 25 mg by mouth every 12 (twelve) hours as needed (back pain).   Marland Kitchen lamoTRIgine (LAMICTAL) 100 MG tablet Take 100 mg by mouth daily.  Marland Kitchen levothyroxine (SYNTHROID, LEVOTHROID) 50 MCG tablet Take 1 tablet (50 mcg total) by mouth daily before breakfast.  . metoprolol succinate (TOPROL-XL) 100 MG 24 hr tablet Take 1 tablet (100 mg total) by mouth daily. Take with or immediately following a meal.  . Multiple Vitamin (MULTIVITAMIN WITH MINERALS) TABS tablet Take 1 tablet by mouth daily.  . naltrexone (DEPADE) 50 MG tablet Take 50 mg by mouth daily.   . QUEtiapine (SEROQUEL) 400 MG tablet Take 800 mg by mouth at bedtime.   . tamsulosin (FLOMAX) 0.4 MG CAPS capsule Take 1 capsule (0.4 mg total) by mouth daily after supper.  . timolol (TIMOPTIC) 0.5 % ophthalmic solution 1 drop 2 (two) times daily.  . vitamin B-12 (CYANOCOBALAMIN) 500 MCG tablet Take 500 mcg by mouth daily.  . [DISCONTINUED] zinc gluconate 50 MG tablet Take 50 mg by mouth daily.     Allergies:    Omnipaque [iohexol]   Social History: Social History   Socioeconomic History  . Marital status: Married    Spouse name: [name]  . Number of children: 3  . Years of education: Not on file  . Highest education level: Not on file  Occupational History  . Occupation: retired  Tobacco Use  . Smoking status: Never Smoker  . Smokeless tobacco: Never Used  Substance and Sexual Activity  . Alcohol use: Not Currently    Alcohol/week: 0.0 standard drinks    Comment: pt reports periodic alcohol  . Drug use: No  . Sexual activity: Yes    Partners:  Female    Comment: Having some issues with ED.  Would like provider's recommendation.    Other Topics Concern  . Not on file  Social History Narrative   1 child, 2 step children   Household- pt, wife    Leo Rod Keene (sister)   Social Determinants of Health   Financial Resource Strain:   . Difficulty of Paying Living Expenses:   Food Insecurity:   . Worried About Charity fundraiser in the Last Year:   . Arboriculturist in the Last Year:   Transportation Needs:   . Film/video editor (Medical):   Marland Kitchen Lack of Transportation (Non-Medical):   Physical Activity:   .  Days of Exercise per Week:   . Minutes of Exercise per Session:   Stress:   . Feeling of Stress :   Social Connections:   . Frequency of Communication with Friends and Family:   . Frequency of Social Gatherings with Friends and Family:   . Attends Religious Services:   . Active Member of Clubs or Organizations:   . Attends Archivist Meetings:   Marland Kitchen Marital Status:      Family History: The patient's family history includes COPD in his father and mother; Diabetes in his paternal grandmother; Heart attack (age of onset: 4) in his father; Lung cancer in his paternal grandfather; Parkinsonism in his paternal grandmother; Stroke (age of onset: 17) in his paternal grandmother; Stroke (age of onset: 65) in his maternal grandfather. There is no history of Colon cancer or Prostate cancer.  ROS:   All other ROS reviewed and negative. Pertinent positives noted in the HPI.     EKGs/Labs/Other Studies Reviewed:   The following studies were personally reviewed by me today:  7 Day Zio  1. Frequent episodes of likely ectopic atrial tachycardia.  2. No atrial fibrillation.  3. Rare ectopy.   TTE 04/19/2019 1. Left ventricular ejection fraction, by visual estimation, is 60 to  65%. The left ventricle has normal function. There is mildly increased  left ventricular hypertrophy.    2. Left ventricular diastolic parameters are indeterminate.  3. The left ventricle has no regional wall motion abnormalities.  4. Global right ventricle has normal systolic function.The right  ventricular size is normal. No increase in right ventricular wall  thickness.  5. Left atrial size was normal.  6. Right atrial size was normal.  7. Mild mitral annular calcification.  8. The mitral valve is normal in structure. Mild mitral valve  regurgitation. No evidence of mitral stenosis.  9. The tricuspid valve is normal in structure.  10. The aortic valve is tricuspid. Aortic valve regurgitation is trivial.  No evidence of aortic valve sclerosis or stenosis.  11. Pulmonic regurgitation is mild.  12. The pulmonic valve was normal in structure. Pulmonic valve  regurgitation is mild.  13. 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  14. Normal pulmonary artery systolic pressure.  15. The inferior vena cava is normal in size with greater than 50%  respiratory variability, suggesting right atrial pressure of 3 mmHg.   Recent Labs: 03/21/2019: ALT 12; BUN 16; Creatinine 1.28; Hemoglobin 14.0; Platelet Count 181; Potassium 4.3; Sodium 141 04/11/2019: Magnesium 1.6; TSH 1.24   Recent Lipid Panel    Component Value Date/Time   CHOL 119 04/11/2019 1206   TRIG 75.0 04/11/2019 1206   HDL 49.70 04/11/2019 1206   CHOLHDL 2 04/11/2019 1206   VLDL 15.0 04/11/2019 1206   LDLCALC 54 04/11/2019 1206   LDLDIRECT 79.0 05/10/2018 0758    Physical Exam:   VS:  BP 116/78 (BP Location: Left Arm, Patient Position: Sitting, Cuff Size: Normal)   Pulse 68   Temp (!) 97.2 F (36.2 C)   Ht 6\' 1"  (1.854 m)   Wt 212 lb (96.2 kg)   BMI 27.97 kg/m    Wt Readings from Last 3 Encounters:  07/10/19 212 lb (96.2 kg)  06/14/19 211 lb 2 oz (95.8 kg)  04/12/19 212 lb (96.2 kg)    General: Well nourished, well developed, in no acute distress Heart: Atraumatic, normal  size  Eyes: PEERLA, EOMI  Neck: Supple, no JVD  Endocrine: No thryomegaly Cardiac: Normal S1, S2; RRR; no murmurs, rubs, or gallops Lungs: Clear to auscultation bilaterally, no wheezing, rhonchi or rales  Abd: Soft, nontender, no hepatomegaly  Ext: No edema, pulses 2+ Musculoskeletal: No deformities, BUE and BLE strength normal and equal Skin: Warm and dry, no rashes   Neuro: Alert and oriented to person, place, time, and situation, CNII-XII grossly intact, no focal deficits  Psych: Normal mood and affect   ASSESSMENT:   Jeffrey Acosta is a 69 y.o. male who presents for the following: 1. Atrial tachycardia ([place])   2. Mixed hyperlipidemia     PLAN:   1. Atrial tachycardia ([place]) -Diagnosed on Zio patch.  Echocardiogram unremarkable.  Doing well metoprolol succinate 100 mg daily.  Symptoms down to around 1 minute and occur twice per week.  He reports he would like to continue his current medication dose at this time.  Symptoms seem acceptable to him.  He reports he is doing much better than previously.  2. Mixed hyperlipidemia -Most recent LDL 54.  We will continue his home Lipitor.   Disposition: Return in about 6 months (around 01/10/2020).  Medication Adjustments/Labs and Tests Ordered: Current medicines are reviewed at length with the patient today.  Concerns regarding medicines are outlined above.  No orders of the defined types were placed in this encounter.  No orders of the defined types were placed in this encounter.   Patient Instructions  Medication Instructions:  The current medical regimen is effective;  continue present plan and medications.  *If you need a refill on your cardiac medications before your next appointment, please call your pharmacy*   Follow-Up: At [facility], you and your health needs are our priority.  As part of our continuing mission to provide you with exceptional heart care, we have created designated Provider Care Teams.  These Care Teams  include your primary Cardiologist (physician) and Advanced Practice Providers (APPs -  Physician Assistants and Nurse Practitioners) who all work together to provide you with the care you need, when you need it.  We recommend signing up for the patient portal called "MyChart".  Sign up information is provided on this After Visit Summary.  MyChart is used to connect with patients for Virtual Visits (Telemedicine).  Patients are able to view lab/test results, encounter notes, upcoming appointments, etc.  Non-urgent messages can be sent to your provider as well.   To learn more about what you can do with MyChart, go to [email or web address].    Your next appointment:   6 month(s)  The format for your next appointment:   In Person  Provider:   Eleonore Chiquito, MD     Time Spent with Patient: I have spent a total of 25 minutes with patient reviewing hospital notes, telemetry, EKGs, labs and examining the patient as well as establishing an assessment and plan that was discussed with the patient.  > 50% of time was spent in direct patient care.  Signed, Addison Naegeli. Audie Box, [place]  [address] [place], [place] [address] [phone]  07/10/2019 8:22 AM

## 2019-07-10 ENCOUNTER — Ambulatory Visit: Payer: Medicare Other | Admitting: Cardiovascular Disease

## 2019-07-10 ENCOUNTER — Other Ambulatory Visit: Payer: Self-pay

## 2019-07-10 ENCOUNTER — Encounter: Payer: Self-pay | Admitting: Cardiovascular Disease

## 2019-07-10 VITALS — BP 116/78 | HR 68 | Temp 97.2°F | Ht 73.0 in | Wt 212.0 lb

## 2019-07-10 DIAGNOSIS — I471 Supraventricular tachycardia: Secondary | ICD-10-CM

## 2019-07-10 DIAGNOSIS — E782 Mixed hyperlipidemia: Secondary | ICD-10-CM | POA: Diagnosis not present

## 2019-07-10 NOTE — Patient Instructions (Signed)
Medication Instructions:  The current medical regimen is effective;  continue present plan and medications.  *If you need a refill on your cardiac medications before your next appointment, please call your pharmacy*   Follow-Up: At [facility], you and your health needs are our priority.  As part of our continuing mission to provide you with exceptional heart care, we have created designated Provider Care Teams.  These Care Teams include your primary Cardiologist (physician) and Advanced Practice Providers (APPs -  Physician Assistants and Nurse Practitioners) who all work together to provide you with the care you need, when you need it.  We recommend signing up for the patient portal called "MyChart".  Sign up information is provided on this After Visit Summary.  MyChart is used to connect with patients for Virtual Visits (Telemedicine).  Patients are able to view lab/test results, encounter notes, upcoming appointments, etc.  Non-urgent messages can be sent to your provider as well.   To learn more about what you can do with MyChart, go to [email or web address].    Your next appointment:   6 month(s)  The format for your next appointment:   In Person  Provider:   Cromberg O'Neal, MD      

## 2019-07-11 ENCOUNTER — Ambulatory Visit: Payer: Medicare Other | Admitting: Cardiovascular Disease

## 2019-08-01 ENCOUNTER — Other Ambulatory Visit: Payer: Self-pay

## 2019-08-01 MED ORDER — ALLOPURINOL 300 MG PO TABS: 300.0000 mg | ORAL_TABLET | Freq: Every day | ORAL | 3 refills | Status: DC

## 2019-10-16 ENCOUNTER — Other Ambulatory Visit: Payer: Self-pay

## 2019-10-16 ENCOUNTER — Ambulatory Visit (INDEPENDENT_AMBULATORY_CARE_PROVIDER_SITE_OTHER): Payer: Medicare Other | Admitting: Internal Medicine

## 2019-10-16 ENCOUNTER — Encounter: Payer: Self-pay | Admitting: Internal Medicine

## 2019-10-16 VITALS — BP 128/84 | HR 56 | Temp 96.4°F | Resp 16 | Ht 73.0 in | Wt 206.2 lb

## 2019-10-16 DIAGNOSIS — I471 Supraventricular tachycardia: Secondary | ICD-10-CM | POA: Diagnosis not present

## 2019-10-16 DIAGNOSIS — E7800 Pure hypercholesterolemia, unspecified: Secondary | ICD-10-CM

## 2019-10-16 DIAGNOSIS — K219 Gastro-esophageal reflux disease without esophagitis: Secondary | ICD-10-CM | POA: Diagnosis not present

## 2019-10-16 DIAGNOSIS — E039 Hypothyroidism, unspecified: Secondary | ICD-10-CM

## 2019-10-16 MED ORDER — LEVOTHYROXINE SODIUM 50 MCG PO TABS: 50.0000 ug | ORAL_TABLET | Freq: Every day | ORAL | 1 refills | Status: DC

## 2019-10-16 MED ORDER — ESOMEPRAZOLE MAGNESIUM 40 MG PO CPDR: 40.0000 mg | DELAYED_RELEASE_CAPSULE | Freq: Every day | ORAL | 1 refills | Status: DC

## 2019-10-16 NOTE — Assessment & Plan Note (Signed)
Bipolar: Well-controlled Hypothyroidism: Good compliance with meds, RF needed and sent Hyperlipidemia, last LDL very good, no change. Atrial tachycardia: Saw cardiology 07/10/2019, noted to be doing well.  RTC to recommended 6 months Gout: On allopurinol, no recent events GERD: RF PPIs History of melanoma: Sees dermatology every 9 months. RTC 3-4 months CPX

## 2019-10-16 NOTE — Progress Notes (Signed)
Subjective:    Patient ID: Jeffrey Acosta, male    DOB: 06/17/1950, 69 y.o.   MRN: [id number]  DOS:  10/16/2019 Type of visit - description: Routine checkup Has no concerns, states is feeling great. Note from cardiology reviewed. Good compliance with medications    Review of Systems Denies nausea, vomiting, diarrhea. Emotionally doing great. No chest pain no palpitations  Past Medical History:  Diagnosis Date  . Alcoholism in remission ([place])    in [place]  . Bipolar affective disorder ([place])   . Diverticulosis of colon (without mention of hemorrhage)   . GERD (gastroesophageal reflux disease)   . Gout of multiple sites   . Hiatal hernia   . Hx of migraines   . Hydrocele   . HYPERLIPIDEMIA 01/10/2009   Qualifier: Diagnosis of  By: Ronnald Ramp CMA, Chemira    NMR Lipoprofile 2011 LDL could not be calculated  Due to TG of 889  (811  / 750 ),  HDL 50 . Father MI @ 62 PGM CVA early 26s; PGF CVA @72    . Hypothyroidism   . Melanoma of skin, site unspecified 11/25/2012   07/2011 abdominal  Stage 1 Dr Amy [place] Dr Sarajane Jews   . Osteopenia   . Polyarthralgia     Past Surgical History:  Procedure Laterality Date  . BILIARY BRUSHING  12/07/2018   Procedure: BILIARY BRUSHING;  Surgeon: Rush Landmark Telford Nab., MD;  Location: [facility];  Service: Gastroenterology;;  . BILIARY DILATION  12/07/2018   Procedure: BILIARY DILATION;  Surgeon: Irving Copas., MD;  Location: [facility];  Service: Gastroenterology;;  . BIOPSY  12/07/2018   Procedure: BIOPSY;  Surgeon: Irving Copas., MD;  [facility]: [facility];  Service: Gastroenterology;;  . COLONOSCOPY  2003   negative ; Dr Sharlett Iles  . ERCP N/A 12/07/2018   Procedure: ENDOSCOPIC RETROGRADE CHOLANGIOPANCREATOGRAPHY (ERCP);  Surgeon: Irving Copas., MD;  Location: [facility];  Service: Gastroenterology;  Laterality: N/A;  . ESOPHAGOGASTRODUODENOSCOPY (EGD) WITH PROPOFOL N/A 12/07/2018   Procedure: ESOPHAGOGASTRODUODENOSCOPY  (EGD) WITH PROPOFOL;  Surgeon: Rush Landmark Telford Nab., MD;  [facility];  Service: Gastroenterology;  Laterality: N/A;  . EUS N/A 12/07/2018   Procedure: UPPER ENDOSCOPIC ULTRASOUND (EUS) RADIAL;  Surgeon: Rush Landmark Telford Nab., MD;  Location: [facility];  Service: Gastroenterology;  Laterality: N/A;  . INGUINAL HERNIA REPAIR  08/07/2011   Procedure: HERNIA REPAIR INGUINAL ADULT;  Surgeon: Rolm Bookbinder, MD;  Location: [facility];  Service: General;  Laterality: Right;  . MELANOMA EXCISION  07/28/11   Dr Sarajane Jews; stomach and chest  . REMOVAL OF STONES  12/07/2018   Procedure: REMOVAL OF STONES;  Surgeon: Rush Landmark Telford Nab., MD;  Location: [facility];  Service: Gastroenterology;;  . [name]  1951  . 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 UPPER GASTROINTESTINAL ENDOSCOPY  1983   hiatal hernia  . VASECTOMY  1979    Allergies as of 10/16/2019      Reactions   Omnipaque [iohexol] Hives    Desc: developed hives after injection; resolved with 50 mg benadryl given to pt.      Medication List       Accurate as of October 16, 2019  9:34 PM. If you have any questions, ask your nurse or doctor.        allopurinol 300 MG tablet Commonly known as: ZYLOPRIM Take 1 tablet (300 mg total) by mouth daily.   aspirin 81 MG tablet Take 81 mg by mouth daily.  atorvastatin 20 MG tablet Commonly known as: LIPITOR TAKE 1 TABLET(20 MG) BY MOUTH AT BEDTIME   b complex vitamins capsule Take 1 capsule by mouth daily.   esomeprazole 40 MG capsule Commonly known as: NEXIUM Take 1 capsule (40 mg total) by mouth daily before breakfast.   indomethacin 25 MG capsule Commonly known as: INDOCIN Take 25 mg by mouth every 12 (twelve) hours as needed (back pain).   lamoTRIgine 100 MG tablet Commonly known as: LAMICTAL Take 100 mg by mouth daily.   levothyroxine 50 MCG tablet Commonly known as: SYNTHROID Take 1 tablet (50 mcg total) by mouth daily  before breakfast.   metoprolol succinate 100 MG 24 hr tablet Commonly known as: TOPROL-XL Take 1 tablet (100 mg total) by mouth daily. Take with or immediately following a meal.   multivitamin with minerals Tabs tablet Take 1 tablet by mouth daily.   naltrexone 50 MG tablet Commonly known as: DEPADE Take 50 mg by mouth daily.   QUEtiapine 400 MG tablet Commonly known as: SEROQUEL Take 800 mg by mouth at bedtime.   tamsulosin 0.4 MG Caps capsule Commonly known as: FLOMAX Take 1 capsule (0.4 mg total) by mouth daily after supper.   timolol 0.5 % ophthalmic solution Commonly known as: TIMOPTIC 1 drop 2 (two) times daily.   vitamin B-12 500 MCG tablet Commonly known as: CYANOCOBALAMIN Take 500 mcg by mouth daily.   Vitamin D 50 MCG (2000 UT) tablet Take 2,000 Units by mouth daily.          Objective:   Physical Exam BP 128/84 (BP Location: Left Arm, Patient Position: Sitting, Cuff Size: Normal)   Pulse (!) 56   Temp (!) 96.4 F (35.8 C) (Temporal)   Resp 16   Ht 6\' 1"  (1.854 m)   Wt 206 lb 4 oz (93.6 kg)   SpO2 98%   BMI 27.21 kg/m  General:   Well developed, NAD, BMI noted. HEENT:  Normocephalic . Face symmetric, atraumatic Lungs:  CTA B Normal respiratory effort, no intercostal retractions, no accessory muscle use. Heart: RRR,  no murmur.  Lower extremities: no pretibial edema bilaterally  Skin: Not pale. Not jaundice Neurologic:  alert & oriented X3.  Speech normal, gait appropriate for age and unassisted Psych--  Cognition and judgment appear intact.  Cooperative with normal attention span and concentration.  Behavior appropriate. No anxious or depressed appearing.      Assessment     Assessment  Bipolar disorder-- Dr Toy Care, dc lithium 07-2014 EtOH abuse-- remission w/ occ relapse Hypothyroidism Hyperlipidemia E.D.  Atrial Tachycardia dx 03/2019, + Zio patch, echo negative MSK: ---Back pain: Saw Dr. Ramos/[place]~ 2002, 3 local injections,  offered surgery.  ---Gout-- Dr Berna Bue, released to PCP 2018 Melanoma 2014 Dr. 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 Atrial tachycardia: Saw cardiology 07/10/2019, noted to be doing well.  RTC to recommended 6 months Gout: On allopurinol, no recent events GERD: RF PPIs History of melanoma: Sees dermatology every 9 months. RTC 3-4 months CPX    This visit occurred during the SARS-CoV-2 public health emergency.  Safety protocols were in place, including screening questions  prior to the visit, additional usage of staff PPE, and extensive cleaning of exam room while observing appropriate contact time as indicated for disinfecting solutions.

## 2019-10-16 NOTE — Patient Instructions (Addendum)
Please schedule Medicare Wellness with Glenard Haring.     GO TO THE FRONT DESK, PLEASE SCHEDULE YOUR APPOINTMENTS Come back for a physical exam in 3 to 4 months

## 2019-10-16 NOTE — Progress Notes (Signed)
Pre visit review using our clinic review tool, if applicable. No additional management support is needed unless otherwise documented below in the visit note. 

## 2019-11-10 ENCOUNTER — Other Ambulatory Visit: Payer: Self-pay | Admitting: Internal Medicine

## 2020-01-07 NOTE — Progress Notes (Signed)
Cardiology Office Note:   Date:  01/08/2020  NAME:  Jeffrey Acosta    MRN: [id number] DOB:  [birth year]   PCP:  Colon Branch, MD  Cardiologist:  Evalina Field, MD   Referring MD: Colon Branch, MD   Chief Complaint  Patient presents with  . Follow-up    History of Present Illness:   Jeffrey Acosta is a 69 y.o. male with a hx of atrial tachycardia and hyperlipidemia who presents for follow-up. On metoprolol with infrequent symptoms.  He reports he still continues to do well on metoprolol.  He reports he may have one episode of dizziness every 2 weeks.  Symptoms last less than 1 minute.  He is not having any rapid heartbeat sensation or syncope.  He reports he is exercising.  He walks up to 2 miles 3 times a week without any limitations such as chest pain, shortness of breath or palpitations.  Blood pressure is well controlled today.  We did review his recent lipid profile which also shows that this is well controlled.  Overall doing quite well.  Needs a refill on his metoprolol.  He denies chest pain, shortness of breath or palpitations today in office.  Problem List 1. Atrial tachycardia  2. HLD -T chol 119, HDL 50, LDL 54, TG 75  Past Medical History: Past Medical History:  Diagnosis Date  . Alcoholism in remission ([place])    in [place]  . Arrhythmia    atrial tachycardia  . Bipolar affective disorder ([place])   . Diverticulosis of colon (without mention of hemorrhage)   . GERD (gastroesophageal reflux disease)   . Gout of multiple sites   . Hiatal hernia   . Hx of migraines   . Hydrocele   . HYPERLIPIDEMIA 01/10/2009   Qualifier: Diagnosis of  By: Ronnald Ramp CMA, Chemira    NMR Lipoprofile 2011 LDL could not be calculated  Due to TG of 889  (811  / 750 ),  HDL 50 . Father MI @ 1 PGM CVA early 58s; PGF CVA @72    . Hypothyroidism   . Melanoma of skin, site unspecified 11/25/2012   07/2011 abdominal  Stage 1 Dr Amy Martinique Dr Sarajane Jews   . Osteopenia   . Polyarthralgia     Past Surgical  History: Past Surgical History:  Procedure Laterality Date  . BILIARY BRUSHING  12/07/2018   Procedure: BILIARY BRUSHING;  Surgeon: Rush Landmark Telford Nab., MD;  Location: [facility];  Service: Gastroenterology;;  . BILIARY DILATION  12/07/2018   Procedure: BILIARY DILATION;  Surgeon: Irving Copas., MD;  Location: [facility];  Service: Gastroenterology;;  . BIOPSY  12/07/2018   Procedure: BIOPSY;  Surgeon: Irving Copas., MD;  Location: [facility];  Service: Gastroenterology;;  . COLONOSCOPY  2003   negative ; Dr Sharlett Iles  . ERCP N/A 12/07/2018   Procedure: ENDOSCOPIC RETROGRADE CHOLANGIOPANCREATOGRAPHY (ERCP);  Surgeon: Irving Copas., MD;  Location: [facility];  Service: Gastroenterology;  Laterality: N/A;  . ESOPHAGOGASTRODUODENOSCOPY (EGD) WITH PROPOFOL N/A 12/07/2018   Procedure: ESOPHAGOGASTRODUODENOSCOPY (EGD) WITH PROPOFOL;  Surgeon: Rush Landmark Telford Nab., MD;  [facility];  Service: Gastroenterology;  Laterality: N/A;  . EUS N/A 12/07/2018   Procedure: UPPER ENDOSCOPIC ULTRASOUND (EUS) RADIAL;  Surgeon: Rush Landmark Telford Nab., MD;  [facility]: [facility];  Service: Gastroenterology;  Laterality: N/A;  . INGUINAL HERNIA REPAIR  08/07/2011   Procedure: HERNIA REPAIR INGUINAL ADULT;  Surgeon: Rolm Bookbinder, MD;  Location: [facility];  Service: General;  Laterality: Right;  . MELANOMA EXCISION  07/28/11   Dr Sarajane Jews; stomach and chest  . REMOVAL OF STONES  12/07/2018   Procedure: REMOVAL OF STONES;  Surgeon: Rush Landmark Telford Nab., MD;  Location: [facility];  Service: Gastroenterology;;  . Jeffrey Acosta  1953  . 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 UPPER GASTROINTESTINAL ENDOSCOPY  1983   hiatal hernia  . VASECTOMY  1979    Current Medications: Current Meds  Medication Sig  . allopurinol (ZYLOPRIM) 300 MG tablet Take 1 tablet (300 mg total) by mouth daily.  Marland Kitchen aspirin 81 MG tablet Take 81 mg by mouth  daily.  Marland Kitchen atorvastatin (LIPITOR) 20 MG tablet TAKE 1 TABLET(20 MG) BY MOUTH AT BEDTIME  . b complex vitamins capsule Take 1 capsule by mouth daily.  . Cholecalciferol (VITAMIN D) 50 MCG (2000 UT) tablet Take 2,000 Units by mouth daily.  Marland Kitchen esomeprazole (NEXIUM) 40 MG capsule Take 1 capsule (40 mg total) by mouth daily before breakfast.  . indomethacin (INDOCIN) 25 MG capsule Take 25 mg by mouth every 12 (twelve) hours as needed (back pain).   Marland Kitchen lamoTRIgine (LAMICTAL) 25 MG tablet Take 25 mg by mouth daily.  Marland Kitchen levothyroxine (SYNTHROID) 50 MCG tablet Take 1 tablet (50 mcg total) by mouth daily before breakfast.  . Multiple Vitamin (MULTIVITAMIN WITH MINERALS) TABS tablet Take 1 tablet by mouth daily.  . naltrexone (DEPADE) 50 MG tablet Take 50 mg by mouth daily.   . QUEtiapine (SEROQUEL) 400 MG tablet Take 800 mg by mouth at bedtime.   . tamsulosin (FLOMAX) 0.4 MG CAPS capsule Take 1 capsule (0.4 mg total) by mouth daily after supper.  . timolol (TIMOPTIC) 0.5 % ophthalmic solution 1 drop 2 (two) times daily.  . vitamin B-12 (CYANOCOBALAMIN) 500 MCG tablet Take 500 mcg by mouth daily.     Allergies:    Omnipaque [iohexol]   Social History: Social History   Socioeconomic History  . Marital status: Married    Spouse name: [name]  . Number of children: 3  . Years of education: Not on file  . Highest education level: Not on file  Occupational History  . Occupation: retired  Tobacco Use  . Smoking status: Never Smoker  . Smokeless tobacco: Never Used  Vaping Use  . Vaping Use: Never used  Substance and Sexual Activity  . Alcohol use: Not Currently    Alcohol/week: 0.0 standard drinks    Comment: pt reports periodic alcohol  . Drug use: No  . Sexual activity: Yes    Partners: Female    Comment: Having some issues with ED.  Would like provider's recommendation.    Other Topics Concern  . Not on file  Social History Narrative   1 child, 2 step children   Household- pt, wife    [place] (sister)   Social Determinants of Health   Financial Resource Strain:   . Difficulty of Paying Living Expenses: Not on file  Food Insecurity:   . Worried About Charity fundraiser in the Last Year: Not on file  . Ran Out of Food in the Last Year: Not on file  Transportation Needs:   . Lack of Transportation (Medical): Not on file  . Lack of Transportation (Non-Medical): Not on file  Physical Activity:   . Days of Exercise per Week: Not on file  . Minutes of Exercise per Session: Not on file  Stress:   .  Feeling of Stress : Not on file  Social Connections:   . Frequency of Communication with Friends and Family: Not on file  . Frequency of Social Gatherings with Friends and Family: Not on file  . Attends Religious Services: Not on file  . Active Member of Clubs or Organizations: Not on file  . Attends Archivist Meetings: Not on file  . Marital Status: Not on file     Family History: The patient's = family history includes COPD in his father and mother; Diabetes in his paternal grandmother; Heart attack (age of onset: 107) in his father; Lung cancer in his paternal grandfather; Parkinsonism in his paternal grandmother; Stroke (age of onset: 2) in his paternal grandmother; Stroke (age of onset: 29) in his maternal grandfather. There is no history of Colon cancer or Prostate cancer.  ROS:   All other ROS reviewed and negative. Pertinent positives noted in the HPI.     EKGs/Labs/Other Studies Reviewed:   The following studies were personally reviewed by me today:  TTE 04/18/2020 1. Left ventricular ejection fraction, by visual estimation, is 60 to  65%. The left ventricle has normal function. There is mildly increased  left ventricular hypertrophy.  2. Left ventricular diastolic parameters are indeterminate.  3. The left ventricle has no regional wall motion abnormalities.  4. Global right ventricle has normal systolic  function.The right  ventricular size is normal. No increase in right ventricular wall  thickness.  5. Left atrial size was normal.  6. Right atrial size was normal.  7. Mild mitral annular calcification.  8. The mitral valve is normal in structure. Mild mitral valve  regurgitation. No evidence of mitral stenosis.  9. The tricuspid valve is normal in structure.  10. The aortic valve is tricuspid. Aortic valve regurgitation is trivial.  No evidence of aortic valve sclerosis or stenosis.  11. Pulmonic regurgitation is mild.  12. The pulmonic valve was normal in structure. Pulmonic valve  regurgitation is mild.  13. 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  14. Normal pulmonary artery systolic pressure.  15. The inferior vena cava is normal in size with greater than 50%  respiratory variability, suggesting right atrial pressure of 3 mmHg.   Zio 05/10/2019 1. Frequent episodes of likely ectopic atrial tachycardia.  2. No atrial fibrillation.  3. Rare ectopy.    Recent Labs: 03/21/2019: ALT 12; BUN 16; Creatinine 1.28; Hemoglobin 14.0; Platelet Count 181; Potassium 4.3; Sodium 141 04/11/2019: Magnesium 1.6; TSH 1.24   Recent Lipid Panel    Component Value Date/Time   CHOL 119 04/11/2019 1206   TRIG 75.0 04/11/2019 1206   HDL 49.70 04/11/2019 1206   CHOLHDL 2 04/11/2019 1206   VLDL 15.0 04/11/2019 1206   LDLCALC 54 04/11/2019 1206   LDLDIRECT 79.0 05/10/2018 0758    Physical Exam:   VS:  BP 132/84   Pulse 69   Ht 6\' 1"  (1.854 m)   Wt 201 lb 6.4 oz (91.4 kg)   SpO2 98%   BMI 26.57 kg/m    Wt Readings from Last 3 Encounters:  01/08/20 201 lb 6.4 oz (91.4 kg)  10/16/19 206 lb 4 oz (93.6 kg)  07/10/19 212 lb (96.2 kg)    General: Well nourished, well developed, in no acute distress Heart: Atraumatic, normal size  Eyes: PEERLA, EOMI  Neck: Supple, no JVD Endocrine: No thryomegaly Cardiac: Normal S1, S2; RRR; no murmurs, rubs, or  gallops Lungs: Clear to  auscultation bilaterally, no wheezing, rhonchi or rales  Abd: Soft, nontender, no hepatomegaly  Ext: No edema, pulses 2+ Musculoskeletal: No deformities, BUE and BLE strength normal and equal Skin: Warm and dry, no rashes   Neuro: Alert and oriented to person, place, time, and situation, CNII-XII grossly intact, no focal deficits  Psych: Normal mood and affect   ASSESSMENT:   Jeffrey Acosta is a 69 y.o. male who presents for the following: 1. Atrial tachycardia (Jeffrey Acosta)   2. Mixed hyperlipidemia     PLAN:   1. Atrial tachycardia ([place]) -He continues to do well on metoprolol succinate.  Infrequent symptoms.  We will continue metoprolol succinate 100 mg daily.  He will let us know if he has any issues.  No limitations with exercise capacity.  Overall doing quite well.  We will see him yearly.  2. Mixed hyperlipidemia -Well-controlled on Lipitor 20 mg daily.   Disposition: Return in about 1 year (around 01/07/2021).  Medication Adjustments/Labs and Tests Ordered: Current medicines are reviewed at length with the patient today.  Concerns regarding medicines are outlined above.  No orders of the defined types were placed in this encounter.  Meds ordered this encounter  Medications  . metoprolol succinate (TOPROL-XL) 100 MG 24 hr tablet    Sig: Take 1 tablet (100 mg total) by mouth daily. Take with or immediately following a meal.    Dispense:  90 tablet    Refill:  3    Patient Instructions  Medication Instructions:  The current medical regimen is effective;  continue present plan and medications.  *If you need a refill on your cardiac medications before your next appointment, please call your pharmacy*   Follow-Up: At [facility], you and your health needs are our priority.  As part of our continuing mission to provide you with exceptional heart care, we have created designated Provider Care Teams.  These Care Teams include your primary Cardiologist  (physician) and Advanced Practice Providers (APPs -  Physician Assistants and Nurse Practitioners) who all work together to provide you with the care you need, when you need it.  We recommend signing up for the patient portal called "MyChart".  Sign up information is provided on this After Visit Summary.  MyChart is used to connect with patients for Virtual Visits (Telemedicine).  Patients are able to view lab/test results, encounter notes, upcoming appointments, etc.  Non-urgent messages can be sent to your provider as well.   To learn more about what you can do with MyChart, go to [email or web address].    Your next appointment:   12 month(s)  The format for your next appointment:   In Person  Provider:   Eleonore Chiquito, MD       Time Spent with Patient: I have spent a total of 25 minutes with patient reviewing hospital notes, telemetry, EKGs, labs and examining the patient as well as establishing an assessment and plan that was discussed with the patient.  > 50% of time was spent in direct patient care.  Signed, Addison Naegeli. Audie Box, [place]  [address], [place], [place] [address] [phone]  01/08/2020 8:07 AM

## 2020-01-08 ENCOUNTER — Ambulatory Visit: Payer: Medicare Other | Admitting: Cardiovascular Disease

## 2020-01-08 ENCOUNTER — Other Ambulatory Visit: Payer: Self-pay

## 2020-01-08 ENCOUNTER — Encounter: Payer: Self-pay | Admitting: Cardiovascular Disease

## 2020-01-08 VITALS — BP 132/84 | HR 69 | Ht 73.0 in | Wt 201.4 lb

## 2020-01-08 DIAGNOSIS — E782 Mixed hyperlipidemia: Secondary | ICD-10-CM

## 2020-01-08 DIAGNOSIS — I471 Supraventricular tachycardia: Secondary | ICD-10-CM | POA: Diagnosis not present

## 2020-01-08 MED ORDER — METOPROLOL SUCCINATE ER 100 MG PO TB24: 100.0000 mg | ORAL_TABLET | Freq: Every day | ORAL | 3 refills | Status: DC

## 2020-01-08 NOTE — Patient Instructions (Signed)

## 2020-01-11 ENCOUNTER — Other Ambulatory Visit: Payer: Self-pay | Admitting: Internal Medicine

## 2020-03-14 ENCOUNTER — Telehealth: Payer: Self-pay | Admitting: *Deleted

## 2020-03-14 ENCOUNTER — Other Ambulatory Visit: Payer: Medicare Other

## 2020-03-14 ENCOUNTER — Ambulatory Visit (INDEPENDENT_AMBULATORY_CARE_PROVIDER_SITE_OTHER): Payer: Medicare Other | Admitting: Internal Medicine

## 2020-03-14 ENCOUNTER — Other Ambulatory Visit: Payer: Self-pay

## 2020-03-14 ENCOUNTER — Encounter: Payer: Self-pay | Admitting: Internal Medicine

## 2020-03-14 VITALS — BP 124/90 | HR 62 | Temp 98.1°F | Resp 16 | Ht 73.0 in | Wt 202.0 lb

## 2020-03-14 DIAGNOSIS — R7989 Other specified abnormal findings of blood chemistry: Secondary | ICD-10-CM

## 2020-03-14 DIAGNOSIS — Z23 Encounter for immunization: Secondary | ICD-10-CM

## 2020-03-14 DIAGNOSIS — E781 Pure hyperglyceridemia: Secondary | ICD-10-CM | POA: Diagnosis not present

## 2020-03-14 DIAGNOSIS — D72819 Decreased white blood cell count, unspecified: Secondary | ICD-10-CM

## 2020-03-14 DIAGNOSIS — Z0000 Encounter for general adult medical examination without abnormal findings: Secondary | ICD-10-CM | POA: Diagnosis not present

## 2020-03-14 DIAGNOSIS — E039 Hypothyroidism, unspecified: Secondary | ICD-10-CM

## 2020-03-14 DIAGNOSIS — Z125 Encounter for screening for malignant neoplasm of prostate: Secondary | ICD-10-CM | POA: Diagnosis not present

## 2020-03-14 DIAGNOSIS — F1021 Alcohol dependence, in remission: Secondary | ICD-10-CM

## 2020-03-14 DIAGNOSIS — D696 Thrombocytopenia, unspecified: Secondary | ICD-10-CM | POA: Diagnosis not present

## 2020-03-14 LAB — CBC WITH DIFFERENTIAL/PLATELET
Basophils Absolute: 0 10*3/uL (ref 0.0–0.1)
Basophils Relative: 1.7 % (ref 0.0–3.0)
Eosinophils Absolute: 0.1 10*3/uL (ref 0.0–0.7)
Eosinophils Relative: 3.2 % (ref 0.0–5.0)
HCT: 38 % — ABNORMAL LOW (ref 39.0–52.0)
Hemoglobin: 13.4 g/dL (ref 13.0–17.0)
Lymphocytes Relative: 36.1 % (ref 12.0–46.0)
Lymphs Abs: 0.9 10*3/uL (ref 0.7–4.0)
MCHC: 35.2 g/dL (ref 30.0–36.0)
MCV: 101.3 fl — ABNORMAL HIGH (ref 78.0–100.0)
Monocytes Absolute: 0.3 10*3/uL (ref 0.1–1.0)
Monocytes Relative: 13 % — ABNORMAL HIGH (ref 3.0–12.0)
Neutro Abs: 1.1 10*3/uL — ABNORMAL LOW (ref 1.4–7.7)
Neutrophils Relative %: 46 % (ref 43.0–77.0)
Platelets: 110 10*3/uL — ABNORMAL LOW (ref 150.0–400.0)
RBC: 3.75 Mil/uL — ABNORMAL LOW (ref 4.22–5.81)
RDW: 14.7 % (ref 11.5–15.5)
WBC: 2.5 10*3/uL — ABNORMAL LOW (ref 4.0–10.5)

## 2020-03-14 LAB — COMPREHENSIVE METABOLIC PANEL
ALT: 22 U/L (ref 0–53)
AST: 36 U/L (ref 0–37)
Albumin: 3.8 g/dL (ref 3.5–5.2)
Alkaline Phosphatase: 53 U/L (ref 39–117)
BUN: 16 mg/dL (ref 6–23)
CO2: 29 mEq/L (ref 19–32)
Calcium: 8.9 mg/dL (ref 8.4–10.5)
Chloride: 105 mEq/L (ref 96–112)
Creatinine, Ser: 1.35 mg/dL (ref 0.40–1.50)
GFR: 52.84 mL/min — ABNORMAL LOW (ref >60.00)
Glucose, Bld: 94 mg/dL (ref 70–99)
Potassium: 4 mEq/L (ref 3.5–5.1)
Sodium: 143 mEq/L (ref 135–145)
Total Bilirubin: 0.9 mg/dL (ref 0.2–1.2)
Total Protein: 5.7 g/dL — ABNORMAL LOW (ref 6.0–8.3)

## 2020-03-14 LAB — LIPID PANEL
Cholesterol: 186 mg/dL (ref 0–200)
HDL: 107.7 mg/dL (ref >39.00)
NonHDL: 78.78
Total CHOL/HDL Ratio: 2
Triglycerides: 253 mg/dL — ABNORMAL HIGH (ref 0.0–149.0)
VLDL: 50.6 mg/dL — ABNORMAL HIGH (ref 0.0–40.0)

## 2020-03-14 LAB — PSA: PSA: 1.73 ng/mL (ref 0.10–4.00)

## 2020-03-14 LAB — LDL CHOLESTEROL, DIRECT: Direct LDL: 41 mg/dL

## 2020-03-14 LAB — TSH: TSH: 0.91 u[IU]/mL (ref 0.35–4.50)

## 2020-03-14 NOTE — Patient Instructions (Addendum)
Check the  blood pressure 2 or 3 times a month  BP GOAL is between 110/65 and  135/85. If it is consistently higher or lower, let me know   Continue taking vitamins: Vitamin B, a multivitamin, vitamin D-3:  2000 units daily   GO TO THE LAB : Get the blood work     [place], [place] back for   a checkup in 6 months

## 2020-03-14 NOTE — Progress Notes (Signed)
Pre visit review using our clinic review tool, if applicable. No additional management support is needed unless otherwise documented below in the visit note. 

## 2020-03-14 NOTE — Assessment & Plan Note (Addendum)
Here for CPX Bipolar: Follow-up by psych.  He seems to be doing well, I did observe he processes information slow today. EtOH: Last relapse 6 months ago, currently doing well, goes to 11 meetings weekly (AA) Hypothyroidism: Checking TSH Hyperlipidemia: On Lipitor, checking labs Atrial tachycardia: Saw cardiology 12-2019, rec to continue metoprolol.  Follow-up yearly GERD, Barrett's, cholelithiasis: Currently asx.  No follow-up scheduled with GI for now. Osteopenia: Encourage calcium and vitamin D.  Last level okay. Addendum: CBC showed leukopenia, thrombocytopenia, increased MCV.  Adding a pathology smear. RTC 6 months

## 2020-03-14 NOTE — Progress Notes (Signed)
Subjective:    Patient ID: Jeffrey Acosta, male    DOB: 1950/08/04, 69 y.o.   MRN: [id number]  DOS:  03/14/2020 Type of visit - description: CPX Since the last office visit is doing well has no major concerns, ROS is negative, specifically denies dysphagia, odynophagia, GERD symptoms, not taking NSAIDs frequently   Review of Systems     A 14 point review of systems is negative    Past Medical History:  Diagnosis Date  . Alcoholism in remission ([place])    in [place]  . Arrhythmia    atrial tachycardia  . Bipolar affective disorder ([place])   . Diverticulosis of colon (without mention of hemorrhage)   . GERD (gastroesophageal reflux disease)   . Gout of multiple sites   . Hiatal hernia   . Hx of migraines   . Hydrocele   . HYPERLIPIDEMIA 01/10/2009   Qualifier: Diagnosis of  By: Ronnald Ramp CMA, Chemira    NMR Lipoprofile 2011 LDL could not be calculated  Due to TG of 889  (811  / 750 ),  HDL 50 . Father MI @ 58 PGM CVA early 82s; PGF CVA @72    . Hypothyroidism   . Melanoma of skin, site unspecified 11/25/2012   07/2011 abdominal  Stage 1 Dr Amy Martinique Dr Sarajane Jews   . Osteopenia   . Polyarthralgia     Past Surgical History:  Procedure Laterality Date  . BILIARY BRUSHING  12/07/2018   Procedure: BILIARY BRUSHING;  Surgeon: Rush Landmark Telford Nab., MD;  Location: [facility];  Service: Gastroenterology;;  . BILIARY DILATION  12/07/2018   Procedure: BILIARY DILATION;  Surgeon: Irving Copas., MD;  Location: [facility];  Service: Gastroenterology;;  . BIOPSY  12/07/2018   Procedure: BIOPSY;  Surgeon: Irving Copas., MD;  [facility];  Service: Gastroenterology;;  . COLONOSCOPY  2003   negative ; Dr Sharlett Iles  . ERCP N/A 12/07/2018   Procedure: ENDOSCOPIC RETROGRADE CHOLANGIOPANCREATOGRAPHY (ERCP);  Surgeon: Irving Copas., MD;  Location: [facility];  Service: Gastroenterology;  Laterality: N/A;  . ESOPHAGOGASTRODUODENOSCOPY (EGD) WITH PROPOFOL N/A  12/07/2018   Procedure: ESOPHAGOGASTRODUODENOSCOPY (EGD) WITH PROPOFOL;  Surgeon: Rush Landmark Telford Nab., MD;  [facility];  Service: Gastroenterology;  Laterality: N/A;  . EUS N/A 12/07/2018   Procedure: UPPER ENDOSCOPIC ULTRASOUND (EUS) RADIAL;  Surgeon: Rush Landmark Telford Nab., MD;  [facility];  Service: Gastroenterology;  Laterality: N/A;  . INGUINAL HERNIA REPAIR  08/07/2011   Procedure: HERNIA REPAIR INGUINAL ADULT;  Surgeon: Rolm Bookbinder, MD;  Location: [facility];  Service: General;  Laterality: Right;  . MELANOMA EXCISION  07/28/11   Dr Sarajane Jews; stomach and chest  . REMOVAL OF STONES  12/07/2018   Procedure: REMOVAL OF STONES;  Surgeon: Rush Landmark Telford Nab., MD;  Location: [facility];  Service: Gastroenterology;;  . [name]  1953  . 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 UPPER GASTROINTESTINAL ENDOSCOPY  1983   hiatal hernia  . VASECTOMY  1979    Allergies as of 03/14/2020      Reactions   Omnipaque [iohexol] Hives    Desc: developed hives after injection; resolved with 50 mg benadryl given to pt.      Medication List       Accurate as of March 14, 2020  3:12 PM. If you have any questions, ask your nurse or doctor.        allopurinol 300 MG tablet Commonly known as: ZYLOPRIM Take 1 tablet (  300 mg total) by mouth daily.   aspirin 81 MG tablet Take 81 mg by mouth daily.   atorvastatin 20 MG tablet Commonly known as: LIPITOR TAKE 1 TABLET(20 MG) BY MOUTH AT BEDTIME   b complex vitamins capsule Take 1 capsule by mouth daily.   esomeprazole 40 MG capsule Commonly known as: NEXIUM Take 1 capsule (40 mg total) by mouth daily before breakfast.   indomethacin 25 MG capsule Commonly known as: INDOCIN Take 25 mg by mouth every 12 (twelve) hours as needed (back pain).   lamoTRIgine 25 MG tablet Commonly known as: LAMICTAL Take 25 mg by mouth daily.   levothyroxine 50 MCG tablet Commonly known as:  SYNTHROID Take 1 tablet (50 mcg total) by mouth daily before breakfast.   metoprolol succinate 100 MG 24 hr tablet Commonly known as: TOPROL-XL Take 1 tablet (100 mg total) by mouth daily. Take with or immediately following a meal.   multivitamin with minerals Tabs tablet Take 1 tablet by mouth daily.   naltrexone 50 MG tablet Commonly known as: DEPADE Take 50 mg by mouth daily.   QUEtiapine 400 MG tablet Commonly known as: SEROQUEL Take 800 mg by mouth at bedtime.   tamsulosin 0.4 MG Caps capsule Commonly known as: FLOMAX Take 1 capsule (0.4 mg total) by mouth daily after supper.   timolol 0.5 % ophthalmic solution Commonly known as: TIMOPTIC 1 drop 2 (two) times daily.   vitamin B-12 500 MCG tablet Commonly known as: CYANOCOBALAMIN Take 500 mcg by mouth daily.   Vitamin D 50 MCG (2000 UT) tablet Take 2,000 Units by mouth daily.          Objective:   Physical Exam BP 124/90 (BP Location: Left Arm, Patient Position: Sitting, Cuff Size: Normal)   Pulse 62   Temp 98.1 F (36.7 C) (Oral)   Resp 16   Ht 6\' 1"  (1.854 m)   Wt 202 lb (91.6 kg)   SpO2 99%   BMI 26.65 kg/m  General: Well developed, NAD, BMI noted Neck: No  thyromegaly  HEENT:  Normocephalic . Face symmetric, atraumatic Lungs:  CTA B Normal respiratory effort, no intercostal retractions, no accessory muscle use. Heart: RRR,  no murmur.  Abdomen:  Not distended, soft, non-tender. No rebound or rigidity.   Lower extremities: no pretibial edema bilaterally DRE: Normal sphincter tone, no stools, prostate normal Skin: Exposed areas without rash. Not pale. Not jaundice Neurologic:  alert & oriented X3.  Speech normal, somewhat slow processing information. Gait appropriate for age and unassisted Strength symmetric and appropriate for age.  Psych: Cognition and judgment appear intact.  Cooperative with normal attention span and concentration.  Behavior appropriate. No anxious or depressed  appearing.     Assessment     Assessment  Bipolar disorder-- Dr Toy Care, dc lithium 07-2014 EtOH abuse-- remission w/ occ relapse Hypothyroidism Hyperlipidemia E.D.  Atrial Tachycardia dx 03/2019, + Zio patch,  MSK: ---Back pain: Saw Dr. Ramos/[place]~ 2002, 3 local injections, offered surgery.  ---Gout: Dr Berna Bue, released to PCP 2018 Melanoma 2014 Dr. [place]ique, sees derm regularly as off 02-2020 Osteopenia  T score -2.4  ( [phone];  T score is -2.0  (08/2018). 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  He seems to be doing  well, I did observe he processes information slow today. EtOH: Last relapse 6 months ago, currently doing well, goes to 11 meetings weekly (AA) Hypothyroidism: Checking TSH Hyperlipidemia: On Lipitor, checking labs Atrial tachycardia: Saw cardiology 12-2019, rec to continue metoprolol.  Follow-up yearly GERD, Barrett's, cholelithiasis: Currently asx.  No follow-up scheduled with GI for now. Osteopenia: Encourage calcium and vitamin D.  Last level okay. Addendum: CBC showed leukopenia, thrombocytopenia, increased MCV.  Adding a pathology smear. RTC 6 months    Today, in addition to CPX, I addressed chronic medical problems and 2 new problems: Leukopenia, thrombocytopenia.  This visit occurred during the SARS-CoV-2 public health emergency.  Safety protocols were in place, including screening questions prior to the visit, additional usage of staff PPE, and extensive cleaning of exam room while observing appropriate contact time as indicated for disinfecting solutions.

## 2020-03-14 NOTE — Assessment & Plan Note (Signed)
-  Tdap: 2014 - prevnar-- 2016; PNA shot 23: 2009, 2019 -Shingrix discussed before - COVID vaccine: x 2, booster rec! - flu shot: today -CCS: Last cscope  2015. Some Diverticulosis , Dr Henrene Pastor, 10 years  -Prostate cancer screening: DRE wnl, check a PSA - labs:CMP, FLP, CBC, TSH, PSA -Diet exercise discussed.

## 2020-03-14 NOTE — Telephone Encounter (Signed)
[  3:27 PM] Damita Dunnings Can we add a pathology review on the CBC w/ diff on MRN: [id number]- I know the blood is at [facility] but wasn't sure how to get them to send to [facility]  [3:44 PM] [place], Kaylyn abnormal cbc and leukopenia

## 2020-03-14 NOTE — Telephone Encounter (Signed)
Future order placed for [facility] and add on request faxed to [facility] to send specimen to [facility] for additional testing.

## 2020-03-15 LAB — PATHOLOGIST SMEAR REVIEW

## 2020-03-16 ENCOUNTER — Telehealth: Payer: Self-pay | Admitting: Internal Medicine

## 2020-03-16 DIAGNOSIS — D61818 Other pancytopenia: Secondary | ICD-10-CM

## 2020-03-16 NOTE — Telephone Encounter (Signed)
Attempted to call the patient, liked to review his alcohol intake recently. No answer.  Please call the patient: Advised that his white cells and platelets are low.  Other labs are okay. Could be related to low vitamins but also due to recent alcohol intake.  Please inquire about EtOH in the last few weeks. Recommend to recheck labs to confirm: CBC, G14, folic acid, thiamine, DX pancytopenia. Likely will need hematology referral.

## 2020-03-18 DIAGNOSIS — H25043 Posterior subcapsular polar age-related cataract, bilateral: Secondary | ICD-10-CM | POA: Diagnosis not present

## 2020-03-18 DIAGNOSIS — H401131 Primary open-angle glaucoma, bilateral, mild stage: Secondary | ICD-10-CM | POA: Diagnosis not present

## 2020-03-18 DIAGNOSIS — H2513 Age-related nuclear cataract, bilateral: Secondary | ICD-10-CM | POA: Diagnosis not present

## 2020-03-18 DIAGNOSIS — H25013 Cortical age-related cataract, bilateral: Secondary | ICD-10-CM | POA: Diagnosis not present

## 2020-03-18 NOTE — Telephone Encounter (Signed)
Called, spoke with the wife, he is not at home.  Will try again

## 2020-03-18 NOTE — Telephone Encounter (Signed)
Spoke with the patient. He has been drinking heavily lately.  That may account for some of the findings. Plan: Labs tomorrow at 10:15 AM.  Please enter orders: CBC, Y24, folic acid, thiamine, reticulocyte count.  DX pancytopenia

## 2020-03-18 NOTE — Telephone Encounter (Signed)
Orders placed.

## 2020-03-19 ENCOUNTER — Other Ambulatory Visit: Payer: Self-pay

## 2020-03-19 ENCOUNTER — Other Ambulatory Visit (INDEPENDENT_AMBULATORY_CARE_PROVIDER_SITE_OTHER): Payer: Medicare Other

## 2020-03-19 DIAGNOSIS — D61818 Other pancytopenia: Secondary | ICD-10-CM

## 2020-03-19 DIAGNOSIS — F1010 Alcohol abuse, uncomplicated: Secondary | ICD-10-CM

## 2020-03-19 LAB — CBC WITH DIFFERENTIAL/PLATELET
Basophils Absolute: 0 10*3/uL (ref 0.0–0.1)
Basophils Relative: 1.1 % (ref 0.0–3.0)
Eosinophils Absolute: 0.1 10*3/uL (ref 0.0–0.7)
Eosinophils Relative: 2.9 % (ref 0.0–5.0)
HCT: 38.3 % — ABNORMAL LOW (ref 39.0–52.0)
Hemoglobin: 13.4 g/dL (ref 13.0–17.0)
Lymphocytes Relative: 24.9 % (ref 12.0–46.0)
Lymphs Abs: 0.7 10*3/uL (ref 0.7–4.0)
MCHC: 34.9 g/dL (ref 30.0–36.0)
MCV: 100.8 fl — ABNORMAL HIGH (ref 78.0–100.0)
Monocytes Absolute: 0.4 10*3/uL (ref 0.1–1.0)
Monocytes Relative: 12.4 % — ABNORMAL HIGH (ref 3.0–12.0)
Neutro Abs: 1.7 10*3/uL (ref 1.4–7.7)
Neutrophils Relative %: 58.7 % (ref 43.0–77.0)
Platelets: 118 10*3/uL — ABNORMAL LOW (ref 150.0–400.0)
RBC: 3.79 Mil/uL — ABNORMAL LOW (ref 4.22–5.81)
RDW: 14.9 % (ref 11.5–15.5)
WBC: 3 10*3/uL — ABNORMAL LOW (ref 4.0–10.5)

## 2020-03-19 LAB — B12 AND FOLATE PANEL
Folate: >23.6 ng/mL (ref >5.9)
Vitamin B-12: 1506 pg/mL — ABNORMAL HIGH (ref 211–911)

## 2020-03-19 LAB — RETICULOCYTES
ABS Retic: 83950 cells/uL (ref 25000–9000)
Retic Ct Pct: 2.3 %

## 2020-03-20 NOTE — Addendum Note (Signed)
Addended byDamita Dunnings D on: 03/20/2020 12:51 PM   Modules accepted: Orders

## 2020-03-25 LAB — VITAMIN B1: Vitamin B1 (Thiamine): 86 nmol/L — ABNORMAL HIGH (ref 8–30)

## 2020-03-26 ENCOUNTER — Telehealth: Payer: Self-pay | Admitting: Family

## 2020-03-26 NOTE — Telephone Encounter (Signed)
New Patient appointments scheduled letter & calendar was mailed. Referring office was notified as well

## 2020-03-28 ENCOUNTER — Other Ambulatory Visit: Payer: Self-pay | Admitting: Internal Medicine

## 2020-03-28 MED ORDER — THIAMINE HCL 100 MG PO TABS: 100.0000 mg | ORAL_TABLET | Freq: Every day | ORAL | 1 refills | Status: DC

## 2020-04-15 ENCOUNTER — Other Ambulatory Visit: Payer: Self-pay | Admitting: Family

## 2020-04-15 DIAGNOSIS — D649 Anemia, unspecified: Secondary | ICD-10-CM

## 2020-04-16 ENCOUNTER — Other Ambulatory Visit: Payer: Self-pay

## 2020-04-16 ENCOUNTER — Inpatient Hospital Stay (HOSPITAL_BASED_OUTPATIENT_CLINIC_OR_DEPARTMENT_OTHER): Payer: Medicare Other | Admitting: Family

## 2020-04-16 ENCOUNTER — Inpatient Hospital Stay: Payer: Medicare Other | Attending: Hematology & Oncology

## 2020-04-16 ENCOUNTER — Encounter: Payer: Self-pay | Admitting: Family

## 2020-04-16 VITALS — BP 161/78 | Wt 203.0 lb

## 2020-04-16 DIAGNOSIS — Z820 Family history of epilepsy and other diseases of the nervous system: Secondary | ICD-10-CM

## 2020-04-16 DIAGNOSIS — Z8249 Family history of ischemic heart disease and other diseases of the circulatory system: Secondary | ICD-10-CM | POA: Insufficient documentation

## 2020-04-16 DIAGNOSIS — Z833 Family history of diabetes mellitus: Secondary | ICD-10-CM | POA: Insufficient documentation

## 2020-04-16 DIAGNOSIS — Z801 Family history of malignant neoplasm of trachea, bronchus and lung: Secondary | ICD-10-CM | POA: Diagnosis not present

## 2020-04-16 DIAGNOSIS — Z79890 Hormone replacement therapy (postmenopausal): Secondary | ICD-10-CM

## 2020-04-16 DIAGNOSIS — Z79899 Other long term (current) drug therapy: Secondary | ICD-10-CM | POA: Insufficient documentation

## 2020-04-16 DIAGNOSIS — D696 Thrombocytopenia, unspecified: Secondary | ICD-10-CM

## 2020-04-16 DIAGNOSIS — D61818 Other pancytopenia: Secondary | ICD-10-CM | POA: Diagnosis not present

## 2020-04-16 DIAGNOSIS — K219 Gastro-esophageal reflux disease without esophagitis: Secondary | ICD-10-CM | POA: Diagnosis not present

## 2020-04-16 DIAGNOSIS — E039 Hypothyroidism, unspecified: Secondary | ICD-10-CM | POA: Insufficient documentation

## 2020-04-16 DIAGNOSIS — Z836 Family history of other diseases of the respiratory system: Secondary | ICD-10-CM | POA: Insufficient documentation

## 2020-04-16 DIAGNOSIS — Z85820 Personal history of malignant melanoma of skin: Secondary | ICD-10-CM | POA: Insufficient documentation

## 2020-04-16 DIAGNOSIS — D649 Anemia, unspecified: Secondary | ICD-10-CM

## 2020-04-16 DIAGNOSIS — F319 Bipolar disorder, unspecified: Secondary | ICD-10-CM | POA: Diagnosis not present

## 2020-04-16 DIAGNOSIS — Z823 Family history of stroke: Secondary | ICD-10-CM

## 2020-04-16 LAB — LACTATE DEHYDROGENASE: LDH: 164 U/L (ref 98–192)

## 2020-04-16 LAB — CMP (CANCER CENTER ONLY)
ALT: 18 U/L (ref 0–44)
AST: 24 U/L (ref 15–41)
Albumin: 4.1 g/dL (ref 3.5–5.0)
Alkaline Phosphatase: 56 U/L (ref 38–126)
Anion gap: 10 (ref 5–15)
BUN: 12 mg/dL (ref 8–23)
CO2: 27 mmol/L (ref 22–32)
Calcium: 9.2 mg/dL (ref 8.9–10.3)
Chloride: 104 mmol/L (ref 98–111)
Creatinine: 1.2 mg/dL (ref 0.61–1.24)
GFR, Estimated: >60 mL/min (ref >60)
Glucose, Bld: 88 mg/dL (ref 70–99)
Potassium: 4.3 mmol/L (ref 3.5–5.1)
Sodium: 141 mmol/L (ref 135–145)
Total Bilirubin: 0.6 mg/dL (ref 0.3–1.2)
Total Protein: 6.3 g/dL — ABNORMAL LOW (ref 6.5–8.1)

## 2020-04-16 LAB — CBC WITH DIFFERENTIAL (CANCER CENTER ONLY)
Abs Immature Granulocytes: 0.02 10*3/uL (ref 0.00–0.07)
Basophils Absolute: 0 10*3/uL (ref 0.0–0.1)
Basophils Relative: 1 %
Eosinophils Absolute: 0.1 10*3/uL (ref 0.0–0.5)
Eosinophils Relative: 2 %
HCT: 37.3 % — ABNORMAL LOW (ref 39.0–52.0)
Hemoglobin: 13.2 g/dL (ref 13.0–17.0)
Immature Granulocytes: 1 %
Lymphocytes Relative: 36 %
Lymphs Abs: 1.6 10*3/uL (ref 0.7–4.0)
MCH: 35.4 pg — ABNORMAL HIGH (ref 26.0–34.0)
MCHC: 35.4 g/dL (ref 30.0–36.0)
MCV: 100 fL (ref 80.0–100.0)
Monocytes Absolute: 0.3 10*3/uL (ref 0.1–1.0)
Monocytes Relative: 7 %
Neutro Abs: 2.4 10*3/uL (ref 1.7–7.7)
Neutrophils Relative %: 53 %
Platelet Count: 159 10*3/uL (ref 150–400)
RBC: 3.73 MIL/uL — ABNORMAL LOW (ref 4.22–5.81)
RDW: 13 % (ref 11.5–15.5)
WBC Count: 4.4 10*3/uL (ref 4.0–10.5)
nRBC: 0 % (ref 0.0–0.2)

## 2020-04-16 LAB — SAVE SMEAR(SSMR), FOR PROVIDER SLIDE REVIEW

## 2020-04-16 NOTE — Progress Notes (Signed)
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  His WBC, Hgb and platelet counts today are within normal limits.  He denies fatigue.  He has history of alcohol abuse (Vodka). He was sober for 18 years before relapsing in 2008. He states that since then he has struggled. He is in [place] and has the support of his family and sponsor. He is really working hard to overcome this and that is truly something to be proud of.  He has history of stage 1 melanoma of the mid chest and stomach. He states that these were excised and he did not require any further treatment. He follows up with his dermatologist Okray Lacy Martinique, MD)once a year and does regular self skin checks at home.  No family history of cancer.  No diabetes.  He is on synthroid for hypothyroidism.  No fever, chills, n/v, cough, rash, dizziness, SOB, chest pain, palpitations, abdominal pain or changes in bowel or bladder habits. Since starting Metoprolol he rarely experiences dizziness or chest pressure. He is collowed by cardiologist Dr, Danelle Earthly.  No episodes of bleeding. No bruising, no petechiae.  No swelling, tenderness, numbness or tingling in his extremities.  No falls or syncope.  He has maintained a good appetite and is staying well hydrated. His weight is stable.  No smoking or recreational drug use.  He is retired most recently from [employer] for [employer].  HE walks 2 miles 3 days a week.   ROS: All other 10 point review of systems is negative.   PAST MEDICAL HISTORY:   Past Medical History:  Diagnosis Date  . Alcoholism in remission ([place])    in [place]  .  Arrhythmia    atrial tachycardia  . Bipolar affective disorder ([place])   . Diverticulosis of colon (without mention of hemorrhage)   . GERD (gastroesophageal reflux disease)   . Gout of multiple sites   . Hiatal hernia   . Hx of migraines   . Hydrocele   . HYPERLIPIDEMIA 01/10/2009   Qualifier: Diagnosis of  By: Ronnald Ramp CMA, [place]    NMR Lipoprofile 2011 LDL could not be calculated  Due to TG of 889  (811  / 750 ),  HDL 50 . Father MI @ 97 PGM CVA early 68s; PGF CVA @72    . Hypothyroidism   . Melanoma of skin, site unspecified 11/25/2012   07/2011 abdominal  Stage 1 Dr Amy Martinique Dr Sarajane Jews   . Osteopenia   . Polyarthralgia     ALLERGIES: Allergies  Allergen Reactions  . Omnipaque [Iohexol] Hives     Desc: developed hives after injection; resolved with 50 mg benadryl given to pt.       MEDICATIONS:  Current Outpatient Medications on File Prior to Visit  Medication Sig Dispense Refill  . allopurinol (ZYLOPRIM) 300 MG tablet Take 1 tablet (300 mg total) by mouth daily. 90 tablet 3  . aspirin 81 MG tablet Take 81 mg by mouth daily.    Marland Kitchen atorvastatin (LIPITOR) 20 MG tablet TAKE 1 TABLET(20 MG) BY MOUTH AT BEDTIME 90 tablet 1  . b complex vitamins capsule Take 1 capsule  by mouth daily.    . Cholecalciferol (VITAMIN D) 50 MCG (2000 UT) tablet Take 2,000 Units by mouth daily.    Marland Kitchen esomeprazole (NEXIUM) 40 MG capsule Take 1 capsule (40 mg total) by mouth daily before breakfast. 90 capsule 3  . indomethacin (INDOCIN) 25 MG capsule Take 25 mg by mouth every 12 (twelve) hours as needed (back pain).   2  . lamoTRIgine (LAMICTAL) 25 MG tablet Take 25 mg by mouth daily.    Marland Kitchen levothyroxine (SYNTHROID) 50 MCG tablet Take 1 tablet (50 mcg total) by mouth daily before breakfast. 90 tablet 0  . Multiple Vitamin (MULTIVITAMIN WITH MINERALS) TABS tablet Take 1 tablet by mouth daily.    . naltrexone (DEPADE) 50 MG tablet Take 50 mg by mouth daily.   12  . QUEtiapine (SEROQUEL) 400 MG tablet Take 800 mg  by mouth at bedtime.     . tamsulosin (FLOMAX) 0.4 MG CAPS capsule Take 1 capsule (0.4 mg total) by mouth daily after supper. 30 capsule 3  . timolol (TIMOPTIC) 0.5 % ophthalmic solution 1 drop 2 (two) times daily.    . vitamin B-12 (CYANOCOBALAMIN) 500 MCG tablet Take 500 mcg by mouth daily.    . metoprolol succinate (TOPROL-XL) 100 MG 24 hr tablet Take 1 tablet (100 mg total) by mouth daily. Take with or immediately following a meal. 90 tablet 3   No current facility-administered medications on file prior to visit.     PAST SURGICAL HISTORY Past Surgical History:  Procedure Laterality Date  . BILIARY BRUSHING  12/07/2018   Procedure: BILIARY BRUSHING;  Surgeon: Rush Landmark Telford Nab., MD;  Location: [facility];  Service: Gastroenterology;;  . BILIARY DILATION  12/07/2018   Procedure: BILIARY DILATION;  Surgeon: Irving Copas., MD;  Location: [facility];  Service: Gastroenterology;;  . BIOPSY  12/07/2018   Procedure: BIOPSY;  Surgeon: Irving Copas., MD;  [facility];  Service: Gastroenterology;;  . COLONOSCOPY  2003   negative ; Dr Sharlett Iles  . ERCP N/A 12/07/2018   Procedure: ENDOSCOPIC RETROGRADE CHOLANGIOPANCREATOGRAPHY (ERCP);  Surgeon: Irving Copas., MD;  Location: [facility];  Service: Gastroenterology;  Laterality: N/A;  . ESOPHAGOGASTRODUODENOSCOPY (EGD) WITH PROPOFOL N/A 12/07/2018   Procedure: ESOPHAGOGASTRODUODENOSCOPY (EGD) WITH PROPOFOL;  Surgeon: Rush Landmark Telford Nab., MD;  [facility];  Service: Gastroenterology;  Laterality: N/A;  . EUS N/A 12/07/2018   Procedure: UPPER ENDOSCOPIC ULTRASOUND (EUS) RADIAL;  Surgeon: Rush Landmark Telford Nab., MD;  [facility];  Service: Gastroenterology;  Laterality: N/A;  . INGUINAL HERNIA REPAIR  08/07/2011   Procedure: HERNIA REPAIR INGUINAL ADULT;  Surgeon: Rolm Bookbinder, MD;  Location: [place];  Service: General;  Laterality: Right;  . MELANOMA  EXCISION  07/28/11   Dr Sarajane Jews; stomach and chest  . REMOVAL OF STONES  12/07/2018   Procedure: REMOVAL OF STONES;  Surgeon: Rush Landmark Telford Nab., MD;  Location: [facility];  Service: Gastroenterology;;  . [name]  1953  . 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 UPPER GASTROINTESTINAL ENDOSCOPY  1983   hiatal hernia  . VASECTOMY  1979    FAMILY HISTORY: Family History  Problem Relation Age of Onset  . COPD Father   . Heart attack Father 75  . COPD Mother   . Diabetes Paternal Grandmother   . Parkinsonism Paternal Grandmother   . Stroke Paternal Grandmother 5  . Stroke Maternal Grandfather 72  . Lung cancer Paternal Grandfather  Heavy smoker  . Colon cancer Neg Hx   . Prostate cancer Neg Hx     SOCIAL HISTORY:  reports that he has never smoked. He has never used smokeless tobacco. He reports previous alcohol use. He reports that he does not use drugs.  PERFORMANCE STATUS: The patient's performance status is 1 - Symptomatic but completely ambulatory  PHYSICAL EXAM: Most Recent Vital Signs: Blood pressure (!) 161/78, weight 203 lb (92.1 kg). BP (!) 161/78 (BP Location: Left Arm, Patient Position: Sitting)   Wt 203 lb (92.1 kg)   BMI 26.78 kg/m   General Appearance:    Alert, cooperative, no distress, appears stated age  Head:    Normocephalic, without obvious abnormality, atraumatic  Eyes:    PERRL, conjunctiva/corneas clear, EOM's intact, fundi    benign, both eyes        Throat:   Lips, mucosa, and tongue normal; teeth and gums normal  Neck:   Supple, symmetrical, trachea midline, no adenopathy;    thyroid:  no enlargement/tenderness/nodules; no carotid   bruit or JVD  Back:     Symmetric, no curvature, ROM normal, no CVA tenderness  Lungs:     Clear to auscultation bilaterally, respirations unlabored  Chest Wall:    No tenderness or deformity   Heart:    Regular rate and rhythm, S1 and S2 normal, no murmur, rub   or gallop      Abdomen:     Soft, non-tender, bowel sounds active all four quadrants,    no masses, no organomegaly        Extremities:   Extremities normal, atraumatic, no cyanosis or edema  Pulses:   2+ and symmetric all extremities  Skin:   Skin color, texture, turgor normal, no rashes or lesions  Lymph nodes:   Cervical, supraclavicular, and axillary nodes normal  Neurologic:   CNII-XII intact, normal strength, sensation and reflexes    throughout    LABORATORY DATA:  Results for orders placed or performed in visit on 04/16/20 (from the past 48 hour(s))  CBC with Differential (Cancer Center Only)     Status: Abnormal   Collection Time: 04/16/20  1:21 PM  Result Value Ref Range   WBC Count 4.4 4.0 - 10.5 K/uL   RBC 3.73 (L) 4.22 - 5.81 MIL/uL   Hemoglobin 13.2 13.0 - 17.0 g/dL   HCT 37.3 (L) 39.0 - 52.0 %   MCV 100.0 80.0 - 100.0 fL   MCH 35.4 (H) 26.0 - 34.0 pg   MCHC 35.4 30.0 - 36.0 g/dL   RDW 13.0 11.5 - 15.5 %   Platelet Count 159 150 - 400 K/uL   nRBC 0.0 0.0 - 0.2 %   Neutrophils Relative % 53 %   Neutro Abs 2.4 1.7 - 7.7 K/uL   Lymphocytes Relative 36 %   Lymphs Abs 1.6 0.7 - 4.0 K/uL   Monocytes Relative 7 %   Monocytes Absolute 0.3 0.1 - 1.0 K/uL   Eosinophils Relative 2 %   Eosinophils Absolute 0.1 0.0 - 0.5 K/uL   Basophils Relative 1 %   Basophils Absolute 0.0 0.0 - 0.1 K/uL   Immature Granulocytes 1 %   Abs Immature Granulocytes 0.02 0.00 - 0.07 K/uL    Comment: Performed at [name] Lab at [facility], [address], [place], [place] [address]  CMP ([place] only)     Status: Abnormal   Collection Time: 04/16/20  1:21 PM  Result Value Ref  Range   Sodium 141 135 - 145 mmol/L   Potassium 4.3 3.5 - 5.1 mmol/L   Chloride 104 98 - 111 mmol/L   CO2 27 22 - 32 mmol/L   Glucose, Bld 88 70 - 99 mg/dL    Comment: Glucose reference range applies only to samples taken after fasting for at least 8 hours.   BUN 12 8 - 23 mg/dL   Creatinine  1.20 0.61 - 1.24 mg/dL   Calcium 9.2 8.9 - 10.3 mg/dL   Total Protein 6.3 (L) 6.5 - 8.1 g/dL   Albumin 4.1 3.5 - 5.0 g/dL   AST 24 15 - 41 U/L   ALT 18 0 - 44 U/L   Alkaline Phosphatase 56 38 - 126 U/L   Total Bilirubin 0.6 0.3 - 1.2 mg/dL   GFR, Estimated >60 >60 mL/min    Comment: (NOTE) Calculated using the CKD-EPI Creatinine Equation (2021)    Anion gap 10 5 - 15    Comment: Performed at [facility] Lab at [facility], [address] [place], [place] [address]  Save Smear [facility])     Status: None   Collection Time: 04/16/20  1:21 PM  Result Value Ref Range   Smear Review SMEAR STAINED AND AVAILABLE FOR REVIEW     Comment: Performed at [facility] Lab at [facility], [address] [place], [name] [address]  Lactate dehydrogenase (LDH)     Status: None   Collection Time: 04/16/20  1:22 PM  Result Value Ref Range   LDH 164 98 - 192 U/L    Comment: Performed at [facility] Lab at [facility], [address], [place], [place] [address]      RADIOGRAPHY: No results found.     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  Dr. Marin Olp was able to review his blood smear. No abnormality of evidence of malignancy noted.  His counts at this time are within normal limits.  We discussed the effects that heavy alcohol consumption can have on his bone marrow and how continuing to eliminate this will help him tremendously in the long run. He verbalized understanding.  We will plan to see him again in another 6 months. If everything at that time remains stable.   All questions were answered and he is in agreement with the plan. He can contact our office with any questions or concerns. We can certainly see him sooner if needed.   The patient was discussed with Dr. Marin Olp and he is in agreement with the aforementioned.    Laverna Peace, NP

## 2020-04-17 ENCOUNTER — Telehealth: Payer: Self-pay | Admitting: Family

## 2020-04-17 NOTE — Telephone Encounter (Signed)
Patient has My Chart and will get appointment info from there per 12/21 los

## 2020-04-30 ENCOUNTER — Encounter (HOSPITAL_COMMUNITY): Payer: Self-pay | Admitting: Emergency Medicine

## 2020-04-30 ENCOUNTER — Inpatient Hospital Stay (HOSPITAL_COMMUNITY)
Admission: EM | Admit: 2020-04-30 | Discharge: 2020-05-16 | DRG: 064 | Disposition: A | Discharge status: Home Health | Payer: Medicare Other | Attending: Internal Medicine | Admitting: Internal Medicine

## 2020-04-30 ENCOUNTER — Emergency Department (HOSPITAL_COMMUNITY): Payer: Medicare Other

## 2020-04-30 ENCOUNTER — Other Ambulatory Visit: Payer: Self-pay

## 2020-04-30 DIAGNOSIS — G928 Other toxic encephalopathy: Secondary | ICD-10-CM | POA: Diagnosis present

## 2020-04-30 DIAGNOSIS — I70201 Unspecified atherosclerosis of native arteries of extremities, right leg: Secondary | ICD-10-CM | POA: Diagnosis not present

## 2020-04-30 DIAGNOSIS — F319 Bipolar disorder, unspecified: Secondary | ICD-10-CM | POA: Diagnosis present

## 2020-04-30 DIAGNOSIS — X31XXXA Exposure to excessive natural cold, initial encounter: Secondary | ICD-10-CM

## 2020-04-30 DIAGNOSIS — S066X0A Traumatic subarachnoid hemorrhage without loss of consciousness, initial encounter: Secondary | ICD-10-CM | POA: Diagnosis not present

## 2020-04-30 DIAGNOSIS — I428 Other cardiomyopathies: Secondary | ICD-10-CM | POA: Diagnosis not present

## 2020-04-30 DIAGNOSIS — N400 Enlarged prostate without lower urinary tract symptoms: Secondary | ICD-10-CM | POA: Diagnosis present

## 2020-04-30 DIAGNOSIS — I615 Nontraumatic intracerebral hemorrhage, intraventricular: Secondary | ICD-10-CM | POA: Diagnosis present

## 2020-04-30 DIAGNOSIS — Z20822 Contact with and (suspected) exposure to covid-19: Secondary | ICD-10-CM | POA: Diagnosis present

## 2020-04-30 DIAGNOSIS — W19XXXA Unspecified fall, initial encounter: Secondary | ICD-10-CM | POA: Diagnosis not present

## 2020-04-30 DIAGNOSIS — M79641 Pain in right hand: Secondary | ICD-10-CM | POA: Diagnosis not present

## 2020-04-30 DIAGNOSIS — Y908 Blood alcohol level of 240 mg/100 ml or more: Secondary | ICD-10-CM | POA: Diagnosis present

## 2020-04-30 DIAGNOSIS — Z781 Physical restraint status: Secondary | ICD-10-CM | POA: Diagnosis not present

## 2020-04-30 DIAGNOSIS — R4701 Aphasia: Secondary | ICD-10-CM | POA: Diagnosis not present

## 2020-04-30 DIAGNOSIS — E871 Hypo-osmolality and hyponatremia: Secondary | ICD-10-CM | POA: Diagnosis not present

## 2020-04-30 DIAGNOSIS — E785 Hyperlipidemia, unspecified: Secondary | ICD-10-CM | POA: Diagnosis present

## 2020-04-30 DIAGNOSIS — E872 Acidosis: Secondary | ICD-10-CM | POA: Diagnosis not present

## 2020-04-30 DIAGNOSIS — R32 Unspecified urinary incontinence: Secondary | ICD-10-CM | POA: Diagnosis not present

## 2020-04-30 DIAGNOSIS — M109 Gout, unspecified: Secondary | ICD-10-CM | POA: Diagnosis present

## 2020-04-30 DIAGNOSIS — A419 Sepsis, unspecified organism: Secondary | ICD-10-CM | POA: Diagnosis not present

## 2020-04-30 DIAGNOSIS — S06360A Traumatic hemorrhage of cerebrum, unspecified, without loss of consciousness, initial encounter: Secondary | ICD-10-CM | POA: Diagnosis not present

## 2020-04-30 DIAGNOSIS — R4182 Altered mental status, unspecified: Secondary | ICD-10-CM | POA: Diagnosis not present

## 2020-04-30 DIAGNOSIS — E039 Hypothyroidism, unspecified: Secondary | ICD-10-CM | POA: Diagnosis present

## 2020-04-30 DIAGNOSIS — M47812 Spondylosis without myelopathy or radiculopathy, cervical region: Secondary | ICD-10-CM | POA: Diagnosis not present

## 2020-04-30 DIAGNOSIS — I609 Nontraumatic subarachnoid hemorrhage, unspecified: Principal | ICD-10-CM | POA: Diagnosis present

## 2020-04-30 DIAGNOSIS — S199XXA Unspecified injury of neck, initial encounter: Secondary | ICD-10-CM | POA: Diagnosis not present

## 2020-04-30 DIAGNOSIS — I6200 Nontraumatic subdural hemorrhage, unspecified: Secondary | ICD-10-CM | POA: Diagnosis not present

## 2020-04-30 DIAGNOSIS — Z91041 Radiographic dye allergy status: Secondary | ICD-10-CM | POA: Diagnosis not present

## 2020-04-30 DIAGNOSIS — Z85820 Personal history of malignant melanoma of skin: Secondary | ICD-10-CM | POA: Diagnosis not present

## 2020-04-30 DIAGNOSIS — Z79890 Hormone replacement therapy (postmenopausal): Secondary | ICD-10-CM

## 2020-04-30 DIAGNOSIS — Z7982 Long term (current) use of aspirin: Secondary | ICD-10-CM

## 2020-04-30 DIAGNOSIS — D6959 Other secondary thrombocytopenia: Secondary | ICD-10-CM | POA: Diagnosis present

## 2020-04-30 DIAGNOSIS — R404 Transient alteration of awareness: Secondary | ICD-10-CM | POA: Diagnosis not present

## 2020-04-30 DIAGNOSIS — Z0189 Encounter for other specified special examinations: Secondary | ICD-10-CM

## 2020-04-30 DIAGNOSIS — F1010 Alcohol abuse, uncomplicated: Secondary | ICD-10-CM | POA: Diagnosis present

## 2020-04-30 DIAGNOSIS — M8580 Other specified disorders of bone density and structure, unspecified site: Secondary | ICD-10-CM | POA: Diagnosis present

## 2020-04-30 DIAGNOSIS — K219 Gastro-esophageal reflux disease without esophagitis: Secondary | ICD-10-CM | POA: Diagnosis present

## 2020-04-30 DIAGNOSIS — N179 Acute kidney failure, unspecified: Secondary | ICD-10-CM | POA: Diagnosis not present

## 2020-04-30 DIAGNOSIS — T68XXXA Hypothermia, initial encounter: Secondary | ICD-10-CM | POA: Diagnosis not present

## 2020-04-30 DIAGNOSIS — I10 Essential (primary) hypertension: Secondary | ICD-10-CM | POA: Diagnosis not present

## 2020-04-30 DIAGNOSIS — Z4682 Encounter for fitting and adjustment of non-vascular catheter: Secondary | ICD-10-CM | POA: Diagnosis not present

## 2020-04-30 DIAGNOSIS — F10229 Alcohol dependence with intoxication, unspecified: Secondary | ICD-10-CM | POA: Diagnosis present

## 2020-04-30 DIAGNOSIS — I4891 Unspecified atrial fibrillation: Secondary | ICD-10-CM | POA: Diagnosis not present

## 2020-04-30 DIAGNOSIS — R519 Headache, unspecified: Secondary | ICD-10-CM | POA: Diagnosis not present

## 2020-04-30 DIAGNOSIS — I608 Other nontraumatic subarachnoid hemorrhage: Secondary | ICD-10-CM | POA: Diagnosis not present

## 2020-04-30 DIAGNOSIS — I618 Other nontraumatic intracerebral hemorrhage: Secondary | ICD-10-CM | POA: Diagnosis not present

## 2020-04-30 DIAGNOSIS — I639 Cerebral infarction, unspecified: Secondary | ICD-10-CM

## 2020-04-30 DIAGNOSIS — E876 Hypokalemia: Secondary | ICD-10-CM | POA: Diagnosis not present

## 2020-04-30 DIAGNOSIS — M47816 Spondylosis without myelopathy or radiculopathy, lumbar region: Secondary | ICD-10-CM | POA: Diagnosis not present

## 2020-04-30 DIAGNOSIS — I6523 Occlusion and stenosis of bilateral carotid arteries: Secondary | ICD-10-CM | POA: Diagnosis not present

## 2020-04-30 DIAGNOSIS — R531 Weakness: Secondary | ICD-10-CM | POA: Diagnosis not present

## 2020-04-30 DIAGNOSIS — F17210 Nicotine dependence, cigarettes, uncomplicated: Secondary | ICD-10-CM | POA: Diagnosis present

## 2020-04-30 DIAGNOSIS — K6389 Other specified diseases of intestine: Secondary | ICD-10-CM | POA: Diagnosis not present

## 2020-04-30 DIAGNOSIS — M25521 Pain in right elbow: Secondary | ICD-10-CM | POA: Diagnosis not present

## 2020-04-30 DIAGNOSIS — Z79899 Other long term (current) drug therapy: Secondary | ICD-10-CM

## 2020-04-30 DIAGNOSIS — H4902 Third [oculomotor] nerve palsy, left eye: Secondary | ICD-10-CM | POA: Diagnosis present

## 2020-04-30 DIAGNOSIS — R9431 Abnormal electrocardiogram [ECG] [EKG]: Secondary | ICD-10-CM | POA: Diagnosis not present

## 2020-04-30 DIAGNOSIS — I351 Nonrheumatic aortic (valve) insufficiency: Secondary | ICD-10-CM | POA: Diagnosis not present

## 2020-04-30 DIAGNOSIS — R55 Syncope and collapse: Secondary | ICD-10-CM | POA: Diagnosis not present

## 2020-04-30 DIAGNOSIS — Z4659 Encounter for fitting and adjustment of other gastrointestinal appliance and device: Secondary | ICD-10-CM

## 2020-04-30 LAB — COMPREHENSIVE METABOLIC PANEL
ALT: 15 U/L (ref 0–44)
AST: 29 U/L (ref 15–41)
Albumin: 3.6 g/dL (ref 3.5–5.0)
Alkaline Phosphatase: 51 U/L (ref 38–126)
Anion gap: 12 (ref 5–15)
BUN: 10 mg/dL (ref 8–23)
CO2: 23 mmol/L (ref 22–32)
Calcium: 9 mg/dL (ref 8.9–10.3)
Chloride: 106 mmol/L (ref 98–111)
Creatinine, Ser: 1.16 mg/dL (ref 0.61–1.24)
GFR, Estimated: >60 mL/min (ref >60)
Glucose, Bld: 93 mg/dL (ref 70–99)
Potassium: 3.8 mmol/L (ref 3.5–5.1)
Sodium: 141 mmol/L (ref 135–145)
Total Bilirubin: 0.9 mg/dL (ref 0.3–1.2)
Total Protein: 5.8 g/dL — ABNORMAL LOW (ref 6.5–8.1)

## 2020-04-30 LAB — ETHANOL: Alcohol, Ethyl (B): 328 mg/dL (ref <10)

## 2020-04-30 LAB — CBC
HCT: 36 % — ABNORMAL LOW (ref 39.0–52.0)
Hemoglobin: 12.9 g/dL — ABNORMAL LOW (ref 13.0–17.0)
MCH: 35.9 pg — ABNORMAL HIGH (ref 26.0–34.0)
MCHC: 35.8 g/dL (ref 30.0–36.0)
MCV: 100.3 fL — ABNORMAL HIGH (ref 80.0–100.0)
Platelets: 107 10*3/uL — ABNORMAL LOW (ref 150–400)
RBC: 3.59 MIL/uL — ABNORMAL LOW (ref 4.22–5.81)
RDW: 13.3 % (ref 11.5–15.5)
WBC: 4.6 10*3/uL (ref 4.0–10.5)
nRBC: 0 % (ref 0.0–0.2)

## 2020-04-30 MED ORDER — POLYETHYLENE GLYCOL 3350 17 G PO PACK: 17.0000 g | PACK | Freq: Every day | ORAL | Status: DC | PRN

## 2020-04-30 MED ORDER — MORPHINE SULFATE (PF) 2 MG/ML IV SOLN
1.0000 mg | INTRAVENOUS | Status: DC | PRN
  Administered 2020-05-01 – 2020-05-06 (×19): 2 mg via INTRAVENOUS
  Filled 2020-04-30 (×19): qty 1

## 2020-04-30 MED ORDER — FOLIC ACID 1 MG PO TABS
1.0000 mg | ORAL_TABLET | Freq: Every day | ORAL | Status: DC
  Administered 2020-05-02 – 2020-05-10 (×9): 1 mg via ORAL
  Filled 2020-04-30 (×10): qty 1

## 2020-04-30 MED ORDER — THIAMINE HCL 100 MG/ML IJ SOLN: 100.0000 mg | Freq: Every day | INTRAMUSCULAR | Status: DC

## 2020-04-30 MED ORDER — DIPHENHYDRAMINE HCL 50 MG/ML IJ SOLN
25.0000 mg | Freq: Once | INTRAMUSCULAR | Status: AC
  Administered 2020-04-30: 25 mg via INTRAVENOUS
  Filled 2020-04-30: qty 1

## 2020-04-30 MED ORDER — LEVETIRACETAM IN NACL 1000 MG/100ML IV SOLN
1000.0000 mg | Freq: Two times a day (BID) | INTRAVENOUS | Status: DC
  Administered 2020-05-01 – 2020-05-03 (×6): 1000 mg via INTRAVENOUS
  Filled 2020-04-30 (×6): qty 100

## 2020-04-30 MED ORDER — PANTOPRAZOLE SODIUM 40 MG IV SOLR
40.0000 mg | Freq: Every day | INTRAVENOUS | Status: DC
  Administered 2020-05-01 – 2020-05-02 (×2): 40 mg via INTRAVENOUS
  Filled 2020-04-30 (×2): qty 40

## 2020-04-30 MED ORDER — PHENOBARBITAL SODIUM 65 MG/ML IJ SOLN: 40.0000 mg | Freq: Four times a day (QID) | INTRAMUSCULAR | Status: DC

## 2020-04-30 MED ORDER — THIAMINE HCL 100 MG PO TABS
100.0000 mg | ORAL_TABLET | Freq: Every day | ORAL | Status: DC
  Administered 2020-05-01 – 2020-05-10 (×10): 100 mg via ORAL
  Filled 2020-04-30 (×11): qty 1

## 2020-04-30 MED ORDER — DEXAMETHASONE SODIUM PHOSPHATE 10 MG/ML IJ SOLN
10.0000 mg | Freq: Once | INTRAMUSCULAR | Status: AC
  Administered 2020-04-30: 10 mg via INTRAVENOUS
  Filled 2020-04-30: qty 1

## 2020-04-30 MED ORDER — LORAZEPAM 1 MG PO TABS
1.0000 mg | ORAL_TABLET | ORAL | Status: AC | PRN
  Administered 2020-05-01: 2 mg via ORAL
  Filled 2020-04-30: qty 1
  Filled 2020-04-30: qty 2

## 2020-04-30 MED ORDER — ADULT MULTIVITAMIN W/MINERALS CH
1.0000 | ORAL_TABLET | Freq: Every day | ORAL | Status: DC
  Administered 2020-05-02 – 2020-05-10 (×9): 1 via ORAL
  Filled 2020-04-30 (×10): qty 1

## 2020-04-30 MED ORDER — LORAZEPAM 2 MG/ML IJ SOLN
1.0000 mg | INTRAMUSCULAR | Status: AC | PRN
  Administered 2020-05-01 (×2): 2 mg via INTRAVENOUS
  Filled 2020-04-30 (×2): qty 1

## 2020-04-30 MED ORDER — DOCUSATE SODIUM 100 MG PO CAPS: 100.0000 mg | ORAL_CAPSULE | Freq: Two times a day (BID) | ORAL | Status: DC | PRN

## 2020-04-30 NOTE — H&P (Signed)
NAME:  Jeffrey Acosta, MRN:  [id number], DOB:  11/16/1950, LOS: 0 ADMISSION DATE:  04/30/2020, CONSULTATION DATE:  1/4 REFERRING MD:  Dr. Billy Fischer, CHIEF COMPLAINT:  SAH   Brief History:  70 year old male admitted 1/4 with SAH after collapsing at home with loss of consciousness. SAH described on imaging in ED. Admit ICU under PCCM.   History of Present Illness:  71 year old male with PMH as below, which is significant for alcohol abuse, atrial tachycardia, gout, hypothyroid, and Barrett's esophagus. He has been drinking recently, but had reportedly been sober for about 3-4 days when on 1/4 he collapsed into bathroom. Wife did not immediately witness the event, but did hear the thud of his fall and rushed to find him unconscious on the floor. She did not describe seizure like activity. EMS was called and transported him to [facility] ED where he was more alert and complaining of headache x 1 hour. He was subsequently sent for CT of the head, demonstrating subarachnoid hemorrhage. Neurosurgery was consulted by the ED physician and felt the patient was most appropriate for medical admission to ICU under PCCM.   Past Medical History:   has a past medical history of Alcoholism in remission ([place]), Arrhythmia, Bipolar affective disorder ([place]), Diverticulosis of colon (without mention of hemorrhage), GERD (gastroesophageal reflux disease), Gout of multiple sites, Hiatal hernia, migraines, Hydrocele, HYPERLIPIDEMIA (01/10/2009), Hypothyroidism, Melanoma of skin, site unspecified (11/25/2012), Osteopenia, and Polyarthralgia.   Significant Hospital Events:  1/4 admit  Consults:  Neurosurgery  Procedures:    Significant Diagnostic Tests:  CT head 1/4 > Moderate to large amount of acute subarachnoid /extra-axial hemorrhage, most heavily concentrated at the suprasellar cistern, and anterior to the brainstem. Findings felt consistent with ruptured aneurysm. Additional foci of extra-axial blood within the  posterior fossa, left greater than right middle cranial fossa, and anterior inter hemispheric region. Small amount of bilateral intraventricular and fourth ventricular hemorrhage. Suspected left anterior subdural hygroma as well.  CT C-spine 1/4 > multilevel cervical spondylosis. No acute fracture.  CTA head/neck 1/4 >  Micro Data:    Antimicrobials:     Interim History / Subjective:    Objective   Blood pressure 137/78, pulse 62, temperature (!) 97.4 F (36.3 C), temperature source Oral, resp. rate (!) 28, height 6\' 1"  (1.854 m), weight 95.3 kg, SpO2 96 %.       No intake or output data in the 24 hours ending 04/30/20 2256 Filed Weights   04/30/20 2021  Weight: 95.3 kg    Examination: General: elderly appearing male in NAD HENT: /AT, PERRL, no JVD Lungs: Clear Cardiovascular: RRR, no MRG Abdomen: Soft, non-tender, non-distended Extremities: No acute deformity. ROM 5/5 in all extremities.  Neuro: Alert, oriented, non-focal.     Resolved Hospital Problem list     Assessment & Plan:   Subarachnoid hemorrhage: Radiologist feels this is most consistent with aneurysmal rupture.  Subdural hygroma: left anterior.  - Admit to neuro ICU - Management per neurosurgery - CTA pending - Keep SBP < 140 mmHG, PRN hydralazine. May need infusion.  - nimodipine  - There has been some question of using phenobarb for sz ppx and ETOH withdrawal, I worry long half life has potential to interfere with ongoing neuro exam. Will start Keppra.  Alcohol abuse: Concern for withdrawal, as wife describes tremulousness prior to this event, however alcohol level 320 on admission. He is not tremulous on exam and he is not delirious or tachycardic.  -  CIWA protocol with ativan - Cessation counseling will be needed prior to discharge.   Thrombocytopenia: 107 on admission. This is a chronic issue, which has been worked up by hematology and alcoholism is felt to be a contributor.  - Platelets not low  enough to require transfusion - STAT PT/INR  Atrial tachycardia - Hold home metoprolol for now   Bipolar disorder - Hold Lamictal and Seroquel briefly  Hypothyroidism - check TSH - Continue home sythroid  Hyperlipidemia - continue atorvastatin  GERD/ Barrett's esophagus  - PPI   Best practice (evaluated daily)  Diet: NPO pending surgery input pending imaging.  Pain/Anxiety/Delirium protocol (if indicated): Morphine PRN, CIWA ativan VAP protocol (if indicated): NA DVT prophylaxis: SCD GI prophylaxis: PPI Glucose control: NA Mobility: BR Disposition:ICU  Goals of Care:  Last date of multidisciplinary goals of care discussion: Family and staff present: Summary of discussion: 1/11 Follow up goals of care discussion due:  Code Status: Full code  Labs   CBC: Recent Labs  Lab 04/30/20 2038  WBC 4.6  HGB 12.9*  HCT 36.0*  MCV 100.3*  PLT 107*    Basic Metabolic Panel: Recent Labs  Lab 04/30/20 2038  NA 141  K 3.8  CL 106  CO2 23  GLUCOSE 93  BUN 10  CREATININE 1.16  CALCIUM 9.0   GFR: Estimated Creatinine Clearance: 66 mL/min (by C-G formula based on SCr of 1.16 mg/dL). Recent Labs  Lab 04/30/20 2038  WBC 4.6    Liver Function Tests: Recent Labs  Lab 04/30/20 2038  AST 29  ALT 15  ALKPHOS 51  BILITOT 0.9  PROT 5.8*  ALBUMIN 3.6   No results for input(s): LIPASE, AMYLASE in the last 168 hours. No results for input(s): AMMONIA in the last 168 hours.  ABG No results found for: PHART, PCO2ART, PO2ART, HCO3, TCO2, ACIDBASEDEF, O2SAT   Coagulation Profile: No results for input(s): INR, PROTIME in the last 168 hours.  Cardiac Enzymes: No results for input(s): CKTOTAL, CKMB, CKMBINDEX, TROPONINI in the last 168 hours.  HbA1C: Hgb A1c MFr Bld  Date/Time Value Ref Range Status  01/21/2017 09:13 AM 4.3 (L) 4.6 - 6.5 % Final    Comment:    Glycemic Control Guidelines for People with Diabetes:Non Diabetic:  <6%Goal of Therapy: <7%Additional  Action Suggested:  >8%   09/11/2014 03:05 PM 4.7 4.6 - 6.5 % Final    Comment:    Glycemic Control Guidelines for People with Diabetes:Non Diabetic:  <6%Goal of Therapy: <7%Additional Action Suggested:  >8%     CBG: No results for input(s): GLUCAP in the last 168 hours.  Review of Systems:   Bolds are positive  Constitutional: weight loss, gain, night sweats, Fevers, chills, fatigue .  HEENT: headaches, Sore throat, sneezing, nasal congestion, post nasal drip, Difficulty swallowing, Tooth/dental problems, visual complaints visual changes, ear ache CV:  chest pain, radiates:,Orthopnea, PND, swelling in lower extremities, dizziness, palpitations, syncope.  GI  heartburn, indigestion, abdominal pain, nausea, vomiting, diarrhea, change in bowel habits, loss of appetite, bloody stools.  Resp: cough, productive: , hemoptysis, dyspnea, chest pain, pleuritic.  Skin: rash or itching or icterus GU: dysuria, change in color of urine, urgency or frequency. flank pain, hematuria  MS: joint pain or swelling. decreased range of motion  Psych: change in mood or affect. depression or anxiety.  Neuro: difficulty with speech, weakness, numbness, ataxia    Past Medical History:  He,  has a past medical history of Alcoholism in remission [facility]), Arrhythmia, Bipolar  affective disorder ([place]), Diverticulosis of colon (without mention of hemorrhage), GERD (gastroesophageal reflux disease), Gout of multiple sites, Hiatal hernia, migraines, Hydrocele, HYPERLIPIDEMIA (01/10/2009), Hypothyroidism, Melanoma of skin, site unspecified (11/25/2012), Osteopenia, and Polyarthralgia.   Surgical History:   Past Surgical History:  Procedure Laterality Date  . BILIARY BRUSHING  12/07/2018   Procedure: BILIARY BRUSHING;  Surgeon: Rush Landmark Telford Nab., MD;  Location: [facility];  Service: Gastroenterology;;  . BILIARY DILATION  12/07/2018   Procedure: BILIARY DILATION;  Surgeon: Irving Copas., MD;  [facility];  Service: Gastroenterology;;  . BIOPSY  12/07/2018   Procedure: BIOPSY;  Surgeon: Irving Copas., MD;  [facility];  Service: Gastroenterology;;  . COLONOSCOPY  2003   negative ; Dr Sharlett Iles  . ERCP N/A 12/07/2018   Procedure: ENDOSCOPIC RETROGRADE CHOLANGIOPANCREATOGRAPHY (ERCP);  Surgeon: Irving Copas., MD;  Location: [facility];  Service: Gastroenterology;  Laterality: N/A;  . ESOPHAGOGASTRODUODENOSCOPY (EGD) WITH PROPOFOL N/A 12/07/2018   Procedure: ESOPHAGOGASTRODUODENOSCOPY (EGD) WITH PROPOFOL;  Surgeon: Rush Landmark Telford Nab., MD;  [facility];  Service: Gastroenterology;  Laterality: N/A;  . EUS N/A 12/07/2018   Procedure: UPPER ENDOSCOPIC ULTRASOUND (EUS) RADIAL;  Surgeon: Rush Landmark Telford Nab., MD;  [facility];  Service: Gastroenterology;  Laterality: N/A;  . INGUINAL HERNIA REPAIR  08/07/2011   Procedure: HERNIA REPAIR INGUINAL ADULT;  Surgeon: Rolm Bookbinder, MD;  Location: [place];  Service: General;  Laterality: Right;  . MELANOMA EXCISION  07/28/11   Dr Sarajane Jews; stomach and chest  . REMOVAL OF STONES  12/07/2018   Procedure: REMOVAL OF STONES;  Surgeon: Rush Landmark Telford Nab., MD;  Location: [facility];  Service: Gastroenterology;;  . [name]  1953  . 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 UPPER GASTROINTESTINAL ENDOSCOPY  1983   hiatal hernia  . VASECTOMY  1979     Social History:   reports that he has never smoked. He has never used smokeless tobacco. He reports previous alcohol use. He reports that he does not use drugs.   Family History:  His family history includes COPD in his father and mother; Diabetes in his paternal grandmother; Heart attack (age of onset: 39) in his father; Lung cancer in his paternal grandfather; Parkinsonism in his paternal grandmother; Stroke (age of onset: 84) in his paternal grandmother; Stroke (age of onset: 25) in his maternal  grandfather. There is no history of Colon cancer or Prostate cancer.   Allergies Allergies  Allergen Reactions  . Omnipaque [Iohexol] Hives     Desc: developed hives after injection; resolved with 50 mg benadryl given to pt.      Home Medications  Prior to Admission medications   Medication Sig Start Date End Date Taking? Authorizing Provider  allopurinol (ZYLOPRIM) 300 MG tablet Take 1 tablet (300 mg total) by mouth daily. 08/01/19   Colon Branch, MD  aspirin 81 MG tablet Take 81 mg by mouth daily.    [provider]  atorvastatin (LIPITOR) 20 MG tablet TAKE 1 TABLET(20 MG) BY MOUTH AT BEDTIME 11/10/19   Colon Branch, MD  b complex vitamins capsule Take 1 capsule by mouth daily.    [provider]  Cholecalciferol (VITAMIN D) 50 MCG (2000 UT) tablet Take 2,000 Units by mouth daily.    [provider]  esomeprazole (NEXIUM) 40 MG capsule Take 1 capsule (40 mg total) by mouth daily before breakfast. 01/11/20   Colon Branch, MD  indomethacin (INDOCIN) 25 MG  capsule Take 25 mg by mouth every 12 (twelve) hours as needed (back pain).  06/10/16   [provider]  lamoTRIgine (LAMICTAL) 25 MG tablet Take 25 mg by mouth daily. 12/30/19   [provider]  levothyroxine (SYNTHROID) 50 MCG tablet Take 1 tablet (50 mcg total) by mouth daily before breakfast. 01/11/20   Wanda Plump, MD  metoprolol succinate (TOPROL-XL) 100 MG 24 hr tablet Take 1 tablet (100 mg total) by mouth daily. Take with or immediately following a meal. 01/08/20 04/07/20  O'Neal, Ronnald Ramp, MD  Multiple Vitamin (MULTIVITAMIN WITH MINERALS) TABS tablet Take 1 tablet by mouth daily.    [provider]  naltrexone (DEPADE) 50 MG tablet Take 50 mg by mouth daily.  01/15/16   [provider]  QUEtiapine (SEROQUEL) 400 MG tablet Take 800 mg by mouth at bedtime.     [provider]  tamsulosin (FLOMAX) 0.4 MG CAPS capsule Take 1 capsule (0.4 mg total) by mouth daily after  supper. 10/12/16   Wanda Plump, MD  timolol (TIMOPTIC) 0.5 % ophthalmic solution 1 drop 2 (two) times daily. 03/17/19   [provider]  vitamin B-12 (CYANOCOBALAMIN) 500 MCG tablet Take 500 mcg by mouth daily.    [provider]     Critical care time: 36 minutes      Joneen Roach, AGACNP-BC [facility]  See Amion for personal pager PCCM on call pager [phone]  05/01/2020 12:03 AM

## 2020-04-30 NOTE — ED Provider Notes (Signed)
[facility] EMERGENCY DEPARTMENT Provider Note   CSN: [id number] Arrival date & time: 04/30/20  2014     History Chief Complaint  Patient presents with  . Loss of Consciousness  . Alcohol Intoxication    Jeffrey Acosta is a 70 y.o. male.  HPI      70yo male with history of alcoholism, hyperlipidemia, presents with concern for syncope and headache.    Wife had just come into the house, heard a BAM and looked and found him with legs and feet in bathroom and head into the hall, went to him and he was unconscious for several minutes. No seizure like activity.  History of etoh abuse, has not had drink in 3-4 days per wife, hands were shaking a little bit today  Patient alert but somewhat slow to respond, reports last drink yesterday AM, history of etoh withdrawal Reports headache that began about one hour ago, severe 10/10, felt it began slowly, never had headache like this before. Associated nausea. No numbness, weakness, visual changes. No blood thinners with exception of 81mg  ASA.  He does not recall syncopal episode or fall.   Past Medical History:  Diagnosis Date  . Alcoholism in remission ([place])    in [place]  . Arrhythmia    atrial tachycardia  . Bipolar affective disorder ([place])   . Diverticulosis of colon (without mention of hemorrhage)   . GERD (gastroesophageal reflux disease)   . Gout of multiple sites   . Hiatal hernia   . Hx of migraines   . Hydrocele   . HYPERLIPIDEMIA 01/10/2009   Qualifier: Diagnosis of  By: Ronnald Ramp CMA, [place]    NMR Lipoprofile 2011 LDL could not be calculated  Due to TG of 889  (811  / 750 ),  HDL 50 . Father MI @ 63 PGM CVA early 65s; PGF CVA @72    . Hypothyroidism   . Melanoma of skin, site unspecified 11/25/2012   07/2011 abdominal  Stage 1 Dr Amy [place] Dr Sarajane Jews   . Osteopenia   . Polyarthralgia     Patient Active Problem List   Diagnosis Date Noted  . SAH (subarachnoid hemorrhage) ([place]) 04/30/2020  . Leukopenia  07/15/2018  . Thrombocytopenia ([place]) 07/15/2018  . PCP NOTES >>>>>>> 03/23/2015  . Annual physical exam 03/20/2015  . Bronchospasm 09/06/2014  . DJD (degenerative joint disease) 09/06/2014  . Hypothyroidism 08/28/2014  . Tremor 07/29/2014  . Gait disorder 07/29/2014  . Gout 01/08/2014  . Vitamin D deficiency 11/25/2012  . Melanoma of skin, site unspecified 11/25/2012  . 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 Alcoholism in remission ([place]) 06/19/2011  . Diverticulitis of colon (without mention of hemorrhage)(562.11) 06/19/2011  . Bipolar I disorder ([place]) 06/19/2011  . Osteopenia 05/17/2010  . Alcohol abuse 05/16/2010  . HYPERTRIGLYCERIDEMIA 08/02/2009  . Macrocytic anemia 01/10/2009  . ABNORMAL ELECTROCARDIOGRAM 11/26/2008  . REFLUX, ESOPHAGEAL 11/17/2006  . COMMON MIGRAINE 09/22/2006    Past Surgical History:  Procedure Laterality Date  . BILIARY BRUSHING  12/07/2018   Procedure: BILIARY BRUSHING;  Surgeon: Rush Landmark Telford Nab., MD;  Location: [facility];  Service: Gastroenterology;;  . BILIARY DILATION  12/07/2018   Procedure: BILIARY DILATION;  Surgeon: Irving Copas., MD;  Location: [facility];  Service: Gastroenterology;;  . BIOPSY  12/07/2018   Procedure: BIOPSY;  Surgeon: Irving Copas., MD;  Location: [facility];  Service: Gastroenterology;;  . COLONOSCOPY  2003   negative ; Dr Sharlett Iles  . ERCP N/A 12/07/2018  Procedure: ENDOSCOPIC RETROGRADE CHOLANGIOPANCREATOGRAPHY (ERCP);  Surgeon: Irving Copas., MD;  Location: [facility];  Service: Gastroenterology;  Laterality: N/A;  . ESOPHAGOGASTRODUODENOSCOPY (EGD) WITH PROPOFOL N/A 12/07/2018   Procedure: ESOPHAGOGASTRODUODENOSCOPY (EGD) WITH PROPOFOL;  Surgeon: Rush Landmark Telford Nab., MD;  Location: [facility];  Service: Gastroenterology;  Laterality: N/A;  . EUS N/A 12/07/2018   Procedure: UPPER ENDOSCOPIC ULTRASOUND (EUS) RADIAL;  Surgeon: Rush Landmark Telford Nab., MD;  Location:  [facility];  Service: Gastroenterology;  Laterality: N/A;  . INGUINAL HERNIA REPAIR  08/07/2011   Procedure: HERNIA REPAIR INGUINAL ADULT;  Surgeon: Rolm Bookbinder, MD;  Location: [facility];  Service: General;  Laterality: Right;  . MELANOMA EXCISION  07/28/11   Dr Sarajane Jews; stomach and chest  . REMOVAL OF STONES  12/07/2018   Procedure: REMOVAL OF STONES;  Surgeon: Rush Landmark Telford Nab., MD;  Location: [facility];  Service: Gastroenterology;;  . [name]  1953  . 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 UPPER GASTROINTESTINAL ENDOSCOPY  1983   hiatal hernia  . VASECTOMY  1979       Family History  Problem Relation Age of Onset  . COPD Father   . Heart attack Father 25  . COPD Mother   . Diabetes Paternal Grandmother   . Parkinsonism Paternal Grandmother   . Stroke Paternal Grandmother 74  . Stroke Maternal Grandfather 72  . Lung cancer Paternal Grandfather        Heavy smoker  . Colon cancer Neg Hx   . Prostate cancer Neg Hx     Social History   Tobacco Use  . Smoking status: Never Smoker  . Smokeless tobacco: Never Used  Vaping Use  . Vaping Use: Never used  Substance Use Topics  . Alcohol use: Not Currently    Alcohol/week: 0.0 standard drinks    Comment: pt reports periodic alcohol  . Drug use: No    Home Medications Prior to Admission medications   Medication Sig Start Date End Date Taking? Authorizing Provider  allopurinol (ZYLOPRIM) 300 MG tablet Take 1 tablet (300 mg total) by mouth daily. 08/01/19   Colon Branch, MD  aspirin 81 MG tablet Take 81 mg by mouth daily.    [provider]  atorvastatin (LIPITOR) 20 MG tablet TAKE 1 TABLET(20 MG) BY MOUTH AT BEDTIME 11/10/19   Colon Branch, MD  b complex vitamins capsule Take 1 capsule by mouth daily.    [provider]  Cholecalciferol (VITAMIN D) 50 MCG (2000 UT) tablet Take 2,000 Units by mouth daily.    [provider]  esomeprazole (NEXIUM) 40 MG  capsule Take 1 capsule (40 mg total) by mouth daily before breakfast. 01/11/20   Colon Branch, MD  indomethacin (INDOCIN) 25 MG capsule Take 25 mg by mouth every 12 (twelve) hours as needed (back pain).  06/10/16   [provider]  lamoTRIgine (LAMICTAL) 25 MG tablet Take 25 mg by mouth daily. 12/30/19   [provider]  levothyroxine (SYNTHROID) 50 MCG tablet Take 1 tablet (50 mcg total) by mouth daily before breakfast. 01/11/20   Colon Branch, MD  metoprolol succinate (TOPROL-XL) 100 MG 24 hr tablet Take 1 tablet (100 mg total) by mouth daily. Take with or immediately following a meal. 01/08/20 04/07/20  O'Neal, Cassie Freer, MD  Multiple Vitamin (MULTIVITAMIN WITH MINERALS) TABS tablet Take 1 tablet by mouth daily.    [provider]  naltrexone (DEPADE) 50 MG tablet Take 50 mg by  mouth daily.  01/15/16   [provider]  QUEtiapine (SEROQUEL) 400 MG tablet Take 800 mg by mouth at bedtime.     [provider]  tamsulosin (FLOMAX) 0.4 MG CAPS capsule Take 1 capsule (0.4 mg total) by mouth daily after supper. 10/12/16   Wanda Plump, MD  timolol (TIMOPTIC) 0.5 % ophthalmic solution 1 drop 2 (two) times daily. 03/17/19   [provider]  vitamin B-12 (CYANOCOBALAMIN) 500 MCG tablet Take 500 mcg by mouth daily.    [provider]    Allergies    Omnipaque [iohexol]  Review of Systems   Review of Systems  Constitutional: Negative for fever.  Respiratory: Negative for cough.   Gastrointestinal: Positive for nausea. Negative for abdominal pain and vomiting.  Skin: Negative for rash.  Neurological: Positive for syncope and headaches. Negative for dizziness, facial asymmetry, weakness and numbness.    Physical Exam Updated Vital Signs BP 137/78   Pulse 62   Temp (!) 97.4 F (36.3 C) (Oral)   Resp (!) 28   Ht 6\' 1"  (1.854 m)   Wt 95.3 kg   SpO2 96%   BMI 27.71 kg/m   Physical Exam Vitals and nursing note reviewed.  Constitutional:       General: He is not in acute distress.    Appearance: He is well-developed and well-nourished. He is not diaphoretic.  HENT:     Head: Normocephalic and atraumatic.  Eyes:     Extraocular Movements: EOM normal.     Conjunctiva/sclera: Conjunctivae normal.  Cardiovascular:     Rate and Rhythm: Normal rate and regular rhythm.     Pulses: Intact distal pulses.     Heart sounds: Normal heart sounds. No murmur heard. No friction rub. No gallop.   Pulmonary:     Effort: Pulmonary effort is normal. No respiratory distress.     Breath sounds: Normal breath sounds. No wheezing or rales.  Abdominal:     General: There is no distension.     Palpations: Abdomen is soft.     Tenderness: There is no abdominal tenderness. There is no guarding.  Musculoskeletal:        General: No edema.     Cervical back: Normal range of motion.  Skin:    General: Skin is warm and dry.  Neurological:     Mental Status: He is alert and oriented to person, place, and time.     GCS: GCS eye subscore is 4. GCS verbal subscore is 5. GCS motor subscore is 6.     Cranial Nerves: Cranial nerves are intact. No dysarthria or facial asymmetry.     Sensory: Sensation is intact. No sensory deficit.     Motor: Motor function is intact. No weakness.     Coordination: Coordination is intact.     Gait: Gait normal.     Comments: Somewhat slow to respond, slightly sleepy but stays awake appropriately for history and exam, normal strength, EOM     ED Results / Procedures / Treatments   Labs (all labs ordered are listed, but only abnormal results are displayed) Labs Reviewed  CBC - Abnormal; Notable for the following components:      Result Value   RBC 3.59 (*)    Hemoglobin 12.9 (*)    HCT 36.0 (*)    MCV 100.3 (*)    MCH 35.9 (*)    Platelets 107 (*)    All other components within normal limits  ETHANOL - Abnormal;  Notable for the following components:   Alcohol, Ethyl (B) 328 (*)    All other components within  normal limits  COMPREHENSIVE METABOLIC PANEL - Abnormal; Notable for the following components:   Total Protein 5.8 (*)    All other components within normal limits  CBC - Abnormal; Notable for the following components:   RBC 3.65 (*)    HCT 36.5 (*)    MCH 36.4 (*)    MCHC 36.4 (*)    Platelets 111 (*)    All other components within normal limits  I-STAT CHEM 8, ED - Abnormal; Notable for the following components:   Sodium 146 (*)    Creatinine, Ser 1.50 (*)    Glucose, Bld 106 (*)    Calcium, Ion 1.03 (*)    TCO2 20 (*)    Hemoglobin 12.9 (*)    HCT 38.0 (*)    All other components within normal limits  RESP PANEL BY RT-PCR (FLU A&B, COVID) ARPGX2  URINALYSIS, ROUTINE W REFLEX MICROSCOPIC  RAPID URINE DRUG SCREEN, HOSP PERFORMED  PROTIME-INR  MAGNESIUM  PHOSPHORUS  TSH  CBC  BASIC METABOLIC PANEL  MAGNESIUM  PHOSPHORUS  CBG MONITORING, ED    EKG EKG Interpretation  Date/Time:  Tuesday April 30 2020 20:16:14 EST Ventricular Rate:  60 PR Interval:  274 QRS Duration: 112 QT Interval:  440 QTC Calculation: 440 R Axis:   -42 Text Interpretation: Sinus rhythm with 1st degree A-V block Left axis deviation Moderate voltage criteria for LVH, may be normal variant ( R in aVL , Cornell product ) Septal infarct , age undetermined Abnormal ECG No significant change since last tracing Confirmed by Gareth Morgan [phone]) on 05/01/2020 12:13:59 AM   Radiology CT HEAD WO CONTRAST  Result Date: 04/30/2020 CLINICAL DATA:  Collapsed in bathroom EXAM: CT HEAD WITHOUT CONTRAST TECHNIQUE: Contiguous axial images were obtained from the base of the skull through the vertex without intravenous contrast. COMPARISON:  MRI 09/14/2014, CT brain 08/14/2014 FINDINGS: Brain: Moderate to large amount of acute subarachnoid/extra-axial hemorrhage, most heavily concentrated at the suprasellar cistern, and anterior to the brainstem. Extra-axial hemorrhage present within the posterior fossa, with  moderate subarachnoid blood along the left temporal lobe. Small amount of inter hemispheric subarachnoid blood anteriorly with trace amount of acute hemorrhage along the left inferior frontal lobe. Small amount of subarachnoid hemorrhage within the right middle cranial fossa. Trace hemorrhage within the fourth ventricle and layering within the posterior horns of the lateral ventricles. Mild to moderate atrophy. 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 Slight interval increase in lateral and third ventricular dilatation. Stable normal sized fourth ventricle. No midline shift. Vascular: Carotid vascular calcification. Skull: Normal. Negative for fracture or focal lesion. Sinuses/Orbits: No acute finding. Other: None IMPRESSION: 1. Moderate to large amount of acute subarachnoid /extra-axial hemorrhage, most heavily concentrated at the suprasellar cistern, and anterior to the brainstem. Findings felt consistent with ruptured aneurysm. Additional foci of extra-axial blood within the posterior fossa, left greater than right middle cranial fossa, and anterior inter hemispheric region. Small amount of bilateral intraventricular and fourth ventricular hemorrhage. Slight interval increase in size/dilatation of lateral and third ventricles compared to previous. 2. Atrophy.  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 Critical Value/emergent results were called by telephone at the time of interpretation on 04/30/2020 at 10:16 pm to provider [facility] , who verbally acknowledged these results. Electronically Signed  By: Donavan Foil M.D.   On: 04/30/2020 22:16   CT Cervical Spine Wo Contrast  Result Date: 04/30/2020 CLINICAL DATA:  Found down, syncope, incontinent EXAM: CT CERVICAL SPINE WITHOUT CONTRAST TECHNIQUE: Multidetector CT imaging of the cervical spine was performed without intravenous contrast.  Multiplanar CT image reconstructions were also generated. COMPARISON:  None. FINDINGS: Alignment: Alignment is grossly anatomic. Skull base and vertebrae: No acute fracture. No primary bone lesion or focal pathologic process. Subarachnoid hemorrhage is seen at the basilar cisterns and foramen magnum. Please refer to separately reported head CT. Soft tissues and spinal canal: No prevertebral fluid or swelling. No visible canal hematoma. Moderate atherosclerosis at the carotid bifurcations. Disc levels: Severe spondylosis at C3-4, C4-5, and C5-6. Minimal symmetrical neural foraminal encroachment at those levels. Upper chest: Airway is patent.  Lung apices are clear. Other: Reconstructed images demonstrate no additional findings. IMPRESSION: 1. Multilevel cervical spondylosis.  No acute fracture. 2. Intracranial subarachnoid hemorrhage. Please refer to separately reported head CT exam. Electronically Signed   By: Randa Ngo M.D.   On: 04/30/2020 22:15    Procedures .Critical Care Performed by: Gareth Morgan, MD Authorized by: Gareth Morgan, MD   Critical care provider statement:    Critical care time (minutes):  45   Critical care was necessary to treat or prevent imminent or life-threatening deterioration of the following conditions:  CNS failure or compromise   Critical care was time spent personally by me on the following activities:  Discussions with consultants, evaluation of patient's response to treatment, examination of patient, ordering and performing treatments and interventions, ordering and review of laboratory studies, ordering and review of radiographic studies, pulse oximetry, re-evaluation of patient's condition, obtaining history from patient or surrogate and review of old charts   (including critical care time)  Medications Ordered in ED Medications  levETIRAcetam (KEPPRA) IVPB 1000 mg/100 mL premix (1,000 mg Intravenous New Bag/Given 05/01/20 0021)  LORazepam (ATIVAN) tablet  1-4 mg (has no administration in time range)    Or  LORazepam (ATIVAN) injection 1-4 mg (has no administration in time range)  thiamine tablet 100 mg (has no administration in time range)    Or  thiamine (B-1) injection 100 mg (has no administration in time range)  folic acid (FOLVITE) tablet 1 mg (has no administration in time range)  multivitamin with minerals tablet 1 tablet (has no administration in time range)  morphine 2 MG/ML injection 1-2 mg (has no administration in time range)  docusate sodium (COLACE) capsule 100 mg (has no administration in time range)  polyethylene glycol (MIRALAX / GLYCOLAX) packet 17 g (has no administration in time range)  pantoprazole (PROTONIX) injection 40 mg (has no administration in time range)  atorvastatin (LIPITOR) tablet 20 mg (has no administration in time range)  levothyroxine (SYNTHROID) tablet 50 mcg (has no administration in time range)  diphenhydrAMINE (BENADRYL) injection 25 mg (25 mg Intravenous Given 04/30/20 2357)  dexamethasone (DECADRON) injection 10 mg (10 mg Intravenous Given 04/30/20 2338)    ED Course  I have reviewed the triage vital signs and the nursing notes.  Pertinent labs & imaging results that were available during my care of the patient were reviewed by me and considered in my medical decision making (see chart for details).    MDM Rules/Calculators/A&P                          70yo male with history of alcoholism, hyperlipidemia, presents with concern for  syncope and headache.  CT done in triage consistent with [facility], likely secondary to aneurysm rupture.  Consulted Neurosurgery, Dr. Ellene Route who will assess the patient. He has allergy to contrast which is hives (no anaphylaxis)--discussed this with wife, patient and NSU and agree to giving benadryl, steroids and completing the CTA with benefits outweighing potential risks.  Initial BP 140 however repeat in 140s. Goal systolic BP less than [id number], will start nicardipine if  increasing.  Given wife history of days without etoh use, pt hx of no use today with tremors earlier discussed with Dr. Ellene Route and ordered phenobarbital for prophylaxis for seizure and  etoh withdrawal. PCCM canceld this as etoh level returned elevated.  Discussed with wife, [name].  Admitted to PCCM for further care with NSU Dr. Ellene Route consulting.      Final Clinical Impression(s) / ED Diagnoses Final diagnoses:  Subarachnoid hemorrhage [facility] [place])    Rx / DC Orders ED Discharge Orders    None       Gareth Morgan, MD 05/01/20 [phone]

## 2020-04-30 NOTE — ED Notes (Signed)
Pt removed c-collar, stated that he would like to leave. Staff advised pt to stay & be seen; c-collar put back in place

## 2020-04-30 NOTE — ED Notes (Signed)
Pt to CT

## 2020-04-30 NOTE — ED Triage Notes (Signed)
Per ems, pt coming from home. Pt's wife came home and heard a thud and found pt in the bathroom passed out. Pt is incontinent, no seizure hx. Pt drinks liquor daily, denies etoh. Pt A&Ox2. Pt does not know time or situation, pt is repetitive questioning. Not on blood thinners. Pt in c-collar placed by ems. Hematoma noted to back of head. Pt is HOH. Denies pain.   EMS CBG 108. VSS.   [name] - wife [phone] Vernona Rieger - sister [phone]

## 2020-05-01 ENCOUNTER — Inpatient Hospital Stay (HOSPITAL_COMMUNITY): Payer: Medicare Other

## 2020-05-01 DIAGNOSIS — I609 Nontraumatic subarachnoid hemorrhage, unspecified: Secondary | ICD-10-CM | POA: Diagnosis not present

## 2020-05-01 DIAGNOSIS — F1010 Alcohol abuse, uncomplicated: Secondary | ICD-10-CM | POA: Diagnosis not present

## 2020-05-01 LAB — PHOSPHORUS
Phosphorus: 4 mg/dL (ref 2.5–4.6)
Phosphorus: 3.7 mg/dL (ref 2.5–4.6)

## 2020-05-01 LAB — BASIC METABOLIC PANEL
Anion gap: 14 (ref 5–15)
BUN: 10 mg/dL (ref 8–23)
CO2: 21 mmol/L — ABNORMAL LOW (ref 22–32)
Calcium: 9 mg/dL (ref 8.9–10.3)
Chloride: 106 mmol/L (ref 98–111)
Creatinine, Ser: 1.1 mg/dL (ref 0.61–1.24)
GFR, Estimated: >60 mL/min (ref >60)
Glucose, Bld: 113 mg/dL — ABNORMAL HIGH (ref 70–99)
Potassium: 4.3 mmol/L (ref 3.5–5.1)
Sodium: 141 mmol/L (ref 135–145)

## 2020-05-01 LAB — URINALYSIS, ROUTINE W REFLEX MICROSCOPIC
Bacteria, UA: NONE SEEN
Bilirubin Urine: NEGATIVE
Glucose, UA: NEGATIVE mg/dL
Ketones, ur: NEGATIVE mg/dL
Leukocytes,Ua: NEGATIVE
Nitrite: NEGATIVE
Protein, ur: NEGATIVE mg/dL
Specific Gravity, Urine: 1.025 (ref 1.005–1.030)
pH: 5 (ref 5.0–8.0)

## 2020-05-01 LAB — CBC
HCT: 35.7 % — ABNORMAL LOW (ref 39.0–52.0)
HCT: 36.5 % — ABNORMAL LOW (ref 39.0–52.0)
Hemoglobin: 13.3 g/dL (ref 13.0–17.0)
Hemoglobin: 13.3 g/dL (ref 13.0–17.0)
MCH: 36.4 pg — ABNORMAL HIGH (ref 26.0–34.0)
MCH: 37.2 pg — ABNORMAL HIGH (ref 26.0–34.0)
MCHC: 36.4 g/dL — ABNORMAL HIGH (ref 30.0–36.0)
MCHC: 37.3 g/dL — ABNORMAL HIGH (ref 30.0–36.0)
MCV: 100 fL (ref 80.0–100.0)
MCV: 99.7 fL (ref 80.0–100.0)
Platelets: 106 10*3/uL — ABNORMAL LOW (ref 150–400)
Platelets: 111 10*3/uL — ABNORMAL LOW (ref 150–400)
RBC: 3.58 MIL/uL — ABNORMAL LOW (ref 4.22–5.81)
RBC: 3.65 MIL/uL — ABNORMAL LOW (ref 4.22–5.81)
RDW: 13.3 % (ref 11.5–15.5)
RDW: 13.3 % (ref 11.5–15.5)
WBC: 3.1 10*3/uL — ABNORMAL LOW (ref 4.0–10.5)
WBC: 5.1 10*3/uL (ref 4.0–10.5)
nRBC: 0 % (ref 0.0–0.2)
nRBC: 0 % (ref 0.0–0.2)

## 2020-05-01 LAB — PROTIME-INR
INR: 1 (ref 0.8–1.2)
Prothrombin Time: 12.7 seconds (ref 11.4–15.2)

## 2020-05-01 LAB — I-STAT CHEM 8, ED
BUN: 11 mg/dL (ref 8–23)
Calcium, Ion: 1.03 mmol/L — ABNORMAL LOW (ref 1.15–1.40)
Chloride: 105 mmol/L (ref 98–111)
Creatinine, Ser: 1.5 mg/dL — ABNORMAL HIGH (ref 0.61–1.24)
Glucose, Bld: 106 mg/dL — ABNORMAL HIGH (ref 70–99)
HCT: 38 % — ABNORMAL LOW (ref 39.0–52.0)
Hemoglobin: 12.9 g/dL — ABNORMAL LOW (ref 13.0–17.0)
Potassium: 4.1 mmol/L (ref 3.5–5.1)
Sodium: 146 mmol/L — ABNORMAL HIGH (ref 135–145)
TCO2: 20 mmol/L — ABNORMAL LOW (ref 22–32)

## 2020-05-01 LAB — RAPID URINE DRUG SCREEN, HOSP PERFORMED
Amphetamines: NOT DETECTED
Barbiturates: NOT DETECTED
Benzodiazepines: NOT DETECTED
Cocaine: NOT DETECTED
Opiates: NOT DETECTED
Tetrahydrocannabinol: NOT DETECTED

## 2020-05-01 LAB — MAGNESIUM
Magnesium: 1.5 mg/dL — ABNORMAL LOW (ref 1.7–2.4)
Magnesium: 1.3 mg/dL — ABNORMAL LOW (ref 1.7–2.4)

## 2020-05-01 LAB — RESP PANEL BY RT-PCR (FLU A&B, COVID) ARPGX2
Influenza A by PCR: NEGATIVE
Influenza B by PCR: NEGATIVE
SARS Coronavirus 2 by RT PCR: NEGATIVE

## 2020-05-01 LAB — CBG MONITORING, ED: Glucose-Capillary: 81 mg/dL (ref 70–99)

## 2020-05-01 LAB — TSH: TSH: 1.104 u[IU]/mL (ref 0.350–4.500)

## 2020-05-01 MED ORDER — ATORVASTATIN CALCIUM 10 MG PO TABS: 20.0000 mg | ORAL_TABLET | Freq: Every day | ORAL | Status: DC

## 2020-05-01 MED ORDER — MAGNESIUM SULFATE 2 GM/50ML IV SOLN: 2.0000 g | Freq: Once | INTRAVENOUS | Status: DC

## 2020-05-01 MED ORDER — LEVOTHYROXINE SODIUM 50 MCG PO TABS
50.0000 ug | ORAL_TABLET | Freq: Every day | ORAL | Status: DC
  Administered 2020-05-02 – 2020-05-10 (×9): 50 ug via ORAL
  Filled 2020-05-01 (×9): qty 1

## 2020-05-01 MED ORDER — IOHEXOL 350 MG/ML SOLN
75.0000 mL | Freq: Once | INTRAVENOUS | Status: AC | PRN
  Administered 2020-05-01: 75 mL via INTRAVENOUS

## 2020-05-01 MED ORDER — MAGNESIUM SULFATE 2 GM/50ML IV SOLN
2.0000 g | Freq: Once | INTRAVENOUS | Status: AC
  Administered 2020-05-01: 2 g via INTRAVENOUS
  Filled 2020-05-01: qty 50

## 2020-05-01 MED ORDER — HYDRALAZINE HCL 20 MG/ML IJ SOLN
10.0000 mg | INTRAMUSCULAR | Status: DC | PRN
  Administered 2020-05-02: 20 mg via INTRAVENOUS
  Filled 2020-05-01: qty 1

## 2020-05-01 MED ORDER — ATORVASTATIN CALCIUM 10 MG PO TABS
20.0000 mg | ORAL_TABLET | Freq: Every day | ORAL | Status: DC
  Administered 2020-05-02 – 2020-05-10 (×9): 20 mg via ORAL
  Filled 2020-05-01 (×11): qty 2

## 2020-05-01 MED ORDER — CALCIUM GLUCONATE-NACL 1-0.675 GM/50ML-% IV SOLN
1.0000 g | Freq: Once | INTRAVENOUS | Status: AC
  Administered 2020-05-01: 1000 mg via INTRAVENOUS
  Filled 2020-05-01: qty 50

## 2020-05-01 NOTE — ED Notes (Signed)
approx 2140, walked in to room, pt standing at edge of bed, monitoring removed, vomiting food particle emesis. On assessment, the pt has drooping of the left eyelid, left pupil >right, left facial drooping, decreased left facial, left arm and left leg sensation. Able to MAEx4 spontaneously. He was talking on the phone with his wife, who says "he is not making any sense". He is able to say he is oriented to place and person. CCM was paged, Mikal Plane was paged, arrived in scanner approx 2215. Updated pt wife on status. Since arrival back to room, have spoke with CCM to update on status and Mikal Plane has been to bedside to assess patient. VSS.

## 2020-05-01 NOTE — Progress Notes (Signed)
Patient ID: Jeffrey Acosta, male   DOB: 1950-12-22, 70 y.o.   MRN: [id number] BP 116/88   Pulse 73   Temp (!) 97.3 F (36.3 C) (Temporal)   Resp 19   Ht 6\' 1"  (1.854 m)   Wt 95.3 kg   SpO2 96%   BMI 27.71 kg/m  Alert, oriented x 4, no drift, moving all extremities 3rd nerve palsy, pupil dilated, unable to open eye. There is significant blood in region of left 3rd nerve so reasonable for isolated 3rd. No hydrocephalus, no herniation appreciated on repeat CT tonight.  Will continue to monitor closely  Left facial droop

## 2020-05-01 NOTE — Consult Note (Signed)
Reason for Consult: Subarachnoid hemorrhage Referring Physician: Dr. Lou Miner is an 70 y.o. male.  HPI: Patient is a 70 year old individual who apparently was found down.  He does not recall how he got to the hospital but a CT scan demonstrated presence of substantial subarachnoid hemorrhage in the posterior fossa and the perimesencephalic cisterns and into the left sylvian fissure.  It is suspected that he may have an aneurysmal subarachnoid hemorrhage.  Patient complained bitterly of headache and neck pain initially but this seemed to rapidly resolve after some mild medication.  CT angiogram of the intracranial contents demonstrates no evidence of an aneurysm.  The quality of the study is good.  The patient's headache has rapidly resolved and he notes that though he still having some head pain is eminently tolerable.  He would be a Hunt and Hess grade 0 from a clinical perspective.  Patient admits to significant alcohol intake and he admits to being an alcoholic.  He notes that he had been sober for about 18 years and then reverted to drinking and he has been working to become sober again.  Past Medical History:  Diagnosis Date  . Alcoholism in remission ([place])    in [place]  . Arrhythmia    atrial tachycardia  . Bipolar affective disorder ([place])   . Diverticulosis of colon (without mention of hemorrhage)   . GERD (gastroesophageal reflux disease)   . Gout of multiple sites   . Hiatal hernia   . Hx of migraines   . Hydrocele   . HYPERLIPIDEMIA 01/10/2009   Qualifier: Diagnosis of  By: Ronnald Ramp CMA, [place]    NMR Lipoprofile 2011 LDL could not be calculated  Due to TG of 889  (811  / 750 ),  HDL 50 . Father MI @ 12 PGM CVA early 50s; PGF CVA @72    . Hypothyroidism   . Melanoma of skin, site unspecified 11/25/2012   07/2011 abdominal  Stage 1 Dr Amy [place] Dr Sarajane Jews   . Osteopenia   . Polyarthralgia     Past Surgical History:  Procedure Laterality Date  . BILIARY BRUSHING   12/07/2018   Procedure: BILIARY BRUSHING;  Surgeon: Rush Landmark Telford Nab., MD;  Location: [facility];  Service: Gastroenterology;;  . BILIARY DILATION  12/07/2018   Procedure: BILIARY DILATION;  Surgeon: Irving Copas., MD;  Location: [facility];  Service: Gastroenterology;;  . BIOPSY  12/07/2018   Procedure: BIOPSY;  Surgeon: Irving Copas., MD;  Location: [facility];  Service: Gastroenterology;;  . COLONOSCOPY  2003   negative ; Dr Sharlett Iles  . ERCP N/A 12/07/2018   Procedure: ENDOSCOPIC RETROGRADE CHOLANGIOPANCREATOGRAPHY (ERCP);  Surgeon: Irving Copas., MD;  Location: [facility];  Service: Gastroenterology;  Laterality: N/A;  . ESOPHAGOGASTRODUODENOSCOPY (EGD) WITH PROPOFOL N/A 12/07/2018   Procedure: ESOPHAGOGASTRODUODENOSCOPY (EGD) WITH PROPOFOL;  Surgeon: Rush Landmark Telford Nab., MD;  Location: [facility];  Service: Gastroenterology;  Laterality: N/A;  . EUS N/A 12/07/2018   Procedure: UPPER ENDOSCOPIC ULTRASOUND (EUS) RADIAL;  Surgeon: Rush Landmark Telford Nab., MD;  Location[facility];  Service: Gastroenterology;  Laterality: N/A;  . INGUINAL HERNIA REPAIR  08/07/2011   Procedure: HERNIA REPAIR INGUINAL ADULT;  Surgeon: Rolm Bookbinder, MD;  Location: [place] [facility];  Service: General;  Laterality: Right;  . MELANOMA EXCISION  07/28/11   Dr Sarajane Jews; stomach and chest  . REMOVAL OF STONES  12/07/2018   Procedure: REMOVAL OF STONES;  Surgeon: Rush Landmark Telford Nab., MD;  Location: [facility];  Service:  Gastroenterology;;  . [name]  1953  . 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 UPPER GASTROINTESTINAL ENDOSCOPY  1983   hiatal hernia  . VASECTOMY  1979    Family History  Problem Relation Age of Onset  . COPD Father   . Heart attack Father 54  . COPD Mother   . Diabetes Paternal Grandmother   . Parkinsonism Paternal Grandmother   . Stroke Paternal Grandmother 24  . Stroke Maternal Grandfather 72  . Lung cancer  Paternal Grandfather        Heavy smoker  . Colon cancer Neg Hx   . Prostate cancer Neg Hx     Social History:  reports that he has never smoked. He has never used smokeless tobacco. He reports previous alcohol use. He reports that he does not use drugs.  Allergies:  Allergies  Allergen Reactions  . Omnipaque [Iohexol] Hives     Desc: developed hives after injection; resolved with 50 mg benadryl given to pt.     Medications: I have reviewed the patient's current medications.  Results for orders placed or performed during the hospital encounter of 04/30/20 (from the past 48 hour(s))  CBC     Status: Abnormal   Collection Time: 04/30/20  8:38 PM  Result Value Ref Range   WBC 4.6 4.0 - 10.5 K/uL   RBC 3.59 (L) 4.22 - 5.81 MIL/uL   Hemoglobin 12.9 (L) 13.0 - 17.0 g/dL   HCT 36.0 (L) 39.0 - 52.0 %   MCV 100.3 (H) 80.0 - 100.0 fL   MCH 35.9 (H) 26.0 - 34.0 pg   MCHC 35.8 30.0 - 36.0 g/dL   RDW 13.3 11.5 - 15.5 %   Platelets 107 (L) 150 - 400 K/uL    Comment: REPEATED TO VERIFY PLATELET COUNT CONFIRMED BY SMEAR SPECIMEN CHECKED FOR CLOTS Immature Platelet Fraction may be clinically indicated, consider ordering this additional test [id number]    nRBC 0.0 0.0 - 0.2 %    Comment: Performed at [facility], [place] [address], [place], [place] [address]  Ethanol     Status: Abnormal   Collection Time: 04/30/20  8:38 PM  Result Value Ref Range   Alcohol, Ethyl (B) 328 (HH) <10 mg/dL    Comment: CRITICAL RESULT CALLED TO, READ BACK BY AND VERIFIED WITH: Billie Lade 04/30/20 2228 WAYK Performed at [facility] [address] [place], [place] [address]   Comprehensive metabolic panel     Status: Abnormal   Collection Time: 04/30/20  8:38 PM  Result Value Ref Range   Sodium 141 135 - 145 mmol/L   Potassium 3.8 3.5 - 5.1 mmol/L   Chloride 106 98 - 111 mmol/L   CO2 23 22 - 32 mmol/L   Glucose, Bld 93 70 - 99 mg/dL    Comment: Glucose reference range applies only to  samples taken after fasting for at least 8 hours.   BUN 10 8 - 23 mg/dL   Creatinine, Ser 1.16 0.61 - 1.24 mg/dL   Calcium 9.0 8.9 - 10.3 mg/dL   Total Protein 5.8 (L) 6.5 - 8.1 g/dL   Albumin 3.6 3.5 - 5.0 g/dL   AST 29 15 - 41 U/L   ALT 15 0 - 44 U/L   Alkaline Phosphatase 51 38 - 126 U/L   Total Bilirubin 0.9 0.3 - 1.2 mg/dL   GFR, Estimated >60 >60 mL/min    Comment: (NOTE) Calculated using the CKD-EPI Creatinine Equation (  2021)    Anion gap 12 5 - 15    Comment: Performed at [facility], [place] [address], [place], [place] [address]  Protime-INR     Status: None   Collection Time: 05/01/20 12:07 AM  Result Value Ref Range   Prothrombin Time 12.7 11.4 - 15.2 seconds   INR 1.0 0.8 - 1.2    Comment: (NOTE) INR goal varies based on device and disease states. Performed at [facility], [place] [address] [place], [place] [address]   Magnesium     Status: Abnormal   Collection Time: 05/01/20 12:07 AM  Result Value Ref Range   Magnesium 1.5 (L) 1.7 - 2.4 mg/dL    Comment: Performed at [facility] [address] [place], [place] [address]  Phosphorus     Status: None   Collection Time: 05/01/20 12:07 AM  Result Value Ref Range   Phosphorus 4.0 2.5 - 4.6 mg/dL    Comment: Performed at [facility] [address], [place], [name] [address]  CBC     Status: Abnormal   Collection Time: 05/01/20 12:07 AM  Result Value Ref Range   WBC 5.1 4.0 - 10.5 K/uL   RBC 3.65 (L) 4.22 - 5.81 MIL/uL   Hemoglobin 13.3 13.0 - 17.0 g/dL   HCT 36.5 (L) 39.0 - 52.0 %   MCV 100.0 80.0 - 100.0 fL   MCH 36.4 (H) 26.0 - 34.0 pg   MCHC 36.4 (H) 30.0 - 36.0 g/dL   RDW 13.3 11.5 - 15.5 %   Platelets 111 (L) 150 - 400 K/uL    Comment: Immature Platelet Fraction may be clinically indicated, consider ordering this additional test [id number] CONSISTENT WITH PREVIOUS RESULT    nRBC 0.0 0.0 - 0.2 %    Comment: Performed at [facility], [place] [address] [place], [place]  [address]  TSH     Status: None   Collection Time: 05/01/20 12:07 AM  Result Value Ref Range   TSH 1.104 0.350 - 4.500 uIU/mL    Comment: Performed by a 3rd Generation assay with a functional sensitivity of <=0.01 uIU/mL. Performed at [facility], [place] [address], [place], [place] [address]   Resp Panel by RT-PCR (Flu A&B, Covid) Nasopharyngeal Swab     Status: None   Collection Time: 05/01/20 12:08 AM   Specimen: Nasopharyngeal Swab; Nasopharyngeal(NP) swabs in vial transport medium  Result Value Ref Range   SARS Coronavirus 2 by RT PCR NEGATIVE NEGATIVE    Comment: (NOTE) SARS-CoV-2 target nucleic acids are NOT DETECTED.  The SARS-CoV-2 RNA is generally detectable in upper respiratory specimens during the acute phase of infection. The lowest concentration of SARS-CoV-2 viral copies this assay can detect is 138 copies/mL. A negative result does not preclude SARS-Cov-2 infection and should not be used as the sole basis for treatment or other patient management decisions. A negative result may occur with  improper specimen collection/handling, submission of specimen other than nasopharyngeal swab, presence of viral mutation(s) within the areas targeted by this assay, and inadequate number of viral copies(<138 copies/mL). A negative result must be combined with clinical observations, patient history, and epidemiological information. The expected result is Negative.  Fact Sheet for Patients:  EntrepreneurPulse.com.au  Fact Sheet for Healthcare Providers:  IncredibleEmployment.be  This test is no t yet approved or cleared by the [place] FDA and  has been authorized for detection and/or diagnosis of SARS-CoV-2 by FDA under  an Emergency Use Authorization (EUA). This EUA will remain  in effect (meaning this test can be used) for the duration of the COVID-19 declaration under Section 564(b)(1) of the Act, 21 U.S.C.section 360bbb-3(b)(1),  unless the authorization is terminated  or revoked sooner.       Influenza A by PCR NEGATIVE NEGATIVE   Influenza B by PCR NEGATIVE NEGATIVE    Comment: (NOTE) The Xpert Xpress SARS-CoV-2/FLU/RSV plus assay is intended as an aid in the diagnosis of influenza from Nasopharyngeal swab specimens and should not be used as a sole basis for treatment. Nasal washings and aspirates are unacceptable for Xpert Xpress SARS-CoV-2/FLU/RSV testing.  Fact Sheet for Patients: BloggerCourse.com  Fact Sheet for Healthcare Providers: SeriousBroker.it  This test is not yet approved or cleared by the [place] FDA and has been authorized for detection and/or diagnosis of SARS-CoV-2 by FDA under an Emergency Use Authorization (EUA). This EUA will remain in effect (meaning this test can be used) for the duration of the COVID-19 declaration under Section 564(b)(1) of the Act, 21 U.S.C. section 360bbb-3(b)(1), unless the authorization is terminated or revoked.  Performed at [facility], [address], [place], [place] [address]   I-stat chem 8, ED (not at [facility] or [facility])     Status: Abnormal   Collection Time: 05/01/20 12:26 AM  Result Value Ref Range   Sodium 146 (H) 135 - 145 mmol/L   Potassium 4.1 3.5 - 5.1 mmol/L   Chloride 105 98 - 111 mmol/L   BUN 11 8 - 23 mg/dL   Creatinine, Ser 0.76 (H) 0.61 - 1.24 mg/dL   Glucose, Bld 226 (H) 70 - 99 mg/dL    Comment: Glucose reference range applies only to samples taken after fasting for at least 8 hours.   Calcium, Ion 1.03 (L) 1.15 - 1.40 mmol/L   TCO2 20 (L) 22 - 32 mmol/L   Hemoglobin 12.9 (L) 13.0 - 17.0 g/dL   HCT 33.3 (L) 54.5 - 62.5 %  Urinalysis, Routine w reflex microscopic Urine, Clean Catch     Status: Abnormal   Collection Time: 05/01/20  2:59 AM  Result Value Ref Range   Color, Urine YELLOW YELLOW   APPearance CLEAR CLEAR   Specific Gravity, Urine 1.025 1.005 - 1.030   pH 5.0  5.0 - 8.0   Glucose, UA NEGATIVE NEGATIVE mg/dL   Hgb urine dipstick SMALL (A) NEGATIVE   Bilirubin Urine NEGATIVE NEGATIVE   Ketones, ur NEGATIVE NEGATIVE mg/dL   Protein, ur NEGATIVE NEGATIVE mg/dL   Nitrite NEGATIVE NEGATIVE   Leukocytes,Ua NEGATIVE NEGATIVE   RBC / HPF 6-10 0 - 5 RBC/hpf   WBC, UA 0-5 0 - 5 WBC/hpf   Bacteria, UA NONE SEEN NONE SEEN   Mucus PRESENT     Comment: Performed at [facility], [address], [place], [place] [address]  Rapid urine drug screen (hospital performed)     Status: None   Collection Time: 05/01/20  2:59 AM  Result Value Ref Range   Opiates NONE DETECTED NONE DETECTED   Cocaine NONE DETECTED NONE DETECTED   Benzodiazepines NONE DETECTED NONE DETECTED   Amphetamines NONE DETECTED NONE DETECTED   Tetrahydrocannabinol NONE DETECTED NONE DETECTED   Barbiturates NONE DETECTED NONE DETECTED    Comment: (NOTE) DRUG SCREEN FOR MEDICAL PURPOSES ONLY.  IF CONFIRMATION IS NEEDED FOR ANY PURPOSE, NOTIFY LAB WITHIN 5 DAYS.  LOWEST DETECTABLE LIMITS FOR URINE DRUG SCREEN Drug Class  Cutoff (ng/mL) Amphetamine and metabolites    1000 Barbiturate and metabolites    200 Benzodiazepine                 [id number] Tricyclics and metabolites     300 Opiates and metabolites        300 Cocaine and metabolites        300 THC                            50 Performed at [facility], [place] [address], [place], [name] [address]   CBC     Status: Abnormal   Collection Time: 05/01/20  5:00 AM  Result Value Ref Range   WBC 3.1 (L) 4.0 - 10.5 K/uL   RBC 3.58 (L) 4.22 - 5.81 MIL/uL   Hemoglobin 13.3 13.0 - 17.0 g/dL   HCT 35.7 (L) 39.0 - 52.0 %   MCV 99.7 80.0 - 100.0 fL   MCH 37.2 (H) 26.0 - 34.0 pg   MCHC 37.3 (H) 30.0 - 36.0 g/dL   RDW 13.3 11.5 - 15.5 %   Platelets 106 (L) 150 - 400 K/uL    Comment: Immature Platelet Fraction may be clinically indicated, consider ordering this additional test [id number] CONSISTENT WITH  PREVIOUS RESULT    nRBC 0.0 0.0 - 0.2 %    Comment: Performed at [facility], [place] [address] [place], [place] [id number]  Basic metabolic panel     Status: Abnormal   Collection Time: 05/01/20  5:00 AM  Result Value Ref Range   Sodium 141 135 - 145 mmol/L   Potassium 4.3 3.5 - 5.1 mmol/L   Chloride 106 98 - 111 mmol/L   CO2 21 (L) 22 - 32 mmol/L   Glucose, Bld 113 (H) 70 - 99 mg/dL    Comment: Glucose reference range applies only to samples taken after fasting for at least 8 hours.   BUN 10 8 - 23 mg/dL   Creatinine, Ser 1.10 0.61 - 1.24 mg/dL   Calcium 9.0 8.9 - 10.3 mg/dL   GFR, Estimated >60 >60 mL/min    Comment: (NOTE) Calculated using the CKD-EPI Creatinine Equation (2021)    Anion gap 14 5 - 15    Comment: Performed at [facility] [address], [place], [place] [address]  Magnesium     Status: Abnormal   Collection Time: 05/01/20  5:00 AM  Result Value Ref Range   Magnesium 1.3 (L) 1.7 - 2.4 mg/dL    Comment: Performed at [facility] [address], [place], [place] [address]  Phosphorus     Status: None   Collection Time: 05/01/20  5:00 AM  Result Value Ref Range   Phosphorus 3.7 2.5 - 4.6 mg/dL    Comment: Performed at [facility] [address] [place], [place] [address]    CT Angio Head W or Wo Contrast  Result Date: 05/01/2020 CLINICAL DATA:  Initial evaluation for acute subarachnoid hemorrhage, evaluate for aneurysm. EXAM: CT ANGIOGRAPHY HEAD AND NECK TECHNIQUE: Multidetector CT imaging of the head and neck was performed using the standard protocol during bolus administration of intravenous contrast. Multiplanar CT image reconstructions and MIPs were obtained to evaluate the vascular anatomy. Carotid stenosis measurements (when applicable) are obtained utilizing NASCET criteria, using the distal internal carotid diameter as the denominator. CONTRAST:  91mL OMNIPAQUE IOHEXOL 350 MG/ML SOLN COMPARISON:  Prior head CT  from 04/30/2020.  FINDINGS: CTA NECK FINDINGS Aortic arch: Aortic arch of normal caliber with normal 3 vessel morphology. Mild atheromatous change within the arch itself. No hemodynamically significant stenosis about the origin of the great vessels. Right carotid system: Right CCA patent from its origin to the bifurcation without stenosis. Scattered calcified plaque about the right bifurcation/proximal right ICA with no more than 40% stenosis by NASCET criteria. Right ICA widely patent distally to the skull base without stenosis, dissection or occlusion. Left carotid system: Left CCA patent from its origin to the bifurcation without stenosis. Scattered atheromatous plaque about the left bifurcation/proximal left ICA without hemodynamically significant stenosis. Left ICA widely patent distally to the skull base without stenosis, dissection or occlusion. Vertebral arteries: Both vertebral arteries arise from the subclavian arteries. No proximal subclavian artery stenosis. Right vertebral artery slightly dominant. Vertebral arteries patent within the neck without stenosis, dissection or occlusion. Skeleton: Age indeterminate wedging seen at the superior endplates of C7, T3, and T4. No other visible osseous abnormality. Moderate multilevel cervical spondylosis without high-grade stenosis. Other neck: No other acute soft tissue abnormality within the neck. No mass or adenopathy. Upper chest: Visualized upper chest demonstrates no acute finding. Review of the MIP images confirms the above findings CTA HEAD FINDINGS Anterior circulation: Petrous segments widely patent bilaterally. Scattered atheromatous plaque within the carotid siphons without hemodynamically significant stenosis or other abnormality. ICA termini well perfused. A1 segments patent bilaterally. Left A1 hypoplastic. Normal anterior communicating artery complex. Anterior cerebral arteries patent to their distal aspects without stenosis. No M1 stenosis or occlusion. Normal MCA  bifurcations. Distal MCA branches well perfused and fairly symmetric. Posterior circulation: Both vertebral arteries patent to the vertebrobasilar junction without stenosis. Both PICA origins patent and normal. Basilar patent to its distal aspect without stenosis. Superior cerebellar arteries patent bilaterally. Both PCAs primarily supplied via the basilar. PCAs well perfused to their distal aspects without stenosis. Venous sinuses: Grossly patent allowing for timing the contrast bolus. Anatomic variants: Hypoplastic left A1 segment. No visible aneurysm or other vascular abnormality evident by CTA. Review of the MIP images confirms the above findings IMPRESSION: 1. Negative intracranial CTA. No visible aneurysm or other vascular abnormality to explain patient's intracranial hemorrhage. 2. No large vessel occlusion. 3. Mild for age atheromatous change about the carotid bifurcations and carotid siphons without hemodynamically significant stenosis. 4. Age-indeterminate compression deformities at the superior endplates of C7, T3, and T4. Correlation with physical exam recommended. Findings could be further assessed with dedicated MRI as clinically warranted. Electronically Signed   By: Jeannine Boga M.D.   On: 05/01/2020 02:36   CT HEAD WO CONTRAST  Result Date: 04/30/2020 CLINICAL DATA:  Collapsed in bathroom EXAM: CT HEAD WITHOUT CONTRAST TECHNIQUE: Contiguous axial images were obtained from the base of the skull through the vertex without intravenous contrast. COMPARISON:  MRI 09/14/2014, CT brain 08/14/2014 FINDINGS: Brain: Moderate to large amount of acute subarachnoid/extra-axial hemorrhage, most heavily concentrated at the suprasellar cistern, and anterior to the brainstem. Extra-axial hemorrhage present within the posterior fossa, with moderate subarachnoid blood along the left temporal lobe. Small amount of inter hemispheric subarachnoid blood anteriorly with trace amount of acute hemorrhage along the  left inferior frontal lobe. Small amount of subarachnoid hemorrhage within the right middle cranial fossa. Trace hemorrhage within the fourth ventricle and layering within the posterior horns of the lateral ventricles. Mild to moderate atrophy. 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 Slight interval increase in lateral and third ventricular dilatation. Stable normal sized fourth ventricle. No midline shift. Vascular: Carotid vascular calcification. Skull: Normal. Negative for fracture or focal lesion. Sinuses/Orbits: No acute finding. Other: None IMPRESSION: 1. Moderate to large amount of acute subarachnoid /extra-axial hemorrhage, most heavily concentrated at the suprasellar cistern, and anterior to the brainstem. Findings felt consistent with ruptured aneurysm. Additional foci of extra-axial blood within the posterior fossa, left greater than right middle cranial fossa, and anterior inter hemispheric region. Small amount of bilateral intraventricular and fourth ventricular hemorrhage. Slight interval increase in size/dilatation of lateral and third ventricles compared to previous. 2. Atrophy.  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 Critical Value/emergent results were called by telephone at the time of interpretation on 04/30/2020 at 10:16 pm to provider [facility] , who verbally acknowledged these results. Electronically Signed   By: Donavan Foil M.D.   On: 04/30/2020 22:16   CT Angio Neck W and/or Wo Contrast  Result Date: 05/01/2020 CLINICAL DATA:  Initial evaluation for acute subarachnoid hemorrhage, evaluate for aneurysm. EXAM: CT ANGIOGRAPHY HEAD AND NECK TECHNIQUE: Multidetector CT imaging of the head and neck was performed using the standard protocol during bolus administration of intravenous contrast. Multiplanar CT image reconstructions and MIPs were obtained to evaluate the  vascular anatomy. Carotid stenosis measurements (when applicable) are obtained utilizing NASCET criteria, using the distal internal carotid diameter as the denominator. CONTRAST:  16mL OMNIPAQUE IOHEXOL 350 MG/ML SOLN COMPARISON:  Prior head CT from 04/30/2020. FINDINGS: CTA NECK FINDINGS Aortic arch: Aortic arch of normal caliber with normal 3 vessel morphology. Mild atheromatous change within the arch itself. No hemodynamically significant stenosis about the origin of the great vessels. Right carotid system: Right CCA patent from its origin to the bifurcation without stenosis. Scattered calcified plaque about the right bifurcation/proximal right ICA with no more than 40% stenosis by NASCET criteria. Right ICA widely patent distally to the skull base without stenosis, dissection or occlusion. Left carotid system: Left CCA patent from its origin to the bifurcation without stenosis. Scattered atheromatous plaque about the left bifurcation/proximal left ICA without hemodynamically significant stenosis. Left ICA widely patent distally to the skull base without stenosis, dissection or occlusion. Vertebral arteries: Both vertebral arteries arise from the subclavian arteries. No proximal subclavian artery stenosis. Right vertebral artery slightly dominant. Vertebral arteries patent within the neck without stenosis, dissection or occlusion. Skeleton: Age indeterminate wedging seen at the superior endplates of C7, T3, and T4. No other visible osseous abnormality. Moderate multilevel cervical spondylosis without high-grade stenosis. Other neck: No other acute soft tissue abnormality within the neck. No mass or adenopathy. Upper chest: Visualized upper chest demonstrates no acute finding. Review of the MIP images confirms the above findings CTA HEAD FINDINGS Anterior circulation: Petrous segments widely patent bilaterally. Scattered atheromatous plaque within the carotid siphons without hemodynamically significant stenosis or  other abnormality. ICA termini well perfused. A1 segments patent bilaterally. Left A1 hypoplastic. Normal anterior communicating artery complex. Anterior cerebral arteries patent to their distal aspects without stenosis. No M1 stenosis or occlusion. Normal MCA bifurcations. Distal MCA branches well perfused and fairly symmetric. Posterior circulation: Both vertebral arteries patent to the vertebrobasilar junction without stenosis. Both PICA origins patent and normal. Basilar patent to its distal aspect without stenosis. Superior cerebellar arteries patent bilaterally. Both PCAs primarily supplied via the basilar. PCAs well perfused to their distal aspects without stenosis. Venous sinuses: Grossly patent allowing for timing the contrast bolus. Anatomic variants: Hypoplastic left  A1 segment. No visible aneurysm or other vascular abnormality evident by CTA. Review of the MIP images confirms the above findings IMPRESSION: 1. Negative intracranial CTA. No visible aneurysm or other vascular abnormality to explain patient's intracranial hemorrhage. 2. No large vessel occlusion. 3. Mild for age atheromatous change about the carotid bifurcations and carotid siphons without hemodynamically significant stenosis. 4. Age-indeterminate compression deformities at the superior endplates of C7, T3, and T4. Correlation with physical exam recommended. Findings could be further assessed with dedicated MRI as clinically warranted. Electronically Signed   By: Jeannine Boga M.D.   On: 05/01/2020 02:36   CT Cervical Spine Wo Contrast  Result Date: 04/30/2020 CLINICAL DATA:  Found down, syncope, incontinent EXAM: CT CERVICAL SPINE WITHOUT CONTRAST TECHNIQUE: Multidetector CT imaging of the cervical spine was performed without intravenous contrast. Multiplanar CT image reconstructions were also generated. COMPARISON:  None. FINDINGS: Alignment: Alignment is grossly anatomic. Skull base and vertebrae: No acute fracture. No primary  bone lesion or focal pathologic process. Subarachnoid hemorrhage is seen at the basilar cisterns and foramen magnum. Please refer to separately reported head CT. Soft tissues and spinal canal: No prevertebral fluid or swelling. No visible canal hematoma. Moderate atherosclerosis at the carotid bifurcations. Disc levels: Severe spondylosis at C3-4, C4-5, and C5-6. Minimal symmetrical neural foraminal encroachment at those levels. Upper chest: Airway is patent.  Lung apices are clear. Other: Reconstructed images demonstrate no additional findings. IMPRESSION: 1. Multilevel cervical spondylosis.  No acute fracture. 2. Intracranial subarachnoid hemorrhage. Please refer to separately reported head CT exam. Electronically Signed   By: Randa Ngo M.D.   On: 04/30/2020 22:15    Review of Systems  Unable to perform ROS: Acuity of condition   Blood pressure 130/81, pulse 76, temperature 98 F (36.7 C), temperature source Oral, resp. rate 15, height 6\' 1"  (1.854 m), weight 95.3 kg, SpO2 98 %. Physical Exam Constitutional:      Appearance: Normal appearance. He is normal weight.  HENT:     Head: Normocephalic and atraumatic.  Neurological:     Mental Status: He is alert.     Comments: Patient is awake and alert.  He answers questions appropriately.  Cranial nerve examination reveals the pupils are 3 mm briskly reactive light and accommodation extraocular movements are full face is symmetric to grimace.  Uvula is in the midline.  Tongue extends in the midline.  Motor strength is symmetric in the upper and lower extremities.  There is 1+ meningismus of the cervical spine.  Deep tendon reflexes are symmetric.     Assessment/Plan: Subarachnoid hemorrhage likely secondary to perimesencephalic venous hemorrhage.  Plan supportive care for the next 24 to 48 hours.  He can be mobilized as tolerated.  If symptoms continue to resolve and he has no evidence of any neurologic deterioration he will be able to be  discharged home soon.  Blanchie Dessert Darick Fetters 05/01/2020, 6:27 PM

## 2020-05-01 NOTE — Progress Notes (Addendum)
NAME:  Jeffrey Acosta, MRN:  [id number], DOB:  [birth year], LOS: 1 ADMISSION DATE:  04/30/2020, CONSULTATION DATE:  1/4 REFERRING MD:  Dr. Dalene Seltzer, CHIEF COMPLAINT:  SAH   Brief History:  70 year old male admitted 1/4 with SAH after collapsing at home with loss of consciousness. SAH described on imaging in ED. Admit ICU under PCCM.   History of Present Illness:  70 year old male with PMH as below, which is significant for alcohol abuse, atrial tachycardia, gout, hypothyroid, and Barrett's esophagus. He has been drinking recently, but had reportedly been sober for about 3-4 days when on 1/4 he collapsed into bathroom. Wife did not immediately witness the event, but did hear the thud of his fall and rushed to find him unconscious on the floor. She did not describe seizure like activity. EMS was called and transported him to [facility] ED where he was more alert and complaining of headache x 1 hour. He was subsequently sent for CT of the head, demonstrating subarachnoid hemorrhage. Neurosurgery was consulted by the ED physician and felt the patient was most appropriate for medical admission to ICU under PCCM.   Past Medical History:   has a past medical history of Alcoholism in remission (HCC), Arrhythmia, Bipolar affective disorder (HCC), Diverticulosis of colon (without mention of hemorrhage), GERD (gastroesophageal reflux disease), Gout of multiple sites, Hiatal hernia, migraines, Hydrocele, HYPERLIPIDEMIA (01/10/2009), Hypothyroidism, Melanoma of skin, site unspecified (11/25/2012), Osteopenia, and Polyarthralgia.   Significant Hospital Events:  1/4 admit  Consults:  Neurosurgery  Procedures:    Significant Diagnostic Tests:  CT head 1/4 > Moderate to large amount of acute subarachnoid /extra-axial hemorrhage, most heavily concentrated at the suprasellar cistern, and anterior to the brainstem. Findings felt consistent with ruptured aneurysm. Additional foci of extra-axial blood within the  posterior fossa, left greater than right middle cranial fossa, and anterior inter hemispheric region. Small amount of bilateral intraventricular and fourth ventricular hemorrhage. Suspected left anterior subdural hygroma as well.  CT C-spine 1/4 > multilevel cervical spondylosis. No acute fracture.  CTA head/neck 1/4 > negative for aneurysm or other vascular abnormality.  No LVO.  Age indeterminate compression deformities at the superior endplates of C7, T3, T4.  Micro Data:  COVID 1/4 > neg. Flu 1/4 > neg.  Antimicrobials:     Interim History / Subjective:  Comfortable.  No complaints.  Headache improved.  Objective   Blood pressure 116/75, pulse 68, temperature 98.1 F (36.7 C), temperature source Oral, resp. rate 12, height 6\' 1"  (1.854 m), weight 95.3 kg, SpO2 98 %.       No intake or output data in the 24 hours ending 05/01/20 0904 Filed Weights   04/30/20 2021  Weight: 95.3 kg    Examination: General: Adult male, resting in bed, in NAD. Neuro: A&O x 3, no deficits. HEENT: [place]/AT. Sclerae anicteric. EOMI. Cardiovascular: RRR, no M/R/G.  Lungs: Respirations even and unlabored.  CTA bilaterally, No W/R/R. Abdomen: BS x 4, soft, NT/ND.  Musculoskeletal: No gross deformities, no edema.  Skin: Intact, warm, no rashes.   Assessment & Plan:   Subarachnoid hemorrhage: unclear etiology, CTA neg for aneurysm or other vascular abnormality. Subdural hygroma: left anterior.  - Admit to neuro ICU - Management per neurosurgery, currently medical management only.  Formal neurosurgery consult note pending. - Keep SBP < 140 mmHG, PRN hydralazine. May need infusion - Continue keppra for seizure prophylaxis - No role nimodipine as non-aneurysmal SAH  Alcohol abuse: Concern for withdrawal, as wife  describes tremulousness prior to this event, however alcohol level 320 on admission. He is not tremulous on exam and he is not delirious or tachycardic - CIWA protocol with ativan - Thiamin /  Folate - Cessation counseling will be needed prior to discharge  Thrombocytopenia: 107 on admission, coags normal. This is a chronic issue, which has been worked up by hematology and alcoholism is felt to be a contributor. - Platelets not low enough to require transfusion  Atrial tachycardia - Hold home metoprolol for now   Bipolar disorder - Hold Lamictal and Seroquel briefly  Hypothyroidism - Continue home sythroid  Hyperlipidemia - continue atorvastatin  GERD/ Barrett's esophagus  - PPI   Hypomagnesemia - 2g Mag  Best practice (evaluated daily)  Diet: Advance diet gradually Pain/Anxiety/Delirium protocol (if indicated): Morphine PRN, CIWA ativan VAP protocol (if indicated): NA DVT prophylaxis: SCD GI prophylaxis: PPI Glucose control: NA Mobility: BR Disposition:ICU  Goals of Care:  Last date of multidisciplinary goals of care discussion: Family and staff present: Summary of discussion: 1/11 Follow up goals of care discussion due:  Code Status: Full code   Critical care time: 30 minutes    Montey Hora, [place] For pager details, please see AMION 05/01/2020, 10:03 AM

## 2020-05-01 NOTE — ED Notes (Signed)
Paged Neurosurgery for MD

## 2020-05-01 NOTE — Progress Notes (Signed)
PCCM Evening round PROGRESS NOTE   Evaluated patient in ED. Patient is in no distress and reports headache has greatly improved. He is requesting to eat. Seeing as no plans for surgery I will order a diet.    Joneen Roach, AGACNP-BC [facility]  See Amion for personal pager PCCM on call pager [phone]  05/01/2020 7:42 PM

## 2020-05-01 NOTE — ED Notes (Signed)
Pt standing at edge of bed, cardiac monitor disconnected. Pt had been eating a meal, vomited food particle yellow emesis. C/o headache and nausea. Pt cleaned up, new linens, belongings placed in patient bag.

## 2020-05-01 NOTE — ED Notes (Signed)
Continues to c/o headache-- will give ativan -- hx of drinking "lots" of etoh daily-- CIWA has been 2,

## 2020-05-02 ENCOUNTER — Inpatient Hospital Stay (HOSPITAL_COMMUNITY): Payer: Medicare Other

## 2020-05-02 DIAGNOSIS — F1010 Alcohol abuse, uncomplicated: Secondary | ICD-10-CM | POA: Diagnosis not present

## 2020-05-02 DIAGNOSIS — I609 Nontraumatic subarachnoid hemorrhage, unspecified: Secondary | ICD-10-CM | POA: Diagnosis not present

## 2020-05-02 LAB — CBC
HCT: 35.5 % — ABNORMAL LOW (ref 39.0–52.0)
Hemoglobin: 12.6 g/dL — ABNORMAL LOW (ref 13.0–17.0)
MCH: 35.7 pg — ABNORMAL HIGH (ref 26.0–34.0)
MCHC: 35.5 g/dL (ref 30.0–36.0)
MCV: 100.6 fL — ABNORMAL HIGH (ref 80.0–100.0)
Platelets: 127 10*3/uL — ABNORMAL LOW (ref 150–400)
RBC: 3.53 MIL/uL — ABNORMAL LOW (ref 4.22–5.81)
RDW: 13.3 % (ref 11.5–15.5)
WBC: 9.3 10*3/uL (ref 4.0–10.5)
nRBC: 0 % (ref 0.0–0.2)

## 2020-05-02 LAB — BASIC METABOLIC PANEL
Anion gap: 15 (ref 5–15)
BUN: 20 mg/dL (ref 8–23)
CO2: 23 mmol/L (ref 22–32)
Calcium: 9.2 mg/dL (ref 8.9–10.3)
Chloride: 102 mmol/L (ref 98–111)
Creatinine, Ser: 1.32 mg/dL — ABNORMAL HIGH (ref 0.61–1.24)
GFR, Estimated: 58 mL/min — ABNORMAL LOW (ref >60)
Glucose, Bld: 113 mg/dL — ABNORMAL HIGH (ref 70–99)
Potassium: 4.4 mmol/L (ref 3.5–5.1)
Sodium: 140 mmol/L (ref 135–145)

## 2020-05-02 LAB — PHOSPHORUS: Phosphorus: 4.1 mg/dL (ref 2.5–4.6)

## 2020-05-02 LAB — MRSA PCR SCREENING: MRSA by PCR: NEGATIVE

## 2020-05-02 LAB — MAGNESIUM: Magnesium: 1.9 mg/dL (ref 1.7–2.4)

## 2020-05-02 MED ORDER — METOCLOPRAMIDE HCL 5 MG/ML IJ SOLN
10.0000 mg | Freq: Four times a day (QID) | INTRAMUSCULAR | Status: AC
  Administered 2020-05-02 (×2): 10 mg via INTRAVENOUS
  Filled 2020-05-02 (×2): qty 2

## 2020-05-02 MED ORDER — ACETAMINOPHEN 325 MG PO TABS
650.0000 mg | ORAL_TABLET | Freq: Four times a day (QID) | ORAL | Status: DC | PRN
  Administered 2020-05-02 – 2020-05-07 (×6): 650 mg via ORAL
  Filled 2020-05-02 (×6): qty 2

## 2020-05-02 MED ORDER — LACTATED RINGERS IV SOLN: INTRAVENOUS | Status: DC

## 2020-05-02 MED ORDER — CHLORHEXIDINE GLUCONATE CLOTH 2 % EX PADS
6.0000 | MEDICATED_PAD | Freq: Every day | CUTANEOUS | Status: DC
  Administered 2020-05-02 – 2020-05-14 (×4): 6 via TOPICAL

## 2020-05-02 MED ORDER — RINGERS IV SOLN: INTRAVENOUS | Status: DC

## 2020-05-02 MED ORDER — PANTOPRAZOLE SODIUM 40 MG PO TBEC
40.0000 mg | DELAYED_RELEASE_TABLET | Freq: Every day | ORAL | Status: DC
  Administered 2020-05-03 – 2020-05-10 (×8): 40 mg via ORAL
  Filled 2020-05-02 (×9): qty 1

## 2020-05-02 NOTE — ED Notes (Signed)
Patients sister Vernona Rieger [phone] would like a callback with an update if possible

## 2020-05-02 NOTE — Progress Notes (Signed)
NAME:  Jeffrey Acosta, MRN:  [id number], DOB:  February 12, 1951, LOS: 2 ADMISSION DATE:  04/30/2020, CONSULTATION DATE:  1/4 REFERRING MD:  Dr. Dalene Seltzer, CHIEF COMPLAINT:  SAH   Brief History:  70 year old male admitted 1/4 with SAH after collapsing at home with loss of consciousness.  Found to have subarachnoid hemorrhage, CT angios negative for aneurysm or AV malformation.  Likely perimesencephalic SAH  Past Medical History:   has a past medical history of Alcoholism in remission (HCC), Arrhythmia, Bipolar affective disorder (HCC), Diverticulosis of colon (without mention of hemorrhage), GERD (gastroesophageal reflux disease), Gout of multiple sites, Hiatal hernia, migraines, Hydrocele, HYPERLIPIDEMIA (01/10/2009), Hypothyroidism, Melanoma of skin, site unspecified (11/25/2012), Osteopenia, and Polyarthralgia.   Significant Hospital Events:  1/4 admit  Consults:  Neurosurgery  Procedures:    Significant Diagnostic Tests:  CT head 1/4 > Moderate to large amount of acute subarachnoid /extra-axial hemorrhage, most heavily concentrated at the suprasellar cistern, and anterior to the brainstem. Findings felt consistent with ruptured aneurysm. Additional foci of extra-axial blood within the posterior fossa, left greater than right middle cranial fossa, and anterior inter hemispheric region. Small amount of bilateral intraventricular and fourth ventricular hemorrhage. Suspected left anterior subdural hygroma as well.  CT C-spine 1/4 > multilevel cervical spondylosis. No acute fracture.  CTA head/neck 1/4 > negative for aneurysm or other vascular abnormality.  No LVO.  Age indeterminate compression deformities at the superior endplates of C7, T3, T4.  CT head 1/6: Unchanged subarachnoid hemorrhage. Minimal intraventricular reflux. The ventricles measure a few millimeters larger than yesterday, but no ventriculomegaly  Micro Data:  COVID 1/4 > neg. Flu 1/4 > neg. MRSA PCR 1/6 >  negative  Antimicrobials:     Interim History / Subjective:  Last night patient complained of inability to open left eye, pupil was dilated and minimally reactive associated with left facial palsy.  Stat head CT was done which showed no new changes  Objective   Blood pressure (!) 108/56, pulse 78, temperature 97.9 F (36.6 C), temperature source Oral, resp. rate 17, height 6\' 1"  (1.854 m), weight 95.3 kg, SpO2 100 %.        Intake/Output Summary (Last 24 hours) at 05/02/2020 1044 Last data filed at 05/02/2020 0400 Gross per 24 hour  Intake 387.03 ml  Output --  Net 387.03 ml   Filed Weights   04/30/20 2021  Weight: 95.3 kg    Examination: General: Elderly Caucasian male, lying on the bed Neuro: Awake but little confused.  All 4 extremities are antigravity.  Positive cough, gag bilateral pupils are reactive to light.  Has left facial droop HEENT: [place]/AT. Sclerae anicteric. EOMI. Cardiovascular: RRR, no M/R/G.  Lungs: Respirations even and unlabored.  CTA bilaterally, No W/R/R. Abdomen: BS x 4, soft, NT/ND.  Musculoskeletal: No gross deformities, no edema.  Skin: Intact, warm, no rashes.   Assessment & Plan:   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) Patient had new neurological finding with left facial nerve palsy and last night he had left 3rd nerve palsy Currently his pupil is reactive but continue to have left facial palsy Neurosurgery is following, recommend watchful waiting Currently no plan for angiogram Continue neuro watch every hour Keep SBP < 140 mmHG, as needed labetalol Continue keppra for seizure prophylaxis for total of 7 days No role nimodipine as non-aneurysmal SAH  Alcohol abuse: Concern for withdrawal Currently no signs of withdrawal Continue thiamine, folate CIWA protocol with ativan He looks a dry we will give him 1  L of LR bolus  Chronic thrombocytopenia No signs of active bleeding Closely monitor   Bipolar  disorder Hold Lamictal and Seroquel for now resume at discharge  Hypothyroidism Continue home sythroid  Hyperlipidemia Continue atorvastatin  GERD/ Barrett's esophagus  Continue Protonix    Hypomagnesemia Electrolytes are being supplemented  Best practice (evaluated daily)  Diet: Heart healthy Pain/Anxiety/Delirium protocol (if indicated): Morphine PRN, CIWA ativan VAP protocol (if indicated): NA DVT prophylaxis: SCD GI prophylaxis: PPI Glucose control: NA Mobility: As tolerated/PT/OT evaluation Disposition: ICU  Goals of Care:  Last date of multidisciplinary goals of care discussion: 1/6 Family and staff present: Patient and his RN Summary of discussion: Continue aggressive care Follow up goals of care discussion due: 1/13 Code Status: Full code   Total critical care time: 38 minutes  Performed by: [place] care time was exclusive of separately billable procedures and treating other patients.   Critical care was necessary to treat or prevent imminent or life-threatening deterioration.   Critical care was time spent personally by me on the following activities: development of treatment plan with patient and/or surrogate as well as nursing, discussions with consultants, evaluation of patient's response to treatment, examination of patient, obtaining history from patient or surrogate, ordering and performing treatments and interventions, ordering and review of laboratory studies, ordering and review of radiographic studies, pulse oximetry and re-evaluation of patient's condition.   Jacky Kindle MD [facility] Pager: [phone] Mobile: [phone]

## 2020-05-02 NOTE — Progress Notes (Signed)
Patient ID: Jeffrey Acosta, male   DOB: [birth year], 70 y.o.   MRN: [id number] Vital signs are stable Patient complains of moderate headache Episode of 3rd nerve palsy with left facial weakness noted from yesterday but now appears to be completely resolved Level of consciousness is good If he remains stable Can likely transfer to the floor

## 2020-05-02 NOTE — Progress Notes (Signed)
On initial assessment of pt, both pupils were noted to be 6 mm non reactive. Pt also had a dysconjugate gaze and nystagmus in the L eye. Pt MAE and is AO x 4. Dr. Wynona Neat notified and at bedside. STAT CT ordered.

## 2020-05-02 NOTE — Progress Notes (Signed)
CTA results noted  Continue to monitor closely with neuro checks

## 2020-05-02 NOTE — TOC CAGE-AID Note (Signed)
Transition of Care [facility]) - CAGE-AID Screening   Patient Details  Name: Jeffrey Acosta MRN: [id number] Date of Birth: 1950/05/30  [facility] CM/SW Contact:    Joanne Chars, LCSW Phone Number: 05/02/2020, 3:00 PM   Clinical Narrative:CSW met with pt for Cage-Aid screening.  Pt acknowledges drinking 3x week, usually 4-5 "airplane bottles" of liquor.  Pt denies illegal drug use. (consistant with tox screen)  Pt went on to describe long history of alcohol abuse but also 18 years sobriety from 1990-2008.  Pt currently active with multiple AA meetings each week, does report he has a sponsor, and has been through residential treatment multiple times, most recently [facility] a year ago.  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  Pt given resource contact list outpt and residential.  Pt considering next needed move to reestablish sobriety and very aware of treatment/resource options to assist him in this.    CAGE-AID Screening:    Have You Ever Felt You Ought to Cut Down on Your Drinking or Drug Use?: Yes Have People Annoyed You By Critizing Your Drinking Or Drug Use?: Yes Have You Felt Bad Or Guilty About Your Drinking Or Drug Use?: Yes Have You Ever Had a Drink or Used Drugs First Thing In The Morning to Steady Your Nerves or to Get Rid of a Hangover?: Yes CAGE-AID Score: 4  Substance Abuse Education Offered: Yes  Substance abuse interventions: Patient Counseling,Other (must comment) (resoure list)

## 2020-05-02 NOTE — ED Notes (Signed)
Pt wife updated via phone on status and plan

## 2020-05-02 NOTE — Progress Notes (Signed)
Pt belongings on admission include: cellphone, sweatpants, white shirt, bedroom shoes, robe. NO wallet or cash.

## 2020-05-02 NOTE — Evaluation (Signed)
Physical Therapy Evaluation Patient Details Name: Jeffrey Acosta MRN: [id number] DOB: 1950/07/30 Today's Date: 05/02/2020   History of Present Illness  The pt is a 70 yo male presenting from home after a fall at home. 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 PMH includes: alcohol use, bipolar affective disorder,  hypothyroidism, atrial tachycardia.    Clinical Impression  Pt in bed upon arrival of PT, agreeable to evaluation at this time. Prior to admission the pt was independent with mobility and ADLs, living with his wife in a home with 3 steps to enter. The pt now presents with limitations in functional mobility, endurance, and dynamic stability due to above dx, and will continue to benefit from skilled PT to address these deficits. The pt was able to complete initial bed mobility without assist, and complete initial sit-stand transfers and hallway ambulation with minA to steady or UE support with minG. The pt also had onset of R foot drop with fatigue, but was able to correct with increased cues and attention. The pt will continue to benefit from skilled PT to progress independence and stability with transfers as well as with gait and stair training to allow for safe d/c home with family support.      Follow Up Recommendations Outpatient PT;Supervision/Assistance - 24 hour    Equipment Recommendations  3in1 (PT);Rolling walker with 5" wheels (tub bench)    Recommendations for Other Services       Precautions / Restrictions Precautions Precautions: Fall Restrictions Weight Bearing Restrictions: No      Mobility  Bed Mobility Overal bed mobility: Modified Independent             General bed mobility comments: increased time and use of bed rails, but no assist    Transfers Overall transfer level: Needs assistance Equipment used: 1 person hand held assist Transfers: Sit to/from Stand Sit to Stand: Min assist          General transfer comment: minA to steady in standing, but no assist needed to power up. slight A-P rocking/lean with continued stace without UE suport  Ambulation/Gait Ambulation/Gait assistance: Min guard;Min assist Gait Distance (Feet): 150 Feet Assistive device: None;IV Pole Gait Pattern/deviations: Step-to pattern;Step-through pattern;Decreased stride length;Decreased dorsiflexion - right Gait velocity: decreased   General Gait Details: pt initially with step-to pattern with minA to steady or UE support on Iv pole with minG. able to progress to minG without UE support and step through pattern, but onset of R foot drop with fatigue   Modified Rankin (Stroke Patients Only) Modified Rankin (Stroke Patients Only) Pre-Morbid Rankin Score: No symptoms Modified Rankin: Moderately severe disability     Balance Overall balance assessment: Needs assistance Sitting-balance support: Single extremity supported Sitting balance-Leahy Scale: Fair     Standing balance support: No upper extremity supported Standing balance-Leahy Scale: Fair Standing balance comment: no LOB with gait without UE support                             Pertinent Vitals/Pain Pain Assessment: No/denies pain    Home Living Family/patient expects to be discharged to:: Private residence Living Arrangements: Spouse/significant other Available Help at Discharge: Family;Available 24 hours/day Type of Home: House Home Access: Stairs to enter Entrance Stairs-Rails: Can reach both Entrance Stairs-Number of Steps: 3 Home Layout: Able to live on main level with bedroom/bathroom Home Equipment: Hand held shower head;Grab bars - tub/shower Additional  Comments: wife present to confirm home set up    Prior Function Level of Independence: Independent         Comments: pt reports multiple falls reccently, but wife states she does not know of any of the falls pt describes.     Hand Dominance         Extremity/Trunk Assessment   Upper Extremity Assessment Upper Extremity Assessment: Overall WFL for tasks assessed    Lower Extremity Assessment Lower Extremity Assessment: Overall WFL for tasks assessed (pt denies difference in sensation bilaterally, slight R foot drop with fatigue)    Cervical / Trunk Assessment Cervical / Trunk Assessment: Normal  Communication   Communication: HOH  Cognition Arousal/Alertness: Awake/alert Behavior During Therapy: WFL for tasks assessed/performed Overall Cognitive Status: Impaired/Different from baseline Area of Impairment: Attention;Memory;Safety/judgement;Problem solving                   Current Attention Level: Focused Memory: Decreased short-term memory   Safety/Judgement: Decreased awareness of deficits;Decreased awareness of safety   Problem Solving: Slow processing;Difficulty sequencing;Requires verbal cues General Comments: pt pleasant and agreeable to education, generally accurate reporting of history, but with slightly decreased insight to safety, deficits, and energy conservation.      General Comments General comments (skin integrity, edema, etc.): VSS on RA    Exercises     Assessment/Plan    PT Assessment Patient needs continued PT services  PT Problem List Decreased strength;Decreased balance;Decreased mobility;Decreased coordination;Decreased knowledge of use of DME;Decreased safety awareness       PT Treatment Interventions DME instruction;Gait training;Stair training;Functional mobility training;Therapeutic activities;Therapeutic exercise;Balance training;Neuromuscular re-education;Patient/family education    PT Goals (Current goals can be found in the Care Plan section)  Acute Rehab PT Goals Patient Stated Goal: return home PT Goal Formulation: With patient Time For Goal Achievement: 05/16/20 Potential to Achieve Goals: Good    Frequency Min 4X/week    AM-PAC PT "6 Clicks" Mobility  Outcome  Measure Help needed turning from your back to your side while in a flat bed without using bedrails?: None Help needed moving from lying on your back to sitting on the side of a flat bed without using bedrails?: None Help needed moving to and from a bed to a chair (including a wheelchair)?: A Little Help needed standing up from a chair using your arms (e.g., wheelchair or bedside chair)?: A Little Help needed to walk in hospital room?: A Little Help needed climbing 3-5 steps with a railing? : A Little 6 Click Score: 20    End of Session Equipment Utilized During Treatment: Gait belt Activity Tolerance: Patient tolerated treatment well Patient left: in chair;with call bell/phone within reach;with chair alarm set;with nursing/sitter in room;with family/visitor present Nurse Communication: Mobility status PT Visit Diagnosis: Unsteadiness on feet (R26.81);Other abnormalities of gait and mobility (R26.89)    Time: [id number] PT Time Calculation (min) (ACUTE ONLY): 27 min   Charges:   PT Evaluation $PT Eval Moderate Complexity: 1 Mod PT Treatments $Gait Training: 8-22 mins        Karma Ganja, PT, DPT   Acute Rehabilitation Department Pager #: [phone]  Otho Bellows 05/02/2020, 2:44 PM

## 2020-05-02 NOTE — Progress Notes (Signed)
Called to see patient  Disconjugate gaze -New neurological finding  Not able to open his eyes Pupils about 5 mm  Awake Following commands Moving all extremities  Obtain a stat head CT to assess for extension/change

## 2020-05-03 DIAGNOSIS — I609 Nontraumatic subarachnoid hemorrhage, unspecified: Secondary | ICD-10-CM | POA: Diagnosis not present

## 2020-05-03 MED ORDER — ACETAMINOPHEN-CODEINE #3 300-30 MG PO TABS
1.0000 | ORAL_TABLET | Freq: Four times a day (QID) | ORAL | Status: DC | PRN
  Administered 2020-05-03 – 2020-05-04 (×3): 1 via ORAL
  Filled 2020-05-03 (×3): qty 1

## 2020-05-03 MED ORDER — LEVETIRACETAM 500 MG PO TABS
1000.0000 mg | ORAL_TABLET | Freq: Two times a day (BID) | ORAL | Status: AC
  Administered 2020-05-03 – 2020-05-07 (×9): 1000 mg via ORAL
  Filled 2020-05-03 (×9): qty 2

## 2020-05-03 MED ORDER — ONDANSETRON HCL 4 MG/2ML IJ SOLN
4.0000 mg | Freq: Three times a day (TID) | INTRAMUSCULAR | Status: DC | PRN
  Administered 2020-05-03 – 2020-05-06 (×4): 4 mg via INTRAVENOUS
  Filled 2020-05-03 (×4): qty 2

## 2020-05-03 MED ORDER — QUETIAPINE FUMARATE 200 MG PO TABS
200.0000 mg | ORAL_TABLET | Freq: Every day | ORAL | Status: DC
  Administered 2020-05-03 – 2020-05-10 (×8): 200 mg via ORAL
  Filled 2020-05-03 (×8): qty 1

## 2020-05-03 MED ORDER — LAMOTRIGINE 25 MG PO TABS
25.0000 mg | ORAL_TABLET | Freq: Every day | ORAL | Status: DC
  Administered 2020-05-03 – 2020-05-10 (×8): 25 mg via ORAL
  Filled 2020-05-03 (×8): qty 1

## 2020-05-03 NOTE — Progress Notes (Signed)
**Note Jeffrey-Identified via Obfuscation** NAME:  Jeffrey Acosta, MRN:  [id number], DOB:  [birth year], LOS: 3 ADMISSION DATE:  04/30/2020, CONSULTATION DATE:  1/4 REFERRING MD:  Dr. Billy Fischer, CHIEF COMPLAINT:  SAH   Brief History:  70 year old male admitted 1/4 with SAH after collapsing at home with loss of consciousness.  Found to have subarachnoid hemorrhage, CT angios negative for aneurysm or AV malformation.  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   Significant Hospital Events:  1/4 admit 1/6 complained of inability to open left eye, pupil was dilated and minimally reactive associated with left III n palsy.  Stat head CT was done which showed no new changes  Consults:  Neurosurgery  Procedures:    Significant Diagnostic Tests:  CT head 1/4 > Moderate to large amount of acute subarachnoid /extra-axial hemorrhage, most heavily concentrated at the suprasellar cistern, and anterior to the brainstem. Findings felt consistent with ruptured aneurysm. Additional foci of extra-axial blood within the posterior fossa, left greater than right middle cranial fossa, and anterior inter hemispheric region. Small amount of bilateral intraventricular and fourth ventricular hemorrhage. Suspected left anterior subdural hygroma as well.  CT C-spine 1/4 > multilevel cervical spondylosis. No acute fracture.  CTA head/neck 1/4 > negative for aneurysm or other vascular abnormality.  No LVO.  Age indeterminate compression deformities at the superior endplates of C7, T3, T4.  CT head 1/6: Unchanged subarachnoid hemorrhage. Minimal intraventricular reflux. The  ventricles measure a few millimeters larger than yesterday, but no ventriculomegaly  Micro Data:  COVID 1/4 > neg. Flu 1/4 > neg. MRSA PCR 1/6 > negative  Antimicrobials:     Interim History / Subjective:   Complains of headache right frontal and occipital. Received 2 doses of morphine overnight, now headache is back Afebrile, Normotensive  Objective   Blood pressure (!) 152/75, pulse (!) 59, temperature 98.2 F (36.8 C), temperature source Oral, resp. rate 12, height 6\' 1"  (1.854 m), weight 95.3 kg, SpO2 100 %.        Intake/Output Summary (Last 24 hours) at 05/03/2020 0827 Last data filed at 05/03/2020 0725 Gross per 24 hour  Intake 2060.95 ml  Output 725 ml  Net 1335.95 ml   Filed Weights   04/30/20 2021  Weight: 95.3 kg    Examination: Gen:      elderly man, no distress  HEENT:  EOMI, sclera anicteric, mild pallor Neck:     No JVD; no thyromegaly Lungs:    Clear breath sounds CV:         Regular rate and rhythm; no murmurs Abd:      + bowel sounds; soft, non-tender; no palpable masses, no distension Ext:    No edema; adequate peripheral perfusion Skin:      Warm and dry; no rash Neuro: alert and oriented x 3 , pupils equal and reactive, no ptosis, minimal left facial droop, power 5/5 all 4 extremities  Labs show normal electrolytes, slight increase in creatinine from 1.1-1.3, mild thrombocytopenia  Assessment & Plan:   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) 1/6 developed transient neurological finding with left 3rd nerve palsy, now resolved  Keep SBP < 140 mmHG, as needed labetalol Continue keppra for seizure prophylaxis for total of 7 days  No role nimodipine as non-aneurysmal SAH Neurosurgery following Tylenol 3 as needed headache  Alcohol abuse: Currently no signs of withdrawal Continue thiamine, folate CIWA protocol with ativan Continue IV fluids at 50 cc/h  Chronic thrombocytopenia No signs of active bleeding   Bipolar  disorder Hold Lamictal and Seroquel for now resume at discharge  Hypothyroidism Continue home sythroid  Hyperlipidemia Continue atorvastatin  GERD/ Barrett's esophagus  Continue Protonix    Hypomagnesemia -Has been repleted  Best practice (evaluated daily)  Diet: Heart healthy Pain/Anxiety/Delirium protocol (if indicated): Morphine PRN, CIWA ativan VAP protocol (if indicated): NA DVT prophylaxis: SCD GI prophylaxis: PPI Glucose control: NA Mobility: As tolerated/PT/OT evaluation Disposition: Transfer to floor and to triad  Goals of Care:  Last date of multidisciplinary goals of care discussion: 1/6 Family and staff present: Patient and his RN Summary of discussion: Continue aggressive care Follow up goals of care discussion due: 1/13 Code Status: Full code   Kara Mead MD. FCCP. [facility] See Amion for pager  If no response to pager , please call [phone]  After 7:00 pm call Elink  [phone]   05/03/2020

## 2020-05-03 NOTE — Evaluation (Signed)
Occupational Therapy Evaluation Patient Details Name: Jeffrey Acosta MRN: [id number] DOB: [birth year] Today's Date: 05/03/2020    History of Present Illness The pt is a 70 yo male presenting from home after a fall at home. 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 PMH includes: alcohol use, bipolar affective disorder,  hypothyroidism, atrial tachycardia.   Clinical Impression   PT admitted with s/p fall with SAH. Pt currently with functional limitiations due to the deficits listed below (see OT problem list). Pt currently with dynamic balance deficits and will be further challenged by OT for adls Pt will benefit from skilled OT to increase their independence and safety with adls and balance to allow discharge outpatient.     Follow Up Recommendations  Outpatient OT    Equipment Recommendations  None recommended by OT    Recommendations for Other Services       Precautions / Restrictions Precautions Precautions: Fall      Mobility Bed Mobility Overal bed mobility: Modified Independent                  Transfers Overall transfer level: Modified independent                    Balance Overall balance assessment: Needs assistance   Sitting balance-Leahy Scale: Good       Standing balance-Leahy Scale: Good                             ADL either performed or assessed with clinical judgement   ADL Overall ADL's : Needs assistance/impaired     Grooming: Wash/dry hands;Wash/dry face;Oral care;Supervision/safety;Standing               Lower Body Dressing: Modified independent;Sit to/from stand   Toilet Transfer: Supervision/safety           Functional mobility during ADLs: Supervision/safety General ADL Comments: pt prefer to lay down currenlty due to HA at eyes and base of head/ neck     Vision Baseline Vision/History: Wears glasses Wears Glasses: At all times       Perception      Praxis      Pertinent Vitals/Pain Pain Assessment: Faces Faces Pain Scale: Hurts even more Pain Location: hea Pain Descriptors / Indicators: Headache Pain Intervention(s): Monitored during session;Repositioned     Hand Dominance Right   Extremity/Trunk Assessment Upper Extremity Assessment Upper Extremity Assessment: Overall WFL for tasks assessed   Lower Extremity Assessment Lower Extremity Assessment: Defer to PT evaluation   Cervical / Trunk Assessment Cervical / Trunk Assessment: Normal   Communication Communication Communication: HOH   Cognition Arousal/Alertness: Awake/alert Behavior During Therapy: WFL for tasks assessed/performed Overall Cognitive Status: Within Functional Limits for tasks assessed                                 General Comments: able to give details to deficits and reason for admission. pt reports concerns over weak legs currently   General Comments  VSS    Exercises     Shoulder Instructions      Home Living Family/patient expects to be discharged to:: Private residence Living Arrangements: Spouse/significant other Available Help at Discharge: Family;Available 24 hours/day Type of Home: House Home Access: Stairs to enter [employer] of Steps: 3 Entrance Stairs-Rails: Can reach both Home Layout: Able to  live on main level with bedroom/bathroom     Bathroom Shower/Tub: Teacher, early years/pre: Standard Bathroom Accessibility: Yes   Home Equipment: Hand held shower head;Grab bars - tub/shower   Additional Comments: pt confirmed      Prior Functioning/Environment Level of Independence: Independent                 OT Problem List: Decreased activity tolerance;Impaired balance (sitting and/or standing);Decreased safety awareness;Decreased knowledge of use of DME or AE;Decreased knowledge of precautions;Pain      OT Treatment/Interventions: Self-care/ADL training;Therapeutic  exercise;Energy conservation;DME and/or AE instruction;Manual therapy;Therapeutic activities;Patient/family education;Balance training    OT Goals(Current goals can be found in the care plan section) Acute Rehab OT Goals Patient Stated Goal: to be able to go home OT Goal Formulation: With patient Time For Goal Achievement: 05/15/20 Potential to Achieve Goals: Good  OT Frequency: Min 2X/week   Barriers to D/C:            Co-evaluation              AM-PAC OT "6 Clicks" Daily Activity     Outcome Measure Help from another person eating meals?: None Help from another person taking care of personal grooming?: A Little Help from another person toileting, which includes using toliet, bedpan, or urinal?: A Little Help from another person bathing (including washing, rinsing, drying)?: A Little Help from another person to put on and taking off regular upper body clothing?: A Little Help from another person to put on and taking off regular lower body clothing?: A Little 6 Click Score: 19   End of Session Nurse Communication: Mobility status;Precautions  Activity Tolerance: Patient tolerated treatment well Patient left: in bed;with call bell/phone within reach;with bed alarm set  OT Visit Diagnosis: Unsteadiness on feet (R26.81)                Time: 8768-1157 OT Time Calculation (min): 23 min Charges:  OT General Charges $OT Visit: 1 Visit OT Evaluation $OT Eval Moderate Complexity: 1 Mod   Brynn, OTR/L  Acute Rehabilitation Services Pager: [phone] Office: [phone] .   Jeri Modena 05/03/2020, 3:51 PM

## 2020-05-03 NOTE — Progress Notes (Signed)
PT has been admitted to the unit via wheelchair. Pt does report pain and was given comfort measures. IV and equipment are all intact. Pt sent all belongings and pt has call light and telephone in hand.  05/03/20 1433  Vitals  Temp (!) 97.5 F (36.4 C)  Temp Source Oral  BP (!) 150/86  MAP (mmHg) 106  BP Location Right Arm  BP Method Automatic  Patient Position (if appropriate) Lying  Pulse Rate 71  Pulse Rate Source Dinamap  Resp 18  Level of Consciousness  Level of Consciousness Alert  MEWS COLOR  MEWS Score Color Green  Oxygen Therapy  SpO2 98 %  O2 Device Room Air  Pain Assessment  Pain Scale 0-10  Pain Score 6  Pain Type Acute pain  Pain Location Head  Pain Orientation Upper  Pain Descriptors / Indicators Discomfort;Aching  Patients Stated Pain Goal 0  Pain Intervention(s) Medication (See eMAR)  MEWS Score  MEWS Temp 0  MEWS Systolic 0  MEWS Pulse 0  MEWS RR 0  MEWS LOC 0  MEWS Score 0

## 2020-05-03 NOTE — Progress Notes (Signed)
Patient ID: Jeffrey Acosta, male   DOB: [birth year], 70 y.o.   MRN: [id number] Patient's vital signs are stable He has not had any other episodes of cranial nerve palsy since the one episode a couple of days ago He notes he still has substantial headache He is mobilizing well I believe he can be discharged home soon to continue his recuperation there His IV fluid has been cut down and his IVs likely can be DC'd at this time I will follow-up with him on an outpatient basis in about 2 weeks.

## 2020-05-03 NOTE — TOC Initial Note (Signed)
Transition of Care [facility]) - Initial/Assessment Note    Patient Details  Name: Jeffrey Acosta MRN: [id number] Date of Birth: November 09, 1950  Transition of Care [facility] CM/SW Contact:    Pollie Friar, RN Phone Number: 05/03/2020, 4:26 PM  Clinical Narrative:                 Recommendations are for Outpatient therapy. CM met with the patient and his spouse. They are in agreement with [facility]. Orders in Epic and information on the AVS.  3 in 1 and walker ordered but will need to be delivered to the pts room. Pt has transport home when medically ready.   Expected Discharge Plan: OP Rehab Barriers to Discharge: Continued Medical Work up   Patient Goals and CMS Choice   CMS Medicare.gov Compare Post Acute Care list provided to:: Patient Choice offered to / list presented to : [facility]  Expected Discharge Plan and Services Expected Discharge Plan: OP Rehab   Discharge Planning Services: CM Consult   Living arrangements for the past 2 months: Single Family Home                                      Prior Living Arrangements/Services Living arrangements for the past 2 months: Single Family Home Lives with:: Spouse Patient language and need for interpreter reviewed:: Yes Do you feel safe going back to the place where you live?: Yes      Need for Family Participation in Patient Care: Yes (Comment) Care giver support system in place?: Yes (comment)   Criminal Activity/Legal Involvement Pertinent to Current Situation/Hospitalization: No - Comment as needed  Activities of Daily Living Home Assistive Devices/Equipment: None ADL Screening (condition at time of admission) Patient's cognitive ability adequate to safely complete daily activities?: Yes Is the patient deaf or have difficulty hearing?: No Does the patient have difficulty seeing, even when wearing glasses/contacts?: No Does the patient have difficulty concentrating, remembering, or making decisions?:  No Patient able to express need for assistance with ADLs?: Yes Does the patient have difficulty dressing or bathing?: No Independently performs ADLs?: Yes (appropriate for developmental age) Does the patient have difficulty walking or climbing stairs?: No Weakness of Legs: None Weakness of Arms/Hands: None  Permission Sought/Granted                  Emotional Assessment Appearance:: Appears stated age Attitude/Demeanor/Rapport: Engaged Affect (typically observed): Accepting Orientation: : Oriented to Self,Oriented to Place   Psych Involvement: No (comment)  Admission diagnosis:  Subarachnoid hemorrhage (HCC) [I60.9] SAH (subarachnoid hemorrhage) ([place]) [I60.9] Patient Active Problem List   Diagnosis Date Noted  . Subarachnoid hemorrhage ([place]) 04/30/2020  . Leukopenia 07/15/2018  . Thrombocytopenia ([place]) 07/15/2018  . PCP NOTES >>>>>>> 03/23/2015  . Annual physical exam 03/20/2015  . Bronchospasm 09/06/2014  . DJD (degenerative joint disease) 09/06/2014  . Hypothyroidism 08/28/2014  . Tremor 07/29/2014  . Gait disorder 07/29/2014  . Gout 01/08/2014  . Vitamin D deficiency 11/25/2012  . Melanoma of skin, site unspecified 11/25/2012  . 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 Alcoholism in remission ([place]) 06/19/2011  . Diverticulitis of colon (without mention of hemorrhage)(562.11) 06/19/2011  . Bipolar I disorder ([place]) 06/19/2011  . Osteopenia 05/17/2010  . Alcohol abuse 05/16/2010  . HYPERTRIGLYCERIDEMIA 08/02/2009  . Macrocytic anemia 01/10/2009  . ABNORMAL ELECTROCARDIOGRAM 11/26/2008  . REFLUX, ESOPHAGEAL 11/17/2006  . COMMON MIGRAINE 09/22/2006  PCP:  Colon Branch, MD Pharmacy:   [facility] [place], [address] [place] [address] Phone: [phone] Fax: [phone]     Social Determinants of Health (SDOH) Interventions    Readmission Risk  Interventions No flowsheet data found.

## 2020-05-03 NOTE — Progress Notes (Signed)
Physical Therapy Treatment Patient Details Name: Jeffrey Acosta MRN: [id number] DOB: 11-12-1950 Today's Date: 05/03/2020    History of Present Illness The pt is a 70 yo male presenting from home after a fall at home. 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 PMH includes: alcohol use, bipolar affective disorder,  hypothyroidism, atrial tachycardia.    PT Comments    The pt is making good progress with mobility and PT goals this morning, he was able to demo increased ambulation distance, complete stair navigation, and complete higher level balance challenges with minG to minA to maintain stability without UE support at this time. The pt continues to present with some deficits in endurance, coordination, and dynamic stability that require minG or supervision for safety with mobility at this time. The pt was challenged by higher-level balance activities such as tandem walking, backwards walking, and static stance with narrow BOS. The pt will continue to benefit from OPPT to progress functional independence and stability with mobility to reduce risk of falls at home.    Follow Up Recommendations  Outpatient PT;Supervision/Assistance - 24 hour (for balance, LE strengthening)     Equipment Recommendations  Rolling walker with 5" wheels;3in1 (PT) (tub bench)    Recommendations for Other Services       Precautions / Restrictions Precautions Precautions: Fall Restrictions Weight Bearing Restrictions: No    Mobility  Bed Mobility Overal bed mobility: Modified Independent             General bed mobility comments: increased time and use of bed rails, but no assist  Transfers Overall transfer level: Needs assistance Equipment used: None Transfers: Sit to/from Stand Sit to Stand: Min guard         General transfer comment: minG for safety, pt completed x4 through session without assist or UE  support  Ambulation/Gait Ambulation/Gait assistance: Min guard Gait Distance (Feet): 200 Feet Assistive device: None Gait Pattern/deviations: Step-through pattern;Decreased stride length;Drifts right/left;Wide base of support Gait velocity: 0.66 m/s Gait velocity interpretation: <1.8 ft/sec, indicate of risk for recurrent falls General Gait Details: pt with step-through pattern with minG for safety. slight instability with normal gait, able to complete higher level balance challenges without LOB. reports LE fatigue following 100 ft gait   Stairs Stairs: Yes Stairs assistance: Min guard Stair Management: One rail Left;Alternating pattern;Forwards Number of Stairs: 2 General stair comments: alternating pattern with rail, no instability   Wheelchair Mobility    Modified Rankin (Stroke Patients Only) Modified Rankin (Stroke Patients Only) Pre-Morbid Rankin Score: No symptoms Modified Rankin: Moderately severe disability     Balance Overall balance assessment: Needs assistance Sitting-balance support: Single extremity supported Sitting balance-Leahy Scale: Fair     Standing balance support: No upper extremity supported Standing balance-Leahy Scale: Fair Standing balance comment: no LOB with gait without UE support     Tandem Stance - Right Leg: 15 Tandem Stance - Left Leg: 6 Rhomberg - Eyes Opened: 15 Rhomberg - Eyes Closed: 6 (with significant sway) High level balance activites: Backward walking;Direction changes;Turns;Head turns High Level Balance Comments: minG for safety with no LOB for head turns and changes in direction. Pt benefits from minA with backwards walking and tandem walking Standardized Balance Assessment Standardized Balance Assessment : Dynamic Gait Index   Dynamic Gait Index Level Surface: Normal Change in Gait Speed: Moderate Impairment Gait with Horizontal Head Turns: Mild Impairment Gait with Vertical Head Turns: Mild Impairment Gait and Pivot  Turn: Normal Step  Over Obstacle: Mild Impairment Step Around Obstacles: Mild Impairment Steps: Mild Impairment Total Score: 17      Cognition Arousal/Alertness: Awake/alert Behavior During Therapy: WFL for tasks assessed/performed Overall Cognitive Status: Impaired/Different from baseline Area of Impairment: Memory;Safety/judgement;Problem solving                     Memory: Decreased short-term memory   Safety/Judgement: Decreased awareness of deficits;Decreased awareness of safety   Problem Solving: Slow processing;Decreased initiation;Requires verbal cues General Comments: pt pleasant and agreeable through session,  followed all commands, but does benefit from cues for safety. decreased insight to safety and deficits      Exercises      General Comments General comments (skin integrity, edema, etc.): VSS on RA      Pertinent Vitals/Pain Pain Assessment: Faces Faces Pain Scale: Hurts even more Pain Location: headache Pain Descriptors / Indicators: Headache Pain Intervention(s): Limited activity within patient's tolerance;Monitored during session;Premedicated before session;Repositioned           PT Goals (current goals can now be found in the care plan section) Acute Rehab PT Goals Patient Stated Goal: return home PT Goal Formulation: With patient Time For Goal Achievement: 05/16/20 Potential to Achieve Goals: Good Progress towards PT goals: Progressing toward goals    Frequency    Min 4X/week      PT Plan Current plan remains appropriate       AM-PAC PT "6 Clicks" Mobility   Outcome Measure  Help needed turning from your back to your side while in a flat bed without using bedrails?: None Help needed moving from lying on your back to sitting on the side of a flat bed without using bedrails?: None Help needed moving to and from a bed to a chair (including a wheelchair)?: A Little Help needed standing up from a chair using your arms (e.g.,  wheelchair or bedside chair)?: A Little Help needed to walk in hospital room?: A Little Help needed climbing 3-5 steps with a railing? : A Little 6 Click Score: 20    End of Session Equipment Utilized During Treatment: Gait belt Activity Tolerance: Patient tolerated treatment well Patient left: with call bell/phone within reach;in bed Nurse Communication: Mobility status PT Visit Diagnosis: Unsteadiness on feet (R26.81);Other abnormalities of gait and mobility (R26.89)     Time: 2707-8675 PT Time Calculation (min) (ACUTE ONLY): 27 min  Charges:  $Gait Training: 23-37 mins                     Karma Ganja, PT, DPT   Acute Rehabilitation Department Pager #: [phone]   Otho Bellows 05/03/2020, 9:10 AM

## 2020-05-04 ENCOUNTER — Inpatient Hospital Stay (HOSPITAL_COMMUNITY): Payer: Medicare Other

## 2020-05-04 DIAGNOSIS — I10 Essential (primary) hypertension: Secondary | ICD-10-CM | POA: Diagnosis not present

## 2020-05-04 DIAGNOSIS — F319 Bipolar disorder, unspecified: Secondary | ICD-10-CM

## 2020-05-04 DIAGNOSIS — D696 Thrombocytopenia, unspecified: Secondary | ICD-10-CM

## 2020-05-04 DIAGNOSIS — F1010 Alcohol abuse, uncomplicated: Secondary | ICD-10-CM | POA: Diagnosis not present

## 2020-05-04 LAB — PHOSPHORUS: Phosphorus: 3.1 mg/dL (ref 2.5–4.6)

## 2020-05-04 LAB — CBC
HCT: 32.6 % — ABNORMAL LOW (ref 39.0–52.0)
Hemoglobin: 12.2 g/dL — ABNORMAL LOW (ref 13.0–17.0)
MCH: 37.1 pg — ABNORMAL HIGH (ref 26.0–34.0)
MCHC: 37.4 g/dL — ABNORMAL HIGH (ref 30.0–36.0)
MCV: 99.1 fL (ref 80.0–100.0)
Platelets: 119 10*3/uL — ABNORMAL LOW (ref 150–400)
RBC: 3.29 MIL/uL — ABNORMAL LOW (ref 4.22–5.81)
RDW: 13.5 % (ref 11.5–15.5)
WBC: 3.9 10*3/uL — ABNORMAL LOW (ref 4.0–10.5)
nRBC: 0 % (ref 0.0–0.2)

## 2020-05-04 LAB — BASIC METABOLIC PANEL
Anion gap: 12 (ref 5–15)
BUN: 12 mg/dL (ref 8–23)
CO2: 24 mmol/L (ref 22–32)
Calcium: 8.9 mg/dL (ref 8.9–10.3)
Chloride: 103 mmol/L (ref 98–111)
Creatinine, Ser: 1.06 mg/dL (ref 0.61–1.24)
GFR, Estimated: >60 mL/min (ref >60)
Glucose, Bld: 100 mg/dL — ABNORMAL HIGH (ref 70–99)
Potassium: 3.9 mmol/L (ref 3.5–5.1)
Sodium: 139 mmol/L (ref 135–145)

## 2020-05-04 LAB — MAGNESIUM: Magnesium: 1.6 mg/dL — ABNORMAL LOW (ref 1.7–2.4)

## 2020-05-04 MED ORDER — METOPROLOL SUCCINATE ER 100 MG PO TB24: 100.0000 mg | ORAL_TABLET | Freq: Every day | ORAL | Status: DC

## 2020-05-04 MED ORDER — OXYCODONE-ACETAMINOPHEN 7.5-325 MG PO TABS
1.0000 | ORAL_TABLET | Freq: Four times a day (QID) | ORAL | Status: DC | PRN
  Administered 2020-05-04 – 2020-05-05 (×2): 1 via ORAL
  Filled 2020-05-04 (×2): qty 1

## 2020-05-04 MED ORDER — DIPHENHYDRAMINE HCL 50 MG/ML IJ SOLN
25.0000 mg | Freq: Once | INTRAMUSCULAR | Status: AC
  Administered 2020-05-04: 25 mg via INTRAVENOUS
  Filled 2020-05-04: qty 1

## 2020-05-04 MED ORDER — ACETAMINOPHEN-CODEINE #3 300-30 MG PO TABS: 1.0000 | ORAL_TABLET | Freq: Four times a day (QID) | ORAL | 0 refills | Status: DC | PRN

## 2020-05-04 MED ORDER — MAGNESIUM SULFATE 2 GM/50ML IV SOLN
2.0000 g | Freq: Once | INTRAVENOUS | Status: AC
  Administered 2020-05-04: 2 g via INTRAVENOUS
  Filled 2020-05-04: qty 50

## 2020-05-04 MED ORDER — HYDRALAZINE HCL 25 MG PO TABS
25.0000 mg | ORAL_TABLET | Freq: Four times a day (QID) | ORAL | Status: DC | PRN
Start: 2020-05-04 — End: 2020-05-11
  Administered 2020-05-04 – 2020-05-09 (×9): 25 mg via ORAL
  Filled 2020-05-04 (×10): qty 1

## 2020-05-04 MED ORDER — METOPROLOL SUCCINATE ER 50 MG PO TB24
50.0000 mg | ORAL_TABLET | Freq: Every day | ORAL | Status: DC
  Administered 2020-05-04 – 2020-05-10 (×6): 50 mg via ORAL
  Filled 2020-05-04 (×7): qty 1

## 2020-05-04 MED ORDER — LEVETIRACETAM 1000 MG PO TABS: 1000.0000 mg | ORAL_TABLET | Freq: Two times a day (BID) | ORAL | 0 refills | Status: DC

## 2020-05-04 NOTE — Progress Notes (Signed)
PROGRESS NOTE    JAMMAR Acosta  [id number] DOB: [birth year] DOA: 04/30/2020 PCP: Colon Branch, MD    Brief Narrative:  Jeffrey Acosta is a 70 year old male with past medical history significant for EtOH abuse, bipolar affective disorder, diverticulosis, GERD, gout, migraine headache, hyperlipidemia, hypothyroidism, melanoma of skin, osteopenia and polyarthralgia who was admitted on 04/30/2020 with subarachnoid hemorrhage after collapsing at home with loss of consciousness.  CT head notable for subarachnoid hemorrhage.  CT angiogram head negative for aneurysm or AV malformation.  Neurosurgery was consulted and admitted under the PCCM service.  Patient was transferred from the PCCM service to hospital service on 05/04/2020.   Assessment & Plan:   Active Problems:   Alcohol abuse   Subarachnoid hemorrhage (HCC)   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) Patient presenting from home following loss of consciousness.  CT head notable for moderate/large amount of acute subarachnoid/extra-axial hemorrhage mostly concentrated at the suprasellar cistern.  CTA head/neck negative for aneurysm or other vascular abnormality.  Repeat CT head on 05/02/2020 with unchanged subarachnoid hemorrhage with minimal intraventricular reflux.  Patient did develop cranial nerve palsy that resolved and none since initial episode.  Neurosurgery was consulted and followed during hospital course.  No further recommendations from neurosurgery, Dr. Ellene Route and can follow-up outpatient in 2 weeks.  --Change T3 to Vicodin 7.5-325 mg p.o. every 6 hours as needed moderate pain --Morphine 1-2mg  IV as needed severe breakthrough pain --Restarted home metoprolol succinate at reduced dose 51 g p.o. daily --Hydralazine 25 mg p.o. every 6 hours as needed SBP >150 --Discontinued home aspirin  EtOH use disorder Discussed with patient need for complete EtOH cessation. --CIWA protocol with symptom triggered  Ativan --Continue multivitamin, folic acid, thiamine  Essential hypertension: Continue metoprolol succinate 50mg  p.o. daily  Chronic thrombocytopenia Platelet count 119 at time of discharge.  Etiology likely secondary to chronic EtOH use disorder.  Bipolar disorder Continue home Lamictal 25 mg p.o. daily and Seroquel 200 mg p.o. nightly  Hypothyroidism Continue levothyroxine 50 mcg p.o. daily  Hyperlipidemia: Continue atorvastatin 20 mg p.o. daily  GERD/Barrett's esophagus: Continue PPI  Hypomagnesemia: Repleted today   DVT prophylaxis: SCDs, chemical DVT prophylaxis contraindicated in acute SAH Code Status: Full code Family Communication: Patient extensively at bedside  Disposition Plan:  Status is: Inpatient  Remains inpatient appropriate because:Ongoing active pain requiring inpatient pain management   Dispo: The patient is from: Home              Anticipated d/c is to: Home              Anticipated d/c date is: 1 day              Patient currently is not medically stable to d/c.    Consultants:   PCCM -signed off 05/04/20  Neurosurgery signed off 05/03/20  Procedures:   None  Antimicrobials:   None   Subjective: Patient seen and examined at bedside, lying in bed.  Continues with headache.  States unable to go home due to severe 10 out of 10 headache.  PCCM and neurosurgery now signed off.  Neurosurgery recommends outpatient follow-up 2 weeks.  No other complaints or concerns at this time.  Denies visual changes, no chest pain, no palpitations, no shortness of breath, no abdominal pain.  No acute events overnight per nursing staff.  Objective: Vitals:   05/04/20 0431 05/04/20 0813 05/04/20 1215 05/04/20 1510  BP: (!) 168/86 (!) 143/92 (!) 159/86 (!) 179/89  Pulse: 74 71  65 (!) 56  Resp: 17 18 18 18   Temp: (!) 97.5 F (36.4 C) (!) 97.5 F (36.4 C) (!) 97.5 F (36.4 C) (!) 97.4 F (36.3 C)  TempSrc: Oral Oral Oral Oral  SpO2: 100% 99% 100% 100%   Weight:      Height:        Intake/Output Summary (Last 24 hours) at 05/04/2020 1513 Last data filed at 05/04/2020 0050 Gross per 24 hour  Intake 360 ml  Output --  Net 360 ml   Filed Weights   04/30/20 2021  Weight: 95.3 kg    Examination:  General exam: Appears calm and comfortable  Respiratory system: Clear to auscultation. Respiratory effort normal.  Oxygenating well on room air Cardiovascular system: S1 & S2 heard, RRR. No JVD, murmurs, rubs, gallops or clicks. No pedal edema. Gastrointestinal system: Abdomen is nondistended, soft and nontender. No organomegaly or masses felt. Normal bowel sounds heard. Central nervous system: Alert and oriented. No focal neurological deficits. Extremities: Symmetric 5 x 5 power. Skin: No rashes, lesions or ulcers Psychiatry: Judgement and insight appear normal. Mood & affect appropriate.     Data Reviewed: I have personally reviewed following labs and imaging studies  CBC: Recent Labs  Lab 04/30/20 2038 05/01/20 0007 05/01/20 0026 05/01/20 0500 05/02/20 0452 05/04/20 0339  WBC 4.6 5.1  --  3.1* 9.3 3.9*  HGB 12.9* 13.3 12.9* 13.3 12.6* 12.2*  HCT 36.0* 36.5* 38.0* 35.7* 35.5* 32.6*  MCV 100.3* 100.0  --  99.7 100.6* 99.1  PLT 107* 111*  --  106* 127* [id number]*   Basic Metabolic Panel: Recent Labs  Lab 04/30/20 2038 05/01/20 0007 05/01/20 0026 05/01/20 0500 05/02/20 0452 05/04/20 0339  NA 141  --  146* 141 140 139  K 3.8  --  4.1 4.3 4.4 3.9  CL 106  --  105 106 102 103  CO2 23  --   --  21* 23 24  GLUCOSE 93  --  106* 113* 113* 100*  BUN 10  --  11 10 20 12   CREATININE 1.16  --  1.50* 1.10 1.32* 1.06  CALCIUM 9.0  --   --  9.0 9.2 8.9  MG  --  1.5*  --  1.3* 1.9 1.6*  PHOS  --  4.0  --  3.7 4.1 3.1   GFR: Estimated Creatinine Clearance: 72.2 mL/min (by C-G formula based on SCr of 1.06 mg/dL). Liver Function Tests: Recent Labs  Lab 04/30/20 2038  AST 29  ALT 15  ALKPHOS 51  BILITOT 0.9  PROT 5.8*  ALBUMIN 3.6    No results for input(s): LIPASE, AMYLASE in the last 168 hours. No results for input(s): AMMONIA in the last 168 hours. Coagulation Profile: Recent Labs  Lab 05/01/20 0007  INR 1.0   Cardiac Enzymes: No results for input(s): CKTOTAL, CKMB, CKMBINDEX, TROPONINI in the last 168 hours. BNP (last 3 results) No results for input(s): PROBNP in the last 8760 hours. HbA1C: No results for input(s): HGBA1C in the last 72 hours. CBG: Recent Labs  Lab 05/01/20 2040  GLUCAP 81   Lipid Profile: No results for input(s): CHOL, HDL, LDLCALC, TRIG, CHOLHDL, LDLDIRECT in the last 72 hours. Thyroid Function Tests: No results for input(s): TSH, T4TOTAL, FREET4, T3FREE, THYROIDAB in the last 72 hours. Anemia Panel: No results for input(s): VITAMINB12, FOLATE, FERRITIN, TIBC, IRON, RETICCTPCT in the last 72 hours. Sepsis Labs: No results for input(s): PROCALCITON, LATICACIDVEN in the last 168 hours.  Recent Results (  from the past 240 hour(s))  Resp Panel by RT-PCR (Flu A&B, Covid) Nasopharyngeal Swab     Status: None   Collection Time: 05/01/20 12:08 AM   Specimen: Nasopharyngeal Swab; Nasopharyngeal(NP) swabs in vial transport medium  Result Value Ref Range Status   SARS Coronavirus 2 by RT PCR NEGATIVE NEGATIVE Final    Comment: (NOTE) SARS-CoV-2 target nucleic acids are NOT DETECTED.  The SARS-CoV-2 RNA is generally detectable in upper respiratory specimens during the acute phase of infection. The lowest concentration of SARS-CoV-2 viral copies this assay can detect is 138 copies/mL. A negative result does not preclude SARS-Cov-2 infection and should not be used as the sole basis for treatment or other patient management decisions. A negative result may occur with  improper specimen collection/handling, submission of specimen other than nasopharyngeal swab, presence of viral mutation(s) within the areas targeted by this assay, and inadequate number of viral copies(<138 copies/mL). A  negative result must be combined with clinical observations, patient history, and epidemiological information. The expected result is Negative.  Fact Sheet for Patients:  EntrepreneurPulse.com.au  Fact Sheet for Healthcare Providers:  IncredibleEmployment.be  This test is no t yet approved or cleared by the [place] FDA and  has been authorized for detection and/or diagnosis of SARS-CoV-2 by FDA under an Emergency Use Authorization (EUA). This EUA will remain  in effect (meaning this test can be used) for the duration of the COVID-19 declaration under Section 564(b)(1) of the Act, 21 U.S.C.section 360bbb-3(b)(1), unless the authorization is terminated  or revoked sooner.       Influenza A by PCR NEGATIVE NEGATIVE Final   Influenza B by PCR NEGATIVE NEGATIVE Final    Comment: (NOTE) The Xpert Xpress SARS-CoV-2/FLU/RSV plus assay is intended as an aid in the diagnosis of influenza from Nasopharyngeal swab specimens and should not be used as a sole basis for treatment. Nasal washings and aspirates are unacceptable for Xpert Xpress SARS-CoV-2/FLU/RSV testing.  Fact Sheet for Patients: EntrepreneurPulse.com.au  Fact Sheet for Healthcare Providers: IncredibleEmployment.be  This test is not yet approved or cleared by the [place] FDA and has been authorized for detection and/or diagnosis of SARS-CoV-2 by FDA under an Emergency Use Authorization (EUA). This EUA will remain in effect (meaning this test can be used) for the duration of the COVID-19 declaration under Section 564(b)(1) of the Act, 21 U.S.C. section 360bbb-3(b)(1), unless the authorization is terminated or revoked.  Performed at [facility], [place] [address] [place], [place] [address]   MRSA PCR Screening     Status: None   Collection Time: 05/02/20  3:32 AM   Specimen: Nasal Mucosa; Nasopharyngeal  Result Value Ref Range Status    MRSA by PCR NEGATIVE NEGATIVE Final    Comment:        The GeneXpert MRSA Assay (FDA approved for NASAL specimens only), is one component of a comprehensive MRSA colonization surveillance program. It is not intended to diagnose MRSA infection nor to guide or monitor treatment for MRSA infections. Performed at [facility], [place] [address], [place],  [address]          Radiology Studies: No results found.      Scheduled Meds: . atorvastatin  20 mg Oral QHS  . Chlorhexidine Gluconate Cloth  6 each Topical Daily  . folic acid  1 mg Oral Daily  . lamoTRIgine  25 mg Oral Daily  . levETIRAcetam  1,000 mg Oral BID  . levothyroxine  50 mcg Oral  QAC breakfast  . metoprolol succinate  50 mg Oral Daily  . multivitamin with minerals  1 tablet Oral Daily  . pantoprazole  40 mg Oral Daily  . QUEtiapine  200 mg Oral QHS  . thiamine  100 mg Oral Daily   Continuous Infusions:   LOS: 4 days    Time spent: 36 minutes spent on chart review, discussion with nursing staff, consultants, updating family and interview/physical exam; more than 50% of that time was spent in counseling and/or coordination of care.    Adonna Horsley J [place] DO [facility] Available via Epic secure chat 7am-7pm After these hours, please refer to coverage provider listed on [email or web address] 05/04/2020, 3:13 PM

## 2020-05-04 NOTE — Discharge Summary (Signed)
Physician Discharge Summary  [name] [id number] DOB: 1950/08/10 DOA: 04/30/2020  PCP: Colon Branch, MD  Admit date: 04/30/2020 Discharge date: 05/04/2020  Admitted From: Home Disposition: Home  Recommendations for Outpatient Follow-up:  1. Follow up with PCP in 1-2 weeks 2. Follow-up with neurosurgery, Dr. Ellene Route in 2 weeks 3. Discontinued home aspirin secondary to subarachnoid hemorrhage 4. Continue Tylenol with codeine every 6 hours as needed headache  Home Health: Outpatient PT Equipment/Devices: Walker  Discharge Condition: Stable CODE STATUS: Full code Diet recommendation: Heart healthy diet  History of present illness:  Jeffrey Acosta is a 70 year old male with past medical history significant for EtOH abuse, bipolar affective disorder, diverticulosis, GERD, gout, migraine headache, hyperlipidemia, hypothyroidism, melanoma of skin, osteopenia and polyarthralgia who was admitted on 04/30/2020 with subarachnoid hemorrhage after collapsing at home with loss of consciousness.  CT head notable for subarachnoid hemorrhage.  CT angiogram head negative for aneurysm or AV malformation.  Neurosurgery was consulted and admitted under the PCCM service.  Patient was transferred from the PCCM service to hospital service on 05/04/2020.   Hospital course:  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) Patient presenting from home following loss of consciousness.  CT head notable for moderate/large amount of acute subarachnoid/extra-axial hemorrhage mostly concentrated at the suprasellar cistern.  CTA head/neck negative for aneurysm or other vascular abnormality.  Repeat CT head on 05/02/2020 with unchanged subarachnoid hemorrhage with minimal intraventricular reflux.  Patient did develop cranial nerve palsy that resolved and none since initial episode.  Neurosurgery was consulted and followed during hospital course.  No further recommendations from neurosurgery, Dr. Ellene Route and  can follow-up outpatient in 2 weeks.  Continue Tylenol with codeine every 6 hours as needed for headache.  EtOH cessation.  EtOH use disorder Patient was monitored on CIWA protocol with symptom triggered Ativan during hospitalization with no signs of withdrawal.  Discussed EtOH cessation.  Essential hypertension: Continue metoprolol succinate milligrams p.o. daily  Chronic thrombocytopenia Platelet count 119 at time of discharge.  Etiology likely secondary to chronic EtOH use disorder.  Bipolar disorder Continue home Lamictal and Seroquel  Hypothyroidism Continue home levothyroxine  Hyperlipidemia: Continue atorvastatin  GERD/Barrett's esophagus: Continue PPI  Hypomagnesemia: Repleted during hospitalization    Significant Diagnostic Tests:  CT head 1/4 > Moderate to large amount of acute subarachnoid /extra-axial hemorrhage, most heavily concentrated at the suprasellar cistern, and anterior to the brainstem. Findings felt consistent with ruptured aneurysm. Additional foci of extra-axial blood within the posterior fossa, left greater than right middle cranial fossa, and anterior inter hemispheric region. Small amount of bilateral intraventricular and fourth ventricular hemorrhage. Suspected left anterior subdural hygroma as well.  CT C-spine 1/4 > multilevel cervical spondylosis. No acute fracture.  CTA head/neck 1/4 > negative for aneurysm or other vascular abnormality.  No LVO.  Age indeterminate compression deformities at the superior endplates of C7, T3, T4.  CT head 1/6: Unchanged subarachnoid hemorrhage. Minimal intraventricular reflux. The ventricles measure a few millimeters larger than yesterday, but no ventriculomegaly    Discharge Diagnoses:  Active Problems:   Alcohol abuse   Subarachnoid hemorrhage [facility])    Discharge Instructions  Discharge Instructions    Ambulatory referral to Occupational Therapy   Complete by: As directed    Ambulatory referral to  Physical Therapy   Complete by: As directed    Call MD for:  difficulty breathing, headache or visual disturbances   Complete by: As directed    Call MD for:  extreme fatigue   Complete  by: As directed    Call MD for:  persistant dizziness or light-headedness   Complete by: As directed    Call MD for:  persistant nausea and vomiting   Complete by: As directed    Call MD for:  temperature >100.4   Complete by: As directed    Diet - low sodium heart healthy   Complete by: As directed    Increase activity slowly   Complete by: As directed      Allergies as of 05/04/2020      Reactions   Omnipaque [iohexol] Hives    Desc: developed hives after injection; resolved with 50 mg benadryl given to pt.      Medication List    STOP taking these medications   aspirin 81 MG tablet     TAKE these medications   acetaminophen-codeine 300-30 MG tablet Commonly known as: TYLENOL #3 Take 1 tablet by mouth every 6 (six) hours as needed for moderate pain.   allopurinol 300 MG tablet Commonly known as: ZYLOPRIM Take 1 tablet (300 mg total) by mouth daily.   atorvastatin 20 MG tablet Commonly known as: LIPITOR TAKE 1 TABLET(20 MG) BY MOUTH AT BEDTIME What changed: See the new instructions.   b complex vitamins capsule Take 1 capsule by mouth daily.   esomeprazole 40 MG capsule Commonly known as: NEXIUM Take 1 capsule (40 mg total) by mouth daily before breakfast.   indomethacin 25 MG capsule Commonly known as: INDOCIN Take 25 mg by mouth every 12 (twelve) hours as needed (back pain).   lamoTRIgine 25 MG tablet Commonly known as: LAMICTAL Take 25 mg by mouth daily.   levETIRAcetam 1000 MG tablet Commonly known as: KEPPRA Take 1 tablet (1,000 mg total) by mouth 2 (two) times daily for 6 days.   levothyroxine 50 MCG tablet Commonly known as: SYNTHROID Take 1 tablet (50 mcg total) by mouth daily before breakfast.   metoprolol succinate 100 MG 24 hr tablet Commonly known as:  TOPROL-XL Take 100 mg by mouth daily. Take with or immediately following a meal.   multivitamin with minerals Tabs tablet Take 1 tablet by mouth daily.   naltrexone 50 MG tablet Commonly known as: DEPADE Take 50 mg by mouth daily.   QUEtiapine 400 MG tablet Commonly known as: SEROQUEL Take 800 mg by mouth at bedtime.   tamsulosin 0.4 MG Caps capsule Commonly known as: FLOMAX Take 1 capsule (0.4 mg total) by mouth daily after supper.   timolol 0.5 % ophthalmic solution Commonly known as: TIMOPTIC 1 drop 2 (two) times daily.   vitamin B-12 500 MCG tablet Commonly known as: CYANOCOBALAMIN Take 500 mcg by mouth daily.   Vitamin D 50 MCG (2000 UT) tablet Take 2,000 Units by mouth daily.            Durable Medical Equipment  (From admission, onward)         Start     Ordered   05/03/20 1625  For home use only DME 3 n 1  Once        05/03/20 1624   05/03/20 1625  For home use only DME Walker rolling  Once       Question Answer Comment  Walker: With 5 Inch Wheels   Patient needs a walker to treat with the following condition Weakness      05/03/20 1624          Follow-up Information    [place] [facility] up.   Specialty:  Rehabilitation Why: The outpatient therapy will contact you for the first appointment Contact information: [address] [place]       Kristeen Miss, MD. Schedule an appointment as soon as possible for a visit in 2 week(s).   Specialty: Neurosurgery Contact information: [address] [place] [address] [phone]        Colon Branch, MD. Schedule an appointment as soon as possible for a visit in 1 week(s).   Specialty: Internal Medicine Contact information: [address] [name] [address] Keller, Cassie Freer, MD .   Specialties: Internal Medicine, Cardiology,  Radiology Contact information: [place] [address] [phone]              Allergies  Allergen Reactions  . Omnipaque [Iohexol] Hives     Desc: developed hives after injection; resolved with 50 mg benadryl given to pt.     Consultations:  Neurosurgery, Dr. Ellene Route   Procedures/Studies: CT Angio Head W or Wo Contrast  Result Date: 05/01/2020 CLINICAL DATA:  Initial evaluation for acute subarachnoid hemorrhage, evaluate for aneurysm. EXAM: CT ANGIOGRAPHY HEAD AND NECK TECHNIQUE: Multidetector CT imaging of the head and neck was performed using the standard protocol during bolus administration of intravenous contrast. Multiplanar CT image reconstructions and MIPs were obtained to evaluate the vascular anatomy. Carotid stenosis measurements (when applicable) are obtained utilizing NASCET criteria, using the distal internal carotid diameter as the denominator. CONTRAST:  73mL OMNIPAQUE IOHEXOL 350 MG/ML SOLN COMPARISON:  Prior head CT from 04/30/2020. FINDINGS: CTA NECK FINDINGS Aortic arch: Aortic arch of normal caliber with normal 3 vessel morphology. Mild atheromatous change within the arch itself. No hemodynamically significant stenosis about the origin of the great vessels. Right carotid system: Right CCA patent from its origin to the bifurcation without stenosis. Scattered calcified plaque about the right bifurcation/proximal right ICA with no more than 40% stenosis by NASCET criteria. Right ICA widely patent distally to the skull base without stenosis, dissection or occlusion. Left carotid system: Left CCA patent from its origin to the bifurcation without stenosis. Scattered atheromatous plaque about the left bifurcation/proximal left ICA without hemodynamically significant stenosis. Left ICA widely patent distally to the skull base without stenosis, dissection or occlusion. Vertebral arteries: Both vertebral arteries arise from the subclavian arteries. No proximal  subclavian artery stenosis. Right vertebral artery slightly dominant. Vertebral arteries patent within the neck without stenosis, dissection or occlusion. Skeleton: Age indeterminate wedging seen at the superior endplates of C7, T3, and T4. No other visible osseous abnormality. Moderate multilevel cervical spondylosis without high-grade stenosis. Other neck: No other acute soft tissue abnormality within the neck. No mass or adenopathy. Upper chest: Visualized upper chest demonstrates no acute finding. Review of the MIP images confirms the above findings CTA HEAD FINDINGS Anterior circulation: Petrous segments widely patent bilaterally. Scattered atheromatous plaque within the carotid siphons without hemodynamically significant stenosis or other abnormality. ICA termini well perfused. A1 segments patent bilaterally. Left A1 hypoplastic. Normal anterior communicating artery complex. Anterior cerebral arteries patent to their distal aspects without stenosis. No M1 stenosis or occlusion. Normal MCA bifurcations. Distal MCA branches well perfused and fairly symmetric. Posterior circulation: Both vertebral arteries patent to the vertebrobasilar junction without stenosis. Both PICA origins patent and normal. Basilar patent to its distal aspect without stenosis. Superior cerebellar arteries patent bilaterally. Both PCAs primarily supplied via the basilar. PCAs well  perfused to their distal aspects without stenosis. Venous sinuses: Grossly patent allowing for timing the contrast bolus. Anatomic variants: Hypoplastic left A1 segment. No visible aneurysm or other vascular abnormality evident by CTA. Review of the MIP images confirms the above findings IMPRESSION: 1. Negative intracranial CTA. No visible aneurysm or other vascular abnormality to explain patient's intracranial hemorrhage. 2. No large vessel occlusion. 3. Mild for age atheromatous change about the carotid bifurcations and carotid siphons without hemodynamically  significant stenosis. 4. Age-indeterminate compression deformities at the superior endplates of C7, T3, and T4. Correlation with physical exam recommended. Findings could be further assessed with dedicated MRI as clinically warranted. Electronically Signed   By: Jeannine Boga M.D.   On: 05/01/2020 02:36   CT HEAD WO CONTRAST  Result Date: 05/02/2020 CLINICAL DATA:  Stroke follow-up EXAM: CT HEAD WITHOUT CONTRAST TECHNIQUE: Contiguous axial images were obtained from the base of the skull through the vertex without intravenous contrast. COMPARISON:  Yesterday FINDINGS: Brain: Unchanged subarachnoid hemorrhage centered in the basal cisterns and along the left more than right sylvian fissure and cerebral convexities. Prominent extra-axial CSF density around the cerebral convexities without discrete subdural collection. Small volume intraventricular reflux seen at the atrium of the right lateral ventricle. The lateral ventricles measure 2-3 mm greater in diameter than compared to yesterday, without overt ventriculomegaly. Negative for infarct. Vascular: Negative Skull: Negative Sinuses/Orbits: Negative IMPRESSION: 1. Unchanged subarachnoid hemorrhage. 2. Minimal intraventricular reflux. The ventricles measure a few millimeters larger than yesterday, but no ventriculomegaly. Electronically Signed   By: Monte Fantasia M.D.   On: 05/02/2020 04:25   CT Head Wo Contrast  Result Date: 05/01/2020 CLINICAL DATA:  Headache and nausea, emesis, intracranial hemorrhage yesterday EXAM: CT HEAD WITHOUT CONTRAST TECHNIQUE: Contiguous axial images were obtained from the base of the skull through the vertex without intravenous contrast. COMPARISON:  05/01/2020, 04/30/2020 FINDINGS: Brain: Subarachnoid hemorrhage is again noted within the posterior fossa, basilar cisterns, suprasellar region, anterior inter hemispheric region, and left sylvian fissure. Minimal intraventricular hemorrhage seen within the fourth ventricle and  occipital horns of the lateral ventricles. There is a small focus of subarachnoid hemorrhage in the left temporoparietal region image 19, new since prior study. Otherwise no significant changes. No evidence of acute infarct. The lateral ventricles and midline structures are stable. No evidence of hydrocephalus. No mass effect. Vascular: No hyperdense vessel or unexpected calcification. Skull: Normal. Negative for fracture or focal lesion. Sinuses/Orbits: No acute finding. Other: None. IMPRESSION: 1. Small focus of new subarachnoid hemorrhage within the left temporoparietal region. Otherwise the diffuse subarachnoid hemorrhage seen throughout the left sylvian fissure, suprasellar region, basilar cisterns, and posterior fossa is unchanged. 2. No evidence of acute infarct. 3. No mass effect. Electronically Signed   By: Randa Ngo M.D.   On: 05/01/2020 22:27   CT HEAD WO CONTRAST  Result Date: 04/30/2020 CLINICAL DATA:  Collapsed in bathroom EXAM: CT HEAD WITHOUT CONTRAST TECHNIQUE: Contiguous axial images were obtained from the base of the skull through the vertex without intravenous contrast. COMPARISON:  MRI 09/14/2014, CT brain 08/14/2014 FINDINGS: Brain: Moderate to large amount of acute subarachnoid/extra-axial hemorrhage, most heavily concentrated at the suprasellar cistern, and anterior to the brainstem. Extra-axial hemorrhage present within the posterior fossa, with moderate subarachnoid blood along the left temporal lobe. Small amount of inter hemispheric subarachnoid blood anteriorly with trace amount of acute hemorrhage along the left inferior frontal lobe. Small amount of subarachnoid hemorrhage within the right middle cranial fossa. Trace hemorrhage within the fourth  ventricle and layering within the posterior horns of the lateral ventricles. Mild to moderate atrophy. Asymmetric extra-axial CSF density overlying the left frontal lobe as noted on previous exams, likely due to a small chronic subdural  hygroma. Slight interval increase in lateral and third ventricular dilatation. Stable normal sized fourth ventricle. No midline shift. Vascular: Carotid vascular calcification. Skull: Normal. Negative for fracture or focal lesion. Sinuses/Orbits: No acute finding. Other: None IMPRESSION: 1. Moderate to large amount of acute subarachnoid /extra-axial hemorrhage, most heavily concentrated at the suprasellar cistern, and anterior to the brainstem. Findings felt consistent with ruptured aneurysm. Additional foci of extra-axial blood within the posterior fossa, left greater than right middle cranial fossa, and anterior inter hemispheric region. Small amount of bilateral intraventricular and fourth ventricular hemorrhage. Slight interval increase in size/dilatation of lateral and third ventricles compared to previous. 2. Atrophy.  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 Critical Value/emergent results were called by telephone at the time of interpretation on 04/30/2020 at 10:16 pm to provider [facility] , who verbally acknowledged these results. Electronically Signed   By: Donavan Foil M.D.   On: 04/30/2020 22:16   CT Angio Neck W and/or Wo Contrast  Result Date: 05/01/2020 CLINICAL DATA:  Initial evaluation for acute subarachnoid hemorrhage, evaluate for aneurysm. EXAM: CT ANGIOGRAPHY HEAD AND NECK TECHNIQUE: Multidetector CT imaging of the head and neck was performed using the standard protocol during bolus administration of intravenous contrast. Multiplanar CT image reconstructions and MIPs were obtained to evaluate the vascular anatomy. Carotid stenosis measurements (when applicable) are obtained utilizing NASCET criteria, using the distal internal carotid diameter as the denominator. CONTRAST:  93mL OMNIPAQUE IOHEXOL 350 MG/ML SOLN COMPARISON:  Prior head CT from 04/30/2020. FINDINGS: CTA NECK FINDINGS Aortic arch: Aortic arch of normal caliber with normal 3 vessel morphology. Mild atheromatous  change within the arch itself. No hemodynamically significant stenosis about the origin of the great vessels. Right carotid system: Right CCA patent from its origin to the bifurcation without stenosis. Scattered calcified plaque about the right bifurcation/proximal right ICA with no more than 40% stenosis by NASCET criteria. Right ICA widely patent distally to the skull base without stenosis, dissection or occlusion. Left carotid system: Left CCA patent from its origin to the bifurcation without stenosis. Scattered atheromatous plaque about the left bifurcation/proximal left ICA without hemodynamically significant stenosis. Left ICA widely patent distally to the skull base without stenosis, dissection or occlusion. Vertebral arteries: Both vertebral arteries arise from the subclavian arteries. No proximal subclavian artery stenosis. Right vertebral artery slightly dominant. Vertebral arteries patent within the neck without stenosis, dissection or occlusion. Skeleton: Age indeterminate wedging seen at the superior endplates of C7, T3, and T4. No other visible osseous abnormality. Moderate multilevel cervical spondylosis without high-grade stenosis. Other neck: No other acute soft tissue abnormality within the neck. No mass or adenopathy. Upper chest: Visualized upper chest demonstrates no acute finding. Review of the MIP images confirms the above findings CTA HEAD FINDINGS Anterior circulation: Petrous segments widely patent bilaterally. Scattered atheromatous plaque within the carotid siphons without hemodynamically significant stenosis or other abnormality. ICA termini well perfused. A1 segments patent bilaterally. Left A1 hypoplastic. Normal anterior communicating artery complex. Anterior cerebral arteries patent to their distal aspects without stenosis. No M1 stenosis or occlusion. Normal MCA bifurcations. Distal MCA branches well perfused and fairly symmetric. Posterior circulation: Both vertebral arteries  patent to the vertebrobasilar junction without stenosis. Both PICA origins patent and normal. Basilar patent to its distal aspect without stenosis.  Superior cerebellar arteries patent bilaterally. Both PCAs primarily supplied via the basilar. PCAs well perfused to their distal aspects without stenosis. Venous sinuses: Grossly patent allowing for timing the contrast bolus. Anatomic variants: Hypoplastic left A1 segment. No visible aneurysm or other vascular abnormality evident by CTA. Review of the MIP images confirms the above findings IMPRESSION: 1. Negative intracranial CTA. No visible aneurysm or other vascular abnormality to explain patient's intracranial hemorrhage. 2. No large vessel occlusion. 3. Mild for age atheromatous change about the carotid bifurcations and carotid siphons without hemodynamically significant stenosis. 4. Age-indeterminate compression deformities at the superior endplates of C7, T3, and T4. Correlation with physical exam recommended. Findings could be further assessed with dedicated MRI as clinically warranted. Electronically Signed   By: Jeannine Boga M.D.   On: 05/01/2020 02:36   CT Cervical Spine Wo Contrast  Result Date: 04/30/2020 CLINICAL DATA:  Found down, syncope, incontinent EXAM: CT CERVICAL SPINE WITHOUT CONTRAST TECHNIQUE: Multidetector CT imaging of the cervical spine was performed without intravenous contrast. Multiplanar CT image reconstructions were also generated. COMPARISON:  None. FINDINGS: Alignment: Alignment is grossly anatomic. Skull base and vertebrae: No acute fracture. No primary bone lesion or focal pathologic process. Subarachnoid hemorrhage is seen at the basilar cisterns and foramen magnum. Please refer to separately reported head CT. Soft tissues and spinal canal: No prevertebral fluid or swelling. No visible canal hematoma. Moderate atherosclerosis at the carotid bifurcations. Disc levels: Severe spondylosis at C3-4, C4-5, and C5-6. Minimal  symmetrical neural foraminal encroachment at those levels. Upper chest: Airway is patent.  Lung apices are clear. Other: Reconstructed images demonstrate no additional findings. IMPRESSION: 1. Multilevel cervical spondylosis.  No acute fracture. 2. Intracranial subarachnoid hemorrhage. Please refer to separately reported head CT exam. Electronically Signed   By: Randa Ngo M.D.   On: 04/30/2020 22:15      Subjective: Patient seen and examined at bedside, resting comfortably.  Continues with headache.  Seen by neurosurgery yesterday evening and okay for discharge home.  No other questions or concerns at this time.  Denies visual changes, no chest pain, no palpitations, no shortness of breath, no abdominal pain, no weakness, no fatigue, no paresthesias.  No acute events overnight per nursing staff.  Discharge Exam: Vitals:   05/04/20 0042 05/04/20 0431  BP: (!) 141/90 (!) 168/86  Pulse: 75 74  Resp: 17 17  Temp: 97.7 F (36.5 C) (!) 97.5 F (36.4 C)  SpO2: 94% 100%   Vitals:   05/03/20 1433 05/03/20 1938 05/04/20 0042 05/04/20 0431  BP: (!) 150/86 (!) 156/90 (!) 141/90 (!) 168/86  Pulse: 71 69 75 74  Resp: 18 18 17 17   Temp: (!) 97.5 F (36.4 C) 98.3 F (36.8 C) 97.7 F (36.5 C) (!) 97.5 F (36.4 C)  TempSrc: Oral Oral Oral Oral  SpO2: 98% 100% 94% 100%  Weight:      Height:        General: Pt is alert, awake, not in acute distress Cardiovascular: RRR, S1/S2 +, no rubs, no gallops Respiratory: CTA bilaterally, no wheezing, no rhonchi Abdominal: Soft, NT, ND, bowel sounds + Extremities: no edema, no cyanosis    The results of significant diagnostics from this hospitalization (including imaging, microbiology, ancillary and laboratory) are listed below for reference.     Microbiology: Recent Results (from the past 240 hour(s))  Resp Panel by RT-PCR (Flu A&B, Covid) Nasopharyngeal Swab     Status: None   Collection Time: 05/01/20 12:08 AM   Specimen: Nasopharyngeal  Swab; Nasopharyngeal(NP) swabs in vial transport medium  Result Value Ref Range Status   SARS Coronavirus 2 by RT PCR NEGATIVE NEGATIVE Final    Comment: (NOTE) SARS-CoV-2 target nucleic acids are NOT DETECTED.  The SARS-CoV-2 RNA is generally detectable in upper respiratory specimens during the acute phase of infection. The lowest concentration of SARS-CoV-2 viral copies this assay can detect is 138 copies/mL. A negative result does not preclude SARS-Cov-2 infection and should not be used as the sole basis for treatment or other patient management decisions. A negative result may occur with  improper specimen collection/handling, submission of specimen other than nasopharyngeal swab, presence of viral mutation(s) within the areas targeted by this assay, and inadequate number of viral copies(<138 copies/mL). A negative result must be combined with clinical observations, patient history, and epidemiological information. The expected result is Negative.  Fact Sheet for Patients:  EntrepreneurPulse.com.au  Fact Sheet for Healthcare Providers:  IncredibleEmployment.be  This test is no t yet approved or cleared by the [place] FDA and  has been authorized for detection and/or diagnosis of SARS-CoV-2 by FDA under an Emergency Use Authorization (EUA). This EUA will remain  in effect (meaning this test can be used) for the duration of the COVID-19 declaration under Section 564(b)(1) of the Act, 21 U.S.C.section 360bbb-3(b)(1), unless the authorization is terminated  or revoked sooner.       Influenza A by PCR NEGATIVE NEGATIVE Final   Influenza B by PCR NEGATIVE NEGATIVE Final    Comment: (NOTE) The Xpert Xpress SARS-CoV-2/FLU/RSV plus assay is intended as an aid in the diagnosis of influenza from Nasopharyngeal swab specimens and should not be used as a sole basis for treatment. Nasal washings and aspirates are unacceptable for Xpert Xpress  SARS-CoV-2/FLU/RSV testing.  Fact Sheet for Patients: EntrepreneurPulse.com.au  Fact Sheet for Healthcare Providers: IncredibleEmployment.be  This test is not yet approved or cleared by the [place] FDA and has been authorized for detection and/or diagnosis of SARS-CoV-2 by FDA under an Emergency Use Authorization (EUA). This EUA will remain in effect (meaning this test can be used) for the duration of the COVID-19 declaration under Section 564(b)(1) of the Act, 21 U.S.C. section 360bbb-3(b)(1), unless the authorization is terminated or revoked.  Performed at [facility], [place] [address], [place], [place] [address]   MRSA PCR Screening     Status: None   Collection Time: 05/02/20  3:32 AM   Specimen: Nasal Mucosa; Nasopharyngeal  Result Value Ref Range Status   MRSA by PCR NEGATIVE NEGATIVE Final    Comment:        The GeneXpert MRSA Assay (FDA approved for NASAL specimens only), is one component of a comprehensive MRSA colonization surveillance program. It is not intended to diagnose MRSA infection nor to guide or monitor treatment for MRSA infections. Performed at [facility], [place] [address], [place], [place] [address]      Labs: BNP (last 3 results) No results for input(s): BNP in the last 8760 hours. Basic Metabolic Panel: Recent Labs  Lab 04/30/20 2038 05/01/20 0007 05/01/20 0026 05/01/20 0500 05/02/20 0452 05/04/20 0339  NA 141  --  146* 141 140 139  K 3.8  --  4.1 4.3 4.4 3.9  CL 106  --  105 106 102 103  CO2 23  --   --  21* 23 24  GLUCOSE 93  --  106* 113* 113* 100*  BUN 10  --  11 10 20 12   CREATININE  1.16  --  1.50* 1.10 1.32* 1.06  CALCIUM 9.0  --   --  9.0 9.2 8.9  MG  --  1.5*  --  1.3* 1.9 1.6*  PHOS  --  4.0  --  3.7 4.1 3.1   Liver Function Tests: Recent Labs  Lab 04/30/20 2038  AST 29  ALT 15  ALKPHOS 51  BILITOT 0.9  PROT 5.8*  ALBUMIN 3.6   No results for input(s): LIPASE,  AMYLASE in the last 168 hours. No results for input(s): AMMONIA in the last 168 hours. CBC: Recent Labs  Lab 04/30/20 2038 05/01/20 0007 05/01/20 0026 05/01/20 0500 05/02/20 0452 05/04/20 0339  WBC 4.6 5.1  --  3.1* 9.3 3.9*  HGB 12.9* 13.3 12.9* 13.3 12.6* 12.2*  HCT 36.0* 36.5* 38.0* 35.7* 35.5* 32.6*  MCV 100.3* 100.0  --  99.7 100.6* 99.1  PLT 107* 111*  --  106* 127* 119*   Cardiac Enzymes: No results for input(s): CKTOTAL, CKMB, CKMBINDEX, TROPONINI in the last 168 hours. BNP: Invalid input(s): POCBNP CBG: Recent Labs  Lab 05/01/20 2040  GLUCAP 81   D-Dimer No results for input(s): DDIMER in the last 72 hours. Hgb A1c No results for input(s): HGBA1C in the last 72 hours. Lipid Profile No results for input(s): CHOL, HDL, LDLCALC, TRIG, CHOLHDL, LDLDIRECT in the last 72 hours. Thyroid function studies No results for input(s): TSH, T4TOTAL, T3FREE, THYROIDAB in the last 72 hours.  Invalid input(s): FREET3 Anemia work up No results for input(s): VITAMINB12, FOLATE, FERRITIN, TIBC, IRON, RETICCTPCT in the last 72 hours. Urinalysis    Component Value Date/Time   COLORURINE YELLOW 05/01/2020 0259   APPEARANCEUR CLEAR 05/01/2020 0259   APPEARANCEUR Clear 09/20/2014 1130   LABSPEC 1.025 05/01/2020 0259   PHURINE 5.0 05/01/2020 0259   GLUCOSEU NEGATIVE 05/01/2020 0259   GLUCOSEU NEGATIVE 03/20/2015 1130   HGBUR SMALL (A) 05/01/2020 0259   BILIRUBINUR NEGATIVE 05/01/2020 0259   BILIRUBINUR Negative 09/20/2014 1130   KETONESUR NEGATIVE 05/01/2020 0259   PROTEINUR NEGATIVE 05/01/2020 0259   UROBILINOGEN 0.2 03/20/2015 1130   NITRITE NEGATIVE 05/01/2020 0259   LEUKOCYTESUR NEGATIVE 05/01/2020 0259   Sepsis Labs Invalid input(s): PROCALCITONIN,  WBC,  LACTICIDVEN Microbiology Recent Results (from the past 240 hour(s))  Resp Panel by RT-PCR (Flu A&B, Covid) Nasopharyngeal Swab     Status: None   Collection Time: 05/01/20 12:08 AM   Specimen: Nasopharyngeal Swab;  Nasopharyngeal(NP) swabs in vial transport medium  Result Value Ref Range Status   SARS Coronavirus 2 by RT PCR NEGATIVE NEGATIVE Final    Comment: (NOTE) SARS-CoV-2 target nucleic acids are NOT DETECTED.  The SARS-CoV-2 RNA is generally detectable in upper respiratory specimens during the acute phase of infection. The lowest concentration of SARS-CoV-2 viral copies this assay can detect is 138 copies/mL. A negative result does not preclude SARS-Cov-2 infection and should not be used as the sole basis for treatment or other patient management decisions. A negative result may occur with  improper specimen collection/handling, submission of specimen other than nasopharyngeal swab, presence of viral mutation(s) within the areas targeted by this assay, and inadequate number of viral copies(<138 copies/mL). A negative result must be combined with clinical observations, patient history, and epidemiological information. The expected result is Negative.  Fact Sheet for Patients:  EntrepreneurPulse.com.au  Fact Sheet for Healthcare Providers:  IncredibleEmployment.be  This test is no t yet approved or cleared by the [place] FDA and  has been authorized for detection and/or diagnosis  of SARS-CoV-2 by FDA under an Emergency Use Authorization (EUA). This EUA will remain  in effect (meaning this test can be used) for the duration of the COVID-19 declaration under Section 564(b)(1) of the Act, 21 U.S.C.section 360bbb-3(b)(1), unless the authorization is terminated  or revoked sooner.       Influenza A by PCR NEGATIVE NEGATIVE Final   Influenza B by PCR NEGATIVE NEGATIVE Final    Comment: (NOTE) The Xpert Xpress SARS-CoV-2/FLU/RSV plus assay is intended as an aid in the diagnosis of influenza from Nasopharyngeal swab specimens and should not be used as a sole basis for treatment. Nasal washings and aspirates are unacceptable for Xpert Xpress  SARS-CoV-2/FLU/RSV testing.  Fact Sheet for Patients: EntrepreneurPulse.com.au  Fact Sheet for Healthcare Providers: IncredibleEmployment.be  This test is not yet approved or cleared by the [place] FDA and has been authorized for detection and/or diagnosis of SARS-CoV-2 by FDA under an Emergency Use Authorization (EUA). This EUA will remain in effect (meaning this test can be used) for the duration of the COVID-19 declaration under Section 564(b)(1) of the Act, 21 U.S.C. section 360bbb-3(b)(1), unless the authorization is terminated or revoked.  Performed at [facility], [place] [address], [place], [place] [address]   MRSA PCR Screening     Status: None   Collection Time: 05/02/20  3:32 AM   Specimen: Nasal Mucosa; Nasopharyngeal  Result Value Ref Range Status   MRSA by PCR NEGATIVE NEGATIVE Final    Comment:        The GeneXpert MRSA Assay (FDA approved for NASAL specimens only), is one component of a comprehensive MRSA colonization surveillance program. It is not intended to diagnose MRSA infection nor to guide or monitor treatment for MRSA infections. Performed at [facility], [place] [address], [place], [place] [address]      Time coordinating discharge: Over 30 minutes  SIGNED:   Michaelyn Wall J [place] DO  [facility] 05/04/2020, 7:56 AM

## 2020-05-04 NOTE — Progress Notes (Signed)
Occupational Therapy Treatment Patient Details Name: Jeffrey Acosta MRN: [id number] DOB: 1950-05-03 Today's Date: 05/04/2020    History of present illness The pt is a 70 yo male presenting from home after a fall at home. 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 PMH includes: alcohol use, bipolar affective disorder,  hypothyroidism, atrial tachycardia.   OT comments  Pt seen for follow up session to address higher level balance deficits. Pt continues to report increased pain/headache but agreeable. OT completed extensive education on sensitivity to light and frequent breaks throughout the day to hopefully manage HA/pain. Pt demos continued need for Sup with dynamic balance activities in standing during grooming, difficulty with forward flexion for washing face and observed compensating by remaining upright during activities. Pt motivated and agreeable for post acute OT services and is aware of balance deficits impairing indep with IADL's like meal prep/cleaning/laundry stating ''i'm not moving quite as well as I was before this happened." recommendations for outpatient services remain appropriate. OT will follow while in house.    Follow Up Recommendations  Outpatient OT    Equipment Recommendations  None recommended by OT    Recommendations for Other Services      Precautions / Restrictions Precautions Precautions: Fall       Mobility Bed Mobility Overal bed mobility: Independent                Transfers Overall transfer level: Modified independent Equipment used: None                  Balance                             High level balance activites: Direction changes;Turns;Sudden stops High Level Balance Comments: CGA for safety this date, no significnat LOB but steadying with turn and direction changes           ADL either performed or assessed with clinical judgement   ADL Overall ADL's :  Needs assistance/impaired     Grooming: Wash/dry hands;Wash/dry face;Supervision/safety Pt tolerated completion of standing grooming tasks at sink with Sup observed compensating for slight LOB with forward flexion and reach outside of base of support >6 inches.                    Toilet Transfer: Supervision/safety             General ADL Comments: cues for speed of compeltion of tasks throughout session. no significnat presence of LOB but benefited from cueing for modification     Vision       Perception     Praxis      Cognition Arousal/Alertness: Awake/alert Behavior During Therapy: WFL for tasks assessed/performed                                            Exercises     Shoulder Instructions       General Comments      Pertinent Vitals/ Pain       Pain Assessment: 0-10 Pain Score: 9  Pain Location: headache Pain Intervention(s): Monitored during session  Home Living  Prior Functioning/Environment              Frequency  Min 2X/week        Progress Toward Goals  OT Goals(current goals can now be found in the care plan section)     Acute Rehab OT Goals Patient Stated Goal: to go home today OT Goal Formulation: With patient  Plan Discharge plan remains appropriate    Co-evaluation                 AM-PAC OT "6 Clicks" Daily Activity     Outcome Measure   Help from another person eating meals?: None Help from another person taking care of personal grooming?: A Little Help from another person toileting, which includes using toliet, bedpan, or urinal?: A Little Help from another person bathing (including washing, rinsing, drying)?: A Little Help from another person to put on and taking off regular upper body clothing?: None Help from another person to put on and taking off regular lower body clothing?: None 6 Click Score: 21    End of Session  Equipment Utilized During Treatment: Gait belt  OT Visit Diagnosis: Unsteadiness on feet (R26.81)   Activity Tolerance Patient tolerated treatment well   Patient Left in bed;with call bell/phone within reach;with bed alarm set   Nurse Communication Mobility status;Precautions        Time: 5361-4431 OT Time Calculation (min): 11 min  Charges: OT General Charges $OT Visit: 1 Visit OT Treatments $Self Care/Home Management : 8-22 mins  Jayquon Theiler OTR/L acute rehab services Office: [phone]   Joya Gaskins 05/04/2020, 10:37 AM

## 2020-05-04 NOTE — TOC Progression Note (Signed)
Transition of Care [facility]) - Progression Note    Patient Details  Name: Jeffrey Acosta MRN: [id number] Date of Birth: [birth year]  [facility] CM/SW [place], RN Phone Number: cellulitis  05/04/2020, 9:26 AM  Clinical Narrative:    3 in 1 and rolling walker have been ordered through [facility] and will be delivered to the room.   Expected Discharge Plan: OP Rehab Barriers to Discharge: Continued Medical Work up  Expected Discharge Plan and Services Expected Discharge Plan: OP Rehab   Discharge Planning Services: CM Consult   Living arrangements for the past 2 months: Single Family Home Expected Discharge Date: 05/04/20                                     Social Determinants of Health (SDOH) Interventions    Readmission Risk Interventions No flowsheet data found.

## 2020-05-04 NOTE — Discharge Instructions (Signed)
Subarachnoid Hemorrhage Subarachnoid hemorrhage is bleeding in the area between the brain and the membrane that covers the brain (subarachnoid space). This bleeding increases the pressure on the brain and decreases blood flow to certain areas of the brain. Subarachnoid hemorrhage is a medical emergency. If this condition is not treated, it may cause permanent brain damage, stroke, or even death. What are the causes? This condition may be caused by:  A burst blood vessel in the brain (ruptured brain aneurysm).  A head injury.  Bleeding from blood vessels that have developed abnormally (arteriovenous malformation).  A bleeding disorder.  Use of blood-thinning medicines (anticoagulants).  Use of certain drugs, such as cocaine. In some cases, the cause is not known. What increases the risk? You are more likely to develop this condition if:  You smoke.  You have high blood pressure (hypertension).  You abuse alcohol.  You are older than age 42.  You are male, especially if you have gone through menopause.  You have a family history of aneurysms.  You have a certain genetic syndrome that results in kidney disease (autosomal dominant polycystic kidney disease) or connective tissue disease. What are the signs or symptoms? Symptoms of this condition include:  A sudden, severe headache. The headache is often described as the worst headache ever experienced.  Nausea or vomiting, especially when combined with other symptoms such as a headache.  Sudden weakness or numbness of the face, arm, or leg, especially on one side of the body.  Sudden trouble walking or difficulty moving an arm or leg.  Sudden confusion.  Trouble speaking (expressive aphasia) or understanding speech (receptive aphasia).  Difficulty swallowing.  Sudden trouble seeing out of one or both eyes.  Double vision.  Dizziness.  Loss of balance or coordination.  Sensitivity to light.  Stiff  neck.  Drowsiness.  Loss of consciousness. How is this diagnosed? This condition is diagnosed based on a physical exam and your symptoms. You may have tests, such as:  CT scan.  MRI.  A procedure to examine the blood vessels in the brain and neck (cerebral angiogram). For this test, a dye is injected into your bloodstream before X-rays are taken to examine blood flow. Dye makes the blood vessels easier to see.  A procedure to remove and examine a small amount of the fluid that surrounds the brain and spinal cord (lumbar puncture).  Blood tests.  An ultrasound to monitor blood flow in the brain (transcranial Doppler). How is this treated? This condition often requires immediate treatment in the hospital to lower the risk of brain damage. Treatment depends on the cause, severity, and location of the bleeding and any damage that it has caused. Treatment goals are to stop bleeding, repair the cause of bleeding, relieve symptoms, and prevent problems. Treatment may include:  Medicines that: ? Reverse the effects of blood thinners, if you were taking blood thinners before the subarachnoid hemorrhage. ? Lower the blood pressure (antihypertensives). ? Relieve pain (analgesics). ? Relieve nausea or vomiting. ? Prevent seizures.  Surgery to stop bleeding, repair the cause of the bleeding, or remove blood that has collected. This may involve: ? A procedure that is done from inside the blood vessel, in which the aneurysm is filled with small, platinum coils (endovascular coiling). ? Opening the skull (craniotomy) to reach the aneurysm and put a clip at the base of the aneurysm (surgical clipping).  Surgery to relieve pressure on the brain by placing a tube (external ventricular drain, EVD) in the  brain to drain blood.  Physical, occupational, or speech-language therapy to improve any mental (cognitive) and day-to-day functions that are affected by your condition. Other treatment depends on  the cause and severity of symptoms, and how long the symptoms have lasted. Actions may be taken to prevent short-term and long-term problems, including lung infection (pneumonia) and blood clots in your legs. Follow these instructions at home: Medicines  Take over-the-counter and prescription medicines only as told by your health care provider.  Do not take any medicines that contain aspirin or NSAIDs unless your health care provider approves. Lifestyle  Do not use any products that contain nicotine or tobacco, such as cigarettes and e-cigarettes. If you need help quitting, ask your health care provider.  Limit alcohol intake to no more than 1 drink a day for nonpregnant women and 2 drinks a day for men. One drink equals 12 oz of beer, 5 oz of wine, or 1 oz of hard liquor. Eating and drinking  Follow instructions from your health care provider about whether eating and drinking are safe for you. You may need tests to make sure that you can swallow safely (swallow studies). Driving  Do not drive until your health care provider approves.  Do not drive or use heavy machinery while taking prescription pain medicine. General instructions  Do physical, occupational, and speech-language therapy as recommended.  Rest and limit activities as directed. Rest helps the brain to heal. Make sure you: ? Get plenty of sleep. ? Avoid activities that cause physical or mental stress.  Check and write down your blood pressure as told by your health care provider.  Keep all follow-up visits as told by your health care providers. This is important. Contact a health care provider if:  You have a stiff neck.  You have a cough.  You have a fever. Get help right away if:  You have any symptoms of a stroke. "BE FAST" is an easy way to remember the main warning signs of a stroke: ? B - Balance. Signs are dizziness, sudden trouble walking, or loss of balance. ? E - Eyes. Signs are trouble seeing or a  sudden change in vision. ? F - Face. Signs are sudden weakness or numbness of the face, or the face or eyelid drooping on one side. ? A - Arms. Signs are weakness or numbness in an arm. This happens suddenly and usually on one side of the body. ? S - Speech. Signs are sudden trouble speaking, slurred speech, or trouble understanding what people say. ? T - Time. Time to call emergency services. Write down what time symptoms started.  You have other signs of a stroke, such as: ? A sudden, severe headache with no known cause. ? Nausea or vomiting. ? Seizure. These symptoms may represent a serious problem that is an emergency. Do not wait to see if the symptoms will go away. Get medical help right away. Call your local emergency services (911 in the [place] Do not drive yourself to the hospital. Summary  Subarachnoid hemorrhage is bleeding in the area between the brain and the membrane that covers the brain (subarachnoid space).  Subarachnoid hemorrhage is a medical emergency. Treatment may include procedures to stop bleeding or reduce pressure in the skull, and medicines to prevent complications.  Follow instructions from your health care provider about diet restrictions, activity restrictions, and medicines. This information is not intended to replace advice given to you by your health care provider. Make sure you discuss any  questions you have with your health care provider. Document Revised: 10/03/2018 Document Reviewed: 01/21/2017 Elsevier Patient Education  2020 [employer].

## 2020-05-05 DIAGNOSIS — F1010 Alcohol abuse, uncomplicated: Secondary | ICD-10-CM | POA: Diagnosis not present

## 2020-05-05 DIAGNOSIS — F319 Bipolar disorder, unspecified: Secondary | ICD-10-CM | POA: Diagnosis not present

## 2020-05-05 DIAGNOSIS — I10 Essential (primary) hypertension: Secondary | ICD-10-CM | POA: Diagnosis not present

## 2020-05-05 MED ORDER — LOSARTAN POTASSIUM 25 MG PO TABS
25.0000 mg | ORAL_TABLET | ORAL | Status: AC
  Administered 2020-05-05: 25 mg via ORAL
  Filled 2020-05-05: qty 1

## 2020-05-05 MED ORDER — OXYCODONE-ACETAMINOPHEN 5-325 MG PO TABS
2.0000 | ORAL_TABLET | Freq: Four times a day (QID) | ORAL | Status: DC | PRN
  Administered 2020-05-05 – 2020-05-06 (×2): 2 via ORAL
  Filled 2020-05-05 (×3): qty 2

## 2020-05-05 MED ORDER — LOSARTAN POTASSIUM 25 MG PO TABS
25.0000 mg | ORAL_TABLET | Freq: Every day | ORAL | Status: DC
  Administered 2020-05-05: 25 mg via ORAL
  Filled 2020-05-05: qty 1

## 2020-05-05 MED ORDER — LOSARTAN POTASSIUM 50 MG PO TABS
50.0000 mg | ORAL_TABLET | Freq: Every day | ORAL | Status: DC
  Administered 2020-05-06: 50 mg via ORAL
  Filled 2020-05-05: qty 1

## 2020-05-05 NOTE — Progress Notes (Signed)
PROGRESS NOTE    HAROLDO ARNWINE  [id number] DOB: 12-07-1950 DOA: 04/30/2020 PCP: Colon Branch, MD    Brief Narrative:  AYDRIAN KIRKBRIDE is a 70 year old male with past medical history significant for EtOH abuse, bipolar affective disorder, diverticulosis, GERD, gout, migraine headache, hyperlipidemia, hypothyroidism, melanoma of skin, osteopenia and polyarthralgia who was admitted on 04/30/2020 with subarachnoid hemorrhage after collapsing at home with loss of consciousness.  CT head notable for subarachnoid hemorrhage.  CT angiogram head negative for aneurysm or AV malformation.  Neurosurgery was consulted and admitted under the PCCM service.  Patient was transferred from the PCCM service to hospital service on 05/04/2020.   Assessment & Plan:   Active Problems:   Alcohol abuse   Subarachnoid hemorrhage (HCC)   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) Patient presenting from home following loss of consciousness.  CT head notable for moderate/large amount of acute subarachnoid/extra-axial hemorrhage mostly concentrated at the suprasellar cistern.  CTA head/neck negative for aneurysm or other vascular abnormality.  Repeat CT head on 05/02/2020 with unchanged subarachnoid hemorrhage with minimal intraventricular reflux.  Patient did develop cranial nerve palsy that resolved and none since initial episode.  Neurosurgery was consulted and followed during hospital course.  MR brain 1/7 with scattered subarachnoid hemorrhage, decreased compared to CT dated 05/02/2020 with stable ventricular size without worsening hydrocephalus or ventriculomegaly, does note events cerebral atrophy for age. No further recommendations from neurosurgery, Dr. Ellene Route and can follow-up outpatient in 2 weeks.  --Change Increased oxycodone-acetaminophen 5/325mg  to 2 tabs q6h prn moderate pain --Morphine 1-2mg  IV as needed severe breakthrough pain --Restarted home metoprolol succinate at reduced dose 50 mg  p.o. daily --start losartan 25mg  PO daily for better BP control; likely unable to uptitrate metoprolol with borderline bradycardia at 50mg  dose.  --Hydralazine 25 mg p.o. every 6 hours as needed SBP >150 --Discontinued home aspirin  EtOH use disorder Discussed with patient need for complete EtOH cessation. --CIWA protocol with symptom triggered Ativan --Continue multivitamin, folic acid, thiamine  Essential hypertension:  --Continue metoprolol succinate 50mg  p.o. daily --started losartan 25mg  po daily --hydralazine prn as above  Chronic thrombocytopenia Platelet count 119 at time of discharge.  Etiology likely secondary to chronic EtOH use disorder.  Bipolar disorder Continue home Lamictal 25 mg p.o. daily and Seroquel 200 mg p.o. nightly  Hypothyroidism Continue levothyroxine 50 mcg p.o. daily  Hyperlipidemia: Continue atorvastatin 20 mg p.o. daily  GERD/Barrett's esophagus: Continue PPI  Hypomagnesemia: Repleted today   DVT prophylaxis: SCDs, chemical DVT prophylaxis contraindicated in acute SAH Code Status: Full code Family Communication: Patient extensively at bedside  Disposition Plan:  Status is: Inpatient  Remains inpatient appropriate because:Ongoing active pain requiring inpatient pain management   Dispo: The patient is from: Home              Anticipated d/c is to: Home              Anticipated d/c date is: 1 day              Patient currently is not medically stable to d/c.    Consultants:   PCCM -signed off 05/04/20  Neurosurgery signed off 05/03/20  Procedures:   None  Antimicrobials:   None   Subjective: Patient seen and examined at bedside, lying in bed.  Continues with headache.  States pain medication, oral and IV ineffective.  Spouse present at bedside.  Updated patient and spouse at bedside regarding results of MRI last night, notable subarachnoid hemorrhage is stable  and reduced in size in comparison with recent CT head.   Discussed we will continue to uptitrate oral pain medication with anticipation of discharge home tomorrow.  No other complaints or concerns at this time.  Denies visual changes, no chest pain, no palpitations, no shortness of breath, no abdominal pain.  No acute events overnight per nursing staff.  Objective: Vitals:   05/04/20 2327 05/05/20 0332 05/05/20 0835 05/05/20 1024  BP: (!) 176/91 131/66 (!) 141/76 (!) 155/83  Pulse: 62 65 (!) 58 65  Resp: 17 16 18    Temp: 98.1 F (36.7 C) 99.1 F (37.3 C) 98.4 F (36.9 C)   TempSrc: Oral Oral Oral   SpO2: 100% 99% 96%   Weight:      Height:        Intake/Output Summary (Last 24 hours) at 05/05/2020 1115 Last data filed at 05/04/2020 1812 Gross per 24 hour  Intake 120 ml  Output --  Net 120 ml   Filed Weights   04/30/20 2021  Weight: 95.3 kg    Examination:  General exam: Appears calm and comfortable, but complaining of severe headache Respiratory system: Clear to auscultation. Respiratory effort normal.  Oxygenating well on room air Cardiovascular system: S1 & S2 heard, RRR. No JVD, murmurs, rubs, gallops or clicks. No pedal edema. Gastrointestinal system: Abdomen is nondistended, soft and nontender. No organomegaly or masses felt. Normal bowel sounds heard. Central nervous system: Alert and oriented. No focal neurological deficits. Extremities: Symmetric 5 x 5 power. Skin: No rashes, lesions or ulcers Psychiatry: Judgement and insight appear normal. Mood & affect appropriate.     Data Reviewed: I have personally reviewed following labs and imaging studies  CBC: Recent Labs  Lab 04/30/20 2038 05/01/20 0007 05/01/20 0026 05/01/20 0500 05/02/20 0452 05/04/20 0339  WBC 4.6 5.1  --  3.1* 9.3 3.9*  HGB 12.9* 13.3 12.9* 13.3 12.6* 12.2*  HCT 36.0* 36.5* 38.0* 35.7* 35.5* 32.6*  MCV 100.3* 100.0  --  99.7 100.6* 99.1  PLT 107* 111*  --  106* 127* [id number]*   Basic Metabolic Panel: Recent Labs  Lab 04/30/20 2038 05/01/20 0007  05/01/20 0026 05/01/20 0500 05/02/20 0452 05/04/20 0339  NA 141  --  146* 141 140 139  K 3.8  --  4.1 4.3 4.4 3.9  CL 106  --  105 106 102 103  CO2 23  --   --  21* 23 24  GLUCOSE 93  --  106* 113* 113* 100*  BUN 10  --  11 10 20 12   CREATININE 1.16  --  1.50* 1.10 1.32* 1.06  CALCIUM 9.0  --   --  9.0 9.2 8.9  MG  --  1.5*  --  1.3* 1.9 1.6*  PHOS  --  4.0  --  3.7 4.1 3.1   GFR: Estimated Creatinine Clearance: 72.2 mL/min (by C-G formula based on SCr of 1.06 mg/dL). Liver Function Tests: Recent Labs  Lab 04/30/20 2038  AST 29  ALT 15  ALKPHOS 51  BILITOT 0.9  PROT 5.8*  ALBUMIN 3.6   No results for input(s): LIPASE, AMYLASE in the last 168 hours. No results for input(s): AMMONIA in the last 168 hours. Coagulation Profile: Recent Labs  Lab 05/01/20 0007  INR 1.0   Cardiac Enzymes: No results for input(s): CKTOTAL, CKMB, CKMBINDEX, TROPONINI in the last 168 hours. BNP (last 3 results) No results for input(s): PROBNP in the last 8760 hours. HbA1C: No results for input(s): HGBA1C in the  last 72 hours. CBG: Recent Labs  Lab 05/01/20 2040  GLUCAP 81   Lipid Profile: No results for input(s): CHOL, HDL, LDLCALC, TRIG, CHOLHDL, LDLDIRECT in the last 72 hours. Thyroid Function Tests: No results for input(s): TSH, T4TOTAL, FREET4, T3FREE, THYROIDAB in the last 72 hours. Anemia Panel: No results for input(s): VITAMINB12, FOLATE, FERRITIN, TIBC, IRON, RETICCTPCT in the last 72 hours. Sepsis Labs: No results for input(s): PROCALCITON, LATICACIDVEN in the last 168 hours.  Recent Results (from the past 240 hour(s))  Resp Panel by RT-PCR (Flu A&B, Covid) Nasopharyngeal Swab     Status: None   Collection Time: 05/01/20 12:08 AM   Specimen: Nasopharyngeal Swab; Nasopharyngeal(NP) swabs in vial transport medium  Result Value Ref Range Status   SARS Coronavirus 2 by RT PCR NEGATIVE NEGATIVE Final    Comment: (NOTE) SARS-CoV-2 target nucleic acids are NOT DETECTED.  The  SARS-CoV-2 RNA is generally detectable in upper respiratory specimens during the acute phase of infection. The lowest concentration of SARS-CoV-2 viral copies this assay can detect is 138 copies/mL. A negative result does not preclude SARS-Cov-2 infection and should not be used as the sole basis for treatment or other patient management decisions. A negative result may occur with  improper specimen collection/handling, submission of specimen other than nasopharyngeal swab, presence of viral mutation(s) within the areas targeted by this assay, and inadequate number of viral copies(<138 copies/mL). A negative result must be combined with clinical observations, patient history, and epidemiological information. The expected result is Negative.  Fact Sheet for Patients:  EntrepreneurPulse.com.au  Fact Sheet for Healthcare Providers:  IncredibleEmployment.be  This test is no t yet approved or cleared by the [place] FDA and  has been authorized for detection and/or diagnosis of SARS-CoV-2 by FDA under an Emergency Use Authorization (EUA). This EUA will remain  in effect (meaning this test can be used) for the duration of the COVID-19 declaration under Section 564(b)(1) of the Act, 21 U.S.C.section 360bbb-3(b)(1), unless the authorization is terminated  or revoked sooner.       Influenza A by PCR NEGATIVE NEGATIVE Final   Influenza B by PCR NEGATIVE NEGATIVE Final    Comment: (NOTE) The Xpert Xpress SARS-CoV-2/FLU/RSV plus assay is intended as an aid in the diagnosis of influenza from Nasopharyngeal swab specimens and should not be used as a sole basis for treatment. Nasal washings and aspirates are unacceptable for Xpert Xpress SARS-CoV-2/FLU/RSV testing.  Fact Sheet for Patients: EntrepreneurPulse.com.au  Fact Sheet for Healthcare Providers: IncredibleEmployment.be  This test is not yet approved or  cleared by the [place] FDA and has been authorized for detection and/or diagnosis of SARS-CoV-2 by FDA under an Emergency Use Authorization (EUA). This EUA will remain in effect (meaning this test can be used) for the duration of the COVID-19 declaration under Section 564(b)(1) of the Act, 21 U.S.C. section 360bbb-3(b)(1), unless the authorization is terminated or revoked.  Performed at [facility], [place] [address] [place], [place] [address]   MRSA PCR Screening     Status: None   Collection Time: 05/02/20  3:32 AM   Specimen: Nasal Mucosa; Nasopharyngeal  Result Value Ref Range Status   MRSA by PCR NEGATIVE NEGATIVE Final    Comment:        The GeneXpert MRSA Assay (FDA approved for NASAL specimens only), is one component of a comprehensive MRSA colonization surveillance program. It is not intended to diagnose MRSA infection nor to guide or monitor treatment for MRSA infections. Performed  at [facility], [place] [address], [place], [place] [address]          Radiology Studies: MR BRAIN WO CONTRAST  Result Date: 05/05/2020 CLINICAL DATA:  Initial evaluation for headache, subarachnoid hemorrhage. EXAM: MRI HEAD WITHOUT CONTRAST TECHNIQUE: Multiplanar, multiecho pulse sequences of the brain and surrounding structures were obtained without intravenous contrast. COMPARISON:  Prior CTs from 05/02/2020 as well as earlier studies. FINDINGS: Brain: Advanced cerebral atrophy for age. Mild scattered T2/FLAIR hyperintensities noted within the periventricular and deep white matter both cerebral hemispheres, nonspecific, but most like related chronic microvascular ischemic disease, minimal for age. No abnormal foci of restricted diffusion to suggest acute or subacute ischemia. Gray-white matter differentiation maintained. No encephalomalacia to suggest chronic cortical infarction. Pituitary gland within normal limits. Midline structures intact. Scattered subarachnoid  hemorrhage again seen throughout the brain. Preponderance of the blood products is situated within the basilar cisterns, with extension into the left greater than right sylvian fissures. Hemorrhage extends into the left perimesencephalic cistern. Overall, this is likely slightly decreased as compared to prior head CT from 05/02/2020. Trace intraventricular hemorrhage consistent with redistribution. Stable ventricular size without worsened hydrocephalus or ventriculomegaly. Scattered small volume extra-axial hemorrhage overlies the left greater than right cerebral hemispheres, with extension along the falx. Superimposed chronic bilateral subdural hygromas measuring up to approximately 5 mm bilaterally noted. No midline shift. Basilar cisterns remain patent. Vascular: Major intracranial vascular flow voids are maintained. Skull and upper cervical spine: Craniocervical junction within normal limits. Bone marrow signal intensity normal. No visible scalp soft tissue abnormality. Sinuses/Orbits: Globes and orbital soft tissues within normal limits. Paranasal sinuses are largely clear. Trace bilateral mastoid effusions, of doubtful significance. Other: None. IMPRESSION: 1. Scattered subarachnoid hemorrhage throughout the brain, overall slightly decreased as compared to prior head CT from 05/02/2020. 2. Trace intraventricular hemorrhage consistent with redistribution. Stable ventricular size without worsened hydrocephalus or ventriculomegaly. 3. Scattered small volume extra-axial hemorrhage overlying the left greater than right cerebral hemispheres with extension along the falx. Superimposed chronic bilateral subdural hygromas measuring up to approximately 5 mm without midline shift or significant mass effect. 4. Underlying advanced cerebral atrophy for age. Electronically Signed   By: Jeannine Boga M.D.   On: 05/05/2020 00:08        Scheduled Meds: . atorvastatin  20 mg Oral QHS  . Chlorhexidine Gluconate  Cloth  6 each Topical Daily  . folic acid  1 mg Oral Daily  . lamoTRIgine  25 mg Oral Daily  . levETIRAcetam  1,000 mg Oral BID  . levothyroxine  50 mcg Oral QAC breakfast  . metoprolol succinate  50 mg Oral Daily  . multivitamin with minerals  1 tablet Oral Daily  . pantoprazole  40 mg Oral Daily  . QUEtiapine  200 mg Oral QHS  . thiamine  100 mg Oral Daily   Continuous Infusions:   LOS: 5 days    Time spent: 35 minutes spent on chart review, discussion with nursing staff, consultants, updating family and interview/physical exam; more than 50% of that time was spent in counseling and/or coordination of care.    Nael Petrosyan J [place] DO [facility] Available via Epic secure chat 7am-7pm After these hours, please refer to coverage provider listed on [email or web address] 05/05/2020, 11:15 AM

## 2020-05-06 ENCOUNTER — Inpatient Hospital Stay (HOSPITAL_COMMUNITY): Payer: Medicare Other

## 2020-05-06 DIAGNOSIS — F1010 Alcohol abuse, uncomplicated: Secondary | ICD-10-CM | POA: Diagnosis not present

## 2020-05-06 DIAGNOSIS — I10 Essential (primary) hypertension: Secondary | ICD-10-CM | POA: Diagnosis present

## 2020-05-06 LAB — BASIC METABOLIC PANEL
Anion gap: 10 (ref 5–15)
BUN: 14 mg/dL (ref 8–23)
CO2: 22 mmol/L (ref 22–32)
Calcium: 8.7 mg/dL — ABNORMAL LOW (ref 8.9–10.3)
Chloride: 98 mmol/L (ref 98–111)
Creatinine, Ser: 1.09 mg/dL (ref 0.61–1.24)
GFR, Estimated: >60 mL/min (ref >60)
Glucose, Bld: 88 mg/dL (ref 70–99)
Potassium: 3.8 mmol/L (ref 3.5–5.1)
Sodium: 130 mmol/L — ABNORMAL LOW (ref 135–145)

## 2020-05-06 MED ORDER — HYDROMORPHONE HCL 1 MG/ML IJ SOLN
1.0000 mg | INTRAMUSCULAR | Status: DC | PRN
  Administered 2020-05-06 – 2020-05-09 (×12): 1 mg via INTRAVENOUS
  Filled 2020-05-06 (×12): qty 1

## 2020-05-06 MED ORDER — ALPRAZOLAM 0.5 MG PO TABS
0.5000 mg | ORAL_TABLET | Freq: Three times a day (TID) | ORAL | Status: DC | PRN
  Administered 2020-05-06 – 2020-05-08 (×3): 0.5 mg via ORAL
  Filled 2020-05-06 (×3): qty 1

## 2020-05-06 MED ORDER — FENTANYL 25 MCG/HR TD PT72
1.0000 | MEDICATED_PATCH | TRANSDERMAL | Status: DC
  Administered 2020-05-06 – 2020-05-09 (×2): 1 via TRANSDERMAL
  Filled 2020-05-06 (×2): qty 1

## 2020-05-06 MED ORDER — PROMETHAZINE HCL 25 MG/ML IJ SOLN
12.5000 mg | Freq: Four times a day (QID) | INTRAMUSCULAR | Status: DC | PRN
  Administered 2020-05-06: 12.5 mg via INTRAVENOUS
  Filled 2020-05-06: qty 1

## 2020-05-06 MED ORDER — LOSARTAN POTASSIUM 25 MG PO TABS
25.0000 mg | ORAL_TABLET | Freq: Once | ORAL | Status: AC
  Administered 2020-05-06: 25 mg via ORAL
  Filled 2020-05-06: qty 1

## 2020-05-06 MED ORDER — LOSARTAN POTASSIUM 50 MG PO TABS: 75.0000 mg | ORAL_TABLET | Freq: Every day | ORAL | Status: DC

## 2020-05-06 NOTE — Progress Notes (Signed)
PROGRESS NOTE    Jeffrey Acosta  [id number] DOB: [birth year] DOA: 04/30/2020 PCP: Colon Branch, MD    Brief Narrative:  Jeffrey Acosta is a 70 year old male with past medical history significant for EtOH abuse, bipolar affective disorder, diverticulosis, GERD, gout, migraine headache, hyperlipidemia, hypothyroidism, melanoma of skin, osteopenia and polyarthralgia who was admitted on 04/30/2020 with subarachnoid hemorrhage after collapsing at home with loss of consciousness. CT head notable for subarachnoid hemorrhage.  CT angiogram head negative for aneurysm or AV malformation.  Neurosurgery was consulted and admitted under the PCCM service.  Patient was transferred from the PCCM service to hospital service on 05/04/2020.   Assessment & Plan:   Active Problems:   Alcohol abuse   Subarachnoid hemorrhage (HCC)   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) Patient presenting from home following loss of consciousness.  CT head notable for moderate/large amount of acute subarachnoid/extra-axial hemorrhage mostly concentrated at the suprasellar cistern.  CTA head/neck negative for aneurysm or other vascular abnormality.  Repeat CT head on 05/02/2020 with unchanged subarachnoid hemorrhage with minimal intraventricular reflux.  Patient did develop cranial nerve palsy that resolved and none since initial episode.  Neurosurgery was consulted and followed during hospital course.  MR brain 1/7 with scattered subarachnoid hemorrhage, decreased compared to CT dated 05/02/2020 with stable ventricular size without worsening hydrocephalus or ventriculomegaly, does note events cerebral atrophy for age. Repeat CT head 1/10 for worsening headache with continued improvement in subarachnoid hemorrhage and no new areas of hemorrhage or infarction identified.  No further recommendations from neurosurgery, Dr. Ellene Route and can follow-up outpatient in 2 weeks.  -- Fentanyl patch 25 mcg transdermal every 72  hours --Morphine 1-2mg  IV as needed severe breakthrough pain --Restarted home metoprolol succinate at reduced dose 50 mg p.o. daily, unlikely able to uptitrate further given borderline bradycardia at current dose --Increase Losartan to 75mg  PO daily for better BP control --Hydralazine 25 mg p.o. every 6 hours as needed SBP >150 --Discontinued home aspirin  EtOH use disorder Discussed with patient need for complete EtOH cessation. --CIWA protocol with symptom triggered Ativan --Continue multivitamin, folic acid, thiamine  Essential hypertension:  --Continue metoprolol succinate 50mg  p.o. daily --started losartan 25mg  po daily --hydralazine prn as above  Chronic thrombocytopenia Platelet count 119 at time of discharge.  Etiology likely secondary to chronic EtOH use disorder.  Bipolar disorder Continue home Lamictal 25 mg p.o. daily and Seroquel 200 mg p.o. nightly  Hypothyroidism Continue levothyroxine 50 mcg p.o. daily  Hyperlipidemia: Continue atorvastatin 20 mg p.o. daily  GERD/Barrett's esophagus: Continue PPI  Hypomagnesemia: Repleted    DVT prophylaxis: SCDs, chemical DVT prophylaxis contraindicated in acute SAH Code Status: Full code Family Communication: Patient extensively at bedside  Disposition Plan:  Status is: Inpatient  Remains inpatient appropriate because:Ongoing active pain requiring inpatient pain management   Dispo: The patient is from: Home              Anticipated d/c is to: Home              Anticipated d/c date is: 1 day              Patient currently is not medically stable to d/c.  Continues with pain uncontrolled with oral agents.  Consultants:   PCCM -signed off 05/04/20  Neurosurgery signed off 05/03/20  Procedures:   None  Antimicrobials:   None   Subjective: Patient seen and examined at bedside, lying in bed.  Continues with headache.  States current pain regimen  still ineffective despite increased yesterday.  States  unable to go home today.  No focal neurological changes other than persistent headache.  Repeat CT head this morning shows improvement of subarachnoid hemorrhage without any other focal areas of infarction/hemorrhage.  Discussed with patient and spouse present at bedside, will discontinue oral medications and start fentanyl patch.  No other complaints or concerns at this time.  Denies visual changes, no chest pain, no palpitations, no shortness of breath, no abdominal pain.  No acute events overnight per nursing staff.  Objective: Vitals:   05/06/20 0438 05/06/20 0500 05/06/20 0615 05/06/20 1132  BP: 137/68  (!) 144/58 (!) 165/76  Pulse: 64  61 62  Resp: 14   18  Temp: (!) 97.4 F (36.3 C)   99 F (37.2 C)  TempSrc: Oral   Oral  SpO2: 100%   100%  Weight:  91.9 kg    Height:       No intake or output data in the 24 hours ending 05/06/20 1201 Filed Weights   04/30/20 2021 05/06/20 0500  Weight: 95.3 kg 91.9 kg    Examination:  General exam: Appears calm and comfortable, but complaining of severe headache Respiratory system: Clear to auscultation. Respiratory effort normal.  Oxygenating well on room air Cardiovascular system: S1 & S2 heard, RRR. No JVD, murmurs, rubs, gallops or clicks. No pedal edema. Gastrointestinal system: Abdomen is nondistended, soft and nontender. No organomegaly or masses felt. Normal bowel sounds heard. Central nervous system: Alert and oriented. CN 2-12 intact. No focal neurological deficits. Extremities: Symmetric 5 x 5 power. Skin: No rashes, lesions or ulcers Psychiatry: Judgement and insight appear normal. Mood & affect appropriate.     Data Reviewed: I have personally reviewed following labs and imaging studies  CBC: Recent Labs  Lab 04/30/20 2038 05/01/20 0007 05/01/20 0026 05/01/20 0500 05/02/20 0452 05/04/20 0339  WBC 4.6 5.1  --  3.1* 9.3 3.9*  HGB 12.9* 13.3 12.9* 13.3 12.6* 12.2*  HCT 36.0* 36.5* 38.0* 35.7* 35.5* 32.6*  MCV 100.3*  100.0  --  99.7 100.6* 99.1  PLT 107* 111*  --  106* 127* 295*   Basic Metabolic Panel: Recent Labs  Lab 04/30/20 2038 05/01/20 0007 05/01/20 0026 05/01/20 0500 05/02/20 0452 05/04/20 0339 05/06/20 0446  NA 141  --  146* 141 140 139 130*  K 3.8  --  4.1 4.3 4.4 3.9 3.8  CL 106  --  105 106 102 103 98  CO2 23  --   --  21* 23 24 22   GLUCOSE 93  --  106* 113* 113* 100* 88  BUN 10  --  11 10 20 12 14   CREATININE 1.16  --  1.50* 1.10 1.32* 1.06 1.09  CALCIUM 9.0  --   --  9.0 9.2 8.9 8.7*  MG  --  1.5*  --  1.3* 1.9 1.6*  --   PHOS  --  4.0  --  3.7 4.1 3.1  --    GFR: Estimated Creatinine Clearance: 70.2 mL/min (by C-G formula based on SCr of 1.09 mg/dL). Liver Function Tests: Recent Labs  Lab 04/30/20 2038  AST 29  ALT 15  ALKPHOS 51  BILITOT 0.9  PROT 5.8*  ALBUMIN 3.6   No results for input(s): LIPASE, AMYLASE in the last 168 hours. No results for input(s): AMMONIA in the last 168 hours. Coagulation Profile: Recent Labs  Lab 05/01/20 0007  INR 1.0   Cardiac Enzymes: No results for input(s): CKTOTAL,  CKMB, CKMBINDEX, TROPONINI in the last 168 hours. BNP (last 3 results) No results for input(s): PROBNP in the last 8760 hours. HbA1C: No results for input(s): HGBA1C in the last 72 hours. CBG: Recent Labs  Lab 05/01/20 2040  GLUCAP 81   Lipid Profile: No results for input(s): CHOL, HDL, LDLCALC, TRIG, CHOLHDL, LDLDIRECT in the last 72 hours. Thyroid Function Tests: No results for input(s): TSH, T4TOTAL, FREET4, T3FREE, THYROIDAB in the last 72 hours. Anemia Panel: No results for input(s): VITAMINB12, FOLATE, FERRITIN, TIBC, IRON, RETICCTPCT in the last 72 hours. Sepsis Labs: No results for input(s): PROCALCITON, LATICACIDVEN in the last 168 hours.  Recent Results (from the past 240 hour(s))  Resp Panel by RT-PCR (Flu A&B, Covid) Nasopharyngeal Swab     Status: None   Collection Time: 05/01/20 12:08 AM   Specimen: Nasopharyngeal Swab; Nasopharyngeal(NP)  swabs in vial transport medium  Result Value Ref Range Status   SARS Coronavirus 2 by RT PCR NEGATIVE NEGATIVE Final    Comment: (NOTE) SARS-CoV-2 target nucleic acids are NOT DETECTED.  The SARS-CoV-2 RNA is generally detectable in upper respiratory specimens during the acute phase of infection. The lowest concentration of SARS-CoV-2 viral copies this assay can detect is 138 copies/mL. A negative result does not preclude SARS-Cov-2 infection and should not be used as the sole basis for treatment or other patient management decisions. A negative result may occur with  improper specimen collection/handling, submission of specimen other than nasopharyngeal swab, presence of viral mutation(s) within the areas targeted by this assay, and inadequate number of viral copies(<138 copies/mL). A negative result must be combined with clinical observations, patient history, and epidemiological information. The expected result is Negative.  Fact Sheet for Patients:  EntrepreneurPulse.com.au  Fact Sheet for Healthcare Providers:  IncredibleEmployment.be  This test is no t yet approved or cleared by the [place] FDA and  has been authorized for detection and/or diagnosis of SARS-CoV-2 by FDA under an Emergency Use Authorization (EUA). This EUA will remain  in effect (meaning this test can be used) for the duration of the COVID-19 declaration under Section 564(b)(1) of the Act, 21 U.S.C.section 360bbb-3(b)(1), unless the authorization is terminated  or revoked sooner.       Influenza A by PCR NEGATIVE NEGATIVE Final   Influenza B by PCR NEGATIVE NEGATIVE Final    Comment: (NOTE) The Xpert Xpress SARS-CoV-2/FLU/RSV plus assay is intended as an aid in the diagnosis of influenza from Nasopharyngeal swab specimens and should not be used as a sole basis for treatment. Nasal washings and aspirates are unacceptable for Xpert Xpress  SARS-CoV-2/FLU/RSV testing.  Fact Sheet for Patients: EntrepreneurPulse.com.au  Fact Sheet for Healthcare Providers: IncredibleEmployment.be  This test is not yet approved or cleared by the [place] FDA and has been authorized for detection and/or diagnosis of SARS-CoV-2 by FDA under an Emergency Use Authorization (EUA). This EUA will remain in effect (meaning this test can be used) for the duration of the COVID-19 declaration under Section 564(b)(1) of the Act, 21 U.S.C. section 360bbb-3(b)(1), unless the authorization is terminated or revoked.  Performed at [facility], [place] [address], [place], [place] [address]   MRSA PCR Screening     Status: None   Collection Time: 05/02/20  3:32 AM   Specimen: Nasal Mucosa; Nasopharyngeal  Result Value Ref Range Status   MRSA by PCR NEGATIVE NEGATIVE Final    Comment:        The GeneXpert MRSA Assay (FDA approved for NASAL  specimens only), is one component of a comprehensive MRSA colonization surveillance program. It is not intended to diagnose MRSA infection nor to guide or monitor treatment for MRSA infections. Performed at [facility], [place] [address] [place], [place] [address]          Radiology Studies: CT HEAD WO CONTRAST  Result Date: 05/06/2020 CLINICAL DATA:  Subarachnoid hemorrhage. Headache. CT a negative for aneurysm. EXAM: CT HEAD WITHOUT CONTRAST TECHNIQUE: Contiguous axial images were obtained from the base of the skull through the vertex without intravenous contrast. COMPARISON:  CT head 05/02/2020.  MRI head 05/04/2020 FINDINGS: Brain: Interval improvement in subarachnoid hemorrhage which is most prominent in the basilar cisterns. There is also subarachnoid hemorrhage extending into the left sylvian fissure. There is a small amount of subarachnoid hemorrhage in the subdural hygroma on the left which is unchanged. Bilateral subdural hygroma left greater than  right unchanged. No midline shift. No new area of hemorrhage. No acute infarct or hydrocephalus. Previously noted ventricular hemorrhage no longer identified. Vascular: Negative for hyperdense vessel. Basilar obscured by subarachnoid hemorrhage. Skull: Negative Sinuses/Orbits: Paranasal sinuses clear.  Negative orbit. Other: None IMPRESSION: Continued improvement in subarachnoid hemorrhage. No new area of hemorrhage or infarction identified. Electronically Signed   By: Franchot Gallo M.D.   On: 05/06/2020 11:22   MR BRAIN WO CONTRAST  Result Date: 05/05/2020 CLINICAL DATA:  Initial evaluation for headache, subarachnoid hemorrhage. EXAM: MRI HEAD WITHOUT CONTRAST TECHNIQUE: Multiplanar, multiecho pulse sequences of the brain and surrounding structures were obtained without intravenous contrast. COMPARISON:  Prior CTs from 05/02/2020 as well as earlier studies. FINDINGS: Brain: Advanced cerebral atrophy for age. Mild scattered T2/FLAIR hyperintensities noted within the periventricular and deep white matter both cerebral hemispheres, nonspecific, but most like related chronic microvascular ischemic disease, minimal for age. No abnormal foci of restricted diffusion to suggest acute or subacute ischemia. Gray-white matter differentiation maintained. No encephalomalacia to suggest chronic cortical infarction. Pituitary gland within normal limits. Midline structures intact. Scattered subarachnoid hemorrhage again seen throughout the brain. Preponderance of the blood products is situated within the basilar cisterns, with extension into the left greater than right sylvian fissures. Hemorrhage extends into the left perimesencephalic cistern. Overall, this is likely slightly decreased as compared to prior head CT from 05/02/2020. Trace intraventricular hemorrhage consistent with redistribution. Stable ventricular size without worsened hydrocephalus or ventriculomegaly. Scattered small volume extra-axial hemorrhage overlies  the left greater than right cerebral hemispheres, with extension along the falx. Superimposed chronic bilateral subdural hygromas measuring up to approximately 5 mm bilaterally noted. No midline shift. Basilar cisterns remain patent. Vascular: Major intracranial vascular flow voids are maintained. Skull and upper cervical spine: Craniocervical junction within normal limits. Bone marrow signal intensity normal. No visible scalp soft tissue abnormality. Sinuses/Orbits: Globes and orbital soft tissues within normal limits. Paranasal sinuses are largely clear. Trace bilateral mastoid effusions, of doubtful significance. Other: None. IMPRESSION: 1. Scattered subarachnoid hemorrhage throughout the brain, overall slightly decreased as compared to prior head CT from 05/02/2020. 2. Trace intraventricular hemorrhage consistent with redistribution. Stable ventricular size without worsened hydrocephalus or ventriculomegaly. 3. Scattered small volume extra-axial hemorrhage overlying the left greater than right cerebral hemispheres with extension along the falx. Superimposed chronic bilateral subdural hygromas measuring up to approximately 5 mm without midline shift or significant mass effect. 4. Underlying advanced cerebral atrophy for age. Electronically Signed   By: Jeannine Boga M.D.   On: 05/05/2020 00:08        Scheduled Meds: . atorvastatin  20 mg Oral QHS  . Chlorhexidine Gluconate Cloth  6 each Topical Daily  . fentaNYL  1 patch Transdermal Q72H  . folic acid  1 mg Oral Daily  . lamoTRIgine  25 mg Oral Daily  . levETIRAcetam  1,000 mg Oral BID  . levothyroxine  50 mcg Oral QAC breakfast  . losartan  50 mg Oral Daily  . metoprolol succinate  50 mg Oral Daily  . multivitamin with minerals  1 tablet Oral Daily  . pantoprazole  40 mg Oral Daily  . QUEtiapine  200 mg Oral QHS  . thiamine  100 mg Oral Daily   Continuous Infusions:   LOS: 6 days    Time spent: 35 minutes spent on chart review,  discussion with nursing staff, consultants, updating family and interview/physical exam; more than 50% of that time was spent in counseling and/or coordination of care.    Seung Nidiffer J [place] DO [facility] Available via Epic secure chat 7am-7pm After these hours, please refer to coverage provider listed on [email or web address] 05/06/2020, 12:01 PM

## 2020-05-06 NOTE — Care Management Important Message (Signed)
Important Message  Patient Details  Name: Jeffrey Acosta MRN: [id number] Date of Birth: [birth year]   Medicare Important Message Given:  Yes     Gift Rueckert [place] 05/06/2020, 3:47 PM

## 2020-05-06 NOTE — Progress Notes (Signed)
PT Cancellation Note  Patient Details Name: Jeffrey Acosta MRN: [id number] DOB: 17-Nov-1950   Cancelled Treatment:    Reason Eval/Treat Not Completed: Pain limiting ability to participate (Headache). Pt has had persistent HA but currently too much to mobilize. Will check back tomorrow.   Leighton Roach, [place]  Pager [phone] Office [place] 05/06/2020, 1:12 PM

## 2020-05-06 NOTE — Progress Notes (Signed)
Patient ID: Jeffrey Acosta, male   DOB: 18-Feb-1951, 70 y.o.   MRN: [id number] Vital signs are stable Patient seems a bit out of it and a little bit confused Nonetheless with cueing he becomes oriented Does not complain of significant headache I believe his subarachnoid hemorrhage is resolving itself When stable can make arrangements for discharge from neurosurgical standpoint

## 2020-05-07 DIAGNOSIS — F1010 Alcohol abuse, uncomplicated: Secondary | ICD-10-CM | POA: Diagnosis not present

## 2020-05-07 LAB — CBC
HCT: 36.6 % — ABNORMAL LOW (ref 39.0–52.0)
Hemoglobin: 13.7 g/dL (ref 13.0–17.0)
MCH: 35.5 pg — ABNORMAL HIGH (ref 26.0–34.0)
MCHC: 37.4 g/dL — ABNORMAL HIGH (ref 30.0–36.0)
MCV: 94.8 fL (ref 80.0–100.0)
Platelets: 147 10*3/uL — ABNORMAL LOW (ref 150–400)
RBC: 3.86 MIL/uL — ABNORMAL LOW (ref 4.22–5.81)
RDW: 13 % (ref 11.5–15.5)
WBC: 7.8 10*3/uL (ref 4.0–10.5)
nRBC: 0 % (ref 0.0–0.2)

## 2020-05-07 LAB — BASIC METABOLIC PANEL
Anion gap: 11 (ref 5–15)
BUN: 17 mg/dL (ref 8–23)
CO2: 24 mmol/L (ref 22–32)
Calcium: 9.1 mg/dL (ref 8.9–10.3)
Chloride: 94 mmol/L — ABNORMAL LOW (ref 98–111)
Creatinine, Ser: 1.03 mg/dL (ref 0.61–1.24)
GFR, Estimated: >60 mL/min (ref >60)
Glucose, Bld: 108 mg/dL — ABNORMAL HIGH (ref 70–99)
Potassium: 3.9 mmol/L (ref 3.5–5.1)
Sodium: 129 mmol/L — ABNORMAL LOW (ref 135–145)

## 2020-05-07 LAB — MAGNESIUM: Magnesium: 1.9 mg/dL (ref 1.7–2.4)

## 2020-05-07 MED ORDER — SODIUM CHLORIDE 0.9 % IV BOLUS
1000.0000 mL | Freq: Once | INTRAVENOUS | Status: AC
  Administered 2020-05-07: 1000 mL via INTRAVENOUS

## 2020-05-07 MED ORDER — SODIUM CHLORIDE 0.9 % IV SOLN: INTRAVENOUS | Status: DC

## 2020-05-07 MED ORDER — PROMETHAZINE HCL 25 MG/ML IJ SOLN
12.5000 mg | Freq: Four times a day (QID) | INTRAMUSCULAR | Status: DC | PRN
  Filled 2020-05-07: qty 1

## 2020-05-07 MED ORDER — LOSARTAN POTASSIUM 50 MG PO TABS
100.0000 mg | ORAL_TABLET | Freq: Every day | ORAL | Status: DC
  Administered 2020-05-07 – 2020-05-10 (×4): 100 mg via ORAL
  Filled 2020-05-07 (×5): qty 2

## 2020-05-07 MED ORDER — PROCHLORPERAZINE EDISYLATE 10 MG/2ML IJ SOLN: 10.0000 mg | INTRAMUSCULAR | Status: DC | PRN

## 2020-05-07 NOTE — Progress Notes (Signed)
Physical Therapy Treatment Patient Details Name: Jeffrey Acosta MRN: [id number] DOB: 1950-05-28 Today's Date: 05/07/2020    History of Present Illness The pt is a 70 yo male presenting from home after a fall at home. 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 PMH includes: alcohol use, bipolar affective disorder,  hypothyroidism, atrial tachycardia.    PT Comments    Patient continues to report headache and not feeling well. Max encouragement and coaxing needed to participate in session. Pt more confused today with poor attention, slow processing, difficulty following multi step commands without max repetition, and poor awareness of deficits/safety and how they will impact overall functional mobility. Pt now requires Mod A to stand from EOB and declines ambulation, "I just can't right now." Reports some dizziness with standing. Discharge recommendation updated to home with HHPT pending pt makes improvement, however if he does not and continues to get weaker, he may need SNF. Will follow.    Follow Up Recommendations  Home health PT;Supervision for mobility/OOB (vs SNF pending improvement)     Equipment Recommendations  Rolling walker with 5" wheels;3in1 (PT)    Recommendations for Other Services       Precautions / Restrictions Precautions Precautions: Fall;Other (comment) Precaution Comments: bradycardia Restrictions Weight Bearing Restrictions: No    Mobility  Bed Mobility Overal bed mobility: Needs Assistance Bed Mobility: Supine to Sit;Sit to Supine     Supine to sit: Modified independent (Device/Increase time);HOB elevated Sit to supine: Modified independent (Device/Increase time);HOB elevated   General bed mobility comments: increased time and use of bed rails, but no assist. Reports dizziness.  Transfers Overall transfer level: Needs assistance Equipment used: Rolling walker (2 wheeled);1 person hand held  assist Transfers: Sit to/from Stand Sit to Stand: Mod assist         General transfer comment: Attempted to perform x3 without assist with no success, Mod A needed to stand from EOB with cues for hand placement. performed once without use of RW and once with RW for support. + dizziness. Declined further attempts and placed self back in bed.  Ambulation/Gait             General Gait Details: Unable/unwilling   Stairs             Wheelchair Mobility    Modified Rankin (Stroke Patients Only) Modified Rankin (Stroke Patients Only) Pre-Morbid Rankin Score: No symptoms Modified Rankin: Moderately severe disability     Balance Overall balance assessment: Needs assistance Sitting-balance support: Feet supported;No upper extremity supported Sitting balance-Leahy Scale: Good     Standing balance support: During functional activity Standing balance-Leahy Scale: Poor Standing balance comment: Requires UE support vs external support for standing                            Cognition Arousal/Alertness: Awake/alert Behavior During Therapy: Flat affect Overall Cognitive Status: Impaired/Different from baseline Area of Impairment: Attention;Memory;Safety/judgement;Problem solving;Following commands                   Current Attention Level: Focused Memory: Decreased short-term memory Following Commands: Follows one step commands with increased time (repetition) Safety/Judgement: Decreased awareness of deficits;Decreased awareness of safety   Problem Solving: Slow processing;Decreased initiation;Requires verbal cues General Comments: Oriented x4. Pt with poor attention today requring max repetition to answer questions/perform tasks. Per wife, pt appears more confused today and not like himself. Seems internally distracted by  headache. Poor understanding of medical condition, being in bed and how this is going to affect overall strength/mobility. "I just  can't, I just can't" but not able to elaborate. Slow processing. Needs max encouragement to participate/engage.      Exercises      General Comments General comments (skin integrity, edema, etc.): Wife present during session and attempting to encourage pt as well without success. HR 50s-70 bpm during session.      Pertinent Vitals/Pain Pain Assessment: Faces Faces Pain Scale: Hurts whole lot Pain Location: headache Pain Descriptors / Indicators: Headache Pain Intervention(s): Monitored during session;Premedicated before session;Limited activity within patient's tolerance;Repositioned    Home Living                      Prior Function            PT Goals (current goals can now be found in the care plan section) Progress towards PT goals: Not progressing toward goals - comment (due to cognition? pain? not feeling well)    Frequency    Min 4X/week      PT Plan Discharge plan needs to be updated    Co-evaluation              AM-PAC PT "6 Clicks" Mobility   Outcome Measure  Help needed turning from your back to your side while in a flat bed without using bedrails?: None Help needed moving from lying on your back to sitting on the side of a flat bed without using bedrails?: None Help needed moving to and from a bed to a chair (including a wheelchair)?: A Lot Help needed standing up from a chair using your arms (e.g., wheelchair or bedside chair)?: A Lot Help needed to walk in hospital room?: A Little Help needed climbing 3-5 steps with a railing? : A Lot 6 Click Score: 17    End of Session Equipment Utilized During Treatment: Gait belt Activity Tolerance: Other (comment) (limited due to pain, cognition?) Patient left: in bed;with call bell/phone within reach;with bed alarm set;with family/visitor present Nurse Communication: Mobility status PT Visit Diagnosis: Unsteadiness on feet (R26.81);Other abnormalities of gait and mobility (R26.89)     Time:  3419-3790 PT Time Calculation (min) (ACUTE ONLY): 15 min  Charges:  $Therapeutic Activity: 8-22 mins                     Marisa Severin, PT, DPT Acute Rehabilitation Services Pager [phone] Office [phone]       Jeffrey Acosta 05/07/2020, 11:43 AM

## 2020-05-07 NOTE — Progress Notes (Signed)
PROGRESS NOTE    Jeffrey Acosta  BZJ:[id number] DOB: June 19, 1950 DOA: 04/30/2020 PCP: Colon Branch, MD    Brief Narrative:  Jeffrey Acosta is a 70 year old male with past medical history significant for EtOH abuse, bipolar affective disorder, diverticulosis, GERD, gout, migraine headache, hyperlipidemia, hypothyroidism, melanoma of skin, osteopenia and polyarthralgia who was admitted on 04/30/2020 with subarachnoid hemorrhage after collapsing at home with loss of consciousness. CT head notable for subarachnoid hemorrhage.  CT angiogram head negative for aneurysm or AV malformation.  Neurosurgery was consulted and admitted under the PCCM service.  Patient was transferred from the PCCM service to hospital service on 05/04/2020.   Assessment & Plan:   Principal Problem:   Subarachnoid hemorrhage (HCC) Active Problems:   Alcohol abuse   Bipolar I disorder (HCC)   Hypothyroidism   Benign essential HTN   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) Patient presenting from home following loss of consciousness.  CT head notable for moderate/large amount of acute subarachnoid/extra-axial hemorrhage mostly concentrated at the suprasellar cistern.  CTA head/neck negative for aneurysm or other vascular abnormality.  Repeat CT head on 05/02/2020 with unchanged subarachnoid hemorrhage with minimal intraventricular reflux.  Patient did develop cranial nerve palsy that resolved and none since initial episode.  Neurosurgery was consulted and followed during hospital course.  MR brain 1/7 with scattered subarachnoid hemorrhage, decreased compared to CT dated 05/02/2020 with stable ventricular size without worsening hydrocephalus or ventriculomegaly, does note events cerebral atrophy for age. Repeat CT head 1/10 for worsening headache with continued improvement in subarachnoid hemorrhage and no new areas of hemorrhage or infarction identified.  No further recommendations from neurosurgery, Dr. Ellene Route  and can follow-up outpatient in 2 weeks.  --Fentanyl patch 25 mcg transdermal every 72 hours --Dilaudid 1 mg IV every 4 hours as needed severe breakthrough pain --Restarted home metoprolol succinate at reduced dose 50 mg p.o. daily, unlikely able to uptitrate further given borderline bradycardia at current dose --Increase Losartan to 100mg  PO daily for better BP control --Hydralazine 25 mg p.o. every 6 hours as needed SBP >150 --Compazine 10mg  IV q4h prn N/V --Phenergan 12.5 mg IV q6h prn refractory N/V --Xanax 0.5 mg p.o. 3 times daily as needed anxiety --Discontinued home aspirin  EtOH use disorder Discussed with patient need for complete EtOH cessation. --CIWA protocol with symptom triggered Ativan --Continue multivitamin, folic acid, thiamine  Hyponatremia, likely hypovolemic Etiology likely secondary to poor oral intake and associated nausea/vomiting. --NS Bolus x1 L followed by NS at 75 mL/h --repeat BMP in a.m.  Essential hypertension:  --Continue metoprolol succinate 50mg  p.o. daily --Losartan 100mg  po daily --hydralazine prn as above  Chronic thrombocytopenia Platelet count 147 today.  Etiology likely secondary to chronic EtOH use disorder.  Bipolar disorder Continue home Lamictal 25 mg p.o. daily and Seroquel 200 mg p.o. nightly  Hypothyroidism Continue levothyroxine 50 mcg p.o. daily  Hyperlipidemia: Continue atorvastatin 20 mg p.o. daily  GERD/Barrett's esophagus: Continue PPI  Hypomagnesemia: Repleted    DVT prophylaxis: SCDs, chemical DVT prophylaxis contraindicated in acute SAH Code Status: Full code Family Communication: Discussed with patient acceptedly at bedside, wife not present this morning.  Disposition Plan:  Status is: Inpatient  Remains inpatient appropriate because:Ongoing active pain requiring inpatient pain management   Dispo: The patient is from: Home              Anticipated d/c is to: Home              Anticipated d/c date  is:  1 day              Patient currently is not medically stable to d/c.  Continues with pain uncontrolled with oral agents, and intractable nausea/vomiting with poor oral intake  Consultants:   PCCM -signed off 05/04/20  Neurosurgery signed off 05/03/20  Procedures:   None  Antimicrobials:   None   Subjective: Patient seen and examined at bedside, lying in bed.  Sleeping but arousable.  States headache continues to be intractable with no effect from pain medications.  Also reports nausea/vomiting and unable to tolerate oral intake.  Seen by neurosurgery once again yesterday evening with no further recommendations.  Repeat CT head shows improvement of subarachnoid hemorrhage yesterday.  No focal neurological findings.  Spouse not present at bedside this morning.  Discussed with patient starting IV fluids due to his low sodium level and poor oral intake.  Also will change antiemetic to Compazine to see if this is more effective. No other complaints or concerns at this time.  Denies visual changes, no chest pain, no palpitations, no shortness of breath, no abdominal pain.  No acute events overnight per nursing staff.  Objective: Vitals:   05/06/20 2319 05/07/20 0306 05/07/20 0905 05/07/20 1051  BP: 131/68 133/66 (!) 154/74   Pulse: 60 62 (!) 57 (!) 52  Resp: 18 18 16    Temp: 98.6 F (37 C) 98.3 F (36.8 C) 98.4 F (36.9 C)   TempSrc: Oral Oral Oral   SpO2: 100% 99% 99%   Weight:  91.5 kg    Height:        Intake/Output Summary (Last 24 hours) at 05/07/2020 1117 Last data filed at 05/06/2020 1430 Gross per 24 hour  Intake 120 ml  Output 200 ml  Net -80 ml   Filed Weights   04/30/20 2021 05/06/20 0500 05/07/20 0306  Weight: 95.3 kg 91.9 kg 91.5 kg    Examination:  General exam: Appears calm and comfortable, but complaining of severe headache, chronically ill in appearance Respiratory system: Clear to auscultation. Respiratory effort normal.  Oxygenating well on room  air Cardiovascular system: S1 & S2 heard, RRR. No JVD, murmurs, rubs, gallops or clicks. No pedal edema. Gastrointestinal system: Abdomen is nondistended, soft and nontender. No organomegaly or masses felt. Normal bowel sounds heard. Central nervous system: Alert and oriented. CN 2-12 intact. No focal neurological deficits. Extremities: Symmetric 5 x 5 power. Skin: No rashes, lesions or ulcers Psychiatry: Judgement and insight appear poor. Mood & affect appropriate.     Data Reviewed: I have personally reviewed following labs and imaging studies  CBC: Recent Labs  Lab 05/01/20 0007 05/01/20 0026 05/01/20 0500 05/02/20 0452 05/04/20 0339 05/07/20 0446  WBC 5.1  --  3.1* 9.3 3.9* 7.8  HGB 13.3 12.9* 13.3 12.6* 12.2* 13.7  HCT 36.5* 38.0* 35.7* 35.5* 32.6* 36.6*  MCV 100.0  --  99.7 100.6* 99.1 94.8  PLT 111*  --  106* 127* 119* [id number]*   Basic Metabolic Panel: Recent Labs  Lab 05/01/20 0007 05/01/20 0026 05/01/20 0500 05/02/20 0452 05/04/20 0339 05/06/20 0446 05/07/20 0446  NA  --    < > 141 140 139 130* 129*  K  --    < > 4.3 4.4 3.9 3.8 3.9  CL  --    < > 106 102 103 98 94*  CO2  --   --  21* 23 24 22 24   GLUCOSE  --    < > 113* 113* 100* 88 108*  BUN  --    < > 10 20 12 14 17   CREATININE  --    < > 1.10 1.32* 1.06 1.09 1.03  CALCIUM  --   --  9.0 9.2 8.9 8.7* 9.1  MG 1.5*  --  1.3* 1.9 1.6*  --  1.9  PHOS 4.0  --  3.7 4.1 3.1  --   --    < > = values in this interval not displayed.   GFR: Estimated Creatinine Clearance: 74.3 mL/min (by C-G formula based on SCr of 1.03 mg/dL). Liver Function Tests: Recent Labs  Lab 04/30/20 2038  AST 29  ALT 15  ALKPHOS 51  BILITOT 0.9  PROT 5.8*  ALBUMIN 3.6   No results for input(s): LIPASE, AMYLASE in the last 168 hours. No results for input(s): AMMONIA in the last 168 hours. Coagulation Profile: Recent Labs  Lab 05/01/20 0007  INR 1.0   Cardiac Enzymes: No results for input(s): CKTOTAL, CKMB, CKMBINDEX,  TROPONINI in the last 168 hours. BNP (last 3 results) No results for input(s): PROBNP in the last 8760 hours. HbA1C: No results for input(s): HGBA1C in the last 72 hours. CBG: Recent Labs  Lab 05/01/20 2040  GLUCAP 81   Lipid Profile: No results for input(s): CHOL, HDL, LDLCALC, TRIG, CHOLHDL, LDLDIRECT in the last 72 hours. Thyroid Function Tests: No results for input(s): TSH, T4TOTAL, FREET4, T3FREE, THYROIDAB in the last 72 hours. Anemia Panel: No results for input(s): VITAMINB12, FOLATE, FERRITIN, TIBC, IRON, RETICCTPCT in the last 72 hours. Sepsis Labs: No results for input(s): PROCALCITON, LATICACIDVEN in the last 168 hours.  Recent Results (from the past 240 hour(s))  Resp Panel by RT-PCR (Flu A&B, Covid) Nasopharyngeal Swab     Status: None   Collection Time: 05/01/20 12:08 AM   Specimen: Nasopharyngeal Swab; Nasopharyngeal(NP) swabs in vial transport medium  Result Value Ref Range Status   SARS Coronavirus 2 by RT PCR NEGATIVE NEGATIVE Final    Comment: (NOTE) SARS-CoV-2 target nucleic acids are NOT DETECTED.  The SARS-CoV-2 RNA is generally detectable in upper respiratory specimens during the acute phase of infection. The lowest concentration of SARS-CoV-2 viral copies this assay can detect is 138 copies/mL. A negative result does not preclude SARS-Cov-2 infection and should not be used as the sole basis for treatment or other patient management decisions. A negative result may occur with  improper specimen collection/handling, submission of specimen other than nasopharyngeal swab, presence of viral mutation(s) within the areas targeted by this assay, and inadequate number of viral copies(<138 copies/mL). A negative result must be combined with clinical observations, patient history, and epidemiological information. The expected result is Negative.  Fact Sheet for Patients:  EntrepreneurPulse.com.au  Fact Sheet for Healthcare Providers:   IncredibleEmployment.be  This test is no t yet approved or cleared by the [place] FDA and  has been authorized for detection and/or diagnosis of SARS-CoV-2 by FDA under an Emergency Use Authorization (EUA). This EUA will remain  in effect (meaning this test can be used) for the duration of the COVID-19 declaration under Section 564(b)(1) of the Act, 21 U.S.C.section 360bbb-3(b)(1), unless the authorization is terminated  or revoked sooner.       Influenza A by PCR NEGATIVE NEGATIVE Final   Influenza B by PCR NEGATIVE NEGATIVE Final    Comment: (NOTE) The Xpert Xpress SARS-CoV-2/FLU/RSV plus assay is intended as an aid in the diagnosis of influenza from Nasopharyngeal swab specimens and should not be used as a sole basis  for treatment. Nasal washings and aspirates are unacceptable for Xpert Xpress SARS-CoV-2/FLU/RSV testing.  Fact Sheet for Patients: EntrepreneurPulse.com.au  Fact Sheet for Healthcare Providers: IncredibleEmployment.be  This test is not yet approved or cleared by the [place] FDA and has been authorized for detection and/or diagnosis of SARS-CoV-2 by FDA under an Emergency Use Authorization (EUA). This EUA will remain in effect (meaning this test can be used) for the duration of the COVID-19 declaration under Section 564(b)(1) of the Act, 21 U.S.C. section 360bbb-3(b)(1), unless the authorization is terminated or revoked.  Performed at [facility], [place] [address], [place], [place] [address]   MRSA PCR Screening     Status: None   Collection Time: 05/02/20  3:32 AM   Specimen: Nasal Mucosa; Nasopharyngeal  Result Value Ref Range Status   MRSA by PCR NEGATIVE NEGATIVE Final    Comment:        The GeneXpert MRSA Assay (FDA approved for NASAL specimens only), is one component of a comprehensive MRSA colonization surveillance program. It is not intended to diagnose MRSA infection nor  to guide or monitor treatment for MRSA infections. Performed at [facility], [place] [address], [place], [place] [address]          Radiology Studies: CT HEAD WO CONTRAST  Result Date: 05/06/2020 CLINICAL DATA:  Subarachnoid hemorrhage. Headache. CT a negative for aneurysm. EXAM: CT HEAD WITHOUT CONTRAST TECHNIQUE: Contiguous axial images were obtained from the base of the skull through the vertex without intravenous contrast. COMPARISON:  CT head 05/02/2020.  MRI head 05/04/2020 FINDINGS: Brain: Interval improvement in subarachnoid hemorrhage which is most prominent in the basilar cisterns. There is also subarachnoid hemorrhage extending into the left sylvian fissure. There is a small amount of subarachnoid hemorrhage in the subdural hygroma on the left which is unchanged. Bilateral subdural hygroma left greater than right unchanged. No midline shift. No new area of hemorrhage. No acute infarct or hydrocephalus. Previously noted ventricular hemorrhage no longer identified. Vascular: Negative for hyperdense vessel. Basilar obscured by subarachnoid hemorrhage. Skull: Negative Sinuses/Orbits: Paranasal sinuses clear.  Negative orbit. Other: None IMPRESSION: Continued improvement in subarachnoid hemorrhage. No new area of hemorrhage or infarction identified. Electronically Signed   By: Franchot Gallo M.D.   On: 05/06/2020 11:22        Scheduled Meds: . atorvastatin  20 mg Oral QHS  . Chlorhexidine Gluconate Cloth  6 each Topical Daily  . fentaNYL  1 patch Transdermal Q72H  . folic acid  1 mg Oral Daily  . lamoTRIgine  25 mg Oral Daily  . levETIRAcetam  1,000 mg Oral BID  . levothyroxine  50 mcg Oral QAC breakfast  . losartan  100 mg Oral Daily  . metoprolol succinate  50 mg Oral Daily  . multivitamin with minerals  1 tablet Oral Daily  . pantoprazole  40 mg Oral Daily  . QUEtiapine  200 mg Oral QHS  . thiamine  100 mg Oral Daily   Continuous Infusions: . sodium chloride        LOS: 7 days    Time spent: 38 minutes spent on chart review, discussion with nursing staff, consultants, updating family and interview/physical exam; more than 50% of that time was spent in counseling and/or coordination of care.    Odyssey Vasbinder J [place] DO [facility] Available via Epic secure chat 7am-7pm After these hours, please refer to coverage provider listed on [email or web address] 05/07/2020, 11:17 AM

## 2020-05-07 NOTE — Progress Notes (Signed)
Occupational Therapy Treatment Patient Details Name: Jeffrey Acosta MRN: [id number] DOB: [birth year] Today's Date: 05/07/2020    History of present illness 70 yo male presenting from home after a fall at home. 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 PMH includes: alcohol use, bipolar affective disorder,  hypothyroidism, atrial tachycardia.   OT comments  Upon arrival, pt supine in bed and resting with wife at bedside. Pt agreeable to OOB to sink/bathroom for ADLs. Pt requiring Min A for balance during functional mobility. Pt attempting to use bathroom but because very dizzy and needing to sit; transfer to [facility]. BP sitting 168/124 (134). Return to bed and supine: BP 177/103 (123); RN made aware. Update dc to home with HHOT; pending progress may need SNF due to decreased balance, cognition, and safety. Will continue to follow acutely as admitted.    Follow Up Recommendations  Home health OT;Supervision/Assistance - 24 hour;SNF    Equipment Recommendations  None recommended by OT    Recommendations for Other Services      Precautions / Restrictions Precautions Precautions: Fall;Other (comment) Precaution Comments: bradycardia; elevated BP       Mobility Bed Mobility Overal bed mobility: Needs Assistance Bed Mobility: Supine to Sit;Sit to Supine     Supine to sit: Supervision Sit to supine: Supervision   General bed mobility comments: Supervision for safety  Transfers Overall transfer level: Needs assistance Equipment used: 1 person hand held assist Transfers: Sit to/from Stand Sit to Stand: Min assist         General transfer comment: Min A for gaining balance in standing.    Balance Overall balance assessment: Needs assistance Sitting-balance support: Feet supported;No upper extremity supported Sitting balance-Leahy Scale: Good     Standing balance support: During functional activity Standing balance-Leahy Scale:  Poor Standing balance comment: Requires UE support vs external support for standing                           ADL either performed or assessed with clinical judgement   ADL Overall ADL's : Needs assistance/impaired                         Toilet Transfer: Minimal assistance;Ambulation;BSC Toilet Transfer Details (indicate cue type and reason): Pt requiring Min A for balance and safety Toileting- Clothing Manipulation and Hygiene: Minimal assistance Toileting - Clothing Manipulation Details (indicate cue type and reason): Min A to manage gown     Functional mobility during ADLs: Minimal assistance;Min guard General ADL Comments: Pt presenting with poor balance, cognition, and safety impacting his safe performance of BADLs     Vision       Perception     Praxis      Cognition Arousal/Alertness: Awake/alert Behavior During Therapy: Flat affect Overall Cognitive Status: Impaired/Different from baseline Area of Impairment: Attention;Memory;Safety/judgement;Problem solving;Following commands                   Current Attention Level: Focused;Sustained (Very short sustained attention) Memory: Decreased short-term memory Following Commands: Follows one step commands with increased time;Follows one step commands inconsistently (repetition) Safety/Judgement: Decreased awareness of deficits;Decreased awareness of safety   Problem Solving: Slow processing;Decreased initiation;Requires verbal cues General Comments: Decreased verbalization. Requiring increased time and cues throughout. Appearing to "zone out" and requiring multiple cues. Pt agreeable to therapy and OOB. However, with mobility to.from bathroom pt becoming dizzy and very distractable.  Exercises     Shoulder Instructions       General Comments Wife present throughout session. BP after walking to bathroom 168/124 (134) and BP once in bed supine 177/103 (123); RN made aware.     Pertinent Vitals/ Pain       Pain Assessment: Faces Faces Pain Scale: Hurts even more Pain Location: headache Pain Descriptors / Indicators: Headache Pain Intervention(s): Limited activity within patient's tolerance;Monitored during session;Repositioned  Home Living                                          Prior Functioning/Environment              Frequency  Min 2X/week        Progress Toward Goals  OT Goals(current goals can now be found in the care plan section)  Progress towards OT goals: Not progressing toward goals - comment (Limited by dizzienss this session)  Acute Rehab OT Goals Patient Stated Goal: to go home today OT Goal Formulation: With patient Time For Goal Achievement: 05/15/20 Potential to Achieve Goals: Good ADL Goals Additional ADL Goal #1: pt will complete a 5 step path finding task mod I Additional ADL Goal #2: pt will complete 2 dynamic balance task reaching more than 5 inches out of base of support mod i  Plan Discharge plan needs to be updated    Co-evaluation                 AM-PAC OT "6 Clicks" Daily Activity     Outcome Measure   Help from another person eating meals?: None Help from another person taking care of personal grooming?: A Little Help from another person toileting, which includes using toliet, bedpan, or urinal?: A Little Help from another person bathing (including washing, rinsing, drying)?: A Little Help from another person to put on and taking off regular upper body clothing?: None Help from another person to put on and taking off regular lower body clothing?: None 6 Click Score: 21    End of Session    OT Visit Diagnosis: Unsteadiness on feet (R26.81)   Activity Tolerance Other (comment) (Limited by dizziness)   Patient Left in bed;with call bell/phone within reach;with bed alarm set;with family/visitor present   Nurse Communication Mobility status;Precautions        Time:  6195-0932 OT Time Calculation (min): 18 min  Charges: OT General Charges $OT Visit: 1 Visit OT Treatments $Self Care/Home Management : 8-22 mins  Rayen Dafoe MSOT, OTR/L Acute Rehab Pager: [phone] Office: [place] 05/07/2020, 5:18 PM

## 2020-05-07 NOTE — Progress Notes (Signed)
OT Cancellation Note  Patient Details Name: Jeffrey Acosta MRN: [id number] DOB: 01-05-1951   Cancelled Treatment:    Reason Eval/Treat Not Completed: Patient declined, no reason specified (Significant HA. Will return as schedule allows.)  [place], OTR/L Acute Rehab Pager: [phone] Office: [phone] 05/07/2020, 9:15 AM

## 2020-05-07 NOTE — Progress Notes (Signed)
PT Cancellation Note  Patient Details Name: Jeffrey Acosta MRN: [id number] DOB: December 16, 1950   Cancelled Treatment:    Reason Eval/Treat Not Completed: Pain limiting ability to participate Attempted to see pt twice this AM but declined due to headache despite getting medicine. Will follow.   Marguarite Arbour A Sheilyn Boehlke 05/07/2020, 9:08 AM Marisa Severin, PT, DPT Acute Rehabilitation Services Pager [phone] Office [phone]

## 2020-05-08 DIAGNOSIS — F1010 Alcohol abuse, uncomplicated: Secondary | ICD-10-CM | POA: Diagnosis not present

## 2020-05-08 LAB — BASIC METABOLIC PANEL
Anion gap: 11 (ref 5–15)
BUN: 16 mg/dL (ref 8–23)
CO2: 23 mmol/L (ref 22–32)
Calcium: 8.3 mg/dL — ABNORMAL LOW (ref 8.9–10.3)
Chloride: 95 mmol/L — ABNORMAL LOW (ref 98–111)
Creatinine, Ser: 0.99 mg/dL (ref 0.61–1.24)
GFR, Estimated: >60 mL/min (ref >60)
Glucose, Bld: 110 mg/dL — ABNORMAL HIGH (ref 70–99)
Potassium: 3.4 mmol/L — ABNORMAL LOW (ref 3.5–5.1)
Sodium: 129 mmol/L — ABNORMAL LOW (ref 135–145)

## 2020-05-08 LAB — TROPONIN I (HIGH SENSITIVITY): Troponin I (High Sensitivity): 12 ng/L (ref <18)

## 2020-05-08 LAB — MAGNESIUM: Magnesium: 1.8 mg/dL (ref 1.7–2.4)

## 2020-05-08 MED ORDER — AMLODIPINE BESYLATE 5 MG PO TABS
5.0000 mg | ORAL_TABLET | Freq: Every day | ORAL | Status: DC
  Administered 2020-05-08 – 2020-05-09 (×2): 5 mg via ORAL
  Filled 2020-05-08 (×2): qty 1

## 2020-05-08 MED ORDER — MAGNESIUM SULFATE 2 GM/50ML IV SOLN
2.0000 g | Freq: Once | INTRAVENOUS | Status: AC
  Administered 2020-05-08: 2 g via INTRAVENOUS
  Filled 2020-05-08: qty 50

## 2020-05-08 MED ORDER — POTASSIUM CHLORIDE CRYS ER 20 MEQ PO TBCR
40.0000 meq | EXTENDED_RELEASE_TABLET | Freq: Once | ORAL | Status: AC
  Administered 2020-05-08: 40 meq via ORAL
  Filled 2020-05-08: qty 2

## 2020-05-08 NOTE — NC FL2 (Signed)
[place] LEVEL OF CARE SCREENING TOOL     IDENTIFICATION  Patient Name: Jeffrey Acosta Birthdate: 1950/08/21 Sex: male Admission Date (Current Location): 04/30/2020  [facility] and [place] Number:  Herbalist and Address:  [facility] [place], [place] [address] [place], [place] [address]      Provider Number: [id number]  Attending Physician Name and Address:  [place] Donnamarie Poag, DO  Relative Name and Phone Number:       Current Level of Care: Hospital Recommended Level of Care: [place] Prior Approval Number:    Date Approved/Denied:   PASRR Number:    Discharge Plan: SNF    Current Diagnoses: Patient Active Problem List   Diagnosis Date Noted  . Benign essential HTN 05/06/2020  . Subarachnoid hemorrhage ([place]) 04/30/2020  . Leukopenia 07/15/2018  . Thrombocytopenia ([place]) 07/15/2018  . PCP NOTES >>>>>>> 03/23/2015  . Annual physical exam 03/20/2015  . Bronchospasm 09/06/2014  . DJD (degenerative joint disease) 09/06/2014  . Hypothyroidism 08/28/2014  . Tremor 07/29/2014  . Gait disorder 07/29/2014  . Gout 01/08/2014  . Vitamin D deficiency 11/25/2012  . Melanoma of skin, site unspecified 11/25/2012  . 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 Alcoholism in remission ([place]) 06/19/2011  . Diverticulitis of colon (without mention of hemorrhage)(562.11) 06/19/2011  . Bipolar I disorder ([place]) 06/19/2011  . Osteopenia 05/17/2010  . Alcohol abuse 05/16/2010  . HYPERTRIGLYCERIDEMIA 08/02/2009  . Macrocytic anemia 01/10/2009  . ABNORMAL ELECTROCARDIOGRAM 11/26/2008  . REFLUX, ESOPHAGEAL 11/17/2006  . COMMON MIGRAINE 09/22/2006    Orientation RESPIRATION BLADDER Height & Weight     Self,Place  Normal Continent Weight: 92.3 kg Height:  6\' 1"  (185.4 cm)  BEHAVIORAL SYMPTOMS/MOOD NEUROLOGICAL BOWEL NUTRITION STATUS      Continent Diet (heart healthy with thin liquids)  AMBULATORY STATUS COMMUNICATION OF NEEDS Skin    Limited Assist Verbally Normal                       Personal Care Assistance Level of Assistance  Bathing,Feeding,Dressing Bathing Assistance: Limited assistance Feeding assistance: Independent Dressing Assistance: Limited assistance     Functional Limitations Info  Sight,Hearing,Speech Sight Info: Impaired Hearing Info: Adequate Speech Info: Adequate    SPECIAL CARE FACTORS FREQUENCY  PT (By licensed PT),OT (By licensed OT),Speech therapy     PT Frequency: 5x/wk OT Frequency: 5x/wk     Speech Therapy Frequency: 5x/wk      Contractures Contractures Info: Not present    Additional Factors Info  Code Status,Allergies,Psychotropic Code Status Info: Full Allergies Info: Omnipaque Psychotropic Info: Fentanyl 25 mcg patch change every 72 hours/ Lamictal 25 mg daily/ Seroquel 200 mg at bedtime         Current Medications (05/08/2020):  This is the current hospital active medication list Current Facility-Administered Medications  Medication Dose Route Frequency Provider Last Rate Last Admin  . 0.9 %  sodium chloride infusion   Intravenous Continuous [place] Eric J, DO 75 mL/hr at 05/08/20 0235 New Bag at 05/08/20 0235  . acetaminophen (TYLENOL) tablet 650 mg  650 mg Oral Q6H PRN Rigoberto Noel, MD   650 mg at 05/07/20 2031  . ALPRAZolam Duanne Moron) tablet 0.5 mg  0.5 mg Oral TID PRN [place] Eric J, DO   0.5 mg at 05/07/20 1049  . amLODipine (NORVASC) tablet 5 mg  5 mg Oral Daily [place] Eric J, DO      . atorvastatin (LIPITOR) tablet 20 mg  20 mg Oral QHS Rigoberto Noel, MD   20 mg at 05/07/20 2032  . Chlorhexidine Gluconate Cloth 2 % PADS 6 each  6 each Topical Daily Rigoberto Noel, MD   6 each at 05/02/20 1249  . docusate sodium (COLACE) capsule 100 mg  100 mg Oral BID PRN Rigoberto Noel, MD      . fentaNYL (DURAGESIC) 25 MCG/HR 1 patch  1 patch Transdermal Q72H [place] Eric J, DO   1 patch at 05/06/20 1042  . folic acid (FOLVITE) tablet 1 mg  1 mg Oral Daily Rigoberto Noel, MD   1 mg at 05/08/20 1008  . hydrALAZINE (APRESOLINE) tablet 25 mg  25 mg Oral Q6H PRN [place] Eric J, DO   25 mg at 05/07/20 1612  . HYDROmorphone (DILAUDID) injection 1 mg  1 mg Intravenous Q4H PRN [place] Donnamarie Poag, DO   1 mg at 05/08/20 1433  . lamoTRIgine (LAMICTAL) tablet 25 mg  25 mg Oral Daily Rigoberto Noel, MD   25 mg at 05/08/20 1008  . levothyroxine (SYNTHROID) tablet 50 mcg  50 mcg Oral QAC breakfast Rigoberto Noel, MD   50 mcg at 05/08/20 0750  . losartan (COZAAR) tablet 100 mg  100 mg Oral Daily [place] Donnamarie Poag, DO   100 mg at 05/08/20 1008  . metoprolol succinate (TOPROL-XL) 24 hr tablet 50 mg  50 mg Oral Daily [place] Donnamarie Poag, DO   50 mg at 05/08/20 1009  . multivitamin with minerals tablet 1 tablet  1 tablet Oral Daily Rigoberto Noel, MD   1 tablet at 05/08/20 1008  . pantoprazole (PROTONIX) EC tablet 40 mg  40 mg Oral Daily Rigoberto Noel, MD   40 mg at 05/08/20 1008  . polyethylene glycol (MIRALAX / GLYCOLAX) packet 17 g  17 g Oral Daily PRN Rigoberto Noel, MD      . prochlorperazine (COMPAZINE) injection 10 mg  10 mg Intravenous Q4H PRN [place] Eric J, DO      . promethazine (PHENERGAN) injection 12.5 mg  12.5 mg Intravenous Q6H PRN [place] Eric J, DO      . QUEtiapine (SEROQUEL) tablet 200 mg  200 mg Oral QHS Rigoberto Noel, MD   200 mg at 05/07/20 2032  . thiamine tablet 100 mg  100 mg Oral Daily Rigoberto Noel, MD   100 mg at 05/08/20 1008     Discharge Medications: Please see discharge summary for a list of discharge medications.  Relevant Imaging Results:  Relevant Lab Results:   Additional Information SS#: [id number]  Pollie Friar, RN

## 2020-05-08 NOTE — Progress Notes (Signed)
Patient ID: Jeffrey Acosta, male   DOB: 07-Feb-1951, 70 y.o.   MRN: [id number] Vital signs are stable Patient seems a bit confused This has been his functional status in the last few days Continue supportive care Note magnesium replacement

## 2020-05-08 NOTE — TOC Progression Note (Signed)
Transition of Care [facility]) - Progression Note    Patient Details  Name: Jeffrey Acosta MRN: [id number] Date of Birth: 11-27-1950  Transition of Care [facility]) CM/SW Contact  Pollie Friar, RN Phone Number: 05/08/2020, 5:00 PM  Clinical Narrative:    CM met with the patient and his spouse. At this time the patients spouse doesn't feel she can manage his care at home. She is requesting the patient be faxed out for SNF rehab. She is interested in facilities in [place].  PASAR went to review d/t pt has history of bipolar. Will send in information.  TOC following.   Expected Discharge Plan: OP Rehab Barriers to Discharge: Continued Medical Work up  Expected Discharge Plan and Services Expected Discharge Plan: OP Rehab   Discharge Planning Services: CM Consult   Living arrangements for the past 2 months: Single Family Home Expected Discharge Date: 05/04/20               DME Arranged: 3-N-1,Walker rolling DME Agency: [employer] Date DME Agency Contacted: 05/04/20 Time DME Agency Contacted: [phone] Representative spoke with at DME Agency: Lucrecia             Social Determinants of Health ([place]) Interventions    Readmission Risk Interventions No flowsheet data found.

## 2020-05-08 NOTE — Progress Notes (Signed)
Re: Jeffrey Acosta DOB: 02-16-1951 Date: 05/08/2020   To Whom It May Concern:  Please be advised that the above-named patient will require a short-term nursing home stay--anticipated 30 days or less for rehabilitation and strengthening. The plan is for home.

## 2020-05-08 NOTE — Progress Notes (Signed)
Physical Therapy Treatment Patient Details Name: ELEANOR DIMICHELE MRN: [id number] DOB: May 27, 1950 Today's Date: 05/08/2020    History of Present Illness 70 yo male presenting from home after a fall at home. 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 PMH includes: alcohol use, bipolar affective disorder,  hypothyroidism, atrial tachycardia.    PT Comments    Pt limited by headache, requiring max encouragement to participate in session despite receiving IV pain medicine immediately prior to session. Pt with impaired memory, often requires repeated cues to attend to task at hand. Pt with slowed mobility and instability with standing, requiring intermittent UE support of railing to provide stability. Pt declines further attempts at ambulation or OOB activity after brief periods of activity. PT provides education to pt and spouse on the need for increased activity and time in an upright position. PT continues to recommend HHPT and 24/7 supervision of family. Pt with RW and 3in1 commode already delivered to room.   Follow Up Recommendations  Home health PT;Supervision for mobility/OOB     Equipment Recommendations  Rolling walker with 5" wheels;3in1 (PT)    Recommendations for Other Services       Precautions / Restrictions Precautions Precautions: Fall;Other (comment) Precaution Comments: monitor BP Restrictions Weight Bearing Restrictions: No    Mobility  Bed Mobility Overal bed mobility: Needs Assistance Bed Mobility: Supine to Sit;Sit to Supine     Supine to sit: Supervision Sit to supine: Supervision   General bed mobility comments: increased time, use of railings  Transfers Overall transfer level: Needs assistance Equipment used: None Transfers: Sit to/from [employer] Sit to Stand: Min guard         General transfer comment: pt with reduced safety awareness during SPT to recliner, limited clearance of  buttocks  Ambulation/Gait Ambulation/Gait assistance: Min guard Gait Distance (Feet): 12 Feet (12' x 2) Assistive device:  (intermittent use of bedrail) Gait Pattern/deviations: Step-to pattern Gait velocity: reduced Gait velocity interpretation: <1.31 ft/sec, indicative of household ambulator General Gait Details: increased lateral sway   Stairs             Wheelchair Mobility    Modified Rankin (Stroke Patients Only) Modified Rankin (Stroke Patients Only) Pre-Morbid Rankin Score: No symptoms Modified Rankin: Moderately severe disability     Balance Overall balance assessment: Needs assistance Sitting-balance support: No upper extremity supported;Feet supported Sitting balance-Leahy Scale: Fair     Standing balance support: No upper extremity supported;During functional activity Standing balance-Leahy Scale: Fair                              Cognition Arousal/Alertness: Awake/alert Behavior During Therapy: Flat affect Overall Cognitive Status: Impaired/Different from baseline Area of Impairment: Attention;Memory;Following commands;Safety/judgement;Awareness;Problem solving                   Current Attention Level: Focused Memory: Decreased recall of precautions;Decreased short-term memory Following Commands: Follows one step commands with increased time Safety/Judgement: Decreased awareness of safety;Decreased awareness of deficits Awareness: Intellectual Problem Solving: Slow processing;Decreased initiation;Requires verbal cues;Requires tactile cues        Exercises      General Comments General comments (skin integrity, edema, etc.): spouse present and supportive. Pt reports some dizziness with mobility along with significant HA      Pertinent Vitals/Pain Pain Assessment: 0-10 Pain Score: 10-Worst pain ever Pain Location: head Pain Descriptors / Indicators: Headache Pain Intervention(s):  Premedicated before session    Home  Living                      Prior Function            PT Goals (current goals can now be found in the care plan section) Acute Rehab PT Goals Patient Stated Goal: to reduce headache Progress towards PT goals: Progressing toward goals (slowly, limited by pain)    Frequency    Min 4X/week      PT Plan Current plan remains appropriate    Co-evaluation              AM-PAC PT "6 Clicks" Mobility   Outcome Measure  Help needed turning from your back to your side while in a flat bed without using bedrails?: None Help needed moving from lying on your back to sitting on the side of a flat bed without using bedrails?: None Help needed moving to and from a bed to a chair (including a wheelchair)?: A Little Help needed standing up from a chair using your arms (e.g., wheelchair or bedside chair)?: A Little Help needed to walk in hospital room?: A Little Help needed climbing 3-5 steps with a railing? : A Lot 6 Click Score: 19    End of Session   Activity Tolerance: Patient limited by pain Patient left: in bed;with call bell/phone within reach;with bed alarm set;with family/visitor present Nurse Communication: Mobility status PT Visit Diagnosis: Unsteadiness on feet (R26.81);Other abnormalities of gait and mobility (R26.89)     Time: 1009-1030 PT Time Calculation (min) (ACUTE ONLY): 21 min  Charges:  $Therapeutic Activity: 8-22 mins                     Zenaida Niece, PT, DPT Acute Rehabilitation Pager: [phone]    Zenaida Niece 05/08/2020, 12:26 PM

## 2020-05-08 NOTE — Progress Notes (Signed)
PROGRESS NOTE    Jeffrey Acosta  FXO:[id number] DOB: 14-Feb-1951 DOA: 04/30/2020 PCP: Colon Branch, MD    Brief Narrative:  Jeffrey Acosta is a 70 year old male with past medical history significant for EtOH abuse, bipolar affective disorder, diverticulosis, GERD, gout, migraine headache, hyperlipidemia, hypothyroidism, melanoma of skin, osteopenia and polyarthralgia who was admitted on 04/30/2020 with subarachnoid hemorrhage after collapsing at home with loss of consciousness. CT head notable for subarachnoid hemorrhage.  CT angiogram head negative for aneurysm or AV malformation.  Neurosurgery was consulted and admitted under the PCCM service.  Patient was transferred from the PCCM service to hospital service on 05/04/2020.   Assessment & Plan:   Principal Problem:   Subarachnoid hemorrhage (HCC) Active Problems:   Alcohol abuse   Bipolar I disorder (HCC)   Hypothyroidism   Benign essential HTN   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) Patient presenting from home following loss of consciousness.  CT head notable for moderate/large amount of acute subarachnoid/extra-axial hemorrhage mostly concentrated at the suprasellar cistern.  CTA head/neck negative for aneurysm or other vascular abnormality.  Repeat CT head on 05/02/2020 with unchanged subarachnoid hemorrhage with minimal intraventricular reflux.  Patient did develop cranial nerve palsy that resolved and none since initial episode.  Neurosurgery was consulted and followed during hospital course.  MR brain 1/7 with scattered subarachnoid hemorrhage, decreased compared to CT dated 05/02/2020 with stable ventricular size without worsening hydrocephalus or ventriculomegaly, does note events cerebral atrophy for age. Repeat CT head 1/10 for worsening headache with continued improvement in subarachnoid hemorrhage and no new areas of hemorrhage or infarction identified.  No further recommendations from neurosurgery, Dr. Ellene Route  and can follow-up outpatient in 2 weeks.  --Fentanyl patch 25 mcg transdermal every 72 hours --Dilaudid 1 mg IV every 4 hours as needed severe breakthrough pain --Restarted home metoprolol succinate at reduced dose 50 mg p.o. daily, unlikely able to uptitrate further given borderline bradycardia at current dose --Started on Losartan and increased to 100mg  PO daily for better BP control --Hydralazine 25 mg p.o. every 6 hours as needed SBP >150 --Compazine 10mg  IV q4h prn N/V --Phenergan 12.5 mg IV q6h prn refractory N/V --Xanax 0.5 mg p.o. TID as needed anxiety --Discontinued home aspirin  EtOH use disorder Discussed with patient need for complete EtOH cessation. --CIWA protocol with symptom triggered Ativan --Continue multivitamin, folic acid, thiamine  Hyponatremia, likely hypovolemic Etiology likely secondary to poor oral intake and associated nausea/vomiting. --NS Bolus x1 L followed by NS at 75 mL/h --repeat BMP in a.m.  Essential hypertension:  --Continue metoprolol succinate 50mg  p.o. daily --Losartan 100mg  po daily --hydralazine prn as above  Chronic thrombocytopenia Platelet count 107>>147.  Etiology likely secondary to chronic EtOH use disorder.  Bipolar disorder Continue home Lamictal 25 mg p.o. daily and Seroquel 200 mg p.o. nightly  Hypothyroidism Continue levothyroxine 50 mcg p.o. daily  Hyperlipidemia: Continue atorvastatin 20 mg p.o. daily  GERD/Barrett's esophagus: Continue PPI  Hypomagnesemia:  Magnesium 1.8 today, will replete.  Hypokalemia K 3.4 today, will replete   DVT prophylaxis: SCDs, chemical DVT prophylaxis contraindicated in acute SAH Code Status: Full code Family Communication: Discussed with patient acceptedly at bedside, wife not present this morning.  Disposition Plan:  Status is: Inpatient  Remains inpatient appropriate because:Ongoing active pain requiring inpatient pain management   Dispo: The patient is from: Home               Anticipated d/c is to: Home  Anticipated d/c date is: 1 day              Patient currently is not medically stable to d/c.  Continues with headache, appears slightly improved now he reports intermittent, but still with poor oral intake but nausea and vomiting have improved.  We will continue current plan today, if stable and tolerates more oral intake today we will plan on discharge home tomorrow.  Consultants:   PCCM -signed off 05/04/20  Neurosurgery signed off 05/03/20  Procedures:   None  Antimicrobials:   None   Subjective: Patient seen and examined at bedside, lying in bed.  Sleeping but arousable.  Patient now reports headache intermittent, appears somewhat confused/slow to respond.  No further nausea/vomiting, but reports poor oral intake.  Has not used antiemetics since 05/06/18/2022.  Spouse not present this morning.  Seen once again and followed by neurosurgery with no further recommendations.  No other complaints or concerns at this time.  Denies visual changes, no chest pain, no palpitations, no shortness of breath, no abdominal pain, no weakness, no paresthesias.  Nursing reported episode of A. fib with RVR while patient was going to the bathroom overnight, has self resolved.  Unsure if this was A. fib versus sinus tachycardia.  No other acute concerns overnight per nursing staff.  Objective: Vitals:   05/07/20 2347 05/08/20 0359 05/08/20 0700 05/08/20 1009  BP: 120/72 (!) 147/94 (!) 149/74   Pulse: 70 69 (!) 59 71  Resp:  18 16   Temp: 97.9 F (36.6 C) 97.7 F (36.5 C) 98.7 F (37.1 C)   TempSrc: Oral Oral Oral   SpO2: 100% 99% 100%   Weight:  92.3 kg    Height:        Intake/Output Summary (Last 24 hours) at 05/08/2020 1102 Last data filed at 05/08/2020 0600 Gross per 24 hour  Intake 1180 ml  Output 400 ml  Net 780 ml   Filed Weights   05/06/20 0500 05/07/20 0306 05/08/20 0359  Weight: 91.9 kg 91.5 kg 92.3 kg    Examination:  General  exam: Appears calm and comfortable, chronically ill in appearance Respiratory system: Clear to auscultation. Respiratory effort normal.  Oxygenating well on room air Cardiovascular system: S1 & S2 heard, RRR. No JVD, murmurs, rubs, gallops or clicks. No pedal edema. Gastrointestinal system: Abdomen is nondistended, soft and nontender. No organomegaly or masses felt. Normal bowel sounds heard. Central nervous system: Alert and oriented. CN 2-12 intact. No focal neurological deficits. Extremities: Symmetric 5 x 5 power. Skin: No rashes, lesions or ulcers Psychiatry: Judgement and insight appear poor. Mood & affect appropriate.     Data Reviewed: I have personally reviewed following labs and imaging studies  CBC: Recent Labs  Lab 05/02/20 0452 05/04/20 0339 05/07/20 0446  WBC 9.3 3.9* 7.8  HGB 12.6* 12.2* 13.7  HCT 35.5* 32.6* 36.6*  MCV 100.6* 99.1 94.8  PLT 127* 119* 546*   Basic Metabolic Panel: Recent Labs  Lab 05/02/20 0452 05/04/20 0339 05/06/20 0446 05/07/20 0446 05/08/20 0345  NA 140 139 130* 129* 129*  K 4.4 3.9 3.8 3.9 3.4*  CL 102 103 98 94* 95*  CO2 23 24 22 24 23   GLUCOSE 113* 100* 88 108* 110*  BUN 20 12 14 17 16   CREATININE 1.32* 1.06 1.09 1.03 0.99  CALCIUM 9.2 8.9 8.7* 9.1 8.3*  MG 1.9 1.6*  --  1.9 1.8  PHOS 4.1 3.1  --   --   --  GFR: Estimated Creatinine Clearance: 77.3 mL/min (by C-G formula based on SCr of 0.99 mg/dL). Liver Function Tests: No results for input(s): AST, ALT, ALKPHOS, BILITOT, PROT, ALBUMIN in the last 168 hours. No results for input(s): LIPASE, AMYLASE in the last 168 hours. No results for input(s): AMMONIA in the last 168 hours. Coagulation Profile: No results for input(s): INR, PROTIME in the last 168 hours. Cardiac Enzymes: No results for input(s): CKTOTAL, CKMB, CKMBINDEX, TROPONINI in the last 168 hours. BNP (last 3 results) No results for input(s): PROBNP in the last 8760 hours. HbA1C: No results for input(s): HGBA1C  in the last 72 hours. CBG: Recent Labs  Lab 05/01/20 2040  GLUCAP 81   Lipid Profile: No results for input(s): CHOL, HDL, LDLCALC, TRIG, CHOLHDL, LDLDIRECT in the last 72 hours. Thyroid Function Tests: No results for input(s): TSH, T4TOTAL, FREET4, T3FREE, THYROIDAB in the last 72 hours. Anemia Panel: No results for input(s): VITAMINB12, FOLATE, FERRITIN, TIBC, IRON, RETICCTPCT in the last 72 hours. Sepsis Labs: No results for input(s): PROCALCITON, LATICACIDVEN in the last 168 hours.  Recent Results (from the past 240 hour(s))  Resp Panel by RT-PCR (Flu A&B, Covid) Nasopharyngeal Swab     Status: None   Collection Time: 05/01/20 12:08 AM   Specimen: Nasopharyngeal Swab; Nasopharyngeal(NP) swabs in vial transport medium  Result Value Ref Range Status   SARS Coronavirus 2 by RT PCR NEGATIVE NEGATIVE Final    Comment: (NOTE) SARS-CoV-2 target nucleic acids are NOT DETECTED.  The SARS-CoV-2 RNA is generally detectable in upper respiratory specimens during the acute phase of infection. The lowest concentration of SARS-CoV-2 viral copies this assay can detect is 138 copies/mL. A negative result does not preclude SARS-Cov-2 infection and should not be used as the sole basis for treatment or other patient management decisions. A negative result may occur with  improper specimen collection/handling, submission of specimen other than nasopharyngeal swab, presence of viral mutation(s) within the areas targeted by this assay, and inadequate number of viral copies(<138 copies/mL). A negative result must be combined with clinical observations, patient history, and epidemiological information. The expected result is Negative.  Fact Sheet for Patients:  EntrepreneurPulse.com.au  Fact Sheet for Healthcare Providers:  IncredibleEmployment.be  This test is no t yet approved or cleared by the [place] FDA and  has been authorized for detection and/or  diagnosis of SARS-CoV-2 by FDA under an Emergency Use Authorization (EUA). This EUA will remain  in effect (meaning this test can be used) for the duration of the COVID-19 declaration under Section 564(b)(1) of the Act, 21 U.S.C.section 360bbb-3(b)(1), unless the authorization is terminated  or revoked sooner.       Influenza A by PCR NEGATIVE NEGATIVE Final   Influenza B by PCR NEGATIVE NEGATIVE Final    Comment: (NOTE) The Xpert Xpress SARS-CoV-2/FLU/RSV plus assay is intended as an aid in the diagnosis of influenza from Nasopharyngeal swab specimens and should not be used as a sole basis for treatment. Nasal washings and aspirates are unacceptable for Xpert Xpress SARS-CoV-2/FLU/RSV testing.  Fact Sheet for Patients: EntrepreneurPulse.com.au  Fact Sheet for Healthcare Providers: IncredibleEmployment.be  This test is not yet approved or cleared by the [place] FDA and has been authorized for detection and/or diagnosis of SARS-CoV-2 by FDA under an Emergency Use Authorization (EUA). This EUA will remain in effect (meaning this test can be used) for the duration of the COVID-19 declaration under Section 564(b)(1) of the Act, 21 U.S.C. section 360bbb-3(b)(1), unless the authorization is terminated  or revoked.  Performed at [facility], [place] [address], [place], [place] [address]   MRSA PCR Screening     Status: None   Collection Time: 05/02/20  3:32 AM   Specimen: Nasal Mucosa; Nasopharyngeal  Result Value Ref Range Status   MRSA by PCR NEGATIVE NEGATIVE Final    Comment:        The GeneXpert MRSA Assay (FDA approved for NASAL specimens only), is one component of a comprehensive MRSA colonization surveillance program. It is not intended to diagnose MRSA infection nor to guide or monitor treatment for MRSA infections. Performed at [facility], [place] [address], [place], [place] [address]          Radiology  Studies: No results found.      Scheduled Meds: . atorvastatin  20 mg Oral QHS  . Chlorhexidine Gluconate Cloth  6 each Topical Daily  . fentaNYL  1 patch Transdermal Q72H  . folic acid  1 mg Oral Daily  . lamoTRIgine  25 mg Oral Daily  . levothyroxine  50 mcg Oral QAC breakfast  . losartan  100 mg Oral Daily  . metoprolol succinate  50 mg Oral Daily  . multivitamin with minerals  1 tablet Oral Daily  . pantoprazole  40 mg Oral Daily  . QUEtiapine  200 mg Oral QHS  . thiamine  100 mg Oral Daily   Continuous Infusions: . sodium chloride 75 mL/hr at 05/08/20 0235     LOS: 8 days    Time spent: 36 minutes spent on chart review, discussion with nursing staff, consultants, updating family and interview/physical exam; more than 50% of that time was spent in counseling and/or coordination of care.    Deashia Soule J [place] DO [facility] Available via Epic secure chat 7am-7pm After these hours, please refer to coverage provider listed on [email or web address] 05/08/2020, 11:02 AM

## 2020-05-09 DIAGNOSIS — F1010 Alcohol abuse, uncomplicated: Secondary | ICD-10-CM | POA: Diagnosis not present

## 2020-05-09 LAB — BASIC METABOLIC PANEL
Anion gap: 11 (ref 5–15)
BUN: 10 mg/dL (ref 8–23)
CO2: 23 mmol/L (ref 22–32)
Calcium: 8.6 mg/dL — ABNORMAL LOW (ref 8.9–10.3)
Chloride: 94 mmol/L — ABNORMAL LOW (ref 98–111)
Creatinine, Ser: 0.8 mg/dL (ref 0.61–1.24)
GFR, Estimated: >60 mL/min (ref >60)
Glucose, Bld: 101 mg/dL — ABNORMAL HIGH (ref 70–99)
Potassium: 3.5 mmol/L (ref 3.5–5.1)
Sodium: 128 mmol/L — ABNORMAL LOW (ref 135–145)

## 2020-05-09 LAB — OSMOLALITY: Osmolality: 274 mOsm/kg — ABNORMAL LOW (ref 275–295)

## 2020-05-09 MED ORDER — HYDROMORPHONE HCL 1 MG/ML IJ SOLN
0.5000 mg | INTRAMUSCULAR | Status: DC | PRN
  Administered 2020-05-09 – 2020-05-11 (×3): 0.5 mg via INTRAVENOUS
  Filled 2020-05-09 (×3): qty 0.5

## 2020-05-09 MED ORDER — AMLODIPINE BESYLATE 10 MG PO TABS
10.0000 mg | ORAL_TABLET | Freq: Every day | ORAL | Status: DC
Start: 2020-05-10 — End: 2020-05-11
  Administered 2020-05-10: 10 mg via ORAL
  Filled 2020-05-09: qty 1

## 2020-05-09 MED ORDER — POTASSIUM CHLORIDE CRYS ER 20 MEQ PO TBCR
40.0000 meq | EXTENDED_RELEASE_TABLET | Freq: Once | ORAL | Status: AC
  Administered 2020-05-09: 40 meq via ORAL
  Filled 2020-05-09: qty 2

## 2020-05-09 NOTE — TOC Progression Note (Signed)
Transition of Care [facility]) - Progression Note    Patient Details  Name: Jeffrey Acosta MRN: [id number] Date of Birth: 07/14/1950  Transition of Care [facility]) CM/SW Contact  Pollie Friar, RN Phone Number: 05/09/2020, 12:00 PM  Clinical Narrative:    Pt without any bed offers. Wife provided list of pending offers for SNF rehab.   Must to interview pt at 2:10 pm today for PASAR number.  ToC following.   Expected Discharge Plan: OP Rehab Barriers to Discharge: Continued Medical Work up  Expected Discharge Plan and Services Expected Discharge Plan: OP Rehab   Discharge Planning Services: CM Consult   Living arrangements for the past 2 months: Single Family Home Expected Discharge Date: 05/04/20               DME Arranged: 3-N-1,Walker rolling DME Agency: [employer] Date DME Agency Contacted: 05/04/20 Time DME Agency Contacted: [phone] Representative spoke with at DME Agency: Lucrecia             Social Determinants of Health ([place]) Interventions    Readmission Risk Interventions No flowsheet data found.

## 2020-05-09 NOTE — Progress Notes (Signed)
PROGRESS NOTE    Jeffrey Acosta  [id number] DOB: Jun 06, 1950 DOA: 04/30/2020 PCP: Colon Branch, MD    Brief Narrative:  Jeffrey Acosta is a 70 year old male with past medical history significant for EtOH abuse, bipolar affective disorder, diverticulosis, GERD, gout, migraine headache, hyperlipidemia, hypothyroidism, melanoma of skin, osteopenia and polyarthralgia who was admitted on 04/30/2020 with subarachnoid hemorrhage after collapsing at home with loss of consciousness. CT head notable for subarachnoid hemorrhage.  CT angiogram head negative for aneurysm or AV malformation.  Neurosurgery was consulted and admitted under the PCCM service.  Patient was transferred from the PCCM service to hospital service on 05/04/2020.   Assessment & Plan:   Principal Problem:   Subarachnoid hemorrhage (HCC) Active Problems:   Alcohol abuse   Bipolar I disorder (HCC)   Hypothyroidism   Benign essential HTN   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) Patient presenting from home following loss of consciousness.  CT head notable for moderate/large amount of acute subarachnoid/extra-axial hemorrhage mostly concentrated at the suprasellar cistern.  CTA head/neck negative for aneurysm or other vascular abnormality.  Repeat CT head on 05/02/2020 with unchanged subarachnoid hemorrhage with minimal intraventricular reflux.  Patient did develop cranial nerve palsy that resolved and none since initial episode.  Neurosurgery was consulted and followed during hospital course.  MR brain 1/7 with scattered subarachnoid hemorrhage, decreased compared to CT dated 05/02/2020 with stable ventricular size without worsening hydrocephalus or ventriculomegaly, does note events cerebral atrophy for age. Repeat CT head 1/10 for worsening headache with continued improvement in subarachnoid hemorrhage and no new areas of hemorrhage or infarction identified.  No further recommendations from neurosurgery, Dr. Ellene Route  and can follow-up outpatient in 2 weeks.  --Fentanyl patch 25 mcg transdermal every 72 hours --Reduced Dilaudid to 0.5 mg IV q4h prn severe breakthrough pain --Restarted home metoprolol succinate at reduced dose 50 mg p.o. daily, unlikely able to uptitrate further given borderline bradycardia at current dose --Started on Losartan and increased to 100mg  PO daily for better BP control --Started on amlodipine and increase to 10 mg p.o. daily --Hydralazine 25 mg p.o. every 6 hours as needed SBP >150 --Compazine 10mg  IV q4h prn N/V --Phenergan 12.5 mg IV q6h prn refractory N/V --Xanax 0.5 mg p.o. TID as needed anxiety --Discontinued home aspirin  EtOH use disorder Discussed with patient need for complete EtOH cessation. --CIWA protocol with symptom triggered Ativan --Continue multivitamin, folic acid, thiamine  Hyponatremia, likely hypovolemic vs hypervolemia vs SIADH Etiology likely secondary to poor oral intake and associated nausea/vomiting. --Serum osmolality 274, low; possible volume overload versus SIADH --Urine sodium, urine osmolality: Pending --Discontinued IV fluids --repeat BMP in a.m.  Essential hypertension:  --Continue metoprolol succinate 50mg  p.o. daily --Losartan 100mg  po daily --amlodipine 10mg  PO daily --hydralazine prn as above  Chronic thrombocytopenia Platelet count 107>>147.  Etiology likely secondary to chronic EtOH use disorder.  Bipolar disorder Continue home Lamictal 25 mg p.o. daily and Seroquel 200 mg p.o. nightly  Hypothyroidism Continue levothyroxine 50 mcg p.o. daily  Hyperlipidemia: Continue atorvastatin 20 mg p.o. daily  GERD/Barrett's esophagus: Continue PPI  Hypomagnesemia:  Repleted. -- Magnesium level in the a.m.  Hypokalemia K 3.5 today, will replete   DVT prophylaxis: SCDs, chemical DVT prophylaxis contraindicated in acute SAH Code Status: Full code Family Communication: Discussed with patient at bedside, wife not present  this morning but stated to social work yesterday that cannot handle patient at home and pending SNF placement.  Disposition Plan:  Status is: Inpatient  Remains  inpatient appropriate because:Ongoing active pain requiring inpatient pain management   Dispo: The patient is from: Home              Anticipated d/c is to: SNF              Anticipated d/c date is: 1 day              Patient currently is medically stable to d/c.    Consultants:   PCCM -signed off 05/04/20  Neurosurgery signed off 05/03/20  Procedures:   None  Antimicrobials:   None   Subjective: Patient seen and examined at bedside, lying in bed.  Continues with intermittent headache, poor oral intake.  Ambulated with PT yesterday; with new recommendations of SNF placement.  Spouse not present at bedside this morning.  No other complaints or concerns at this time. Denies visual changes, no chest pain, no palpitations, no shortness of breath, no abdominal pain, no weakness, no paresthesias.  Nursing reported episode of A. fib with RVR while patient was going to the bathroom overnight, has self resolved.  Unsure if this was A. fib versus sinus tachycardia.  No other acute concerns overnight per nursing staff.  Objective: Vitals:   05/08/20 2331 05/09/20 0350 05/09/20 0458 05/09/20 0733  BP: (!) 152/86 (!) 150/80  (!) 179/88  Pulse: 66 66  60  Resp: 17 17  18   Temp: 98 F (36.7 C) 98.2 F (36.8 C)  98.3 F (36.8 C)  TempSrc: Oral Oral  Oral  SpO2: 99% 100%  100%  Weight:   88.5 kg   Height:        Intake/Output Summary (Last 24 hours) at 05/09/2020 1023 Last data filed at 05/09/2020 0600 Gross per 24 hour  Intake 2024.82 ml  Output --  Net 2024.82 ml   Filed Weights   05/07/20 0306 05/08/20 0359 05/09/20 0458  Weight: 91.5 kg 92.3 kg 88.5 kg    Examination:  General exam: Appears calm and comfortable, chronically ill in appearance Respiratory system: Clear to auscultation. Respiratory effort normal.   Oxygenating well on room air Cardiovascular system: S1 & S2 heard, RRR. No JVD, murmurs, rubs, gallops or clicks. No pedal edema. Gastrointestinal system: Abdomen is nondistended, soft and nontender. No organomegaly or masses felt. Normal bowel sounds heard. Central nervous system: Alert and oriented. CN 2-12 intact. No focal neurological deficits. Extremities: Symmetric 5 x 5 power. Skin: No rashes, lesions or ulcers Psychiatry: Judgement and insight appear poor.  Depressed mood & flat affect.     Data Reviewed: I have personally reviewed following labs and imaging studies  CBC: Recent Labs  Lab 05/04/20 0339 05/07/20 0446  WBC 3.9* 7.8  HGB 12.2* 13.7  HCT 32.6* 36.6*  MCV 99.1 94.8  PLT 119* 573*   Basic Metabolic Panel: Recent Labs  Lab 05/04/20 0339 05/06/20 0446 05/07/20 0446 05/08/20 0345 05/09/20 0252  NA 139 130* 129* 129* 128*  K 3.9 3.8 3.9 3.4* 3.5  CL 103 98 94* 95* 94*  CO2 24 22 24 23 23   GLUCOSE 100* 88 108* 110* 101*  BUN 12 14 17 16 10   CREATININE 1.06 1.09 1.03 0.99 0.80  CALCIUM 8.9 8.7* 9.1 8.3* 8.6*  MG 1.6*  --  1.9 1.8  --   PHOS 3.1  --   --   --   --    GFR: Estimated Creatinine Clearance: 95.7 mL/min (by C-G formula based on SCr of 0.8 mg/dL). Liver Function Tests: No results  for input(s): AST, ALT, ALKPHOS, BILITOT, PROT, ALBUMIN in the last 168 hours. No results for input(s): LIPASE, AMYLASE in the last 168 hours. No results for input(s): AMMONIA in the last 168 hours. Coagulation Profile: No results for input(s): INR, PROTIME in the last 168 hours. Cardiac Enzymes: No results for input(s): CKTOTAL, CKMB, CKMBINDEX, TROPONINI in the last 168 hours. BNP (last 3 results) No results for input(s): PROBNP in the last 8760 hours. HbA1C: No results for input(s): HGBA1C in the last 72 hours. CBG: No results for input(s): GLUCAP in the last 168 hours. Lipid Profile: No results for input(s): CHOL, HDL, LDLCALC, TRIG, CHOLHDL, LDLDIRECT in  the last 72 hours. Thyroid Function Tests: No results for input(s): TSH, T4TOTAL, FREET4, T3FREE, THYROIDAB in the last 72 hours. Anemia Panel: No results for input(s): VITAMINB12, FOLATE, FERRITIN, TIBC, IRON, RETICCTPCT in the last 72 hours. Sepsis Labs: No results for input(s): PROCALCITON, LATICACIDVEN in the last 168 hours.  Recent Results (from the past 240 hour(s))  Resp Panel by RT-PCR (Flu A&B, Covid) Nasopharyngeal Swab     Status: None   Collection Time: 05/01/20 12:08 AM   Specimen: Nasopharyngeal Swab; Nasopharyngeal(NP) swabs in vial transport medium  Result Value Ref Range Status   SARS Coronavirus 2 by RT PCR NEGATIVE NEGATIVE Final    Comment: (NOTE) SARS-CoV-2 target nucleic acids are NOT DETECTED.  The SARS-CoV-2 RNA is generally detectable in upper respiratory specimens during the acute phase of infection. The lowest concentration of SARS-CoV-2 viral copies this assay can detect is 138 copies/mL. A negative result does not preclude SARS-Cov-2 infection and should not be used as the sole basis for treatment or other patient management decisions. A negative result may occur with  improper specimen collection/handling, submission of specimen other than nasopharyngeal swab, presence of viral mutation(s) within the areas targeted by this assay, and inadequate number of viral copies(<138 copies/mL). A negative result must be combined with clinical observations, patient history, and epidemiological information. The expected result is Negative.  Fact Sheet for Patients:  EntrepreneurPulse.com.au  Fact Sheet for Healthcare Providers:  IncredibleEmployment.be  This test is no t yet approved or cleared by the [place] FDA and  has been authorized for detection and/or diagnosis of SARS-CoV-2 by FDA under an Emergency Use Authorization (EUA). This EUA will remain  in effect (meaning this test can be used) for the duration of  the COVID-19 declaration under Section 564(b)(1) of the Act, 21 U.S.C.section 360bbb-3(b)(1), unless the authorization is terminated  or revoked sooner.       Influenza A by PCR NEGATIVE NEGATIVE Final   Influenza B by PCR NEGATIVE NEGATIVE Final    Comment: (NOTE) The Xpert Xpress SARS-CoV-2/FLU/RSV plus assay is intended as an aid in the diagnosis of influenza from Nasopharyngeal swab specimens and should not be used as a sole basis for treatment. Nasal washings and aspirates are unacceptable for Xpert Xpress SARS-CoV-2/FLU/RSV testing.  Fact Sheet for Patients: EntrepreneurPulse.com.au  Fact Sheet for Healthcare Providers: IncredibleEmployment.be  This test is not yet approved or cleared by the [place] FDA and has been authorized for detection and/or diagnosis of SARS-CoV-2 by FDA under an Emergency Use Authorization (EUA). This EUA will remain in effect (meaning this test can be used) for the duration of the COVID-19 declaration under Section 564(b)(1) of the Act, 21 U.S.C. section 360bbb-3(b)(1), unless the authorization is terminated or revoked.  Performed at [facility], [place] [address] [place], [place] [address]   MRSA PCR Screening  Status: None   Collection Time: 05/02/20  3:32 AM   Specimen: Nasal Mucosa; Nasopharyngeal  Result Value Ref Range Status   MRSA by PCR NEGATIVE NEGATIVE Final    Comment:        The GeneXpert MRSA Assay (FDA approved for NASAL specimens only), is one component of a comprehensive MRSA colonization surveillance program. It is not intended to diagnose MRSA infection nor to guide or monitor treatment for MRSA infections. Performed at [facility], [place] [address] [place], [place] [address]          Radiology Studies: No results found.      Scheduled Meds: . [START ON 05/10/2020] amLODipine  10 mg Oral Daily  . atorvastatin  20 mg Oral QHS  . Chlorhexidine  Gluconate Cloth  6 each Topical Daily  . fentaNYL  1 patch Transdermal Q72H  . folic acid  1 mg Oral Daily  . lamoTRIgine  25 mg Oral Daily  . levothyroxine  50 mcg Oral QAC breakfast  . losartan  100 mg Oral Daily  . metoprolol succinate  50 mg Oral Daily  . multivitamin with minerals  1 tablet Oral Daily  . pantoprazole  40 mg Oral Daily  . QUEtiapine  200 mg Oral QHS  . thiamine  100 mg Oral Daily   Continuous Infusions:    LOS: 9 days    Time spent: 36 minutes spent on chart review, discussion with nursing staff, consultants, updating family and interview/physical exam; more than 50% of that time was spent in counseling and/or coordination of care.    Jeffrey Acosta J [place] DO [facility] Available via Epic secure chat 7am-7pm After these hours, please refer to coverage provider listed on [email or web address] 05/09/2020, 10:23 AM

## 2020-05-09 NOTE — Progress Notes (Signed)
Physical Therapy Treatment Patient Details Name: Jeffrey Acosta MRN: [id number] DOB: Dec 16, 1950 Today's Date: 05/09/2020    History of Present Illness 70 yo male presenting from home after a fall at home. 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 PMH includes: alcohol use, bipolar affective disorder,  hypothyroidism, atrial tachycardia.    PT Comments    Pt with some improvement in ambulation tolerance, progressing to mobilizing out of the room but continuing to require encouragement to do so. Pt with slowed processing and requires multiple verbal cues and intermittent tactile cues to follow commands and perform desired tasks. Pt spouse present and recognizes improvement however still wary about pt's recent performance. Pt will benefit from continued acute PT POC to improve activity tolerance and safety awareness. PT recommends SNF placement as the pt's spouse is unsure if she will be able to adequately care for the pt in his current state due to cognitive and mobility deficits.   Follow Up Recommendations  SNF;Supervision/Assistance - 24 hour     Equipment Recommendations  Rolling walker with 5" wheels;3in1 (PT)    Recommendations for Other Services       Precautions / Restrictions Precautions Precautions: Fall;Other (comment) Precaution Comments: monitor BP Restrictions Weight Bearing Restrictions: No    Mobility  Bed Mobility Overal bed mobility: Needs Assistance Bed Mobility: Supine to Sit     Supine to sit: Supervision     General bed mobility comments: increased time  Transfers Overall transfer level: Needs assistance Equipment used: Rolling walker (2 wheeled) Transfers: Sit to/from [facility] Sit to Stand: Min guard Stand pivot transfers: Min guard       General transfer comment: PT provides cues for hand placement, requires tactile cueing to initiate first  transfer  Ambulation/Gait Ambulation/Gait assistance: Min guard Gait Distance (Feet): 45 Feet Assistive device: Rolling walker (2 wheeled) Gait Pattern/deviations: Step-to pattern Gait velocity: reduced Gait velocity interpretation: <1.31 ft/sec, indicative of household ambulator General Gait Details: pt with slowed step-to gait, requires PT cueing for direction and encouragement to increase ambulation distance   Stairs             Wheelchair Mobility    Modified Rankin (Stroke Patients Only) Modified Rankin (Stroke Patients Only) Pre-Morbid Rankin Score: No symptoms Modified Rankin: Moderately severe disability     Balance Overall balance assessment: Needs assistance Sitting-balance support: Feet supported;Single extremity supported;No upper extremity supported Sitting balance-Leahy Scale: Fair     Standing balance support: Single extremity supported;Bilateral upper extremity supported Standing balance-Leahy Scale: Poor Standing balance comment: reliant on UE support of RW                            Cognition Arousal/Alertness: Awake/alert Behavior During Therapy: Flat affect Overall Cognitive Status: Impaired/Different from baseline Area of Impairment: Attention;Memory;Following commands;Safety/judgement;Awareness;Problem solving                   Current Attention Level: Focused Memory: Decreased recall of precautions;Decreased short-term memory Following Commands: Follows one step commands with increased time (requires multiple cues) Safety/Judgement: Decreased awareness of safety;Decreased awareness of deficits Awareness: Intellectual Problem Solving: Slow processing;Decreased initiation;Requires verbal cues;Requires tactile cues;Difficulty sequencing        Exercises      General Comments General comments (skin integrity, edema, etc.): VSS on RA      Pertinent Vitals/Pain Pain Assessment: Faces Faces Pain Scale: Hurts even  more Pain Location:  head Pain Descriptors / Indicators: Headache Pain Intervention(s): Monitored during session    Home Living                      Prior Function            PT Goals (current goals can now be found in the care plan section) Acute Rehab PT Goals Patient Stated Goal: to reduce headache Progress towards PT goals: Not progressing toward goals - comment (remains limited by HA)    Frequency    Min 4X/week (may progress to home)      PT Plan Current plan remains appropriate    Co-evaluation              AM-PAC PT "6 Clicks" Mobility   Outcome Measure  Help needed turning from your back to your side while in a flat bed without using bedrails?: A Little Help needed moving from lying on your back to sitting on the side of a flat bed without using bedrails?: A Little Help needed moving to and from a bed to a chair (including a wheelchair)?: A Little Help needed standing up from a chair using your arms (e.g., wheelchair or bedside chair)?: A Little Help needed to walk in hospital room?: A Little Help needed climbing 3-5 steps with a railing? : A Lot 6 Click Score: 17    End of Session   Activity Tolerance: Patient limited by pain (improved activity tolerance but still pain limited) Patient left: in chair;with call bell/phone within reach;with chair alarm set;with family/visitor present Nurse Communication: Mobility status PT Visit Diagnosis: Unsteadiness on feet (R26.81);Other abnormalities of gait and mobility (R26.89)     Time: 1124-1140 PT Time Calculation (min) (ACUTE ONLY): 16 min  Charges:  $Gait Training: 8-22 mins                     Zenaida Niece, PT, DPT Acute Rehabilitation Pager: [phone]    Zenaida Niece 05/09/2020, 12:37 PM

## 2020-05-10 ENCOUNTER — Other Ambulatory Visit: Payer: Self-pay | Admitting: Internal Medicine

## 2020-05-10 DIAGNOSIS — I10 Essential (primary) hypertension: Secondary | ICD-10-CM | POA: Diagnosis not present

## 2020-05-10 LAB — CBC
HCT: 39.5 % (ref 39.0–52.0)
Hemoglobin: 14.6 g/dL (ref 13.0–17.0)
MCH: 35.2 pg — ABNORMAL HIGH (ref 26.0–34.0)
MCHC: 37 g/dL — ABNORMAL HIGH (ref 30.0–36.0)
MCV: 95.2 fL (ref 80.0–100.0)
Platelets: 225 10*3/uL (ref 150–400)
RBC: 4.15 MIL/uL — ABNORMAL LOW (ref 4.22–5.81)
RDW: 12.9 % (ref 11.5–15.5)
WBC: 8.5 10*3/uL (ref 4.0–10.5)
nRBC: 0 % (ref 0.0–0.2)

## 2020-05-10 LAB — BASIC METABOLIC PANEL
Anion gap: 12 (ref 5–15)
BUN: 16 mg/dL (ref 8–23)
CO2: 21 mmol/L — ABNORMAL LOW (ref 22–32)
Calcium: 9.1 mg/dL (ref 8.9–10.3)
Chloride: 93 mmol/L — ABNORMAL LOW (ref 98–111)
Creatinine, Ser: 0.97 mg/dL (ref 0.61–1.24)
GFR, Estimated: >60 mL/min (ref >60)
Glucose, Bld: 86 mg/dL (ref 70–99)
Potassium: 3.7 mmol/L (ref 3.5–5.1)
Sodium: 126 mmol/L — ABNORMAL LOW (ref 135–145)

## 2020-05-10 LAB — MAGNESIUM: Magnesium: 2.1 mg/dL (ref 1.7–2.4)

## 2020-05-10 MED ORDER — SODIUM CHLORIDE 0.9 % IV SOLN: INTRAVENOUS | Status: DC

## 2020-05-10 NOTE — Progress Notes (Signed)
PT Cancellation Note  Patient Details Name: Jeffrey Acosta MRN: [id number] DOB: 1950/10/24   Cancelled Treatment:    Reason Eval/Treat Not Completed: Patient declined, no reason specified. Pt declines PT efforts twice today, on first attempt pt up and ambulating and walks back to bed, refusing to participate with PT. Pt then refuses, reporting he will not walk at this time. PT attempts to provide encouragement and education yet the pt remains resistant. PT will attempt to follow up as time allows.   Zenaida Niece 05/10/2020, 12:19 PM

## 2020-05-10 NOTE — TOC Progression Note (Signed)
Transition of Care [facility]) - Progression Note    Patient Details  Name: Jeffrey Acosta MRN: [id number] Date of Birth: [birth year]  Transition of Care [facility]) CM/SW [place], [place] Phone Number: 05/10/2020, 10:48 AM  Clinical Narrative:   Patient still with no bed offers for SNF. CSW faxed out referral again to pending SNF placements for them to review and respond. CSW to follow.    Expected Discharge Plan: Skilled Nursing Facility Barriers to Discharge: [facility] ([facility]),SNF Pending bed offer  Expected Discharge Plan and Services Expected Discharge Plan: [place]   Discharge Planning Services: CM Consult   Living arrangements for the past 2 months: Single Family Home Expected Discharge Date: 05/04/20               DME Arranged: 3-N-1,Walker rolling DME Agency: [employer] Date DME Agency Contacted: 05/04/20 Time DME Agency Contacted: [phone] Representative spoke with at DME Agency: [place] ([place]) Interventions    Readmission Risk Interventions No flowsheet data found.

## 2020-05-10 NOTE — Progress Notes (Signed)
PROGRESS NOTE    DRAVYN Acosta  IEP:[id number]  DOB: Dec 26, 1950  DOA: 04/30/2020 PCP: Colon Branch, MD Outpatient Specialists:   Hospital course:  Jeffrey Acosta is a 70 year old male with past medical history significant for EtOH abuse, bipolar affective disorder, diverticulosis, GERD, gout, migraine headache, hyperlipidemia, hypothyroidism, melanoma of skin, osteopenia and polyarthralgia who was admitted on 04/30/2020 with subarachnoid hemorrhage after collapsing at home with loss of consciousness. CT head notable for subarachnoid hemorrhage. Patient was admitted to [facility].  CTA head/neck negative for aneurysm or other vascular abnormality. Repeat CT head on 05/02/2020 with unchanged subarachnoid hemorrhage with minimal intraventricular reflux. Patient did develop cranial nerve palsy that resolved and none since initial episode. Neurosurgery was consulted and followed during hospital course.  MR brain 1/7 with scattered subarachnoid hemorrhage, decreased compared to CT dated 05/02/2020 with stable ventricular size without worsening hydrocephalus or ventriculomegaly, does note events cerebral atrophy for age.Repeat CT head 1/10 for worsening headache with continued improvement in subarachnoid hemorrhage and no new areas of hemorrhage or infarction identified.    Subjective:  Patient admits to feeling very tired.  Thinks he is constipated but is not sure.  Thinks he is eating well but is not sure.  His wife is at bedside who notes that he is not eating well at all.  She notes that he is about the same as he has been.   Objective: Vitals:   05/10/20 1050 05/10/20 1051 05/10/20 1110 05/10/20 1528  BP: (!) 143/92  (!) 157/88 (!) 157/87  Pulse:  84 76 66  Resp:   18 18  Temp:   98.1 F (36.7 C) 98.7 F (37.1 C)  TempSrc:   Axillary Oral  SpO2:   100% 98%  Weight:      Height:       No intake or output data in the 24 hours ending 05/10/20 1758 Filed Weights   05/08/20 0359 05/09/20  0458 05/10/20 0311  Weight: 92.3 kg 88.5 kg 88.3 kg     Exam:  General: Chronically ill-appearing man looking older than stated age, sleepy lying in bed easily arousable but tired appearing. Eyes: sclera anicteric, conjuctiva mild injection bilaterally CVS: S1-S2, regular  Respiratory:  decreased air entry bilaterally secondary to decreased inspiratory effort, rales at bases  GI: NABS, soft, NT  LE: No edema.  Neuro: A/O x 3, Moving all extremities equally with normal strength, CN 3-12 intact, grossly nonfocal.  Psych: patient is logical and coherent, judgement and insight appear normal, mood and affect appropriate to situation.   Assessment & Plan:   Aneurysmal (perimesencephalic) subarachnoid hemorrhage Repeat CT head to have revealed continued improvement in subarachnoid hemorrhage with no new areas of hemorrhage or infarction. Neurosurgery has no further recommendations. Plan is for follow-up with Dr. Ellene Route as an outpatient 2 weeks after discharge. 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 intake Will restart normal saline and follow-up on urine sodium and osmolality  HTN Toprol-XL was started at 50 mg daily Cannot titrate up further given borderline bradycardia Continue losartan 100 mg Continue amlodipine 10 mg  EtOH use disorder Continue CIWA protocol Continue MVI and  folate  Bipolar disorder Continue lamotrigine and Seroquel  Hypothyroidism Continue Synthroid  GERD/Barrett's Continue PPI    DVT prophylaxis: SCD, no anticoagulation given subarachnoid hemorrhage Code Status: Full Family  Communication: Patient's wife was at bedside throughout Disposition Plan:   Patient is from: Home  Anticipated Discharge Location: SNF  Barriers to Discharge: Awaiting SNF bed  Is patient medically stable for Discharge: Yes   Consultants:  PCCM  Neurosurgery  Procedures:  None  Antimicrobials:  None   Data Reviewed:  Basic Metabolic Panel: Recent Labs  Lab 05/04/20 0339 05/06/20 0446 05/07/20 0446 05/08/20 0345 05/09/20 0252 05/10/20 0411  NA 139 130* 129* 129* 128* 126*  K 3.9 3.8 3.9 3.4* 3.5 3.7  CL 103 98 94* 95* 94* 93*  CO2 24 22 24 23 23  21*  GLUCOSE 100* 88 108* 110* 101* 86  BUN 12 14 17 16 10 16   CREATININE 1.06 1.09 1.03 0.99 0.80 0.97  CALCIUM 8.9 8.7* 9.1 8.3* 8.6* 9.1  MG 1.6*  --  1.9 1.8  --  2.1  PHOS 3.1  --   --   --   --   --    Liver Function Tests: No results for input(s): AST, ALT, ALKPHOS, BILITOT, PROT, ALBUMIN in the last 168 hours. No results for input(s): LIPASE, AMYLASE in the last 168 hours. No results for input(s): AMMONIA in the last 168 hours. CBC: Recent Labs  Lab 05/04/20 0339 05/07/20 0446 05/10/20 0411  WBC 3.9* 7.8 8.5  HGB 12.2* 13.7 14.6  HCT 32.6* 36.6* 39.5  MCV 99.1 94.8 95.2  PLT 119* 147* 225   Cardiac Enzymes: No results for input(s): CKTOTAL, CKMB, CKMBINDEX, TROPONINI in the last 168 hours. BNP (last 3 results) No results for input(s): PROBNP in the last 8760 hours. CBG: No results for input(s): GLUCAP in the last 168 hours.  Recent Results (from the past 240 hour(s))  Resp Panel by RT-PCR (Flu A&B, Covid) Nasopharyngeal Swab     Status: None   Collection Time: 05/01/20 12:08 AM   Specimen: Nasopharyngeal Swab; Nasopharyngeal(NP) swabs in vial transport medium  Result Value Ref Range Status   SARS Coronavirus 2 by RT PCR NEGATIVE NEGATIVE Final    Comment: (NOTE) SARS-CoV-2 target nucleic acids are NOT DETECTED.  The SARS-CoV-2 RNA is generally detectable in upper respiratory specimens during  the acute phase of infection. The lowest concentration of SARS-CoV-2 viral copies this assay can detect is 138 copies/mL. A negative result does not preclude SARS-Cov-2 infection and should not be used as the sole basis for treatment or other patient management decisions. A negative result may occur with  improper specimen collection/handling, submission of specimen other than nasopharyngeal swab, presence of viral mutation(s) within the areas targeted by this assay, and inadequate number of viral copies(<138 copies/mL). A negative result must be combined with clinical observations, patient history, and epidemiological information. The expected result is Negative.  Fact Sheet for Patients:  EntrepreneurPulse.com.au  Fact Sheet for Healthcare Providers:  IncredibleEmployment.be  This test is no t yet approved or cleared by the [place] FDA and  has been authorized for detection and/or diagnosis of SARS-CoV-2 by FDA under an Emergency Use Authorization (EUA). This EUA will remain  in effect (meaning this test can be used) for the duration of the COVID-19 declaration under Section 564(b)(1) of the Act, 21 U.S.C.section 360bbb-3(b)(1), unless the authorization is terminated  or revoked sooner.       Influenza A by PCR NEGATIVE NEGATIVE Final  Influenza B by PCR NEGATIVE NEGATIVE Final    Comment: (NOTE) The Xpert Xpress SARS-CoV-2/FLU/RSV plus assay is intended as an aid in the diagnosis of influenza from Nasopharyngeal swab specimens and should not be used as a sole basis for treatment. Nasal washings and aspirates are unacceptable for Xpert Xpress SARS-CoV-2/FLU/RSV testing.  Fact Sheet for Patients: EntrepreneurPulse.com.au  Fact Sheet for Healthcare Providers: IncredibleEmployment.be  This test is not yet approved or cleared by the [place] FDA and has been authorized for detection and/or  diagnosis of SARS-CoV-2 by FDA under an Emergency Use Authorization (EUA). This EUA will remain in effect (meaning this test can be used) for the duration of the COVID-19 declaration under Section 564(b)(1) of the Act, 21 U.S.C. section 360bbb-3(b)(1), unless the authorization is terminated or revoked.  Performed at [facility], [place] [address], [place], [place] [address]   MRSA PCR Screening     Status: None   Collection Time: 05/02/20  3:32 AM   Specimen: Nasal Mucosa; Nasopharyngeal  Result Value Ref Range Status   MRSA by PCR NEGATIVE NEGATIVE Final    Comment:        The GeneXpert MRSA Assay (FDA approved for NASAL specimens only), is one component of a comprehensive MRSA colonization surveillance program. It is not intended to diagnose MRSA infection nor to guide or monitor treatment for MRSA infections. Performed at [facility], [place] [address], [place], [place] [address]       Studies: No results found.   Scheduled Meds: . amLODipine  10 mg Oral Daily  . atorvastatin  20 mg Oral QHS  . Chlorhexidine Gluconate Cloth  6 each Topical Daily  . fentaNYL  1 patch Transdermal Q72H  . folic acid  1 mg Oral Daily  . lamoTRIgine  25 mg Oral Daily  . levothyroxine  50 mcg Oral QAC breakfast  . losartan  100 mg Oral Daily  . metoprolol succinate  50 mg Oral Daily  . multivitamin with minerals  1 tablet Oral Daily  . pantoprazole  40 mg Oral Daily  . QUEtiapine  200 mg Oral QHS  . thiamine  100 mg Oral Daily   Continuous Infusions:  Principal Problem:   Subarachnoid hemorrhage (HCC) Active Problems:   Alcohol abuse   Bipolar I disorder ([place])   Hypothyroidism   Benign essential HTN     Anett Ranker Tublu Persephone Schriever, [facility]  If 7PM-7AM, please contact night-coverage [email or web address] Password [email or web address] 05/10/2020, 5:58 PM    LOS: 10 days

## 2020-05-10 NOTE — Progress Notes (Signed)
Occupational Therapy Treatment Patient Details Name: Jeffrey Acosta MRN: [id number] DOB: 04-30-1950 Today's Date: 05/10/2020    History of present illness 70 yo male presenting from home after a fall at home. 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 PMH includes: alcohol use, bipolar affective disorder,  hypothyroidism, atrial tachycardia.   OT comments  Pt continues to present with poor cognition, activity tolerance, and safety. Pt agreeable to grooming and mobility in hallway. Pt performing oral care with supervision-Min Guard. Pt demonstrating short sustained attention during oral care and unable to participate in any other conversation or tasks while prepping tooth brush. During conversation, pt's attention last ~5 sec before becoming internally distracted and needing attention cues. Pt performing short distance mobility in hallway with Min Guard and RW. Pt fatigues quickly and request to return to room.  Wife reporting she does not feel she can manage pt needs at home. Update dc recommendation to SNF and will continue to follow acutely as admitted.    Follow Up Recommendations  SNF;Supervision/Assistance - 24 hour    Equipment Recommendations  None recommended by OT    Recommendations for Other Services      Precautions / Restrictions Precautions Precautions: Fall;Other (comment) Precaution Comments: monitor BP       Mobility Bed Mobility Overal bed mobility: Needs Assistance Bed Mobility: Supine to Sit     Supine to sit: Supervision     General bed mobility comments: increased time  Transfers Overall transfer level: Needs assistance Equipment used: Rolling walker (2 wheeled) Transfers: Sit to/from Stand Sit to Stand: Min guard         General transfer comment: MIn Guard A for safety    Balance Overall balance assessment: Needs assistance Sitting-balance support: Feet supported;Single extremity supported;No  upper extremity supported Sitting balance-Leahy Scale: Fair     Standing balance support: Single extremity supported;Bilateral upper extremity supported Standing balance-Leahy Scale: Fair Standing balance comment: Able to sustain static standing without UE support                           ADL either performed or assessed with clinical judgement   ADL Overall ADL's : Needs assistance/impaired     Grooming: Oral care;Min guard;Supervision/safety;Standing Grooming Details (indicate cue type and reason): Supervision-Min Guard A for safety while standing at sink. Attempting to ask pt questions about dizziness in standing, but pt unable to answer questiosn and put toohpaste on toothbrush at same time                 Toilet Transfer: Min guard;Ambulation;RW (simulated to recliner) Toilet Transfer Details (indicate cue type and reason): Min Guard A for safety         Functional mobility during ADLs: Min guard;Rolling walker General ADL Comments: Pt continues to present with decreased cognition, activity tolerance, and safety.     Vision       Perception     Praxis      Cognition Arousal/Alertness: Awake/alert Behavior During Therapy: Flat affect Overall Cognitive Status: Impaired/Different from baseline Area of Impairment: Attention;Memory;Following commands;Safety/judgement;Awareness;Problem solving                   Current Attention Level: Focused;Sustained (Unable to sustain attention during conversation. Such as asking pt the year and pt stating "twenty and" then stared off out the window.) Memory: Decreased recall of precautions;Decreased short-term memory Following Commands: Follows one step commands  with increased time (requires multiple cues) Safety/Judgement: Decreased awareness of safety;Decreased awareness of deficits Awareness: Intellectual Problem Solving: Slow processing;Decreased initiation;Requires verbal cues;Requires tactile  cues;Difficulty sequencing General Comments: Pt continues to present with poor attention, awareness, and safety. Pt answering ~30% of questions and requiring increased time and simple direct cues.        Exercises     Shoulder Instructions       General Comments HR 90s. Wife present throughout    Pertinent Vitals/ Pain       Pain Assessment: Faces Faces Pain Scale: Hurts even more Pain Location: head Pain Descriptors / Indicators: Headache Pain Intervention(s): Monitored during session;Limited activity within patient's tolerance;Repositioned  Home Living                                          Prior Functioning/Environment              Frequency  Min 2X/week        Progress Toward Goals  OT Goals(current goals can now be found in the care plan section)  Progress towards OT goals: Progressing toward goals  Acute Rehab OT Goals Patient Stated Goal: to reduce headache OT Goal Formulation: With patient/family Time For Goal Achievement: 05/15/20 Potential to Achieve Goals: Good ADL Goals Additional ADL Goal #1: pt will complete a 5 step path finding task mod I Additional ADL Goal #2: pt will complete 2 dynamic balance task reaching more than 5 inches out of base of support mod i  Plan Discharge plan needs to be updated (Wife reporting she doesnt feel she can manage pt at home)    Co-evaluation                 AM-PAC OT "6 Clicks" Daily Activity     Outcome Measure   Help from another person eating meals?: None Help from another person taking care of personal grooming?: A Little Help from another person toileting, which includes using toliet, bedpan, or urinal?: A Little Help from another person bathing (including washing, rinsing, drying)?: A Little Help from another person to put on and taking off regular upper body clothing?: A Little Help from another person to put on and taking off regular lower body clothing?: A Little 6 Click  Score: 19    End of Session Equipment Utilized During Treatment: Gait belt;Rolling walker  OT Visit Diagnosis: Unsteadiness on feet (R26.81)   Activity Tolerance Patient tolerated treatment well;Patient limited by fatigue   Patient Left in chair;with call bell/phone within reach;with chair alarm set;with family/visitor present   Nurse Communication Mobility status        Time: 7782-4235 OT Time Calculation (min): 15 min  Charges: OT General Charges $OT Visit: 1 Visit OT Treatments $Self Care/Home Management : 8-22 mins  Teddi Badalamenti MSOT, OTR/L Acute Rehab Pager: [phone] Office: [place] 05/10/2020, 12:22 PM

## 2020-05-11 ENCOUNTER — Inpatient Hospital Stay (HOSPITAL_COMMUNITY): Payer: Medicare Other

## 2020-05-11 DIAGNOSIS — I428 Other cardiomyopathies: Secondary | ICD-10-CM

## 2020-05-11 DIAGNOSIS — I609 Nontraumatic subarachnoid hemorrhage, unspecified: Secondary | ICD-10-CM | POA: Diagnosis not present

## 2020-05-11 DIAGNOSIS — I351 Nonrheumatic aortic (valve) insufficiency: Secondary | ICD-10-CM | POA: Diagnosis not present

## 2020-05-11 LAB — POCT I-STAT EG7
Acid-base deficit: 5 mmol/L — ABNORMAL HIGH (ref 0.0–2.0)
Bicarbonate: 17.3 mmol/L — ABNORMAL LOW (ref 20.0–28.0)
Calcium, Ion: 1.2 mmol/L (ref 1.15–1.40)
HCT: 33 % — ABNORMAL LOW (ref 39.0–52.0)
Hemoglobin: 11.2 g/dL — ABNORMAL LOW (ref 13.0–17.0)
O2 Saturation: 93 %
Patient temperature: 36.8
Potassium: 3.6 mmol/L (ref 3.5–5.1)
Sodium: 128 mmol/L — ABNORMAL LOW (ref 135–145)
TCO2: 18 mmol/L — ABNORMAL LOW (ref 22–32)
pCO2, Ven: 25 mmHg — ABNORMAL LOW (ref 44.0–60.0)
pH, Ven: 7.449 — ABNORMAL HIGH (ref 7.250–7.430)
pO2, Ven: 62 mmHg — ABNORMAL HIGH (ref 32.0–45.0)

## 2020-05-11 LAB — COMPREHENSIVE METABOLIC PANEL
ALT: 25 U/L (ref 0–44)
AST: 35 U/L (ref 15–41)
Albumin: 3.9 g/dL (ref 3.5–5.0)
Alkaline Phosphatase: 67 U/L (ref 38–126)
Anion gap: 21 — ABNORMAL HIGH (ref 5–15)
BUN: 24 mg/dL — ABNORMAL HIGH (ref 8–23)
CO2: 15 mmol/L — ABNORMAL LOW (ref 22–32)
Calcium: 9.8 mg/dL (ref 8.9–10.3)
Chloride: 95 mmol/L — ABNORMAL LOW (ref 98–111)
Creatinine, Ser: 2.16 mg/dL — ABNORMAL HIGH (ref 0.61–1.24)
GFR, Estimated: 32 mL/min — ABNORMAL LOW (ref >60)
Glucose, Bld: 146 mg/dL — ABNORMAL HIGH (ref 70–99)
Potassium: 4.3 mmol/L (ref 3.5–5.1)
Sodium: 131 mmol/L — ABNORMAL LOW (ref 135–145)
Total Bilirubin: 2 mg/dL — ABNORMAL HIGH (ref 0.3–1.2)
Total Protein: 6.4 g/dL — ABNORMAL LOW (ref 6.5–8.1)

## 2020-05-11 LAB — TROPONIN I (HIGH SENSITIVITY)
Troponin I (High Sensitivity): 18 ng/L — ABNORMAL HIGH (ref <18)
Troponin I (High Sensitivity): 21 ng/L — ABNORMAL HIGH (ref <18)
Troponin I (High Sensitivity): 31 ng/L — ABNORMAL HIGH (ref <18)

## 2020-05-11 LAB — CBC WITH DIFFERENTIAL/PLATELET
Abs Immature Granulocytes: 0.12 10*3/uL — ABNORMAL HIGH (ref 0.00–0.07)
Basophils Absolute: 0 10*3/uL (ref 0.0–0.1)
Basophils Relative: 0 %
Eosinophils Absolute: 0 10*3/uL (ref 0.0–0.5)
Eosinophils Relative: 0 %
HCT: 39.8 % (ref 39.0–52.0)
Hemoglobin: 14.7 g/dL (ref 13.0–17.0)
Immature Granulocytes: 1 %
Lymphocytes Relative: 8 %
Lymphs Abs: 1 10*3/uL (ref 0.7–4.0)
MCH: 35.3 pg — ABNORMAL HIGH (ref 26.0–34.0)
MCHC: 36.9 g/dL — ABNORMAL HIGH (ref 30.0–36.0)
MCV: 95.4 fL (ref 80.0–100.0)
Monocytes Absolute: 0.7 10*3/uL (ref 0.1–1.0)
Monocytes Relative: 6 %
Neutro Abs: 10.8 10*3/uL — ABNORMAL HIGH (ref 1.7–7.7)
Neutrophils Relative %: 85 %
Platelets: 307 10*3/uL (ref 150–400)
RBC: 4.17 MIL/uL — ABNORMAL LOW (ref 4.22–5.81)
RDW: 12.9 % (ref 11.5–15.5)
WBC: 12.7 10*3/uL — ABNORMAL HIGH (ref 4.0–10.5)
nRBC: 0 % (ref 0.0–0.2)

## 2020-05-11 LAB — LACTIC ACID, PLASMA
Lactic Acid, Venous: >11 mmol/L (ref 0.5–1.9)
Lactic Acid, Venous: 3.2 mmol/L (ref 0.5–1.9)
Lactic Acid, Venous: 1.3 mmol/L (ref 0.5–1.9)

## 2020-05-11 LAB — ECHOCARDIOGRAM COMPLETE
Area-P 1/2: 2.62 cm2
Height: 73 in
P 1/2 time: 907 msec
S' Lateral: 2.4 cm
Weight: 2927.71 oz

## 2020-05-11 LAB — GLUCOSE, CAPILLARY: Glucose-Capillary: 143 mg/dL — ABNORMAL HIGH (ref 70–99)

## 2020-05-11 LAB — MAGNESIUM: Magnesium: 2.7 mg/dL — ABNORMAL HIGH (ref 1.7–2.4)

## 2020-05-11 LAB — CK: Total CK: 111 U/L (ref 49–397)

## 2020-05-11 MED ORDER — FOLIC ACID 1 MG PO TABS
1.0000 mg | ORAL_TABLET | Freq: Every day | ORAL | Status: DC
  Administered 2020-05-12: 1 mg
  Filled 2020-05-11: qty 1

## 2020-05-11 MED ORDER — THIAMINE HCL 100 MG/ML IJ SOLN
500.0000 mg | Freq: Three times a day (TID) | INTRAVENOUS | Status: AC
  Administered 2020-05-11 – 2020-05-13 (×9): 500 mg via INTRAVENOUS
  Filled 2020-05-11 (×9): qty 5

## 2020-05-11 MED ORDER — PIPERACILLIN-TAZOBACTAM 3.375 G IVPB 30 MIN
3.3750 g | Freq: Three times a day (TID) | INTRAVENOUS | Status: DC
  Administered 2020-05-11 – 2020-05-12 (×2): 3.375 g via INTRAVENOUS
  Filled 2020-05-11 (×5): qty 50

## 2020-05-11 MED ORDER — SODIUM CHLORIDE 0.9 % IV BOLUS
1000.0000 mL | Freq: Once | INTRAVENOUS | Status: AC
  Administered 2020-05-11: 1000 mL via INTRAVENOUS

## 2020-05-11 MED ORDER — NIMODIPINE 6 MG/ML PO SOLN
60.0000 mg | ORAL | Status: DC
  Administered 2020-05-11 – 2020-05-13 (×8): 60 mg
  Filled 2020-05-11 (×6): qty 10

## 2020-05-11 MED ORDER — LEVOTHYROXINE SODIUM 50 MCG PO TABS
50.0000 ug | ORAL_TABLET | Freq: Every day | ORAL | Status: DC
  Filled 2020-05-11: qty 1

## 2020-05-11 MED ORDER — ATORVASTATIN CALCIUM 10 MG PO TABS
20.0000 mg | ORAL_TABLET | Freq: Every day | ORAL | Status: DC
  Administered 2020-05-11: 20 mg

## 2020-05-11 MED ORDER — VANCOMYCIN HCL IN DEXTROSE 1-5 GM/200ML-% IV SOLN
1000.0000 mg | INTRAVENOUS | Status: DC
  Administered 2020-05-11: 1000 mg via INTRAVENOUS
  Filled 2020-05-11: qty 200

## 2020-05-11 MED ORDER — THIAMINE HCL 100 MG/ML IJ SOLN
250.0000 mg | Freq: Every day | INTRAVENOUS | Status: DC
  Administered 2020-05-15 – 2020-05-16 (×2): 250 mg via INTRAVENOUS
  Filled 2020-05-11 (×3): qty 2.5

## 2020-05-11 MED ORDER — NIMODIPINE 6 MG/ML PO SOLN
60.0000 mg | ORAL | Status: DC
  Filled 2020-05-11: qty 10

## 2020-05-11 MED ORDER — POLYETHYLENE GLYCOL 3350 17 G PO PACK: 17.0000 g | PACK | Freq: Every day | ORAL | Status: DC | PRN

## 2020-05-11 MED ORDER — VANCOMYCIN HCL IN DEXTROSE 1-5 GM/200ML-% IV SOLN
1000.0000 mg | INTRAVENOUS | Status: DC
  Administered 2020-05-12: 1000 mg via INTRAVENOUS
  Filled 2020-05-11: qty 200

## 2020-05-11 MED ORDER — DOCUSATE SODIUM 50 MG/5ML PO LIQD: 100.0000 mg | Freq: Two times a day (BID) | ORAL | Status: DC | PRN

## 2020-05-11 MED ORDER — HYDRALAZINE HCL 25 MG PO TABS: 25.0000 mg | ORAL_TABLET | Freq: Four times a day (QID) | ORAL | Status: DC | PRN

## 2020-05-11 MED ORDER — SODIUM CHLORIDE 0.9 % IV SOLN
INTRAVENOUS | Status: DC | PRN
  Administered 2020-05-11: 500 mL via INTRAVENOUS

## 2020-05-11 MED ORDER — PANTOPRAZOLE SODIUM 40 MG PO PACK
40.0000 mg | PACK | Freq: Every day | ORAL | Status: DC
  Administered 2020-05-12: 40 mg
  Filled 2020-05-11: qty 20

## 2020-05-11 MED ORDER — ADULT MULTIVITAMIN W/MINERALS CH
1.0000 | ORAL_TABLET | Freq: Every day | ORAL | Status: DC
  Administered 2020-05-12: 1
  Filled 2020-05-11: qty 1

## 2020-05-11 MED ORDER — PIPERACILLIN-TAZOBACTAM 3.375 G IVPB 30 MIN
3.3750 g | Freq: Three times a day (TID) | INTRAVENOUS | Status: DC
  Administered 2020-05-11: 3.375 g via INTRAVENOUS
  Filled 2020-05-11 (×3): qty 50

## 2020-05-11 MED ORDER — ACETAMINOPHEN 325 MG PO TABS: 650.0000 mg | ORAL_TABLET | Freq: Four times a day (QID) | ORAL | Status: DC | PRN

## 2020-05-11 NOTE — Progress Notes (Signed)
Same day progress note  -EEG with generalized slowing  -MRI brain without contrast with small acute infarcts in the left basal ganglia, left periatrial temporal lobe and high bilateral frontoparietal region with no substantial associated edema or mass-effect.  The ventricular system is slightly increased in size compared to the prior MRI with mild convexity of the third ventricle and rounding of the temporal horns concerning for developing hydrocephalus.  When compared to the prior MRI, there is decreased small volume acute/subacute parafalcine subdural hematoma as well as scattered subarachnoid hemorrhage.  Similar bilateral subdural hygromas.   Updated assessment: Likely delayed cerebral ischemia as a sequel of the perimesencephalic subarachnoid hemorrhage.  Vasospasm as a cause should be evaluated.  An echocardiogram has been completed with LVEF of 55 to 60%, mild left atrial dilatation.  Updated recommendations in addition to prior:: - No need to repeat echo - CTA head and neck would not be possible given his creatinine of 2.16 and his estimated GFR of 32. - I would recommend starting him back on nimodipine - MRA head without contrast -Check A1c and lipid panel -No antiplatelet or anticoagulant -Blood pressure goal- avoid hypotension-preferably keep blood pressures above 614 systolic which he has, hydration so that there is hypervolemia and hemodilution - I have reached out to neurosurgery to see if they have any other recommendations. - Stroke team will continue to follow with you.  -- Amie Portland, MD Neurologist [facility] Pager: [phone]  Additional 15 minutes of critical care time.

## 2020-05-11 NOTE — Progress Notes (Signed)
NAME:  Jeffrey Acosta, MRN:  [id number], DOB:  [birth year], LOS: 62 ADMISSION DATE:  04/30/2020, CONSULTATION DATE: 11 May 2020 REFERRING MD: Dr. Olevia Bowens, CHIEF COMPLAINT: Severe hypothermia  Brief History:  70 year old male that presented with subarachnoid hemorrhage while admitted to the hospital patient wandered off on the medical floor and was found hypothermic outside.  History of Present Illness:  This is a 70 year old white male that presented to the hospital on 02 May 2019 for subarachnoid hemorrhage.  He was initially evaluated in the intensive care and then transferred to the medical floor.  The patient remained confused but able to ambulate.  Today the patient wandered off the medical floor and was found outside in subfreezing temperatures.  Patient was last known on the medical floor approximately 2 hours previous.  At the time staff found him he appeared to be in a catatonic-like state.  On arrival back to the medical floor the staff was unable to obtain a rectal temperature.  Active rewarming was started.  First temperature that was obtained was 89.6 rectally.  Past Medical History:  EtOH abuse Subarachnoid hemorrhage Bipolar affective disorder Diverticulosis GERD Gout Hiatal hernia Hyperlipidemia Hypothermia Osteopenia Polyarthralgia  Significant Hospital Events:  Patient wandered off medical floor was found outside hypothermic   Micro Data:  COVID: Negative Influenza: Negative  Antimicrobials:  None    Objective   Blood pressure (!) 161/140, pulse (!) 110, temperature (!) 86.4 F (30.2 C), temperature source Rectal, resp. rate 18, height 6\' 1"  (1.854 m), weight 88.3 kg, SpO2 (!) 59 %.       No intake or output data in the 24 hours ending 05/11/20 0459 Filed Weights   05/08/20 0359 05/09/20 0458 05/10/20 0311  Weight: 92.3 kg 88.5 kg 88.3 kg    Examination: General: No acute distress HENT: Atraumatic/normocephalic mucous membranes are moist Lungs:  Clear to auscultation bilaterally no wheezing rales or rhonchi noted Cardiovascular: Regular rate Abdomen: Soft, nontender, nondistended, no rebound/rigidity/guarding.  Positive bowel sounds Extremities: Cool to touch.  Acral cyanosis.  Distal pulses intact x4.  No edema.  Right elbow abrasion right knee abrasion Neuro: Awake and alert.  Slowly follows commands with fingers and toes.  Pupils are equal and reactive GU: Condom cath   Assessment & Plan:  Severe hypothermia secondary to environmental exposure Subarachnoid hemorrhage EtOH abuse Bipolar affective disorder Hypothyroidism  Plan: Patient's neurologic status continues to improve as his temperature improves.  He now states that he tried to go outside to have a cigarette he denies any new physical complaints. Continue current active rewarming Obtain CT head Plain film of the right arm and right leg Continue p.o. medications once patient's neurologic status is at the point where he is able to take it. Hold neurological altering medications at this time. Monitor I/os. Neurochecks every hour  Best practice (evaluated daily)  Diet: N.p.o. until neurologic status significantly improves. Pain/Anxiety/Delirium protocol (if indicated): n/a VAP protocol (if indicated): n/a DVT prophylaxis: SCD only GI prophylaxis: Protonix Glucose control: Continue monitor blood glucose levels Mobility: Bedrest Disposition: Transfer to ICU  Goals of Care:  Code Status: Full  Labs   CBC: Recent Labs  Lab 05/07/20 0446 05/10/20 0411  WBC 7.8 8.5  HGB 13.7 14.6  HCT 36.6* 39.5  MCV 94.8 95.2  PLT 147* 409    Basic Metabolic Panel: Recent Labs  Lab 05/06/20 0446 05/07/20 0446 05/08/20 0345 05/09/20 0252 05/10/20 0411  NA 130* 129* 129* 128* 126*  K 3.8 3.9 3.4*  3.5 3.7  CL 98 94* 95* 94* 93*  CO2 22 24 23 23  21*  GLUCOSE 88 108* 110* 101* 86  BUN 14 17 16 10 16   CREATININE 1.09 1.03 0.99 0.80 0.97  CALCIUM 8.7* 9.1 8.3* 8.6*  9.1  MG  --  1.9 1.8  --  2.1   GFR: Estimated Creatinine Clearance: 78.9 mL/min (by C-G formula based on SCr of 0.97 mg/dL). Recent Labs  Lab 05/07/20 0446 05/10/20 0411  WBC 7.8 8.5    Liver Function Tests: No results for input(s): AST, ALT, ALKPHOS, BILITOT, PROT, ALBUMIN in the last 168 hours. No results for input(s): LIPASE, AMYLASE in the last 168 hours. No results for input(s): AMMONIA in the last 168 hours.  ABG    Component Value Date/Time   TCO2 20 (L) 05/01/2020 0026     Coagulation Profile: No results for input(s): INR, PROTIME in the last 168 hours.  Cardiac Enzymes: No results for input(s): CKTOTAL, CKMB, CKMBINDEX, TROPONINI in the last 168 hours.  HbA1C: Hgb A1c MFr Bld  Date/Time Value Ref Range Status  01/21/2017 09:13 AM 4.3 (L) 4.6 - 6.5 % Final    Comment:    Glycemic Control Guidelines for People with Diabetes:Non Diabetic:  <6%Goal of Therapy: <7%Additional Action Suggested:  >8%   09/11/2014 03:05 PM 4.7 4.6 - 6.5 % Final    Comment:    Glycemic Control Guidelines for People with Diabetes:Non Diabetic:  <6%Goal of Therapy: <7%Additional Action Suggested:  >8%     CBG: Recent Labs  Lab 05/11/20 0435  GLUCAP 143*    Review of Systems:   Unable to obtain accurate review of systems secondary to neurologic status  Past Medical History:  He,  has a past medical history of Alcoholism in remission ([place]), Arrhythmia, Bipolar affective disorder ([place]), Diverticulosis of colon (without mention of hemorrhage), GERD (gastroesophageal reflux disease), Gout of multiple sites, Hiatal hernia, migraines, Hydrocele, HYPERLIPIDEMIA (01/10/2009), Hypothyroidism, Melanoma of skin, site unspecified (11/25/2012), Osteopenia, and Polyarthralgia.   Surgical History:   Past Surgical History:  Procedure Laterality Date  . BILIARY BRUSHING  12/07/2018   Procedure: BILIARY BRUSHING;  Surgeon: Rush Landmark Telford Nab., MD;  Location: [facility];  Service:  Gastroenterology;;  . BILIARY DILATION  12/07/2018   Procedure: BILIARY DILATION;  Surgeon: Irving Copas., MD;  Location: [facility];  Service: Gastroenterology;;  . BIOPSY  12/07/2018   Procedure: BIOPSY;  Surgeon: Irving Copas., MD;  Location: [facility];  Service: Gastroenterology;;  . COLONOSCOPY  2003   negative ; Dr Sharlett Iles  . ERCP N/A 12/07/2018   Procedure: ENDOSCOPIC RETROGRADE CHOLANGIOPANCREATOGRAPHY (ERCP);  Surgeon: Irving Copas., MD;  Location: [facility];  Service: Gastroenterology;  Laterality: N/A;  . ESOPHAGOGASTRODUODENOSCOPY (EGD) WITH PROPOFOL N/A 12/07/2018   Procedure: ESOPHAGOGASTRODUODENOSCOPY (EGD) WITH PROPOFOL;  Surgeon: Rush Landmark Telford Nab., MD;  Location: [facility];  Service: Gastroenterology;  Laterality: N/A;  . EUS N/A 12/07/2018   Procedure: UPPER ENDOSCOPIC ULTRASOUND (EUS) RADIAL;  Surgeon: Rush Landmark Telford Nab., MD;  Location: [facility];  Service: Gastroenterology;  Laterality: N/A;  . INGUINAL HERNIA REPAIR  08/07/2011   Procedure: HERNIA REPAIR INGUINAL ADULT;  Surgeon: Rolm Bookbinder, MD;  Location: [facility];  Service: General;  Laterality: Right;  . MELANOMA EXCISION  07/28/11   Dr Sarajane Jews; stomach and chest  . REMOVAL OF STONES  12/07/2018   Procedure: REMOVAL OF STONES;  Surgeon: Rush Landmark Telford Nab., MD;  Location: [facility];  Service: Gastroenterology;;  . [name]  1951  . 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 UPPER GASTROINTESTINAL ENDOSCOPY  1983   hiatal hernia  . VASECTOMY  1979     Social History:   reports that he has never smoked. He has never used smokeless tobacco. He reports previous alcohol use. He reports that he does not use drugs.   Family History:  His family history includes COPD in his father and mother; Diabetes in his paternal grandmother; Heart attack (age of onset: 69) in his father; Lung cancer in his paternal grandfather; Parkinsonism  in his paternal grandmother; Stroke (age of onset: 38) in his paternal grandmother; Stroke (age of onset: 67) in his maternal grandfather. There is no history of Colon cancer or Prostate cancer.   Allergies Allergies  Allergen Reactions  . Omnipaque [Iohexol] Hives     Desc: developed hives after injection; resolved with 50 mg benadryl given to pt.      Home Medications  Prior to Admission medications   Medication Sig Start Date End Date Taking? Authorizing Provider  allopurinol (ZYLOPRIM) 300 MG tablet Take 1 tablet (300 mg total) by mouth daily. 08/01/19  Yes Colon Branch, MD  aspirin 81 MG tablet Take 81 mg by mouth daily.   Yes [provider]  b complex vitamins capsule Take 1 capsule by mouth daily.   Yes [provider]  Cholecalciferol (VITAMIN D) 50 MCG (2000 UT) tablet Take 2,000 Units by mouth daily.   Yes [provider]  esomeprazole (NEXIUM) 40 MG capsule Take 1 capsule (40 mg total) by mouth daily before breakfast. 01/11/20  Yes Paz, Alda Berthold, MD  indomethacin (INDOCIN) 25 MG capsule Take 25 mg by mouth every 12 (twelve) hours as needed (back pain).  06/10/16  Yes [provider]  lamoTRIgine (LAMICTAL) 25 MG tablet Take 25 mg by mouth daily. 12/30/19  Yes [provider]  levothyroxine (SYNTHROID) 50 MCG tablet Take 1 tablet (50 mcg total) by mouth daily before breakfast. 01/11/20  Yes Paz, Alda Berthold, MD  metoprolol succinate (TOPROL-XL) 100 MG 24 hr tablet Take 100 mg by mouth daily. Take with or immediately following a meal.   Yes [provider]  Multiple Vitamin (MULTIVITAMIN WITH MINERALS) TABS tablet Take 1 tablet by mouth daily.   Yes [provider]  naltrexone (DEPADE) 50 MG tablet Take 50 mg by mouth daily.  01/15/16  Yes [provider]  QUEtiapine (SEROQUEL) 400 MG tablet Take 800 mg by mouth at bedtime.    Yes [provider]  tamsulosin (FLOMAX) 0.4 MG CAPS capsule Take 1 capsule (0.4 mg total)  by mouth daily after supper. 10/12/16  Yes Paz, Alda Berthold, MD  timolol (TIMOPTIC) 0.5 % ophthalmic solution 1 drop 2 (two) times daily. 03/17/19  Yes [provider]  vitamin B-12 (CYANOCOBALAMIN) 500 MCG tablet Take 500 mcg by mouth daily.   Yes [provider]  acetaminophen-codeine (TYLENOL #3) 300-30 MG tablet Take 1 tablet by mouth every 6 (six) hours as needed for moderate pain. 05/04/20   [place] Eric J, DO  atorvastatin (LIPITOR) 20 MG tablet Take 1 tablet (20 mg total) by mouth at bedtime. 05/10/20   Colon Branch, MD  levETIRAcetam (KEPPRA) 1000 MG tablet Take 1 tablet (1,000 mg total) by mouth 2 (two) times daily for 6 days. 05/04/20 05/10/20  [place] Eric J, DO     Critical care time: 45 minutes

## 2020-05-11 NOTE — Progress Notes (Signed)
[facility] Progress Note Patient Name: Jeffrey Acosta DOB: [birth year] MRN: [id number]   Date of Service  05/11/2020  HPI/Events of Note  Patient wandered away from his room on the med-surg floor and was found outside the hospital building , unresponsive and hypothermic. Patient was transferred to the ICU.  eICU Interventions  New Patient Evaluation completed.        Kerry Kass Ogan 05/11/2020, 5:52 AM

## 2020-05-11 NOTE — Progress Notes (Signed)
MRA Head attempted, pt uncooperative and unable to hold still.

## 2020-05-11 NOTE — Procedures (Addendum)
Patient Name: Jeffrey Acosta  MRN: [id number]  Epilepsy Attending: Lora Havens  Referring Physician/Provider: Dr Margaretha Seeds Date: 1/05/01/2020 Duration: 26.46 mins  Patient history: 70 year old male initially admitted to the hospital 04/30/2020 following a subarachnoid hemorrhage. His history is significant for ETOH abuse, hyperlipidemia, GERD, and bipolar disorder. Early morning 05/11/2020, Mr. Morado wandered away from his room and was found outside of the hospital unresponsive and hypothermic concerning for ischemic stroke secondary to vasospasm versus seizure. EEG to evaluate for seizure  Level of alertness:  lethargic  AEDs during EEG study: None  Technical aspects: This EEG study was done with scalp electrodes positioned according to the 10-20 International system of electrode placement. Electrical activity was acquired at a sampling rate of 500Hz  and reviewed with a high frequency filter of 70Hz  and a low frequency filter of 1Hz . EEG data were recorded continuously and digitally stored.   Description: EEG showed continuous generalized 3 to 6 Hz theta-delta slowing.  Hyperventilation and photic stimulation were not performed.     ABNORMALITY -Continuous slow, generalized  IMPRESSION: This study is suggestive of moderate diffuse encephalopathy, nonspecific etiology. No seizures or epileptiform discharges were seen throughout the recording.  Dailyn Reith Barbra Sarks

## 2020-05-11 NOTE — Significant Event (Signed)
Rapid Response Event Note   Reason for Call :  Hypothermia  Initial Focused Assessment:  Pt lying in bed, minimally responsive. He will moan/move all extremeties to painful stimuli. Lungs diminished t/o. Skin cold to touch. R elbow and R knee abrasion noted.  Warm blankets and warm packets already on pt.   T-89.4(R), HR-86, SBP-160s, RR-18, SpO2-unable to obtain d/t hypothermia. Pt placed on 4L [place].  Bairhugger placed on pt, new PIV inserted, labs drawn, and pt transported to CT/Xray and then to 4N27.   Interventions:  EKG-unable to obtain CBG-143 Bairhugger New PIV R hand/elbow xray R knee xray PCCM consulted: CBCD,CMP,Trop, LA CT head Tx to [place] of Care:  Tx to 4N27   Event Summary:   MD Notified: Dr. Olevia Bowens notified PTA RRT and PCCM MD Earlie Server consulted and came to bedside  Call [place], Tiffanyann Deroo Anderson, RN

## 2020-05-11 NOTE — Progress Notes (Signed)
NAME:  Jeffrey Acosta, MRN:  [id number], DOB:  10/20/1950, LOS: 16 ADMISSION DATE:  04/30/2020, CONSULTATION DATE:  1/4 REFERRING MD:  Dr. Billy Fischer, CHIEF COMPLAINT:  SAH   Brief History:  70 year old male admitted 1/4 with SAH after collapsing at home with loss of consciousness.  Found to have subarachnoid hemorrhage, CT angios negative for aneurysm or AV malformation.  Likely perimesencephalic SAH  Past Medical History:   has a past medical history of Alcoholism in remission ([place]), Arrhythmia, Bipolar affective disorder ([place]), Diverticulosis of colon (without mention of hemorrhage), GERD (gastroesophageal reflux disease), Gout of multiple sites, Hiatal hernia, migraines, Hydrocele, HYPERLIPIDEMIA (01/10/2009), Hypothyroidism, Melanoma of skin, site unspecified (11/25/2012), Osteopenia, and Polyarthralgia.   Significant Hospital Events:  1/4 admit 1/6 complained of inability to open left eye, pupil was dilated and minimally reactive associated with left III n palsy.  Stat head CT was done which showed no new changes  Consults:  Neurosurgery  Procedures:    Significant Diagnostic Tests:  CT head 1/4 > Moderate to large amount of acute subarachnoid /extra-axial hemorrhage, most heavily concentrated at the suprasellar cistern, and anterior to the brainstem. Findings felt consistent with ruptured aneurysm. Additional foci of extra-axial blood within the posterior fossa, left greater than right middle cranial fossa, and anterior inter hemispheric region. Small amount of bilateral intraventricular and fourth ventricular hemorrhage. Suspected left anterior subdural hygroma as well.  CT C-spine 1/4 > multilevel cervical spondylosis. No acute fracture.  CTA head/neck 1/4 > negative for aneurysm or other vascular abnormality.  No LVO.  Age indeterminate compression deformities at the superior endplates of C7, T3, T4.  CT head 1/6: Unchanged subarachnoid hemorrhage. Minimal intraventricular  reflux. The ventricles measure a few millimeters larger than yesterday, but no ventriculomegaly  Micro Data:  COVID 1/4 > neg. Flu 1/4 > neg. MRSA PCR 1/6 > negative  Antimicrobials:     Interim History / Subjective:   Readmitted to ICU for hypothermia  Objective   Blood pressure (!) 98/59, pulse 63, temperature (!) 90.7 F (32.6 C), temperature source Rectal, resp. rate 15, height 6\' 1"  (1.854 m), weight 83 kg, SpO2 100 %.        Intake/Output Summary (Last 24 hours) at 05/11/2020 0747 Last data filed at 05/11/2020 0700 Gross per 24 hour  Intake 803.75 ml  Output --  Net 803.75 ml   Filed Weights   05/09/20 0458 05/10/20 0311 05/11/20 0500  Weight: 88.5 kg 88.3 kg 83 kg   Physical Exam: General: Chronically ill-appearing, no acute distress HENT: , AT, OP clear, MMM Eyes: EOMI, no scleral icterus Respiratory: Clear to auscultation bilaterally.  No crackles, wheezing or rales Cardiovascular: RRR, -M/R/G, no JVD GI: BS+, soft, nontender Extremities:-Edema,-tenderness Neuro: Lethargic, opens eyes to voice, moves extremities x 4 spontaneously  GU: Condom cath in place   CBC Latest Ref Rng & Units 05/11/2020 05/10/2020 05/07/2020  WBC 4.0 - 10.5 K/uL 12.7(H) 8.5 7.8  Hemoglobin 13.0 - 17.0 g/dL 14.7 14.6 13.7  Hematocrit 39.0 - 52.0 % 39.8 39.5 36.6(L)  Platelets 150 - 400 K/uL 307 225 147(L)   BMP Latest Ref Rng & Units 05/11/2020 05/10/2020 05/09/2020  Glucose 70 - 99 mg/dL 146(H) 86 101(H)  BUN 8 - 23 mg/dL 24(H) 16 10  Creatinine 0.61 - 1.24 mg/dL 2.16(H) 0.97 0.80  Sodium 135 - 145 mmol/L 131(L) 126(L) 128(L)  Potassium 3.5 - 5.1 mmol/L 4.3 3.7 3.5  Chloride 98 - 111 mmol/L 95(L) 93(L) 94(L)  CO2 22 - 32 mmol/L 15(L) 21(L) 23  Calcium 8.9 - 10.3 mg/dL 9.8 9.1 8.6(L)   CT head 05/12/19 - Regression of bilateral SAH. Resolved IVH  Imaging, labs and test noted above have been reviewed independently by me.  Assessment & Plan:   Moderate Hypothermia - Found down  with T 89.4, no shivering present, decreased alertness and mild bradycardia --Rewarming with blanket and IVF --Insert foley with temp monitor --ABG for acidosis --Continue maintenance IVF  Altered mental status secondary to hypothermia - SAH improving. Low suspicion for seizure how cannot rule out. Concern for stroke though exam reportedly non-focal ?alcohol withdrawal --STAT EEG, MRI --Neuro consulted --DC sedating meds including fentanyl patch  Sepsis secondary to unknown source - low suspicion for infection however will de-escalate antibiotics pending culture and lab data --Continue broad spectrum antibiotics --Trend lactic acid --IVF resuscitation --Low threshold for vasopressor support  AKI --Monitor UOP/Cr --Place foley  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) -  1/6 developed transient neurological finding with left 3rd nerve palsy, now resolved --No role nimodipine as non-aneurysmal SAH Neurosurgery following Tylenol 3 as needed headache  Alcohol abuse: Currently no signs of withdrawal --Continue thiamine, folate --CIWA protocol with ativan  Chronic thrombocytopenia No signs of active bleeding  Bipolar disorder Hold Lamictal and Seroquel for now resume at discharge  Hypothyroidism Continue home sythroid  Hyperlipidemia Continue atorvastatin  GERD/ Barrett's esophagus  Continue Protonix    Best practice (evaluated daily)  Diet: Heart healthy Pain/Anxiety/Delirium protocol (if indicated): Morphine PRN, CIWA ativan VAP protocol (if indicated): NA DVT prophylaxis: SCD GI prophylaxis: PPI Glucose control: NA Mobility: As tolerated/PT/OT evaluation Disposition: Remain in ICU Communication: Updated sister at bedside on 1/15  Goals of Care:  Last date of multidisciplinary goals of care discussion: 1/6 Family and staff present: Patient and his RN Summary of discussion: Continue aggressive care Follow up goals of care discussion due:  1/13 Code Status: Full code  The patient is critically ill with multiple organ systems failure and requires high complexity decision making for assessment and support, frequent evaluation and titration of therapies, application of advanced monitoring technologies and extensive interpretation of multiple databases.  Independent Critical Care Time: 35 Minutes.   Rodman Pickle, M.D. [facility] Pulmonary/Critical Care Medicine 05/11/2020 7:47 AM   Please see Amion for pager number to reach on-call Pulmonary and Critical Care Team.

## 2020-05-11 NOTE — Progress Notes (Signed)
  Echocardiogram 2D Echocardiogram has been performed.  Randa Lynn Bryston Colocho 05/11/2020, 8:53 AM

## 2020-05-11 NOTE — Progress Notes (Signed)
At about 0117, the nurse went to the patient room to make a round and check on the patient. The patient was nowhere to be found. The patient was out of the floor through the back door.The back door alarm was disabled. The security was immediately notified, and everybody including the nurses and the technicians started looking for him. The patient was found unresponsive on the ground floor near the morgue. He was brought back to the floor. He was hypothermic with bruises on the right elbow and right leg. His mentation was altered. MD, rapid responses, and the family were notified. He was rewarmed and transferred to the 4North floor room 27 for urgent care.

## 2020-05-11 NOTE — Progress Notes (Signed)
TR[facility] night shift.  The patient was missing from the unit for about 2 hours, was found outside of the hospital hypothermic and altered. Apparently he went out to smoke a cigarette. We were unable to obtain a temperature measurement at first, but after the patient was warmed up his temperature rose to 89.6F. His mentation has improved since he was brought back inside and warming measures have been taken. He sustained abrasions and contusions on his right elbow, hand and knee. Imaging for this area has been ordered. PCCM was consulted and he was susceptive to the ICU for further treatment. Please see Dr. Rhys Martini note for further detail.  Tennis Must, MD.

## 2020-05-11 NOTE — Consult Note (Addendum)
Neurology Consultation  Reason for Consult: Found unresponsive and hypothermic outside of the hospital after wandering away from his inpatient room. Referring Physician: Dr. Earlie Server  CC: AMS, found unresponsive, hypothermia  History is obtained from: chart review, family at bedside  HPI: Jeffrey Acosta is a 70 y.o. male with a medical history significant for recent [facility] 04/30/2020, ETOH and tobacco abuse, bipolar disorder, hypothyroidism, diverticulosis, GERD, hyperlipidemia, osteopenia, and polyarthralgia.  04/30/2020:   - Initial admission following sudden collapse with fall at home.   - CT: moderate to large amount of acute SAH, felt consistent with ruptured aneurysm with a small amount of bilateral IVH and slight increase in size of lateral and third ventricles. 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 05/11/2020:   - Patient wandered off away from inpatient room for approximately 2 hours. Per chart review, patient later reported that he had gone to smoke a cigarette. Was found down outside of the hospital unresponsive and hypothermic. Transferred to ICU at this time.   - CT head: 1. Further regression of bilateral extra-axial hemorrhage since 05/06/2020, only small volume residual. Superimposed chronic bilateral subdural hygromas remain stable. Resolved intraventricular blood. 2. No significant intracranial mass effect. No new intracranial abnormality.  - Per family at bedside, yesterday 05/10/2020 Mr. Scheffel's mental status was concerning for aphasia and inattention to conversation, decrease in responsiveness.   ROS: Unable to obtain due to altered mental status.   Past Medical History:  Diagnosis Date  . Alcoholism in remission ([place])    in [place]  . Arrhythmia    atrial tachycardia  . Bipolar affective disorder ([place])   . Diverticulosis of colon (without mention of hemorrhage)   . GERD (gastroesophageal reflux disease)   . Gout of multiple sites   . Hiatal hernia   . Hx of  migraines   . Hydrocele   . HYPERLIPIDEMIA 01/10/2009   Qualifier: Diagnosis of  By: Ronnald Ramp CMA, [place]    NMR Lipoprofile 2011 LDL could not be calculated  Due to TG of 889  (811  / 750 ),  HDL 50 . Father MI @ 17 PGM CVA early 84s; PGF CVA @72    . Hypothyroidism   . Melanoma of skin, site unspecified 11/25/2012   07/2011 abdominal  Stage 1 Dr Amy [place] Dr Sarajane Jews   . Osteopenia   . Polyarthralgia    Family History  Problem Relation Age of Onset  . COPD Father   . Heart attack Father 4  . COPD Mother   . Diabetes Paternal Grandmother   . Parkinsonism Paternal Grandmother   . Stroke Paternal Grandmother 75  . Stroke Maternal Grandfather 72  . Lung cancer Paternal Grandfather        Heavy smoker  . Colon cancer Neg Hx   . Prostate cancer Neg Hx    Social History:   reports that he has never smoked. He has never used smokeless tobacco. He reports previous alcohol use. He reports that he does not use drugs.  Medications Current Outpatient Medications  Medication Instructions  . acetaminophen-codeine (TYLENOL #3) 300-30 MG tablet 1 tablet, Oral, Every 6 hours PRN  . allopurinol (ZYLOPRIM) 300 mg, Oral, Daily  . aspirin 81 mg, Oral, Daily  . atorvastatin (LIPITOR) 20 mg, Oral, Daily at bedtime  . b complex vitamins capsule 1 capsule, Oral, Daily  . esomeprazole (NEXIUM) 40 mg, Oral, Daily before breakfast  . indomethacin (INDOCIN) 25 mg, Oral, Every 12 hours PRN  . lamoTRIgine (LAMICTAL) 25 mg, Oral, Daily  .  levETIRAcetam (KEPPRA) 1,000 mg, Oral, 2 times daily  . levothyroxine (SYNTHROID) 50 mcg, Oral, Daily before breakfast  . metoprolol succinate (TOPROL-XL) 100 mg, Oral, Daily, Take with or immediately following a meal.  . Multiple Vitamin (MULTIVITAMIN WITH MINERALS) TABS tablet 1 tablet, Oral, Daily  . naltrexone (DEPADE) 50 mg, Oral, Daily  . QUEtiapine (SEROQUEL) 800 mg, Oral, Daily at bedtime  . tamsulosin (FLOMAX) 0.4 mg, Oral, Daily after supper  . timolol (TIMOPTIC)  0.5 % ophthalmic solution 1 drop, 2 times daily  . vitamin B-12 (CYANOCOBALAMIN) 500 mcg, Oral, Daily  . Vitamin D 2,000 Units, Oral, Daily   Exam: Current vital signs: BP (!) 98/59   Pulse 63   Temp (!) 90.7 F (32.6 C) (Rectal) Comment: Arrived from 3W  Resp 15   Ht 6\' 1"  (1.854 m)   Wt 83 kg   SpO2 100%   BMI 24.14 kg/m  Vital signs in last 24 hours: Temp:  [89.4 F (31.9 C)-98.7 F (37.1 C)] 90.7 F (32.6 C) (01/15 0500) Pulse Rate:  [61-110] 63 (01/15 0705) Resp:  [13-18] 15 (01/15 0705) BP: (82-161)/(52-140) 98/59 (01/15 0705) SpO2:  [59 %-100 %] 100 % (01/15 0705) Weight:  [83 kg] 83 kg (01/15 0500)  GENERAL: Lethargic, laying in bed, no acute distress HEENT: - Normocephalic and atraumatic LUNGS - Normal respiratory effort, cough/congestion heard when patient wakes CV - regular rate on cardiac monitor ABDOMEN - Soft, non-distended Ext: warm without edema  NEURO:  Mental Status: opens eyes to voice. Groans and coughs but no attempt at verbalization. Unable to assess speech, comprehension, repetition.  Cranial Nerves:  II: PERRL 3 mm/brisk. Unable to assess visual fields.  III, IV, VI: Gaze preference to the right. Does not fully look left to attend to examiner. Brief eye opening to verbal stimulus, does not resist passive eye opening. V: Unable to assess facial sensation.  VII: Subtle nasolabial fold flattening on right face but symmetric face movement noted with grimace to noxious stimuli. VIII: Hearing intact to voice IX, X: Grunts and moans but does not attempt to phonate.  XI: Unable to assess XII: Unable to assess tongue protrusion.   Motor: Left upper extremity withdrawals to noxious stimuli with noted increase in tone. Wiggles toes to command with bilateral lower extremities. LLE 2/5 with minimal withdrawal to noxious stimuli but without antigravity movement. RLE flaccid. RUE flaccid with mild increase in tone. Bulk is normal.  Sensation- Grimace noted with  application of noxious stimuli to the left upper and lower extremities. No grimace noted with application of noxious stimuli to the right upper and lower extremities. Unable to assess sensation to light touch or temperature. Coordination: unable to assess.  DTRs: hyperreflexia noted on left upper extremity.   Gait- deferred  Labs I have reviewed labs in epic and the results pertinent to this consultation are: CBC    Component Value Date/Time   WBC 12.7 (H) 05/11/2020 0440   RBC 4.17 (L) 05/11/2020 0440   HGB 14.7 05/11/2020 0440   HGB 13.2 04/16/2020 1321   HCT 39.8 05/11/2020 0440   PLT 307 05/11/2020 0440   PLT 159 04/16/2020 1321   MCV 95.4 05/11/2020 0440   MCV 103.1 (A) 01/10/2013 0839   MCH 35.3 (H) 05/11/2020 0440   MCHC 36.9 (H) 05/11/2020 0440   RDW 12.9 05/11/2020 0440   LYMPHSABS 1.0 05/11/2020 0440   MONOABS 0.7 05/11/2020 0440   EOSABS 0.0 05/11/2020 0440   BASOSABS 0.0 05/11/2020 0440  CMP     Component Value Date/Time   NA 131 (L) 05/11/2020 0440   NA 140 07/17/2015 0000   K 4.3 05/11/2020 0440   CL 95 (L) 05/11/2020 0440   CO2 15 (L) 05/11/2020 0440   GLUCOSE 146 (H) 05/11/2020 0440   BUN 24 (H) 05/11/2020 0440   BUN 9 07/17/2015 0000   CREATININE 2.16 (H) 05/11/2020 0440   CREATININE 1.20 04/16/2020 1321   CALCIUM 9.8 05/11/2020 0440   PROT 6.4 (L) 05/11/2020 0440   ALBUMIN 3.9 05/11/2020 0440   AST 35 05/11/2020 0440   AST 24 04/16/2020 1321   ALT 25 05/11/2020 0440   ALT 18 04/16/2020 1321   ALKPHOS 67 05/11/2020 0440   BILITOT 2.0 (H) 05/11/2020 0440   BILITOT 0.6 04/16/2020 1321   GFRNONAA 32 (L) 05/11/2020 0440   GFRNONAA >60 04/16/2020 1321   GFRAA >60 03/21/2019 0929   Lipid Panel     Component Value Date/Time   CHOL 186 03/14/2020 0850   TRIG 253.0 (H) 03/14/2020 0850   HDL 107.70 03/14/2020 0850   CHOLHDL 2 03/14/2020 0850   VLDL 50.6 (H) 03/14/2020 0850   LDLCALC 54 04/11/2019 1206   LDLDIRECT 41.0 03/14/2020 0850    Imaging I have reviewed the images obtained:  CT-scan of the brain 05/11/2020 IMPRESSION: 1. Further regression of bilateral extra-axial hemorrhage since 05/06/2020, only small volume residual. Superimposed chronic bilateral subdural hygromas remain stable. Resolved intraventricular blood. 2. No significant intracranial mass effect. No new intracranial abnormality.  MRI examination of the brain- pending  Assessment: Mr. Kirchgessner is a 70 year old male initially admitted to the hospital 04/30/2020 following a subarachnoid hemorrhage. His history is significant for ETOH abuse, hyperlipidemia, GERD, and bipolar disorder. Early morning 05/11/2020, Mr. Bloodgood wandered away from his room and was found outside of the hospital unresponsive and hypothermic --neurology consulted for altered mental status.   Exam somewhat concerning for right-sided weakness stroke from a spasm versus seizure with postictal state.  Impression: - Altered mental status  - Recent acute change in mental status due to subarachnoid hemorrhage 04/30/2020 - New onset right-sided weakness and extinction- concern for acute ischemic event. Etiology possibly seizure versus vasospasm with new ischemia.   Recommendations: - EEG stat-slowing only.  No evidence of seizures. - MRI brain without contrast when able to. - Maintain normothermia -No anticoagulation given recent subarachnoid.  He has been started on aspirin by primary team-we will defer to primary team and neurosurgery. - Discontinue alprazolam and phenergan; may use Ativan 1 mg IV q8h PRN for agitation, anxiety, or seizure - High dose Thiamine 500 mg IV TID for 3 days; then 250 mg daily for 5 days due to ETOH history  -- Pt seen by NP/Neuro with MD.  Anibal Henderson, AGAC-NP [facility] Pager: [phone]  Attending Neurohospitalist Addendum Patient seen and examined with APP/Resident. Agree with the history and physical as documented above. Agree with the  plan as documented, which I helped formulate. I have independently reviewed the chart, obtained history, review of systems and examined the patient.I have personally reviewed pertinent head/neck/spine imaging (CT/MRI). Briefly 70 year old man with a recent subarachnoid hemorrhage, angio negative, was recovering in the neuro floor and went missing and later found 2 hours later right outside hospital.  Exam with some right-sided weakness as noted above. Concern for vasospasm versus stroke versus seizure. EEG revealed slowing only.  Do not see a need for prophylactic antiepileptic at this time. Will need an MRI.  Also on  a bunch of sedating medications-see recommendations above to wean/discontinue. We will follow. Please feel free to call with any questions.  -- Amie Portland, MD Neurologist [facility] Pager: [phone]   CRITICAL CARE ATTESTATION Performed by: Amie Portland, MD Total critical care time:45 minutes Critical care time was exclusive of separately billable procedures and treating other patients and/or supervising APPs/Residents/Students Critical care was necessary to treat or prevent imminent or life-threatening deterioration due to [facility], toxic metabolic encephalopathy, possible vasospasm, possible stroke This patient is critically ill and at significant risk for neurological worsening and/or death and care requires constant monitoring. Critical care was time spent personally by me on the following activities: development of treatment plan with patient and/or surrogate as well as nursing, discussions with consultants, evaluation of patient's response to treatment, examination of patient, obtaining history from patient or surrogate, ordering and performing treatments and interventions, ordering and review of laboratory studies, ordering and review of radiographic studies, pulse oximetry, re-evaluation of patient's condition, participation in multidisciplinary rounds and  medical decision making of high complexity in the care of this patient.

## 2020-05-11 NOTE — Progress Notes (Signed)
Stat  EEG complete - results pending.  

## 2020-05-11 NOTE — Progress Notes (Signed)
Pharmacy Antibiotic Note  Jeffrey Acosta is a 70 y.o. male admitted on 04/30/2020 with SAH.  Pharmacy has been consulted for Vancomycin dosing. WBC is elevated. Noted bump in Scr. Lactic acid elevated.   Plan: Vancomycin 1000 mg IV q24h >>Estimated AUC: 479 Zosyn per MD Trend WBC, temp, renal function  F/U infectious work-up Drug levels as indicated   Height: 6\' 1"  (185.4 cm) Weight: 83 kg (182 lb 15.7 oz) IBW/kg (Calculated) : 79.9  Temp (24hrs), Avg:96.1 F (35.6 C), Min:89.4 F (31.9 C), Max:98.7 F (37.1 C)  Recent Labs  Lab 05/07/20 0446 05/08/20 0345 05/09/20 0252 05/10/20 0411 05/11/20 0440  WBC 7.8  --   --  8.5 12.7*  CREATININE 1.03 0.99 0.80 0.97 2.16*  LATICACIDVEN  --   --   --   --  >11.0*    Estimated Creatinine Clearance: 35.4 mL/min (A) (by C-G formula based on SCr of 2.16 mg/dL (H)).    Allergies  Allergen Reactions  . Omnipaque [Iohexol] Hives     Desc: developed hives after injection; resolved with 50 mg benadryl given to pt.    Narda Bonds, PharmD, BCPS Clinical Pharmacist Phone: [phone]

## 2020-05-11 NOTE — Progress Notes (Addendum)
[place] Progress Note Patient Name: Jeffrey Acosta DOB: Dec 09, 1950 MRN: [id number]   Date of Service  05/11/2020  HPI/Events of Note  Serum lactic acid >11.  eICU Interventions  NS 1000 ml iv bolus, Echocardiogram to assess LV function, empiric Vancomycin  And Zosyn after cultures sent.                                                 Kerry Kass Jyles Sontag 05/11/2020, 6:26 AM

## 2020-05-12 ENCOUNTER — Inpatient Hospital Stay (HOSPITAL_COMMUNITY): Payer: Medicare Other

## 2020-05-12 DIAGNOSIS — I639 Cerebral infarction, unspecified: Secondary | ICD-10-CM | POA: Diagnosis not present

## 2020-05-12 DIAGNOSIS — I609 Nontraumatic subarachnoid hemorrhage, unspecified: Secondary | ICD-10-CM | POA: Diagnosis not present

## 2020-05-12 LAB — BASIC METABOLIC PANEL
Anion gap: 15 (ref 5–15)
BUN: 27 mg/dL — ABNORMAL HIGH (ref 8–23)
CO2: 14 mmol/L — ABNORMAL LOW (ref 22–32)
Calcium: 8.5 mg/dL — ABNORMAL LOW (ref 8.9–10.3)
Chloride: 105 mmol/L (ref 98–111)
Creatinine, Ser: 2.03 mg/dL — ABNORMAL HIGH (ref 0.61–1.24)
GFR, Estimated: 34 mL/min — ABNORMAL LOW (ref >60)
Glucose, Bld: 105 mg/dL — ABNORMAL HIGH (ref 70–99)
Potassium: 3.9 mmol/L (ref 3.5–5.1)
Sodium: 134 mmol/L — ABNORMAL LOW (ref 135–145)

## 2020-05-12 LAB — CBC
HCT: 33.3 % — ABNORMAL LOW (ref 39.0–52.0)
Hemoglobin: 12.3 g/dL — ABNORMAL LOW (ref 13.0–17.0)
MCH: 35.2 pg — ABNORMAL HIGH (ref 26.0–34.0)
MCHC: 36.9 g/dL — ABNORMAL HIGH (ref 30.0–36.0)
MCV: 95.4 fL (ref 80.0–100.0)
Platelets: 273 10*3/uL (ref 150–400)
RBC: 3.49 MIL/uL — ABNORMAL LOW (ref 4.22–5.81)
RDW: 13 % (ref 11.5–15.5)
WBC: 17.5 10*3/uL — ABNORMAL HIGH (ref 4.0–10.5)
nRBC: 0 % (ref 0.0–0.2)

## 2020-05-12 MED ORDER — SODIUM CHLORIDE 0.9 % IV SOLN: INTRAVENOUS | Status: DC | PRN

## 2020-05-12 MED ORDER — HYDRALAZINE HCL 25 MG PO TABS: 25.0000 mg | ORAL_TABLET | Freq: Four times a day (QID) | ORAL | Status: DC | PRN

## 2020-05-12 NOTE — Progress Notes (Signed)
STROKE TEAM PROGRESS NOTE   HISTORY OF PRESENT ILLNESS (per record) Jeffrey Acosta is a 70 y.o. male with a medical history significant for recent [facility] 04/30/2020, ETOH and tobacco abuse, bipolar disorder, hypothyroidism, diverticulosis, GERD, hyperlipidemia, osteopenia, and polyarthralgia.  04/30/2020:              - Initial admission following sudden collapse with fall at home.              - CT: moderate to large amount of acute SAH, felt consistent with ruptured aneurysm with a small amount of bilateral IVH and slight increase in size of lateral and third ventricles. 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 05/11/2020:              - Patient wandered off away from inpatient room for approximately 2 hours. Per chart review, patient later reported that he had gone to smoke a cigarette. Was found down outside of the hospital unresponsive and hypothermic. Transferred to ICU at this time.              - CT head: 1. Further regression of bilateral extra-axial hemorrhage since 05/06/2020, only small volume residual. Superimposed chronic bilateral subdural hygromas remain stable. Resolved intraventricular blood. 2. No significant intracranial mass effect. No new intracranial abnormality.             - Per family at bedside, yesterday 05/10/2020 Jeffrey Acosta's mental status was concerning for aphasia and inattention to conversation, decrease in responsiveness.    INTERVAL HISTORY His RN is at the bedside.   Patient is awake and interactive.  Is disoriented and confused but follows simple commands.  Moves all 4 extremities.  MRI shows tiny punctate left basal ganglia and periventricular white matter temporal infarcts subarachnoid hemorrhage appears to have improved.  Blood pressure adequately controlled.    OBJECTIVE Vitals:   05/12/20 0400 05/12/20 0500 05/12/20 0600 05/12/20 0700  BP: (!) 150/75 (!) 144/84 133/78 (!) 149/81  Pulse: 91 95 81 88  Resp: 19 (!) 22 (!) 21 19  Temp: 98.96 F (37.2 C)  99.32 F (37.4 C)  99.14 F (37.3 C)  TempSrc: Core     SpO2: 100% 100% 100% 100%  Weight:      Height:        CBC:  Recent Labs  Lab 05/11/20 0440 05/11/20 1005 05/12/20 0056  WBC 12.7*  --  17.5*  NEUTROABS 10.8*  --   --   HGB 14.7 11.2* 12.3*  HCT 39.8 33.0* 33.3*  MCV 95.4  --  95.4  PLT 307  --  628    Basic Metabolic Panel:  Recent Labs  Lab 05/10/20 0411 05/11/20 0440 05/11/20 1005 05/12/20 0056  NA 126* 131* 128* 134*  K 3.7 4.3 3.6 3.9  CL 93* 95*  --  105  CO2 21* 15*  --  14*  GLUCOSE 86 146*  --  105*  BUN 16 24*  --  27*  CREATININE 0.97 2.16*  --  2.03*  CALCIUM 9.1 9.8  --  8.5*  MG 2.1 2.7*  --   --     Lipid Panel:     Component Value Date/Time   CHOL 186 03/14/2020 0850   TRIG 253.0 (H) 03/14/2020 0850   HDL 107.70 03/14/2020 0850   CHOLHDL 2 03/14/2020 0850   VLDL 50.6 (H) 03/14/2020 0850   LDLCALC 54 04/11/2019 1206   HgbA1c:  Lab Results  Component Value Date   HGBA1C 4.3 (L)  01/21/2017   Urine Drug Screen:     Component Value Date/Time   LABOPIA NONE DETECTED 05/01/2020 0259   COCAINSCRNUR NONE DETECTED 05/01/2020 0259   COCAINSCRNUR NEG 09/11/2014 1505   LABBENZ NONE DETECTED 05/01/2020 0259   LABBENZ NEG 09/11/2014 1505   AMPHETMU NONE DETECTED 05/01/2020 0259   THCU NONE DETECTED 05/01/2020 0259   LABBARB NONE DETECTED 05/01/2020 0259    Alcohol Level     Component Value Date/Time   ETH 328 (HH) 04/30/2020 2038    IMAGING  CT HEAD WO CONTRAST 04/30/20 IMPRESSION: 1. Moderate to large amount of acute subarachnoid /extra-axial hemorrhage, most heavily concentrated at the suprasellar cistern, and anterior to the brainstem. Findings felt consistent with ruptured aneurysm. Additional foci of extra-axial blood within the posterior fossa, left greater than right middle cranial fossa, and anterior inter hemispheric region. Small amount of bilateral intraventricular and fourth ventricular hemorrhage. Slight interval  increase in size/dilatation of lateral and third ventricles compared to previous. 2. Atrophy.  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  CTA HEAD AND NECK 05/01/20 IMPRESSION: 1. Negative intracranial CTA. No visible aneurysm or other vascular abnormality to explain patient's intracranial hemorrhage. 2. No large vessel occlusion. 3. Mild for age atheromatous change about the carotid bifurcations and carotid siphons without hemodynamically significant stenosis. 4. Age-indeterminate compression deformities at the superior endplates of C7, T3, and T4. Correlation with physical exam recommended. Findings could be further assessed with dedicated MRI as clinically warranted.  CT HEAD WO CONTRAST 05/11/2020 IMPRESSION:  1. Further regression of bilateral extra-axial hemorrhage since 05/06/2020, only small volume residual. Superimposed chronic bilateral subdural hygromas remain stable. Resolved intraventricular blood.  2. No significant intracranial mass effect. No new intracranial abnormality.   MR BRAIN WO CONTRAST 05/11/2020   IMPRESSION:  1. Small acute infarcts in the left basal ganglia, left periatrial temporal lobe and high bilateral frontoparietal region. No substantial associated edema or mass effect.  2. The ventricular system is slightly increased in size compared to the prior MRI with mild convexity of the third ventricle and rounding of the temporal horns, concerning for developing hydrocephalus  3. When compared to prior MRI, decreased small volume acute/subacute parafalcine subdural hemorrhage and scattered subarachnoid hemorrhage, as detailed above. Similar bilateral subdural hygromas.   DG Elbow 2 Views Right 05/11/2020 IMPRESSION:  Negative.   DG Knee 1-2 Views Right 05/11/2020 IMPRESSION:  1. No acute fracture or dislocation identified about the right knee  2. Advanced calcified peripheral vascular disease.   DG Hand 2 View Right 05/11/2020 IMPRESSION:  1.  Difficult to exclude scaphoid fracture on these two views (and lateral view is oblique). Recommend dedicated Four View Right Wrist Series if pain in this region persists.  2. Otherwise no acute fracture or dislocation identified about the right hand.   DG Abd Portable 1V 05/11/2020 IMPRESSION:  Nasogastric tube tip seen in proximal stomach. No evidence of bowel obstruction or ileus.   ECHOCARDIOGRAM COMPLETE 05/11/2020 IMPRESSIONS   1. Left ventricular ejection fraction, by estimation, is 55 to 60%. The left ventricle has normal function. The left ventricle has no regional wall motion abnormalities. There is moderate left ventricular hypertrophy. Left ventricular diastolic parameters are consistent with Grade I diastolic dysfunction (impaired relaxation).   2. Right ventricular systolic function is mildly reduced. The right ventricular size is mildly enlarged.   3. Left atrial size was mildly dilated.   4. The mitral valve is normal in structure. Trivial mitral valve regurgitation. No evidence of mitral stenosis.  5. The aortic valve is tricuspid. Aortic valve regurgitation is mild. Mild aortic valve stenosis.   6. Aortic dilatation noted. There is mild dilatation of the aortic root, measuring 41 mm.   7. The inferior vena cava is normal in size with greater than 50% respiratory variability, suggesting right atrial pressure of 3 mmHg.   EEG 05/11/2020 ABNORMALITY - Continuous slow, generalized  IMPRESSION:  This study is suggestive of moderate diffuse encephalopathy, nonspecific etiology. No seizures or epileptiform discharges were seen throughout the recording.    ECG - SR rate 72 BPM. Anteroseptal infarct age undetermined. (See cardiology reading for complete details)  PHYSICAL EXAM Blood pressure (!) 149/81, pulse 88, temperature 99.14 F (37.3 C), resp. rate 19, height 6\' 1"  (1.854 m), weight 83 kg, SpO2 100 %. Pleasant middle-age Caucasian male or in distress . Afebrile. Head is  nontraumatic. Neck is supple without bruit.    Cardiac exam no murmur or gallop. Lungs are clear to auscultation. Distal pulses are well felt.  Neurological Exam : Awake alert oriented to place only.  Diminished attention, registration and recall.  Easy distractibility.  Poor insight into his condition.  Follows simple midline and one-step commands.  Speech is nonfluent and hesitant with occasional word finding difficulty with good comprehension and able to name and repeat well.  Extraocular movements are full range without nystagmus.  Blinks to threat bilaterally.  Face is symmetric without weakness.  Tongue midline.  Motor system exam no upper or lower extremity drift symmetric and equal strength in all 4 extremities.  No facial mild action tremor of both outstretched upper extremities.  Deep tendon reflexes symmetric.  Plantars downgoing.  Withdraws to painful stimuli equally in all 4 extremities.  Gait not tested.  ASSESSMENT/PLAN Jeffrey Acosta is a 70 y.o. male with history of ETOH and tobacco abuse, bipolar disorder, atrial tachycardia, migraines, hypothyroidism, diverticulosis, GERD, hyperlipidemia, osteopenia, and polyarthralgia who was admitted to [facility] 04/30/20 with a large SAH. Neurosurgery was consulted and no surgery was felt to be indicated at that time. On 05/10/20, while still an inpatient, the family noted aphasia, inattention to conversation, and decreased responsiveness. Yesterday, 05/11/20, the pt. apparently left his hospital room to have a cigarette and was found down, outside, hypothermic and unresponsive. He was transferred to the NICU. He did not receive IV t-PA due to Alligator.  Stroke: [facility] - acute subarachnoid /extra-axial hemorrhage, most heavily concentrated at the suprasellar cistern, and anterior to the brainstem - possibly 2/2 nonaneurysmal perimesencephalic hemorrhage  Resultant encephalopathy and mild nonfluent speech  Code Stroke CT Head - not ordered  CT head 04/30/20 -  Moderate to large amount of acute subarachnoid /extra-axial hemorrhage, most heavily concentrated at the suprasellar cistern, and anterior to the brainstem. Findings felt consistent with ruptured aneurysm.  CT head 05/11/20 - Further regression of bilateral extra-axial hemorrhage since 05/06/2020, only small volume residual. Superimposed chronic bilateral subdural hygromas remain stable. Resolved intraventricular blood.   MRI head - Small acute infarcts in the left basal ganglia, left periatrial temporal lobe and high bilateral frontoparietal region. No substantial associated edema or mass effect. The ventricular system is slightly increased in size compared to the prior MRI with mild convexity of the third ventricle and rounding of the temporal horns, concerning for developing hydrocephalus. When compared to prior MRI, decreased small volume acute/subacute parafalcine subdural hemorrhage and scattered subarachnoid hemorrhage, as detailed above. Similar bilateral subdural hygromas.   MRA head - not ordered  CTA H&N -  Negative  intracranial CTA. No visible aneurysm or other vascular abnormality to explain patient's intracranial hemorrhage. No large vessel occlusion. Mild for age atheromatous change about the carotid bifurcations and carotid siphons without hemodynamically significant stenosis. Age-indeterminate compression deformities at the superior endplates of C7, T3, and T4. Correlation with physical exam recommended. Findings could be further assessed with dedicated MRI as clinically warranted.  CT Perfusion - not ordered  Carotid Doppler - CTA neck ordered - carotid dopplers not indicated.  EEG - moderate diffuse encephalopathy, nonspecific etiology. No seizures or epileptiform discharges were seen throughout the recording.  2D Echo - EF 55 - 60%. No cardiac source of emboli identified.   Sars Corona Virus 2 - negative  LDL - pending (41 OV 03/14/20)   HgbA1c - pending  UDS -  negative  ETOH - 328 on 04/30/20  VTE prophylaxis - SCDs Diet  Diet Order            Diet NPO time specified  Diet effective now           Diet - low sodium heart healthy                 aspirin 81 mg daily prior to admission, now on No antithrombotic  Ongoing aggressive stroke risk factor management  Therapy recommendations:  SNF  Disposition:  Pending  Hypertension  Home BP meds: Toprol XL 100 mg daily  Current BP meds: Apresoline 25 mg Q 6 hrs ; Nimodipine 60 mg Q 4 hrs  Stable . Systolic blood pressure goal < 160 mm Hg . Long-term BP goal normotensive  Hyperlipidemia  Home Lipid lowering medication: Lipitor 20 mg daily  LDL 41 (OV 03/14/20), goal < 70  Current lipid lowering medication: Lipitor 20 mg daily -> will D/C - (statin contraindicated with ICH)   Other Stroke Risk Factors  Advanced age  Cigarette smoker - advised to stop smoking  Previous ETOH abuse  Family hx stroke (grandparents)   Migraines  Other Active Problems, Findings, Recommendations and/or Plan  Code status - Full code  Sepsis - Zosyn started 1/15 ; Vancomycin added 1/16  Leukocytosis - WBC's - 12.7->17.5  T max - 99.3  Vasospasm prophylaxis - nimodipine 60 mg Q 4 hrs  Will check lipids and HgbA1c   Advanced calcified peripheral vascular disease by imaging  AKI - creatinine - 0.97->2.16->2.03    Hyponatremia - Na - 126->131->128->134     Possible Rt wrist fx by imaging - may need f/u (see above)  Hospital day # 12 Patient's neurological exam is more consistent with encephalopathy likely related to his hypothermia, possible sepsis and dehydration.  His infarcts are mostly subcortical likely related to small vessel disease in the frontal cortical infarcts may just reflect convexity subarachnoid blood rather than actual strokes.  Recommend check transcranial Doppler study to look for vasospasm.  Patient should also get elective cerebral catheter angiogram to rule out a  small aneurysm missed on the CT angiogram earlier.  Continue nimodipine for now but will discontinue if no vasospasm found on cerebral catheter angiogram.  Discussed with Dr. Duffy Rhody neurosurgery and Dr. Opal Sidles.  Loanne Drilling critical care MD. This patient is critically ill and at significant risk of neurological worsening, death and care requires constant monitoring of vital signs, hemodynamics,respiratory and cardiac monitoring, extensive review of multiple databases, frequent neurological assessment, discussion with family, other specialists and medical decision making of high complexity.I have made any additions or clarifications directly to the above note.This critical care time  does not reflect procedure time, or teaching time or supervisory time of PA/NP/Med Resident etc but could involve care discussion time.  I spent 30 minutes of neurocritical care time  in the care of  this patient.    Antony Contras, MD To contact Stroke Continuity provider, please refer to [email or web address] After hours, contact General Neurology

## 2020-05-12 NOTE — Progress Notes (Addendum)
Subjective: Patient reports no headaches.  Objective: Vital signs in last 24 hours: Temp:  [98.78 F (37.1 C)-99.5 F (37.5 C)] 99.14 F (37.3 C) (01/16 0900) Pulse Rate:  [71-110] 99 (01/16 0900) Resp:  [14-22] 17 (01/16 0900) BP: (132-172)/(72-139) 172/72 (01/16 0900) SpO2:  [99 %-100 %] 100 % (01/16 0900)  Intake/Output from previous day: 01/15 0701 - 01/16 0700 In: 1960.1 [I.V.:377; IV Piggyback:1583.1] Out: 2000 [Urine:2000] Intake/Output this shift: Total I/O In: 270.5 [I.V.:233.1; IV Piggyback:37.4] Out: -   Patient slightly restless but is directable.  Oriented to person, hospital, January "2002" FC x 4 but in restraints, mild right sided weakness.  Lab Results: Recent Labs    05/11/20 0440 05/11/20 1005 05/12/20 0056  WBC 12.7*  --  17.5*  HGB 14.7 11.2* 12.3*  HCT 39.8 33.0* 33.3*  PLT 307  --  273   BMET Recent Labs    05/11/20 0440 05/11/20 1005 05/12/20 0056  NA 131* 128* 134*  K 4.3 3.6 3.9  CL 95*  --  105  CO2 15*  --  14*  GLUCOSE 146*  --  105*  BUN 24*  --  27*  CREATININE 2.16*  --  2.03*  CALCIUM 9.8  --  8.5*    Studies/Results: EEG  Result Date: 05/11/2020 Lora Havens, MD     05/11/2020 10:02 AM Patient Name: Jeffrey Acosta MRN: [id number] Epilepsy Attending: Lora Havens Referring Physician/Provider: Dr Margaretha Seeds Date: 1/05/01/2020 Duration: 26.46 mins Patient history: 70 year old male initially admitted to the hospital 04/30/2020 following a subarachnoid hemorrhage. His history is significant for ETOH abuse, hyperlipidemia, GERD, and bipolar disorder. Early morning 05/11/2020, Jeffrey Acosta wandered away from his room and was found outside of the hospital unresponsive and hypothermic concerning for ischemic stroke secondary to vasospasm versus seizure. EEG to evaluate for seizure Level of alertness:  lethargic AEDs during EEG study: None Technical aspects: This EEG study was done with scalp electrodes positioned according to the  10-20 International system of electrode placement. Electrical activity was acquired at a sampling rate of 500Hz  and reviewed with a high frequency filter of 70Hz  and a low frequency filter of 1Hz . EEG data were recorded continuously and digitally stored. Description: EEG showed continuous generalized 3 to 6 Hz theta-delta slowing.  Hyperventilation and photic stimulation were not performed.   ABNORMALITY -Continuous slow, generalized IMPRESSION: This study is suggestive of moderate diffuse encephalopathy, nonspecific etiology. No seizures or epileptiform discharges were seen throughout the recording. Lora Havens   DG Elbow 2 Views Right  Result Date: 05/11/2020 CLINICAL DATA:  70 year old male status post fall with pain. EXAM: RIGHT ELBOW - 2 VIEW COMPARISON:  None. FINDINGS: Bone mineralization is within normal limits. Preserved joint spaces and alignment. No evidence of joint effusion. Radial head appears intact. No acute osseous abnormality identified. No discrete soft tissue injury. IMPRESSION: Negative. Electronically Signed   By: Genevie Ann M.D.   On: 05/11/2020 05:33   DG Knee 1-2 Views Right  Result Date: 05/11/2020 CLINICAL DATA:  70 year old male status post fall with pain. EXAM: RIGHT KNEE - 1-2 VIEW COMPARISON:  None. FINDINGS: No joint effusion on cross-table lateral view. Extensive calcified atherosclerosis in the visible right lower extremity. Preserved joint spaces and alignment at the right knee. Patella intact. Bone mineralization is within normal limits. No acute osseous abnormality identified. IMPRESSION: 1. No acute fracture or dislocation identified about the right knee 2. Advanced calcified peripheral vascular disease. Electronically Signed   By:  Genevie Ann M.D.   On: 05/11/2020 05:29   DG Abd 1 View  Result Date: 05/12/2020 CLINICAL DATA:  70 year old male NG tube placement, repositioning. EXAM: ABDOMEN - 1 VIEW COMPARISON:  0806 hours today. FINDINGS: Portable AP semi upright view  at 0845 hours. Enteric tube has been pulled back slightly and appears in satisfactory configuration in the left upper quadrant. Position compatible with placement into the proximal stomach, side hole the level of the proximal gastric body. Negative lung bases. Stable bowel gas pattern. Stable visualized osseous structures. Chronic left lower rib fractures. IMPRESSION: Enteric tube reposition with satisfactory configuration now. Side hole at the level of the proximal gastric body. Electronically Signed   By: Genevie Ann M.D.   On: 05/12/2020 09:04   DG Abd 1 View  Result Date: 05/12/2020 CLINICAL DATA:  Enteric tube placement EXAM: ABDOMEN - 1 VIEW COMPARISON:  05/11/2020 abdominal radiograph FINDINGS: Enteric tube coils in the distal stomach with possible kink at the level of the side port with tip in the proximal stomach. Borderline dilated small bowel loops throughout the abdomen up to 3.0 cm diameter. No evidence of pneumatosis or pneumoperitoneum. No radiopaque nephrolithiasis. Mild lumbar spondylosis. IMPRESSION: 1. Enteric tube coils in the distal stomach with possible kink at the level of the side port with tip in the proximal stomach. Suggest repositioning. 2. Borderline dilated small bowel loops throughout the abdomen up to 3.0 cm diameter, cannot exclude partial distal small bowel obstruction or mild adynamic ileus. Electronically Signed   By: Ilona Sorrel M.D.   On: 05/12/2020 08:19   CT HEAD WO CONTRAST  Result Date: 05/11/2020 CLINICAL DATA:  70 year old with altered mental status. Found wandering. Fall with intracranial hemorrhage earlier this month. EXAM: CT HEAD WITHOUT CONTRAST TECHNIQUE: Contiguous axial images were obtained from the base of the skull through the vertex without intravenous contrast. COMPARISON:  Brain MRI 05/04/2020.  Head CT 05/06/2020 and earlier. FINDINGS: Brain: Chronic bilateral subdural hygromas. The CSF density portion of the collections appear stable and size and  configuration. There are some superimposed hyperdense blood products within the hygroma on the left (series 8, image 29), although decreased since 05/06/2020. Nearby left sylvian fissure and other bilateral basilar cistern subarachnoid hemorrhage has also significantly regressed from 05/06/2020, only trace residual. The largest area of residual subarachnoid hemorrhage is in the left posterior fossa lateral to the cerebellum on series 8, image 46, decreased. Intraventricular blood has resolved by CT.  No ventriculomegaly. Small volume para falcine subdural blood is also stable to regressed. No midline shift or significant intracranial mass effect. No new new or increased areas of intracranial hemorrhage. Stable gray-white matter differentiation throughout the brain. No cortically based acute infarct identified. Vascular: Calcified atherosclerosis at the skull base. No suspicious intracranial vascular hyperdensity. Skull: Stable.  No fracture identified. Sinuses/Orbits: Visualized paranasal sinuses and mastoids are stable and well pneumatized. Other: Visualized orbits and scalp soft tissues are within normal limits. IMPRESSION: 1. Further regression of bilateral extra-axial hemorrhage since 05/06/2020, only small volume residual. Superimposed chronic bilateral subdural hygromas remain stable. Resolved intraventricular blood. 2. No significant intracranial mass effect. No new intracranial abnormality. Electronically Signed   By: Genevie Ann M.D.   On: 05/11/2020 05:19   MR BRAIN WO CONTRAST  Addendum Date: 05/11/2020   ADDENDUM REPORT: 05/11/2020 18:15 ADDENDUM: Findings also discussed with Dr. Rory Percy at 6:14 p.m. Electronically Signed   By: Margaretha Sheffield MD   On: 05/11/2020 18:15   Addendum Date: 05/11/2020  ADDENDUM REPORT: 05/11/2020 18:09 ADDENDUM: Findings discussed with nurse Brook via telephone at 6:05 p.m. Electronically Signed   By: Margaretha Sheffield MD   On: 05/11/2020 18:09   Result Date:  05/11/2020 CLINICAL DATA:  Mental status change. EXAM: MRI HEAD WITHOUT CONTRAST TECHNIQUE: Multiplanar, multiecho pulse sequences of the brain and surrounding structures were obtained without intravenous contrast. COMPARISON:  CT head May 11, 2020 FINDINGS: Brain: There is redemonstrated small volume of parafalcine subdural hemorrhage. Similar size of left greater than right hygromas with similar appearance of intermixed more acute hemorrhage within the left hygroma and in the left sylvian fissure. Small amount of subarachnoid hemorrhage layers within the suprasellar cistern, basal cisterns and posterior fossa. Overall volume of hemorrhage appears decreased relative to prior MRI from May 04, 2020. There are small acute infarcts in the left basal ganglia, left periatrial temporal lobe and high bilateral frontoparietal regions. No substantial associated edema or mass effect. The ventricular system is slightly increased in size compared to the prior MRI with mild convexity of the third ventricle and rounding of the temporal horns. Vascular: Major arterial flow voids are maintained. Skull and upper cervical spine: Normal marrow signal. Sinuses/Orbits: Mild paranasal sinus mucosal thickening without air-fluid levels. Unremarkable orbits. Other: Motion limited study. IMPRESSION: 1. Small acute infarcts in the left basal ganglia, left periatrial temporal lobe and high bilateral frontoparietal region. No substantial associated edema or mass effect. 2. The ventricular system is slightly increased in size compared to the prior MRI with mild convexity of the third ventricle and rounding of the temporal horns, concerning for developing hydrocephalus 3. When compared to prior MRI, decreased small volume acute/subacute parafalcine subdural hemorrhage and scattered subarachnoid hemorrhage, as detailed above. Similar bilateral subdural hygromas. Electronically Signed: By: Margaretha Sheffield MD On: 05/11/2020 18:02   DG  Hand 2 View Right  Result Date: 05/11/2020 CLINICAL DATA:  70 year old male status post fall with pain. EXAM: RIGHT HAND - 2 VIEW COMPARISON:  None. FINDINGS: PA and oblique lateral views of the right hand. Distal radius and ulna appear intact. Dystrophic or vascular calcifications along the radial aspect of the wrist. On the PA view the scaphoid bone appears irregular, but there is no definite scaphoid fracture on the 2nd view. Other carpal bones appear intact and normally aligned. No metacarpal or phalanx fracture identified. No discrete soft tissue injury. IMPRESSION: 1. Difficult to exclude scaphoid fracture on these two views (and lateral view is oblique). Recommend dedicated Four View Right Wrist Series if pain in this region persists. 2. Otherwise no acute fracture or dislocation identified about the right hand. Electronically Signed   By: Genevie Ann M.D.   On: 05/11/2020 05:32   DG Abd Portable 1V  Result Date: 05/11/2020 CLINICAL DATA:  Nasogastric tube placement. EXAM: PORTABLE ABDOMEN - 1 VIEW COMPARISON:  None. FINDINGS: The bowel gas pattern is normal. Nasogastric tube is seen looped within the stomach with the distal tip in the proximal stomach. No radio-opaque calculi or other significant radiographic abnormality are seen. IMPRESSION: Nasogastric tube tip seen in proximal stomach. No evidence of bowel obstruction or ileus. Electronically Signed   By: Marijo Conception M.D.   On: 05/11/2020 20:26   ECHOCARDIOGRAM COMPLETE  Result Date: 05/11/2020    ECHOCARDIOGRAM REPORT   Patient Name:   Jeffrey Acosta Date of Exam: 05/11/2020 Medical Rec #:  [id number]      Height:       73.0 in Accession #:    [id number]  Weight:       183.0 lb Date of Birth:  07/15/1950      BSA:          2.072 m Patient Age:    70 years       BP:           98/59 mmHg Patient Gender: M              HR:           68 bpm. Exam Location:  Inpatient Procedure: 2D Echo, Cardiac Doppler and Color Doppler                       STAT  ECHO Reported to: Dr. Jenkins Rouge on 05/11/2020 8:51:00 AM. Indications:    I42.9 Cardiomyopathy (unspecified)  History:        Patient has prior history of Echocardiogram examinations, most                 recent 04/19/2019. Risk Factors:Dyslipidemia. Hypothyroidism.                 GERD. Alcoholism.  Sonographer:    Jonelle Sidle Dance Referring Phys: [id number] [place]  1. Left ventricular ejection fraction, by estimation, is 55 to 60%. The left ventricle has normal function. The left ventricle has no regional wall motion abnormalities. There is moderate left ventricular hypertrophy. Left ventricular diastolic parameters are consistent with Grade I diastolic dysfunction (impaired relaxation).  2. Right ventricular systolic function is mildly reduced. The right ventricular size is mildly enlarged.  3. Left atrial size was mildly dilated.  4. The mitral valve is normal in structure. Trivial mitral valve regurgitation. No evidence of mitral stenosis.  5. The aortic valve is tricuspid. Aortic valve regurgitation is mild. Mild aortic valve stenosis.  6. Aortic dilatation noted. There is mild dilatation of the aortic root, measuring 41 mm.  7. The inferior vena cava is normal in size with greater than 50% respiratory variability, suggesting right atrial pressure of 3 mmHg. FINDINGS  Left Ventricle: Left ventricular ejection fraction, by estimation, is 55 to 60%. The left ventricle has normal function. The left ventricle has no regional wall motion abnormalities. The left ventricular internal cavity size was normal in size. There is  moderate left ventricular hypertrophy. Left ventricular diastolic parameters are consistent with Grade I diastolic dysfunction (impaired relaxation). Right Ventricle: The right ventricular size is mildly enlarged. Right vetricular wall thickness was not assessed. Right ventricular systolic function is mildly reduced. Left Atrium: Left atrial size was mildly dilated. Right  Atrium: Right atrial size was normal in size. Pericardium: There is no evidence of pericardial effusion. Mitral Valve: The mitral valve is normal in structure. There is mild thickening of the mitral valve leaflet(s). There is mild calcification of the mitral valve leaflet(s). Mild mitral annular calcification. Trivial mitral valve regurgitation. No evidence  of mitral valve stenosis. Tricuspid Valve: The tricuspid valve is normal in structure. Tricuspid valve regurgitation is not demonstrated. No evidence of tricuspid stenosis. Aortic Valve: The aortic valve is tricuspid. Aortic valve regurgitation is mild. Aortic regurgitation PHT measures 907 msec. Mild aortic stenosis is present. Pulmonic Valve: The pulmonic valve was normal in structure. Pulmonic valve regurgitation is not visualized. No evidence of pulmonic stenosis. Aorta: The aortic root is normal in size and structure and aortic dilatation noted. There is mild dilatation of the aortic root, measuring 41 mm. Venous: The inferior vena cava is normal in size with  greater than 50% respiratory variability, suggesting right atrial pressure of 3 mmHg. IAS/Shunts: The interatrial septum was not well visualized.  LEFT VENTRICLE PLAX 2D LVIDd:         3.30 cm  Diastology LVIDs:         2.40 cm  LV e' lateral:   4.90 cm/s LV PW:         1.30 cm  LV E/e' lateral: 8.5 LV IVS:        1.40 cm LVOT diam:     1.90 cm LV SV:         58 LV SV Index:   28 LVOT Area:     2.84 cm  RIGHT VENTRICLE          IVC RV Basal diam:  2.20 cm  IVC diam: 1.90 cm TAPSE (M-mode): 1.2 cm LEFT ATRIUM             Index       RIGHT ATRIUM          Index LA diam:        4.10 cm 1.98 cm/m  RA Area:     9.87 cm LA Vol (A2C):   41.0 ml 19.79 ml/m RA Volume:   20.20 ml 9.75 ml/m LA Vol (A4C):   38.8 ml 18.73 ml/m LA Biplane Vol: 40.0 ml 19.31 ml/m  AORTIC VALVE LVOT Vmax:   120.00 cm/s LVOT Vmean:  77.200 cm/s LVOT VTI:    0.203 m AI PHT:      907 msec  AORTA Ao Root diam: 4.10 cm Ao Asc diam:   3.90 cm MITRAL VALVE               TRICUSPID VALVE MV Area (PHT): 2.62 cm    TR Peak grad:   16.0 mmHg MV Decel Time: 289 msec    TR Vmax:        200.00 cm/s MV E velocity: 41.60 cm/s MV A velocity: 66.40 cm/s  SHUNTS MV E/A ratio:  0.63        Systemic VTI:  0.20 m                            Systemic Diam: 1.90 cm Jenkins Rouge MD Electronically signed by Jenkins Rouge MD Signature Date/Time: 05/11/2020/9:27:02 AM    Final     Assessment/Plan: 70 yo M with spontaneous SAH, now with small strokes and encephalopathy - MRA pending - likely angiogram tomorrow with Dr. Charlynne Pander 05/12/2020, 11:02 AM

## 2020-05-12 NOTE — Evaluation (Signed)
Speech Language Pathology Evaluation Patient Details Name: Jeffrey Acosta MRN: [id number] DOB: 06-01-1950 Today's Date: 05/12/2020 Time: 7564-3329 SLP Time Calculation (min) (ACUTE ONLY): 14 min  Problem List:  Patient Active Problem List   Diagnosis Date Noted  . Benign essential HTN 05/06/2020  . Subarachnoid hemorrhage ([place]) 04/30/2020  . Leukopenia 07/15/2018  . Thrombocytopenia ([place]) 07/15/2018  . PCP NOTES >>>>>>> 03/23/2015  . Annual physical exam 03/20/2015  . Bronchospasm 09/06/2014  . DJD (degenerative joint disease) 09/06/2014  . Hypothyroidism 08/28/2014  . Tremor 07/29/2014  . Gait disorder 07/29/2014  . Gout 01/08/2014  . Vitamin D deficiency 11/25/2012  . Melanoma of skin, site unspecified 11/25/2012  . 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 Alcoholism in remission ([place]) 06/19/2011  . Diverticulitis of colon (without mention of hemorrhage)(562.11) 06/19/2011  . Bipolar I disorder ([place]) 06/19/2011  . Osteopenia 05/17/2010  . Alcohol abuse 05/16/2010  . HYPERTRIGLYCERIDEMIA 08/02/2009  . Macrocytic anemia 01/10/2009  . ABNORMAL ELECTROCARDIOGRAM 11/26/2008  . REFLUX, ESOPHAGEAL 11/17/2006  . COMMON MIGRAINE 09/22/2006   Past Medical History:  Past Medical History:  Diagnosis Date  . Alcoholism in remission ([place])    in [place]  . Arrhythmia    atrial tachycardia  . Bipolar affective disorder ([place])   . Diverticulosis of colon (without mention of hemorrhage)   . GERD (gastroesophageal reflux disease)   . Gout of multiple sites   . Hiatal hernia   . Hx of migraines   . Hydrocele   . HYPERLIPIDEMIA 01/10/2009   Qualifier: Diagnosis of  By: Ronnald Ramp CMA, [place]    NMR Lipoprofile 2011 LDL could not be calculated  Due to TG of 889  (811  / 750 ),  HDL 50 . Father MI @ 37 PGM CVA early 37s; PGF CVA @72    . Hypothyroidism   . Melanoma of skin, site unspecified 11/25/2012   07/2011 abdominal  Stage 1 Dr Amy [place] Dr Sarajane Jews   . Osteopenia   . Polyarthralgia     Past Surgical History:  Past Surgical History:  Procedure Laterality Date  . BILIARY BRUSHING  12/07/2018   Procedure: BILIARY BRUSHING;  Surgeon: Rush Landmark Telford Nab., MD;  Location: [facility];  Service: Gastroenterology;;  . BILIARY DILATION  12/07/2018   Procedure: BILIARY DILATION;  Surgeon: Irving Copas., MD;  Location: [facility];  Service: Gastroenterology;;  . BIOPSY  12/07/2018   Procedure: BIOPSY;  Surgeon: Irving Copas., MD;  Location: [facility];  Service: Gastroenterology;;  . COLONOSCOPY  2003   negative ; Dr Sharlett Iles  . ERCP N/A 12/07/2018   Procedure: ENDOSCOPIC RETROGRADE CHOLANGIOPANCREATOGRAPHY (ERCP);  Surgeon: Irving Copas., MD;  Location: [facility];  Service: Gastroenterology;  Laterality: N/A;  . ESOPHAGOGASTRODUODENOSCOPY (EGD) WITH PROPOFOL N/A 12/07/2018   Procedure: ESOPHAGOGASTRODUODENOSCOPY (EGD) WITH PROPOFOL;  Surgeon: Rush Landmark Telford Nab., MD;  Location: [facility];  Service: Gastroenterology;  Laterality: N/A;  . EUS N/A 12/07/2018   Procedure: UPPER ENDOSCOPIC ULTRASOUND (EUS) RADIAL;  Surgeon: Rush Landmark Telford Nab., MD;  Location: [facility];  Service: Gastroenterology;  Laterality: N/A;  . INGUINAL HERNIA REPAIR  08/07/2011   Procedure: HERNIA REPAIR INGUINAL ADULT;  Surgeon: Rolm Bookbinder, MD;  Location: [facility];  Service: General;  Laterality: Right;  . MELANOMA EXCISION  07/28/11   Dr Sarajane Jews; stomach and chest  . REMOVAL OF STONES  12/07/2018   Procedure: REMOVAL OF STONES;  Surgeon: Rush Landmark Telford Nab., MD;  Location: [facility];  Service: Gastroenterology;;  . [name]  1951  .  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 UPPER GASTROINTESTINAL ENDOSCOPY  1983   hiatal hernia  . VASECTOMY  1979   HPI:  70 y.o. male admitted 04/30/20 following sudden collapse at home and dx with [facility]. CT: moderate to large amount of acute SAH, felt consistent with ruptured  aneurysm with a small amount of bilateral IVH and slight increase in size of lateral and third ventricles. 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 In 1/15, pt wandered off away from inpatient room for approximately 2 hours. Per chart review, patient later reported that he had gone to smoke a cigarette. Was found down outside of the hospital unresponsive and hypothermic. Transferred to ICU at this time.  MRI 1/15 shows tiny punctate left basal ganglia and periventricular white matter temporal infarcts; subarachnoid hemorrhage appears to have improved. Hx also includes ETOH and tobacco abuse, bipolar disorder, hypothyroidism, diverticulosis, GERD, hyperlipidemia, osteopenia, and polyarthralgia.   Assessment / Plan / Recommendation Clinical Impression    Pt presents with a mild aphasia with decreased comprehension of two-step commands, decreased yes/no reliability for complex information, difficulty with word-retrieval for divergent and more complex naming tasks, perseveratory responses without recognition, difficulty shifting set among automatic sequences (DOW, months, counting - tends to perseverate on prior set and requires cue to alter output), impulsivity and poor self-regulation. He is disoriented to elements of time and situation with poor ability to explain course of hospitalization or reason he is here. He will benefit from SLP f/u while here and in next venue of care. He explains he will be going to rehab at the [facility]    SLP Assessment  SLP Recommendation/Assessment: Patient needs continued Speech Lanaguage Pathology Services SLP Visit Diagnosis: Dysphagia, unspecified (R13.10)    Follow Up Recommendations  Skilled Nursing facility    Frequency and Duration min 2x/week  2 weeks      SLP Evaluation Cognition  Overall Cognitive Status: Impaired/Different from baseline Arousal/Alertness: Awake/alert Orientation Level: Oriented to person;Disoriented to  time;Disoriented to place;Disoriented to situation Attention: Sustained Sustained Attention: Impaired Awareness: Impaired Awareness Impairment: Intellectual impairment Safety/Judgment: Appears intact       Comprehension  Auditory Comprehension Overall Auditory Comprehension: Impaired Yes/No Questions: Within Functional Limits Commands: Impaired Two Step Basic Commands: 50-74% accurate Conversation: Simple Visual Recognition/Discrimination Discrimination: Not tested Reading Comprehension Reading Status: Not tested    Expression Expression Primary Mode of Expression: Verbal Verbal Expression Overall Verbal Expression: Impaired Initiation: No impairment Level of Generative/Spontaneous Verbalization: Conversation Repetition: Impaired Level of Impairment: Sentence level (difficult for low-frequency sounds) Naming: Impairment Responsive: 76-100% accurate Confrontation: Within functional limits Divergent: 0-24% accurate Verbal Errors: Perseveration Written Expression Dominant Hand: Right Written Expression: Not tested   Oral / Motor  Oral Motor/Sensory Function Overall Oral Motor/Sensory Function: Within functional limits Motor Speech Overall Motor Speech: Appears within functional limits for tasks assessed   GO                   Bryan Omura L. Tivis Ringer, [place] CCC/SLP Acute Rehabilitation Services Office number [phone] Pager [phone]  Juan Quam Laurice 05/12/2020, 3:18 PM

## 2020-05-12 NOTE — Progress Notes (Signed)
Delayed entry note  I got a chance to speak with Dr. Duffy Rhody from neurosurgery after my shift yesterday-regarding this patient. Neurosurgery will be discussing possible arteriogram on Monday. Dr. Marcello Moores aware and I appreciate his taking the call last evening, and evaluation of the patient. Stroke team to follow. -- Amie [place], MD Neurologist [facility] Pager: [phone]

## 2020-05-12 NOTE — Evaluation (Signed)
Clinical/Bedside Swallow Evaluation Patient Details  Name: Jeffrey Acosta MRN: [id number] Date of Birth: 03-10-1951  Today's Date: 05/12/2020 Time: SLP Start Time (ACUTE ONLY): 1440 SLP Stop Time (ACUTE ONLY): 1452 SLP Time Calculation (min) (ACUTE ONLY): 12 min  Past Medical History:  Past Medical History:  Diagnosis Date  . Alcoholism in remission ([place])    in [place]  . Arrhythmia    atrial tachycardia  . Bipolar affective disorder ([place])   . Diverticulosis of colon (without mention of hemorrhage)   . GERD (gastroesophageal reflux disease)   . Gout of multiple sites   . Hiatal hernia   . Hx of migraines   . Hydrocele   . HYPERLIPIDEMIA 01/10/2009   Qualifier: Diagnosis of  By: Ronnald Ramp CMA, [place]    NMR Lipoprofile 2011 LDL could not be calculated  Due to TG of 889  (811  / 750 ),  HDL 50 . Father MI @ 49 PGM CVA early 79s; PGF CVA @72    . Hypothyroidism   . Melanoma of skin, site unspecified 11/25/2012   07/2011 abdominal  Stage 1 Dr Amy Martin[place] Dr Sarajane Jews   . Osteopenia   . Polyarthralgia    Past Surgical History:  Past Surgical History:  Procedure Laterality Date  . BILIARY BRUSHING  12/07/2018   Procedure: BILIARY BRUSHING;  Surgeon: Rush Landmark Telford Nab., MD;  Location: [facility];  Service: Gastroenterology;;  . BILIARY DILATION  12/07/2018   Procedure: BILIARY DILATION;  Surgeon: Irving Copas., MD;  Location: [facility];  Service: Gastroenterology;;  . BIOPSY  12/07/2018   Procedure: BIOPSY;  Surgeon: Irving Copas., MD;  Location: [facility];  Service: Gastroenterology;;  . COLONOSCOPY  2003   negative ; Dr Sharlett Iles  . ERCP N/A 12/07/2018   Procedure: ENDOSCOPIC RETROGRADE CHOLANGIOPANCREATOGRAPHY (ERCP);  Surgeon: Irving Copas., MD;  Location: [facility];  Service: Gastroenterology;  Laterality: N/A;  . ESOPHAGOGASTRODUODENOSCOPY (EGD) WITH PROPOFOL N/A 12/07/2018   Procedure: ESOPHAGOGASTRODUODENOSCOPY (EGD) WITH PROPOFOL;  Surgeon:  Rush Landmark Telford Nab., MD;  Location: [facility];  Service: Gastroenterology;  Laterality: N/A;  . EUS N/A 12/07/2018   Procedure: UPPER ENDOSCOPIC ULTRASOUND (EUS) RADIAL;  Surgeon: Rush Landmark Telford Nab., MD;  Location: [facility];  Service: Gastroenterology;  Laterality: N/A;  . INGUINAL HERNIA REPAIR  08/07/2011   Procedure: HERNIA REPAIR INGUINAL ADULT;  Surgeon: Rolm Bookbinder, MD;  Location: [facility];  Service: General;  Laterality: Right;  . MELANOMA EXCISION  07/28/11   Dr Sarajane Jews; stomach and chest  . REMOVAL OF STONES  12/07/2018   Procedure: REMOVAL OF STONES;  Surgeon: Rush Landmark Telford Nab., MD;  Location: [facility];  Service: Gastroenterology;;  . [name]  1953  . 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 UPPER GASTROINTESTINAL ENDOSCOPY  1983   hiatal hernia  . VASECTOMY  1979   HPI:  70 y.o. male admitted 04/30/20 following sudden collapse at home and dx with [facility]. CT: moderate to large amount of acute SAH, felt consistent with ruptured aneurysm with a small amount of bilateral IVH and slight increase in size of lateral and third ventricles. 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 In 1/15, pt wandered off away from inpatient room for approximately 2 hours. Per chart review, patient later reported that he had gone to smoke a cigarette. Was found down outside of the hospital unresponsive and hypothermic. Transferred to ICU at this time.  MRI 1/15 shows tiny punctate left basal ganglia and periventricular white matter temporal infarcts;  subarachnoid hemorrhage appears to have improved. Hx also includes ETOH and tobacco abuse, bipolar disorder, hypothyroidism, diverticulosis, GERD, hyperlipidemia, osteopenia, and polyarthralgia.   Assessment / Plan / Recommendation Clinical Impression  Pt's swallow function is WNL.  No focal CN deficits; oral mechanism exam unremarkable.  Demonstrated active mastication, the appearance of a brisk  swallow response, and no s/s of aspiration when consuming large, consecutive swallows of thin liquids and when taxed with dual consistencies of regular solids and water. Recommend resuming PO diet and removing NG tube. D/W RN.  No f/u needed for swallowing; SLP will continue therapy for language/cognitive deficits. SLP Visit Diagnosis: Dysphagia, unspecified (R13.10)    Aspiration Risk  No limitations    Diet Recommendation   regular solids, thin liquids  Medication Administration: Whole meds with liquid    Other  Recommendations Oral Care Recommendations: Oral care BID   Follow up Recommendations Skilled Nursing facility      Frequency and Duration min 2x/week          Prognosis        Swallow Study   General HPI: 70 y.o. male admitted 04/30/20 following sudden collapse at home and dx with SAH. CT: moderate to large amount of acute SAH, felt consistent with ruptured aneurysm with a small amount of bilateral IVH and slight increase in size of lateral and third ventricles. 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 In 1/15, pt wandered off away from inpatient room for approximately 2 hours. Per chart review, patient later reported that he had gone to smoke a cigarette. Was found down outside of the hospital unresponsive and hypothermic. Transferred to ICU at this time.  MRI 1/15 shows tiny punctate left basal ganglia and periventricular white matter temporal infarcts; subarachnoid hemorrhage appears to have improved. Hx also includes ETOH and tobacco abuse, bipolar disorder, hypothyroidism, diverticulosis, GERD, hyperlipidemia, osteopenia, and polyarthralgia. Type of Study: Bedside Swallow Evaluation Previous Swallow Assessment: no Diet Prior to this Study: NPO Temperature Spikes Noted: No Respiratory Status: Room air History of Recent Intubation: No Behavior/Cognition: Alert;Cooperative Oral Cavity Assessment: Within Functional Limits Oral Care Completed by SLP: No Oral Cavity  - Dentition: Adequate natural dentition Vision: Functional for self-feeding Self-Feeding Abilities: Needs assist;Able to feed self Patient Positioning: Upright in bed Baseline Vocal Quality: Normal Volitional Cough: Strong Volitional Swallow: Able to elicit    Oral/Motor/Sensory Function Overall Oral Motor/Sensory Function: Within functional limits   Ice Chips Ice chips: Within functional limits   Thin Liquid Thin Liquid: Within functional limits    Nectar Thick Nectar Thick Liquid: Not tested   Honey Thick Honey Thick Liquid: Not tested   Puree Puree: Within functional limits   Solid     Solid: Within functional limits     Jeffrey Acosta, [place] Office number [phone] Pager [phone]  Jeffrey Acosta 05/12/2020,3:17 PM

## 2020-05-12 NOTE — Progress Notes (Signed)
NAME:  Jeffrey Acosta, MRN:  [id number], DOB:  01-Jul-1950, LOS: 36 ADMISSION DATE:  04/30/2020, CONSULTATION DATE:  1/4 REFERRING MD:  Dr. Billy Fischer, CHIEF COMPLAINT:  SAH   Brief History:  70 year old male admitted 1/4 with SAH after collapsing at home with loss of consciousness.  Found to have subarachnoid hemorrhage, CT angios negative for aneurysm or AV malformation.  Likely perimesencephalic SAH  Past Medical History:   has a past medical history of Alcoholism in remission ([place]), Arrhythmia, Bipolar affective disorder ([place]), Diverticulosis of colon (without mention of hemorrhage), GERD (gastroesophageal reflux disease), Gout of multiple sites, Hiatal hernia, migraines, Hydrocele, HYPERLIPIDEMIA (01/10/2009), Hypothyroidism, Melanoma of skin, site unspecified (11/25/2012), Osteopenia, and Polyarthralgia.   Significant Hospital Events:  1/4 admit 1/6 complained of inability to open left eye, pupil was dilated and minimally reactive associated with left III n palsy.  Stat head CT was done which showed no new changes   Consults:  Neurosurgery  Procedures:    Significant Diagnostic Tests:  CT head 1/4 > Moderate to large amount of acute subarachnoid /extra-axial hemorrhage, most heavily concentrated at the suprasellar cistern, and anterior to the brainstem. Findings felt consistent with ruptured aneurysm. Additional foci of extra-axial blood within the posterior fossa, left greater than right middle cranial fossa, and anterior inter hemispheric region. Small amount of bilateral intraventricular and fourth ventricular hemorrhage. Suspected left anterior subdural hygroma as well.  CT C-spine 1/4 > multilevel cervical spondylosis. No acute fracture.  CTA head/neck 1/4 > negative for aneurysm or other vascular abnormality.  No LVO.  Age indeterminate compression deformities at the superior endplates of C7, T3, T4.  CT head 1/6: Unchanged subarachnoid hemorrhage. Minimal intraventricular  reflux. The ventricles measure a few millimeters larger than yesterday, but no ventriculomegaly  CT Head 05/12/19 Regression of bilateral extra-axial hemorrhage since 1/10. Resolved IVH  MR Brain 05/13/19  Small acute infarcts in the left basal ganglia, left periatrial temporal lobe and high bilateral frontoparietal region. Ventricular system slight increased concerning for developing hydrocephalus. Decreased small volume acute/subacute parafalcine subdural hemorrhage and scattered subarachnoid hemorrhage   Micro Data:  COVID 1/4 > neg. Flu 1/4 > neg. MRSA PCR 1/6 > negative  Antimicrobials:     Interim History / Subjective:   EEG with encephalopathy. MRI demonstrated small scattered infarcts. This morning able to say name and follow simple commands. Non-focal exam.  Objective   Blood pressure (!) 172/72, pulse 99, temperature 99.14 F (37.3 C), resp. rate 17, height 6\' 1"  (1.854 m), weight 83 kg, SpO2 100 %.        Intake/Output Summary (Last 24 hours) at 05/12/2020 1018 Last data filed at 05/12/2020 1000 Gross per 24 hour  Intake 2005.65 ml  Output 2000 ml  Net 5.65 ml   Filed Weights   05/09/20 0458 05/10/20 0311 05/11/20 0500  Weight: 88.5 kg 88.3 kg 83 kg   Physical Exam: General: Chronically ill-appearing, no acute distress, mild encephalopathy HENT: [place], AT, OP clear, MMM Eyes: EOMI, no scleral icterus Respiratory: Clear to auscultation bilaterally.  No crackles, wheezing or rales Cardiovascular: RRR, -M/R/G, no JVD GI: BS+, soft, nontender Extremities:-Edema,-tenderness Neuro: Awake, encephalopathic, follows commands, moves extremities x 4, states name   CBC Latest Ref Rng & Units 05/12/2020 05/11/2020 05/11/2020  WBC 4.0 - 10.5 K/uL 17.5(H) - 12.7(H)  Hemoglobin 13.0 - 17.0 g/dL 12.3(L) 11.2(L) 14.7  Hematocrit 39.0 - 52.0 % 33.3(L) 33.0(L) 39.8  Platelets 150 - 400 K/uL 273 - 307  BMP Latest Ref Rng & Units 05/12/2020 05/11/2020 05/11/2020  Glucose 70 - 99  mg/dL 105(H) - 146(H)  BUN 8 - 23 mg/dL 27(H) - 24(H)  Creatinine 0.61 - 1.24 mg/dL 2.03(H) - 2.16(H)  Sodium 135 - 145 mmol/L 134(L) 128(L) 131(L)  Potassium 3.5 - 5.1 mmol/L 3.9 3.6 4.3  Chloride 98 - 111 mmol/L 105 - 95(L)  CO2 22 - 32 mmol/L 14(L) - 15(L)  Calcium 8.9 - 10.3 mg/dL 8.5(L) - 9.8   Imaging, labs and test noted above have been reviewed independently by me.  Assessment & Plan:   Altered mental status secondary to hypothermia - improved Acute encephalopathy secondary to cerebral ischemia as a sequelae of SAH New multiple small acute infarcts in left BG, left temporal lobe and bilateral FP 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) - 1/6 developed transient neurological finding with left 3rd nerve palsy, now resolved --Appreciate Neurology assistance --Continue nimodipine --Maintain SBP >140 with continuous IVF --NSG consulted and may plan for arteriogram tomorrow --Stroke following --Minimize sedating drugs  Resolved moderate hypothermia - Found down with T 89.4, no shivering present, decreased alertness and mild bradycardia --Supportive management  Sepsis secondary to unknown source - not likely infection with rapid resolution --DC broad spectrum antibiotics --Monitor WBC and vitals  AKI  - improving --Maintenance IVF --Monitor UOP/Cr --Foley  Alcohol abuse: Currently no signs of withdrawal --Continue thiamine, folate --CIWA protocol with ativan  Chronic thrombocytopenia - resolved No signs of active bleeding  Bipolar disorder --Hold Lamictal and Seroquel for now resume at discharge  Hypothyroidism --Continue home sythroid  Hyperlipidemia --Continue atorvastatin  GERD/ Barrett's esophagus  --Continue Protonix    Best practice (evaluated daily)  Diet: Heart healthy Pain/Anxiety/Delirium protocol (if indicated): Morphine PRN, CIWA ativan VAP protocol (if indicated): NA DVT prophylaxis: SCD GI prophylaxis: PPI Glucose  control: NA Mobility: As tolerated/PT/OT evaluation Disposition: Remain in ICU Communication: Updated sister at bedside on 1/15  Goals of Care:  Last date of multidisciplinary goals of care discussion: 1/6 Family and staff present: Patient and his RN Summary of discussion: Continue aggressive care Follow up goals of care discussion due: 1/13 Code Status: Full code  The patient is critically ill with multiple organ systems failure and requires high complexity decision making for assessment and support, frequent evaluation and titration of therapies, application of advanced monitoring technologies and extensive interpretation of multiple databases.  Independent Critical Care Time: 34 Minutes.   Rodman Pickle, M.D. [facility] Pulmonary/Critical Care Medicine 05/12/2020 10:18 AM   Please see Amion for pager number to reach on-call Pulmonary and Critical Care Team.

## 2020-05-12 NOTE — Progress Notes (Signed)
TCD has been completed.   Preliminary results in CV Proc.   Abram Sander 05/12/2020 11:18 AM

## 2020-05-13 DIAGNOSIS — I609 Nontraumatic subarachnoid hemorrhage, unspecified: Secondary | ICD-10-CM | POA: Diagnosis not present

## 2020-05-13 LAB — LIPID PANEL
Cholesterol: 108 mg/dL (ref 0–200)
HDL: 59 mg/dL (ref >40)
LDL Cholesterol: 23 mg/dL (ref 0–99)
Total CHOL/HDL Ratio: 1.8 RATIO
Triglycerides: 131 mg/dL (ref <150)
VLDL: 26 mg/dL (ref 0–40)

## 2020-05-13 LAB — BASIC METABOLIC PANEL
Anion gap: 15 (ref 5–15)
BUN: 21 mg/dL (ref 8–23)
CO2: 16 mmol/L — ABNORMAL LOW (ref 22–32)
Calcium: 8.8 mg/dL — ABNORMAL LOW (ref 8.9–10.3)
Chloride: 105 mmol/L (ref 98–111)
Creatinine, Ser: 1.56 mg/dL — ABNORMAL HIGH (ref 0.61–1.24)
GFR, Estimated: 47 mL/min — ABNORMAL LOW (ref >60)
Glucose, Bld: 128 mg/dL — ABNORMAL HIGH (ref 70–99)
Potassium: 3.1 mmol/L — ABNORMAL LOW (ref 3.5–5.1)
Sodium: 136 mmol/L (ref 135–145)

## 2020-05-13 LAB — HEMOGLOBIN A1C
Hgb A1c MFr Bld: 3.9 % — ABNORMAL LOW (ref 4.8–5.6)
Mean Plasma Glucose: 65.23 mg/dL

## 2020-05-13 MED ORDER — PANTOPRAZOLE SODIUM 40 MG PO TBEC
40.0000 mg | DELAYED_RELEASE_TABLET | Freq: Every day | ORAL | Status: DC
  Administered 2020-05-13 – 2020-05-16 (×3): 40 mg via ORAL
  Filled 2020-05-13 (×3): qty 1

## 2020-05-13 MED ORDER — DOCUSATE SODIUM 100 MG PO CAPS: 100.0000 mg | ORAL_CAPSULE | Freq: Two times a day (BID) | ORAL | Status: DC | PRN

## 2020-05-13 MED ORDER — FOLIC ACID 1 MG PO TABS
1.0000 mg | ORAL_TABLET | Freq: Every day | ORAL | Status: DC
  Administered 2020-05-13 – 2020-05-16 (×3): 1 mg via ORAL
  Filled 2020-05-13 (×4): qty 1

## 2020-05-13 MED ORDER — ACETAMINOPHEN 325 MG PO TABS
650.0000 mg | ORAL_TABLET | Freq: Four times a day (QID) | ORAL | Status: DC | PRN
  Administered 2020-05-14: 650 mg via ORAL
  Filled 2020-05-13: qty 2

## 2020-05-13 MED ORDER — HYDRALAZINE HCL 25 MG PO TABS
25.0000 mg | ORAL_TABLET | Freq: Four times a day (QID) | ORAL | Status: DC | PRN
Start: 2020-05-13 — End: 2020-05-16

## 2020-05-13 MED ORDER — ADULT MULTIVITAMIN W/MINERALS CH
1.0000 | ORAL_TABLET | Freq: Every day | ORAL | Status: DC
Start: 2020-05-13 — End: 2020-05-16
  Administered 2020-05-13 – 2020-05-16 (×3): 1 via ORAL
  Filled 2020-05-13 (×3): qty 1

## 2020-05-13 MED ORDER — NIMODIPINE 30 MG PO CAPS
60.0000 mg | ORAL_CAPSULE | ORAL | Status: DC
  Administered 2020-05-13 – 2020-05-16 (×20): 60 mg via ORAL
  Filled 2020-05-13 (×21): qty 2

## 2020-05-13 MED ORDER — POLYETHYLENE GLYCOL 3350 17 G PO PACK: 17.0000 g | PACK | Freq: Every day | ORAL | Status: DC | PRN

## 2020-05-13 MED ORDER — LEVOTHYROXINE SODIUM 50 MCG PO TABS
50.0000 ug | ORAL_TABLET | Freq: Every day | ORAL | Status: DC
  Administered 2020-05-14 – 2020-05-16 (×3): 50 ug via ORAL
  Filled 2020-05-13 (×3): qty 1

## 2020-05-13 NOTE — Progress Notes (Addendum)
STROKE TEAM PROGRESS NOTE    Subjective: Patient in bed, NAD. Able to follow commands and answer questions.  Mental status appears alert improved in the last few days. From stroke standpoint okay to transfer to floor today. Some aphasia present.  Problems with Naming, repetition, comprehension.  Transcranial Doppler study shows slight low velocities in the left MCA and basilar but no definite evidence of vasospasm   OBJECTIVE Vitals:   05/13/20 0400 05/13/20 0500 05/13/20 0600 05/13/20 0700  BP: 134/74 (!) 123/112 130/68 115/60  Pulse:  89 79 69  Resp: 13  20 (!) 31  Temp: 99.3 F (37.4 C)     TempSrc: Oral     SpO2: 100% 100% 98% 99%  Weight:  83.1 kg    Height:        CBC:  Recent Labs  Lab 05/11/20 0440 05/11/20 1005 05/12/20 0056  WBC 12.7*  --  17.5*  NEUTROABS 10.8*  --   --   HGB 14.7 11.2* 12.3*  HCT 39.8 33.0* 33.3*  MCV 95.4  --  95.4  PLT 307  --  601    Basic Metabolic Panel:  Recent Labs  Lab 05/10/20 0411 05/11/20 0440 05/11/20 1005 05/12/20 0056  NA 126* 131* 128* 134*  K 3.7 4.3 3.6 3.9  CL 93* 95*  --  105  CO2 21* 15*  --  14*  GLUCOSE 86 146*  --  105*  BUN 16 24*  --  27*  CREATININE 0.97 2.16*  --  2.03*  CALCIUM 9.1 9.8  --  8.5*  MG 2.1 2.7*  --   --     Lipid Panel:     Component Value Date/Time   CHOL 108 05/13/2020 0100   TRIG 131 05/13/2020 0100   HDL 59 05/13/2020 0100   CHOLHDL 1.8 05/13/2020 0100   VLDL 26 05/13/2020 0100   LDLCALC 23 05/13/2020 0100   HgbA1c:  Lab Results  Component Value Date   HGBA1C 3.9 (L) 05/13/2020   Urine Drug Screen:     Component Value Date/Time   LABOPIA NONE DETECTED 05/01/2020 0259   COCAINSCRNUR NONE DETECTED 05/01/2020 0259   COCAINSCRNUR NEG 09/11/2014 1505   LABBENZ NONE DETECTED 05/01/2020 0259   LABBENZ NEG 09/11/2014 1505   AMPHETMU NONE DETECTED 05/01/2020 0259   THCU NONE DETECTED 05/01/2020 0259   LABBARB NONE DETECTED 05/01/2020 0259    Alcohol Level      Component Value Date/Time   ETH 328 (HH) 04/30/2020 2038    IMAGING  CT HEAD WO CONTRAST 04/30/20 IMPRESSION: 1. Moderate to large amount of acute subarachnoid /extra-axial hemorrhage, most heavily concentrated at the suprasellar cistern, and anterior to the brainstem. Findings felt consistent with ruptured aneurysm. Additional foci of extra-axial blood within the posterior fossa, left greater than right middle cranial fossa, and anterior inter hemispheric region. Small amount of bilateral intraventricular and fourth ventricular hemorrhage. Slight interval increase in size/dilatation of lateral and third ventricles compared to previous. 2. Atrophy.  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  CTA HEAD AND NECK 05/01/20 IMPRESSION: 1. Negative intracranial CTA. No visible aneurysm or other vascular abnormality to explain patient's intracranial hemorrhage. 2. No large vessel occlusion. 3. Mild for age atheromatous change about the carotid bifurcations and carotid siphons without hemodynamically significant stenosis. 4. Age-indeterminate compression deformities at the superior endplates of C7, T3, and T4. Correlation with physical exam recommended. Findings could be further assessed with dedicated MRI as clinically warranted.  CT HEAD WO  CONTRAST 05/11/2020 IMPRESSION:  1. Further regression of bilateral extra-axial hemorrhage since 05/06/2020, only small volume residual. Superimposed chronic bilateral subdural hygromas remain stable. Resolved intraventricular blood.  2. No significant intracranial mass effect. No new intracranial abnormality.   MR BRAIN WO CONTRAST 05/11/2020   IMPRESSION:  1. Small acute infarcts in the left basal ganglia, left periatrial temporal lobe and high bilateral frontoparietal region. No substantial associated edema or mass effect.  2. The ventricular system is slightly increased in size compared to the prior MRI with mild convexity of the third ventricle  and rounding of the temporal horns, concerning for developing hydrocephalus  3. When compared to prior MRI, decreased small volume acute/subacute parafalcine subdural hemorrhage and scattered subarachnoid hemorrhage, as detailed above. Similar bilateral subdural hygromas.   DG Elbow 2 Views Right 05/11/2020 IMPRESSION:  Negative.   DG Knee 1-2 Views Right 05/11/2020 IMPRESSION:  1. No acute fracture or dislocation identified about the right knee  2. Advanced calcified peripheral vascular disease.   DG Hand 2 View Right 05/11/2020 IMPRESSION:  1. Difficult to exclude scaphoid fracture on these two views (and lateral view is oblique). Recommend dedicated Four View Right Wrist Series if pain in this region persists.  2. Otherwise no acute fracture or dislocation identified about the right hand.   DG Abd Portable 1V 05/11/2020 IMPRESSION:  Nasogastric tube tip seen in proximal stomach. No evidence of bowel obstruction or ileus.   ECHOCARDIOGRAM COMPLETE 05/11/2020 IMPRESSIONS   1. Left ventricular ejection fraction, by estimation, is 55 to 60%. The left ventricle has normal function. The left ventricle has no regional wall motion abnormalities. There is moderate left ventricular hypertrophy. Left ventricular diastolic parameters are consistent with Grade I diastolic dysfunction (impaired relaxation).   2. Right ventricular systolic function is mildly reduced. The right ventricular size is mildly enlarged.   3. Left atrial size was mildly dilated.   4. The mitral valve is normal in structure. Trivial mitral valve regurgitation. No evidence of mitral stenosis.   5. The aortic valve is tricuspid. Aortic valve regurgitation is mild. Mild aortic valve stenosis.   6. Aortic dilatation noted. There is mild dilatation of the aortic root, measuring 41 mm.   7. The inferior vena cava is normal in size with greater than 50% respiratory variability, suggesting right atrial pressure of 3 mmHg.    EEG 05/11/2020 ABNORMALITY - Continuous slow, generalized  IMPRESSION:  This study is suggestive of moderate diffuse encephalopathy, nonspecific etiology. No seizures or epileptiform discharges were seen throughout the recording.  Transcranial Doppler study 05/12/2020: Slightly low velocities in left MCA and basilar artery suggestive of mild distal stenosis but no definite evidence of vasospasm noted  ECG - SR rate 72 BPM. Anteroseptal infarct age undetermined. (See cardiology reading for complete details)  PHYSICAL EXAM Blood pressure 115/60, pulse 69, temperature 99.3 F (37.4 C), temperature source Oral, resp. rate (!) 31, height 6\' 1"  (1.854 m), weight 83.1 kg, SpO2 99 %.  Pleasant middle-age Caucasian male or in distress . Afebrile. Head is nontraumatic. Neck is supple without bruit.    Cardiac exam no murmur or gallop. Lungs are clear to auscultation. Distal pulses are well felt.  Neurological Exam : Awake alert oriented to place only.  Diminished attention, registration and recall.     Follows simple midline and one-step commands.  Speech is nonfluent and hesitant with occasional word finding difficulty with good comprehension and able to name and repeat well.  Extraocular movements are full range without nystagmus.  Blinks to threat bilaterally.  Face is symmetric without weakness.  Tongue midline.  Motor system exam no upper or lower extremity drift symmetric and equal strength in all 4 extremities.  No facial mild action tremor of both outstretched upper extremities.  Deep tendon reflexes symmetric.  Plantars downgoing.  Withdraws to painful stimuli equally in all 4 extremities.  Gait not tested.  ASSESSMENT/PLAN Jeffrey Acosta is a 70 y.o. male with history of ETOH and tobacco abuse, bipolar disorder, atrial tachycardia, migraines, hypothyroidism, diverticulosis, GERD, hyperlipidemia, osteopenia, and polyarthralgia who was admitted to [facility]/30/20 with a large SAH. Neurosurgery was  consulted and no surgery was felt to be indicated at that time. On 05/10/20, while still an inpatient, the family noted aphasia, inattention to conversation, and decreased responsiveness. Yesterday, 05/11/20, the pt. apparently left his hospital room to have a cigarette and was found down, outside, hypothermic and unresponsive. He was transferred to the NICU. He did not receive IV t-PA due to [place].  Stroke: [facility] - acute subarachnoid /extra-axial hemorrhage, most heavily concentrated at the suprasellar cistern, and anterior to the brainstem - possibly 2/2 nonaneurysmal perimesencephalic hemorrhage  Resultant encephalopathy and mild nonfluent speech  Code Stroke CT Head - not ordered  CT head 04/30/20 - Moderate to large amount of acute subarachnoid /extra-axial hemorrhage, most heavily concentrated at the suprasellar cistern, and anterior to the brainstem. Findings felt consistent with ruptured aneurysm.  CT head 05/11/20 - Further regression of bilateral extra-axial hemorrhage since 05/06/2020, only small volume residual. Superimposed chronic bilateral subdural hygromas remain stable. Resolved intraventricular blood.   MRI head - Small acute infarcts in the left basal ganglia, left periatrial temporal lobe and high bilateral frontoparietal region. No substantial associated edema or mass effect. The ventricular system is slightly increased in size compared to the prior MRI with mild convexity of the third ventricle and rounding of the temporal horns, concerning for developing hydrocephalus. When compared to prior MRI, decreased small volume acute/subacute parafalcine subdural hemorrhage and scattered subarachnoid hemorrhage, as detailed above. Similar bilateral subdural hygromas.   MRA head - not ordered  CTA H&N -  Negative intracranial CTA. No visible aneurysm or other vascular abnormality to explain patient's intracranial hemorrhage. No large vessel occlusion. Mild for age atheromatous change about the  carotid bifurcations and carotid siphons without hemodynamically significant stenosis. Age-indeterminate compression deformities at the superior endplates of C7, T3, and T4. Correlation with physical exam recommended. Findings could be further assessed with dedicated MRI as clinically warranted.  CT Perfusion - not ordered  Carotid Doppler - CTA neck ordered - carotid dopplers not indicated.  EEG - moderate diffuse encephalopathy, nonspecific etiology. No seizures or epileptiform discharges were seen throughout the recording.  2D Echo - EF 55 - 60%. No cardiac source of emboli identified.   Sars Corona Virus 2 - negative  LDL - pending (41 OV 03/14/20)   HgbA1c - pending  UDS - negative  ETOH - 328 on 04/30/20  VTE prophylaxis - SCDs Diet  Diet Order            Diet NPO time specified  Diet effective now           Diet - low sodium heart healthy                 aspirin 81 mg daily prior to admission, now on No antithrombotic  Ongoing aggressive stroke risk factor management  Therapy recommendations:  SNF  Disposition:  Pending  Hypertension  Home  BP meds: Toprol XL 100 mg daily  Current BP meds: Apresoline 25 mg Q 6 hrs ; Nimodipine 60 mg Q 4 hrs  Stable  Systolic blood pressure goal < 160 mm Hg  Long-term BP goal normotensive  Hyperlipidemia  Home Lipid lowering medication: Lipitor 20 mg daily  LDL 41 (OV 03/14/20), goal < 70  Current lipid lowering medication: Lipitor 20 mg daily -> will D/C - (statin contraindicated with ICH)   Other Stroke Risk Factors  Advanced age  Cigarette smoker - advised to stop smoking  Previous ETOH abuse  Family hx stroke (grandparents)   Migraines  Other Active Problems, Findings, Recommendations and/or Plan  Code status - Full code  Sepsis - Zosyn started 1/15 ; Vancomycin added 1/16  Leukocytosis - WBC's - 12.7->17.5  T max - 99.3  Vasospasm prophylaxis - nimodipine 60 mg Q 4 hrs  Will check lipids and  HgbA1c   Advanced calcified peripheral vascular disease by imaging  AKI - creatinine - 0.97->2.16->2.03    Hyponatremia - Na - 126->131->128->134     Possible Rt wrist fx by imaging - may need f/u (see above)  Hospital day # 13 Discussed with Dr. Kathyrn Sheriff patient will have diagnosis of a catheter angiogram tomorrow.  Continue nimodipine and if vasospasm is not found we discontinue it.  Consider transfer to neurology floor bed.  Discussed with Dr. Loanne Drilling critical care medicine.  Greater than 50% time during this 35-minute visit was spent on counseling and coordination of care about his strokes and intracerebral hemorrhage and discussion with care team and answering questions. Antony Contras, MD,

## 2020-05-13 NOTE — Progress Notes (Signed)
Patient ID: Jeffrey Acosta, male   DOB: May 26, 1950, 70 y.o.   MRN: [id number] Charted events of this weekend noted and reviewed.  Patient's wife is at bedside and expressed to me what she has been told to happen on early Saturday morning.  At this time patient still has some mild aphasia that I do not recall from my previous conversations.  He did not have any severe word finding difficulties previously and now appears to be struggling with this to a moderate degree.  Nonetheless his wife notes that he is functioning much better than he had been yesterday.  I have advised that he should proceed with catheter angiography to rule out the possibility of an aneurysm though I suspect the likelihood of this is low.

## 2020-05-13 NOTE — Progress Notes (Signed)
Physical Therapy Treatment Patient Details Name: Jeffrey Acosta MRN: [id number] DOB: 07/15/1950 Today's Date: 05/13/2020    History of Present Illness 70 yo male presenting from home after a fall at home. 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 PMH includes: alcohol use, bipolar affective disorder,  hypothyroidism, atrial tachycardia.    PT Comments    Pt progressing well with mobility, ambulating hallway distance with use of RW and min assist for steadying. Pt with poor standing balance, requiring multimodal cues to avoid falls, maintain straight walking trajectory in hallway. Pt with x2 LOB requiring PT correction. Pt's wife present at bedside, very supportive. PT to continue to follow acutely, SNF remains appropriate dispo given high fall risk, decreased activity tolerance, and poor safety awareness.     Follow Up Recommendations  SNF;Supervision/Assistance - 24 hour     Equipment Recommendations  Rolling walker with 5" wheels;3in1 (PT)    Recommendations for Other Services       Precautions / Restrictions Precautions Precautions: Fall;Other (comment) Precaution Comments: monitor BP Restrictions Weight Bearing Restrictions: No    Mobility  Bed Mobility Overal bed mobility: Needs Assistance Bed Mobility: Supine to Sit;Sit to Supine     Supine to sit: Min guard Sit to supine: Min guard   General bed mobility comments: for safety, increased time and effort  Transfers Overall transfer level: Needs assistance Equipment used: Rolling walker (2 wheeled) Transfers: Sit to/from Stand Sit to Stand: Min assist         General transfer comment: min assist to steady once standing, VC for hand placement  Ambulation/Gait Ambulation/Gait assistance: Min assist Gait Distance (Feet): 200 Feet (+100) Assistive device: Rolling walker (2 wheeled) Gait Pattern/deviations: Step-through pattern;Decreased stride length;Trunk  flexed;Staggering left Gait velocity: decr   General Gait Details: Min assist to steady especially during x2 LOB when changing directions/head turns. Verbal cuing for upright posture, positioning in middle of RW, maintaining straight gait trajectory as pt often listing L.   Stairs             Wheelchair Mobility    Modified Rankin (Stroke Patients Only) Modified Rankin (Stroke Patients Only) Pre-Morbid Rankin Score: No symptoms Modified Rankin: Moderately severe disability     Balance Overall balance assessment: Needs assistance Sitting-balance support: Feet supported;Single extremity supported;No upper extremity supported Sitting balance-Leahy Scale: Fair     Standing balance support: Single extremity supported;Bilateral upper extremity supported Standing balance-Leahy Scale: Poor Standing balance comment: reliant on external assist dynamically                            Cognition Arousal/Alertness: Awake/alert Behavior During Therapy: Flat affect;Impulsive Overall Cognitive Status: Impaired/Different from baseline Area of Impairment: Attention;Memory;Following commands;Safety/judgement;Awareness;Problem solving                   Current Attention Level: Sustained Memory: Decreased recall of precautions;Decreased short-term memory Following Commands: Follows one step commands with increased time Safety/Judgement: Decreased awareness of safety;Decreased awareness of deficits Awareness: Intellectual Problem Solving: Slow processing;Decreased initiation;Requires verbal cues;Requires tactile cues;Difficulty sequencing General Comments: Pt requiring repeated cues to wait for PT assist before mobilizing, attempting to get to EOB when tangled in lines multiple times. Poor awareness of balance deficits      Exercises      General Comments        Pertinent Vitals/Pain Pain Assessment: Faces Faces Pain Scale: No hurt Pain Intervention(s): Limited  activity within patient's tolerance;Monitored during session    Home Living                      Prior Function            PT Goals (current goals can now be found in the care plan section) Acute Rehab PT Goals PT Goal Formulation: With patient Time For Goal Achievement: 05/16/20 Potential to Achieve Goals: Good Progress towards PT goals: Progressing toward goals    Frequency    Min 4X/week (may progress to home)      PT Plan Current plan remains appropriate    Co-evaluation              AM-PAC PT "6 Clicks" Mobility   Outcome Measure  Help needed turning from your back to your side while in a flat bed without using bedrails?: A Little Help needed moving from lying on your back to sitting on the side of a flat bed without using bedrails?: A Little Help needed moving to and from a bed to a chair (including a wheelchair)?: A Little Help needed standing up from a chair using your arms (e.g., wheelchair or bedside chair)?: A Little Help needed to walk in hospital room?: A Little Help needed climbing 3-5 steps with a railing? : A Lot 6 Click Score: 17    End of Session Equipment Utilized During Treatment: Gait belt Activity Tolerance: Patient tolerated treatment well Patient left: with family/visitor present;in bed;with bed alarm set;with restraints reapplied (waist restraint) Nurse Communication: Mobility status PT Visit Diagnosis: Unsteadiness on feet (R26.81);Other abnormalities of gait and mobility (R26.89)     Time: 7106-2694 PT Time Calculation (min) (ACUTE ONLY): 25 min  Charges:  $Gait Training: 8-22 mins $Therapeutic Activity: 8-22 mins                     Stacie Glaze, PT Acute Rehabilitation Services Pager [phone]  Office [phone]  Roxine Caddy E Ruffin Pyo 05/13/2020, 3:21 PM

## 2020-05-13 NOTE — Progress Notes (Signed)
NAME:  Jeffrey Acosta, MRN:  [id number], DOB:  [birth year], LOS: 43 ADMISSION DATE:  04/30/2020, CONSULTATION DATE:  1/4 REFERRING MD:  Dr. Billy Fischer, CHIEF COMPLAINT:  SAH   Brief History:  70 year old male admitted 1/4 with SAH after collapsing at home with loss of consciousness.  Found to have subarachnoid hemorrhage, CT angios negative for aneurysm or AV malformation.    On 1/15 patient wandered off medical floor and found down and hypothermic outside. Admitted to ICU for hypothermia and found with new strokes  Past Medical History:   has a past medical history of Alcoholism in remission ([place]), Arrhythmia, Bipolar affective disorder ([place]), Diverticulosis of colon (without mention of hemorrhage), GERD (gastroesophageal reflux disease), Gout of multiple sites, Hiatal hernia, migraines, Hydrocele, HYPERLIPIDEMIA (01/10/2009), Hypothyroidism, Melanoma of skin, site unspecified (11/25/2012), Osteopenia, and Polyarthralgia.   Significant Hospital Events:  1/4 admit 1/6 complained of inability to open left eye, pupil was dilated and minimally reactive associated with left III n palsy.  Stat head CT was done which showed no new changes  1/15 patient wandered off medical floor and found down and hypothermic outside. Admitted to ICU for hypothermia 1/16 New strokes 1/17 Plan for arteriogram with NSG  Consults:  Neurosurgery  Procedures:    Significant Diagnostic Tests:  CT head 1/4 > Moderate to large amount of acute subarachnoid /extra-axial hemorrhage, most heavily concentrated at the suprasellar cistern, and anterior to the brainstem. Findings felt consistent with ruptured aneurysm. Additional foci of extra-axial blood within the posterior fossa, left greater than right middle cranial fossa, and anterior inter hemispheric region. Small amount of bilateral intraventricular and fourth ventricular hemorrhage. Suspected left anterior subdural hygroma as well.  CT C-spine 1/4 > multilevel  cervical spondylosis. No acute fracture.  CTA head/neck 1/4 > negative for aneurysm or other vascular abnormality.  No LVO.  Age indeterminate compression deformities at the superior endplates of C7, T3, T4.  CT head 1/6: Unchanged subarachnoid hemorrhage. Minimal intraventricular reflux. The ventricles measure a few millimeters larger than yesterday, but no ventriculomegaly  CT Head 05/12/19 Regression of bilateral extra-axial hemorrhage since 1/10. Resolved IVH  MR Brain 05/13/19  Small acute infarcts in the left basal ganglia, left periatrial temporal lobe and high bilateral frontoparietal region. Ventricular system slight increased concerning for developing hydrocephalus. Decreased small volume acute/subacute parafalcine subdural hemorrhage and scattered subarachnoid hemorrhage   Micro Data:  COVID 1/4 > neg. Flu 1/4 > neg. MRSA PCR 1/6 > negative  Antimicrobials:     Interim History / Subjective:   This morning awake, answers questions and follows commands. Remains encephalopathic and not oriented.  Objective   Blood pressure 138/77, pulse 77, temperature 99.3 F (37.4 C), temperature source Oral, resp. rate 18, height 6\' 1"  (1.854 m), weight 83.1 kg, SpO2 100 %.        Intake/Output Summary (Last 24 hours) at 05/13/2020 0922 Last data filed at 05/13/2020 0800 Gross per 24 hour  Intake 875.96 ml  Output 1700 ml  Net -824.04 ml   Filed Weights   05/10/20 0311 05/11/20 0500 05/13/20 0500  Weight: 88.3 kg 83 kg 83.1 kg   Physical Exam: General: Chronically ill-appearing, no acute distress HENT: [place], AT, OP clear, MMM Eyes: EOMI, no scleral icterus Respiratory: Clear to auscultation bilaterally.  No crackles, wheezing or rales Cardiovascular: RRR, -M/R/G, no JVD GI: BS+, soft, nontender Extremities:-Edema,-tenderness Neuro: Encephalopathic, follows commands, moves extremities, oriented to self, mild expressive aphasia   CBC Latest Ref Rng &  Units 05/12/2020 05/11/2020  05/11/2020  WBC 4.0 - 10.5 K/uL 17.5(H) - 12.7(H)  Hemoglobin 13.0 - 17.0 g/dL 12.3(L) 11.2(L) 14.7  Hematocrit 39.0 - 52.0 % 33.3(L) 33.0(L) 39.8  Platelets 150 - 400 K/uL 273 - 307   BMP Latest Ref Rng & Units 05/12/2020 05/11/2020 05/11/2020  Glucose 70 - 99 mg/dL 105(H) - 146(H)  BUN 8 - 23 mg/dL 27(H) - 24(H)  Creatinine 0.61 - 1.24 mg/dL 2.03(H) - 2.16(H)  Sodium 135 - 145 mmol/L 134(L) 128(L) 131(L)  Potassium 3.5 - 5.1 mmol/L 3.9 3.6 4.3  Chloride 98 - 111 mmol/L 105 - 95(L)  CO2 22 - 32 mmol/L 14(L) - 15(L)  Calcium 8.9 - 10.3 mg/dL 8.5(L) - 9.8   Imaging, labs and test noted above have been reviewed independently by me.  Assessment & Plan:   Altered mental status secondary to hypothermia - improved Acute encephalopathy secondary to cerebral ischemia as a sequelae of SAH New multiple small acute infarcts in left BG, left temporal lobe and bilateral FP 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) - 1/6 developed transient neurological finding with left 3rd nerve palsy, now resolved --Appreciate Neurology assistance --Continue nimodipine --Maintain SBP >140 with continuous IVF --NSG consulted and plan for angiogram --Stroke following --Minimize sedating drugs  Resolved moderate hypothermia - Found down with T 89.4, no shivering present, decreased alertness and mild bradycardia --Supportive management  AKI  - improving --Repeat BMP --Maintenance IVF --Monitor UOP/Cr --Foley  Alcohol abuse: Currently no signs of withdrawal --Continue thiamine, folate --CIWA protocol with ativan  Chronic thrombocytopenia - resolved No signs of active bleeding  Bipolar disorder --Hold Lamictal and Seroquel for now resume at discharge  Hypothyroidism --Continue home sythroid  Hyperlipidemia --Continue atorvastatin  GERD/ Barrett's esophagus  --Continue Protonix    Best practice (evaluated daily)  Diet: Heart healthy Pain/Anxiety/Delirium protocol (if  indicated): Morphine PRN, CIWA ativan VAP protocol (if indicated): NA DVT prophylaxis: SCD GI prophylaxis: PPI Glucose control: NA Mobility: As tolerated/PT/OT evaluation Disposition: Remain in ICU Communication: Updated wife, [name], via phone on 1/17  Goals of Care:  Last date of multidisciplinary goals of care discussion: 1/6 Family and staff present: Patient and his RN Summary of discussion: Continue aggressive care Follow up goals of care discussion due: 1/13 Code Status: Full code  The patient is critically ill with multiple organ systems failure and requires high complexity decision making for assessment and support, frequent evaluation and titration of therapies, application of advanced monitoring technologies and extensive interpretation of multiple databases.  Independent Critical Care Time: 31 Minutes.   Rodman Pickle, M.D. [facility] Pulmonary/Critical Care Medicine 05/13/2020 9:22 AM   Please see Amion for pager number to reach on-call Pulmonary and Critical Care Team.

## 2020-05-14 ENCOUNTER — Inpatient Hospital Stay (HOSPITAL_COMMUNITY): Payer: Medicare Other

## 2020-05-14 DIAGNOSIS — I10 Essential (primary) hypertension: Secondary | ICD-10-CM | POA: Diagnosis not present

## 2020-05-14 DIAGNOSIS — I609 Nontraumatic subarachnoid hemorrhage, unspecified: Secondary | ICD-10-CM | POA: Diagnosis not present

## 2020-05-14 DIAGNOSIS — F1010 Alcohol abuse, uncomplicated: Secondary | ICD-10-CM | POA: Diagnosis not present

## 2020-05-14 HISTORY — PX: IR ANGIO VERTEBRAL SEL VERTEBRAL BILAT MOD SED: IMG5369

## 2020-05-14 HISTORY — PX: IR ANGIO INTRA EXTRACRAN SEL INTERNAL CAROTID BILAT MOD SED: IMG5363

## 2020-05-14 LAB — BASIC METABOLIC PANEL
Anion gap: 12 (ref 5–15)
BUN: 11 mg/dL (ref 8–23)
CO2: 18 mmol/L — ABNORMAL LOW (ref 22–32)
Calcium: 8.7 mg/dL — ABNORMAL LOW (ref 8.9–10.3)
Chloride: 105 mmol/L (ref 98–111)
Creatinine, Ser: 1.08 mg/dL (ref 0.61–1.24)
GFR, Estimated: >60 mL/min (ref >60)
Glucose, Bld: 93 mg/dL (ref 70–99)
Potassium: 2.8 mmol/L — ABNORMAL LOW (ref 3.5–5.1)
Sodium: 135 mmol/L (ref 135–145)

## 2020-05-14 LAB — MAGNESIUM: Magnesium: 1.5 mg/dL — ABNORMAL LOW (ref 1.7–2.4)

## 2020-05-14 LAB — CBC
HCT: 35.2 % — ABNORMAL LOW (ref 39.0–52.0)
Hemoglobin: 13 g/dL (ref 13.0–17.0)
MCH: 35.3 pg — ABNORMAL HIGH (ref 26.0–34.0)
MCHC: 36.9 g/dL — ABNORMAL HIGH (ref 30.0–36.0)
MCV: 95.7 fL (ref 80.0–100.0)
Platelets: 292 10*3/uL (ref 150–400)
RBC: 3.68 MIL/uL — ABNORMAL LOW (ref 4.22–5.81)
RDW: 12.7 % (ref 11.5–15.5)
WBC: 11.4 10*3/uL — ABNORMAL HIGH (ref 4.0–10.5)
nRBC: 0 % (ref 0.0–0.2)

## 2020-05-14 LAB — PHOSPHORUS: Phosphorus: 2.6 mg/dL (ref 2.5–4.6)

## 2020-05-14 MED ORDER — POTASSIUM CHLORIDE 10 MEQ/100ML IV SOLN
10.0000 meq | INTRAVENOUS | Status: AC
  Administered 2020-05-14 (×8): 10 meq via INTRAVENOUS
  Filled 2020-05-14 (×4): qty 100

## 2020-05-14 MED ORDER — DEXAMETHASONE SODIUM PHOSPHATE 10 MG/ML IJ SOLN: 10.0000 mg | Freq: Once | INTRAMUSCULAR | Status: AC

## 2020-05-14 MED ORDER — LIDOCAINE HCL 1 % IJ SOLN
INTRAMUSCULAR | Status: AC
  Filled 2020-05-14: qty 20

## 2020-05-14 MED ORDER — DIPHENHYDRAMINE HCL 50 MG/ML IJ SOLN: 50.0000 mg | Freq: Once | INTRAMUSCULAR | Status: AC

## 2020-05-14 MED ORDER — HEPARIN SODIUM (PORCINE) 1000 UNIT/ML IJ SOLN
INTRAMUSCULAR | Status: AC
  Filled 2020-05-14: qty 2

## 2020-05-14 MED ORDER — FAMOTIDINE 20 MG PO TABS
40.0000 mg | ORAL_TABLET | Freq: Once | ORAL | Status: AC
  Administered 2020-05-14: 40 mg via ORAL

## 2020-05-14 MED ORDER — DIPHENHYDRAMINE HCL 50 MG/ML IJ SOLN
INTRAMUSCULAR | Status: AC
  Administered 2020-05-14: 50 mg
  Filled 2020-05-14: qty 1

## 2020-05-14 MED ORDER — MIDAZOLAM HCL 2 MG/2ML IJ SOLN
INTRAMUSCULAR | Status: AC
  Filled 2020-05-14: qty 2

## 2020-05-14 MED ORDER — FENTANYL CITRATE (PF) 100 MCG/2ML IJ SOLN
INTRAMUSCULAR | Status: AC | PRN
  Administered 2020-05-14: 25 ug via INTRAVENOUS

## 2020-05-14 MED ORDER — MIDAZOLAM HCL 2 MG/2ML IJ SOLN
INTRAMUSCULAR | Status: AC | PRN
  Administered 2020-05-14: 1 mg via INTRAVENOUS

## 2020-05-14 MED ORDER — DEXAMETHASONE SODIUM PHOSPHATE 10 MG/ML IJ SOLN
10.0000 mg | Freq: Once | INTRAMUSCULAR | Status: AC
  Administered 2020-05-14: 10 mg via INTRAVENOUS
  Filled 2020-05-14: qty 1

## 2020-05-14 MED ORDER — SODIUM BICARBONATE 650 MG PO TABS
650.0000 mg | ORAL_TABLET | Freq: Two times a day (BID) | ORAL | Status: AC
  Administered 2020-05-14 – 2020-05-15 (×3): 650 mg via ORAL
  Filled 2020-05-14 (×4): qty 1

## 2020-05-14 MED ORDER — MAGNESIUM SULFATE 2 GM/50ML IV SOLN
2.0000 g | Freq: Once | INTRAVENOUS | Status: AC
  Administered 2020-05-14: 2 g via INTRAVENOUS
  Filled 2020-05-14: qty 50

## 2020-05-14 MED ORDER — HEPARIN SODIUM (PORCINE) 1000 UNIT/ML IJ SOLN
INTRAMUSCULAR | Status: AC | PRN
Start: 2020-05-14 — End: 2020-05-14
  Administered 2020-05-14: 2000 [IU] via INTRAVENOUS

## 2020-05-14 MED ORDER — FENTANYL CITRATE (PF) 100 MCG/2ML IJ SOLN
INTRAMUSCULAR | Status: AC
  Filled 2020-05-14: qty 2

## 2020-05-14 MED ORDER — DEXAMETHASONE SODIUM PHOSPHATE 10 MG/ML IJ SOLN
INTRAMUSCULAR | Status: AC
  Administered 2020-05-14: 10 mg
  Filled 2020-05-14: qty 1

## 2020-05-14 NOTE — Progress Notes (Signed)
[facility] ADULT ICU REPLACEMENT PROTOCOL   The patient does apply for the [facility] Adult ICU Electrolyte Replacment Protocol based on the criteria listed below:   1. Is GFR >/= 30 ml/min? Yes.    Patient's GFR today is >60 2. Is SCr </= 2? Yes.   Patient's SCr is 1.08 ml/kg/hr 3. Did SCr increase >/= 0.5 in 24 hours? No. 4. Abnormal electrolyte(s): K 2.8 5. Ordered repletion with: protocol 6. If a panic level lab has been reported, has the CCM MD in charge been notified? Yes.  .   Physician:  E. Margy Clarks Kendelle Schweers 05/14/2020 5:23 AM

## 2020-05-14 NOTE — Progress Notes (Signed)
PT Cancellation Note  Patient Details Name: Jeffrey Acosta MRN: [id number] DOB: 08-28-1950   Cancelled Treatment:    Reason Eval/Treat Not Completed: Patient not medically ready;Active bedrest order - femoral sheath removed s/p angiogram, Pt on bedrest. PT to check back tomorrow.   Stacie Glaze, PT Acute Rehabilitation Services Pager [phone]  Office [phone]    Louis Matte 05/14/2020, 3:58 PM

## 2020-05-14 NOTE — Sedation Documentation (Signed)
Right femoral sheath removed. 5 Fr. Exoseal deployed

## 2020-05-14 NOTE — Progress Notes (Signed)
  NEUROSURGERY PROGRESS NOTE   No issues overnight. Pt without complaint this am.  EXAM:  BP 123/79   Pulse 85   Temp 97.6 F (36.4 C) (Oral)   Resp (!) 22   Ht 6\' 1"  (1.854 m)   Wt 83 kg   SpO2 99%   BMI 24.14 kg/m   Awake, alert, oriented  Speech fluent, appropriate  Naming and repetition intact CN grossly intact  5/5 BUE/BLE   IMPRESSION:  70 y.o. male with SAH, initial CTA negative developed mild expressive aphasia over weekend, significantly improved today although not quite normal.  PLAN: - Will proceed with diagnostic angiogram today  I have reviewed the indications for the angiogram with the patient and his wife. Risks of the procedure including stroke, dissection, hematoma, and contrast reaction/nephropathy were discussed. All questions were answered and he provided consent to proceed.   Consuella Lose, MD [facility]

## 2020-05-14 NOTE — Progress Notes (Signed)
Patient ID: Jeffrey Acosta, male   DOB: [birth year], 70 y.o.   MRN: [id number] Vital signs are stable Patient is ready for angiography today Spoke briefly with the patient's wife His speech is improving steadily Continue to monitor.

## 2020-05-14 NOTE — Progress Notes (Signed)
Progress Note    Jeffrey Acosta  XTK:[id number] DOB: 03-29-1951  DOA: 04/30/2020 PCP: Colon Branch, MD      Brief Narrative:    Medical records reviewed and are as summarized below:  Jeffrey Acosta is a 70 y.o. male with medical history significant for alcohol use disorder, tobacco use disorder, atrial tachycardia, migraine, hyperlipidemia, bipolar disorder, hypothyroidism, melanoma, polyarthralgia, colonic diverticulosis, GERD, gout, was admitted to the hospital on 04/30/2020 after he collapsed at home with loss of consciousness.  He was found to have subarachnoid hemorrhage.  Neurosurgery was consulted and no surgery was indicated per neurosurgeon.  Reportedly, he left his hospital room on 05/11/2020 to go and smoke a cigarette outside.  Unfortunately, he was found down, outside of the hospital.  He was hypothermic and unresponsive.  He was transferred to the neuro ICU.  MRI brain showed small acute infarcts in the left basal ganglia, left periatrial temporal lobe and high bilateral frontoparietal regions.  He underwent diagnostic cerebral angiogram on 05/14/2020.   No intracranial aneurysms, AVMs or fistulas were seen but there was moderate to severe vasospasm of bilateral anterior circulation, and mild spasm of the left PCA territory.  Case was discussed with neurologist, Dr. Leonie Man, recommended 21-day course of pneumonia pain for vasospasms.    Assessment/Plan:   Principal Problem:   Subarachnoid hemorrhage (HCC) Active Problems:   Alcohol abuse   Bipolar I disorder (HCC)   Hypothyroidism   Benign essential HTN   Body mass index is 24.14 kg/m.    Acute stroke (Subarachnoid hemorrhage with vasospasm, small acute infarcts in the left basal ganglia, left periatrial temporal lobe and right bilateral frontoparietal regions): s/p cerebral angiogram on 05/14/2020. Continue nimodipine for total of 21 days. EEG showed moderately diffuse encephalopathy but no seizures or epileptiform  discharges were noted.  Hypothermia induced altered mental status: Resolved   Hypertension: Continue antihypertensives  Metabolic acidosis: Treat with sodium bicarbonate.  AKI, resolved  Hyponatremia: Improved  Hypokalemia and hypomagnesemia: Replete potassium and magnesium and monitor levels.   Alcohol use disorder: Continue thiamine and folic acid.  Ativan as needed for withdrawal syndrome.  Thrombocytopenia: resolved  Other comorbidities include hypothyroidism, bipolar disorder, GERD/Barrett's esophagus  PT and OT  recommended discharge to SNF.  Follow-up with social worker to assist with disposition.    Diet Order            Diet - low sodium heart healthy                    Consultants:  Neurologist  Intensivist  Neurosurgeon  Procedures:  Cerebral angiogram on 05/14/2020    Medications:   . Chlorhexidine Gluconate Cloth  6 each Topical Daily  . dexamethasone (DECADRON) injection  10 mg Intravenous Once  . fentaNYL      . folic acid  1 mg Oral Daily  . heparin sodium (porcine)      . levothyroxine  50 mcg Oral QAC breakfast  . lidocaine      . midazolam      . multivitamin with minerals  1 tablet Oral Daily  . niMODipine  60 mg Oral Q4H  . pantoprazole  40 mg Oral Daily  . sodium bicarbonate  650 mg Oral BID   Continuous Infusions: . sodium chloride Stopped (05/12/20 1641)  . thiamine injection       Anti-infectives (From admission, onward)   Start     Dose/Rate Route Frequency Ordered Stop  05/12/20 1100  vancomycin (VANCOCIN) IVPB 1000 mg/200 mL premix  Status:  Discontinued        1,000 mg 200 mL/hr over 60 Minutes Intravenous Every 24 hours 05/11/20 1222 05/12/20 1027   05/11/20 1800  piperacillin-tazobactam (ZOSYN) IVPB 3.375 g  Status:  Discontinued       Note to Pharmacy: Pharmacy may modify dose as appropriate.   3.375 g 12.5 mL/hr over 240 Minutes Intravenous Every 8 hours 05/11/20 1222 05/12/20 1027   05/11/20 0800   vancomycin (VANCOCIN) IVPB 1000 mg/200 mL premix  Status:  Discontinued        1,000 mg 200 mL/hr over 60 Minutes Intravenous Every 24 hours 05/11/20 0701 05/11/20 1222   05/11/20 0730  piperacillin-tazobactam (ZOSYN) IVPB 3.375 g  Status:  Discontinued       Note to Pharmacy: Pharmacy may modify dose as appropriate.   3.375 g 12.5 mL/hr over 240 Minutes Intravenous Every 8 hours 05/11/20 1884 05/11/20 1222             Family Communication/Anticipated D/C date and plan/Code Status   DVT prophylaxis: SCDs Start: 04/30/20 2354     Code Status: Full Code  Family Communication: None Disposition Plan:    Status is: Inpatient  Remains inpatient appropriate because:Unsafe d/c plan   Dispo:  Patient From: Home  Planned Disposition: [place]  Expected discharge date: 05/15/2020  Medically stable for discharge: Yes            Subjective:   Interval events noted.  He has no complaints.  He was seen at the bedside with the neurologist and his nurse.  History obtained from the patient, neurologist and chart review.  Objective:    Vitals:   05/14/20 1405 05/14/20 1410 05/14/20 1415 05/14/20 1430  BP: (!) 156/86 (!) 151/82 (!) 150/84 (!) 146/93  Pulse: 75 75 75 80  Resp: 19 19 (!) 27 (!) 23  Temp:      TempSrc:      SpO2: 100% 100% 100% 100%  Weight:      Height:       No data found.   Intake/Output Summary (Last 24 hours) at 05/14/2020 1615 Last data filed at 05/14/2020 1100 Gross per 24 hour  Intake 477.03 ml  Output 1800 ml  Net -1322.97 ml   Filed Weights   05/11/20 0500 05/13/20 0500 05/14/20 0500  Weight: 83 kg 83.1 kg 83 kg    Exam:  GEN: NAD SKIN: Warm and dry EYES: EOMI ENT: MMM CV: RRR PULM: CTA B ABD: soft, ND, NT, +BS CNS: AAO x 3, non focal EXT: No edema or tenderness   Data Reviewed:   I have personally reviewed following labs and imaging studies:  Labs: Labs show the following:   Basic Metabolic  Panel: Recent Labs  Lab 05/08/20 0345 05/09/20 0252 05/10/20 0411 05/11/20 0440 05/11/20 1005 05/12/20 0056 05/13/20 0100 05/14/20 0340  NA 129*   < > 126* 131* 128* 134* 136 135  K 3.4*   < > 3.7 4.3 3.6 3.9 3.1* 2.8*  CL 95*   < > 93* 95*  --  105 105 105  CO2 23   < > 21* 15*  --  14* 16* 18*  GLUCOSE 110*   < > 86 146*  --  105* 128* 93  BUN 16   < > 16 24*  --  27* 21 11  CREATININE 0.99   < > 0.97 2.16*  --  2.03* 1.56*  1.08  CALCIUM 8.3*   < > 9.1 9.8  --  8.5* 8.8* 8.7*  MG 1.8  --  2.1 2.7*  --   --   --  1.5*  PHOS  --   --   --   --   --   --   --  2.6   < > = values in this interval not displayed.   GFR Estimated Creatinine Clearance: 70.9 mL/min (by C-G formula based on SCr of 1.08 mg/dL). Liver Function Tests: Recent Labs  Lab 05/11/20 0440  AST 35  ALT 25  ALKPHOS 67  BILITOT 2.0*  PROT 6.4*  ALBUMIN 3.9   No results for input(s): LIPASE, AMYLASE in the last 168 hours. No results for input(s): AMMONIA in the last 168 hours. Coagulation profile No results for input(s): INR, PROTIME in the last 168 hours.  CBC: Recent Labs  Lab 05/10/20 0411 05/11/20 0440 05/11/20 1005 05/12/20 0056 05/14/20 0340  WBC 8.5 12.7*  --  17.5* 11.4*  NEUTROABS  --  10.8*  --   --   --   HGB 14.6 14.7 11.2* 12.3* 13.0  HCT 39.5 39.8 33.0* 33.3* 35.2*  MCV 95.2 95.4  --  95.4 95.7  PLT 225 307  --  273 292   Cardiac Enzymes: Recent Labs  Lab 05/11/20 0556  CKTOTAL 111   BNP (last 3 results) No results for input(s): PROBNP in the last 8760 hours. CBG: Recent Labs  Lab 05/11/20 0435  GLUCAP 143*   D-Dimer: No results for input(s): DDIMER in the last 72 hours. Hgb A1c: Recent Labs    05/13/20 0100  HGBA1C 3.9*   Lipid Profile: Recent Labs    05/13/20 0100  CHOL 108  HDL 59  LDLCALC 23  TRIG 131  CHOLHDL 1.8   Thyroid function studies: No results for input(s): TSH, T4TOTAL, T3FREE, THYROIDAB in the last 72 hours.  Invalid input(s):  FREET3 Anemia work up: No results for input(s): VITAMINB12, FOLATE, FERRITIN, TIBC, IRON, RETICCTPCT in the last 72 hours. Sepsis Labs: Recent Labs  Lab 05/10/20 0411 05/11/20 0440 05/11/20 0659 05/11/20 1006 05/12/20 0056 05/14/20 0340  WBC 8.5 12.7*  --   --  17.5* 11.4*  LATICACIDVEN  --  >11.0* 3.2* 1.3  --   --     Microbiology No results found for this or any previous visit (from the past 240 hour(s)).  Procedures and diagnostic studies:  IR ANGIO INTRA EXTRACRAN SEL INTERNAL CAROTID BILAT MOD SED  Result Date: 05/14/2020 PROCEDURE: DIAGNOSTIC CEREBRAL ANGIOGRAM HISTORY: The patient is a 70 year old man in mid to the hospital after being found down at home. Workup included CT scan demonstrating subarachnoid hemorrhage while initial CT angiogram was negative for intracranial aneurysm. Patient has been monitored in the hospital and noted to have worsening confusion and aphasia which has improved over the last few days. He presents today for follow-up diagnostic cerebral angiogram. ACCESS: The technical aspects of the procedure as well as its potential risks and benefits were reviewed with the patient. These risks included but were not limited bleeding, infection, allergic reaction, damage to organs or vital structures, stroke, non-diagnostic procedure, and the catastrophic outcomes of heart attack, coma, and death. With an understanding of these risks, informed consent was obtained and witnessed. The patient was placed in the supine position on the angiography table and the skin of right groin prepped in the usual sterile fashion. The procedure was performed under local anesthesia (1%-solution of bicarbonate-buffered Lidocaine)  and conscious sedation with 1mg  versed and 23micrograms fentanyl monitored by myself and the in-suite nurse using continuous pulse-oximetry, heart rate, and non-invasive blood-pressure. A 5- French sheath was introduced in the right common femoral artery using  Seldinger technique. A fluoro-phase sequence was used to document the sheath position. MEDICATIONS: HEPARIN: 3000 Units total. CONTRAST:  cc, Omnipaque 300 FLUOROSCOPY TIME:  FLUOROSCOPY TIME: See IR records TECHNIQUE: CATHETERS AND WIRES 5-French JB-1 catheter 0.035" glidewire VESSELS CATHETERIZED Right internal carotid Left internal carotid Left vertebral Right vertebral Right common femoral VESSELS STUDIED Right internal carotid, head Left internal carotid, head Left vertebral Right vertebral Right femoral PROCEDURAL NARRATIVE A 5-Fr JB-1 catheter was advanced over a 0.035 glidewire into the aortic arch. The above vessels were then sequentially catheterized and cervical / cerebral angiograms taken. After review of images, the catheter was removed without incident. FINDINGS: Right internal carotid, head: Injection reveals the presence of a widely patent ICA, M1, and A1 segments and their branches. No aneurysms, AVMs, or high-flow fistulas are seen. Incidental note is made of triplicate A2 segments. There is moderate to severe vasospasm involving the right A2 and more distal segments of the anterior cerebral artery. There is mild vasospasm involving the M2 and more distal segments of the right middle cerebral artery. The parenchymal and venous phases are normal. The venous sinuses are widely patent. Left internal carotid, head: Injection reveals the presence of a widely patent ICA, A1, and M1 segments and their branches. No aneurysms, AVMs, or high-flow fistulas are seen. There is severe vasospasm involving the right A1 segment. There is also moderate to severe vasospasm involving the M2 and more distal segments of the left middle cerebral artery. The parenchymal and venous phases are normal. The venous sinuses are widely patent. Right vertebral: Injection reveals the presence of a widely patent vertebral artery. This leads to a widely patent basilar artery that terminates in bilateral P1. The basilar apex is  normal. No aneurysms, AVMs, or high-flow fistulas are seen. There is mild vaso spasm involving primarily the left posterior cerebral artery territory. The parenchymal and venous phases are normal. The venous sinuses are widely patent. Right vertebral: The vertebral artery is widely patent. No PICA aneurysm is seen. See basilar description above. Right femoral: Normal vessel. No significant atherosclerotic disease. Arterial sheath in adequate position. DISPOSITION: Upon completion of the study, the femoral sheath was removed and hemostasis obtained using a 5-Fr ExoSeal closure device. Good proximal and distal lower extremity pulses were documented upon achievement of hemostasis. The procedure was well tolerated and no early complications were observed. The patient was transferred to the holding area to lay flat for 2 hours. IMPRESSION: 1. No intracranial aneurysms, arteriovenous malformations, or fistulas are identified as an etiology for subarachnoid hemorrhage. 2. Moderate to severe vasospasm involving bilateral anterior circulation and mild vasospasm involving the left posterior cerebral artery territory, as described above. The preliminary results of this procedure were shared with the patient and the patient's family. Electronically Signed   By: Consuella Lose   On: 05/14/2020 14:35               LOS: 14 days   [place] [facility]   Pager on [email or web address]. If 7PM-7AM, please contact night-coverage at [email or web address]     05/14/2020, 4:15 PM

## 2020-05-14 NOTE — Progress Notes (Signed)
Patient returned back from IR.  Right femoral site marked and closed at 1422.  Site is clean dry and intact.  Pulses palpable.  Patient is alert and oriented.

## 2020-05-14 NOTE — Brief Op Note (Signed)
  NEUROSURGERY BRIEF OPERATIVE  NOTE   PREOP DX: Subarachnoid Hemorrhage  POSTOP DX: Same  PROCEDURE: Diagnostic cerebral angiogram  SURGEON: Dr. Consuella Lose, MD  ANESTHESIA: IV Sedation with Local  EBL: Minimal  SPECIMENS: None  COMPLICATIONS: None  CONDITION: Stable to recovery  FINDINGS (Full report in [facility]): 1. No intracranial aneurysms, AVMs, or fistulas seen 2. Moderate to severe vasospasm of bilateral anterior circulation, and mild spasm of the left PCA territory.   Consuella Lose, MD [facility]

## 2020-05-14 NOTE — Progress Notes (Signed)
STROKE TEAM PROGRESS NOTE    Subjective: Patient in bed,  Able to follow commands and answer questions.  Mental status appears alert improved in the last few days.  Cerebral catheter angiogram done by Dr. Kathyrn Sheriff shows no evidence of aneurysms, AVMs or high-grade stenosis.  There is mild narrowing of the ACAs and PCA suggesting mild vasospasm but I am not impressed     OBJECTIVE Vitals:   05/14/20 1405 05/14/20 1410 05/14/20 1415 05/14/20 1430  BP: (!) 156/86 (!) 151/82 (!) 150/84 (!) 146/93  Pulse: 75 75 75 80  Resp: 19 19 (!) 27 (!) 23  Temp:      TempSrc:      SpO2: 100% 100% 100% 100%  Weight:      Height:        CBC:  Recent Labs  Lab 05/11/20 0440 05/11/20 1005 05/12/20 0056 05/14/20 0340  WBC 12.7*  --  17.5* 11.4*  NEUTROABS 10.8*  --   --   --   HGB 14.7   < > 12.3* 13.0  HCT 39.8   < > 33.3* 35.2*  MCV 95.4  --  95.4 95.7  PLT 307  --  273 292   < > = values in this interval not displayed.    Basic Metabolic Panel:  Recent Labs  Lab 05/11/20 0440 05/11/20 1005 05/13/20 0100 05/14/20 0340  NA 131*   < > 136 135  K 4.3   < > 3.1* 2.8*  CL 95*   < > 105 105  CO2 15*   < > 16* 18*  GLUCOSE 146*   < > 128* 93  BUN 24*   < > 21 11  CREATININE 2.16*   < > 1.56* 1.08  CALCIUM 9.8   < > 8.8* 8.7*  MG 2.7*  --   --  1.5*  PHOS  --   --   --  2.6   < > = values in this interval not displayed.    Lipid Panel:     Component Value Date/Time   CHOL 108 05/13/2020 0100   TRIG 131 05/13/2020 0100   HDL 59 05/13/2020 0100   CHOLHDL 1.8 05/13/2020 0100   VLDL 26 05/13/2020 0100   LDLCALC 23 05/13/2020 0100   HgbA1c:  Lab Results  Component Value Date   HGBA1C 3.9 (L) 05/13/2020   Urine Drug Screen:     Component Value Date/Time   LABOPIA NONE DETECTED 05/01/2020 0259   COCAINSCRNUR NONE DETECTED 05/01/2020 0259   COCAINSCRNUR NEG 09/11/2014 1505   LABBENZ NONE DETECTED 05/01/2020 0259   LABBENZ NEG 09/11/2014 1505   AMPHETMU NONE DETECTED  05/01/2020 0259   THCU NONE DETECTED 05/01/2020 0259   LABBARB NONE DETECTED 05/01/2020 0259    Alcohol Level     Component Value Date/Time   ETH 328 (HH) 04/30/2020 2038    IMAGING  CT HEAD WO CONTRAST 04/30/20 IMPRESSION: 1. Moderate to large amount of acute subarachnoid /extra-axial hemorrhage, most heavily concentrated at the suprasellar cistern, and anterior to the brainstem. Findings felt consistent with ruptured aneurysm. Additional foci of extra-axial blood within the posterior fossa, left greater than right middle cranial fossa, and anterior inter hemispheric region. Small amount of bilateral intraventricular and fourth ventricular hemorrhage. Slight interval increase in size/dilatation of lateral and third ventricles compared to previous. 2. Atrophy.  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  CTA HEAD AND NECK 05/01/20 IMPRESSION: 1. Negative intracranial CTA. No visible aneurysm or other vascular  abnormality to explain patient's intracranial hemorrhage. 2. No large vessel occlusion. 3. Mild for age atheromatous change about the carotid bifurcations and carotid siphons without hemodynamically significant stenosis. 4. Age-indeterminate compression deformities at the superior endplates of C7, T3, and T4. Correlation with physical exam recommended. Findings could be further assessed with dedicated MRI as clinically warranted.  CT HEAD WO CONTRAST 05/11/2020 IMPRESSION:  1. Further regression of bilateral extra-axial hemorrhage since 05/06/2020, only small volume residual. Superimposed chronic bilateral subdural hygromas remain stable. Resolved intraventricular blood.  2. No significant intracranial mass effect. No new intracranial abnormality.   MR BRAIN WO CONTRAST 05/11/2020   IMPRESSION:  1. Small acute infarcts in the left basal ganglia, left periatrial temporal lobe and high bilateral frontoparietal region. No substantial associated edema or mass effect.  2. The  ventricular system is slightly increased in size compared to the prior MRI with mild convexity of the third ventricle and rounding of the temporal horns, concerning for developing hydrocephalus  3. When compared to prior MRI, decreased small volume acute/subacute parafalcine subdural hemorrhage and scattered subarachnoid hemorrhage, as detailed above. Similar bilateral subdural hygromas.   DG Elbow 2 Views Right 05/11/2020 IMPRESSION:  Negative.   DG Knee 1-2 Views Right 05/11/2020 IMPRESSION:  1. No acute fracture or dislocation identified about the right knee  2. Advanced calcified peripheral vascular disease.   DG Hand 2 View Right 05/11/2020 IMPRESSION:  1. Difficult to exclude scaphoid fracture on these two views (and lateral view is oblique). Recommend dedicated Four View Right Wrist Series if pain in this region persists.  2. Otherwise no acute fracture or dislocation identified about the right hand.   DG Abd Portable 1V 05/11/2020 IMPRESSION:  Nasogastric tube tip seen in proximal stomach. No evidence of bowel obstruction or ileus.   ECHOCARDIOGRAM COMPLETE 05/11/2020 IMPRESSIONS   1. Left ventricular ejection fraction, by estimation, is 55 to 60%. The left ventricle has normal function. The left ventricle has no regional wall motion abnormalities. There is moderate left ventricular hypertrophy. Left ventricular diastolic parameters are consistent with Grade I diastolic dysfunction (impaired relaxation).   2. Right ventricular systolic function is mildly reduced. The right ventricular size is mildly enlarged.   3. Left atrial size was mildly dilated.   4. The mitral valve is normal in structure. Trivial mitral valve regurgitation. No evidence of mitral stenosis.   5. The aortic valve is tricuspid. Aortic valve regurgitation is mild. Mild aortic valve stenosis.   6. Aortic dilatation noted. There is mild dilatation of the aortic root, measuring 41 mm.   7. The inferior vena cava  is normal in size with greater than 50% respiratory variability, suggesting right atrial pressure of 3 mmHg.   EEG 05/11/2020 ABNORMALITY - Continuous slow, generalized  IMPRESSION:  This study is suggestive of moderate diffuse encephalopathy, nonspecific etiology. No seizures or epileptiform discharges were seen throughout the recording.  Transcranial Doppler study 05/12/2020: Slightly low velocities in left MCA and basilar artery suggestive of mild distal stenosis but no definite evidence of vasospasm noted Cerebral catheter angiogram : 1.No intracranial aneurysms, arteriovenous malformations, or fistulas are identified as an etiology for subarachnoid hemorrhage. 2. Moderate to severe vasospasm involving bilateral anterior circulation and mild vasospasm involving the left posterior cerebral artery territory ECG - SR rate 72 BPM. Anteroseptal infarct age undetermined. (See cardiology reading for complete details)  PHYSICAL EXAM Blood pressure (!) 146/93, pulse 80, temperature 98.6 F (37 C), temperature source Oral, resp. rate (!) 23, height 6\' 1"  (1.854  m), weight 83 kg, SpO2 100 %.  Pleasant middle-age Caucasian male or in distress . Afebrile. Head is nontraumatic. Neck is supple without bruit.    Cardiac exam no murmur or gallop. Lungs are clear to auscultation. Distal pulses are well felt.  Neurological Exam : Awake alert oriented to place only.  Diminished attention, registration and recall.     Follows simple midline and one-step commands.  Speech is nonfluent and hesitant with occasional word finding difficulty with good comprehension and able to name and repeat well.  Extraocular movements are full range without nystagmus.  Blinks to threat bilaterally.  Face is symmetric without weakness.  Tongue midline.  Motor system exam no upper or lower extremity drift symmetric and equal strength in all 4 extremities.  No facial mild action tremor of both outstretched upper extremities.  Deep  tendon reflexes symmetric.  Plantars downgoing.  Withdraws to painful stimuli equally in all 4 extremities.  Gait not tested.  ASSESSMENT/PLAN Jeffrey Acosta is a 70 y.o. male with history of ETOH and tobacco abuse, bipolar disorder, atrial tachycardia, migraines, hypothyroidism, diverticulosis, GERD, hyperlipidemia, osteopenia, and polyarthralgia who was admitted to [facility] 04/30/20 with a large SAH. Neurosurgery was consulted and no surgery was felt to be indicated at that time. On 05/10/20, while still an inpatient, the family noted aphasia, inattention to conversation, and decreased responsiveness. Yesterday, 05/11/20, the pt. apparently left his hospital room to have a cigarette and was found down, outside, hypothermic and unresponsive. He was transferred to the NICU. He did not receive IV t-PA due to [place].  Stroke: [facility] - acute subarachnoid /extra-axial hemorrhage, most heavily concentrated at the suprasellar cistern, and anterior to the brainstem - possibly 2/2 nonaneurysmal perimesencephalic hemorrhage  Resultant encephalopathy and mild nonfluent speech  Code Stroke CT Head - not ordered  CT head 04/30/20 - Moderate to large amount of acute subarachnoid /extra-axial hemorrhage, most heavily concentrated at the suprasellar cistern, and anterior to the brainstem. Findings felt consistent with ruptured aneurysm.  CT head 05/11/20 - Further regression of bilateral extra-axial hemorrhage since 05/06/2020, only small volume residual. Superimposed chronic bilateral subdural hygromas remain stable. Resolved intraventricular blood.   MRI head - Small acute infarcts in the left basal ganglia, left periatrial temporal lobe and high bilateral frontoparietal region. No substantial associated edema or mass effect. The ventricular system is slightly increased in size compared to the prior MRI with mild convexity of the third ventricle and rounding of the temporal horns, concerning for developing hydrocephalus. When  compared to prior MRI, decreased small volume acute/subacute parafalcine subdural hemorrhage and scattered subarachnoid hemorrhage, as detailed above. Similar bilateral subdural hygromas.   MRA head - not ordered  CTA H&N -  Negative intracranial CTA. No visible aneurysm or other vascular abnormality to explain patient's intracranial hemorrhage. No large vessel occlusion. Mild for age atheromatous change about the carotid bifurcations and carotid siphons without hemodynamically significant stenosis. Age-indeterminate compression deformities at the superior endplates of C7, T3, and T4. Correlation with physical exam recommended. Findings could be further assessed with dedicated MRI as clinically warranted.  CT Perfusion - not ordered  Carotid Doppler - CTA neck ordered - carotid dopplers not indicated.  EEG - moderate diffuse encephalopathy, nonspecific etiology. No seizures or epileptiform discharges were seen throughout the recording.  2D Echo - EF 55 - 60%. No cardiac source of emboli identified.   Sars Corona Virus 2 - negative  LDL -23  HgbA1c 3.9  UDS - negative  ETOH -  328 on 04/30/20  VTE prophylaxis - SCDs Diet  Diet Order            Diet - low sodium heart healthy                 aspirin 81 mg daily prior to admission, now on No antithrombotic  Ongoing aggressive stroke risk factor management  Therapy recommendations:  SNF  Disposition:  Pending  Hypertension  Home BP meds: Toprol XL 100 mg daily  Current BP meds: Apresoline 25 mg Q 6 hrs ; Nimodipine 60 mg Q 4 hrs  Stable . Systolic blood pressure goal < 160 mm Hg . Long-term BP goal normotensive  Hyperlipidemia  Home Lipid lowering medication: Lipitor 20 mg daily  LDL 41 (OV 03/14/20), goal < 70  Current lipid lowering medication: Lipitor 20 mg daily -> will D/C - (statin contraindicated with ICH)   Other Stroke Risk Factors  Advanced age  Cigarette smoker - advised to stop smoking  Previous  ETOH abuse  Family hx stroke (grandparents)   Migraines  Other Active Problems, Findings, Recommendations and/or Plan  Code status - Full code  Sepsis - Zosyn started 1/15 ; Vancomycin added 1/16  Leukocytosis - WBC's - 12.7->17.5  T max - 99.3  Vasospasm prophylaxis - nimodipine 60 mg Q 4 hrs  Will check lipids and HgbA1c   Advanced calcified peripheral vascular disease by imaging  AKI - creatinine - 0.97->2.16->2.03    Hyponatremia - Na - 126->131->128->134     Possible Rt wrist fx by imaging - may need f/u (see above)  Hospital day # 14 Discussed with Dr. Kathyrn Sheriff and dr Mal Misty. Continue nimodipine for mild f vasospasm for 21 days  Consider transfer to neurology floor bed.  Stroke team will sign off.  Kindly call for questions greater than 50% time during this 35-minute visit was spent on counseling and coordination of care about his strokes and intracerebral hemorrhage and discussion with care team and answering questions. Antony Contras, MD,

## 2020-05-14 NOTE — Progress Notes (Signed)
  Speech Language Pathology Treatment: Cognitive-Linquistic  Patient Details Name: Jeffrey Acosta MRN: [id number] DOB: 05-10-1950 Today's Date: 05/14/2020 Time: 3382-5053 SLP Time Calculation (min) (ACUTE ONLY): 20 min  Assessment / Plan / Recommendation Clinical Impression  Pt's expressive and receptive language is much improved from 1/16.  Today pt is able to follow two and three-step commands with 90% accuracy.  He demonstrated improved divergent naming, generating up to ten items per category within a minute's time. He is able to shift set without difficulty and there were no episodes of verbal perseveration.  Expression is still deliberate and slower than usual as he retrieves words, and linguistic complexity is not at baseline when he sequences steps to a task, but his performance is definitely better. SLP will continue to follow while admitted.   HPI HPI: 70 y.o. male admitted 04/30/20 following sudden collapse at home and dx with [facility]. CT: moderate to large amount of acute SAH, felt consistent with ruptured aneurysm with a small amount of bilateral IVH and slight increase in size of lateral and third ventricles. 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 In 1/15, pt wandered off away from inpatient room for approximately 2 hours. Per chart review, patient later reported that he had gone to smoke a cigarette. Was found down outside of the hospital unresponsive and hypothermic. Transferred to ICU at this time.  MRI 1/15 shows tiny punctate left basal ganglia and periventricular white matter temporal infarcts; subarachnoid hemorrhage appears to have improved. Hx also includes ETOH and tobacco abuse, bipolar disorder, hypothyroidism, diverticulosis, GERD, hyperlipidemia, osteopenia, and polyarthralgia.      SLP Plan  Continue with current plan of care       Recommendations                   Follow up Recommendations: Skilled Nursing facility SLP Visit Diagnosis: Aphasia  (R47.01) Plan: Continue with current plan of care       GO                Assunta Curtis 05/14/2020, 10:58 AM  Estill Bamberg L. Tivis Ringer, [place] Office number [phone] Pager [phone]

## 2020-05-15 DIAGNOSIS — I10 Essential (primary) hypertension: Secondary | ICD-10-CM | POA: Diagnosis not present

## 2020-05-15 LAB — BASIC METABOLIC PANEL
Anion gap: 15 (ref 5–15)
BUN: 16 mg/dL (ref 8–23)
CO2: 15 mmol/L — ABNORMAL LOW (ref 22–32)
Calcium: 9.2 mg/dL (ref 8.9–10.3)
Chloride: 103 mmol/L (ref 98–111)
Creatinine, Ser: 1.09 mg/dL (ref 0.61–1.24)
GFR, Estimated: >60 mL/min (ref >60)
Glucose, Bld: 104 mg/dL — ABNORMAL HIGH (ref 70–99)
Potassium: 4 mmol/L (ref 3.5–5.1)
Sodium: 133 mmol/L — ABNORMAL LOW (ref 135–145)

## 2020-05-15 LAB — MAGNESIUM: Magnesium: 2.1 mg/dL (ref 1.7–2.4)

## 2020-05-15 LAB — PHOSPHORUS: Phosphorus: 3.4 mg/dL (ref 2.5–4.6)

## 2020-05-15 MED ORDER — ALLOPURINOL 100 MG PO TABS
300.0000 mg | ORAL_TABLET | Freq: Every day | ORAL | Status: DC
  Administered 2020-05-15 – 2020-05-16 (×2): 300 mg via ORAL
  Filled 2020-05-15: qty 1
  Filled 2020-05-15: qty 3

## 2020-05-15 MED ORDER — VITAMIN B-12 100 MCG PO TABS
500.0000 ug | ORAL_TABLET | Freq: Every day | ORAL | Status: DC
  Administered 2020-05-15 – 2020-05-16 (×2): 500 ug via ORAL
  Filled 2020-05-15: qty 5
  Filled 2020-05-15: qty 1

## 2020-05-15 MED ORDER — METOPROLOL SUCCINATE ER 25 MG PO TB24
25.0000 mg | ORAL_TABLET | Freq: Every day | ORAL | Status: DC
  Administered 2020-05-15 – 2020-05-16 (×2): 25 mg via ORAL
  Filled 2020-05-15 (×2): qty 1

## 2020-05-15 MED ORDER — TAMSULOSIN HCL 0.4 MG PO CAPS
0.4000 mg | ORAL_CAPSULE | Freq: Every day | ORAL | Status: DC
  Administered 2020-05-15: 0.4 mg via ORAL
  Filled 2020-05-15: qty 1

## 2020-05-15 NOTE — Progress Notes (Signed)
Pt has arrived to the unit via hospital bed. Pt denies and pain and has telephone and call light within reach  05/15/20 1431  Vitals  Temp 97.7 F (36.5 C)  Temp Source Oral  BP 139/90  MAP (mmHg) 105  BP Location Left Arm  BP Method Automatic  Patient Position (if appropriate) Lying  Pulse Rate 66  Pulse Rate Source Dinamap  Resp 20  Level of Consciousness  Level of Consciousness Alert  Oxygen Therapy  SpO2 100 %  O2 Device Room Air  Pain Assessment  Pain Scale 0-10  Pain Score 0

## 2020-05-15 NOTE — Progress Notes (Signed)
Occupational Therapy Treatment Patient Details Name: Jeffrey Acosta MRN: [id number] DOB: Mar 27, 1951 Today's Date: 05/15/2020    History of present illness 70 yo male presenting from home after a fall at home. 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 cerebral angiogram on 05/14/2020 PMH includes: alcohol use, bipolar affective disorder,  hypothyroidism, atrial tachycardia.   OT comments  Pt wife expressed feeling better about taking patient home than before. Pt wife expressed she does not drive at night and has a pending car appointment in the morning. Pt currently met his OT goals and could d/c home at adequate level. Recommendation now for [facility].    Follow Up Recommendations  Home health OT    Equipment Recommendations  None recommended by OT    Recommendations for Other Services      Precautions / Restrictions Precautions Precautions: Fall;Other (comment)       Mobility Bed Mobility Overal bed mobility: Modified Independent                Transfers Overall transfer level: Modified independent                    Balance                               High Level Balance Comments: pt demonstrate fall risk with single leg standing task           ADL either performed or assessed with clinical judgement   ADL Overall ADL's : Needs assistance/impaired     Grooming: Wash/dry hands;Supervision/safety;Standing               Lower Body Dressing: Supervision/safety   Toilet Transfer: Supervision/safety;Ambulation;Regular Toilet       Tub/ Shower Transfer: Minimal assistance;Tub transfer Tub/Shower Transfer Details (indicate cue type and reason): wife expressed concern with transfer. pt does better entering vs exiting the tub. pt with fall risk. Wife advised to help with all transfers. Functional mobility during ADLs: Supervision/safety General ADL Comments: pt ambulating without DME. pt  able to turn head with transfer. pt wife expressed not driving at night and feeling more confident taking him home now. Pt wife state she has a car appointment in the morning and can be here between 10-12 pm after appointment     Vision       Perception     Praxis      Cognition Arousal/Alertness: Awake/alert Behavior During Therapy: Flat affect Overall Cognitive Status: Impaired/Different from baseline Area of Impairment: Safety/judgement                               General Comments: pt asking wife "do i call for them now?" pt asking if he should call RN staff for help because OT was asking him to get up OOB. Pt showing recall of the need to get help but not aware OT is staff is allowed to move him        Exercises     Shoulder Instructions       General Comments decreased speed    Pertinent Vitals/ Pain       Pain Assessment: No/denies pain  Home Living  Prior Functioning/Environment              Frequency  Min 2X/week        Progress Toward Goals  OT Goals(current goals can now be found in the care plan section)  Progress towards OT goals: Progressing toward goals  Acute Rehab OT Goals Patient Stated Goal: to go home OT Goal Formulation: With patient/family Time For Goal Achievement: 05/22/20 Potential to Achieve Goals: Good ADL Goals Additional ADL Goal #1: pt will complete dynamic standing balance task for greater tahn 6 inches Additional ADL Goal #2: Pt will complete a full adl mod I  Plan Discharge plan remains appropriate    Co-evaluation                 AM-PAC OT "6 Clicks" Daily Activity     Outcome Measure   Help from another person eating meals?: None Help from another person taking care of personal grooming?: A Little Help from another person toileting, which includes using toliet, bedpan, or urinal?: A Little Help from another person bathing (including  washing, rinsing, drying)?: A Little Help from another person to put on and taking off regular upper body clothing?: A Little Help from another person to put on and taking off regular lower body clothing?: A Little 6 Click Score: 19    End of Session    OT Visit Diagnosis: Unsteadiness on feet (R26.81)   Activity Tolerance Patient tolerated treatment well   Patient Left Other (comment) (with PT Yvone Neu)   Nurse Communication Mobility status        Time: 9983-3825 OT Time Calculation (min): 14 min  Charges: OT General Charges $OT Visit: 1 Visit OT Treatments $Self Care/Home Management : 8-22 mins   Brynn, OTR/L  Acute Rehabilitation Services Pager: [phone] Office: [phone] .    Jeri Modena 05/15/2020, 4:29 PM

## 2020-05-15 NOTE — Progress Notes (Addendum)
PROGRESS NOTE    [name]  HGD:[id number] DOB: August 11, 1950 DOA: 04/30/2020 PCP: Colon Branch, MD    Chief Complaint  Patient presents with  . Loss of Consciousness  . Alcohol Intoxication    Brief Narrative:  Jeffrey Acosta is a 70 y.o. male with medical history significant for alcohol use disorder, tobacco use disorder, atrial tachycardia, migraine, hyperlipidemia, bipolar disorder, hypothyroidism, melanoma, polyarthralgia, colonic diverticulosis, GERD, gout, was admitted to the hospital on 04/30/2020 after he collapsed at home with loss of consciousness.  He was found to have subarachnoid hemorrhage.  Neurosurgery was consulted and no surgery was indicated per neurosurgeon.  Reportedly, he left his hospital room on 05/11/2020 to go and smoke a cigarette outside.  Unfortunately, he was found down, outside of the hospital.  He was hypothermic and unresponsive.  He was transferred to the neuro ICU.  MRI brain showed small acute infarcts in the left basal ganglia, left periatrial temporal lobe and high bilateral frontoparietal regions.  He underwent diagnostic cerebral angiogram on 05/14/2020.   No intracranial aneurysms, AVMs or fistulas were seen but there was moderate to severe vasospasm of bilateral anterior circulation, and mild spasm of the left PCA territory.  Case was discussed with neurologist, Dr. Leonie Man, recommended 21-day course of pneumonia pain for vasospasms.   Subjective:  Reported feeling better, denies headache, no weakness, no speech disturbance Wife at bedside  Assessment & Plan:   Principal Problem:   Subarachnoid hemorrhage (HCC) Active Problems:   Alcohol abuse   Bipolar I disorder (HCC)   Hypothyroidism   Benign essential HTN   Acute stroke (Subarachnoid hemorrhage with vasospasm, small acute infarcts in the left basal ganglia, left periatrial temporal lobe and right bilateral frontoparietal regions): s/p cerebral angiogram on 05/14/2020. Neurology  recommend continue nimodipine for total of 21 days.  Neurology recommend Discontinue statin since statin is contraindicated in the setting of [place] EEG showed moderately diffuse encephalopathy but no seizures or epileptiform discharges were noted. He is improved, transferred out of ICU to neurology floor per neurology recommendation PT OT recommend skilled nursing facility placement   Hypertension/history of atrial tachycardia He started on the nimodipine for vasospasm Gradually restart home blood pressure medication metoprolol XL  Metabolic acidosis, continue sodium bicarb  AKI; resolved  Electrolyte abnormalities: Hyponatremia, improved Hypokalemia, replaced Hypomagnesemia, replaced  Thrombocytopenia, resolved  Alcohol use disorder Alcohol intoxication on presentation, alcohol level 320 on admission  Continue thiamine and folic acid Does not appear to have significant withdrawal syndrome Alcohol cessation education provided  Gout, stable, resume allopurinol  BPH, stable resume Flomax  Hypothyroidism: Continue Synthroid    DVT prophylaxis: SCDs Start: 04/30/20 2354   Code Status: Full Family Communication: Wife at bedside Disposition:   Status is: Inpatient   Dispo: The patient is from: Home              Anticipated d/c is to: Skilled nursing facility              Anticipated d/c date is: When there is a bed              Patient currently is medically stable to discharge  Consultants:  Neurologist  Intensivist  Neurosurgeon  Procedures:  Cerebral angiogram on 05/14/2020     Objective: Vitals:   05/15/20 0700 05/15/20 0800 05/15/20 1000 05/15/20 1100  BP: (!) 154/85 (!) 146/97 (!) 152/89 (!) 162/88  Pulse: 82     Resp: (!) 23 (!) 21 16 (!) 23  Temp:      TempSrc:      SpO2: 100% 98%    Weight:      Height:        Intake/Output Summary (Last 24 hours) at 05/15/2020 1358 Last data filed at 05/15/2020 0800 Gross per 24 hour  Intake 534.05 ml   Output 400 ml  Net 134.05 ml   Filed Weights   05/11/20 0500 05/13/20 0500 05/14/20 0500  Weight: 83 kg 83.1 kg 83 kg    Examination:  General exam: calm, NAD Respiratory system: Clear to auscultation. Respiratory effort normal. Cardiovascular system: S1 & S2 heard, RRR. No JVD, no murmur, No pedal edema. Gastrointestinal system: Abdomen is nondistended, soft and nontender. Normal bowel sounds heard. Central nervous system: Alert and oriented. No focal neurological deficits. Extremities: Generalized weakness, no focal deficit Skin: No rashes, lesions or ulcers Psychiatry: Judgement and insight appear normal. Mood & affect appropriate.     Data Reviewed: I have personally reviewed following labs and imaging studies  CBC: Recent Labs  Lab 05/10/20 0411 05/11/20 0440 05/11/20 1005 05/12/20 0056 05/14/20 0340  WBC 8.5 12.7*  --  17.5* 11.4*  NEUTROABS  --  10.8*  --   --   --   HGB 14.6 14.7 11.2* 12.3* 13.0  HCT 39.5 39.8 33.0* 33.3* 35.2*  MCV 95.2 95.4  --  95.4 95.7  PLT 225 307  --  273 878    Basic Metabolic Panel: Recent Labs  Lab 05/10/20 0411 05/11/20 0440 05/11/20 1005 05/12/20 0056 05/13/20 0100 05/14/20 0340 05/15/20 0422  NA 126* 131* 128* 134* 136 135 133*  K 3.7 4.3 3.6 3.9 3.1* 2.8* 4.0  CL 93* 95*  --  105 105 105 103  CO2 21* 15*  --  14* 16* 18* 15*  GLUCOSE 86 146*  --  105* 128* 93 104*  BUN 16 24*  --  27* 21 11 16   CREATININE 0.97 2.16*  --  2.03* 1.56* 1.08 1.09  CALCIUM 9.1 9.8  --  8.5* 8.8* 8.7* 9.2  MG 2.1 2.7*  --   --   --  1.5* 2.1  PHOS  --   --   --   --   --  2.6 3.4    GFR: Estimated Creatinine Clearance: 70.2 mL/min (by C-G formula based on SCr of 1.09 mg/dL).  Liver Function Tests: Recent Labs  Lab 05/11/20 0440  AST 35  ALT 25  ALKPHOS 67  BILITOT 2.0*  PROT 6.4*  ALBUMIN 3.9    CBG: Recent Labs  Lab 05/11/20 0435  GLUCAP 143*     No results found for this or any previous visit (from the past 240  hour(s)).       Radiology Studies: IR ANGIO INTRA EXTRACRAN SEL INTERNAL CAROTID BILAT MOD SED  Result Date: 05/14/2020 PROCEDURE: DIAGNOSTIC CEREBRAL ANGIOGRAM HISTORY: The patient is a 70 year old man in mid to the hospital after being found down at home. Workup included CT scan demonstrating subarachnoid hemorrhage while initial CT angiogram was negative for intracranial aneurysm. Patient has been monitored in the hospital and noted to have worsening confusion and aphasia which has improved over the last few days. He presents today for follow-up diagnostic cerebral angiogram. ACCESS: The technical aspects of the procedure as well as its potential risks and benefits were reviewed with the patient. These risks included but were not limited bleeding, infection, allergic reaction, damage to organs or vital structures, stroke, non-diagnostic procedure, and the catastrophic outcomes  of heart attack, coma, and death. With an understanding of these risks, informed consent was obtained and witnessed. The patient was placed in the supine position on the angiography table and the skin of right groin prepped in the usual sterile fashion. The procedure was performed under local anesthesia (1%-solution of bicarbonate-buffered Lidocaine) and conscious sedation with 1mg  versed and 31micrograms fentanyl monitored by myself and the in-suite nurse using continuous pulse-oximetry, heart rate, and non-invasive blood-pressure. A 5- French sheath was introduced in the right common femoral artery using Seldinger technique. A fluoro-phase sequence was used to document the sheath position. MEDICATIONS: HEPARIN: 3000 Units total. CONTRAST:  cc, Omnipaque 300 FLUOROSCOPY TIME:  FLUOROSCOPY TIME: See IR records TECHNIQUE: CATHETERS AND WIRES 5-French JB-1 catheter 0.035" glidewire VESSELS CATHETERIZED Right internal carotid Left internal carotid Left vertebral Right vertebral Right common femoral VESSELS STUDIED Right internal  carotid, head Left internal carotid, head Left vertebral Right vertebral Right femoral PROCEDURAL NARRATIVE A 5-Fr JB-1 catheter was advanced over a 0.035 glidewire into the aortic arch. The above vessels were then sequentially catheterized and cervical / cerebral angiograms taken. After review of images, the catheter was removed without incident. FINDINGS: Right internal carotid, head: Injection reveals the presence of a widely patent ICA, M1, and A1 segments and their branches. No aneurysms, AVMs, or high-flow fistulas are seen. Incidental note is made of triplicate A2 segments. There is moderate to severe vasospasm involving the right A2 and more distal segments of the anterior cerebral artery. There is mild vasospasm involving the M2 and more distal segments of the right middle cerebral artery. The parenchymal and venous phases are normal. The venous sinuses are widely patent. Left internal carotid, head: Injection reveals the presence of a widely patent ICA, A1, and M1 segments and their branches. No aneurysms, AVMs, or high-flow fistulas are seen. There is severe vasospasm involving the right A1 segment. There is also moderate to severe vasospasm involving the M2 and more distal segments of the left middle cerebral artery. The parenchymal and venous phases are normal. The venous sinuses are widely patent. Right vertebral: Injection reveals the presence of a widely patent vertebral artery. This leads to a widely patent basilar artery that terminates in bilateral P1. The basilar apex is normal. No aneurysms, AVMs, or high-flow fistulas are seen. There is mild vaso spasm involving primarily the left posterior cerebral artery territory. The parenchymal and venous phases are normal. The venous sinuses are widely patent. Right vertebral: The vertebral artery is widely patent. No PICA aneurysm is seen. See basilar description above. Right femoral: Normal vessel. No significant atherosclerotic disease. Arterial sheath  in adequate position. DISPOSITION: Upon completion of the study, the femoral sheath was removed and hemostasis obtained using a 5-Fr ExoSeal closure device. Good proximal and distal lower extremity pulses were documented upon achievement of hemostasis. The procedure was well tolerated and no early complications were observed. The patient was transferred to the holding area to lay flat for 2 hours. IMPRESSION: 1. No intracranial aneurysms, arteriovenous malformations, or fistulas are identified as an etiology for subarachnoid hemorrhage. 2. Moderate to severe vasospasm involving bilateral anterior circulation and mild vasospasm involving the left posterior cerebral artery territory, as described above. The preliminary results of this procedure were shared with the patient and the patient's family. Electronically Signed   By: Consuella Lose   On: 05/14/2020 14:35        Scheduled Meds: . allopurinol  300 mg Oral Daily  . Chlorhexidine Gluconate Cloth  6 each  Topical Daily  . folic acid  1 mg Oral Daily  . levothyroxine  50 mcg Oral QAC breakfast  . metoprolol succinate  25 mg Oral Daily  . multivitamin with minerals  1 tablet Oral Daily  . niMODipine  60 mg Oral Q4H  . pantoprazole  40 mg Oral Daily  . sodium bicarbonate  650 mg Oral BID  . tamsulosin  0.4 mg Oral QPC supper  . vitamin B-12  500 mcg Oral Daily   Continuous Infusions: . sodium chloride Stopped (05/12/20 1641)  . thiamine injection 250 mg (05/15/20 0920)     LOS: 15 days   Time spent: 65mins Greater than 50% of this time was spent in counseling, explanation of diagnosis, planning of further management, and coordination of care.  I have personally reviewed and interpreted on  05/15/2020 daily labs, I reviewed all nursing notes, pharmacy notes, consultant notes,  vitals, pertinent old records  I have discussed plan of care as described above with RN , patient and family on 05/15/2020  Voice Recognition /Dragon  dictation system was used to create this note, attempts have been made to correct errors. Please contact the author with questions and/or clarifications.   Florencia Reasons, MD PhD FACP [facility]  Available via Epic secure chat 7am-7pm for nonurgent issues Please page for urgent issues To page the attending provider between 7A-7P or the covering provider during after hours 7P-7A, please log into the web site [email or web address] and access using universal [place] password for that web site. If you do not have the password, please call the hospital operator.    05/15/2020, 1:58 PM

## 2020-05-15 NOTE — Progress Notes (Signed)
Physical Therapy Treatment Patient Details Name: Jeffrey Acosta MRN: [id number] DOB: [birth year] Today's Date: 05/15/2020    History of Present Illness 70 yo male presenting from home after a fall at home. 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 cerebral angiogram on 05/14/2020 PMH includes: alcohol use, bipolar affective disorder,  hypothyroidism, atrial tachycardia.    PT Comments    Pt has made great improvement toward goals.  Emphasis on gait stability, balance challenge and working on changes of speeds.  DGI 21/24 shows lower risk of falls.   Follow Up Recommendations  Home health PT;Supervision/Assistance - 24 hour     Equipment Recommendations  Rolling walker with 5" wheels;3in1 (PT);Other (comment) (RW and 3 in 1 received and lost within the hospital with pt transfer to another room.)    Recommendations for Other Services       Precautions / Restrictions Precautions Precautions: Fall;Other (comment) Restrictions Weight Bearing Restrictions: No    Mobility  Bed Mobility Overal bed mobility: Modified Independent                Transfers Overall transfer level: Modified independent                  Ambulation/Gait Ambulation/Gait assistance: Supervision Gait Distance (Feet): 500 Feet Assistive device: None Gait Pattern/deviations: Step-through pattern Gait velocity: decr Gait velocity interpretation: 1.31 - 2.62 ft/sec, indicative of limited community ambulator General Gait Details: mild instability on occassion, decreasing pt's ability to attain age appropriate gait speeds, but pt handles moderate challenge without significant deviation.   Stairs Stairs: Yes Stairs assistance: Min guard;Min assist Stair Management: One rail Right;Alternating pattern;Forwards Number of Stairs: 10 General stair comments: 1 significant deviation/stumble, but then pt continued with rail and no further incident with  only mild deviation.   Wheelchair Mobility    Modified Rankin (Stroke Patients Only) Modified Rankin (Stroke Patients Only) Modified Rankin: Moderate disability     Balance Overall balance assessment: Needs assistance   Sitting balance-Leahy Scale: Good     Standing balance support: No upper extremity supported Standing balance-Leahy Scale: Good                 High Level Balance Comments: pt demonstrate fall risk with single leg standing task Standardized Balance Assessment Standardized Balance Assessment : Dynamic Gait Index   Dynamic Gait Index Level Surface: Normal Change in Gait Speed: Mild Impairment Gait with Horizontal Head Turns: Normal Gait with Vertical Head Turns: Normal Gait and Pivot Turn: Normal Step Over Obstacle: Normal Step Around Obstacles: Mild Impairment Steps: Mild Impairment Total Score: 21      Cognition Arousal/Alertness: Awake/alert Behavior During Therapy: WFL for tasks assessed/performed Overall Cognitive Status: Impaired/Different from baseline Area of Impairment: Safety/judgement                         Safety/Judgement: Decreased awareness of safety;Decreased awareness of deficits     General Comments: pt asking wife "do i call for them now?" pt asking if he should call RN staff for help because OT was asking him to get up OOB. Pt showing recall of the need to get help but not aware OT is staff is allowed to move him      Exercises      General Comments General comments (skin integrity, edema, etc.): lower fall risk      Pertinent Vitals/Pain Pain Assessment: No/denies pain    Home Living  Prior Function            PT Goals (current goals can now be found in the care plan section) Acute Rehab PT Goals Patient Stated Goal: to go home PT Goal Formulation: With patient Time For Goal Achievement: 05/20/20 Potential to Achieve Goals: Good Progress towards PT goals:  Progressing toward goals    Frequency    Min 4X/week      PT Plan Discharge plan needs to be updated    Co-evaluation              AM-PAC PT "6 Clicks" Mobility   Outcome Measure  Help needed turning from your back to your side while in a flat bed without using bedrails?: None Help needed moving from lying on your back to sitting on the side of a flat bed without using bedrails?: None Help needed moving to and from a bed to a chair (including a wheelchair)?: None Help needed standing up from a chair using your arms (e.g., wheelchair or bedside chair)?: None Help needed to walk in hospital room?: None Help needed climbing 3-5 steps with a railing? : A Little 6 Click Score: 23    End of Session   Activity Tolerance: Patient tolerated treatment well Patient left: in bed;with call bell/phone within reach;with family/visitor present Nurse Communication: Mobility status PT Visit Diagnosis: Unsteadiness on feet (R26.81)     Time: 0623-7628 PT Time Calculation (min) (ACUTE ONLY): 21 min  Charges:  $Gait Training: 8-22 mins                     05/15/2020  Ginger Carne., PT Acute Rehabilitation Services [phone]  (pager) [phone]  (office)   Jeffrey Acosta 05/15/2020, 5:46 PM

## 2020-05-16 ENCOUNTER — Other Ambulatory Visit (HOSPITAL_COMMUNITY): Payer: Self-pay | Admitting: Internal Medicine

## 2020-05-16 DIAGNOSIS — I10 Essential (primary) hypertension: Secondary | ICD-10-CM | POA: Diagnosis not present

## 2020-05-16 LAB — CBC
HCT: 35.4 % — ABNORMAL LOW (ref 39.0–52.0)
Hemoglobin: 12.7 g/dL — ABNORMAL LOW (ref 13.0–17.0)
MCH: 34.4 pg — ABNORMAL HIGH (ref 26.0–34.0)
MCHC: 35.9 g/dL (ref 30.0–36.0)
MCV: 95.9 fL (ref 80.0–100.0)
Platelets: 346 10*3/uL (ref 150–400)
RBC: 3.69 MIL/uL — ABNORMAL LOW (ref 4.22–5.81)
RDW: 12.5 % (ref 11.5–15.5)
WBC: 7.1 10*3/uL (ref 4.0–10.5)
nRBC: 0 % (ref 0.0–0.2)

## 2020-05-16 LAB — BASIC METABOLIC PANEL
Anion gap: 12 (ref 5–15)
BUN: 14 mg/dL (ref 8–23)
CO2: 21 mmol/L — ABNORMAL LOW (ref 22–32)
Calcium: 8.6 mg/dL — ABNORMAL LOW (ref 8.9–10.3)
Chloride: 106 mmol/L (ref 98–111)
Creatinine, Ser: 1.17 mg/dL (ref 0.61–1.24)
GFR, Estimated: >60 mL/min (ref >60)
Glucose, Bld: 87 mg/dL (ref 70–99)
Potassium: 3.1 mmol/L — ABNORMAL LOW (ref 3.5–5.1)
Sodium: 139 mmol/L (ref 135–145)

## 2020-05-16 LAB — MAGNESIUM: Magnesium: 1.7 mg/dL (ref 1.7–2.4)

## 2020-05-16 MED ORDER — POTASSIUM CHLORIDE CRYS ER 20 MEQ PO TBCR
40.0000 meq | EXTENDED_RELEASE_TABLET | Freq: Once | ORAL | Status: AC
  Administered 2020-05-16: 40 meq via ORAL
  Filled 2020-05-16: qty 2

## 2020-05-16 MED ORDER — FOLIC ACID 1 MG PO TABS: 1.0000 mg | ORAL_TABLET | Freq: Every day | ORAL | 0 refills | Status: DC

## 2020-05-16 MED ORDER — METOPROLOL SUCCINATE ER 25 MG PO TB24
25.0000 mg | ORAL_TABLET | Freq: Every day | ORAL | 0 refills | Status: DC
Start: 2020-05-16 — End: 2020-07-03

## 2020-05-16 MED ORDER — NIMODIPINE 30 MG PO CAPS: 60.0000 mg | ORAL_CAPSULE | ORAL | 0 refills | Status: DC

## 2020-05-16 MED ORDER — METOPROLOL SUCCINATE ER 100 MG PO TB24: 100.0000 mg | ORAL_TABLET | Freq: Every day | ORAL | Status: DC

## 2020-05-16 MED ORDER — SODIUM BICARBONATE 650 MG PO TABS: 650.0000 mg | ORAL_TABLET | Freq: Two times a day (BID) | ORAL | 0 refills | Status: DC

## 2020-05-16 MED ORDER — VITAMIN B-1 250 MG PO TABS: 250.0000 mg | ORAL_TABLET | Freq: Every day | ORAL | 0 refills | Status: DC

## 2020-05-16 MED FILL — VITAMIN B-1 100 MG TABS: 100 | 30 days supply | Qty: 30 | Fill #0

## 2020-05-16 MED FILL — SODIUM BICARBONATE 650 MG T: 650 | 7 days supply | Qty: 14 | Fill #0

## 2020-05-16 MED FILL — niMODipine 30 MG CAPS: 30 | 18 days supply | Qty: 216 | Fill #0

## 2020-05-16 MED FILL — METOPROLOL SUCCINATE ER 25: 25 | 18 days supply | Qty: 18 | Fill #0

## 2020-05-16 MED FILL — FOLIC ACID 1 MG TABS: 1 | 30 days supply | Qty: 30 | Fill #0

## 2020-05-16 NOTE — TOC Transition Note (Signed)
Transition of Care [facility]) - CM/SW Discharge Note   Patient Details  Name: JEDEDIAH NODA MRN: [id number] Date of Birth: 1950-09-07  [facility] CM/SW Contact:  Pollie Friar, RN Phone Number: 05/16/2020, 11:04 AM   Clinical Narrative:    Pt discharging home with his spouse and outpatient therapy. Recommendations are for [facility] services but patient and his wife prefer outpatient therapy. Orders in Epic and information on the AVS.  3 in 1 and walker at the bedside for home.  Wife able to provide supervision at home and transportation to home.   Final next level of care: OP Rehab Barriers to Discharge: No Barriers Identified   Patient Goals and CMS Choice   CMS Medicare.gov Compare Post Acute Care list provided to:: Patient Choice offered to / list presented to : [facility]  Discharge Placement                       Discharge Plan and Services   Discharge Planning Services: CM Consult            DME Arranged: 3-N-1,Walker rolling DME Agency: [employer] Date DME Agency Contacted: 05/04/20 Time DME Agency Contacted: [phone] Representative spoke with at DME Agency: [place] ([place]) Interventions     Readmission Risk Interventions No flowsheet data found.

## 2020-05-16 NOTE — Progress Notes (Signed)
Physical Therapy Treatment Patient Details Name: Jeffrey Acosta MRN: [id number] DOB: 1950-06-14 Today's Date: 05/16/2020    History of Present Illness 70 yo male presenting from home after a fall at home. 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 cerebral angiogram on 05/14/2020 PMH includes: alcohol use, bipolar affective disorder,  hypothyroidism, atrial tachycardia.    PT Comments    Pt continuing to make progress, ambulating an increased distance of ~1050 ft without AD with only supervision and navigating 10 stairs of different heights with use of bil rails and supervision this date. However, pt does display some safety awareness deficits as he displays decreased L foot clearance with swing and 1 minor LOB when turning quickly, pivoting on foot, and initially denying any issues. Pt did acknowledge and correct these deviations when cued to increase safety though. Pt educated on taking several steps rather than pivoting on foot to turn at home, allow wife to do all outdoor activity initially as bad weather arrives to avoid falls, and to slow down and focus on clearing feet with carpets and door frames for safe mobility at home. Will continue to follow acutely. Current recommendations remain appropriate.   Follow Up Recommendations  Home health PT;Supervision/Assistance - 24 hour     Equipment Recommendations  Rolling walker with 5" wheels;3in1 (PT);Other (comment) (RW and 3 in 1 received and lost withing the hospital with pt transfer to another room.)    Recommendations for Other Services       Precautions / Restrictions Precautions Precautions: Fall;Other (comment) Restrictions Weight Bearing Restrictions: No    Mobility  Bed Mobility Overal bed mobility: Independent       Supine to sit: Independent Sit to supine: Independent   General bed mobility comments: Pt able to transition supine <> sit EOB with bed flat and no use of  rails safely and in appropriate time frame.  Transfers Overall transfer level: Independent   Transfers: Sit to/from Stand Sit to Stand: Independent         General transfer comment: Pt able to come to stand safely in appropriate time frame. No LOB.  Ambulation/Gait Ambulation/Gait assistance: Supervision Gait Distance (Feet): 1050 Feet Assistive device: None Gait Pattern/deviations: Step-through pattern;Decreased dorsiflexion - left Gait velocity: decr Gait velocity interpretation: >2.62 ft/sec, indicative of community ambulatory General Gait Details: mild instability on occassion, primarily with turning quickly. Educated pt to take several steps rather than pivoting on foot due to minor LOB with supervision to recover 1x, success. Pt abel to inc gait speed and turn head L and R without LOB. Decreased L dorsiflexion with swing, thus provided visual and verbal cues to correct with momentary success. Educated pt to take time and focus on clearing foot with carpets and door frames at home for safety.   Stairs Stairs: Yes Stairs assistance: Supervision Stair Management: One rail Right;Alternating pattern;Forwards;One rail Left Number of Stairs: 10 (4 standard stair height, 6 shorter stair height) General stair comments: Utilizing bil rails with reciprocal gait pattern quickly ascending and descending, supervision for safety. 1 minor LOB with quick pivot turn to return to stairs, supervision for recovery.   Wheelchair Mobility    Modified Rankin (Stroke Patients Only) Modified Rankin (Stroke Patients Only) Pre-Morbid Rankin Score: No symptoms Modified Rankin: Moderate disability     Balance Overall balance assessment: Needs assistance Sitting-balance support: No upper extremity supported;Feet supported Sitting balance-Leahy Scale: Good Sitting balance - Comments: Pt with good sitting balance EOB.  Standing balance support: No upper extremity supported;During functional  activity Standing balance-Leahy Scale: Good Standing balance comment: No UE support or external support.                            Cognition Arousal/Alertness: Awake/alert Behavior During Therapy: WFL for tasks assessed/performed Overall Cognitive Status: Impaired/Different from baseline Area of Impairment: Safety/judgement                         Safety/Judgement: Decreased awareness of safety;Decreased awareness of deficits     General Comments: Pt turning quickly and denying LOB, requiring cues to acknowledge and correct for future instances. A&Ox4.      Exercises      General Comments General comments (skin integrity, edema, etc.): SOB with increased gait speed but HR in 110s bpm      Pertinent Vitals/Pain Pain Assessment: No/denies pain Pain Intervention(s): Monitored during session    Home Living                      Prior Function            PT Goals (current goals can now be found in the care plan section) Acute Rehab PT Goals Patient Stated Goal: to go home PT Goal Formulation: With patient Time For Goal Achievement: 05/20/20 Potential to Achieve Goals: Good Progress towards PT goals: Progressing toward goals    Frequency    Min 4X/week      PT Plan Current plan remains appropriate    Co-evaluation              AM-PAC PT "6 Clicks" Mobility   Outcome Measure  Help needed turning from your back to your side while in a flat bed without using bedrails?: None Help needed moving from lying on your back to sitting on the side of a flat bed without using bedrails?: None Help needed moving to and from a bed to a chair (including a wheelchair)?: None Help needed standing up from a chair using your arms (e.g., wheelchair or bedside chair)?: None Help needed to walk in hospital room?: None Help needed climbing 3-5 steps with a railing? : A Little 6 Click Score: 23    End of Session Equipment Utilized During  Treatment: Gait belt Activity Tolerance: Patient tolerated treatment well Patient left: in bed;with call bell/phone within reach;with bed alarm set   PT Visit Diagnosis: Unsteadiness on feet (R26.81);Other abnormalities of gait and mobility (R26.89)     Time: 1884-1660 PT Time Calculation (min) (ACUTE ONLY): 14 min  Charges:  $Gait Training: 8-22 mins                     Moishe Spice, PT, DPT Acute Rehabilitation Services  Pager: [phone] Office: [place] 05/16/2020, 9:52 AM

## 2020-05-16 NOTE — Discharge Summary (Signed)
Discharge Summary  Jeffrey Acosta [id number] DOB: [birth year]  PCP: Colon Branch, MD  Admit date: 04/30/2020 Discharge date: 05/16/2020  Time spent: 33mins, more than 50% time spent on coordination of care.   Recommendations for Outpatient Follow-up:  1. F/u with PCP within a week  for hospital discharge follow up, repeat cbc/bmp at follow up 2. F/u with cardiology for outpatient event monitoring, message sent to cardiology, chart cced to Dr Audie Box 3. F/u with neurosurgery and neurology 4. Home health   Discharge Diagnoses:  Active Hospital Problems   Diagnosis Date Noted  . Subarachnoid hemorrhage ([place]) 04/30/2020  . Benign essential HTN 05/06/2020  . Hypothyroidism 08/28/2014  . Bipolar I disorder ([place]) 06/19/2011  . Alcohol abuse 05/16/2010    Resolved Hospital Problems  No resolved problems to display.    Discharge Condition: stable  Diet recommendation: heart healthy  Filed Weights   05/13/20 0500 05/14/20 0500 05/16/20 0500  Weight: 83.1 kg 83 kg 85.8 kg    History of present illness: (Per critical care admission notes)  history of alcohol use, hypothyroidism, atrial tachycardia, Barrett's esophagus.  He experienced a collapse at home on 1/4, was found very quickly by his wife although should she did not see the actual fall.  He was unconscious but there was no evidence of seizure type activity.  He had slowly improving mental status on transportation to the ED, complained of severe headache.  Head CT showed diffuse SAH.  He will be admitted to the ICU for close neurological monitoring.  Neurosurgery consulted.  He does not have any memory of the events described above.  He complains of significant headache    Head CT 1/4 moderate to large acute SAH with extra-axial hemorrhage most notable anterior to the brainstem and in the suprasellar cistern.  Suspect aneurysmal rupture.  Additional focus extra-axial blood in the posterior fossa.  Small amount bilateral  intraventricular and fourth ventricular blood.  Also noted was a suspected left anterior subdural hygroma.   Neurosurgery consulted no surgery was indicated.  Reportedly, he left his hospital room on 05/11/2020 to go and smoke a cigarette outside. Unfortunately, he was found down, outside of the hospital.He was hypothermic and unresponsive. He was transferred to the neuro ICU. MRI brain showed small acute infarcts in the left basal ganglia, left periatrial temporal lobe and high bilateral frontoparietal regions.  He underwent diagnostic cerebral angiogram on 05/14/2020. No intracranial aneurysms, AVMs or fistulas were seen but there was moderate to severe vasospasm of bilateral anterior circulation, and mild spasm of the left PCA territory. Case was discussed with neurologist, Dr. Leonie Man, recommended 21-day course of nimodipine  for vasospasms.  Hospital Course:  Principal Problem:   Subarachnoid hemorrhage (HCC) Active Problems:   Alcohol abuse   Bipolar I disorder ([place])   Hypothyroidism   Benign essential HTN   Acute stroke (Subarachnoid hemorrhage with vasospasm,small acute infarcts in the left basal ganglia, left periatrial temporal lobe and right bilateral frontoparietal regions): -s/pcerebral angiogram on 05/14/2020.No intracranial aneurysms, AVMs or fistulas were seen but there was moderate to severe vasospasm of bilateral anterior circulation, and mild spasm of the left PCA territory. -Neurology recommend continuenimodipinefor total of 21 days.  Neurology recommend Discontinue statin since statin is contraindicated in the setting of ICH -EEG showed moderately diffuse encephalopathy but no seizures or epileptiform discharges were noted. He was briefly treated with keppra, keppra was discontinued  He has improved, he desired to go home, PT OT recommended home health  he  is to follow up with neurology and neurosurgery   Hypertension/history of atrial tachycardia He started  on the nimodipine for vasospasm, last dose on 2/7 Metoprolol xl dose decreased to 25mg  daily while he is on nimodipine, dose increase back to home dose metoprolol xl 100mg  after finish taking nimodipine F/u with pcp and cardiology I have messaged cardiology office to arrange outpatient event monitor and cardiology follow up, he was found down twice once at home, once outside of the hospital, presumably from cerebral cerebral vessel vasospasm, but will request outpatient cardiac event monitoring for completeness    Metabolic acidosis, treated with  sodium bicarb in the hospital, discharged on 28more days of sodium bicarb supplement F/u with pcp and repeat bmp at follow up  AKI; resolved  Electrolyte abnormalities: Hyponatremia, improved Hypokalemia, replaced Hypomagnesemia, replaced repeat bmp by pcp  Thrombocytopenia, resolved  Alcohol use disorder Alcohol intoxication on presentation, alcohollevel 320 on admission He received high dose iv thiamine treatment in the hospital per neurology recommendation, he is discharged on oral thiamine , folic acid, [id number] Does not appear to have significant withdrawal syndrome Alcohol cessation education provided F/u with pcp  Gout, stable, resume allopurinol  BPH, stable resume Flomax  Hypothyroidism: Continue Synthroid    DVT prophylaxis while in the hospital: SCDs Start: 04/30/20 2354  Code Status: Full Family Communication: Wife at bedside on 1/19 Disposition:  Home with home health    Consultants:  Neurologist  Intensivist  Neurosurgeon  Procedures:  Cerebral angiogram on 05/14/2020   Discharge Exam: BP 126/81 (BP Location: Right Arm)   Pulse 88   Temp (!) 97.5 F (36.4 C) (Oral)   Resp 16   Ht 6\' 1"  (1.854 m)   Wt 85.8 kg   SpO2 100%   BMI 24.96 kg/m   General: NAD Cardiovascular: RRR Respiratory: CTABL  Discharge Instructions You were cared for by a hospitalist during your hospital stay. If  you have any questions about your discharge medications or the care you received while you were in the hospital after you are discharged, you can call the unit and asked to speak with the hospitalist on call if the hospitalist that took care of you is not available. Once you are discharged, your primary care physician will handle any further medical issues. Please note that NO REFILLS for any discharge medications will be authorized once you are discharged, as it is imperative that you return to your primary care physician (or establish a relationship with a primary care physician if you do not have one) for your aftercare needs so that they can reassess your need for medications and monitor your lab values.  Discharge Instructions    Ambulatory referral to Occupational Therapy   Complete by: As directed    Ambulatory referral to Physical Therapy   Complete by: As directed    Call MD for:  difficulty breathing, headache or visual disturbances   Complete by: As directed    Call MD for:  extreme fatigue   Complete by: As directed    Call MD for:  persistant dizziness or light-headedness   Complete by: As directed    Call MD for:  persistant nausea and vomiting   Complete by: As directed    Call MD for:  temperature >100.4   Complete by: As directed    Diet - low sodium heart healthy   Complete by: As directed    Increase activity slowly   Complete by: As directed      Allergies  as of 05/16/2020      Reactions   Omnipaque [iohexol] Hives    Desc: developed hives after injection; resolved with 50 mg benadryl given to pt.      Medication List    STOP taking these medications   aspirin 81 MG tablet   atorvastatin 20 MG tablet Commonly known as: LIPITOR     TAKE these medications   allopurinol 300 MG tablet Commonly known as: ZYLOPRIM Take 1 tablet (300 mg total) by mouth daily.   b complex vitamins capsule Take 1 capsule by mouth daily.   esomeprazole 40 MG capsule Commonly  known as: NEXIUM Take 1 capsule (40 mg total) by mouth daily before breakfast.   folic acid 1 MG tablet Commonly known as: FOLVITE Take 1 tablet (1 mg total) by mouth daily.   indomethacin 25 MG capsule Commonly known as: INDOCIN Take 25 mg by mouth every 12 (twelve) hours as needed (back pain).   lamoTRIgine 25 MG tablet Commonly known as: LAMICTAL Take 25 mg by mouth daily.   levothyroxine 50 MCG tablet Commonly known as: SYNTHROID Take 1 tablet (50 mcg total) by mouth daily before breakfast.   metoprolol succinate 100 MG 24 hr tablet Commonly known as: TOPROL-XL Take 1 tablet (100 mg total) by mouth daily. Take with or immediately following a meal.  Start taking from 2/8 What changed: additional instructions   metoprolol succinate 25 MG 24 hr tablet Commonly known as: TOPROL-XL Take 1 tablet (25 mg total) by mouth daily for 18 days. What changed: You were already taking a medication with the same name, and this prescription was added. Make sure you understand how and when to take each.   multivitamin with minerals Tabs tablet Take 1 tablet by mouth daily.   naltrexone 50 MG tablet Commonly known as: DEPADE Take 50 mg by mouth daily.   niMODipine 30 MG capsule Commonly known as: NIMOTOP Take 2 capsules (60 mg total) by mouth every 4 (four) hours for 18 days.   QUEtiapine 400 MG tablet Commonly known as: SEROQUEL Take 800 mg by mouth at bedtime.   sodium bicarbonate 650 MG tablet Take 1 tablet (650 mg total) by mouth 2 (two) times daily for 7 days.   tamsulosin 0.4 MG Caps capsule Commonly known as: FLOMAX Take 1 capsule (0.4 mg total) by mouth daily after supper.   timolol 0.5 % ophthalmic solution Commonly known as: TIMOPTIC 1 drop 2 (two) times daily.   vitamin B-1 250 MG tablet Take 1 tablet (250 mg total) by mouth daily.   vitamin B-12 500 MCG tablet Commonly known as: CYANOCOBALAMIN Take 500 mcg by mouth daily.   Vitamin D 50 MCG (2000 UT)  tablet Take 2,000 Units by mouth daily.            Durable Medical Equipment  (From admission, onward)         Start     Ordered   05/08/20 1426  For home use only DME 3 n 1  Once        05/08/20 1427   05/08/20 1425  For home use only DME Walker rolling  Once       Question Answer Comment  Walker: With 5 Inch Wheels   Patient needs a walker to treat with the following condition Gait disorder      05/08/20 1427   05/03/20 1625  For home use only DME 3 n 1  Once        05/03/20  1624   05/03/20 1625  For home use only DME Walker rolling  Once       Question Answer Comment  Walker: With [place]   Patient needs a walker to treat with the following condition Weakness      05/03/20 1624         Allergies  Allergen Reactions  . Omnipaque [Iohexol] Hives     Desc: developed hives after injection; resolved with 50 mg benadryl given to pt.     Follow-up Information    [facility] up.   Specialty: Rehabilitation Why: The outpatient therapy will contact you for the first appointment Contact information: [address] [place]       Kristeen Miss, MD. Schedule an appointment as soon as possible for a visit in 2 week(s).   Specialty: Neurosurgery Contact information: [address] [place] [address] [phone]        Colon Branch, MD. Schedule an appointment as soon as possible for a visit in 1 week(s).   Specialty: Internal Medicine Why: Hospital discharge follow-up, repeat CBC/BMP at hospital follow-up Contact information: [address] [name] [address] [phone]        O'Neal, Pierson Thomas, MD Follow up.   Specialties: Internal Medicine, Cardiology, Radiology Why: cardiology office will arrange outpatient cardiac monitoring and cardiology follow up appointment, please call cardiology office if  you do not hear from them in three business days Contact information: [name] [address] [phone]        Garvin Fila, MD Follow up in 1 month(s).   Specialties: Neurology, Radiology Contact information: [address] [place] [address] [phone]                The results of significant diagnostics from this hospitalization (including imaging, microbiology, ancillary and laboratory) are listed below for reference.    Significant Diagnostic Studies: EEG  Result Date: 05/11/2020 Lora Havens, MD     05/11/2020 10:02 AM Patient Name: Jeffrey Acosta MRN: [id number] Epilepsy Attending: Lora Havens Referring Physician/Provider: Dr Margaretha Seeds Date: 1/05/01/2020 Duration: 26.46 mins Patient history: 70 year old male initially admitted to the hospital 04/30/2020 following a subarachnoid hemorrhage. His history is significant for ETOH abuse, hyperlipidemia, GERD, and bipolar disorder. Early morning 05/11/2020, Mr. Clemmons wandered away from his room and was found outside of the hospital unresponsive and hypothermic concerning for ischemic stroke secondary to vasospasm versus seizure. EEG to evaluate for seizure Level of alertness:  lethargic AEDs during EEG study: None Technical aspects: This EEG study was done with scalp electrodes positioned according to the 10-20 International system of electrode placement. Electrical activity was acquired at a sampling rate of 500Hz  and reviewed with a high frequency filter of 70Hz  and a low frequency filter of 1Hz . EEG data were recorded continuously and digitally stored. Description: EEG showed continuous generalized 3 to 6 Hz theta-delta slowing.  Hyperventilation and photic stimulation were not performed.   ABNORMALITY -Continuous slow, generalized IMPRESSION: This study is suggestive of moderate diffuse encephalopathy, nonspecific etiology. No seizures or epileptiform discharges were seen throughout  the recording. Lora Havens   CT Angio Head W or Wo Contrast  Result Date: 05/01/2020 CLINICAL DATA:  Initial evaluation for acute subarachnoid hemorrhage, evaluate for aneurysm. EXAM: CT ANGIOGRAPHY HEAD AND NECK TECHNIQUE: Multidetector CT imaging of the head and neck was  performed using the standard protocol during bolus administration of intravenous contrast. Multiplanar CT image reconstructions and MIPs were obtained to evaluate the vascular anatomy. Carotid stenosis measurements (when applicable) are obtained utilizing NASCET criteria, using the distal internal carotid diameter as the denominator. CONTRAST:  4mL OMNIPAQUE IOHEXOL 350 MG/ML SOLN COMPARISON:  Prior head CT from 04/30/2020. FINDINGS: CTA NECK FINDINGS Aortic arch: Aortic arch of normal caliber with normal 3 vessel morphology. Mild atheromatous change within the arch itself. No hemodynamically significant stenosis about the origin of the great vessels. Right carotid system: Right CCA patent from its origin to the bifurcation without stenosis. Scattered calcified plaque about the right bifurcation/proximal right ICA with no more than 40% stenosis by NASCET criteria. Right ICA widely patent distally to the skull base without stenosis, dissection or occlusion. Left carotid system: Left CCA patent from its origin to the bifurcation without stenosis. Scattered atheromatous plaque about the left bifurcation/proximal left ICA without hemodynamically significant stenosis. Left ICA widely patent distally to the skull base without stenosis, dissection or occlusion. Vertebral arteries: Both vertebral arteries arise from the subclavian arteries. No proximal subclavian artery stenosis. Right vertebral artery slightly dominant. Vertebral arteries patent within the neck without stenosis, dissection or occlusion. Skeleton: Age indeterminate wedging seen at the superior endplates of C7, T3, and T4. No other visible osseous abnormality. Moderate multilevel  cervical spondylosis without high-grade stenosis. Other neck: No other acute soft tissue abnormality within the neck. No mass or adenopathy. Upper chest: Visualized upper chest demonstrates no acute finding. Review of the MIP images confirms the above findings CTA HEAD FINDINGS Anterior circulation: Petrous segments widely patent bilaterally. Scattered atheromatous plaque within the carotid siphons without hemodynamically significant stenosis or other abnormality. ICA termini well perfused. A1 segments patent bilaterally. Left A1 hypoplastic. Normal anterior communicating artery complex. Anterior cerebral arteries patent to their distal aspects without stenosis. No M1 stenosis or occlusion. Normal MCA bifurcations. Distal MCA branches well perfused and fairly symmetric. Posterior circulation: Both vertebral arteries patent to the vertebrobasilar junction without stenosis. Both PICA origins patent and normal. Basilar patent to its distal aspect without stenosis. Superior cerebellar arteries patent bilaterally. Both PCAs primarily supplied via the basilar. PCAs well perfused to their distal aspects without stenosis. Venous sinuses: Grossly patent allowing for timing the contrast bolus. Anatomic variants: Hypoplastic left A1 segment. No visible aneurysm or other vascular abnormality evident by CTA. Review of the MIP images confirms the above findings IMPRESSION: 1. Negative intracranial CTA. No visible aneurysm or other vascular abnormality to explain patient's intracranial hemorrhage. 2. No large vessel occlusion. 3. Mild for age atheromatous change about the carotid bifurcations and carotid siphons without hemodynamically significant stenosis. 4. Age-indeterminate compression deformities at the superior endplates of C7, T3, and T4. Correlation with physical exam recommended. Findings could be further assessed with dedicated MRI as clinically warranted. Electronically Signed   By: Jeannine Boga M.D.   On:  05/01/2020 02:36   DG Elbow 2 Views Right  Result Date: 05/11/2020 CLINICAL DATA:  70 year old male status post fall with pain. EXAM: RIGHT ELBOW - 2 VIEW COMPARISON:  None. FINDINGS: Bone mineralization is within normal limits. Preserved joint spaces and alignment. No evidence of joint effusion. Radial head appears intact. No acute osseous abnormality identified. No discrete soft tissue injury. IMPRESSION: Negative. Electronically Signed   By: Genevie Ann M.D.   On: 05/11/2020 05:33   DG Knee 1-2 Views Right  Result Date: 05/11/2020 CLINICAL DATA:  70 year old male status post fall with pain. EXAM:  RIGHT KNEE - 1-2 VIEW COMPARISON:  None. FINDINGS: No joint effusion on cross-table lateral view. Extensive calcified atherosclerosis in the visible right lower extremity. Preserved joint spaces and alignment at the right knee. Patella intact. Bone mineralization is within normal limits. No acute osseous abnormality identified. IMPRESSION: 1. No acute fracture or dislocation identified about the right knee 2. Advanced calcified peripheral vascular disease. Electronically Signed   By: Genevie Ann M.D.   On: 05/11/2020 05:29   DG Abd 1 View  Result Date: 05/12/2020 CLINICAL DATA:  NG tube placement. EXAM: ABDOMEN - 1 VIEW COMPARISON:  05/12/2020 at 8:45 a.m. FINDINGS: Nasogastric tube extends to the level of the GE junction where it curls with the tip extending back superiorly projecting within the distal esophagus. Normal bowel gas pattern. IMPRESSION: Nasal/orogastric tube has its tip projecting superiorly, within the distal esophagus. Tube will need to be repositioned. Electronically Signed   By: Lajean Manes M.D.   On: 05/12/2020 15:28   DG Abd 1 View  Result Date: 05/12/2020 CLINICAL DATA:  70 year old male NG tube placement, repositioning. EXAM: ABDOMEN - 1 VIEW COMPARISON:  0806 hours today. FINDINGS: Portable AP semi upright view at 0845 hours. Enteric tube has been pulled back slightly and appears in  satisfactory configuration in the left upper quadrant. Position compatible with placement into the proximal stomach, side hole the level of the proximal gastric body. Negative lung bases. Stable bowel gas pattern. Stable visualized osseous structures. Chronic left lower rib fractures. IMPRESSION: Enteric tube reposition with satisfactory configuration now. Side hole at the level of the proximal gastric body. Electronically Signed   By: Genevie Ann M.D.   On: 05/12/2020 09:04   DG Abd 1 View  Result Date: 05/12/2020 CLINICAL DATA:  Enteric tube placement EXAM: ABDOMEN - 1 VIEW COMPARISON:  05/11/2020 abdominal radiograph FINDINGS: Enteric tube coils in the distal stomach with possible kink at the level of the side port with tip in the proximal stomach. Borderline dilated small bowel loops throughout the abdomen up to 3.0 cm diameter. No evidence of pneumatosis or pneumoperitoneum. No radiopaque nephrolithiasis. Mild lumbar spondylosis. IMPRESSION: 1. Enteric tube coils in the distal stomach with possible kink at the level of the side port with tip in the proximal stomach. Suggest repositioning. 2. Borderline dilated small bowel loops throughout the abdomen up to 3.0 cm diameter, cannot exclude partial distal small bowel obstruction or mild adynamic ileus. Electronically Signed   By: Ilona Sorrel M.D.   On: 05/12/2020 08:19   CT HEAD WO CONTRAST  Result Date: 05/11/2020 CLINICAL DATA:  70 year old with altered mental status. Found wandering. Fall with intracranial hemorrhage earlier this month. EXAM: CT HEAD WITHOUT CONTRAST TECHNIQUE: Contiguous axial images were obtained from the base of the skull through the vertex without intravenous contrast. COMPARISON:  Brain MRI 05/04/2020.  Head CT 05/06/2020 and earlier. FINDINGS: Brain: Chronic bilateral subdural hygromas. The CSF density portion of the collections appear stable and size and configuration. There are some superimposed hyperdense blood products within the  hygroma on the left (series 8, image 29), although decreased since 05/06/2020. Nearby left sylvian fissure and other bilateral basilar cistern subarachnoid hemorrhage has also significantly regressed from 05/06/2020, only trace residual. The largest area of residual subarachnoid hemorrhage is in the left posterior fossa lateral to the cerebellum on series 8, image 46, decreased. Intraventricular blood has resolved by CT.  No ventriculomegaly. Small volume para falcine subdural blood is also stable to regressed. No midline shift or significant intracranial  mass effect. No new new or increased areas of intracranial hemorrhage. Stable gray-white matter differentiation throughout the brain. No cortically based acute infarct identified. Vascular: Calcified atherosclerosis at the skull base. No suspicious intracranial vascular hyperdensity. Skull: Stable.  No fracture identified. Sinuses/Orbits: Visualized paranasal sinuses and mastoids are stable and well pneumatized. Other: Visualized orbits and scalp soft tissues are within normal limits. IMPRESSION: 1. Further regression of bilateral extra-axial hemorrhage since 05/06/2020, only small volume residual. Superimposed chronic bilateral subdural hygromas remain stable. Resolved intraventricular blood. 2. No significant intracranial mass effect. No new intracranial abnormality. Electronically Signed   By: Genevie Ann M.D.   On: 05/11/2020 05:19   CT HEAD WO CONTRAST  Result Date: 05/06/2020 CLINICAL DATA:  Subarachnoid hemorrhage. Headache. CT a negative for aneurysm. EXAM: CT HEAD WITHOUT CONTRAST TECHNIQUE: Contiguous axial images were obtained from the base of the skull through the vertex without intravenous contrast. COMPARISON:  CT head 05/02/2020.  MRI head 05/04/2020 FINDINGS: Brain: Interval improvement in subarachnoid hemorrhage which is most prominent in the basilar cisterns. There is also subarachnoid hemorrhage extending into the left sylvian fissure. There is a  small amount of subarachnoid hemorrhage in the subdural hygroma on the left which is unchanged. Bilateral subdural hygroma left greater than right unchanged. No midline shift. No new area of hemorrhage. No acute infarct or hydrocephalus. Previously noted ventricular hemorrhage no longer identified. Vascular: Negative for hyperdense vessel. Basilar obscured by subarachnoid hemorrhage. Skull: Negative Sinuses/Orbits: Paranasal sinuses clear.  Negative orbit. Other: None IMPRESSION: Continued improvement in subarachnoid hemorrhage. No new area of hemorrhage or infarction identified. Electronically Signed   By: Franchot Gallo M.D.   On: 05/06/2020 11:22   CT HEAD WO CONTRAST  Result Date: 05/02/2020 CLINICAL DATA:  Stroke follow-up EXAM: CT HEAD WITHOUT CONTRAST TECHNIQUE: Contiguous axial images were obtained from the base of the skull through the vertex without intravenous contrast. COMPARISON:  Yesterday FINDINGS: Brain: Unchanged subarachnoid hemorrhage centered in the basal cisterns and along the left more than right sylvian fissure and cerebral convexities. Prominent extra-axial CSF density around the cerebral convexities without discrete subdural collection. Small volume intraventricular reflux seen at the atrium of the right lateral ventricle. The lateral ventricles measure 2-3 mm greater in diameter than compared to yesterday, without overt ventriculomegaly. Negative for infarct. Vascular: Negative Skull: Negative Sinuses/Orbits: Negative IMPRESSION: 1. Unchanged subarachnoid hemorrhage. 2. Minimal intraventricular reflux. The ventricles measure a few millimeters larger than yesterday, but no ventriculomegaly. Electronically Signed   By: Monte Fantasia M.D.   On: 05/02/2020 04:25   CT Head Wo Contrast  Result Date: 05/01/2020 CLINICAL DATA:  Headache and nausea, emesis, intracranial hemorrhage yesterday EXAM: CT HEAD WITHOUT CONTRAST TECHNIQUE: Contiguous axial images were obtained from the base of the  skull through the vertex without intravenous contrast. COMPARISON:  05/01/2020, 04/30/2020 FINDINGS: Brain: Subarachnoid hemorrhage is again noted within the posterior fossa, basilar cisterns, suprasellar region, anterior inter hemispheric region, and left sylvian fissure. Minimal intraventricular hemorrhage seen within the fourth ventricle and occipital horns of the lateral ventricles. There is a small focus of subarachnoid hemorrhage in the left temporoparietal region image 19, new since prior study. Otherwise no significant changes. No evidence of acute infarct. The lateral ventricles and midline structures are stable. No evidence of hydrocephalus. No mass effect. Vascular: No hyperdense vessel or unexpected calcification. Skull: Normal. Negative for fracture or focal lesion. Sinuses/Orbits: No acute finding. Other: None. IMPRESSION: 1. Small focus of new subarachnoid hemorrhage within the left temporoparietal region. Otherwise the  diffuse subarachnoid hemorrhage seen throughout the left sylvian fissure, suprasellar region, basilar cisterns, and posterior fossa is unchanged. 2. No evidence of acute infarct. 3. No mass effect. Electronically Signed   By: Randa Ngo M.D.   On: 05/01/2020 22:27   CT HEAD WO CONTRAST  Result Date: 04/30/2020 CLINICAL DATA:  Collapsed in bathroom EXAM: CT HEAD WITHOUT CONTRAST TECHNIQUE: Contiguous axial images were obtained from the base of the skull through the vertex without intravenous contrast. COMPARISON:  MRI 09/14/2014, CT brain 08/14/2014 FINDINGS: Brain: Moderate to large amount of acute subarachnoid/extra-axial hemorrhage, most heavily concentrated at the suprasellar cistern, and anterior to the brainstem. Extra-axial hemorrhage present within the posterior fossa, with moderate subarachnoid blood along the left temporal lobe. Small amount of inter hemispheric subarachnoid blood anteriorly with trace amount of acute hemorrhage along the left inferior frontal lobe.  Small amount of subarachnoid hemorrhage within the right middle cranial fossa. Trace hemorrhage within the fourth ventricle and layering within the posterior horns of the lateral ventricles. Mild to moderate atrophy. 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 Slight interval increase in lateral and third ventricular dilatation. Stable normal sized fourth ventricle. No midline shift. Vascular: Carotid vascular calcification. Skull: Normal. Negative for fracture or focal lesion. Sinuses/Orbits: No acute finding. Other: None IMPRESSION: 1. Moderate to large amount of acute subarachnoid /extra-axial hemorrhage, most heavily concentrated at the suprasellar cistern, and anterior to the brainstem. Findings felt consistent with ruptured aneurysm. Additional foci of extra-axial blood within the posterior fossa, left greater than right middle cranial fossa, and anterior inter hemispheric region. Small amount of bilateral intraventricular and fourth ventricular hemorrhage. Slight interval increase in size/dilatation of lateral and third ventricles compared to previous. 2. Atrophy.  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 Critical Value/emergent results were called by telephone at the time of interpretation on 04/30/2020 at 10:16 pm to provider [facility] , who verbally acknowledged these results. Electronically Signed   By: Donavan Foil M.D.   On: 04/30/2020 22:16   CT Angio Neck W and/or Wo Contrast  Result Date: 05/01/2020 CLINICAL DATA:  Initial evaluation for acute subarachnoid hemorrhage, evaluate for aneurysm. EXAM: CT ANGIOGRAPHY HEAD AND NECK TECHNIQUE: Multidetector CT imaging of the head and neck was performed using the standard protocol during bolus administration of intravenous contrast. Multiplanar CT image reconstructions and MIPs were obtained to evaluate the vascular anatomy. Carotid stenosis  measurements (when applicable) are obtained utilizing NASCET criteria, using the distal internal carotid diameter as the denominator. CONTRAST:  87mL OMNIPAQUE IOHEXOL 350 MG/ML SOLN COMPARISON:  Prior head CT from 04/30/2020. FINDINGS: CTA NECK FINDINGS Aortic arch: Aortic arch of normal caliber with normal 3 vessel morphology. Mild atheromatous change within the arch itself. No hemodynamically significant stenosis about the origin of the great vessels. Right carotid system: Right CCA patent from its origin to the bifurcation without stenosis. Scattered calcified plaque about the right bifurcation/proximal right ICA with no more than 40% stenosis by NASCET criteria. Right ICA widely patent distally to the skull base without stenosis, dissection or occlusion. Left carotid system: Left CCA patent from its origin to the bifurcation without stenosis. Scattered atheromatous plaque about the left bifurcation/proximal left ICA without hemodynamically significant stenosis. Left ICA widely patent distally to the skull base without stenosis, dissection or occlusion. Vertebral arteries: Both vertebral arteries arise from the subclavian arteries. No proximal subclavian artery stenosis. Right vertebral artery slightly dominant. Vertebral  arteries patent within the neck without stenosis, dissection or occlusion. Skeleton: Age indeterminate wedging seen at the superior endplates of C7, T3, and T4. No other visible osseous abnormality. Moderate multilevel cervical spondylosis without high-grade stenosis. Other neck: No other acute soft tissue abnormality within the neck. No mass or adenopathy. Upper chest: Visualized upper chest demonstrates no acute finding. Review of the MIP images confirms the above findings CTA HEAD FINDINGS Anterior circulation: Petrous segments widely patent bilaterally. Scattered atheromatous plaque within the carotid siphons without hemodynamically significant stenosis or other abnormality. ICA termini  well perfused. A1 segments patent bilaterally. Left A1 hypoplastic. Normal anterior communicating artery complex. Anterior cerebral arteries patent to their distal aspects without stenosis. No M1 stenosis or occlusion. Normal MCA bifurcations. Distal MCA branches well perfused and fairly symmetric. Posterior circulation: Both vertebral arteries patent to the vertebrobasilar junction without stenosis. Both PICA origins patent and normal. Basilar patent to its distal aspect without stenosis. Superior cerebellar arteries patent bilaterally. Both PCAs primarily supplied via the basilar. PCAs well perfused to their distal aspects without stenosis. Venous sinuses: Grossly patent allowing for timing the contrast bolus. Anatomic variants: Hypoplastic left A1 segment. No visible aneurysm or other vascular abnormality evident by CTA. Review of the MIP images confirms the above findings IMPRESSION: 1. Negative intracranial CTA. No visible aneurysm or other vascular abnormality to explain patient's intracranial hemorrhage. 2. No large vessel occlusion. 3. Mild for age atheromatous change about the carotid bifurcations and carotid siphons without hemodynamically significant stenosis. 4. Age-indeterminate compression deformities at the superior endplates of C7, T3, and T4. Correlation with physical exam recommended. Findings could be further assessed with dedicated MRI as clinically warranted. Electronically Signed   By: Jeannine Boga M.D.   On: 05/01/2020 02:36   CT Cervical Spine Wo Contrast  Result Date: 04/30/2020 CLINICAL DATA:  Found down, syncope, incontinent EXAM: CT CERVICAL SPINE WITHOUT CONTRAST TECHNIQUE: Multidetector CT imaging of the cervical spine was performed without intravenous contrast. Multiplanar CT image reconstructions were also generated. COMPARISON:  None. FINDINGS: Alignment: Alignment is grossly anatomic. Skull base and vertebrae: No acute fracture. No primary bone lesion or focal pathologic  process. Subarachnoid hemorrhage is seen at the basilar cisterns and foramen magnum. Please refer to separately reported head CT. Soft tissues and spinal canal: No prevertebral fluid or swelling. No visible canal hematoma. Moderate atherosclerosis at the carotid bifurcations. Disc levels: Severe spondylosis at C3-4, C4-5, and C5-6. Minimal symmetrical neural foraminal encroachment at those levels. Upper chest: Airway is patent.  Lung apices are clear. Other: Reconstructed images demonstrate no additional findings. IMPRESSION: 1. Multilevel cervical spondylosis.  No acute fracture. 2. Intracranial subarachnoid hemorrhage. Please refer to separately reported head CT exam. Electronically Signed   By: Randa Ngo M.D.   On: 04/30/2020 22:15   MR BRAIN WO CONTRAST  Addendum Date: 05/11/2020   ADDENDUM REPORT: 05/11/2020 18:15 ADDENDUM: Findings also discussed with Dr. Rory Percy at 6:14 p.m. Electronically Signed   By: Margaretha Sheffield MD   On: 05/11/2020 18:15   Addendum Date: 05/11/2020   ADDENDUM REPORT: 05/11/2020 18:09 ADDENDUM: Findings discussed with nurse Brook via telephone at 6:05 p.m. Electronically Signed   By: Margaretha Sheffield MD   On: 05/11/2020 18:09   Result Date: 05/11/2020 CLINICAL DATA:  Mental status change. EXAM: MRI HEAD WITHOUT CONTRAST TECHNIQUE: Multiplanar, multiecho pulse sequences of the brain and surrounding structures were obtained without intravenous contrast. COMPARISON:  CT head May 11, 2020 FINDINGS: Brain: There is redemonstrated small volume of parafalcine  subdural hemorrhage. Similar size of left greater than right hygromas with similar appearance of intermixed more acute hemorrhage within the left hygroma and in the left sylvian fissure. Small amount of subarachnoid hemorrhage layers within the suprasellar cistern, basal cisterns and posterior fossa. Overall volume of hemorrhage appears decreased relative to prior MRI from May 04, 2020. There are small acute infarcts  in the left basal ganglia, left periatrial temporal lobe and high bilateral frontoparietal regions. No substantial associated edema or mass effect. The ventricular system is slightly increased in size compared to the prior MRI with mild convexity of the third ventricle and rounding of the temporal horns. Vascular: Major arterial flow voids are maintained. Skull and upper cervical spine: Normal marrow signal. Sinuses/Orbits: Mild paranasal sinus mucosal thickening without air-fluid levels. Unremarkable orbits. Other: Motion limited study. IMPRESSION: 1. Small acute infarcts in the left basal ganglia, left periatrial temporal lobe and high bilateral frontoparietal region. No substantial associated edema or mass effect. 2. The ventricular system is slightly increased in size compared to the prior MRI with mild convexity of the third ventricle and rounding of the temporal horns, concerning for developing hydrocephalus 3. When compared to prior MRI, decreased small volume acute/subacute parafalcine subdural hemorrhage and scattered subarachnoid hemorrhage, as detailed above. Similar bilateral subdural hygromas. Electronically Signed: By: Margaretha Sheffield MD On: 05/11/2020 18:02   MR BRAIN WO CONTRAST  Result Date: 05/05/2020 CLINICAL DATA:  Initial evaluation for headache, subarachnoid hemorrhage. EXAM: MRI HEAD WITHOUT CONTRAST TECHNIQUE: Multiplanar, multiecho pulse sequences of the brain and surrounding structures were obtained without intravenous contrast. COMPARISON:  Prior CTs from 05/02/2020 as well as earlier studies. FINDINGS: Brain: Advanced cerebral atrophy for age. Mild scattered T2/FLAIR hyperintensities noted within the periventricular and deep white matter both cerebral hemispheres, nonspecific, but most like related chronic microvascular ischemic disease, minimal for age. No abnormal foci of restricted diffusion to suggest acute or subacute ischemia. Gray-white matter differentiation maintained. No  encephalomalacia to suggest chronic cortical infarction. Pituitary gland within normal limits. Midline structures intact. Scattered subarachnoid hemorrhage again seen throughout the brain. Preponderance of the blood products is situated within the basilar cisterns, with extension into the left greater than right sylvian fissures. Hemorrhage extends into the left perimesencephalic cistern. Overall, this is likely slightly decreased as compared to prior head CT from 05/02/2020. Trace intraventricular hemorrhage consistent with redistribution. Stable ventricular size without worsened hydrocephalus or ventriculomegaly. Scattered small volume extra-axial hemorrhage overlies the left greater than right cerebral hemispheres, with extension along the falx. Superimposed chronic bilateral subdural hygromas measuring up to approximately 5 mm bilaterally noted. No midline shift. Basilar cisterns remain patent. Vascular: Major intracranial vascular flow voids are maintained. Skull and upper cervical spine: Craniocervical junction within normal limits. Bone marrow signal intensity normal. No visible scalp soft tissue abnormality. Sinuses/Orbits: Globes and orbital soft tissues within normal limits. Paranasal sinuses are largely clear. Trace bilateral mastoid effusions, of doubtful significance. Other: None. IMPRESSION: 1. Scattered subarachnoid hemorrhage throughout the brain, overall slightly decreased as compared to prior head CT from 05/02/2020. 2. Trace intraventricular hemorrhage consistent with redistribution. Stable ventricular size without worsened hydrocephalus or ventriculomegaly. 3. Scattered small volume extra-axial hemorrhage overlying the left greater than right cerebral hemispheres with extension along the falx. Superimposed chronic bilateral subdural hygromas measuring up to approximately 5 mm without midline shift or significant mass effect. 4. Underlying advanced cerebral atrophy for age. Electronically Signed    By: Jeannine Boga M.D.   On: 05/05/2020 00:08   DG Hand 2  View Right  Result Date: 05/11/2020 CLINICAL DATA:  70 year old male status post fall with pain. EXAM: RIGHT HAND - 2 VIEW COMPARISON:  None. FINDINGS: PA and oblique lateral views of the right hand. Distal radius and ulna appear intact. Dystrophic or vascular calcifications along the radial aspect of the wrist. On the PA view the scaphoid bone appears irregular, but there is no definite scaphoid fracture on the 2nd view. Other carpal bones appear intact and normally aligned. No metacarpal or phalanx fracture identified. No discrete soft tissue injury. IMPRESSION: 1. Difficult to exclude scaphoid fracture on these two views (and lateral view is oblique). Recommend dedicated Four View Right Wrist Series if pain in this region persists. 2. Otherwise no acute fracture or dislocation identified about the right hand. Electronically Signed   By: Genevie Ann M.D.   On: 05/11/2020 05:32   DG Abd Portable 1V  Result Date: 05/11/2020 CLINICAL DATA:  Nasogastric tube placement. EXAM: PORTABLE ABDOMEN - 1 VIEW COMPARISON:  None. FINDINGS: The bowel gas pattern is normal. Nasogastric tube is seen looped within the stomach with the distal tip in the proximal stomach. No radio-opaque calculi or other significant radiographic abnormality are seen. IMPRESSION: Nasogastric tube tip seen in proximal stomach. No evidence of bowel obstruction or ileus. Electronically Signed   By: Marijo Conception M.D.   On: 05/11/2020 20:26   ECHOCARDIOGRAM COMPLETE  Result Date: 05/11/2020    ECHOCARDIOGRAM REPORT   Patient Name:   MOSE MABE Date of Exam: 05/11/2020 Medical Rec #:  [id number]      Height:       73.0 in Accession #:    [id number]     Weight:       183.0 lb Date of Birth:  03-06-1951      BSA:          2.072 m Patient Age:    24 years       BP:           98/59 mmHg Patient Gender: M              HR:           68 bpm. Exam Location:  Inpatient Procedure: 2D Echo,  Cardiac Doppler and Color Doppler                       STAT ECHO Reported to: Dr. Jenkins Rouge on 05/11/2020 8:51:00 AM. Indications:    I42.9 Cardiomyopathy (unspecified)  History:        Patient has prior history of Echocardiogram examinations, most                 recent 04/19/2019. Risk Factors:Dyslipidemia. Hypothyroidism.                 GERD. Alcoholism.  Sonographer:    Jonelle Sidle Dance Referring Phys: [id number] [place]  1. Left ventricular ejection fraction, by estimation, is 55 to 60%. The left ventricle has normal function. The left ventricle has no regional wall motion abnormalities. There is moderate left ventricular hypertrophy. Left ventricular diastolic parameters are consistent with Grade I diastolic dysfunction (impaired relaxation).  2. Right ventricular systolic function is mildly reduced. The right ventricular size is mildly enlarged.  3. Left atrial size was mildly dilated.  4. The mitral valve is normal in structure. Trivial mitral valve regurgitation. No evidence of mitral stenosis.  5. The aortic valve is tricuspid. Aortic valve regurgitation is mild. Mild aortic  valve stenosis.  6. Aortic dilatation noted. There is mild dilatation of the aortic root, measuring 41 mm.  7. The inferior vena cava is normal in size with greater than 50% respiratory variability, suggesting right atrial pressure of 3 mmHg. FINDINGS  Left Ventricle: Left ventricular ejection fraction, by estimation, is 55 to 60%. The left ventricle has normal function. The left ventricle has no regional wall motion abnormalities. The left ventricular internal cavity size was normal in size. There is  moderate left ventricular hypertrophy. Left ventricular diastolic parameters are consistent with Grade I diastolic dysfunction (impaired relaxation). Right Ventricle: The right ventricular size is mildly enlarged. Right vetricular wall thickness was not assessed. Right ventricular systolic function is mildly reduced.  Left Atrium: Left atrial size was mildly dilated. Right Atrium: Right atrial size was normal in size. Pericardium: There is no evidence of pericardial effusion. Mitral Valve: The mitral valve is normal in structure. There is mild thickening of the mitral valve leaflet(s). There is mild calcification of the mitral valve leaflet(s). Mild mitral annular calcification. Trivial mitral valve regurgitation. No evidence  of mitral valve stenosis. Tricuspid Valve: The tricuspid valve is normal in structure. Tricuspid valve regurgitation is not demonstrated. No evidence of tricuspid stenosis. Aortic Valve: The aortic valve is tricuspid. Aortic valve regurgitation is mild. Aortic regurgitation PHT measures 907 msec. Mild aortic stenosis is present. Pulmonic Valve: The pulmonic valve was normal in structure. Pulmonic valve regurgitation is not visualized. No evidence of pulmonic stenosis. Aorta: The aortic root is normal in size and structure and aortic dilatation noted. There is mild dilatation of the aortic root, measuring 41 mm. Venous: The inferior vena cava is normal in size with greater than 50% respiratory variability, suggesting right atrial pressure of 3 mmHg. IAS/Shunts: The interatrial septum was not well visualized.  LEFT VENTRICLE PLAX 2D LVIDd:         3.30 cm  Diastology LVIDs:         2.40 cm  LV e' lateral:   4.90 cm/s LV PW:         1.30 cm  LV E/e' lateral: 8.5 LV IVS:        1.40 cm LVOT diam:     1.90 cm LV SV:         58 LV SV Index:   28 LVOT Area:     2.84 cm  RIGHT VENTRICLE          IVC RV Basal diam:  2.20 cm  IVC diam: 1.90 cm TAPSE (M-mode): 1.2 cm LEFT ATRIUM             Index       RIGHT ATRIUM          Index LA diam:        4.10 cm 1.98 cm/m  RA Area:     9.87 cm LA Vol (A2C):   41.0 ml 19.79 ml/m RA Volume:   20.20 ml 9.75 ml/m LA Vol (A4C):   38.8 ml 18.73 ml/m LA Biplane Vol: 40.0 ml 19.31 ml/m  AORTIC VALVE LVOT Vmax:   120.00 cm/s LVOT Vmean:  77.200 cm/s LVOT VTI:    0.203 m AI PHT:       907 msec  AORTA Ao Root diam: 4.10 cm Ao Asc diam:  3.90 cm MITRAL VALVE               TRICUSPID VALVE MV Area (PHT): 2.62 cm    TR Peak grad:   16.0 mmHg  MV Decel Time: 289 msec    TR Vmax:        200.00 cm/s MV E velocity: 41.60 cm/s MV A velocity: 66.40 cm/s  SHUNTS MV E/A ratio:  0.63        Systemic VTI:  0.20 m                            Systemic Diam: 1.90 cm Jenkins Rouge MD Electronically signed by Jenkins Rouge MD Signature Date/Time: 05/11/2020/9:27:02 AM    Final    VAS [place] TRANSCRANIAL DOPPLER  Result Date: 05/12/2020  Transcranial Doppler Indications: Stroke. Comparison Study: no prior Performing Technologist: Abram Sander RVS  Examination Guidelines: A complete evaluation includes B-mode imaging, spectral Doppler, color Doppler, and power Doppler as needed of all accessible portions of each vessel. Bilateral testing is considered an integral part of a complete examination. Limited examinations for reoccurring indications may be performed as noted.  +----------+-------------+----------+-----------+-------+ RIGHT TCD Right VM (cm)Depth (cm)PulsatilityComment +----------+-------------+----------+-----------+-------+ MCA           60.00                 1.34            +----------+-------------+----------+-----------+-------+ ACA          -32.00                 1.71            +----------+-------------+----------+-----------+-------+ Term ICA      23.00                 1.19            +----------+-------------+----------+-----------+-------+ PCA           31.00                 1.11            +----------+-------------+----------+-----------+-------+ Opthalmic     20.00                 1.63            +----------+-------------+----------+-----------+-------+ ICA siphon    29.00                 1.25            +----------+-------------+----------+-----------+-------+ Vertebral    -26.00                 1.16             +----------+-------------+----------+-----------+-------+  +----------+------------+----------+-----------+-------+ LEFT TCD  Left VM (cm)Depth (cm)PulsatilityComment +----------+------------+----------+-----------+-------+ MCA          38.00                 1.23            +----------+------------+----------+-----------+-------+ ACA          -27.00                1.31            +----------+------------+----------+-----------+-------+ Term ICA     24.00                 1.18            +----------+------------+----------+-----------+-------+ PCA          30.00                 0.86            +----------+------------+----------+-----------+-------+  Opthalmic    32.00                 1.63            +----------+------------+----------+-----------+-------+ ICA siphon   37.00                 1.01            +----------+------------+----------+-----------+-------+ Vertebral    -41.00                1.00            +----------+------------+----------+-----------+-------+  +------------+-------+-------+             VM cm/sComment +------------+-------+-------+ Prox Basilar-25.00         +------------+-------+-------+ Summary:  Slightly low basilar and left middle cerebral artery mean flow velocities suggest distal stenosis otherwise normal mean flow velocitie sin remaining identified vessels of anterior and postererior cerebral circulation.No evidence of vasospasm noted. *See table(s) above for TCD measurements and observations.  Diagnosing physician: Antony Contras MD Electronically signed by Antony Contras MD on 05/12/2020 at 12:40:28 PM.    Final    IR ANGIO INTRA EXTRACRAN SEL INTERNAL CAROTID BILAT MOD SED  Result Date: 05/14/2020 PROCEDURE: DIAGNOSTIC CEREBRAL ANGIOGRAM HISTORY: The patient is a 70 year old man in mid to the hospital after being found down at home. Workup included CT scan demonstrating subarachnoid hemorrhage while initial CT angiogram was  negative for intracranial aneurysm. Patient has been monitored in the hospital and noted to have worsening confusion and aphasia which has improved over the last few days. He presents today for follow-up diagnostic cerebral angiogram. ACCESS: The technical aspects of the procedure as well as its potential risks and benefits were reviewed with the patient. These risks included but were not limited bleeding, infection, allergic reaction, damage to organs or vital structures, stroke, non-diagnostic procedure, and the catastrophic outcomes of heart attack, coma, and death. With an understanding of these risks, informed consent was obtained and witnessed. The patient was placed in the supine position on the angiography table and the skin of right groin prepped in the usual sterile fashion. The procedure was performed under local anesthesia (1%-solution of bicarbonate-buffered Lidocaine) and conscious sedation with 1mg  versed and 8micrograms fentanyl monitored by myself and the in-suite nurse using continuous pulse-oximetry, heart rate, and non-invasive blood-pressure. A 5- French sheath was introduced in the right common femoral artery using Seldinger technique. A fluoro-phase sequence was used to document the sheath position. MEDICATIONS: HEPARIN: 3000 Units total. CONTRAST:  cc, Omnipaque 300 FLUOROSCOPY TIME:  FLUOROSCOPY TIME: See IR records TECHNIQUE: CATHETERS AND WIRES 5-French JB-1 catheter 0.035" glidewire VESSELS CATHETERIZED Right internal carotid Left internal carotid Left vertebral Right vertebral Right common femoral VESSELS STUDIED Right internal carotid, head Left internal carotid, head Left vertebral Right vertebral Right femoral PROCEDURAL NARRATIVE A 5-Fr JB-1 catheter was advanced over a 0.035 glidewire into the aortic arch. The above vessels were then sequentially catheterized and cervical / cerebral angiograms taken. After review of images, the catheter was removed without incident. FINDINGS: Right  internal carotid, head: Injection reveals the presence of a widely patent ICA, M1, and A1 segments and their branches. No aneurysms, AVMs, or high-flow fistulas are seen. Incidental note is made of triplicate A2 segments. There is moderate to severe vasospasm involving the right A2 and more distal segments of the anterior cerebral artery. There is mild vasospasm involving the M2 and more distal segments of the right middle cerebral artery. The parenchymal and  venous phases are normal. The venous sinuses are widely patent. Left internal carotid, head: Injection reveals the presence of a widely patent ICA, A1, and M1 segments and their branches. No aneurysms, AVMs, or high-flow fistulas are seen. There is severe vasospasm involving the right A1 segment. There is also moderate to severe vasospasm involving the M2 and more distal segments of the left middle cerebral artery. The parenchymal and venous phases are normal. The venous sinuses are widely patent. Right vertebral: Injection reveals the presence of a widely patent vertebral artery. This leads to a widely patent basilar artery that terminates in bilateral P1. The basilar apex is normal. No aneurysms, AVMs, or high-flow fistulas are seen. There is mild vaso spasm involving primarily the left posterior cerebral artery territory. The parenchymal and venous phases are normal. The venous sinuses are widely patent. Right vertebral: The vertebral artery is widely patent. No PICA aneurysm is seen. See basilar description above. Right femoral: Normal vessel. No significant atherosclerotic disease. Arterial sheath in adequate position. DISPOSITION: Upon completion of the study, the femoral sheath was removed and hemostasis obtained using a 5-Fr ExoSeal closure device. Good proximal and distal lower extremity pulses were documented upon achievement of hemostasis. The procedure was well tolerated and no early complications were observed. The patient was transferred to the  holding area to lay flat for 2 hours. IMPRESSION: 1. No intracranial aneurysms, arteriovenous malformations, or fistulas are identified as an etiology for subarachnoid hemorrhage. 2. Moderate to severe vasospasm involving bilateral anterior circulation and mild vasospasm involving the left posterior cerebral artery territory, as described above. The preliminary results of this procedure were shared with the patient and the patient's family. Electronically Signed   By: Consuella Lose   On: 05/14/2020 14:35    Microbiology: No results found for this or any previous visit (from the past 240 hour(s)).   Labs: Basic Metabolic Panel: Recent Labs  Lab 05/10/20 0411 05/11/20 0440 05/11/20 1005 05/12/20 0056 05/13/20 0100 05/14/20 0340 05/15/20 0422  NA 126* 131* 128* 134* 136 135 133*  K 3.7 4.3 3.6 3.9 3.1* 2.8* 4.0  CL 93* 95*  --  105 105 105 103  CO2 21* 15*  --  14* 16* 18* 15*  GLUCOSE 86 146*  --  105* 128* 93 104*  BUN 16 24*  --  27* 21 11 16   CREATININE 0.97 2.16*  --  2.03* 1.56* 1.08 1.09  CALCIUM 9.1 9.8  --  8.5* 8.8* 8.7* 9.2  MG 2.1 2.7*  --   --   --  1.5* 2.1  PHOS  --   --   --   --   --  2.6 3.4   Liver Function Tests: Recent Labs  Lab 05/11/20 0440  AST 35  ALT 25  ALKPHOS 67  BILITOT 2.0*  PROT 6.4*  ALBUMIN 3.9   No results for input(s): LIPASE, AMYLASE in the last 168 hours. No results for input(s): AMMONIA in the last 168 hours. CBC: Recent Labs  Lab 05/10/20 0411 05/11/20 0440 05/11/20 1005 05/12/20 0056 05/14/20 0340  WBC 8.5 12.7*  --  17.5* 11.4*  NEUTROABS  --  10.8*  --   --   --   HGB 14.6 14.7 11.2* 12.3* 13.0  HCT 39.5 39.8 33.0* 33.3* 35.2*  MCV 95.2 95.4  --  95.4 95.7  PLT 225 307  --  273 292   Cardiac Enzymes: Recent Labs  Lab 05/11/20 0556  CKTOTAL 111   BNP: BNP (  last 3 results) No results for input(s): BNP in the last 8760 hours.  ProBNP (last 3 results) No results for input(s): PROBNP in the last 8760  hours.  CBG: Recent Labs  Lab 05/11/20 0435  GLUCAP 143*       Signed:  Florencia Reasons MD, PhD, FACP  [facility] 05/16/2020, 9:11 AM

## 2020-05-16 NOTE — Progress Notes (Signed)
Pt has been discharged from the unit via wheelchair. Equipment has been sent to the patient and all belongings are sent with the patient. AVS has been given and reviewed pt is sent in good spirits.

## 2020-05-17 ENCOUNTER — Other Ambulatory Visit: Payer: Self-pay

## 2020-05-17 ENCOUNTER — Encounter (HOSPITAL_COMMUNITY): Payer: Self-pay

## 2020-05-17 ENCOUNTER — Other Ambulatory Visit (HOSPITAL_COMMUNITY): Payer: Self-pay | Admitting: Neurosurgery

## 2020-05-17 ENCOUNTER — Encounter: Payer: Self-pay | Admitting: Radiology

## 2020-05-17 ENCOUNTER — Ambulatory Visit (INDEPENDENT_AMBULATORY_CARE_PROVIDER_SITE_OTHER): Payer: Medicare Other

## 2020-05-17 DIAGNOSIS — I609 Nontraumatic subarachnoid hemorrhage, unspecified: Secondary | ICD-10-CM

## 2020-05-17 DIAGNOSIS — R55 Syncope and collapse: Secondary | ICD-10-CM

## 2020-05-17 NOTE — Progress Notes (Signed)
Enrolled patient for a 14 day Zio XT Monitor to be mailed to patients home  

## 2020-05-20 ENCOUNTER — Other Ambulatory Visit: Payer: Self-pay

## 2020-05-20 ENCOUNTER — Ambulatory Visit: Payer: Medicare Other | Attending: Internal Medicine | Admitting: Occupational Therapy

## 2020-05-20 ENCOUNTER — Telehealth: Payer: Self-pay

## 2020-05-20 ENCOUNTER — Encounter: Payer: Self-pay | Admitting: Physical Therapy

## 2020-05-20 ENCOUNTER — Ambulatory Visit: Payer: Medicare Other

## 2020-05-20 ENCOUNTER — Ambulatory Visit: Payer: Medicare Other | Admitting: Physical Therapy

## 2020-05-20 ENCOUNTER — Encounter: Payer: Self-pay | Admitting: Occupational Therapy

## 2020-05-20 DIAGNOSIS — R41841 Cognitive communication deficit: Secondary | ICD-10-CM | POA: Insufficient documentation

## 2020-05-20 DIAGNOSIS — R2689 Other abnormalities of gait and mobility: Secondary | ICD-10-CM | POA: Diagnosis not present

## 2020-05-20 DIAGNOSIS — R4701 Aphasia: Secondary | ICD-10-CM | POA: Insufficient documentation

## 2020-05-20 DIAGNOSIS — I69018 Other symptoms and signs involving cognitive functions following nontraumatic subarachnoid hemorrhage: Secondary | ICD-10-CM | POA: Insufficient documentation

## 2020-05-20 DIAGNOSIS — R2681 Unsteadiness on feet: Secondary | ICD-10-CM

## 2020-05-20 DIAGNOSIS — M6281 Muscle weakness (generalized): Secondary | ICD-10-CM | POA: Diagnosis not present

## 2020-05-20 NOTE — Therapy (Addendum)
[facility] [address] [place], [name], [address] Phone: [phone]   Fax:  [phone]  Speech Language Pathology Evaluation  Patient Details  Name: Jeffrey Acosta MRN: [id number] Date of Birth: 17-Jun-1950 Referring Provider (SLP): Florencia Reasons (referring); Deetta Perla, MD (doc)   Encounter Date: 05/20/2020   End of Session - 05/20/20 1251    Visit Number 1    Number of Visits 17    Date for SLP Re-Evaluation 08/16/20    SLP Start Time 0935    SLP Stop Time  1017    SLP Time Calculation (min) 42 min    Activity Tolerance Patient tolerated treatment well           Past Medical History:  Diagnosis Date  . Alcoholism in remission ([place])    in [place]  . Arrhythmia    atrial tachycardia  . Bipolar affective disorder ([place])   . Diverticulosis of colon (without mention of hemorrhage)   . GERD (gastroesophageal reflux disease)   . Gout of multiple sites   . Hiatal hernia   . Hx of migraines   . Hydrocele   . HYPERLIPIDEMIA 01/10/2009   Qualifier: Diagnosis of  By: Ronnald Ramp CMA, [place]    NMR Lipoprofile 2011 LDL could not be calculated  Due to TG of 889  (811  / 750 ),  HDL 50 . Father MI @ 72 PGM CVA early 10s; PGF CVA @72    . Hypothyroidism   . Melanoma of skin, site unspecified 11/25/2012   07/2011 abdominal  Stage 1 Dr Amy [place] Dr Sarajane Jews   . Osteopenia   . Polyarthralgia     Past Surgical History:  Procedure Laterality Date  . BILIARY BRUSHING  12/07/2018   Procedure: BILIARY BRUSHING;  Surgeon: Rush Landmark Telford Nab., MD;  Location: [facility];  Service: Gastroenterology;;  . BILIARY DILATION  12/07/2018   Procedure: BILIARY DILATION;  Surgeon: Irving Copas., MD;  Location: [facility];  Service: Gastroenterology;;  . BIOPSY  12/07/2018   Procedure: BIOPSY;  Surgeon: Irving Copas., MD;  Location: [facility];  Service: Gastroenterology;;  . COLONOSCOPY  2003   negative ; Dr Sharlett Iles   . ERCP N/A 12/07/2018   Procedure: ENDOSCOPIC RETROGRADE CHOLANGIOPANCREATOGRAPHY (ERCP);  Surgeon: Irving Copas., MD;  Location: [facility];  Service: Gastroenterology;  Laterality: N/A;  . ESOPHAGOGASTRODUODENOSCOPY (EGD) WITH PROPOFOL N/A 12/07/2018   Procedure: ESOPHAGOGASTRODUODENOSCOPY (EGD) WITH PROPOFOL;  Surgeon: Rush Landmark Telford Nab., MD;  Location: [facility];  Service: Gastroenterology;  Laterality: N/A;  . EUS N/A 12/07/2018   Procedure: UPPER ENDOSCOPIC ULTRASOUND (EUS) RADIAL;  Surgeon: Rush Landmark Telford Nab., MD;  Location: [facility];  Service: Gastroenterology;  Laterality: N/A;  . INGUINAL HERNIA REPAIR  08/07/2011   Procedure: HERNIA REPAIR INGUINAL ADULT;  Surgeon: Rolm Bookbinder, MD;  Location: [facility];  Service: General;  Laterality: Right;  . IR ANGIO INTRA EXTRACRAN SEL INTERNAL CAROTID BILAT MOD SED  05/14/2020  . IR ANGIO VERTEBRAL SEL VERTEBRAL BILAT MOD SED  05/14/2020  . MELANOMA EXCISION  07/28/11   Dr Sarajane Jews; stomach and chest  . REMOVAL OF STONES  12/07/2018   Procedure: REMOVAL OF STONES;  Surgeon: Rush Landmark Telford Nab., MD;  Location: [facility];  Service: Gastroenterology;;  . [name]  1951  . 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 UPPER GASTROINTESTINAL ENDOSCOPY  1983   hiatal hernia  . VASECTOMY  1979    There were no vitals filed  for this visit.   Subjective Assessment - 05/20/20 0944    Subjective "I think I'm a little slower thinking of a particular word." "We've always called him the word master." (wife, [name])    Patient is accompained by: Family member   [name] - wife   Currently in Pain? No/denies              SLP Evaluation [facility] - 05/20/20 0945      SLP Visit Information   SLP Received On 05/20/20    Referring Provider (SLP) Florencia Reasons (referring); Deetta Perla, MD (doc)    Onset Date 04-30-20    Medical Diagnosis HPI SAH  70 yo male presenting from home after a fall at home. 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 PMH includes: alcohol use, bipolar affective disorder,  hypothyroidism, atrial tachycardia. ST in hospital 05-14-20 ID'd pt's aphasia, however OT also notes cognitive deficits.     Subjective   Subjective "I think iwth processing - I have to stop and think and focus more (than prior to hospitalization)."    Patient/Family Stated Goal Pt would like to drive again and also be more adept at word finding.      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    Memory Comments Hopkins Verbal Learning Test - RECALL: 3/12 (below WNL), 4/12 (below WNL), 5/12 (below WNL), and RECOGNITION: 11/12 (below WNL). Pt's wife is administering pt's meds and pt is noting his appointments in his Day-Timer.    Awareness Comments Pt did not note cognitive deficits until SLP asked pt about processing.    Problem Solving Slow processing    Problem Solving Comments Pt endorses slower than baseline processing. Pt's wife is administering pt's meds.    Attention Selective    Awareness Impaired    Awareness Impairment Intellectual impairment      Verbal Expression   Overall Verbal Expression Impaired    Level of Generative/Spontaneous Verbalization Conversation    Other Verbal Expression Comments Pt told SLP he experiences anomia 3-4 times a day but only rarely needs to use compensations. Wife tells SLP pt has less-frequent dysnomia.      Motor Speech   Overall Motor Speech Appears within functional limits for tasks  assessed                           SLP Education - 05/20/20 1246    Education Details eval results/deficit areas, possible goals,    Person(s) Educated Patient;Spouse    Methods Explanation    Comprehension Verbalized understanding;Need further instruction            SLP Short Term Goals - 05/20/20 1402      SLP SHORT TERM GOAL #1   Title Pt will demo selective attention in a mod noisy environment for 10 minutes with rare min A in 3 sessions    Time 4    Period Weeks    Status New      SLP SHORT  TERM GOAL #2   Title pt will demonstrate intellectual awareness for 3 cognitive deficits by giving examples in 2 sessions    Time 4      SLP SHORT TERM GOAL #3   Title pt will use memory compensations for more independent medication adminstration    Time 4    Period Weeks    Status New      SLP SHORT TERM GOAL #4   Title pt will complete cognitive lingiustic and aphasia evaluations PRN    Time 2    Period Weeks    Status New      SLP SHORT TERM GOAL #5   Title pt will engage in mod complex conversation for 15 minutes with compensations in 3 sessions    Time 4    Period Weeks    Status New            SLP Long Term Goals - 05/20/20 1648      SLP LONG TERM GOAL #1   Title pt will demonstrate divided attention between two simple cognitive linguistic tasks with 100% success each - with compensations (double checking, etc) in 3 sessions    Time 8    Period Weeks   or 17 sessions, for all LTGs   Status New      SLP LONG TERM GOAL #2   Title pt will demo anticipatory awareness by double checking cognitive-linguistic thearpy work 100% of the time    Time 8    Period Weeks    Status New      SLP LONG TERM GOAL #3   Title pt will use memory compensations succesfully for errands, med administration/refills, to-do lists, etc between 3 sessions    Time 8    Period Weeks    Status New      SLP LONG TERM GOAL #4   Title pt will engage in 15 minutes mod  complex/complex conversation successfully using compensation strategies in 3 sessions    Time [place] - 05/20/20 [place] presents today with cognitive communication deficits in attention, memory, awareness and possibly processing. Pt and wife report anomia x3-4/day and rare dysnomia however none was observed today by SLP in mod complex conversation throughout the eval. Pt may need aphasia assessment in first 1-2 sessions. Pt will benefit from skilled ST addressing cognitive linguistics and aphasia in conversation.    Speech Therapy Frequency 2x / week    Duration 8 weeks   or 17 sessions   Treatment/Interventions Environmental controls;Language facilitation;Cueing hierarchy;Cognitive reorganization;Internal/external aids;Patient/family education;SLP instruction and feedback;Functional tasks    Potential to Achieve Goals Good    Consulted and Agree with Plan of Care Patient           Patient will benefit from skilled therapeutic intervention in order to improve the following deficits and impairments:   Aphasia  Cognitive communication deficit    Problem List Patient Active Problem List   Diagnosis Date Noted  . Benign essential HTN 05/06/2020  . Subarachnoid hemorrhage ([place]) 04/30/2020  . Leukopenia 07/15/2018  . Thrombocytopenia ([place]) 07/15/2018  . PCP NOTES >>>>>>> 03/23/2015  . Annual physical exam 03/20/2015  . Bronchospasm 09/06/2014  . DJD (degenerative joint disease) 09/06/2014  . Hypothyroidism 08/28/2014  . Tremor 07/29/2014  . Gait disorder 07/29/2014  . Gout 01/08/2014  . Vitamin D deficiency 11/25/2012  .  Melanoma of skin, site unspecified 11/25/2012  . 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 Alcoholism in remission ([place]) 06/19/2011  . Diverticulitis of colon (without mention of hemorrhage)(562.11) 06/19/2011  . Bipolar I disorder ([place]) 06/19/2011  . Osteopenia 05/17/2010  .  Alcohol abuse 05/16/2010  . HYPERTRIGLYCERIDEMIA 08/02/2009  . Macrocytic anemia 01/10/2009  . ABNORMAL ELECTROCARDIOGRAM 11/26/2008  . REFLUX, ESOPHAGEAL 11/17/2006  . COMMON MIGRAINE 09/22/2006    [facility] ,MS, CCC-SLP  05/20/2020, 4:54 PM  [facility] [address] [place], [name], [address] Phone: [phone]   Fax:  [phone]  Name: Jeffrey Acosta MRN: [id number] Date of Birth: Dec 28, 1950

## 2020-05-20 NOTE — Therapy (Signed)
[facility] [address] [place], [name], [address] Phone: [phone]   Fax:  [phone]  Occupational Therapy Evaluation  Patient Details  Name: Jeffrey Acosta MRN: [id number] Date of Birth: June 22, 1950 Referring Provider (OT): Dr. Florencia Reasons   Encounter Date: 05/20/2020   OT End of Session - 05/20/20 0901    Visit Number 1    Number of Visits 17    Date for OT Re-Evaluation 07/19/20    Authorization Type BCBS Medicare, copay per disc, no auth/VL (cognitive retraining not covered)    Authorization Time Period cert. date 05/21/19-08/18/20    Authorization - Visit Number 1    Authorization - Number of Visits 10    Progress Note Due on Visit 10   PN needed   OT Start Time 0803    OT Stop Time 0845    OT Time Calculation (min) 42 min    Activity Tolerance Patient tolerated treatment well    Behavior During Therapy Impulsive           Past Medical History:  Diagnosis Date  . Alcoholism in remission ([place])    in [place]  . Arrhythmia    atrial tachycardia  . Bipolar affective disorder ([place])   . Diverticulosis of colon (without mention of hemorrhage)   . GERD (gastroesophageal reflux disease)   . Gout of multiple sites   . Hiatal hernia   . Hx of migraines   . Hydrocele   . HYPERLIPIDEMIA 01/10/2009   Qualifier: Diagnosis of  By: Ronnald Ramp CMA, [place]    NMR Lipoprofile 2011 LDL could not be calculated  Due to TG of 889  (811  / 750 ),  HDL 50 . Father MI @ 61 PGM CVA early 51s; PGF CVA @72    . Hypothyroidism   . Melanoma of skin, site unspecified 11/25/2012   07/2011 abdominal  Stage 1 Dr Amy Martinique Dr Sarajane Jews   . Osteopenia   . Polyarthralgia     Past Surgical History:  Procedure Laterality Date  . BILIARY BRUSHING  12/07/2018   Procedure: BILIARY BRUSHING;  Surgeon: Rush Landmark Telford Nab., MD;  Location: [facility];  Service: Gastroenterology;;  . BILIARY DILATION  12/07/2018   Procedure: BILIARY DILATION;   Surgeon: Irving Copas., MD;  Location: [facility];  Service: Gastroenterology;;  . BIOPSY  12/07/2018   Procedure: BIOPSY;  Surgeon: Irving Copas., MD;  Location: [facility];  Service: Gastroenterology;;  . COLONOSCOPY  2003   negative ; Dr Sharlett Iles  . ERCP N/A 12/07/2018   Procedure: ENDOSCOPIC RETROGRADE CHOLANGIOPANCREATOGRAPHY (ERCP);  Surgeon: Irving Copas., MD;  Location: [facility];  Service: Gastroenterology;  Laterality: N/A;  . ESOPHAGOGASTRODUODENOSCOPY (EGD) WITH PROPOFOL N/A 12/07/2018   Procedure: ESOPHAGOGASTRODUODENOSCOPY (EGD) WITH PROPOFOL;  Surgeon: Rush Landmark Telford Nab., MD;  Location: [facility];  Service: Gastroenterology;  Laterality: N/A;  . EUS N/A 12/07/2018   Procedure: UPPER ENDOSCOPIC ULTRASOUND (EUS) RADIAL;  Surgeon: Rush Landmark Telford Nab., MD;  Location: [facility];  Service: Gastroenterology;  Laterality: N/A;  . INGUINAL HERNIA REPAIR  08/07/2011   Procedure: HERNIA REPAIR INGUINAL ADULT;  Surgeon: Rolm Bookbinder, MD;  Location: [facility];  Service: General;  Laterality: Right;  . IR ANGIO INTRA EXTRACRAN SEL INTERNAL CAROTID BILAT MOD SED  05/14/2020  . IR ANGIO VERTEBRAL SEL VERTEBRAL BILAT MOD SED  05/14/2020  . MELANOMA EXCISION  07/28/11   Dr Sarajane Jews; stomach and chest  . REMOVAL OF STONES  12/07/2018   Procedure:  REMOVAL OF STONES;  Surgeon: Mansouraty, Netty StarringGabriel Jr., MD;  [facility];  Service: Gastroenterology;;  . Sigurd SosNSILLECTOMY  1951  . 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 UPPER GASTROINTESTINAL ENDOSCOPY  1983   hiatal hernia  . VASECTOMY  1979    There were no vitals filed for this visit.   Subjective Assessment - 05/20/20 0805    Patient is accompanied by: Family member   wife   Pertinent History subarachnoid hemorrhage (CT 04/30/20 showed moderate to large acute SAH; 05/11/20 MRI brain showed small acute infarcts in L basal ganglia, L periatrial temporal lobe and high  bilateral frontoparietal regions).  PMH:  hx of alcohol use with intoxication on hospital presentation, hypothyroidism, atrial tachycardia, Barrett's esophagus, hx of atrial tachycardia, osteopenia, thrombocytopenia, DJD, gout, melanoma of skin, bipolar disorder    Patient Stated Goals return to previous activities    Currently in Pain? No/denies             [facility] - 05/20/20 0806      Assessment   Medical Diagnosis Subarachnoid hemorrhage    Referring Provider (OT) Dr. Albertine GratesFang Xu    Onset Date/Surgical Date 04/30/20    Hand Dominance Right    Prior Therapy hospitalized 04/30/20-05/16/20      Precautions   Precautions Fall    Precaution Comments No driving currently after hospitalization      Balance Screen   Has the patient fallen in the past 6 months Yes    How many times? 1   fell with bending over 2 days ago     Home  Environment   Family/patient expects to be discharged to: Private residence    Lives With Spouse      Prior Function   Level of Independence Independent    Vocation Retired    Leisure Nurse, adultan errands-going to [employer], bank, Catering manageretc; no formal exercise      ADL   Eating/Feeding Modified independent    Grooming Modified independent    Upper Body Bathing Modified independent    Lower Body Bathing Modified independent   sitting   Upper Body Dressing --   mod I   Lower Body Dressing Modified independent    Toilet Transfer Modified independent    Toileting - Clothing Manipulation Modified independent    Toileting -  Advice workerHygiene Modified Independent    Tub/Shower Transfer Minimal assistance    Tub/Shower Transfer Equipment --   tub/shower stall, 3-in-1     IADL   Prior Level of Function Shopping pt/wife performed together prior; has not attempted yet since hospitalization    Prior Level of Function Light Housekeeping wife performed most prior (pt made beds).  pt has not performed since hospitalization    Prior Level of Function Meal Prep wife did most  prior (occasionally cooked).  Pt able to get a drink and make sandwich, but not attempted other meal prep.    Prior Level of Function Community Mobility drove independently prior    Engineer, drillingCommunity Mobility Relies on family or friends for transportation    Prior Level of Function Medication Managment independent    Medication Management --   wife assisting due to new medicines   Prior Level of Function Financial Management performed with wife prior    Financial Management --   has not attempted     Mobility   Mobility Status History of falls    Mobility Status Comments Ambulating with RW mod I, 1 fall bending  over to retrieve item (able to get up without assistance)      Written Expression   Dominant Hand Right    Handwriting --   able to sign name without difficulty     Vision - History   Baseline Vision Wears glasses all the time    Additional Comments pt denies changes, WFL      Cognition   Overall Cognitive Status Impaired/Different from baseline   slow processing per wife, pt denies difficulty   Attention Selective    Selective Attention --   Number cancellation with 100% accuracy.  Decr alternating attention for Trail Making Part B (mod cues/significant difficulty and errors).   Memory Impaired    Memory Impairment --   mild per wife   Awareness Impaired    Awareness Impairment Intellectual impairment    Problem Solving Impaired    Executive Function Organizing;Sequencing    Sequencing Impaired    Organizing Impaired    Behaviors Impulsive    Cognition Comments Needed repetition/cueing for directions/impulsivity.  1.73M visual scanning/number cancellation sheet with 100 accuracy. Pt with mod cues/difficulty with clock drawing with omissions of numbers and hands omission and placement.      Observation/Other Assessments   Standing Functional Reach Test R-9", L-9"   with impulsivity and cueing to follow directions     Sensation   Light Touch Appears Intact   pt denies changes      Coordination   Fine Motor Movements are Fluid and Coordinated No   mild impairment   9 Hole Peg Test Right;Left    Right 9 Hole Peg Test 29.82    Left 9 Hole Peg Test 29.82      ROM / Strength   AROM / PROM / Strength AROM;Strength      AROM   Overall AROM  Within functional limits for tasks performed   BUEs     Strength   Overall Strength Deficits    Overall Strength Comments Bilateral shoulder flex and abduction 4+/5,  elbow flex/ext 5/5 bilaterally      Hand Function   Right Hand Grip (lbs) 64.1    Left Hand Grip (lbs) 64.5                           OT Education - 05/20/20 0949    Education Details OT eval results and POC    Person(s) Educated Patient;Spouse    Methods Explanation    Comprehension Verbalized understanding            OT Short Term Goals - 05/20/20 0918      OT SHORT TERM GOAL #1   Title Pt will be independent with HEP for bilateral shoulder strength.--check STGs 06/20/20    Time 4    Period Weeks    Status New      OT SHORT TERM GOAL #2   Title Pt will be able to perform shower transfer with supervision.    Time 4    Period Weeks    Status New      OT SHORT TERM GOAL #3   Title Pt will be able to perform simple alternating attention with at least 90% accuracy for incr safety.    Time 4    Period Weeks    Status New      OT SHORT TERM GOAL #4   Title Pt will perform mod complex functional problem solving with at least 90% accuracy.    Time  4    Period Weeks    Status New      OT SHORT TERM GOAL #5   Title Pt will verbalize understanding of cognitive compensation strategies for ADLs/IADLs.    Time 4    Period Weeks    Status New             OT Long Term Goals - 05/20/20 0925      OT LONG TERM GOAL #1   Title Pt will able to reach at least 10" bilaterally on standing functional reach test without cueing/impulsivity for improved safety for IADLs.    Time 8    Period Weeks    Status New      OT LONG TERM  GOAL #2   Title Pt will demo good safety awareness for retrieving items from low shelf/floor mod I/without cueing.    Time 8    Period Weeks    Status New      OT LONG TERM GOAL #3   Title Pt will perform simple prior cooking/home maintenance tasks mod I and demonstrating good safety awareness/problem solving.    Time 8    Period Weeks    Status New      OT LONG TERM GOAL #4   Title Pt will perform simple divided attention with at least 1 physical and 1 cognitive tasks for improved safety.    Time 8    Period Weeks    Status New      OT LONG TERM GOAL #5   Title Pt will perform shower transfer mod I.    Time 8    Period Weeks    Status New                 Plan - 05/20/20 0904    Clinical Impression Statement Pt is a 71 y.o. male referred to occupational therapy for subarachnoid (CT 04/30/20 showed moderate to large acute SAH; However, 05/11/20 MRI brain showed small acute infarcts in L basal ganglia, L periatrial temporal lobe and high bilateral frontoparietal regions).  Pt with PMH that includes:  hx of alcohol use with intoxication on hospital presentation, hypothyroidism, atrial tachycardia, Barrett's esophagus, hx of atrial tachycardia, osteopenia, thrombocytopenia, DJD, gout, melanoma of skin, bipolar disorder.  Pt was independent prior to hospitalization, but now needs assist for ADLs/IADLs.  Pt presents today with decr balance, cognitive deficits and impulsivity, decr stength.  Pt would benefit from occupational therapy to address these deficits for incr safety and independence to be able to return to prior ADLs/IADLs.    OT Occupational Profile and History Detailed Assessment- Review of Records and additional review of physical, cognitive, psychosocial history related to current functional performance    Occupational performance deficits (Please refer to evaluation for details): ADL's;IADL's    Body Structure / Function / Physical Skills  ADL;Strength;Balance;IADL;Endurance;Mobility;Coordination;Decreased knowledge of precautions    Cognitive Skills Problem Solve;Safety Awareness;Attention;Memory;Sequencing    Rehab Potential Good    Clinical Decision Making Several treatment options, min-mod task modification necessary    Comorbidities Affecting Occupational Performance: May have comorbidities impacting occupational performance    Modification or Assistance to Complete Evaluation  Min-Moderate modification of tasks or assist with assess necessary to complete eval    OT Frequency 2x / week    OT Duration 8 weeks   +eval or 17 total visits   OT Treatment/Interventions Self-care/ADL training;DME and/or AE instruction;Balance training;Therapeutic activities;Cognitive remediation/compensation;Therapeutic exercise;Neuromuscular education;Functional Mobility Training;Patient/family education;Manual Therapy;Passive range of motion;Energy conservation;Cryotherapy;Moist Heat  Plan initiate HEP for bilateral shoulder strength, address cognition in functional context    Consulted and Agree with Plan of Care Patient;Family member/caregiver    Family Member Consulted wife           Patient will benefit from skilled therapeutic intervention in order to improve the following deficits and impairments:   Body Structure / Function / Physical Skills: ADL,Strength,Balance,IADL,Endurance,Mobility,Coordination,Decreased knowledge of precautions Cognitive Skills: Problem Solve,Safety Awareness,Attention,Memory,Sequencing     Visit Diagnosis: Other symptoms and signs involving cognitive functions following nontraumatic subarachnoid hemorrhage  Muscle weakness (generalized)  Unsteadiness on feet    Problem List Patient Active Problem List   Diagnosis Date Noted  . Benign essential HTN 05/06/2020  . Subarachnoid hemorrhage ([place]) 04/30/2020  . Leukopenia 07/15/2018  . Thrombocytopenia ([place]) 07/15/2018  . PCP NOTES >>>>>>> 03/23/2015  .  Annual physical exam 03/20/2015  . Bronchospasm 09/06/2014  . DJD (degenerative joint disease) 09/06/2014  . Hypothyroidism 08/28/2014  . Tremor 07/29/2014  . Gait disorder 07/29/2014  . Gout 01/08/2014  . Vitamin D deficiency 11/25/2012  . Melanoma of skin, site unspecified 11/25/2012  . 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 Alcoholism in remission ([place]) 06/19/2011  . Diverticulitis of colon (without mention of hemorrhage)(562.11) 06/19/2011  . Bipolar I disorder ([place]) 06/19/2011  . Osteopenia 05/17/2010  . Alcohol abuse 05/16/2010  . HYPERTRIGLYCERIDEMIA 08/02/2009  . Macrocytic anemia 01/10/2009  . ABNORMAL ELECTROCARDIOGRAM 11/26/2008  . REFLUX, ESOPHAGEAL 11/17/2006  . COMMON MIGRAINE 09/22/2006    [facility] 05/20/2020, 9:51 AM  [facility] [address] [place], [name], [address] Phone: [phone]   Fax:  [phone]  Name: NICLAS MARKELL MRN: [id number] Date of Birth: Jul 22, 1950   Vianne Bulls, OTR/L [facility] [address] [place], [place]  [address] [phone] phone [phone] 05/20/20 9:52 AM

## 2020-05-20 NOTE — Telephone Encounter (Signed)
Transition Care Management Follow-up Telephone Call  Date of discharge and from where: 05/16/2020-[place]  How have you been since you were released from the hospital? Doing well. Started PT/OT/ST today  Any questions or concerns? No  Items Reviewed:  Did the pt receive and understand the discharge instructions provided? Yes   Medications obtained and verified? Yes   Other? Yes   Any new allergies since your discharge? No   Dietary orders reviewed? Yes  Do you have support at home? Yes   Home Care and Equipment/Supplies: Were home health services ordered? no If so, what is the name of the agency? n/a  Has the agency set up a time to come to the patient's home? not applicable Were any new equipment or medical supplies ordered?  No What is the name of the medical supply agency? n/a Were you able to get the supplies/equipment? not applicable Do you have any questions related to the use of the equipment or supplies? n/a  Functional Questionnaire: (I = Independent and D = Dependent) ADLs: I  Bathing/Dressing- I  Meal Prep- I  Eating- I  Maintaining continence- I  Transferring/Ambulation- I  Managing Meds- I  Follow up appointments reviewed:   PCP Hospital f/u appt confirmed? Yes  Scheduled to see Dr. Larose Kells on 05/22/20 @ 11:20.  [facility] f/u appt confirmed? No  Cardiology & Neurology to call patient  Are transportation arrangements needed? No   If their condition worsens, is the pt aware to call PCP or go to the Emergency Dept.? Yes  Was the patient provided with contact information for the PCP's office or ED? Yes  Was to pt encouraged to call back with questions or concerns? Yes

## 2020-05-20 NOTE — Therapy (Signed)
[facility] [address] [place], [name], [address] Phone: [phone]   Fax:  [phone]  Physical Therapy Evaluation  Patient Details  Name: Jeffrey Acosta MRN: [id number] Date of Birth: 03/10/1951 Referring Provider (PT): Jeffrey Acosta, Jeffrey Acosta/Dr. Kathlene Acosta, PCP   Encounter Date: 05/20/2020   PT End of Session - 05/20/20 1523    Visit Number 1    Number of Visits 18    Date for PT Re-Evaluation 36/64/40   90 day cert for 9 week POC   Authorization Type BCBS Medicare-$40 copay per discipline    PT Start Time 0720    PT Stop Time 0758    PT Time Calculation (min) 38 min    Equipment Utilized During Treatment Gait belt    Activity Tolerance Patient tolerated treatment well    Behavior During Therapy [facility] Acosta for tasks assessed/performed           Past Medical History:  Diagnosis Date  . Alcoholism in remission (Jeffrey Acosta)    in [place]  . Arrhythmia    atrial tachycardia  . Bipolar affective disorder ([place])   . Diverticulosis of colon (without mention of hemorrhage)   . GERD (gastroesophageal reflux disease)   . Gout of multiple sites   . Hiatal hernia   . Hx of migraines   . Hydrocele   . HYPERLIPIDEMIA 01/10/2009   Qualifier: Diagnosis of  By: Jeffrey Acosta CMA, Chemira    NMR Lipoprofile 2011 LDL could not be calculated  Due to TG of 889  (811  / 750 ),  HDL 50 . Father MI @ 70 PGM CVA early 58s; PGF CVA @72    . Hypothyroidism   . Melanoma of skin, site unspecified 11/25/2012   07/2011 abdominal  Stage 1 Dr Jeffrey Acosta Jeffrey Acosta Dr Jeffrey Acosta   . Osteopenia   . Polyarthralgia     Past Surgical History:  Procedure Laterality Date  . BILIARY BRUSHING  12/07/2018   Procedure: BILIARY BRUSHING;  Surgeon: Jeffrey Landmark Telford Nab., MD;  Location: Jeffrey Acosta Acosta;  Service: Gastroenterology;;  . BILIARY DILATION  12/07/2018   Procedure: BILIARY DILATION;  Surgeon: Jeffrey Acosta., MD;  Location: Jeffrey Acosta Acosta;  Service: Gastroenterology;;  . BIOPSY   12/07/2018   Procedure: BIOPSY;  Surgeon: Jeffrey Acosta., MD;  Location: Jeffrey Acosta;  Service: Gastroenterology;;  . COLONOSCOPY  2003   negative ; Dr Sharlett Iles  . ERCP N/A 12/07/2018   Procedure: ENDOSCOPIC RETROGRADE CHOLANGIOPANCREATOGRAPHY (ERCP);  Surgeon: Jeffrey Acosta., MD;  Location: Jeffrey Acosta Acosta;  Service: Gastroenterology;  Laterality: N/A;  . ESOPHAGOGASTRODUODENOSCOPY (EGD) WITH PROPOFOL N/A 12/07/2018   Procedure: ESOPHAGOGASTRODUODENOSCOPY (EGD) WITH PROPOFOL;  Surgeon: Jeffrey Landmark Telford Nab., MD;  Location: Jeffrey Acosta;  Service: Gastroenterology;  Laterality: N/A;  . EUS N/A 12/07/2018   Procedure: UPPER ENDOSCOPIC ULTRASOUND (EUS) RADIAL;  Surgeon: Jeffrey Landmark Telford Nab., MD;  Location: Jeffrey Acosta;  Service: Gastroenterology;  Laterality: N/A;  . INGUINAL HERNIA REPAIR  08/07/2011   Procedure: HERNIA REPAIR INGUINAL ADULT;  Surgeon: Jeffrey Bookbinder, MD;  Location: Jeffrey Acosta;  Service: General;  Laterality: Right;  . IR ANGIO INTRA EXTRACRAN SEL INTERNAL CAROTID BILAT MOD SED  05/14/2020  . IR ANGIO VERTEBRAL SEL VERTEBRAL BILAT MOD SED  05/14/2020  . MELANOMA EXCISION  07/28/11   Dr Jeffrey Acosta; stomach and chest  . REMOVAL OF STONES  12/07/2018   Procedure: REMOVAL OF STONES;  Surgeon: Jeffrey Landmark Telford Nab., MD;  Location: Jeffrey Acosta Acosta;  Service: Gastroenterology;;  . Jeffrey Acosta  1953  .  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 UPPER GASTROINTESTINAL Acosta  1983   hiatal hernia  . VASECTOMY  1979    There were no vitals filed for this visit.    Subjective Assessment - 05/20/20 0724    Subjective Originally went to hospital 04/30/2020, due to a fall at home.  Looking to get "back into the swing of things".  Larey Seat and hit my head, causing a brain bleed.  Feel weaker than I was.  One other fall in the past 6 months.  Prior to hospitalization, was independent and not using a walker.    Patient is accompained by: Family member    wife, Jeffrey Acosta   Patient Stated Goals Pt's goals for therapy are to be back to being independent and to get rid of walker.    Currently in Pain? No/denies              [facility] PT Assessment - 05/20/20 0727      Assessment   Medical Diagnosis Subarachnoid hemorrhage    Referring Provider (PT) Jeffrey Acosta, Jeffrey Acosta/Dr. Willow Acosta, PCP    Onset Date/Surgical Date 04/30/20    Hand Dominance Right    Prior Therapy hospitalizes 04/30/20-05/16/20      Precautions   Precautions Fall    Precaution Comments No driving currently after hospitalization      Balance Screen   Has the patient fallen in the past 6 months Yes    How many times? 2    Has the patient had a decrease in activity level because of a fear of falling?  No    Is the patient reluctant to leave their home because of a fear of falling?  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      Cognition   Overall Cognitive Status --   Reports extra time searching for phrases in conversation; wife reports decreased memory     Observation/Other Assessments   Focus on Therapeutic Outcomes (FOTO)  NA      ROM / Strength   AROM / PROM / Strength Strength;AROM      AROM   Overall AROM  Within functional limits for tasks performed   BLEs     Strength   Overall Strength Deficits    Strength Assessment Site Hip;Knee;Ankle    Right/Left Hip Right;Left    Right Hip Flexion 4+/5    Left Hip Flexion 4/5    Right/Left Knee  Right;Left    Right Knee Flexion 5/5    Right Knee Extension 4/5    Left Knee Flexion 5/5    Left Knee Extension 4/5    Right/Left Ankle Left;Right    Right Ankle Dorsiflexion 4/5    Left Ankle Dorsiflexion 4/5      Transfers   Transfers Sit to Stand;Stand to Sit  Sit to Stand 5: Supervision;Without upper extremity assist;From chair/3-in-1    Five time sit to stand comments  18.25    Stand to Sit 5: Supervision;Without upper extremity assist;To chair/3-in-1      Ambulation/Gait   Ambulation/Gait Yes    Ambulation/Gait Assistance 5: Supervision;4: Min guard    Ambulation Distance (Feet) 60 Feet   x 2   Assistive device Rolling walker    Gait Pattern Step-through pattern;Narrow base of support    Ambulation Surface Level;Indoor    Gait velocity 14.10 sec= 2.32 ft/sec      Standardized Balance Assessment   Standardized Balance Assessment Timed Up and Go Test;Berg Balance Test      Berg Balance Test   Sit to Stand Able to stand without using hands and stabilize independently    Standing Unsupported Able to stand 2 minutes with supervision    Sitting with Back Unsupported but Feet Supported on Floor or Stool Able to sit safely and securely 2 minutes    Stand to Sit Sits safely with minimal use of hands    Transfers Able to transfer safely, minor use of hands    Standing Unsupported with Eyes Closed Able to stand 10 seconds with supervision    Standing Unsupported with Feet Together Able to place feet together independently and stand for 1 minute with supervision    From Standing, Reach Forward with Outstretched Arm Can reach forward >12 cm safely (5")    From Standing Position, Pick up Object from [place] to pick up shoe, needs supervision    From Standing Position, Turn to Look Behind Over each Shoulder Looks behind from both sides and weight shifts well    Turn 360 Degrees Needs close supervision or verbal cueing    Standing Unsupported, Alternately Place Feet on Step/Stool  Able to complete >2 steps/needs minimal assist    Standing Unsupported, One Foot in Front Able to take small step independently and hold 30 seconds    Standing on One Leg Tries to lift leg/unable to hold 3 seconds but remains standing independently   2.09 RLE with LOB; 2.87 LLE   Total Score 40    Berg comment: Scores <45/56 indicate increased fall risk.      Timed Up and Go Test   Normal TUG (seconds) 16.72   RW   TUG Comments Scores >13.5 sec indicate increased fall risk                      Objective measurements completed on examination: See above findings.               PT Education - 05/20/20 1519    Education Details Eval results and PT POC    Person(s) Educated Patient;Spouse    Methods Explanation    Comprehension Verbalized understanding            PT Short Term Goals - 05/20/20 1543      PT SHORT TERM GOAL #1   Title Pt will be independent with HEP for improved strength, balance, transfers, and gait.  06/21/2020    Time 5    Period Weeks    Status New      PT SHORT TERM GOAL #2   Title Pt will improve 5x sit<>stand to less than or equal to 15 sec to demonstrate improved functional strength and transfer efficiency.    Baseline 18.25 sec    Time 5    Period Weeks    Status  New      PT SHORT TERM GOAL #3   Title Pt will improve TUG score to less than or equal to 13.5 sec for decreased fall risk.    Baseline 16.72 sec with RW    Time 5    Period Weeks    Status New      PT SHORT TERM GOAL #4   Title Pt will improve Berg score to at least 47/56 for decreased fall risk.    Baseline 40/56    Time 5    Period Weeks    Status New      PT SHORT TERM GOAL #5   Title Pt will ambulate short distances, 50-100 ft with SPC with supervision, for improved transition to more independent household gait.    Time 5    Period Weeks    Status New             PT Long Term Goals - 05/20/20 1546      PT LONG TERM GOAL #1   Title Pt will be  independent with progression of HEP for improved strength, balance, transfers, and gait.  TARGET 07/19/2020    Time 9    Period Weeks    Status New      PT LONG TERM GOAL #2   Title Pt will improve 5x sit<>stand to less than or equal to 12.5 sec to demonstrate improved functional strength and transfer efficiency.    Time 9    Period Weeks    Status New      PT LONG TERM GOAL #3   Title Pt will improve gait velocity to at least 2.62 ft/sec for improved gait efficiency and safety.    Time 9    Period Weeks    Status New      PT LONG TERM GOAL #4   Title Pt will be mod independent with outdoor gait, at least 1000 ft, using cane versus no device, for improved community gait.    Time 9    Period Weeks    Status New      PT LONG TERM GOAL #5   Title Pt will verbalize understanding of fall prevention in home environment.    Time 9    Period Weeks    Status New                  Plan - 05/20/20 1527    Clinical Impression Statement Pt is a 70 year old male with history of alcohol use, hypothyroidism, atrial tachycardia, Barrett's esophagus.  He experienced a collapse at home on 04/30/20, was found very quickly by his wife although should she did not see the actual fall.  He was unconscious but there was no evidence of seizure type activity.  He had slowly improving mental status on transportation to the ED, complained of severe headache.  Head CT showed diffuse SAH.  Reportedly, he left his hospital room on 05/11/2020 to go and smoke a cigarette outside.  Unfortunately, he was found down, outside of the hospital.  He was hypothermic and unresponsive.  He was transferred to the neuro ICU.  MRI brain showed small acute infarcts in the left basal ganglia, left periatrial temporal lobe and high bilateral frontoparietal regions.  Pt was discharged home 05/16/20.  He presents to OPPT today with decreased functional lower extremity strength, decreased balance, decreased safety awareness, decreased  independence with transfers and gait.  Prior to hospitalization, he was independent.  He would benefit from  skilled PT to address the above stated defictis to decrease fall risk and improve overall functional mobility and return to independence.    Personal Factors and Comorbidities Comorbidity 3+    Comorbidities See notes    Examination-Activity Limitations Locomotion Level;Transfers;Stand    Examination-Participation Restrictions Shop;Community Activity    Stability/Clinical Decision Making Evolving/Moderate complexity    Clinical Decision Making Moderate    Rehab Potential Good    PT Frequency 2x / week    PT Duration Other (comment)   for 8 weeks, plus 1 additional visit week of eval   PT Treatment/Interventions ADLs/Self Care Home Management;DME Instruction;Neuromuscular re-education;Balance training;Therapeutic exercise;Therapeutic activities;Functional mobility training;Stair training;Gait training;Patient/family education    PT Next Visit Plan Initiate HEP for functional strengthening, balance; gait training with RW>progression to cane as appropriate.    Consulted and Agree with Plan of Care Patient;Family member/caregiver    Family Member Consulted wife-Jeffrey Acosta           Patient will benefit from skilled therapeutic intervention in order to improve the following deficits and impairments:  Abnormal gait,Difficulty walking,Decreased safety awareness,Decreased balance,Decreased mobility,Decreased strength  Visit Diagnosis: Other abnormalities of gait and mobility  Muscle weakness (generalized)  Unsteadiness on feet     Problem List Patient Active Problem List   Diagnosis Date Noted  . Benign essential HTN 05/06/2020  . Subarachnoid hemorrhage ([place]) 04/30/2020  . Leukopenia 07/15/2018  . Thrombocytopenia ([place]) 07/15/2018  . PCP NOTES >>>>>>> 03/23/2015  . Annual physical exam 03/20/2015  . Bronchospasm 09/06/2014  . DJD (degenerative joint disease) 09/06/2014  .  Hypothyroidism 08/28/2014  . Tremor 07/29/2014  . Gait disorder 07/29/2014  . Gout 01/08/2014  . Vitamin D deficiency 11/25/2012  . Melanoma of skin, site unspecified 11/25/2012  . 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 Alcoholism in remission ([place]) 06/19/2011  . Diverticulitis of colon (without mention of hemorrhage)(562.11) 06/19/2011  . Bipolar I disorder ([place]) 06/19/2011  . Osteopenia 05/17/2010  . Alcohol abuse 05/16/2010  . HYPERTRIGLYCERIDEMIA 08/02/2009  . Macrocytic anemia 01/10/2009  . ABNORMAL ELECTROCARDIOGRAM 11/26/2008  . REFLUX, ESOPHAGEAL 11/17/2006  . COMMON MIGRAINE 09/22/2006    Hollan Philipp W. 05/20/2020, 3:51 PM  Frazier Butt., PT   [facility] [address] [place], [name], [address] Phone: [phone]   Fax:  [phone]  Name: Jeffrey Acosta MRN: [id number] Date of Birth: 30-Nov-1950

## 2020-05-22 ENCOUNTER — Encounter: Payer: Self-pay | Admitting: Internal Medicine

## 2020-05-22 ENCOUNTER — Other Ambulatory Visit: Payer: Self-pay

## 2020-05-22 ENCOUNTER — Ambulatory Visit (INDEPENDENT_AMBULATORY_CARE_PROVIDER_SITE_OTHER): Payer: Medicare Other | Admitting: Internal Medicine

## 2020-05-22 VITALS — BP 120/76 | HR 75 | Temp 97.6°F | Resp 18 | Ht 73.0 in | Wt 190.1 lb

## 2020-05-22 DIAGNOSIS — E785 Hyperlipidemia, unspecified: Secondary | ICD-10-CM

## 2020-05-22 DIAGNOSIS — I609 Nontraumatic subarachnoid hemorrhage, unspecified: Secondary | ICD-10-CM

## 2020-05-22 DIAGNOSIS — I10 Essential (primary) hypertension: Secondary | ICD-10-CM

## 2020-05-22 DIAGNOSIS — F1010 Alcohol abuse, uncomplicated: Secondary | ICD-10-CM

## 2020-05-22 DIAGNOSIS — I639 Cerebral infarction, unspecified: Secondary | ICD-10-CM | POA: Diagnosis not present

## 2020-05-22 DIAGNOSIS — Z8673 Personal history of transient ischemic attack (TIA), and cerebral infarction without residual deficits: Secondary | ICD-10-CM

## 2020-05-22 DIAGNOSIS — I6930 Unspecified sequelae of cerebral infarction: Secondary | ICD-10-CM

## 2020-05-22 LAB — COMPREHENSIVE METABOLIC PANEL
ALT: 15 U/L (ref 0–53)
AST: 16 U/L (ref 0–37)
Albumin: 3.9 g/dL (ref 3.5–5.2)
Alkaline Phosphatase: 52 U/L (ref 39–117)
BUN: 19 mg/dL (ref 6–23)
CO2: 26 mEq/L (ref 19–32)
Calcium: 10 mg/dL (ref 8.4–10.5)
Chloride: 107 mEq/L (ref 96–112)
Creatinine, Ser: 1.19 mg/dL (ref 0.40–1.50)
GFR: 61.39 mL/min (ref >60.00)
Glucose, Bld: 92 mg/dL (ref 70–99)
Potassium: 4.6 mEq/L (ref 3.5–5.1)
Sodium: 140 mEq/L (ref 135–145)
Total Bilirubin: 0.7 mg/dL (ref 0.2–1.2)
Total Protein: 5.9 g/dL — ABNORMAL LOW (ref 6.0–8.3)

## 2020-05-22 LAB — CBC WITH DIFFERENTIAL/PLATELET
Basophils Absolute: 0.1 10*3/uL (ref 0.0–0.1)
Basophils Relative: 0.9 % (ref 0.0–3.0)
Eosinophils Absolute: 0.2 10*3/uL (ref 0.0–0.7)
Eosinophils Relative: 3.1 % (ref 0.0–5.0)
HCT: 35.7 % — ABNORMAL LOW (ref 39.0–52.0)
Hemoglobin: 12.7 g/dL — ABNORMAL LOW (ref 13.0–17.0)
Lymphocytes Relative: 21.9 % (ref 12.0–46.0)
Lymphs Abs: 1.5 10*3/uL (ref 0.7–4.0)
MCHC: 35.6 g/dL (ref 30.0–36.0)
MCV: 101 fl — ABNORMAL HIGH (ref 78.0–100.0)
Monocytes Absolute: 0.5 10*3/uL (ref 0.1–1.0)
Monocytes Relative: 7.5 % (ref 3.0–12.0)
Neutro Abs: 4.6 10*3/uL (ref 1.4–7.7)
Neutrophils Relative %: 66.6 % (ref 43.0–77.0)
Platelets: 327 10*3/uL (ref 150.0–400.0)
RBC: 3.54 Mil/uL — ABNORMAL LOW (ref 4.22–5.81)
RDW: 13.6 % (ref 11.5–15.5)
WBC: 6.9 10*3/uL (ref 4.0–10.5)

## 2020-05-22 NOTE — Patient Instructions (Signed)
Check the  blood pressure daily BP GOAL is between 110/65 and  135/85. If it is consistently higher or lower, let me know  Do not take Indocin or any other anti-inflammatory (aspirin, ibuprofen, naproxen) Tylenol is okay   GO TO THE LAB : Get the blood work     [place], [place] back for a checkup in 3 months

## 2020-05-22 NOTE — Progress Notes (Signed)
Pre visit review using our clinic review tool, if applicable. No additional management support is needed unless otherwise documented below in the visit note. 

## 2020-05-22 NOTE — Progress Notes (Addendum)
Subjective:    Patient ID: Jeffrey Acosta, male    DOB: 12/18/1950, 70 y.o.   MRN: [id number]  DOS:  05/22/2020 Type of visit - description: Hospital follow-up, TCM 7  Patient was admitted to the hospital and discharge almost 2-1/2 weeks later on 05/16/2020: He was admitted after he collapsed at home, he was transported to the ED, complaining of a headache, CT showed with a diffuse SAH. Issue was suspected to be due to aneurysm rupture, neurosurgery indicated no need for intervention.  He left his hospital room 05/11/2020, went out to smoke a cigarette, was found outside, he was hypothermic and unresponsive. Transfer back to hospital ICU, MRI of the brain show small acute infarct in the left basilar ganglia, left temporal lobe of frontoparietal regions.  Diagnostic angiogram 05/14/2020, no aneurysm.  + moderate to severe vasospasm, they recommended a 3-week course of nimodipine for vasospasms.  Statins discontinue as they were contraindicated on intracranial hemorrhage.  EEG no seizures, Keppra was discontinued.   Review of Systems He is here with his wife. The patient reports that overall he feels weak but is doing a slow recovery. Denies fever chills No chest pain, difficulty breathing.  No palpitations No nausea vomiting he continue with a daily headache, this is going on since before he left the hospital, at times HA is intense.  Denies any nausea or vomiting.   Past Medical History:  Diagnosis Date   Alcoholism in remission ([place])    in [place]   Arrhythmia    atrial tachycardia   Bipolar affective disorder ([place])    Diverticulosis of colon (without mention of hemorrhage)    GERD (gastroesophageal reflux disease)    Gout of multiple sites    Hiatal hernia    Hx of migraines    Hydrocele    HYPERLIPIDEMIA 01/10/2009   Qualifier: Diagnosis of  By: Ronnald Ramp CMA, Chemira    NMR Lipoprofile 2011 LDL could not be calculated  Due to TG of 889  (811  / 750 ),  HDL 50 . Father MI  @ 47 PGM CVA early 69s; PGF CVA @72     Hypothyroidism    Melanoma of skin, site unspecified 11/25/2012   07/2011 abdominal  Stage 1 Dr Denicola Lacy [place] Dr Everlena Cooper    Polyarthralgia     Past Surgical History:  Procedure Laterality Date   BILIARY BRUSHING  12/07/2018   Procedure: BILIARY BRUSHING;  Surgeon: Irving Copas., MD;  Location: [facility];  Service: Gastroenterology;;   BILIARY DILATION  12/07/2018   Procedure: BILIARY DILATION;  Surgeon: Irving Copas., MD;  Location: [facility];  Service: Gastroenterology;;   BIOPSY  12/07/2018   Procedure: BIOPSY;  Surgeon: Irving Copas., MD;  Location: [facility];  Service: Gastroenterology;;   COLONOSCOPY  2003   negative ; Dr Sharlett Iles   ERCP N/A 12/07/2018   Procedure: ENDOSCOPIC RETROGRADE CHOLANGIOPANCREATOGRAPHY (ERCP);  Surgeon: Irving Copas., MD;  Location: [facility];  Service: Gastroenterology;  Laterality: N/A;   ESOPHAGOGASTRODUODENOSCOPY (EGD) WITH PROPOFOL N/A 12/07/2018   Procedure: ESOPHAGOGASTRODUODENOSCOPY (EGD) WITH PROPOFOL;  Surgeon: Rush Landmark Telford Nab., MD;  Location: [facility];  Service: Gastroenterology;  Laterality: N/A;   EUS N/A 12/07/2018   Procedure: UPPER ENDOSCOPIC ULTRASOUND (EUS) RADIAL;  Surgeon: Irving Copas., MD;  Location: [facility];  Service: Gastroenterology;  Laterality: N/A;   INGUINAL HERNIA REPAIR  08/07/2011   Procedure: HERNIA REPAIR INGUINAL ADULT;  Surgeon: Rolm Bookbinder, MD;  Location: [facility];  Service: General;  Laterality: Right;   IR ANGIO INTRA EXTRACRAN SEL INTERNAL CAROTID BILAT MOD SED  05/14/2020   IR ANGIO VERTEBRAL SEL VERTEBRAL BILAT MOD SED  05/14/2020   MELANOMA EXCISION  07/28/11   Dr Irene Limbo; stomach and chest   REMOVAL OF STONES  12/07/2018   Procedure: REMOVAL OF STONES;  Surgeon: Meridee Score Netty Starring., MD;  Location: [facility];  Service: Gastroenterology;;    TONSILLECTOMY  1951   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   UPPER GASTROINTESTINAL ENDOSCOPY  1983   hiatal hernia   VASECTOMY  1979    Allergies as of 05/22/2020      Reactions   Omnipaque [iohexol] Hives    Desc: developed hives after injection; resolved with 50 mg benadryl given to pt.      Medication List       Accurate as of May 22, 2020 11:59 PM. If you have any questions, ask your nurse or doctor.        STOP taking these medications   indomethacin 25 MG capsule Commonly known as: INDOCIN Stopped by: Willow Ora, MD     TAKE these medications   allopurinol 300 MG tablet Commonly known as: ZYLOPRIM Take 1 tablet (300 mg total) by mouth daily.   b complex vitamins capsule Take 1 capsule by mouth daily.   esomeprazole 40 MG capsule Commonly known as: NEXIUM Take 1 capsule (40 mg total) by mouth daily before breakfast.   folic acid 1 MG tablet Commonly known as: FOLVITE Take 1 tablet (1 mg total) by mouth daily.   lamoTRIgine 25 MG tablet Commonly known as: LAMICTAL Take 25 mg by mouth daily.   levothyroxine 50 MCG tablet Commonly known as: SYNTHROID Take 1 tablet (50 mcg total) by mouth daily before breakfast.   metoprolol succinate 100 MG 24 hr tablet Commonly known as: TOPROL-XL Take 1 tablet (100 mg total) by mouth daily. Take with or immediately following a meal.  Start taking from 2/8   metoprolol succinate 25 MG 24 hr tablet Commonly known as: TOPROL-XL Take 1 tablet (25 mg total) by mouth daily for 18 days.   multivitamin with minerals Tabs tablet Take 1 tablet by mouth daily.   naltrexone 50 MG tablet Commonly known as: DEPADE Take 50 mg by mouth daily.   niMODipine 30 MG capsule Commonly known as: NIMOTOP Take 2 capsules (60 mg total) by mouth every 4 (four) hours for 18 days.   QUEtiapine 400 MG tablet Commonly known as: SEROQUEL Take 800 mg by mouth at bedtime.   sodium bicarbonate 650 MG tablet Take 1 tablet  (650 mg total) by mouth 2 (two) times daily for 7 days.   tamsulosin 0.4 MG Caps capsule Commonly known as: FLOMAX Take 1 capsule (0.4 mg total) by mouth daily after supper.   timolol 0.5 % ophthalmic solution Commonly known as: TIMOPTIC 1 drop 2 (two) times daily.   vitamin B-1 250 MG tablet Take 1 tablet (250 mg total) by mouth daily.   vitamin B-12 500 MCG tablet Commonly known as: CYANOCOBALAMIN Take 500 mcg by mouth daily.   Vitamin D 50 MCG (2000 UT) tablet Take 2,000 Units by mouth daily.          Objective:   Physical Exam BP 120/76 (BP Location: Left Arm, Patient Position: Sitting, Cuff Size: Normal)    Pulse 75    Temp 97.6 F (36.4 C) (Oral)    Resp 18  Ht 6\' 1"  (1.854 m)    Wt 190 lb 2 oz (86.2 kg)    SpO2 100%    BMI 25.08 kg/m  General:   Well developed, NAD, BMI noted. HEENT:  Normocephalic . Face symmetric, atraumatic Lungs:  CTA B Normal respiratory effort, no intercostal retractions, no accessory muscle use. Heart: RRR,  no murmur.  Lower extremities: no pretibial edema bilaterally  Skin: Not pale. Not jaundice Neurologic:  alert & oriented X3.  Slightly slowed usual processing. Speech normal and clear, gait assisted by a walker. EOMI, short-lived nystagmus?Marland Kitchen Psych--  Cognition and judgment appear intact.  Cooperative with normal attention span and concentration.  Behavior appropriate. No anxious or depressed appearing.      Assessment     Assessment  Bipolar disorder-- Dr Toy Care, dc lithium 07-2014 EtOH abuse-- remission w/ occ relapse Hypothyroidism Hyperlipidemia E.D.  Atrial Tachycardia dx 03/2019, + Zio patch,  MSK: ---Back pain: Saw Dr. Ramos/Geoffrey~ 2002, 3 local injections, offered surgery.  ---Gout: Dr Berna Bue, released to PCP 2018 Melanoma 2014 Dr. [place], sees derm regularly as off 02-2020 Osteopenia  T score -2.4  ( [phone];  T score is -2.0  (08/2018). Vit d def dx 02-2015 GI:  -GERD with Barrett's per EGD  11-2018 - History of diverticulitis -11-2018: Mild to moderate biliary dilation without definitive cause found status post EUS and ERCP with sphincterotomy and negative brushings LUTS   PLAN TCM 7 SAH, acute stroke: Discharged home few days ago, see summary of admission above Currently not on aspirin or statins due to recent [facility] He seems to be recovering gradually. Doing PT, ST, OT at the neurology office. Has a headache, since before he left the hospital, reminded not to take any NSAIDs, Tylenol is okay, encouraged to discuss with neuro. Already set up a follow-up with neurosurgery, cardiology sent a monitor, patient will call neurology to set up follow-up Check a CMP and CBC. HTN:  Metoprolol dose decreased temporarily to 25 mg while he takes his 3-week course of nimodipine.  After that restart the 100 mg daily of metoprolol.  This plan was discussed with the patient and his wife and they are very clear on what to do. EtOH: Alcohol level upon recent admission was 220.  Encouraged abstinence, advised wife to monitor the situation.  He plans to go to [employer] as soon as he can. Hyperlipidemia: Off statins due to recent [facility] RTC 3 months    This visit occurred during the SARS-CoV-2 public health emergency.  Safety protocols were in place, including screening questions prior to the visit, additional usage of staff PPE, and extensive cleaning of exam room while observing appropriate contact time as indicated for disinfecting solutions.

## 2020-05-23 DIAGNOSIS — E785 Hyperlipidemia, unspecified: Secondary | ICD-10-CM | POA: Insufficient documentation

## 2020-05-23 NOTE — Assessment & Plan Note (Signed)
TCM 7 SAH, acute stroke: Discharged home few days ago, see summary of admission above Currently not on aspirin or statins due to recent [facility] He seems to be recovering gradually. Doing PT, ST, OT at the neurology office. Has a headache, since before he left the hospital, reminded not to take any NSAIDs, Tylenol is okay, encouraged to discuss with neuro. Already set up a follow-up with neurosurgery, cardiology sent a monitor, patient will call neurology to set up follow-up Check a CMP and CBC. HTN:  Metoprolol dose decreased temporarily to 25 mg while he takes his 3-week course of nimodipine.  After that restart the 100 mg daily of metoprolol.  This plan was discussed with the patient and his wife and they are very clear on what to do. EtOH: Alcohol level upon recent admission was 220.  Encouraged abstinence, advised wife to monitor the situation.  He plans to go to [employer] as soon as he can. Hyperlipidemia: Off statins due to recent [facility] 3 months

## 2020-05-27 ENCOUNTER — Encounter: Payer: Self-pay | Admitting: Physical Therapy

## 2020-05-27 ENCOUNTER — Other Ambulatory Visit: Payer: Self-pay

## 2020-05-27 ENCOUNTER — Ambulatory Visit: Payer: Medicare Other

## 2020-05-27 ENCOUNTER — Ambulatory Visit: Payer: Medicare Other | Admitting: Physical Therapy

## 2020-05-27 ENCOUNTER — Ambulatory Visit: Payer: Medicare Other | Admitting: Occupational Therapy

## 2020-05-27 DIAGNOSIS — R2689 Other abnormalities of gait and mobility: Secondary | ICD-10-CM | POA: Diagnosis not present

## 2020-05-27 DIAGNOSIS — M6281 Muscle weakness (generalized): Secondary | ICD-10-CM

## 2020-05-27 DIAGNOSIS — R4701 Aphasia: Secondary | ICD-10-CM | POA: Diagnosis not present

## 2020-05-27 DIAGNOSIS — R2681 Unsteadiness on feet: Secondary | ICD-10-CM

## 2020-05-27 DIAGNOSIS — R41841 Cognitive communication deficit: Secondary | ICD-10-CM | POA: Diagnosis not present

## 2020-05-27 DIAGNOSIS — I69018 Other symptoms and signs involving cognitive functions following nontraumatic subarachnoid hemorrhage: Secondary | ICD-10-CM

## 2020-05-27 NOTE — Therapy (Signed)
[facility] [address] [place], [name], [address] Phone: [phone]   Fax:  [phone]  Occupational Therapy Treatment  Patient Details  Name: Jeffrey Acosta MRN: [id number] Date of Birth: Mar 29, 1951 Referring Provider (OT): Dr. Florencia Reasons   Encounter Date: 05/27/2020   OT End of Session - 05/27/20 0801    Visit Number 2    Number of Visits 17    Date for OT Re-Evaluation 07/19/20    Authorization Type BCBS Medicare, copay per disc, no auth/VL (cognitive retraining not covered)    Authorization Time Period cert. date 05/21/19-08/18/20    Authorization - Visit Number 2    Authorization - Number of Visits 10    Progress Note Due on Visit 10   PN needed   OT Start Time 0802    OT Stop Time 0845    OT Time Calculation (min) 43 min    Activity Tolerance Patient tolerated treatment well    Behavior During Therapy Impulsive           Past Medical History:  Diagnosis Date  . Alcoholism in remission ([place])    in [place]  . Arrhythmia    atrial tachycardia  . Bipolar affective disorder ([place])   . Diverticulosis of colon (without mention of hemorrhage)   . GERD (gastroesophageal reflux disease)   . Gout of multiple sites   . Hiatal hernia   . Hx of migraines   . Hydrocele   . HYPERLIPIDEMIA 01/10/2009   Qualifier: Diagnosis of  By: Ronnald Ramp CMA, Chemira    NMR Lipoprofile 2011 LDL could not be calculated  Due to TG of 889  (811  / 750 ),  HDL 50 . Father MI @ 61 PGM CVA early 61s; PGF CVA @72    . Hypothyroidism   . Melanoma of skin, site unspecified 11/25/2012   07/2011 abdominal  Stage 1 Dr Amy [place] Dr Sarajane Jews   . Osteopenia   . Polyarthralgia     Past Surgical History:  Procedure Laterality Date  . BILIARY BRUSHING  12/07/2018   Procedure: BILIARY BRUSHING;  Surgeon: Rush Landmark Telford Nab., MD;  Location: [facility];  Service: Gastroenterology;;  . BILIARY DILATION  12/07/2018   Procedure: BILIARY DILATION;   Surgeon: Irving Copas., MD;  Location: [facility];  Service: Gastroenterology;;  . BIOPSY  12/07/2018   Procedure: BIOPSY;  Surgeon: Irving Copas., MD;  Location: [facility];  Service: Gastroenterology;;  . COLONOSCOPY  2003   negative ; Dr Sharlett Iles  . ERCP N/A 12/07/2018   Procedure: ENDOSCOPIC RETROGRADE CHOLANGIOPANCREATOGRAPHY (ERCP);  Surgeon: Irving Copas., MD;  Location: [facility];  Service: Gastroenterology;  Laterality: N/A;  . ESOPHAGOGASTRODUODENOSCOPY (EGD) WITH PROPOFOL N/A 12/07/2018   Procedure: ESOPHAGOGASTRODUODENOSCOPY (EGD) WITH PROPOFOL;  Surgeon: Rush Landmark Telford Nab., MD;  [facility];  Service: Gastroenterology;  Laterality: N/A;  . EUS N/A 12/07/2018   Procedure: UPPER ENDOSCOPIC ULTRASOUND (EUS) RADIAL;  Surgeon: Rush Landmark Telford Nab., MD;  [facility];  Service: Gastroenterology;  Laterality: N/A;  . INGUINAL HERNIA REPAIR  08/07/2011   Procedure: HERNIA REPAIR INGUINAL ADULT;  Surgeon: Rolm Bookbinder, MD;  Location: [facility];  Service: General;  Laterality: Right;  . IR ANGIO INTRA EXTRACRAN SEL INTERNAL CAROTID BILAT MOD SED  05/14/2020  . IR ANGIO VERTEBRAL SEL VERTEBRAL BILAT MOD SED  05/14/2020  . MELANOMA EXCISION  07/28/11   Dr Sarajane Jews; stomach and chest  . REMOVAL OF STONES  12/07/2018   Procedure:  REMOVAL OF STONES;  Surgeon: Mansouraty, Telford Nab., MD;  Location: [facility];  Service: Gastroenterology;;  . [name]  1953  . 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 UPPER GASTROINTESTINAL ENDOSCOPY  1983   hiatal hernia  . VASECTOMY  1979    There were no vitals filed for this visit.   Subjective Assessment - 05/27/20 0804    Patient is accompanied by: Family member   wife   Pertinent History subarachnoid hemorrhage (CT 04/30/20 showed moderate to large acute SAH; 05/11/20 MRI brain showed small acute infarcts in L basal ganglia, L periatrial temporal lobe and high  bilateral frontoparietal regions).  PMH:  hx of alcohol use with intoxication on hospital presentation, hypothyroidism, atrial tachycardia, Barrett's esophagus, hx of atrial tachycardia, osteopenia, thrombocytopenia, DJD, gout, melanoma of skin, bipolar disorder    Patient Stated Goals return to previous activities    Currently in Pain? No/denies             Copying PVC designs for functional problem solving and attention (min distracting environment).  Pt copied first design with incr time (simple design) and then 2nd design with min cueing for error (more complex design).  Constat therapy:  Alternating Symbol Match Level 4 with 96% accuracy and 110.54 average speed.    In standing, stepping over box (4") to perform functional reaching with each LE/UE for incr balance and alternating attention with min cueing for mild impulsivity/to slow down and CGA for balance (no LOB).      OT Education - 05/27/20 0818    Education Details Yellow theraband HEP--see pt instructions    Person(s) Educated Patient;Spouse    Methods Explanation;Demonstration;Handout;Verbal cues    Comprehension Verbalized understanding;Returned demonstration            OT Short Term Goals - 05/20/20 0918      OT SHORT TERM GOAL #1   Title Pt will be independent with HEP for bilateral shoulder strength.--check STGs 06/20/20    Time 4    Period Weeks    Status New      OT SHORT TERM GOAL #2   Title Pt will be able to perform shower transfer with supervision.    Time 4    Period Weeks    Status New      OT SHORT TERM GOAL #3   Title Pt will be able to perform simple alternating attention with at least 90% accuracy for incr safety.    Time 4    Period Weeks    Status New      OT SHORT TERM GOAL #4   Title Pt will perform mod complex functional problem solving with at least 90% accuracy.    Time 4    Period Weeks    Status New      OT SHORT TERM GOAL #5   Title Pt will verbalize understanding of  cognitive compensation strategies for ADLs/IADLs.    Time 4    Period Weeks    Status New             OT Long Term Goals - 05/20/20 0925      OT LONG TERM GOAL #1   Title Pt will able to reach at least 10" bilaterally on standing functional reach test without cueing/impulsivity for improved safety for IADLs.    Time 8    Period Weeks    Status New      OT LONG TERM GOAL #2   Title Pt  will demo good safety awareness for retrieving items from low shelf/floor mod I/without cueing.    Time 8    Period Weeks    Status New      OT LONG TERM GOAL #3   Title Pt will perform simple prior cooking/home maintenance tasks mod I and demonstrating good safety awareness/problem solving.    Time 8    Period Weeks    Status New      OT LONG TERM GOAL #4   Title Pt will perform simple divided attention with at least 1 physical and 1 cognitive tasks for improved safety.    Time 8    Period Weeks    Status New      OT LONG TERM GOAL #5   Title Pt will perform shower transfer mod I.    Time 8    Period Weeks    Status New                 Plan - 05/27/20 0801    Clinical Impression Statement Pt is progressing towards goals.  Pt continues to demo impulsivity with movement at times and was cued to slow down for safety with balance activities.  Pt also continues to demo min incr time/cueing for cognitive tasks and demo decr awareness.    OT Occupational Profile and History Detailed Assessment- Review of Records and additional review of physical, cognitive, psychosocial history related to current functional performance    Occupational performance deficits (Please refer to evaluation for details): ADL's;IADL's    Body Structure / Function / Physical Skills ADL;Strength;Balance;IADL;Endurance;Mobility;Coordination;Decreased knowledge of precautions    Cognitive Skills Problem Solve;Safety Awareness;Attention;Memory;Sequencing    Rehab Potential Good    Clinical Decision Making Several  treatment options, min-mod task modification necessary    Comorbidities Affecting Occupational Performance: May have comorbidities impacting occupational performance    Modification or Assistance to Complete Evaluation  Min-Moderate modification of tasks or assist with assess necessary to complete eval    OT Frequency 2x / week    OT Duration 8 weeks   +eval or 17 total visits   OT Treatment/Interventions Self-care/ADL training;DME and/or AE instruction;Balance training;Therapeutic activities;Cognitive remediation/compensation;Therapeutic exercise;Neuromuscular education;Functional Mobility Training;Patient/family education;Manual Therapy;Passive range of motion;Energy conservation;Cryotherapy;Moist Heat    Plan review theraband HEP, continue to address cognition in functional context ("Planning/organizing your day")    Consulted and Agree with Plan of Care Patient;Family member/caregiver    Family Member Consulted wife           Patient will benefit from skilled therapeutic intervention in order to improve the following deficits and impairments:   Body Structure / Function / Physical Skills: ADL,Strength,Balance,IADL,Endurance,Mobility,Coordination,Decreased knowledge of precautions Cognitive Skills: Problem Solve,Safety Awareness,Attention,Memory,Sequencing     Visit Diagnosis: Muscle weakness (generalized)  Other symptoms and signs involving cognitive functions following nontraumatic subarachnoid hemorrhage  Unsteadiness on feet    Problem List Patient Active Problem List   Diagnosis Date Noted  . Dyslipidemia 05/23/2020  . Benign essential HTN 05/06/2020  . Subarachnoid hemorrhage ([place]) 04/30/2020  . Leukopenia 07/15/2018  . Thrombocytopenia ([place]) 07/15/2018  . PCP NOTES >>>>>>> 03/23/2015  . Annual physical exam 03/20/2015  . Bronchospasm 09/06/2014  . DJD (degenerative joint disease) 09/06/2014  . Hypothyroidism 08/28/2014  . Tremor 07/29/2014  . Gait disorder  07/29/2014  . Gout 01/08/2014  . Vitamin D deficiency 11/25/2012  . Melanoma of skin, site unspecified 11/25/2012  . 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 Alcoholism in remission ([place]) 06/19/2011  . Diverticulitis of  colon (without mention of hemorrhage)(562.11) 06/19/2011  . Bipolar I disorder ([place]) 06/19/2011  . Osteopenia 05/17/2010  . Alcohol abuse 05/16/2010  . HYPERTRIGLYCERIDEMIA 08/02/2009  . Macrocytic anemia 01/10/2009  . ABNORMAL ELECTROCARDIOGRAM 11/26/2008  . REFLUX, ESOPHAGEAL 11/17/2006  . COMMON MIGRAINE 09/22/2006    [facility] 05/27/2020, 9:09 AM  [facility] [address] [place], [name], [address] Phone: [phone]   Fax:  [phone]  Name: Jeffrey Acosta MRN: [id number] Date of Birth: [birth year]   Vianne Bulls, OTR/L [facility] [address] [place], [place]  [address] [phone] phone [phone] 05/27/20 9:09 AM

## 2020-05-27 NOTE — Therapy (Signed)
[facility] [address] [place], [place], [address] Phone: [phone]   Fax:  [phone]  Physical Therapy Treatment  Patient Details  Name: Jeffrey Acosta MRN: [id number] Date of Birth: 04/01/1951 Referring Provider (PT): Roda Shutters, Fang/Dr. Willow Ora, PCP   Encounter Date: 05/27/2020   PT End of Session - 05/27/20 1015    Visit Number 2    Number of Visits 18    Date for PT Re-Evaluation 08/18/20    Authorization Type BCBS Medicare-$40 copay per discipline    PT Start Time 0935    PT Stop Time 1014    PT Time Calculation (min) 39 min    Behavior During Therapy Impulsive           Past Medical History:  Diagnosis Date  . Alcoholism in remission ([facility])    in [place]  . Arrhythmia    atrial tachycardia  . Bipolar affective disorder ([facility])   . Diverticulosis of colon (without mention of hemorrhage)   . GERD (gastroesophageal reflux disease)   . Gout of multiple sites   . Hiatal hernia   . Hx of migraines   . Hydrocele   . HYPERLIPIDEMIA 01/10/2009   Qualifier: Diagnosis of  By: Yetta Barre CMA, Chemira    NMR Lipoprofile 2011 LDL could not be calculated  Due to TG of 889  (811  / 750 ),  HDL 50 . Father MI @ 71 PGM CVA early 29s; PGF CVA @72    . Hypothyroidism   . Melanoma of skin, site unspecified 11/25/2012   07/2011 abdominal  Stage 1 Dr Amy [place] Dr Irene Limbo   . Osteopenia   . Polyarthralgia     Past Surgical History:  Procedure Laterality Date  . BILIARY BRUSHING  12/07/2018   Procedure: BILIARY BRUSHING;  Surgeon: Meridee Score Netty Starring., MD;  Location: [facility];  Service: Gastroenterology;;  . BILIARY DILATION  12/07/2018   Procedure: BILIARY DILATION;  Surgeon: Lemar Lofty., MD;  Location: [facility];  Service: Gastroenterology;;  . BIOPSY  12/07/2018   Procedure: BIOPSY;  Surgeon: Lemar Lofty., MD;  Location: [facility];  Service: Gastroenterology;;  . COLONOSCOPY  2003   negative ; Dr  Jarold Motto  . ERCP N/A 12/07/2018   Procedure: ENDOSCOPIC RETROGRADE CHOLANGIOPANCREATOGRAPHY (ERCP);  Surgeon: Lemar Lofty., MD;  Location: [facility];  Service: Gastroenterology;  Laterality: N/A;  . ESOPHAGOGASTRODUODENOSCOPY (EGD) WITH PROPOFOL N/A 12/07/2018   Procedure: ESOPHAGOGASTRODUODENOSCOPY (EGD) WITH PROPOFOL;  Surgeon: Meridee Score Netty Starring., MD;  Location[facility];  Service: Gastroenterology;  Laterality: N/A;  . EUS N/A 12/07/2018   Procedure: UPPER ENDOSCOPIC ULTRASOUND (EUS) RADIAL;  Surgeon: Meridee Score Netty Starring., MD;  Location: [facility];  Service: Gastroenterology;  Laterality: N/A;  . INGUINAL HERNIA REPAIR  08/07/2011   Procedure: HERNIA REPAIR INGUINAL ADULT;  Surgeon: Emelia Loron, MD;  [facility];  Service: General;  Laterality: Right;  . IR ANGIO INTRA EXTRACRAN SEL INTERNAL CAROTID BILAT MOD SED  05/14/2020  . IR ANGIO VERTEBRAL SEL VERTEBRAL BILAT MOD SED  05/14/2020  . MELANOMA EXCISION  07/28/11   Dr Irene Limbo; stomach and chest  . REMOVAL OF STONES  12/07/2018   Procedure: REMOVAL OF STONES;  Surgeon: Meridee Score Netty Starring., MD;  Location: [facility];  Service: Gastroenterology;;  . Sigurd Sos  1951  . 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 UPPER GASTROINTESTINAL ENDOSCOPY  1983   hiatal hernia  . VASECTOMY  1979  There were no vitals filed for this visit.   Subjective Assessment - 05/27/20 0936    Subjective Pt reports strength and cogitions he notices improvements in the last week.    Currently in Pain? No/denies              Pt performed exercises below at counter with cues for technique and safety requiring 1 UE support to intermittent UE support.   Walking March - 1-2 x daily - 5 x weekly - 2 sets - 4 reps - 2 hold Standing Tandem Balance with Counter Support - 1-2 x daily - 5 x weekly - 2 sets - 4 reps - 30 hold Standing Single Leg Stance with Counter Support - 1-2 x daily - 5 x  weekly - 1-2 sets - 4 reps - 15 hold Heel Walking - 1-2 x daily - 5 x weekly - 2 sets - 4 reps               [facility] Adult PT Treatment/Exercise - 05/27/20 0001      Ambulation/Gait   Ambulation/Gait Yes    Ambulation/Gait Assistance 4: Min guard   working on balance with changes in speed, direction, visual scanning   Ambulation Distance (Feet) 230 Feet    Assistive device None    Gait Pattern Step-through pattern;Narrow base of support    Ambulation Surface Level;Indoor               Balance Exercises - 05/27/20 0001      Balance Exercises: Standing   Step Over Hurdles / Cones alternate tapping cones working on multitasking and modified SLS, min guard.             PT Education - 05/27/20 1024    Education Details HEP for balance and strengthening at counter. Reviewed safety recommendation to continue to use walker during all gait at this time.    Person(s) Educated Patient;Spouse    Methods Explanation;Demonstration;Verbal cues;Handout    Comprehension Verbalized understanding;Returned demonstration;Verbal cues required            PT Short Term Goals - 05/20/20 1543      PT SHORT TERM GOAL #1   Title Pt will be independent with HEP for improved strength, balance, transfers, and gait.  06/21/2020    Time 5    Period Weeks    Status New      PT SHORT TERM GOAL #2   Title Pt will improve 5x sit<>stand to less than or equal to 15 sec to demonstrate improved functional strength and transfer efficiency.    Baseline 18.25 sec    Time 5    Period Weeks    Status New      PT SHORT TERM GOAL #3   Title Pt will improve TUG score to less than or equal to 13.5 sec for decreased fall risk.    Baseline 16.72 sec with RW    Time 5    Period Weeks    Status New      PT SHORT TERM GOAL #4   Title Pt will improve Berg score to at least 47/56 for decreased fall risk.    Baseline 40/56    Time 5    Period Weeks    Status New      PT SHORT TERM GOAL #5   Title  Pt will ambulate short distances, 50-100 ft with SPC with supervision, for improved transition to more independent household gait.    Time 5  Period Weeks    Status New             PT Long Term Goals - 05/20/20 1546      PT LONG TERM GOAL #1   Title Pt will be independent with progression of HEP for improved strength, balance, transfers, and gait.  TARGET 07/19/2020    Time 9    Period Weeks    Status New      PT LONG TERM GOAL #2   Title Pt will improve 5x sit<>stand to less than or equal to 12.5 sec to demonstrate improved functional strength and transfer efficiency.    Time 9    Period Weeks    Status New      PT LONG TERM GOAL #3   Title Pt will improve gait velocity to at least 2.62 ft/sec for improved gait efficiency and safety.    Time 9    Period Weeks    Status New      PT LONG TERM GOAL #4   Title Pt will be mod independent with outdoor gait, at least 1000 ft, using cane versus no device, for improved community gait.    Time 9    Period Weeks    Status New      PT LONG TERM GOAL #5   Title Pt will verbalize understanding of fall prevention in home environment.    Time 9    Period Weeks    Status New                 Plan - 05/27/20 1246    Clinical Impression Statement Skilled session focused on initiating HEP for standing balance and LE strength at counter; pt required 1 UE support to intermittent support performing at supervision level. Gait training, performed without AD working on changes in speed direction and visual scanning at min guard level; pt had no major LOB. Balance training, worked also on SLS stance (tapping cones) and multitasking without AD; pt required  Min guard and cues for recalling directions to task.    Personal Factors and Comorbidities Comorbidity 3+    Comorbidities See notes    Examination-Activity Limitations Locomotion Level;Transfers;Stand    Examination-Participation Restrictions Shop;Community Activity     Stability/Clinical Decision Making Evolving/Moderate complexity    Rehab Potential Good    PT Frequency 2x / week    PT Duration Other (comment)   for 8 weeks, plus 1 additional visit week of eval   PT Treatment/Interventions ADLs/Self Care Home Management;DME Instruction;Neuromuscular re-education;Balance training;Therapeutic exercise;Therapeutic activities;Functional mobility training;Stair training;Gait training;Patient/family education    PT Next Visit Plan Initiate HEP for functional strengthening, balance; gait training with RW>progression to cane as appropriate.    Consulted and Agree with Plan of Care Patient;Family member/caregiver    Family Member Consulted wife-[name]           Patient will benefit from skilled therapeutic intervention in order to improve the following deficits and impairments:  Abnormal gait,Difficulty walking,Decreased safety awareness,Decreased balance,Decreased mobility,Decreased strength  Visit Diagnosis: Other abnormalities of gait and mobility  Muscle weakness (generalized)  Unsteadiness on feet     Problem List Patient Active Problem List   Diagnosis Date Noted  . Dyslipidemia 05/23/2020  . Benign essential HTN 05/06/2020  . Subarachnoid hemorrhage ([place]) 04/30/2020  . Leukopenia 07/15/2018  . Thrombocytopenia ([place]) 07/15/2018  . PCP NOTES >>>>>>> 03/23/2015  . Annual physical exam 03/20/2015  . Bronchospasm 09/06/2014  . DJD (degenerative joint disease) 09/06/2014  . Hypothyroidism 08/28/2014  .  Tremor 07/29/2014  . Gait disorder 07/29/2014  . Gout 01/08/2014  . Vitamin D deficiency 11/25/2012  . Melanoma of skin, site unspecified 11/25/2012  . 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 Alcoholism in remission ([place]) 06/19/2011  . Diverticulitis of colon (without mention of hemorrhage)(562.11) 06/19/2011  . Bipolar I disorder ([place]) 06/19/2011  . Osteopenia 05/17/2010  . Alcohol abuse 05/16/2010  . HYPERTRIGLYCERIDEMIA 08/02/2009  .  Macrocytic anemia 01/10/2009  . ABNORMAL ELECTROCARDIOGRAM 11/26/2008  . REFLUX, ESOPHAGEAL 11/17/2006  . COMMON MIGRAINE 09/22/2006    Bjorn Loser, PTA  05/27/20, 1:00 PM [facility] [address] [place], [name], [address] Phone: [phone]   Fax:  [phone]  Name: ROCKNEY GRENZ MRN: [id number] Date of Birth: Aug 27, 1950

## 2020-05-27 NOTE — Patient Instructions (Signed)
   Strengthening: Resisted Flexion   Attach tube to door.  Hold tubing with one arm at side. Pull forward and up with elbow straight. Move shoulder through pain-free range of motion, no further than shoulder height. Repeat 15 times per set.  Do 1-2 sessions per day.    Strengthening: Resisted Extension   Attach one end to door.  Hold tubing in one hand, arm forward. Pull arm back, elbow straight. Repeat 15 times per set. Do 1-2 sessions per day.   Resisted Horizontal Abduction: Bilateral   Sit or stand, tubing in both hands, palms down and arms out in front. Keeping arms straight, pinch shoulder blades together and stretch arms out. Repeat 15 times per set.  Do 1-2 sessions per day.

## 2020-05-27 NOTE — Patient Instructions (Signed)
Access Code: [email or web address] URL: [email or web address] Date: 05/27/2020 Prepared by: Oneita Kras  Exercises Walking March - 1-2 x daily - 5 x weekly - 2 sets - 4 reps - 2 hold Standing Tandem Balance with Counter Support - 1-2 x daily - 5 x weekly - 2 sets - 4 reps - 30 hold Standing Single Leg Stance with Counter Support - 1-2 x daily - 5 x weekly - 1-2 sets - 4 reps - 15 hold Heel Walking - 1-2 x daily - 5 x weekly - 2 sets - 4 reps

## 2020-05-28 NOTE — Therapy (Signed)
[facility] [address] [place], [name], [address] Phone: [phone]   Fax:  [phone]  Speech Language Pathology Treatment  Patient Details  Name: Jeffrey Acosta MRN: [id number] Date of Birth: 15-Sep-1950 Referring Provider (SLP): Florencia Reasons (referring); Deetta Perla, MD (doc)   Encounter Date: 05/27/2020   End of Session - 05/28/20 0019    Visit Number 2    Number of Visits 17    Date for SLP Re-Evaluation 08/16/20    SLP Start Time 0853    SLP Stop Time  0933    SLP Time Calculation (min) 40 min    Activity Tolerance Patient tolerated treatment well           Past Medical History:  Diagnosis Date  . Alcoholism in remission ([place])    in [place]  . Arrhythmia    atrial tachycardia  . Bipolar affective disorder ([place])   . Diverticulosis of colon (without mention of hemorrhage)   . GERD (gastroesophageal reflux disease)   . Gout of multiple sites   . Hiatal hernia   . Hx of migraines   . Hydrocele   . HYPERLIPIDEMIA 01/10/2009   Qualifier: Diagnosis of  By: Ronnald Ramp CMA, Chemira    NMR Lipoprofile 2011 LDL could not be calculated  Due to TG of 889  (811  / 750 ),  HDL 50 . Father MI @ 64 PGM CVA early 14s; PGF CVA @72    . Hypothyroidism   . Melanoma of skin, site unspecified 11/25/2012   07/2011 abdominal  Stage 1 Dr Amy [place] Dr Sarajane Jews   . Osteopenia   . Polyarthralgia     Past Surgical History:  Procedure Laterality Date  . BILIARY BRUSHING  12/07/2018   Procedure: BILIARY BRUSHING;  Surgeon: Rush Landmark Telford Nab., MD;  Location: [facility];  Service: Gastroenterology;;  . BILIARY DILATION  12/07/2018   Procedure: BILIARY DILATION;  Surgeon: Irving Copas., MD;  Location: [facility];  Service: Gastroenterology;;  . BIOPSY  12/07/2018   Procedure: BIOPSY;  Surgeon: Irving Copas., MD;  Location: [facility];  Service: Gastroenterology;;  . COLONOSCOPY  2003   negative ; Dr Sharlett Iles   . ERCP N/A 12/07/2018   Procedure: ENDOSCOPIC RETROGRADE CHOLANGIOPANCREATOGRAPHY (ERCP);  Surgeon: Irving Copas., MD;  Location: [facility];  Service: Gastroenterology;  Laterality: N/A;  . ESOPHAGOGASTRODUODENOSCOPY (EGD) WITH PROPOFOL N/A 12/07/2018   Procedure: ESOPHAGOGASTRODUODENOSCOPY (EGD) WITH PROPOFOL;  Surgeon: Rush Landmark Telford Nab., MD;  [facility];  Service: Gastroenterology;  Laterality: N/A;  . EUS N/A 12/07/2018   Procedure: UPPER ENDOSCOPIC ULTRASOUND (EUS) RADIAL;  Surgeon: Rush Landmark Telford Nab., MD;  Location: [facility];  Service: Gastroenterology;  Laterality: N/A;  . INGUINAL HERNIA REPAIR  08/07/2011   Procedure: HERNIA REPAIR INGUINAL ADULT;  Surgeon: Rolm Bookbinder, MD;  Location: [facility];  Service: General;  Laterality: Right;  . IR ANGIO INTRA EXTRACRAN SEL INTERNAL CAROTID BILAT MOD SED  05/14/2020  . IR ANGIO VERTEBRAL SEL VERTEBRAL BILAT MOD SED  05/14/2020  . MELANOMA EXCISION  07/28/11   Dr Sarajane Jews; stomach and chest  . REMOVAL OF STONES  12/07/2018   Procedure: REMOVAL OF STONES;  Surgeon: Rush Landmark Telford Nab., MD;  Location: [facility];  Service: Gastroenterology;;  . [name]  1951  . 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 UPPER GASTROINTESTINAL ENDOSCOPY  1983   hiatal hernia  . VASECTOMY  1979    There were no vitals filed  for this visit.   Subjective Assessment - 05/27/20 1729    Subjective Pt arrives with wife today for ST.    Patient is accompained by: Family member   [name] - wife   Currently in Pain? No/denies                 ADULT SLP TREATMENT - 05/27/20 1659      General Information   Behavior/Cognition Alert;Cooperative;Pleasant mood      Cognitive-Linquistic Treatment   Treatment focused on Cognition;Patient/family/caregiver education    Skilled Treatment SLP completed CLQT today with pt. [place] scores were as follows: Attention 156 (Mild deficit), memory 141  (mild defiict), executive function 22 (WNL), Language 28 (WNL), Visuospatial 69 (WNL), and clock drawing 11 (WNL). Patient did not finish either maze due to time. After completion of the assessment, SLP explained pt's deficit areas. 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 SLP to assess pt using [place] and subtests from [place] Aphasia Battery next session.              SLP Short Term Goals - 05/28/20 0020      SLP SHORT TERM GOAL #1   Title Pt will demo selective attention in a mod noisy environment for 10 minutes with rare min A in 3 sessions    Time 4    Period Weeks    Status On-going      SLP SHORT TERM GOAL #2   Title pt will demonstrate intellectual awareness for 3 cognitive deficits by giving examples in 2 sessions    Time 4    Period Weeks    Status On-going      SLP SHORT TERM GOAL #3   Title pt will use memory compensations for more independent medication adminstration    Time 4    Period Weeks    Status On-going      SLP SHORT TERM GOAL #4   Title pt will complete cognitive lingiustic and aphasia evaluations PRN    Time 2    Period Weeks    Status On-going      SLP SHORT TERM GOAL #5   Title pt will engage in mod complex conversation for 15 minutes with compensations in 3 sessions    Time 4    Period Weeks    Status On-going            SLP Long Term Goals - 05/28/20 0020      SLP LONG TERM GOAL #1   Title pt will demonstrate divided attention between two simple cognitive linguistic tasks with 100% success each - with compensations (double checking, etc) in 3 sessions    Time 8    Period Weeks   or 17 sessions, for all LTGs   Status On-going      SLP LONG TERM GOAL #2   Title pt will demo anticipatory awareness by double checking cognitive-linguistic thearpy work 100% of the time    Time 8    Period Weeks    Status On-going      SLP LONG TERM GOAL #3    Title pt will use memory compensations succesfully for errands, med administration/refills, to-do lists, etc between 3 sessions    Time 8    Period Weeks    Status On-going      SLP LONG TERM GOAL #4   Title pt will engage in 15  minutes mod complex/complex conversation successfully using compensation strategies in 3 sessions    Time 8    Period Weeks    Status On-going            Plan - 05/28/20 0019    Clinical Impression Statement Kacper presents today with cognitive communication deficits in attention, memory, awareness and possibly processing. Pt and wife report anomia x3-4/day and rare dysnomia however none was observed today by SLP in mod complex conversation throughout the eval. Pt will receive aphasia assessment in next session. Pt will benefit from skilled ST addressing cognitive linguistics and aphasia in conversation.    Speech Therapy Frequency 2x / week    Duration 8 weeks   or 17 sessions   Treatment/Interventions Environmental controls;Language facilitation;Cueing hierarchy;Cognitive reorganization;Internal/external aids;Patient/family education;SLP instruction and feedback;Functional tasks    Potential to Achieve Goals Good    Consulted and Agree with Plan of Care Patient           Patient will benefit from skilled therapeutic intervention in order to improve the following deficits and impairments:   Cognitive communication deficit  Aphasia    Problem List Patient Active Problem List   Diagnosis Date Noted  . Dyslipidemia 05/23/2020  . Benign essential HTN 05/06/2020  . Subarachnoid hemorrhage ([place]) 04/30/2020  . Leukopenia 07/15/2018  . Thrombocytopenia ([place]) 07/15/2018  . PCP NOTES >>>>>>> 03/23/2015  . Annual physical exam 03/20/2015  . Bronchospasm 09/06/2014  . DJD (degenerative joint disease) 09/06/2014  . Hypothyroidism 08/28/2014  . Tremor 07/29/2014  . Gait disorder 07/29/2014  . Gout 01/08/2014  . Vitamin D deficiency 11/25/2012  .  Melanoma of skin, site unspecified 11/25/2012  . 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 Alcoholism in remission ([place]) 06/19/2011  . Diverticulitis of colon (without mention of hemorrhage)(562.11) 06/19/2011  . Bipolar I disorder ([place]) 06/19/2011  . Osteopenia 05/17/2010  . Alcohol abuse 05/16/2010  . HYPERTRIGLYCERIDEMIA 08/02/2009  . Macrocytic anemia 01/10/2009  . ABNORMAL ELECTROCARDIOGRAM 11/26/2008  . REFLUX, ESOPHAGEAL 11/17/2006  . COMMON MIGRAINE 09/22/2006    [facility] ,MS, CCC-SLP  05/28/2020, 12:21 AM  [facility] [address] [place], [name], [address] Phone: [phone]   Fax:  [phone]   Name: Jeffrey Acosta MRN: [id number] Date of Birth: February 11, 1951

## 2020-05-29 ENCOUNTER — Encounter: Payer: Self-pay | Admitting: Occupational Therapy

## 2020-05-29 ENCOUNTER — Ambulatory Visit: Payer: Medicare Other | Admitting: Occupational Therapy

## 2020-05-29 ENCOUNTER — Ambulatory Visit: Payer: Medicare Other

## 2020-05-29 ENCOUNTER — Ambulatory Visit: Payer: Medicare Other | Attending: Internal Medicine | Admitting: Physical Therapy

## 2020-05-29 ENCOUNTER — Other Ambulatory Visit: Payer: Self-pay

## 2020-05-29 ENCOUNTER — Encounter: Payer: Self-pay | Admitting: Physical Therapy

## 2020-05-29 DIAGNOSIS — R2681 Unsteadiness on feet: Secondary | ICD-10-CM

## 2020-05-29 DIAGNOSIS — M6281 Muscle weakness (generalized): Secondary | ICD-10-CM

## 2020-05-29 DIAGNOSIS — R2689 Other abnormalities of gait and mobility: Secondary | ICD-10-CM

## 2020-05-29 DIAGNOSIS — I69018 Other symptoms and signs involving cognitive functions following nontraumatic subarachnoid hemorrhage: Secondary | ICD-10-CM | POA: Insufficient documentation

## 2020-05-29 DIAGNOSIS — R41841 Cognitive communication deficit: Secondary | ICD-10-CM | POA: Diagnosis not present

## 2020-05-29 DIAGNOSIS — R4701 Aphasia: Secondary | ICD-10-CM | POA: Insufficient documentation

## 2020-05-29 NOTE — Therapy (Signed)
[facility] [address] [place], [name], [address] Phone: [phone]   Fax:  [phone]  Occupational Therapy Treatment  Patient Details  Name: Jeffrey Acosta MRN: [id number] Date of Birth: 08/16/1950 Referring Provider (OT): Dr. Florencia Reasons   Encounter Date: 05/29/2020   OT End of Session - 05/29/20 1216    Visit Number 3    Number of Visits 17    Date for OT Re-Evaluation 07/19/20    Authorization Type BCBS Medicare, copay per disc, no auth/VL (cognitive retraining not covered)    Authorization Time Period cert. date 05/21/19-08/18/20    Authorization - Visit Number 3    Authorization - Number of Visits 10    Progress Note Due on Visit 10   PN needed   OT Start Time 0935    OT Stop Time 1015    OT Time Calculation (min) 40 min    Activity Tolerance Patient tolerated treatment well    Behavior During Therapy Impulsive           Past Medical History:  Diagnosis Date  . Alcoholism in remission ([place])    in [place]  . Arrhythmia    atrial tachycardia  . Bipolar affective disorder ([place])   . Diverticulosis of colon (without mention of hemorrhage)   . GERD (gastroesophageal reflux disease)   . Gout of multiple sites   . Hiatal hernia   . Hx of migraines   . Hydrocele   . HYPERLIPIDEMIA 01/10/2009   Qualifier: Diagnosis of  By: Ronnald Ramp CMA, Chemira    NMR Lipoprofile 2011 LDL could not be calculated  Due to TG of 889  (811  / 750 ),  HDL 50 . Father MI @ 75 PGM CVA early 49s; PGF CVA @72    . Hypothyroidism   . Melanoma of skin, site unspecified 11/25/2012   07/2011 abdominal  Stage 1 Dr Amy [place] Dr Sarajane Jews   . Osteopenia   . Polyarthralgia     Past Surgical History:  Procedure Laterality Date  . BILIARY BRUSHING  12/07/2018   Procedure: BILIARY BRUSHING;  Surgeon: Rush Landmark Telford Nab., MD;  Location: [facility];  Service: Gastroenterology;;  . BILIARY DILATION  12/07/2018   Procedure: BILIARY DILATION;  Surgeon:  Irving Copas., MD;  Location: [facility];  Service: Gastroenterology;;  . BIOPSY  12/07/2018   Procedure: BIOPSY;  Surgeon: Irving Copas., MD;  [facility];  Service: Gastroenterology;;  . COLONOSCOPY  2003   negative ; Dr Sharlett Iles  . ERCP N/A 12/07/2018   Procedure: ENDOSCOPIC RETROGRADE CHOLANGIOPANCREATOGRAPHY (ERCP);  Surgeon: Irving Copas., MD;  Location: [facility];  Service: Gastroenterology;  Laterality: N/A;  . ESOPHAGOGASTRODUODENOSCOPY (EGD) WITH PROPOFOL N/A 12/07/2018   Procedure: ESOPHAGOGASTRODUODENOSCOPY (EGD) WITH PROPOFOL;  Surgeon: Rush Landmark Telford Nab., MD;  [facility];  Service: Gastroenterology;  Laterality: N/A;  . EUS N/A 12/07/2018   Procedure: UPPER ENDOSCOPIC ULTRASOUND (EUS) RADIAL;  Surgeon: Rush Landmark Telford Nab., MD;  [facility];  Service: Gastroenterology;  Laterality: N/A;  . INGUINAL HERNIA REPAIR  08/07/2011   Procedure: HERNIA REPAIR INGUINAL ADULT;  Surgeon: Rolm Bookbinder, MD;  Location: [facility];  Service: General;  Laterality: Right;  . IR ANGIO INTRA EXTRACRAN SEL INTERNAL CAROTID BILAT MOD SED  05/14/2020  . IR ANGIO VERTEBRAL SEL VERTEBRAL BILAT MOD SED  05/14/2020  . MELANOMA EXCISION  07/28/11   Dr Sarajane Jews; stomach and chest  . REMOVAL OF STONES  12/07/2018   Procedure:  REMOVAL OF STONES;  Surgeon: Mansouraty, Telford Nab., MD;  Location: [facility];  Service: Gastroenterology;;  . [name]  1951  . 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 UPPER GASTROINTESTINAL ENDOSCOPY  1983   hiatal hernia  . VASECTOMY  1979    There were no vitals filed for this visit.   Subjective Assessment - 05/29/20 1222    Subjective  Denies pain    Pertinent History subarachnoid hemorrhage (CT 04/30/20 showed moderate to large acute SAH; 05/11/20 MRI brain showed small acute infarcts in L basal ganglia, L periatrial temporal lobe and high bilateral frontoparietal  regions).  PMH:  hx of alcohol use with intoxication on hospital presentation, hypothyroidism, atrial tachycardia, Barrett's esophagus, hx of atrial tachycardia, osteopenia, thrombocytopenia, DJD, gout, melanoma of skin, bipolar disorder    Patient Stated Goals return to previous activities    Currently in Pain? No/denies                  Treatment: Organizing your day task with increased time and 2 omissions, min v.c to correct  Arm bike x 5 mins level 3 for conditioning               OT Education - 05/29/20 1218    Education Details Yellow theraband HEP review, min v.c    Person(s) Educated Patient;Spouse    Methods Explanation;Demonstration;Handout;Verbal cues    Comprehension Verbalized understanding;Returned demonstration            OT Short Term Goals - 05/20/20 0918      OT SHORT TERM GOAL #1   Title Pt will be independent with HEP for bilateral shoulder strength.--check STGs 06/20/20    Time 4    Period Weeks    Status New      OT SHORT TERM GOAL #2   Title Pt will be able to perform shower transfer with supervision.    Time 4    Period Weeks    Status New      OT SHORT TERM GOAL #3   Title Pt will be able to perform simple alternating attention with at least 90% accuracy for incr safety.    Time 4    Period Weeks    Status New      OT SHORT TERM GOAL #4   Title Pt will perform mod complex functional problem solving with at least 90% accuracy.    Time 4    Period Weeks    Status New      OT SHORT TERM GOAL #5   Title Pt will verbalize understanding of cognitive compensation strategies for ADLs/IADLs.    Time 4    Period Weeks    Status New             OT Long Term Goals - 05/20/20 0925      OT LONG TERM GOAL #1   Title Pt will able to reach at least 10" bilaterally on standing functional reach test without cueing/impulsivity for improved safety for IADLs.    Time 8    Period Weeks    Status New      OT LONG TERM GOAL #2    Title Pt will demo good safety awareness for retrieving items from low shelf/floor mod I/without cueing.    Time 8    Period Weeks    Status New      OT LONG TERM GOAL #3   Title Pt will perform simple prior cooking/home maintenance tasks  mod I and demonstrating good safety awareness/problem solving.    Time 8    Period Weeks    Status New      OT LONG TERM GOAL #4   Title Pt will perform simple divided attention with at least 1 physical and 1 cognitive tasks for improved safety.    Time 8    Period Weeks    Status New      OT LONG TERM GOAL #5   Title Pt will perform shower transfer mod I.    Time 8    Period Weeks    Status New                 Plan - 05/29/20 1215    Clinical Impression Statement Pt is progressing towards goals.  Pt continues to demo impulsivity with movement at times and was cued to slow down for safety with mobility.  Pt also continues to demo min incr time/cueing for cognitive tasks and he demonstrates decreased attention to detail.    OT Occupational Profile and History Detailed Assessment- Review of Records and additional review of physical, cognitive, psychosocial history related to current functional performance    Occupational performance deficits (Please refer to evaluation for details): ADL's;IADL's    Body Structure / Function / Physical Skills ADL;Strength;Balance;IADL;Endurance;Mobility;Coordination;Decreased knowledge of precautions    Cognitive Skills Problem Solve;Safety Awareness;Attention;Memory;Sequencing    Rehab Potential Good    Clinical Decision Making Several treatment options, min-mod task modification necessary    Comorbidities Affecting Occupational Performance: May have comorbidities impacting occupational performance    Modification or Assistance to Complete Evaluation  Min-Moderate modification of tasks or assist with assess necessary to complete eval    OT Frequency 2x / week    OT Duration 8 weeks   +eval or 17 total visits    OT Treatment/Interventions Self-care/ADL training;DME and/or AE instruction;Balance training;Therapeutic activities;Cognitive remediation/compensation;Therapeutic exercise;Neuromuscular education;Functional Mobility Training;Patient/family education;Manual Therapy;Passive range of motion;Energy conservation;Cryotherapy;Moist Heat    Plan continue to address functional cognition and safety with mobility due to impiulsivity    Consulted and Agree with Plan of Care Patient;Family member/caregiver    Family Member Consulted wife           Patient will benefit from skilled therapeutic intervention in order to improve the following deficits and impairments:   Body Structure / Function / Physical Skills: ADL,Strength,Balance,IADL,Endurance,Mobility,Coordination,Decreased knowledge of precautions Cognitive Skills: Problem Solve,Safety Awareness,Attention,Memory,Sequencing     Visit Diagnosis: Muscle weakness (generalized)  Other symptoms and signs involving cognitive functions following nontraumatic subarachnoid hemorrhage  Unsteadiness on feet  Other abnormalities of gait and mobility    Problem List Patient Active Problem List   Diagnosis Date Noted  . Dyslipidemia 05/23/2020  . Benign essential HTN 05/06/2020  . Subarachnoid hemorrhage ([place]) 04/30/2020  . Leukopenia 07/15/2018  . Thrombocytopenia ([place]) 07/15/2018  . PCP NOTES >>>>>>> 03/23/2015  . Annual physical exam 03/20/2015  . Bronchospasm 09/06/2014  . DJD (degenerative joint disease) 09/06/2014  . Hypothyroidism 08/28/2014  . Tremor 07/29/2014  . Gait disorder 07/29/2014  . Gout 01/08/2014  . Vitamin D deficiency 11/25/2012  . Melanoma of skin, site unspecified 11/25/2012  . 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 Alcoholism in remission ([place]) 06/19/2011  . Diverticulitis of colon (without mention of hemorrhage)(562.11) 06/19/2011  . Bipolar I disorder ([place]) 06/19/2011  . Osteopenia 05/17/2010  . Alcohol  abuse 05/16/2010  . HYPERTRIGLYCERIDEMIA 08/02/2009  . Macrocytic anemia 01/10/2009  . ABNORMAL ELECTROCARDIOGRAM 11/26/2008  . REFLUX, ESOPHAGEAL 11/17/2006  .  COMMON MIGRAINE 09/22/2006    RINE,KATHRYN 05/29/2020, 12:22 PM  [facility] [address] [place], [name], [address] Phone: [phone]   Fax:  [phone]  Name: Jeffrey Acosta MRN: [id number] Date of Birth: [birth year]

## 2020-05-29 NOTE — Patient Instructions (Addendum)
Access Code: [email or web address] URL: [email or web address] Date: 05/29/2020 Prepared by: Willow Ora  Exercises Walking March - 1-2 x daily - 5 x weekly - 2 sets - 4 reps - 2 hold Standing Tandem Balance with Counter Support - 1-2 x daily - 5 x weekly - 2 sets - 4 reps - 30 hold Standing Single Leg Stance with Counter Support - 1-2 x daily - 5 x weekly - 1-2 sets - 4 reps - 15 hold Heel Walking with Counter Support - 1 x daily - 5 x weekly - 1 sets - 4 reps  Added the following to HEP this session: Toe Walking with Counter Support - 1 x daily - 5 x weekly - 1 sets - 4 reps [place]berg Stance Eyes Closed on Foam Pad - 1 x daily - 5 x weekly - 1 sets - 3 reps - 30 hold Wide Stance with Head Rotation on Foam Pad - 1 x daily - 5 x weekly - 1 sets - 10 reps

## 2020-05-30 NOTE — Therapy (Signed)
[facility] [address] [place], [name], [address] Phone: [phone]   Fax:  [phone]  Physical Therapy Treatment  Patient Details  Name: Jeffrey Acosta MRN: [id number] Date of Birth: 05-11-1950 Referring Provider (PT): Erlinda Hong, Fang/Dr. Kathlene November, PCP   Encounter Date: 05/29/2020     05/29/20 0849  PT Visits / Re-Eval  Visit Number 3  Number of Visits 18  Date for PT Re-Evaluation 08/18/20  Authorization  Authorization Type BCBS Medicare-$40 copay per discipline  PT Time Calculation  PT Start Time 0847  PT Stop Time 0930  PT Time Calculation (min) 43 min  PT - End of Session  Behavior During Therapy Impulsive    Past Medical History:  Diagnosis Date  . Alcoholism in remission ([place])    in [place]  . Arrhythmia    atrial tachycardia  . Bipolar affective disorder ([place])   . Diverticulosis of colon (without mention of hemorrhage)   . GERD (gastroesophageal reflux disease)   . Gout of multiple sites   . Hiatal hernia   . Hx of migraines   . Hydrocele   . HYPERLIPIDEMIA 01/10/2009   Qualifier: Diagnosis of  By: Ronnald Ramp CMA, Chemira    NMR Lipoprofile 2011 LDL could not be calculated  Due to TG of 889  (811  / 750 ),  HDL 50 . Father MI @ 40 PGM CVA early 108s; PGF CVA @72    . Hypothyroidism   . Melanoma of skin, site unspecified 11/25/2012   07/2011 abdominal  Stage 1 Dr Amy Martinique Dr Sarajane Jews   . Osteopenia   . Polyarthralgia     Past Surgical History:  Procedure Laterality Date  . BILIARY BRUSHING  12/07/2018   Procedure: BILIARY BRUSHING;  Surgeon: Rush Landmark Telford Nab., MD;  Location: [facility];  Service: Gastroenterology;;  . BILIARY DILATION  12/07/2018   Procedure: BILIARY DILATION;  Surgeon: Irving Copas., MD;  Location: [facility];  Service: Gastroenterology;;  . BIOPSY  12/07/2018   Procedure: BIOPSY;  Surgeon: Irving Copas., MD;  Location: [facility];  Service: Gastroenterology;;   . COLONOSCOPY  2003   negative ; Dr Sharlett Iles  . ERCP N/A 12/07/2018   Procedure: ENDOSCOPIC RETROGRADE CHOLANGIOPANCREATOGRAPHY (ERCP);  Surgeon: Irving Copas., MD;  Location: [facility];  Service: Gastroenterology;  Laterality: N/A;  . ESOPHAGOGASTRODUODENOSCOPY (EGD) WITH PROPOFOL N/A 12/07/2018   Procedure: ESOPHAGOGASTRODUODENOSCOPY (EGD) WITH PROPOFOL;  Surgeon: Rush Landmark Telford Nab., MD;  Location: [facility];  Service: Gastroenterology;  Laterality: N/A;  . EUS N/A 12/07/2018   Procedure: UPPER ENDOSCOPIC ULTRASOUND (EUS) RADIAL;  Surgeon: Rush Landmark Telford Nab., MD;  Location: [facility];  Service: Gastroenterology;  Laterality: N/A;  . INGUINAL HERNIA REPAIR  08/07/2011   Procedure: HERNIA REPAIR INGUINAL ADULT;  Surgeon: Rolm Bookbinder, MD;  Location: [facility];  Service: General;  Laterality: Right;  . IR ANGIO INTRA EXTRACRAN SEL INTERNAL CAROTID BILAT MOD SED  05/14/2020  . IR ANGIO VERTEBRAL SEL VERTEBRAL BILAT MOD SED  05/14/2020  . MELANOMA EXCISION  07/28/11   Dr Sarajane Jews; stomach and chest  . REMOVAL OF STONES  12/07/2018   Procedure: REMOVAL OF STONES;  Surgeon: Rush Landmark Telford Nab., MD;  Location: [facility];  Service: Gastroenterology;;  . [name]  1951  . 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 UPPER GASTROINTESTINAL ENDOSCOPY  1983   hiatal hernia  . VASECTOMY  1979    There were no vitals filed for this visit.  05/29/20 0848  Symptoms/Limitations  Subjective No new complaints. No falls or pain to report. HEP is going well.  Patient is accompained by: Family member (spouse, [name])  Patient Stated Goals Pt's goals for therapy are to be back to being independent and to get rid of walker.  Pain Assessment  Currently in Pain? No/denies  Pain Score 0      05/29/20 0850  Transfers  Transfers Sit to Stand;Stand to Sit  Sit to Stand 5: Supervision;Without upper extremity assist;From chair/3-in-1  Stand to  Sit 5: Supervision;Without upper extremity assist;To chair/3-in-1  Ambulation/Gait  Ambulation/Gait Yes  Ambulation/Gait Assistance 4: Min guard  Ambulation/Gait Assistance Details use of pt's straight cane with session. worked on speed changes, direction changes, sudden stops/starts, and scanning all direction. left foot scuffing as pt fatigued. Min guard assist for safety.  Ambulation Distance (Feet) 350 Feet (x1, around gym with session)  Assistive device Straight cane  Gait Pattern Step-through pattern;Narrow base of support  Ambulation Surface Level;Indoor  High Level Balance  High Level Balance Activities Other (comment) (toe walking forward)  High Level Balance Comments at counter: 4 laps with single UE support. cues on form/technique. min guard assist for safety.      05/29/20 0857  Balance Exercises: Standing  Standing Eyes Closed Narrow base of support (BOS);Wide (BOA);Head turns;Foam/compliant surface;Other reps (comment);30 secs;Limitations  Standing Eyes Closed Limitations on pillows in corner with chair in front: with feet together EC 30 sec's x 3 reps, progressing to feet hip width apart for EC head movements left<>right, up<>down for ~10 reps each.  Balance Beam standing across blue foam beam: alternating forward stepping to the floor/back onto the beam, then alterantign backward stepping to the floor/back onto the beam. ~10 reps each with pt touching the bars as needed for balance at times. min guard to min assist with cues for increased step length/step height to clear the beam surface.  Tandem Gait Forward;Retro;Foam/compliant surface;4 reps;Upper extremity support;Limitations (light UE support on bars)  Tandem Gait Limitations on blue foam beam with light UE support on bars. min guard assist for safety with cues for step placement on the beam and posture.  Sidestepping Foam/compliant support;4 reps;Limitations  Sidestepping Limitations on blue foam beam with light UE  support with cues to lift foot up, not slide it along. min guard assist for safety.         Issued the following to HEP:   Access Code: [email or web address] URL: [email or web address] Date: 05/29/2020 Prepared by: Willow Ora  Exercises Walking March - 1-2 x daily - 5 x weekly - 2 sets - 4 reps - 2 hold Standing Tandem Balance with Counter Support - 1-2 x daily - 5 x weekly - 2 sets - 4 reps - 30 hold Standing Single Leg Stance with Counter Support - 1-2 x daily - 5 x weekly - 1-2 sets - 4 reps - 15 hold Heel Walking with Counter Support - 1 x daily - 5 x weekly - 1 sets - 4 reps  Added the following to HEP this session: Toe Walking with Counter Support - 1 x daily - 5 x weekly - 1 sets - 4 reps Romberg Stance Eyes Closed on Foam Pad - 1 x daily - 5 x weekly - 1 sets - 3 reps - 30 hold Wide Stance with Head Rotation on Foam Pad - 1 x daily - 5 x weekly - 1 sets - 10 reps       PT Education - 05/30/20 1744  Education Details additions to HEP    Person(s) Educated Patient;Spouse    Methods Explanation;Demonstration;Verbal cues;Handout    Comprehension Verbalized understanding;Returned demonstration;Verbal cues required;Need further instruction            PT Short Term Goals - 05/20/20 1543      PT SHORT TERM GOAL #1   Title Pt will be independent with HEP for improved strength, balance, transfers, and gait.  06/21/2020    Time 5    Period Weeks    Status New      PT SHORT TERM GOAL #2   Title Pt will improve 5x sit<>stand to less than or equal to 15 sec to demonstrate improved functional strength and transfer efficiency.    Baseline 18.25 sec    Time 5    Period Weeks    Status New      PT SHORT TERM GOAL #3   Title Pt will improve TUG score to less than or equal to 13.5 sec for decreased fall risk.    Baseline 16.72 sec with RW    Time 5    Period Weeks    Status New      PT SHORT TERM GOAL #4   Title Pt will improve Berg score to at least 47/56 for  decreased fall risk.    Baseline 40/56    Time 5    Period Weeks    Status New      PT SHORT TERM GOAL #5   Title Pt will ambulate short distances, 50-100 ft with SPC with supervision, for improved transition to more independent household gait.    Time 5    Period Weeks    Status New             PT Long Term Goals - 05/20/20 1546      PT LONG TERM GOAL #1   Title Pt will be independent with progression of HEP for improved strength, balance, transfers, and gait.  TARGET 07/19/2020    Time 9    Period Weeks    Status New      PT LONG TERM GOAL #2   Title Pt will improve 5x sit<>stand to less than or equal to 12.5 sec to demonstrate improved functional strength and transfer efficiency.    Time 9    Period Weeks    Status New      PT LONG TERM GOAL #3   Title Pt will improve gait velocity to at least 2.62 ft/sec for improved gait efficiency and safety.    Time 9    Period Weeks    Status New      PT LONG TERM GOAL #4   Title Pt will be mod independent with outdoor gait, at least 1000 ft, using cane versus no device, for improved community gait.    Time 9    Period Weeks    Status New      PT LONG TERM GOAL #5   Title Pt will verbalize understanding of fall prevention in home environment.    Time 9    Period Weeks    Status New             05/29/20 0850  Plan  Clinical Impression Statement Today's skilled session initially focused on additons to HEP for balance/ankle strengthening. Remainder of sesison continued to focus on balance training with no issues noted or reported. Pt does need cues for safety at time with mobility due to impulsivity. The pt is progressing  toward goals and should benefit from contined PT to progress toward unmet goals.  Personal Factors and Comorbidities Comorbidity 3+  Comorbidities See notes  Examination-Activity Limitations Locomotion Level;Transfers;Stand  Examination-Participation Restrictions Shop;Community Activity  Pt will  benefit from skilled therapeutic intervention in order to improve on the following deficits Abnormal gait;Difficulty walking;Decreased safety awareness;Decreased balance;Decreased mobility;Decreased strength  Stability/Clinical Decision Making Evolving/Moderate complexity  Rehab Potential Good  PT Frequency 2x / week  PT Duration Other (comment) (for 8 weeks, plus 1 additional visit week of eval)  PT Treatment/Interventions ADLs/Self Care Home Management;DME Instruction;Neuromuscular re-education;Balance training;Therapeutic exercise;Therapeutic activities;Functional mobility training;Stair training;Gait training;Patient/family education  PT Next Visit Plan continue with functional strengtheing, balance; gait training with RW>progression to cane as appropriate.  PT Home Exercise Plan Access Code: [email or web address]  Consulted and Agree with Plan of Care Patient;Family member/caregiver  Family Member Consulted wife-[name]          Patient will benefit from skilled therapeutic intervention in order to improve the following deficits and impairments:  Abnormal gait,Difficulty walking,Decreased safety awareness,Decreased balance,Decreased mobility,Decreased strength  Visit Diagnosis: Muscle weakness (generalized)  Unsteadiness on feet  Other abnormalities of gait and mobility     Problem List Patient Active Problem List   Diagnosis Date Noted  . Dyslipidemia 05/23/2020  . Benign essential HTN 05/06/2020  . Subarachnoid hemorrhage ([place]) 04/30/2020  . Leukopenia 07/15/2018  . Thrombocytopenia ([place]) 07/15/2018  . PCP NOTES >>>>>>> 03/23/2015  . Annual physical exam 03/20/2015  . Bronchospasm 09/06/2014  . DJD (degenerative joint disease) 09/06/2014  . Hypothyroidism 08/28/2014  . Tremor 07/29/2014  . Gait disorder 07/29/2014  . Gout 01/08/2014  . Vitamin D deficiency 11/25/2012  . Melanoma of skin, site unspecified 11/25/2012  . 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 Alcoholism  in remission ([place]) 06/19/2011  . Diverticulitis of colon (without mention of hemorrhage)(562.11) 06/19/2011  . Bipolar I disorder ([place]) 06/19/2011  . Osteopenia 05/17/2010  . Alcohol abuse 05/16/2010  . HYPERTRIGLYCERIDEMIA 08/02/2009  . Macrocytic anemia 01/10/2009  . ABNORMAL ELECTROCARDIOGRAM 11/26/2008  . REFLUX, ESOPHAGEAL 11/17/2006  . COMMON MIGRAINE 09/22/2006    Willow Ora, PTA, [facility] [address], [place],  [address] [phone] 05/30/20, 5:44 PM   Name: Jeffrey Acosta MRN: [id number] Date of Birth: [birth year]

## 2020-05-31 DIAGNOSIS — Z6825 Body mass index (BMI) 25.0-25.9, adult: Secondary | ICD-10-CM | POA: Diagnosis not present

## 2020-05-31 DIAGNOSIS — I609 Nontraumatic subarachnoid hemorrhage, unspecified: Secondary | ICD-10-CM | POA: Diagnosis not present

## 2020-06-04 ENCOUNTER — Encounter: Payer: Self-pay | Admitting: Internal Medicine

## 2020-06-05 ENCOUNTER — Other Ambulatory Visit: Payer: Self-pay

## 2020-06-05 ENCOUNTER — Ambulatory Visit: Payer: Medicare Other | Admitting: Occupational Therapy

## 2020-06-05 ENCOUNTER — Ambulatory Visit: Payer: Medicare Other

## 2020-06-05 DIAGNOSIS — R41841 Cognitive communication deficit: Secondary | ICD-10-CM

## 2020-06-05 DIAGNOSIS — M6281 Muscle weakness (generalized): Secondary | ICD-10-CM

## 2020-06-05 DIAGNOSIS — R2681 Unsteadiness on feet: Secondary | ICD-10-CM

## 2020-06-05 DIAGNOSIS — I69018 Other symptoms and signs involving cognitive functions following nontraumatic subarachnoid hemorrhage: Secondary | ICD-10-CM

## 2020-06-05 DIAGNOSIS — R2689 Other abnormalities of gait and mobility: Secondary | ICD-10-CM

## 2020-06-05 DIAGNOSIS — R4701 Aphasia: Secondary | ICD-10-CM

## 2020-06-05 NOTE — Therapy (Signed)
[facility] [address] [place], [name], [address] Phone: [phone]   Fax:  [phone]  Occupational Therapy Treatment  Patient Details  Name: Jeffrey Acosta MRN: [id number] Date of Birth: [birth year] Referring Provider (OT): Dr. Florencia Reasons   Encounter Date: 06/05/2020   OT End of Session - 06/05/20 1109    Visit Number 4    Number of Visits 17    Date for OT Re-Evaluation 07/19/20    Authorization Type BCBS Medicare, copay per disc, no auth/VL (cognitive retraining not covered)    Authorization Time Period cert. date 05/21/19-08/18/20    Authorization - Visit Number 4    Authorization - Number of Visits 10    OT Start Time 1020    OT Stop Time 1100    OT Time Calculation (min) 40 min    Activity Tolerance Patient tolerated treatment well    Behavior During Therapy Impulsive           Past Medical History:  Diagnosis Date  . Alcoholism in remission ([place])    in [place]  . Arrhythmia    atrial tachycardia  . Bipolar affective disorder ([place])   . Diverticulosis of colon (without mention of hemorrhage)   . GERD (gastroesophageal reflux disease)   . Gout of multiple sites   . Hiatal hernia   . Hx of migraines   . Hydrocele   . HYPERLIPIDEMIA 01/10/2009   Qualifier: Diagnosis of  By: Ronnald Ramp CMA, Chemira    NMR Lipoprofile 2011 LDL could not be calculated  Due to TG of 889  (811  / 750 ),  HDL 50 . Father MI @ 55 PGM CVA early 56s; PGF CVA @72    . Hypothyroidism   . Melanoma of skin, site unspecified 11/25/2012   07/2011 abdominal  Stage 1 Dr Amy Martinique Dr Sarajane Jews   . Osteopenia   . Polyarthralgia     Past Surgical History:  Procedure Laterality Date  . BILIARY BRUSHING  12/07/2018   Procedure: BILIARY BRUSHING;  Surgeon: Rush Landmark Telford Nab., MD;  Location: [facility];  Service: Gastroenterology;;  . BILIARY DILATION  12/07/2018   Procedure: BILIARY DILATION;  Surgeon: Irving Copas., MD;  Location: [facility];  Service: Gastroenterology;;  . BIOPSY  12/07/2018   Procedure: BIOPSY;  Surgeon: Irving Copas., MD;  Location: [facility];  Service: Gastroenterology;;  . COLONOSCOPY  2003   negative ; Dr Sharlett Iles  . ERCP N/A 12/07/2018   Procedure: ENDOSCOPIC RETROGRADE CHOLANGIOPANCREATOGRAPHY (ERCP);  Surgeon: Irving Copas., MD;  Location: [facility];  Service: Gastroenterology;  Laterality: N/A;  . ESOPHAGOGASTRODUODENOSCOPY (EGD) WITH PROPOFOL N/A 12/07/2018   Procedure: ESOPHAGOGASTRODUODENOSCOPY (EGD) WITH PROPOFOL;  Surgeon: Rush Landmark Telford Nab., MD;  Location: [facility];  Service: Gastroenterology;  Laterality: N/A;  . EUS N/A 12/07/2018   Procedure: UPPER ENDOSCOPIC ULTRASOUND (EUS) RADIAL;  Surgeon: Rush Landmark Telford Nab., MD;  Location: [facility];  Service: Gastroenterology;  Laterality: N/A;  . INGUINAL HERNIA REPAIR  08/07/2011   Procedure: HERNIA REPAIR INGUINAL ADULT;  Surgeon: Rolm Bookbinder, MD;  Location: [facility];  Service: General;  Laterality: Right;  . IR ANGIO INTRA EXTRACRAN SEL INTERNAL CAROTID BILAT MOD SED  05/14/2020  . IR ANGIO VERTEBRAL SEL VERTEBRAL BILAT MOD SED  05/14/2020  . MELANOMA EXCISION  07/28/11   Dr Sarajane Jews; stomach and chest  . REMOVAL OF STONES  12/07/2018   Procedure: REMOVAL OF STONES;  Surgeon: Rush Landmark Telford Nab., MD;  Location: [facility];  Service: Gastroenterology;;  . TONSILLECTOMY  1951  . 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 UPPER GASTROINTESTINAL ENDOSCOPY  1983   hiatal hernia  . VASECTOMY  1979    There were no vitals filed for this visit.   Subjective Assessment - 06/05/20 1042    Subjective  Denies pain    Pertinent History subarachnoid hemorrhage (CT 04/30/20 showed moderate to large acute SAH; 05/11/20 MRI brain showed small acute infarcts in L basal ganglia, L periatrial temporal lobe and high bilateral frontoparietal regions).  PMH:  hx of alcohol use with  intoxication on hospital presentation, hypothyroidism, atrial tachycardia, Barrett's esophagus, hx of atrial tachycardia, osteopenia, thrombocytopenia, DJD, gout, melanoma of skin, bipolar disorder    Patient Stated Goals return to previous activities    Currently in Pain? No/denies                    Treatment: Map reading activity on constant therapy level 1, min-mod difficulty, pt missed several dure to decreased attention to detail, min v.c Pipe tree design x 2, for organization and problem solving, mod v.c for first design with multiple errors, only 1 verbal cue for second design. Arm bike for conditioning/ reciprocal movement x 5 mins level 4             OT Short Term Goals - 05/20/20 4097      OT SHORT TERM GOAL #1   Title Pt will be independent with HEP for bilateral shoulder strength.--check STGs 06/20/20    Time 4    Period Weeks    Status New      OT SHORT TERM GOAL #2   Title Pt will be able to perform shower transfer with supervision.    Time 4    Period Weeks    Status New      OT SHORT TERM GOAL #3   Title Pt will be able to perform simple alternating attention with at least 90% accuracy for incr safety.    Time 4    Period Weeks    Status New      OT SHORT TERM GOAL #4   Title Pt will perform mod complex functional problem solving with at least 90% accuracy.    Time 4    Period Weeks    Status New      OT SHORT TERM GOAL #5   Title Pt will verbalize understanding of cognitive compensation strategies for ADLs/IADLs.    Time 4    Period Weeks    Status New             OT Long Term Goals - 05/20/20 0925      OT LONG TERM GOAL #1   Title Pt will able to reach at least 10" bilaterally on standing functional reach test without cueing/impulsivity for improved safety for IADLs.    Time 8    Period Weeks    Status New      OT LONG TERM GOAL #2   Title Pt will demo good safety awareness for retrieving items from low shelf/floor mod  I/without cueing.    Time 8    Period Weeks    Status New      OT LONG TERM GOAL #3   Title Pt will perform simple prior cooking/home maintenance tasks mod I and demonstrating good safety awareness/problem solving.    Time 8    Period Weeks    Status New  OT LONG TERM GOAL #4   Title Pt will perform simple divided attention with at least 1 physical and 1 cognitive tasks for improved safety.    Time 8    Period Weeks    Status New      OT LONG TERM GOAL #5   Title Pt will perform shower transfer mod I.    Time 8    Period Weeks    Status New                 Plan - 06/05/20 1109    Clinical Impression Statement Pt is progressing towards goals.  Pt continues to demo impulsivity with movement at times. Pt nearly tripped over arm bike as he was moving very quickly, requiring theapist assist to recover. Pt continues to dmeonstrate difficulty with attention to detail    OT Occupational Profile and History Detailed Assessment- Review of Records and additional review of physical, cognitive, psychosocial history related to current functional performance    Occupational performance deficits (Please refer to evaluation for details): ADL's;IADL's    Body Structure / Function / Physical Skills ADL;Strength;Balance;IADL;Endurance;Mobility;Coordination;Decreased knowledge of precautions    Cognitive Skills Problem Solve;Safety Awareness;Attention;Memory;Sequencing    Rehab Potential Good    Clinical Decision Making Several treatment options, min-mod task modification necessary    Comorbidities Affecting Occupational Performance: May have comorbidities impacting occupational performance    Modification or Assistance to Complete Evaluation  Min-Moderate modification of tasks or assist with assess necessary to complete eval    OT Frequency 2x / week    OT Duration 8 weeks   +eval or 17 total visits   OT Treatment/Interventions Self-care/ADL training;DME and/or AE instruction;Balance  training;Therapeutic activities;Cognitive remediation/compensation;Therapeutic exercise;Neuromuscular education;Functional Mobility Training;Patient/family education;Manual Therapy;Passive range of motion;Energy conservation;Cryotherapy;Moist Heat    Plan continue to address functional cognition and safety with mobility due to impiulsivity    Consulted and Agree with Plan of Care Patient;Family member/caregiver    Family Member Consulted wife           Patient will benefit from skilled therapeutic intervention in order to improve the following deficits and impairments:   Body Structure / Function / Physical Skills: ADL,Strength,Balance,IADL,Endurance,Mobility,Coordination,Decreased knowledge of precautions Cognitive Skills: Problem Solve,Safety Awareness,Attention,Memory,Sequencing     Visit Diagnosis: Muscle weakness (generalized)  Unsteadiness on feet  Other abnormalities of gait and mobility  Other symptoms and signs involving cognitive functions following nontraumatic subarachnoid hemorrhage    Problem List Patient Active Problem List   Diagnosis Date Noted  . Dyslipidemia 05/23/2020  . Benign essential HTN 05/06/2020  . Subarachnoid hemorrhage ([place]) 04/30/2020  . Leukopenia 07/15/2018  . Thrombocytopenia ([place]) 07/15/2018  . PCP NOTES >>>>>>> 03/23/2015  . Annual physical exam 03/20/2015  . Bronchospasm 09/06/2014  . DJD (degenerative joint disease) 09/06/2014  . Hypothyroidism 08/28/2014  . Tremor 07/29/2014  . Gait disorder 07/29/2014  . Gout 01/08/2014  . Vitamin D deficiency 11/25/2012  . Melanoma of skin, site unspecified 11/25/2012  . 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 Alcoholism in remission ([place]) 06/19/2011  . Diverticulitis of colon (without mention of hemorrhage)(562.11) 06/19/2011  . Bipolar I disorder ([place]) 06/19/2011  . Osteopenia 05/17/2010  . Alcohol abuse 05/16/2010  . HYPERTRIGLYCERIDEMIA 08/02/2009  . Macrocytic anemia 01/10/2009   . ABNORMAL ELECTROCARDIOGRAM 11/26/2008  . REFLUX, ESOPHAGEAL 11/17/2006  . COMMON MIGRAINE 09/22/2006    Jeffrey Acosta 06/05/2020, 11:20 AM  [facility] [address] [place], [name], [address] Phone: [phone]   Fax:  [place]  Name: Jeffrey Acosta MRN: [id number] Date of Birth: Feb 07, 1951

## 2020-06-05 NOTE — Patient Instructions (Signed)
Use the semantic feature analysis sheet with any noun you cannot think of  - conversation or otherwise.

## 2020-06-05 NOTE — Therapy (Addendum)
[facility] [address] [place], [name], [address] Phone: [phone]   Fax:  [phone]  Physical Therapy Treatment  Patient Details  Name: Jeffrey Acosta MRN: [id number] Date of Birth: May 17, 1950 Referring Provider (PT): Erlinda Hong, Fang/Dr. Kathlene November, PCP   Encounter Date: 06/05/2020   PT End of Session - 06/05/20 0800    Visit Number 4    Number of Visits 18    Date for PT Re-Evaluation 08/18/20    Authorization Type BCBS Medicare-$40 copay per discipline    PT Start Time 0800    PT Stop Time 0850    PT Time Calculation (min) 50 min    Equipment Utilized During Treatment Gait belt    Activity Tolerance Patient tolerated treatment well    Behavior During Therapy Impulsive           Past Medical History:  Diagnosis Date  . Alcoholism in remission ([place])    in [place]  . Arrhythmia    atrial tachycardia  . Bipolar affective disorder ([place])   . Diverticulosis of colon (without mention of hemorrhage)   . GERD (gastroesophageal reflux disease)   . Gout of multiple sites   . Hiatal hernia   . Hx of migraines   . Hydrocele   . HYPERLIPIDEMIA 01/10/2009   Qualifier: Diagnosis of  By: Ronnald Ramp CMA, Chemira    NMR Lipoprofile 2011 LDL could not be calculated  Due to TG of 889  (811  / 750 ),  HDL 50 . Father MI @ 66 PGM CVA early 76s; PGF CVA @72    . Hypothyroidism   . Melanoma of skin, site unspecified 11/25/2012   07/2011 abdominal  Stage 1 Dr Amy Martinique Dr Sarajane Jews   . Osteopenia   . Polyarthralgia     Past Surgical History:  Procedure Laterality Date  . BILIARY BRUSHING  12/07/2018   Procedure: BILIARY BRUSHING;  Surgeon: Rush Landmark Telford Nab., MD;  Location: [facility];  Service: Gastroenterology;;  . BILIARY DILATION  12/07/2018   Procedure: BILIARY DILATION;  Surgeon: Irving Copas., MD;  Location: [facility];  Service: Gastroenterology;;  . BIOPSY  12/07/2018   Procedure: BIOPSY;  Surgeon: Irving Copas., MD;  Location: [facility];  Service: Gastroenterology;;  . COLONOSCOPY  2003   negative ; Dr Sharlett Iles  . ERCP N/A 12/07/2018   Procedure: ENDOSCOPIC RETROGRADE CHOLANGIOPANCREATOGRAPHY (ERCP);  Surgeon: Irving Copas., MD;  Location: [facility];  Service: Gastroenterology;  Laterality: N/A;  . ESOPHAGOGASTRODUODENOSCOPY (EGD) WITH PROPOFOL N/A 12/07/2018   Procedure: ESOPHAGOGASTRODUODENOSCOPY (EGD) WITH PROPOFOL;  Surgeon: Rush Landmark Telford Nab., MD;  [facility]: [facility];  Service: Gastroenterology;  Laterality: N/A;  . EUS N/A 12/07/2018   Procedure: UPPER ENDOSCOPIC ULTRASOUND (EUS) RADIAL;  Surgeon: Rush Landmark Telford Nab., MD;  Location: [facility];  Service: Gastroenterology;  Laterality: N/A;  . INGUINAL HERNIA REPAIR  08/07/2011   Procedure: HERNIA REPAIR INGUINAL ADULT;  Surgeon: Rolm Bookbinder, MD;  Location: [facility];  Service: General;  Laterality: Right;  . IR ANGIO INTRA EXTRACRAN SEL INTERNAL CAROTID BILAT MOD SED  05/14/2020  . IR ANGIO VERTEBRAL SEL VERTEBRAL BILAT MOD SED  05/14/2020  . MELANOMA EXCISION  07/28/11   Dr Sarajane Jews; stomach and chest  . REMOVAL OF STONES  12/07/2018   Procedure: REMOVAL OF STONES;  Surgeon: Rush Landmark Telford Nab., MD;  Location: [facility];  Service: Gastroenterology;;  . [name]  1951  . 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  UPPER GASTROINTESTINAL ENDOSCOPY  1983   hiatal hernia  . VASECTOMY  1979    There were no vitals filed for this visit.     Treatment: Gait training with st cane in R hand: 1 x 414', CGA only, worked on horizontal head turns, vertical head turns, fast and normal walking speeds. Sidelying manually resisted hip flexion R only: worked on fast concentric contraction and slow eccentric control 2 x 5- to improve neuromuscular fascilitation of R hip flexion during the gait.  Single knee to chest: manually 3 x 20" R only; 5 x 20" by patient Fwd step up: 8"  box 10x R and L, no HHA, CGA, cues for more controlled eccentric descent  BP: 93/61, HR 86bpm Trial 1 BP 89/61, HR 83 bpm Trial 2 Pt put in supine BP measured after 2 min 103/61 BP measured after 4 min 98/63.  Pt asked to stand up from supine: BP measured immediately 64/41-pt educated that this is why it is important for him to wait 30 sec to 1 min when he changes position before he moves.  BP after standing for 2-3 min: 74/51  Access Code: [email or web address] URL: [email or web address] Date: 06/05/2020 Prepared by: Markus Jarvis  Exercises Hooklying Single Knee to Chest - 2 x daily - 7 x weekly - 5 reps - 20 hold       Access Code: [email or web address] URL: [email or web address] Date: 05/29/2020 Prepared by: Willow Ora   Exercises Walking March - 1-2 x daily - 5 x weekly - 2 sets - 4 reps - 2 hold Standing Tandem Balance with Counter Support - 1-2 x daily - 5 x weekly - 2 sets - 4 reps - 30 hold Standing Single Leg Stance with Counter Support - 1-2 x daily - 5 x weekly - 1-2 sets - 4 reps - 15 hold Heel Walking with Counter Support - 1 x daily - 5 x weekly - 1 sets - 4 reps   Added the following to HEP this session: Toe Walking with Counter Support - 1 x daily - 5 x weekly - 1 sets - 4 reps Romberg Stance Eyes Closed on Foam Pad - 1 x daily - 5 x weekly - 1 sets - 3 reps - 30 hold Wide Stance with Head Rotation on Foam Pad - 1 x daily - 5 x weekly - 1 sets - 10 reps                      PT Education - 06/05/20 0840    Education Details Pt educated to document and track BP data daily. Taking BP before he takes medication. If it is running consistently low, talk to MD for medication management.    Person(s) Educated Patient;Spouse    Methods Explanation    Comprehension Verbalized understanding            PT Short Term Goals - 05/20/20 1543      PT SHORT TERM GOAL #1   Title Pt will be independent with HEP for improved strength, balance,  transfers, and gait.  06/21/2020    Time 5    Period Weeks    Status New      PT SHORT TERM GOAL #2   Title Pt will improve 5x sit<>stand to less than or equal to 15 sec to demonstrate improved functional strength and transfer efficiency.    Baseline 18.25 sec    Time 5    Period Weeks    Status New  PT SHORT TERM GOAL #3   Title Pt will improve TUG score to less than or equal to 13.5 sec for decreased fall risk.    Baseline 16.72 sec with RW    Time 5    Period Weeks    Status New      PT SHORT TERM GOAL #4   Title Pt will improve Berg score to at least 47/56 for decreased fall risk.    Baseline 40/56    Time 5    Period Weeks    Status New      PT SHORT TERM GOAL #5   Title Pt will ambulate short distances, 50-100 ft with SPC with supervision, for improved transition to more independent household gait.    Time 5    Period Weeks    Status New             PT Long Term Goals - 05/20/20 1546      PT LONG TERM GOAL #1   Title Pt will be independent with progression of HEP for improved strength, balance, transfers, and gait.  TARGET 07/19/2020    Time 9    Period Weeks    Status New      PT LONG TERM GOAL #2   Title Pt will improve 5x sit<>stand to less than or equal to 12.5 sec to demonstrate improved functional strength and transfer efficiency.    Time 9    Period Weeks    Status New      PT LONG TERM GOAL #3   Title Pt will improve gait velocity to at least 2.62 ft/sec for improved gait efficiency and safety.    Time 9    Period Weeks    Status New      PT LONG TERM GOAL #4   Title Pt will be mod independent with outdoor gait, at least 1000 ft, using cane versus no device, for improved community gait.    Time 9    Period Weeks    Status New      PT LONG TERM GOAL #5   Title Pt will verbalize understanding of fall prevention in home environment.    Time 9    Period Weeks    Status New                 Plan - 06/05/20 0800    Clinical  Impression Statement Pt demonstrates decreased R hip flexion  and decreased R ankle dorsiflexion during gait and decreased L-R step length as a result. MMT resulted in R hip flexion being weaker compared to L which may be causing antalgic gait. Improved hip flexion on R noted during R swing phase with improved stride length but no significant difference in R ankle dorsiflexion noted. Pt felt light headed during step up exercises. Two trials of BP were demo low BP. Patient was laid down in supine and BP came up to 103/61.    Personal Factors and Comorbidities Comorbidity 3+    Comorbidities See notes    Examination-Activity Limitations Locomotion Level;Transfers;Stand    Examination-Participation Restrictions Shop;Community Activity    Stability/Clinical Decision Making Evolving/Moderate complexity    Rehab Potential Good    PT Frequency 2x / week    PT Duration Other (comment)   for 8 weeks, plus 1 additional visit week of eval   PT Treatment/Interventions ADLs/Self Care Home Management;DME Instruction;Neuromuscular re-education;Balance training;Therapeutic exercise;Therapeutic activities;Functional mobility training;Stair training;Gait training;Patient/family education    PT Next Visit Plan continue with  functional strengtheing, balance; gait training with RW>progression to cane as appropriate.    PT Home Exercise Plan Access Code: [email or web address]    Consulted and Agree with Plan of Care Patient;Family member/caregiver    Family Member Consulted wife-[name]           Patient will benefit from skilled therapeutic intervention in order to improve the following deficits and impairments:  Abnormal gait,Difficulty walking,Decreased safety awareness,Decreased balance,Decreased mobility,Decreased strength  Visit Diagnosis: Muscle weakness (generalized)  Unsteadiness on feet  Other abnormalities of gait and mobility     Problem List Patient Active Problem List   Diagnosis Date Noted  . Dyslipidemia  05/23/2020  . Benign essential HTN 05/06/2020  . Subarachnoid hemorrhage ([place]) 04/30/2020  . Leukopenia 07/15/2018  . Thrombocytopenia ([place]) 07/15/2018  . PCP NOTES >>>>>>> 03/23/2015  . Annual physical exam 03/20/2015  . Bronchospasm 09/06/2014  . DJD (degenerative joint disease) 09/06/2014  . Hypothyroidism 08/28/2014  . Tremor 07/29/2014  . Gait disorder 07/29/2014  . Gout 01/08/2014  . Vitamin D deficiency 11/25/2012  . Melanoma of skin, site unspecified 11/25/2012  . 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 Alcoholism in remission ([place]) 06/19/2011  . Diverticulitis of colon (without mention of hemorrhage)(562.11) 06/19/2011  . Bipolar I disorder ([place]) 06/19/2011  . Osteopenia 05/17/2010  . Alcohol abuse 05/16/2010  . HYPERTRIGLYCERIDEMIA 08/02/2009  . Macrocytic anemia 01/10/2009  . ABNORMAL ELECTROCARDIOGRAM 11/26/2008  . REFLUX, ESOPHAGEAL 11/17/2006  . COMMON MIGRAINE 09/22/2006    Kerrie Pleasure, PT 06/05/2020, 8:53 AM  [facility] [address] [place], [name], [address] Phone: [phone]   Fax:  [phone]  Name: Jeffrey Acosta MRN: [id number] Date of Birth: [birth year]

## 2020-06-05 NOTE — Therapy (Signed)
[facility] [address] [place], [name], [address] Phone: [phone]   Fax:  [phone]  Speech Language Pathology Treatment  Patient Details  Name: Jeffrey Acosta MRN: [id number] Date of Birth: 21-Mar-1951 Referring Provider (SLP): Florencia Reasons (referring); Deetta Perla, MD (doc)   Encounter Date: 06/05/2020   End of Session - 06/05/20 1447    Visit Number 3    Number of Visits 17    Date for SLP Re-Evaluation 08/16/20    SLP Start Time 0850    SLP Stop Time  0931    SLP Time Calculation (min) 41 min    Activity Tolerance Patient tolerated treatment well           Past Medical History:  Diagnosis Date  . Alcoholism in remission ([place])    in [place]  . Arrhythmia    atrial tachycardia  . Bipolar affective disorder ([place])   . Diverticulosis of colon (without mention of hemorrhage)   . GERD (gastroesophageal reflux disease)   . Gout of multiple sites   . Hiatal hernia   . Hx of migraines   . Hydrocele   . HYPERLIPIDEMIA 01/10/2009   Qualifier: Diagnosis of  By: Ronnald Ramp CMA, Chemira    NMR Lipoprofile 2011 LDL could not be calculated  Due to TG of 889  (811  / 750 ),  HDL 50 . Father MI @ 58 PGM CVA early 30s; PGF CVA @72    . Hypothyroidism   . Melanoma of skin, site unspecified 11/25/2012   07/2011 abdominal  Stage 1 Dr Amy Martinique Dr Sarajane Jews   . Osteopenia   . Polyarthralgia     Past Surgical History:  Procedure Laterality Date  . BILIARY BRUSHING  12/07/2018   Procedure: BILIARY BRUSHING;  Surgeon: Rush Landmark Telford Nab., MD;  Location: [facility];  Service: Gastroenterology;;  . BILIARY DILATION  12/07/2018   Procedure: BILIARY DILATION;  Surgeon: Irving Copas., MD;  Location: [facility];  Service: Gastroenterology;;  . BIOPSY  12/07/2018   Procedure: BIOPSY;  Surgeon: Irving Copas., MD;  [facility]: [facility];  Service: Gastroenterology;;  . COLONOSCOPY  2003   negative ; Dr Sharlett Iles  .  ERCP N/A 12/07/2018   Procedure: ENDOSCOPIC RETROGRADE CHOLANGIOPANCREATOGRAPHY (ERCP);  Surgeon: Irving Copas., MD;  Location: [facility];  Service: Gastroenterology;  Laterality: N/A;  . ESOPHAGOGASTRODUODENOSCOPY (EGD) WITH PROPOFOL N/A 12/07/2018   Procedure: ESOPHAGOGASTRODUODENOSCOPY (EGD) WITH PROPOFOL;  Surgeon: Rush Landmark Telford Nab., MD;  Location: [facility];  Service: Gastroenterology;  Laterality: N/A;  . EUS N/A 12/07/2018   Procedure: UPPER ENDOSCOPIC ULTRASOUND (EUS) RADIAL;  Surgeon: Rush Landmark Telford Nab., MD;  Location: [facility];  Service: Gastroenterology;  Laterality: N/A;  . INGUINAL HERNIA REPAIR  08/07/2011   Procedure: HERNIA REPAIR INGUINAL ADULT;  Surgeon: Rolm Bookbinder, MD;  Location: [facility];  Service: General;  Laterality: Right;  . IR ANGIO INTRA EXTRACRAN SEL INTERNAL CAROTID BILAT MOD SED  05/14/2020  . IR ANGIO VERTEBRAL SEL VERTEBRAL BILAT MOD SED  05/14/2020  . MELANOMA EXCISION  07/28/11   Dr Sarajane Jews; stomach and chest  . REMOVAL OF STONES  12/07/2018   Procedure: REMOVAL OF STONES;  Surgeon: Rush Landmark Telford Nab., MD;  Location: [facility];  Service: Gastroenterology;;  . [name]  1951  . 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 UPPER GASTROINTESTINAL ENDOSCOPY  1983   hiatal hernia  . VASECTOMY  1979    There were no vitals filed  for this visit.   Subjective Assessment - 06/05/20 0907    Subjective "I came from PT"    Patient is accompained by: Family member   wife-Jeffrey Acosta   Currently in Pain? No/denies                 ADULT SLP TREATMENT - 06/05/20 1442      General Information   Behavior/Cognition Alert;Cooperative;Pleasant mood      Treatment Provided   Treatment provided Cognitive-Linquistic      Cognitive-Linquistic Treatment   Treatment focused on Aphasia    Skilled Treatment Jeffrey Acosta cont to endorse word finding issues in conversation - he is using synonym and circumlocution  strategies. Wife provided example of pt using circumlocution strategy. SLP administered [employer] today with pt score as 45/60 (within normal limits). Jeffrey Acosta stated word finding was frustrating to him. SLP explained semantic feature analysis (SFA) to pt and used examples with pictures - pt req'd min-mod cues rarely. SLP explained rationale for SFA to pt/wife and told him to do this matrix when he has difficulty naming a noun.      Assessment / Recommendations / Plan   Plan Continue with current plan of care      Progression Toward Goals   Progression toward goals Progressing toward goals            SLP Education - 06/05/20 1447    Education Details semantic feature analysis procedure and rationale    Person(s) Educated Patient;Spouse    Methods Explanation;Demonstration;Verbal cues;Handout    Comprehension Verbalized understanding;Returned demonstration;Verbal cues required;Need further instruction            SLP Short Term Goals - 06/05/20 1448      SLP SHORT TERM GOAL #1   Title Pt will demo selective attention in a mod noisy environment for 10 minutes with rare min A in 3 sessions    Time 3    Period Weeks    Status On-going      SLP SHORT TERM GOAL #2   Title pt will demonstrate intellectual awareness for 3 cognitive deficits by giving examples in 2 sessions    Time 3    Period Weeks    Status On-going      SLP SHORT TERM GOAL #3   Title pt will use memory compensations for more independent medication adminstration    Time 3    Period Weeks    Status On-going      SLP SHORT TERM GOAL #4   Title pt will complete cognitive lingiustic and aphasia evaluations PRN    Status Achieved      SLP SHORT TERM GOAL #5   Title pt will engage in mod complex conversation for 15 minutes with compensations in 3 sessions    Time 3    Period Weeks    Status On-going            SLP Long Term Goals - 06/05/20 1449      SLP LONG TERM GOAL #1   Title pt will  demonstrate divided attention between two simple cognitive linguistic tasks with 100% success each - with compensations (double checking, etc) in 3 sessions    Time 7    Period Weeks   or 17 sessions, for all LTGs   Status On-going      SLP LONG TERM GOAL #2   Title pt will demo anticipatory awareness by double checking cognitive-linguistic thearpy work 100% of the time    Time  7    Period Weeks    Status On-going      SLP LONG TERM GOAL #3   Title pt will use memory compensations succesfully for errands, med administration/refills, to-do lists, etc between 3 sessions    Time 7    Period Weeks    Status On-going      SLP LONG TERM GOAL #4   Title pt will engage in 15 minutes mod complex/complex conversation successfully using compensation strategies in 3 sessions    Time 7    Period Weeks    Status On-going            Plan - 06/05/20 1447    Clinical Impression Statement Jeffrey Acosta presents today with reported anomia cont'd x3-4/day.See "skilled intervention" for more details.Pt will benefit from skilled ST addressing cognitive linguistics and aphasia in conversation.    Speech Therapy Frequency 2x / week    Duration 8 weeks   or 17 sessions   Treatment/Interventions Environmental controls;Language facilitation;Cueing hierarchy;Cognitive reorganization;Internal/external aids;Patient/family education;SLP instruction and feedback;Functional tasks    Potential to Achieve Goals Good    Consulted and Agree with Plan of Care Patient           Patient will benefit from skilled therapeutic intervention in order to improve the following deficits and impairments:   Aphasia  Cognitive communication deficit    Problem List Patient Active Problem List   Diagnosis Date Noted  . Dyslipidemia 05/23/2020  . Benign essential HTN 05/06/2020  . Subarachnoid hemorrhage ([place]) 04/30/2020  . Leukopenia 07/15/2018  . Thrombocytopenia ([place]) 07/15/2018  . PCP NOTES >>>>>>> 03/23/2015  . Annual  physical exam 03/20/2015  . Bronchospasm 09/06/2014  . DJD (degenerative joint disease) 09/06/2014  . Hypothyroidism 08/28/2014  . Tremor 07/29/2014  . Gait disorder 07/29/2014  . Gout 01/08/2014  . Vitamin D deficiency 11/25/2012  . Melanoma of skin, site unspecified 11/25/2012  . 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 Alcoholism in remission ([place]) 06/19/2011  . Diverticulitis of colon (without mention of hemorrhage)(562.11) 06/19/2011  . Bipolar I disorder ([place]) 06/19/2011  . Osteopenia 05/17/2010  . Alcohol abuse 05/16/2010  . HYPERTRIGLYCERIDEMIA 08/02/2009  . Macrocytic anemia 01/10/2009  . ABNORMAL ELECTROCARDIOGRAM 11/26/2008  . REFLUX, ESOPHAGEAL 11/17/2006  . COMMON MIGRAINE 09/22/2006    [facility] ,[place], CCC-SLP  06/05/2020, 2:49 PM  [facility] [address] [place], [name], [address] Phone: [phone]   Fax:  [phone]   Name: Jeffrey Acosta MRN: [id number] Date of Birth: 05/25/1950

## 2020-06-07 DIAGNOSIS — R55 Syncope and collapse: Secondary | ICD-10-CM | POA: Diagnosis not present

## 2020-06-10 ENCOUNTER — Ambulatory Visit: Payer: Medicare Other | Admitting: Occupational Therapy

## 2020-06-10 ENCOUNTER — Other Ambulatory Visit: Payer: Self-pay

## 2020-06-10 ENCOUNTER — Ambulatory Visit: Payer: Medicare Other | Admitting: Physical Therapy

## 2020-06-10 ENCOUNTER — Ambulatory Visit: Payer: Medicare Other

## 2020-06-10 DIAGNOSIS — R41841 Cognitive communication deficit: Secondary | ICD-10-CM

## 2020-06-10 DIAGNOSIS — I69018 Other symptoms and signs involving cognitive functions following nontraumatic subarachnoid hemorrhage: Secondary | ICD-10-CM | POA: Diagnosis not present

## 2020-06-10 DIAGNOSIS — M6281 Muscle weakness (generalized): Secondary | ICD-10-CM | POA: Diagnosis not present

## 2020-06-10 DIAGNOSIS — R4701 Aphasia: Secondary | ICD-10-CM | POA: Diagnosis not present

## 2020-06-10 DIAGNOSIS — R2689 Other abnormalities of gait and mobility: Secondary | ICD-10-CM | POA: Diagnosis not present

## 2020-06-10 DIAGNOSIS — R2681 Unsteadiness on feet: Secondary | ICD-10-CM | POA: Diagnosis not present

## 2020-06-10 NOTE — Therapy (Signed)
[facility] [address] [place], [name], [address] Phone: [phone]   Fax:  [phone]  Speech Language Pathology Treatment  Patient Details  Name: Jeffrey Acosta MRN: [id number] Date of Birth: [birth year] Referring Provider (SLP): Florencia Reasons (referring); Deetta Perla, MD (doc)   Encounter Date: 06/10/2020   End of Session - 06/10/20 1448    Visit Number 4    Number of Visits 17    Date for SLP Re-Evaluation 08/16/20    SLP Start Time 0805    SLP Stop Time  0846    SLP Time Calculation (min) 41 min    Activity Tolerance Patient tolerated treatment well           Past Medical History:  Diagnosis Date  . Alcoholism in remission ([place])    in [place]  . Arrhythmia    atrial tachycardia  . Bipolar affective disorder ([place])   . Diverticulosis of colon (without mention of hemorrhage)   . GERD (gastroesophageal reflux disease)   . Gout of multiple sites   . Hiatal hernia   . Hx of migraines   . Hydrocele   . HYPERLIPIDEMIA 01/10/2009   Qualifier: Diagnosis of  By: Ronnald Ramp CMA, Chemira    NMR Lipoprofile 2011 LDL could not be calculated  Due to TG of 889  (811  / 750 ),  HDL 50 . Father MI @ 54 PGM CVA early 70s; PGF CVA @72    . Hypothyroidism   . Melanoma of skin, site unspecified 11/25/2012   07/2011 abdominal  Stage 1 Dr Amy [place] Dr Sarajane Jews   . Osteopenia   . Polyarthralgia     Past Surgical History:  Procedure Laterality Date  . BILIARY BRUSHING  12/07/2018   Procedure: BILIARY BRUSHING;  Surgeon: Rush Landmark Telford Nab., MD;  Location: [facility];  Service: Gastroenterology;;  . BILIARY DILATION  12/07/2018   Procedure: BILIARY DILATION;  Surgeon: Irving Copas., MD;  Location: [facility];  Service: Gastroenterology;;  . BIOPSY  12/07/2018   Procedure: BIOPSY;  Surgeon: Irving Copas., MD;  Location: [facility];  Service: Gastroenterology;;  . COLONOSCOPY  2003   negative ; Dr Sharlett Iles   . ERCP N/A 12/07/2018   Procedure: ENDOSCOPIC RETROGRADE CHOLANGIOPANCREATOGRAPHY (ERCP);  Surgeon: Irving Copas., MD;  Location: [facility];  Service: Gastroenterology;  Laterality: N/A;  . ESOPHAGOGASTRODUODENOSCOPY (EGD) WITH PROPOFOL N/A 12/07/2018   Procedure: ESOPHAGOGASTRODUODENOSCOPY (EGD) WITH PROPOFOL;  Surgeon: Rush Landmark Telford Nab., MD;  Location: [facility];  Service: Gastroenterology;  Laterality: N/A;  . EUS N/A 12/07/2018   Procedure: UPPER ENDOSCOPIC ULTRASOUND (EUS) RADIAL;  Surgeon: Rush Landmark Telford Nab., MD;  Location: [facility];  Service: Gastroenterology;  Laterality: N/A;  . INGUINAL HERNIA REPAIR  08/07/2011   Procedure: HERNIA REPAIR INGUINAL ADULT;  Surgeon: Rolm Bookbinder, MD;  Location: [facility];  Service: General;  Laterality: Right;  . IR ANGIO INTRA EXTRACRAN SEL INTERNAL CAROTID BILAT MOD SED  05/14/2020  . IR ANGIO VERTEBRAL SEL VERTEBRAL BILAT MOD SED  05/14/2020  . MELANOMA EXCISION  07/28/11   Dr Sarajane Jews; stomach and chest  . REMOVAL OF STONES  12/07/2018   Procedure: REMOVAL OF STONES;  Surgeon: Rush Landmark Telford Nab., MD;  Location: [facility];  Service: Gastroenterology;;  . [name]  1951  . 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 UPPER GASTROINTESTINAL ENDOSCOPY  1983   hiatal hernia  . VASECTOMY  1979    There were no vitals filed  for this visit.   Subjective Assessment - 06/10/20 0810    Subjective "I didnt do anything with this (semantic feature    Patient is accompained by: Family member   Jeffrey Acosta- wife                ADULT SLP TREATMENT - 06/10/20 0847      General Information   Behavior/Cognition Alert;Cooperative;Pleasant mood;Hard of hearing;Requires cueing      Treatment Provided   Treatment provided Cognitive-Linquistic      Cognitive-Linquistic Treatment   Treatment focused on Aphasia    Skilled Treatment SLP introduced pt and wife to [employer]  today (VNeST). Pt req'd mod A occasionally for alternating attention in this new task. Pt perseverated on the subject being himself for 50% of the 6 subjects he generated. Cognition (attention) negatively impacted pt's performance on this novel task. SLP used louder speech as pt demonstrated s/sx of being hard of hearing ("What?" 20% of the time or cupping hand to ear.)      Assessment / Recommendations / Plan   Plan Continue with current plan of care      Progression Toward Goals   Progression toward goals Progressing toward goals            SLP Education - 06/10/20 1447    Education Details VNeST procedure and rationale    Person(s) Educated Patient;Spouse    Methods Explanation;Demonstration;Verbal cues;Handout    Comprehension Verbalized understanding;Returned demonstration;Verbal cues required;Need further instruction            SLP Short Term Goals - 06/10/20 1449      SLP SHORT TERM GOAL #1   Title Pt will demo selective attention in a mod noisy environment for 10 minutes with rare min A in 3 sessions    Time 2    Period Weeks    Status On-going      SLP SHORT TERM GOAL #2   Title pt will demonstrate intellectual awareness for 3 cognitive deficits by giving examples in 2 sessions    Time 2    Period Weeks    Status On-going      SLP SHORT TERM GOAL #3   Title pt will use memory compensations for more independent medication adminstration    Time 2    Period Weeks    Status On-going      SLP SHORT TERM GOAL #4   Title pt will complete cognitive lingiustic and aphasia evaluations PRN    Status Achieved      SLP SHORT TERM GOAL #5   Title pt will engage in mod complex conversation for 15 minutes with compensations in 3 sessions    Time 2    Period Weeks    Status On-going            SLP Long Term Goals - 06/10/20 1449      SLP LONG TERM GOAL #1   Title pt will demonstrate divided attention between two simple cognitive linguistic tasks with 100% success  each - with compensations (double checking, etc) in 3 sessions    Time 6    Period Weeks   or 17 sessions, for all LTGs   Status On-going      SLP LONG TERM GOAL #2   Title pt will demo anticipatory awareness by double checking cognitive-linguistic thearpy work 100% of the time    Time 6    Period Weeks    Status On-going  SLP LONG TERM GOAL #3   Title pt will use memory compensations succesfully for errands, med administration/refills, to-do lists, etc between 3 sessions    Time 6    Period Weeks    Status On-going      SLP LONG TERM GOAL #4   Title pt will engage in 15 minutes mod complex/complex conversation successfully using compensation strategies in 3 sessions    Time 6    Period Weeks    Status On-going            Plan - 06/10/20 1448    Clinical Impression Statement Jeffrey Acosta presents today with reported anomia cont'd x3-4/day. See "skilled intervention" for more details - VNeST was introduced today and pt req'd cues from SLP due to decr'd attention/cognition. Pt will benefit from skilled ST addressing cognitive linguistics and aphasia in conversation.    Speech Therapy Frequency 2x / week    Duration 8 weeks   or 17 sessions   Treatment/Interventions Environmental controls;Language facilitation;Cueing hierarchy;Cognitive reorganization;Internal/external aids;Patient/family education;SLP instruction and feedback;Functional tasks    Potential to Achieve Goals Good    Consulted and Agree with Plan of Care Patient           Patient will benefit from skilled therapeutic intervention in order to improve the following deficits and impairments:   Aphasia  Cognitive communication deficit    Problem List Patient Active Problem List   Diagnosis Date Noted  . Dyslipidemia 05/23/2020  . Benign essential HTN 05/06/2020  . Subarachnoid hemorrhage ([place]) 04/30/2020  . Leukopenia 07/15/2018  . Thrombocytopenia ([place]) 07/15/2018  . PCP NOTES >>>>>>> 03/23/2015  . Annual  physical exam 03/20/2015  . Bronchospasm 09/06/2014  . DJD (degenerative joint disease) 09/06/2014  . Hypothyroidism 08/28/2014  . Tremor 07/29/2014  . Gait disorder 07/29/2014  . Gout 01/08/2014  . Vitamin D deficiency 11/25/2012  . Melanoma of skin, site unspecified 11/25/2012  . 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 Alcoholism in remission ([place]) 06/19/2011  . Diverticulitis of colon (without mention of hemorrhage)(562.11) 06/19/2011  . Bipolar I disorder ([place]) 06/19/2011  . Osteopenia 05/17/2010  . Alcohol abuse 05/16/2010  . HYPERTRIGLYCERIDEMIA 08/02/2009  . Macrocytic anemia 01/10/2009  . ABNORMAL ELECTROCARDIOGRAM 11/26/2008  . REFLUX, ESOPHAGEAL 11/17/2006  . COMMON MIGRAINE 09/22/2006    [facility] ,[place], [place]  06/10/2020, 2:50 PM  [facility] [address] [place], [name], [address] Phone: [phone]   Fax:  [phone]   Name: Jeffrey Acosta MRN: [id number] Date of Birth: 1950/09/20

## 2020-06-10 NOTE — Therapy (Signed)
[facility] [address] [place], [name], [address] Phone: [phone]   Fax:  [phone]  Occupational Therapy Treatment  Patient Details  Name: Jeffrey Acosta MRN: [id number] Date of Birth: 1951-01-19 Referring Provider (OT): Dr. Florencia Reasons   Encounter Date: 06/10/2020   OT End of Session - 06/10/20 0850    Visit Number 5    Number of Visits 17    Date for OT Re-Evaluation 07/19/20    Authorization Type BCBS Medicare, copay per disc, no auth/VL (cognitive retraining not covered)    Authorization Time Period cert. date 05/21/19-08/18/20    Authorization - Visit Number 5    Authorization - Number of Visits 10    OT Start Time [phone]    OT Stop Time 0930    OT Time Calculation (min) 38 min    Activity Tolerance Patient tolerated treatment well    Behavior During Therapy Impulsive           Past Medical History:  Diagnosis Date  . Alcoholism in remission ([place])    in [place]  . Arrhythmia    atrial tachycardia  . Bipolar affective disorder ([place])   . Diverticulosis of colon (without mention of hemorrhage)   . GERD (gastroesophageal reflux disease)   . Gout of multiple sites   . Hiatal hernia   . Hx of migraines   . Hydrocele   . HYPERLIPIDEMIA 01/10/2009   Qualifier: Diagnosis of  By: Ronnald Ramp CMA, Chemira    NMR Lipoprofile 2011 LDL could not be calculated  Due to TG of 889  (811  / 750 ),  HDL 50 . Father MI @ 9 PGM CVA early 38s; PGF CVA '@72'    . Hypothyroidism   . Melanoma of skin, site unspecified 11/25/2012   07/2011 abdominal  Stage 1 Dr Amy [place] Dr Sarajane Jews   . Osteopenia   . Polyarthralgia     Past Surgical History:  Procedure Laterality Date  . BILIARY BRUSHING  12/07/2018   Procedure: BILIARY BRUSHING;  Surgeon: Rush Landmark Telford Nab., MD;  Location: [facility];  Service: Gastroenterology;;  . BILIARY DILATION  12/07/2018   Procedure: BILIARY DILATION;  Surgeon: Irving Copas., MD;  Location: [facility];  Service: Gastroenterology;;  . BIOPSY  12/07/2018   Procedure: BIOPSY;  Surgeon: Irving Copas., MD;  [facility]: [facility];  Service: Gastroenterology;;  . COLONOSCOPY  2003   negative ; Dr Sharlett Iles  . ERCP N/A 12/07/2018   Procedure: ENDOSCOPIC RETROGRADE CHOLANGIOPANCREATOGRAPHY (ERCP);  Surgeon: Irving Copas., MD;  Location: [facility];  Service: Gastroenterology;  Laterality: N/A;  . ESOPHAGOGASTRODUODENOSCOPY (EGD) WITH PROPOFOL N/A 12/07/2018   Procedure: ESOPHAGOGASTRODUODENOSCOPY (EGD) WITH PROPOFOL;  Surgeon: Rush Landmark Telford Nab., MD;  Location: [facility];  Service: Gastroenterology;  Laterality: N/A;  . EUS N/A 12/07/2018   Procedure: UPPER ENDOSCOPIC ULTRASOUND (EUS) RADIAL;  Surgeon: Rush Landmark Telford Nab., MD;  Location: [facility];  Service: Gastroenterology;  Laterality: N/A;  . INGUINAL HERNIA REPAIR  08/07/2011   Procedure: HERNIA REPAIR INGUINAL ADULT;  Surgeon: Rolm Bookbinder, MD;  Location: [facility];  Service: General;  Laterality: Right;  . IR ANGIO INTRA EXTRACRAN SEL INTERNAL CAROTID BILAT MOD SED  05/14/2020  . IR ANGIO VERTEBRAL SEL VERTEBRAL BILAT MOD SED  05/14/2020  . MELANOMA EXCISION  07/28/11   Dr Sarajane Jews; stomach and chest  . REMOVAL OF STONES  12/07/2018   Procedure: REMOVAL OF STONES;  Surgeon: Rush Landmark Telford Nab., MD;  Location: [facility];  Service: Gastroenterology;;  . TONSILLECTOMY  1951  . 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 UPPER GASTROINTESTINAL ENDOSCOPY  1983   hiatal hernia  . VASECTOMY  1979    There were no vitals filed for this visit.   Subjective Assessment - 06/10/20 0849    Subjective  pt reports that he is doing more at home  (making snack, cleaning up after himself, making bed, has went to the store and able to walk around)    Pertinent History subarachnoid hemorrhage (CT 04/30/20 showed moderate to large acute SAH; 05/11/20 MRI brain showed small acute infarcts  in L basal ganglia, L periatrial temporal lobe and high bilateral frontoparietal regions).  PMH:  hx of alcohol use with intoxication on hospital presentation, hypothyroidism, atrial tachycardia, Barrett's esophagus, hx of atrial tachycardia, osteopenia, thrombocytopenia, DJD, gout, melanoma of skin, bipolar disorder    Patient Stated Goals return to previous activities    Currently in Pain? No/denies           Constant Therapy Alternating Symbol Match:  Level 8 with 89% accuracy and 144.55 average response time (got off 1x which affected accuracy with sequencing).   Environmental scanning for dynamic balance/alternating attention with 13/15 items found on first pass with and remaining 2 found on 2nd pass.  No LOB with bending/reaching, scanning/turning.  Pt ambulated with close supervision, but no device.  Standing with each LE on balance disc with CGA while performing functional reaching to place small pegs in vertical pegboard to copy design.  Copied design with 100% accuracy, but 3 drops.  However, good balance and no impulsivity noted with dual task.   Discussed slowing down to help with balance.        OT Short Term Goals - 06/10/20 0854      OT SHORT TERM GOAL #1   Title Pt will be independent with HEP for bilateral shoulder strength.--check STGs 06/20/20    Time 4    Period Weeks    Status New      OT SHORT TERM GOAL #2   Title Pt will be able to perform shower transfer with supervision.    Time 4    Period Weeks    Status Achieved   06/10/20:  met at this level     OT [place] #3   Title Pt will be able to perform simple alternating attention with at least 90% accuracy for incr safety.    Time 4    Period Weeks    Status New      OT SHORT TERM GOAL #4   Title Pt will perform mod complex functional problem solving with at least 90% accuracy.    Time 4    Period Weeks    Status New      OT SHORT TERM GOAL #5   Title Pt will verbalize understanding of  cognitive compensation strategies for ADLs/IADLs.    Time 4    Period Weeks    Status New             OT Long Term Goals - 05/20/20 0925      OT LONG TERM GOAL #1   Title Pt will able to reach at least 10" bilaterally on standing functional reach test without cueing/impulsivity for improved safety for IADLs.    Time 8    Period Weeks    Status New      OT LONG TERM GOAL #2   Title  Pt will demo good safety awareness for retrieving items from low shelf/floor mod I/without cueing.    Time 8    Period Weeks    Status New      OT LONG TERM GOAL #3   Title Pt will perform simple prior cooking/home maintenance tasks mod I and demonstrating good safety awareness/problem solving.    Time 8    Period Weeks    Status New      OT LONG TERM GOAL #4   Title Pt will perform simple divided attention with at least 1 physical and 1 cognitive tasks for improved safety.    Time 8    Period Weeks    Status New      OT LONG TERM GOAL #5   Title Pt will perform shower transfer mod I.    Time 8    Period Weeks    Status New                 Plan - 06/10/20 0850    Clinical Impression Statement Pt is progressing towards goals with improving IADL performance at home.  Pt also with improving attention and decr impulsivity today.    OT Occupational Profile and History Detailed Assessment- Review of Records and additional review of physical, cognitive, psychosocial history related to current functional performance    Occupational performance deficits (Please refer to evaluation for details): ADL's;IADL's    Body Structure / Function / Physical Skills ADL;Strength;Balance;IADL;Endurance;Mobility;Coordination;Decreased knowledge of precautions    Cognitive Skills Problem Solve;Safety Awareness;Attention;Memory;Sequencing    Rehab Potential Good    Clinical Decision Making Several treatment options, min-mod task modification necessary    Comorbidities Affecting Occupational Performance: May  have comorbidities impacting occupational performance    Modification or Assistance to Complete Evaluation  Min-Moderate modification of tasks or assist with assess necessary to complete eval    OT Frequency 2x / week    OT Duration 8 weeks   +eval or 17 total visits   OT Treatment/Interventions Self-care/ADL training;DME and/or AE instruction;Balance training;Therapeutic activities;Cognitive remediation/compensation;Therapeutic exercise;Neuromuscular education;Functional Mobility Training;Patient/family education;Manual Therapy;Passive range of motion;Energy conservation;Cryotherapy;Moist Heat    Plan cooking task; continue to address functional cognition and safety with mobility due to impiulsivity    Consulted and Agree with Plan of Care Patient;Family member/caregiver    Family Member Consulted wife           Patient will benefit from skilled therapeutic intervention in order to improve the following deficits and impairments:   Body Structure / Function / Physical Skills: ADL,Strength,Balance,IADL,Endurance,Mobility,Coordination,Decreased knowledge of precautions Cognitive Skills: Problem Solve,Safety Awareness,Attention,Memory,Sequencing     Visit Diagnosis: Other symptoms and signs involving cognitive functions following nontraumatic subarachnoid hemorrhage  Muscle weakness (generalized)  Unsteadiness on feet    Problem List Patient Active Problem List   Diagnosis Date Noted  . Dyslipidemia 05/23/2020  . Benign essential HTN 05/06/2020  . Subarachnoid hemorrhage ([place]) 04/30/2020  . Leukopenia 07/15/2018  . Thrombocytopenia ([place]) 07/15/2018  . PCP NOTES >>>>>>> 03/23/2015  . Annual physical exam 03/20/2015  . Bronchospasm 09/06/2014  . DJD (degenerative joint disease) 09/06/2014  . Hypothyroidism 08/28/2014  . Tremor 07/29/2014  . Gait disorder 07/29/2014  . Gout 01/08/2014  . Vitamin D deficiency 11/25/2012  . Melanoma of skin, site unspecified 11/25/2012  .  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 Alcoholism in remission ([place]) 06/19/2011  . Diverticulitis of colon (without mention of hemorrhage)(562.11) 06/19/2011  . Bipolar I disorder ([place]) 06/19/2011  . Osteopenia 05/17/2010  .  Alcohol abuse 05/16/2010  . HYPERTRIGLYCERIDEMIA 08/02/2009  . Macrocytic anemia 01/10/2009  . ABNORMAL ELECTROCARDIOGRAM 11/26/2008  . REFLUX, ESOPHAGEAL 11/17/2006  . COMMON MIGRAINE 09/22/2006    [facility] 06/10/2020, 9:52 AM  [facility] [address] [place], [name], [address] Phone: [phone]   Fax:  [phone]  Name: CHAO BLAZEJEWSKI MRN: [id number] Date of Birth: 07/06/1950   Vianne Bulls, OTR/L [facility] [address] [place], [place]  [address] [phone] phone [phone] 06/10/20 9:52 AM

## 2020-06-10 NOTE — Patient Instructions (Signed)
  Agricultural consultant  (VNeST) Do at least a couple of these per day   Pick a common verb from the list of transitive verbs  Step 1: Write three subjects and three corresponding objects Step 2: Say and write three sentences using the corresponding subjects and objects Step 3: Choose one the three sentences to expand, using any combinations of "wh" questions: when, how, why, where

## 2020-06-12 ENCOUNTER — Other Ambulatory Visit: Payer: Self-pay

## 2020-06-12 ENCOUNTER — Ambulatory Visit: Payer: Medicare Other

## 2020-06-12 ENCOUNTER — Telehealth: Payer: Self-pay | Admitting: Internal Medicine

## 2020-06-12 ENCOUNTER — Encounter: Payer: Self-pay | Admitting: Physical Therapy

## 2020-06-12 ENCOUNTER — Ambulatory Visit: Payer: Medicare Other | Admitting: Occupational Therapy

## 2020-06-12 ENCOUNTER — Encounter: Payer: Self-pay | Admitting: Occupational Therapy

## 2020-06-12 ENCOUNTER — Ambulatory Visit: Payer: Medicare Other | Admitting: Physical Therapy

## 2020-06-12 VITALS — BP 107/71 | HR 68

## 2020-06-12 DIAGNOSIS — I69018 Other symptoms and signs involving cognitive functions following nontraumatic subarachnoid hemorrhage: Secondary | ICD-10-CM

## 2020-06-12 DIAGNOSIS — M6281 Muscle weakness (generalized): Secondary | ICD-10-CM

## 2020-06-12 DIAGNOSIS — R2681 Unsteadiness on feet: Secondary | ICD-10-CM | POA: Diagnosis not present

## 2020-06-12 DIAGNOSIS — R4701 Aphasia: Secondary | ICD-10-CM | POA: Diagnosis not present

## 2020-06-12 DIAGNOSIS — R41841 Cognitive communication deficit: Secondary | ICD-10-CM

## 2020-06-12 DIAGNOSIS — R2689 Other abnormalities of gait and mobility: Secondary | ICD-10-CM | POA: Diagnosis not present

## 2020-06-12 MED ORDER — FOLIC ACID 1 MG PO TABS: 1.0000 mg | ORAL_TABLET | Freq: Every day | ORAL | 3 refills | Status: DC

## 2020-06-12 MED ORDER — VITAMIN B-1 250 MG PO TABS
250.0000 mg | ORAL_TABLET | Freq: Every day | ORAL | 3 refills | Status: AC
Start: 2020-06-12 — End: ?

## 2020-06-12 NOTE — Telephone Encounter (Signed)
Rxs sent

## 2020-06-12 NOTE — Patient Instructions (Addendum)
  Try the pill organizer with supervision from [facility]. Make sure to double check your work!  Remember to identify two hobbies you would enjoy to discuss in next ST session. Think of hobbies that challenge you both physically and mentally.   Use sticky notes from VNeST in ST session to practice at home.

## 2020-06-12 NOTE — Therapy (Signed)
[facility] [address] [place], [name], [address] Phone: [phone]   Fax:  [phone]  Speech Language Pathology Treatment  Patient Details  Name: Jeffrey Acosta MRN: [id number] Date of Birth: May 10, 1950 Referring Provider (SLP): Florencia Reasons (referring); Deetta Perla, MD (doc)   Encounter Date: 06/12/2020   End of Session - 06/12/20 0930    SLP Start Time 0843    SLP Stop Time  3545    SLP Time Calculation (min) 40 min           Past Medical History:  Diagnosis Date  . Alcoholism in remission ([place])    in [place]  . Arrhythmia    atrial tachycardia  . Bipolar affective disorder ([place])   . Diverticulosis of colon (without mention of hemorrhage)   . GERD (gastroesophageal reflux disease)   . Gout of multiple sites   . Hiatal hernia   . Hx of migraines   . Hydrocele   . HYPERLIPIDEMIA 01/10/2009   Qualifier: Diagnosis of  By: Ronnald Ramp CMA, Chemira    NMR Lipoprofile 2011 LDL could not be calculated  Due to TG of 889  (811  / 750 ),  HDL 50 . Father MI @ 62 PGM CVA early 46s; PGF CVA @72    . Hypothyroidism   . Melanoma of skin, site unspecified 11/25/2012   07/2011 abdominal  Stage 1 Dr Amy [place] Dr Sarajane Jews   . Osteopenia   . Polyarthralgia     Past Surgical History:  Procedure Laterality Date  . BILIARY BRUSHING  12/07/2018   Procedure: BILIARY BRUSHING;  Surgeon: Rush Landmark Telford Nab., MD;  Location: [facility];  Service: Gastroenterology;;  . BILIARY DILATION  12/07/2018   Procedure: BILIARY DILATION;  Surgeon: Irving Copas., MD;  Location: [facility];  Service: Gastroenterology;;  . BIOPSY  12/07/2018   Procedure: BIOPSY;  Surgeon: Irving Copas., MD;  Location: [facility];  Service: Gastroenterology;;  . COLONOSCOPY  2003   negative ; Dr Sharlett Iles  . ERCP N/A 12/07/2018   Procedure: ENDOSCOPIC RETROGRADE CHOLANGIOPANCREATOGRAPHY (ERCP);  Surgeon: Irving Copas., MD;  Location:  [facility];  Service: Gastroenterology;  Laterality: N/A;  . ESOPHAGOGASTRODUODENOSCOPY (EGD) WITH PROPOFOL N/A 12/07/2018   Procedure: ESOPHAGOGASTRODUODENOSCOPY (EGD) WITH PROPOFOL;  Surgeon: Rush Landmark Telford Nab., MD;  [facility]: [facility];  Service: Gastroenterology;  Laterality: N/A;  . EUS N/A 12/07/2018   Procedure: UPPER ENDOSCOPIC ULTRASOUND (EUS) RADIAL;  Surgeon: Rush Landmark Telford Nab., MD;  Location: [facility];  Service: Gastroenterology;  Laterality: N/A;  . INGUINAL HERNIA REPAIR  08/07/2011   Procedure: HERNIA REPAIR INGUINAL ADULT;  Surgeon: Rolm Bookbinder, MD;  Location: [facility];  Service: General;  Laterality: Right;  . IR ANGIO INTRA EXTRACRAN SEL INTERNAL CAROTID BILAT MOD SED  05/14/2020  . IR ANGIO VERTEBRAL SEL VERTEBRAL BILAT MOD SED  05/14/2020  . MELANOMA EXCISION  07/28/11   Dr Sarajane Jews; stomach and chest  . REMOVAL OF STONES  12/07/2018   Procedure: REMOVAL OF STONES;  Surgeon: Rush Landmark Telford Nab., MD;  Location: [facility];  Service: Gastroenterology;;  . Jeffrey Acosta  1953  . 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 UPPER GASTROINTESTINAL ENDOSCOPY  1983   hiatal hernia  . VASECTOMY  1979    There were no vitals filed for this visit.   Subjective Assessment - 06/12/20 0845    Subjective "I didn't really get the concept. I don't talk like that" re: VNeST  Patient is accompained by: Family member   [name]- wife   Currently in Pain? No/denies    Pain Score 0-No pain                 ADULT SLP TREATMENT - 06/12/20 0859      General Information   Behavior/Cognition Alert;Cooperative;Pleasant mood;Hard of hearing;Requires cueing      Treatment Provided   Treatment provided Cognitive-Linquistic      Cognitive-Linquistic Treatment   Treatment focused on Aphasia;Patient/family/caregiver education    Skilled Treatment SLP reviewed VNeST procedure with patient and wife. Pt reported some confusion re: process of  VNeST and rationale as pt endorsed task felt unnature. SLP used visual cues on sticky notes for demonstration, which pt and wife reported improved comprehension and flow of task. Naming WNL for task. SLP provided min verbal/visual cues x2 to attend to question asked. Pt reports word finding has significantly improved from ~15 second delay to ~4-5 seconds. Pt reports utilizing synoyms and circumlocation strategies independently as educated in previous ST sessions with success. Short term memory deficits reportedly persist. SLP introduced use of pill organizer for pt to manage meds at home with [place] from wife, as pt completed task I prior to fall. Limited hobbies and interests reported since fall, in which SLP requested pt ID two hobbies that he would enjoy and challenge him physically/mentally. Pt verbalized understanding and agreement.      Assessment / Recommendations / Plan   Plan Continue with current plan of care      Progression Toward Goals   Progression toward goals Progressing toward goals            SLP Education - 06/12/20 0923    Education Details visual cues for VNeST, medication management, compensations for word finding    Person(s) Educated Patient;Spouse    Methods Explanation;Demonstration;Verbal cues;Handout    Comprehension Verbalized understanding;Returned demonstration;Verbal cues required            SLP Short Term Goals - 06/12/20 0827      SLP SHORT TERM GOAL #1   Title Pt will demo selective attention in a mod noisy environment for 10 minutes with rare min A in 3 sessions    Time 2    Period Weeks    Status On-going      SLP SHORT TERM GOAL #2   Title pt will demonstrate intellectual awareness for 3 cognitive deficits by giving examples in 2 sessions    Baseline 06-12-20    Time 2    Period Weeks    Status On-going      SLP SHORT TERM GOAL #3   Title pt will use memory compensations for more independent medication adminstration    Time 2    Period  Weeks    Status On-going      SLP SHORT TERM GOAL #4   Title pt will complete cognitive lingiustic and aphasia evaluations PRN    Status Achieved      SLP SHORT TERM GOAL #5   Title pt will engage in mod complex conversation for 15 minutes with compensations in 3 sessions    Time 2    Period Weeks    Status On-going            SLP Long Term Goals - 06/12/20 0827      SLP LONG TERM GOAL #1   Title pt will demonstrate divided attention between two simple cognitive linguistic tasks with 100% success each - with compensations (double  checking, etc) in 3 sessions    Time 6    Period Weeks   or 17 sessions, for all LTGs   Status On-going      SLP LONG TERM GOAL #2   Title pt will demo anticipatory awareness by double checking cognitive-linguistic thearpy work 100% of the time    Time 6    Period Weeks    Status On-going      SLP LONG TERM GOAL #3   Title pt will use memory compensations succesfully for errands, med administration/refills, to-do lists, etc between 3 sessions    Time 6    Period Weeks    Status On-going      SLP LONG TERM GOAL #4   Title pt will engage in 15 minutes mod complex/complex conversation successfully using compensation strategies in 3 sessions    Time 6    Period Weeks    Status On-going            Plan - 06/12/20 0930    Clinical Impression Statement Jaydan reports improvements in anomia with decreased time required to name words (15 sec to 4-5 secs). Pt reports utilizing learned word finding compensations (synonym & circumloction) as educated in previous ST sessions with success. Reported persistent difficulty with short term recall (i.e., forgetting why he entered a room). Pt desires to become independent with medicine management again as he did prior to fall. SLP provided recommendations for med management at home. Pt would continue to benefit from skilled ST services to maximize cognitive linguistic skills for return to PLOF.    Speech Therapy  Frequency 2x / week    Duration 8 weeks    Treatment/Interventions Environmental controls;Language facilitation;Cueing hierarchy;Cognitive reorganization;Internal/external aids;Patient/family education;SLP instruction and feedback;Functional tasks    Potential to Achieve Goals Good    Consulted and Agree with Plan of Care Patient;Family member/caregiver           Patient will benefit from skilled therapeutic intervention in order to improve the following deficits and impairments:   Aphasia  Cognitive communication deficit    Problem List Patient Active Problem List   Diagnosis Date Noted  . Dyslipidemia 05/23/2020  . Benign essential HTN 05/06/2020  . Subarachnoid hemorrhage ([place]) 04/30/2020  . Leukopenia 07/15/2018  . Thrombocytopenia ([place]) 07/15/2018  . PCP NOTES >>>>>>> 03/23/2015  . Annual physical exam 03/20/2015  . Bronchospasm 09/06/2014  . DJD (degenerative joint disease) 09/06/2014  . Hypothyroidism 08/28/2014  . Tremor 07/29/2014  . Gait disorder 07/29/2014  . Gout 01/08/2014  . Vitamin D deficiency 11/25/2012  . Melanoma of skin, site unspecified 11/25/2012  . 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 Alcoholism in remission ([place]) 06/19/2011  . Diverticulitis of colon (without mention of hemorrhage)(562.11) 06/19/2011  . Bipolar I disorder ([place]) 06/19/2011  . Osteopenia 05/17/2010  . Alcohol abuse 05/16/2010  . HYPERTRIGLYCERIDEMIA 08/02/2009  . Macrocytic anemia 01/10/2009  . ABNORMAL ELECTROCARDIOGRAM 11/26/2008  . REFLUX, ESOPHAGEAL 11/17/2006  . COMMON MIGRAINE 09/22/2006    Alinda Deem, MA CCC-SLP 06/12/2020, 9:38 AM  [facility] [address] [place], [name], [address] Phone: [phone]   Fax:  [phone]   Name: DAYRON ODLAND MRN: [id number] Date of Birth: Oct 28, 1950

## 2020-06-12 NOTE — Therapy (Signed)
[facility] [address] [place], [name], [address] Phone: [phone]   Fax:  [phone]  Occupational Therapy Treatment  Patient Details  Name: Jeffrey Acosta MRN: [id number] Date of Birth: 1950/09/09 Referring Provider (OT): Dr. Florencia Reasons   Encounter Date: 06/12/2020   OT End of Session - 06/12/20 0744    Visit Number 6    Number of Visits 17    Date for OT Re-Evaluation 07/19/20    Authorization Type BCBS Medicare, copay per disc, no auth/VL (cognitive retraining not covered)    Authorization Time Period cert. date 05/21/19-08/18/20    Authorization - Visit Number 6    Authorization - Number of Visits 10    OT Start Time [id number]    OT Stop Time 0800    OT Time Calculation (min) 39 min           Past Medical History:  Diagnosis Date  . Alcoholism in remission ([place])    in [place]  . Arrhythmia    atrial tachycardia  . Bipolar affective disorder ([place])   . Diverticulosis of colon (without mention of hemorrhage)   . GERD (gastroesophageal reflux disease)   . Gout of multiple sites   . Hiatal hernia   . Hx of migraines   . Hydrocele   . HYPERLIPIDEMIA 01/10/2009   Qualifier: Diagnosis of  By: Ronnald Ramp CMA, Chemira    NMR Lipoprofile 2011 LDL could not be calculated  Due to TG of 889  (811  / 750 ),  HDL 50 . Father MI @ 59 PGM CVA early 15s; PGF CVA _0    . Hypothyroidism   . Melanoma of skin, site unspecified 11/25/2012   07/2011 abdominal  Stage 1 Dr Amy Martinique Dr Sarajane Jews   . Osteopenia   . Polyarthralgia     Past Surgical History:  Procedure Laterality Date  . BILIARY BRUSHING  12/07/2018   Procedure: BILIARY BRUSHING;  Surgeon: Rush Landmark Telford Nab., MD;  Location: [facility];  Service: Gastroenterology;;  . BILIARY DILATION  12/07/2018   Procedure: BILIARY DILATION;  Surgeon: Irving Copas., MD;  Location: [facility];  Service: Gastroenterology;;  . BIOPSY  12/07/2018   Procedure: BIOPSY;  Surgeon:  Irving Copas., MD;  Location: [facility];  Service: Gastroenterology;;  . COLONOSCOPY  2003   negative ; Dr Sharlett Iles  . ERCP N/A 12/07/2018   Procedure: ENDOSCOPIC RETROGRADE CHOLANGIOPANCREATOGRAPHY (ERCP);  Surgeon: Irving Copas., MD;  Location: [facility];  Service: Gastroenterology;  Laterality: N/A;  . ESOPHAGOGASTRODUODENOSCOPY (EGD) WITH PROPOFOL N/A 12/07/2018   Procedure: ESOPHAGOGASTRODUODENOSCOPY (EGD) WITH PROPOFOL;  Surgeon: Rush Landmark Telford Nab., MD;  Location: [facility];  Service: Gastroenterology;  Laterality: N/A;  . EUS N/A 12/07/2018   Procedure: UPPER ENDOSCOPIC ULTRASOUND (EUS) RADIAL;  Surgeon: Rush Landmark Telford Nab., MD;  Location: [facility];  Service: Gastroenterology;  Laterality: N/A;  . INGUINAL HERNIA REPAIR  08/07/2011   Procedure: HERNIA REPAIR INGUINAL ADULT;  Surgeon: Rolm Bookbinder, MD;  Location: [facility];  Service: General;  Laterality: Right;  . IR ANGIO INTRA EXTRACRAN SEL INTERNAL CAROTID BILAT MOD SED  05/14/2020  . IR ANGIO VERTEBRAL SEL VERTEBRAL BILAT MOD SED  05/14/2020  . MELANOMA EXCISION  07/28/11   Dr Sarajane Jews; stomach and chest  . REMOVAL OF STONES  12/07/2018   Procedure: REMOVAL OF STONES;  Surgeon: Rush Landmark Telford Nab., MD;  Location: [facility];  Service: Gastroenterology;;  . [name]  1953  . 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 UPPER GASTROINTESTINAL ENDOSCOPY  1983   hiatal hernia  . VASECTOMY  1979    There were no vitals filed for this visit.         Treatment: Basic cooking task to scramble and egg. Pt located items with  supervision and min questioning cues. No butter was available today, so therapist cued pt to use cooking spray or oil before placing items in pan. Pt was noted to move very quickly and impulsively  So min v.c to slow down for safety. (Pt did not use his cane during task, 1 cue to hold onto countertop when retrieving items from low cabinet.  ) Pt turned on the wrong burner initially. He was able to self correct after several mins. Pt cooked egg and turned off stove safely. Therapist discussed with pt and wife and recommendation that pt only cooks with supervision.  Map reading task on constant therapy: 90% accuracy 56.44 response time, min v.c for several questions. Arm bike x 6 mins level 4 for conditioning                  OT Short Term Goals - 06/12/20 0746      OT SHORT TERM GOAL #1   Title Pt will be independent with HEP for bilateral shoulder strength.--check STGs 06/20/20    Time 4    Period Weeks    Status On-going      OT SHORT TERM GOAL #2   Title Pt will be able to perform shower transfer with supervision.    Time 4    Period Weeks    Status Achieved   06/10/20:  met at this level     OT [place] #3   Title Pt will be able to perform simple alternating attention with at least 90% accuracy for incr safety.    Time 4    Period Weeks    Status On-going      OT SHORT TERM GOAL #4   Title Pt will perform mod complex functional problem solving with at least 90% accuracy.    Time 4    Period Weeks    Status On-going      OT SHORT TERM GOAL #5   Title Pt will verbalize understanding of cognitive compensation strategies for ADLs/IADLs.    Time 4    Period Weeks    Status On-going             OT Long Term Goals - 05/20/20 0925      OT LONG TERM GOAL #1   Title Pt will able to reach at least 10" bilaterally on standing functional reach test without cueing/impulsivity for improved safety for IADLs.    Time 8    Period Weeks    Status New      OT LONG TERM GOAL #2   Title Pt will demo good safety awareness for retrieving items from low shelf/floor mod I/without cueing.    Time 8    Period Weeks    Status New      OT LONG TERM GOAL #3   Title Pt will perform simple prior cooking/home maintenance tasks mod I and demonstrating good safety awareness/problem solving.    Time 8     Period Weeks    Status New      OT LONG TERM GOAL #4   Title Pt will perform simple divided attention with at least 1 physical and 1 cognitive tasks for improved safety.  Time 8    Period Weeks    Status New      OT LONG TERM GOAL #5   Title Pt will perform shower transfer mod I.    Time 8    Period Weeks    Status New                  Patient will benefit from skilled therapeutic intervention in order to improve the following deficits and impairments:           Visit Diagnosis: Muscle weakness (generalized)  Other symptoms and signs involving cognitive functions following nontraumatic subarachnoid hemorrhage    Problem List Patient Active Problem List   Diagnosis Date Noted  . Dyslipidemia 05/23/2020  . Benign essential HTN 05/06/2020  . Subarachnoid hemorrhage ([place]) 04/30/2020  . Leukopenia 07/15/2018  . Thrombocytopenia ([place]) 07/15/2018  . PCP NOTES >>>>>>> 03/23/2015  . Annual physical exam 03/20/2015  . Bronchospasm 09/06/2014  . DJD (degenerative joint disease) 09/06/2014  . Hypothyroidism 08/28/2014  . Tremor 07/29/2014  . Gait disorder 07/29/2014  . Gout 01/08/2014  . Vitamin D deficiency 11/25/2012  . Melanoma of skin, site unspecified 11/25/2012  . 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 Alcoholism in remission ([place]) 06/19/2011  . Diverticulitis of colon (without mention of hemorrhage)(562.11) 06/19/2011  . Bipolar I disorder ([place]) 06/19/2011  . Osteopenia 05/17/2010  . Alcohol abuse 05/16/2010  . HYPERTRIGLYCERIDEMIA 08/02/2009  . Macrocytic anemia 01/10/2009  . ABNORMAL ELECTROCARDIOGRAM 11/26/2008  . REFLUX, ESOPHAGEAL 11/17/2006  . COMMON MIGRAINE 09/22/2006    RINE,KATHRYN 06/12/2020, 7:48 AM  [facility] [address] [place], [name], [address] Phone: [phone]   Fax:  [phone]  Name: Jeffrey Acosta MRN: [id number] Date of Birth: Sep 02, 1950

## 2020-06-12 NOTE — Telephone Encounter (Signed)
Medication:  folic acid (FOLVITE) 1 MG tablet [[id number]]   Vitamin B1 -100mg      Has the patient contacted their pharmacy?  (If no, request that the patient contact the pharmacy for the refill.) (If yes, when and what did the pharmacy advise?)     Preferred Pharmacy (with phone number or street name): [facility] [place], [address]  [place], [name] [address]  Phone:  [phone] Fax:  [phone]  DEA #:  [id number]     Agent: Please be advised that RX refills may take up to 3 business days. We ask that you follow-up with your pharmacy.

## 2020-06-13 NOTE — Therapy (Signed)
[place] [address] [place], [name], [address] Phone: [phone]   Fax:  [phone]  Physical Therapy Treatment  Patient Details  Name: Jeffrey Acosta MRN: [id number] Date of Birth: 1951-02-07 Referring Provider (PT): Erlinda Hong, Fang/Dr. Kathlene November, PCP   Encounter Date: 06/12/2020   PT End of Session - 06/12/20 0805    Visit Number 5    Number of Visits 18    Date for PT Re-Evaluation 08/18/20    Authorization Type BCBS Medicare-$40 copay per discipline    PT Start Time 0804    PT Stop Time 0845    PT Time Calculation (min) 41 min    Equipment Utilized During Treatment Gait belt    Activity Tolerance Patient tolerated treatment well    Behavior During Therapy [facility] for tasks assessed/performed           Past Medical History:  Diagnosis Date  . Alcoholism in remission ([place])    in [place]  . Arrhythmia    atrial tachycardia  . Bipolar affective disorder ([place])   . Diverticulosis of colon (without mention of hemorrhage)   . GERD (gastroesophageal reflux disease)   . Gout of multiple sites   . Hiatal hernia   . Hx of migraines   . Hydrocele   . HYPERLIPIDEMIA 01/10/2009   Qualifier: Diagnosis of  By: Ronnald Ramp CMA, [place]    NMR Lipoprofile 2011 LDL could not be calculated  Due to TG of 889  (811  / 750 ),  HDL 50 . Father MI @ 47 PGM CVA early 8s; PGF CVA @72    . Hypothyroidism   . Melanoma of skin, site unspecified 11/25/2012   07/2011 abdominal  Stage 1 Dr Amy Martinique Dr Sarajane Jews   . Osteopenia   . Polyarthralgia     Past Surgical History:  Procedure Laterality Date  . BILIARY BRUSHING  12/07/2018   Procedure: BILIARY BRUSHING;  Surgeon: Rush Landmark Telford Nab., MD;  Location: [facility];  Service: Gastroenterology;;  . BILIARY DILATION  12/07/2018   Procedure: BILIARY DILATION;  Surgeon: Irving Copas., MD;  Location: [facility];  Service: Gastroenterology;;  . BIOPSY  12/07/2018   Procedure: BIOPSY;   Surgeon: Irving Copas., MD;  [facility]: [facility];  Service: Gastroenterology;;  . COLONOSCOPY  2003   negative ; Dr Sharlett Iles  . ERCP N/A 12/07/2018   Procedure: ENDOSCOPIC RETROGRADE CHOLANGIOPANCREATOGRAPHY (ERCP);  Surgeon: Irving Copas., MD;  Location: [facility];  Service: Gastroenterology;  Laterality: N/A;  . ESOPHAGOGASTRODUODENOSCOPY (EGD) WITH PROPOFOL N/A 12/07/2018   Procedure: ESOPHAGOGASTRODUODENOSCOPY (EGD) WITH PROPOFOL;  Surgeon: Rush Landmark Telford Nab., MD;  [facility];  Service: Gastroenterology;  Laterality: N/A;  . EUS N/A 12/07/2018   Procedure: UPPER ENDOSCOPIC ULTRASOUND (EUS) RADIAL;  Surgeon: Rush Landmark Telford Nab., MD;  [facility];  Service: Gastroenterology;  Laterality: N/A;  . INGUINAL HERNIA REPAIR  08/07/2011   Procedure: HERNIA REPAIR INGUINAL ADULT;  Surgeon: Rolm Bookbinder, MD;  Location: [facility];  Service: General;  Laterality: Right;  . IR ANGIO INTRA EXTRACRAN SEL INTERNAL CAROTID BILAT MOD SED  05/14/2020  . IR ANGIO VERTEBRAL SEL VERTEBRAL BILAT MOD SED  05/14/2020  . MELANOMA EXCISION  07/28/11   Dr Sarajane Jews; stomach and chest  . REMOVAL OF STONES  12/07/2018   Procedure: REMOVAL OF STONES;  Surgeon: Rush Landmark Telford Nab., MD;  Location: [facility];  Service: Gastroenterology;;  . [name]  1953  . 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 UPPER GASTROINTESTINAL ENDOSCOPY  1983   hiatal hernia  . VASECTOMY  1979    Vitals:   06/12/20 0805 06/12/20 0820  BP: 104/70 107/71  Pulse: (!) 54 68     Subjective Assessment - 06/12/20 0805    Subjective No new complaints. No falls or pain to report. Using cane to enter session today.    Patient is accompained by: Family member   spouse, Jeffrey Acosta   Patient Stated Goals Pt's goals for therapy are to be back to being independent and to get rid of walker.    Currently in Pain? No/denies    Pain Score 0-No pain                   [facility] Adult PT Treatment/Exercise - 06/12/20 0810      Transfers   Transfers Sit to Stand;Stand to Sit    Sit to Stand 5: Supervision;Without upper extremity assist;From chair/3-in-1    Stand to Sit 5: Supervision;Without upper extremity assist;To chair/3-in-1      Ambulation/Gait   Ambulation/Gait Yes    Ambulation/Gait Assistance 5: Supervision;4: Min guard    Ambulation/Gait Assistance Details cues on posture and cane placement at times with gait so pt does not kick it with swing phase.    Ambulation Distance (Feet) 500 Feet   x1, plus around gym with session   Assistive device Straight cane    Gait Pattern Step-through pattern;Narrow base of support    Ambulation Surface Level;Indoor      Neuro Re-ed    Neuro Re-ed Details  for balance/muscle re-ed: gait around track scanning all directions randomly, speed changes fast<>slow, sudden stops/starts. min guard assist for safety with no significant balance issues noted.               Balance Exercises - 06/12/20 0820      Balance Exercises: Standing   SLS with Vectors Foam/compliant surface;Other reps (comment);Limitations    SLS with Vectors Limitations on airex with no UE support with 2 tall cones in front: alternating forward foot taps, cross foot taps, forward double foot taps and cross double foot taps for ~10 reps each. cues on stance position and weight shifting. Min guard to min assist for balance.    Rockerboard Anterior/posterior;Lateral;EO;EC;30 seconds;Other reps (comment);Limitations    Rockerboard Limitations performed both ways on balance board, no UE support with occasional touch to bars- rocking the board with emphasis on tall posture with EO, progressing to EC. Then holding the board steady for EC 30 sec's x 3 reps. no UE support with min guard to min assist for balance. cues on posture and weight shfiting to assist with balance    Balance Beam standing across red foam beam: alternating forward  stepping to the floor/back onto the beam, then alterantign backward stepping to the floor/back onto the beam. ~10 reps each with pt touching the bars as needed for balance at times. min guard to min assist with cues for increased step length/step height to clear the beam surface.               PT Short Term Goals - 05/20/20 1543      PT SHORT TERM GOAL #1   Title Pt will be independent with HEP for improved strength, balance, transfers, and gait.  06/21/2020    Time 5    Period Weeks    Status New      PT SHORT TERM GOAL #2   Title Pt  will improve 5x sit<>stand to less than or equal to 15 sec to demonstrate improved functional strength and transfer efficiency.    Baseline 18.25 sec    Time 5    Period Weeks    Status New      PT SHORT TERM GOAL #3   Title Pt will improve TUG score to less than or equal to 13.5 sec for decreased fall risk.    Baseline 16.72 sec with RW    Time 5    Period Weeks    Status New      PT SHORT TERM GOAL #4   Title Pt will improve Berg score to at least 47/56 for decreased fall risk.    Baseline 40/56    Time 5    Period Weeks    Status New      PT SHORT TERM GOAL #5   Title Pt will ambulate short distances, 50-100 ft with SPC with supervision, for improved transition to more independent household gait.    Time 5    Period Weeks    Status New             PT Long Term Goals - 05/20/20 1546      PT LONG TERM GOAL #1   Title Pt will be independent with progression of HEP for improved strength, balance, transfers, and gait.  TARGET 07/19/2020    Time 9    Period Weeks    Status New      PT LONG TERM GOAL #2   Title Pt will improve 5x sit<>stand to less than or equal to 12.5 sec to demonstrate improved functional strength and transfer efficiency.    Time 9    Period Weeks    Status New      PT LONG TERM GOAL #3   Title Pt will improve gait velocity to at least 2.62 ft/sec for improved gait efficiency and safety.    Time 9    Period  Weeks    Status New      PT LONG TERM GOAL #4   Title Pt will be mod independent with outdoor gait, at least 1000 ft, using cane versus no device, for improved community gait.    Time 9    Period Weeks    Status New      PT LONG TERM GOAL #5   Title Pt will verbalize understanding of fall prevention in home environment.    Time 9    Period Weeks    Status New                 Plan - 06/12/20 0806    Clinical Impression Statement Today's skilled session continued to focus on dynamic gait with cane and balance training on compliant surfaces with no issues noted or reported in session. Pt less impulsive with session today vs with previous sessions.  The pt is progressing toward goals and should benefit from continued PT to progress toward unmet goals    Personal Factors and Comorbidities Comorbidity 3+    Comorbidities See notes    Examination-Activity Limitations Locomotion Level;Transfers;Stand    Examination-Participation Restrictions Shop;Community Activity    Stability/Clinical Decision Making Evolving/Moderate complexity    Rehab Potential Good    PT Frequency 2x / week    PT Duration Other (comment)   for 8 weeks, plus 1 additional visit week of eval   PT Treatment/Interventions ADLs/Self Care Home Management;DME Instruction;Neuromuscular re-education;Balance training;Therapeutic exercise;Therapeutic activities;Functional mobility training;Stair training;Gait training;Patient/family education  PT Next Visit Plan continue with functional strengthening, balance; gait training with cane    PT Home Exercise Plan Access Code: [email or web address]    Consulted and Agree with Plan of Care Patient;Family member/caregiver    Family Member Consulted wife-Jeffrey Acosta           Patient will benefit from skilled therapeutic intervention in order to improve the following deficits and impairments:  Abnormal gait,Difficulty walking,Decreased safety awareness,Decreased balance,Decreased  mobility,Decreased strength  Visit Diagnosis: Muscle weakness (generalized)  Unsteadiness on feet  Other abnormalities of gait and mobility     Problem List Patient Active Problem List   Diagnosis Date Noted  . Dyslipidemia 05/23/2020  . Benign essential HTN 05/06/2020  . Subarachnoid hemorrhage ([place]) 04/30/2020  . Leukopenia 07/15/2018  . Thrombocytopenia ([place]) 07/15/2018  . PCP NOTES >>>>>>> 03/23/2015  . Annual physical exam 03/20/2015  . Bronchospasm 09/06/2014  . DJD (degenerative joint disease) 09/06/2014  . Hypothyroidism 08/28/2014  . Tremor 07/29/2014  . Gait disorder 07/29/2014  . Gout 01/08/2014  . Vitamin D deficiency 11/25/2012  . Melanoma of skin, site unspecified 11/25/2012  . 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 Alcoholism in remission ([place]) 06/19/2011  . Diverticulitis of colon (without mention of hemorrhage)(562.11) 06/19/2011  . Bipolar I disorder ([place]) 06/19/2011  . Osteopenia 05/17/2010  . Alcohol abuse 05/16/2010  . HYPERTRIGLYCERIDEMIA 08/02/2009  . Macrocytic anemia 01/10/2009  . ABNORMAL ELECTROCARDIOGRAM 11/26/2008  . REFLUX, ESOPHAGEAL 11/17/2006  . COMMON MIGRAINE 09/22/2006    Willow Ora, PTA, [place] [address] [place], [place] [address] [phone] 06/13/20, 8:31 AM   Name: KNOLAN SIMIEN MRN: [id number] Date of Birth: June 27, 1950

## 2020-06-20 ENCOUNTER — Ambulatory Visit: Payer: Medicare Other | Admitting: Physical Therapy

## 2020-06-20 ENCOUNTER — Other Ambulatory Visit: Payer: Self-pay

## 2020-06-20 ENCOUNTER — Ambulatory Visit: Payer: Medicare Other

## 2020-06-20 ENCOUNTER — Ambulatory Visit: Payer: Medicare Other | Admitting: Occupational Therapy

## 2020-06-20 ENCOUNTER — Encounter: Payer: Self-pay | Admitting: Physical Therapy

## 2020-06-20 DIAGNOSIS — M6281 Muscle weakness (generalized): Secondary | ICD-10-CM

## 2020-06-20 DIAGNOSIS — R2689 Other abnormalities of gait and mobility: Secondary | ICD-10-CM

## 2020-06-20 DIAGNOSIS — I69018 Other symptoms and signs involving cognitive functions following nontraumatic subarachnoid hemorrhage: Secondary | ICD-10-CM

## 2020-06-20 DIAGNOSIS — R41841 Cognitive communication deficit: Secondary | ICD-10-CM

## 2020-06-20 DIAGNOSIS — R4701 Aphasia: Secondary | ICD-10-CM

## 2020-06-20 DIAGNOSIS — R2681 Unsteadiness on feet: Secondary | ICD-10-CM | POA: Diagnosis not present

## 2020-06-20 NOTE — Patient Instructions (Signed)
   Try pill organizer with [name]'s supervision. Make sure you double check your work!   Let us know how your hobbies are going. Remember to reduce distractions while you are reading (close door, turn down television, go in a quiet room, etc).

## 2020-06-20 NOTE — Therapy (Signed)
[facility] [address] [place], [name], [address] Phone: [phone]   Fax:  [phone]  Occupational Therapy Treatment  Patient Details  Name: Jeffrey Acosta MRN: [id number] Date of Birth: [birth year] Referring Provider (OT): Dr. Florencia Reasons   Encounter Date: 06/20/2020   OT End of Session - 06/20/20 0855    Visit Number 7    Number of Visits 17    Date for OT Re-Evaluation 07/19/20    Authorization Type BCBS Medicare, copay per disc, no auth/VL (cognitive retraining not covered)    Authorization Time Period cert. date 05/21/19-08/18/20    Authorization - Visit Number 7    Authorization - Number of Visits 10    OT Start Time [phone]    OT Stop Time 0930    OT Time Calculation (min) 40 min    Activity Tolerance Patient tolerated treatment well    Behavior During Therapy [facility] for tasks assessed/performed           Past Medical History:  Diagnosis Date  . Alcoholism in remission ([place])    in [place]  . Arrhythmia    atrial tachycardia  . Bipolar affective disorder ([place])   . Diverticulosis of colon (without mention of hemorrhage)   . GERD (gastroesophageal reflux disease)   . Gout of multiple sites   . Hiatal hernia   . Hx of migraines   . Hydrocele   . HYPERLIPIDEMIA 01/10/2009   Qualifier: Diagnosis of  By: Ronnald Ramp CMA, [place]    NMR Lipoprofile 2011 LDL could not be calculated  Due to TG of 889  (811  / 750 ),  HDL 50 . Father MI @ 91 PGM CVA early 31s; PGF CVA '@72'    . Hypothyroidism   . Melanoma of skin, site unspecified 11/25/2012   07/2011 abdominal  Stage 1 Dr Amy [place] Dr Sarajane Jews   . Osteopenia   . Polyarthralgia     Past Surgical History:  Procedure Laterality Date  . BILIARY BRUSHING  12/07/2018   Procedure: BILIARY BRUSHING;  Surgeon: Rush Landmark Telford Nab., MD;  Location: [facility];  Service: Gastroenterology;;  . BILIARY DILATION  12/07/2018   Procedure: BILIARY DILATION;  Surgeon: Irving Copas., MD;  Location: [facility];  Service: Gastroenterology;;  . BIOPSY  12/07/2018   Procedure: BIOPSY;  Surgeon: Irving Copas., MD;  [facility];  Service: Gastroenterology;;  . COLONOSCOPY  2003   negative ; Dr Sharlett Iles  . ERCP N/A 12/07/2018   Procedure: ENDOSCOPIC RETROGRADE CHOLANGIOPANCREATOGRAPHY (ERCP);  Surgeon: Irving Copas., MD;  Location: [facility];  Service: Gastroenterology;  Laterality: N/A;  . ESOPHAGOGASTRODUODENOSCOPY (EGD) WITH PROPOFOL N/A 12/07/2018   Procedure: ESOPHAGOGASTRODUODENOSCOPY (EGD) WITH PROPOFOL;  Surgeon: Rush Landmark Telford Nab., MD;  [facility];  Service: Gastroenterology;  Laterality: N/A;  . EUS N/A 12/07/2018   Procedure: UPPER ENDOSCOPIC ULTRASOUND (EUS) RADIAL;  Surgeon: Rush Landmark Telford Nab., MD;  [facility];  Service: Gastroenterology;  Laterality: N/A;  . INGUINAL HERNIA REPAIR  08/07/2011   Procedure: HERNIA REPAIR INGUINAL ADULT;  Surgeon: Rolm Bookbinder, MD;  Location: [facility];  Service: General;  Laterality: Right;  . IR ANGIO INTRA EXTRACRAN SEL INTERNAL CAROTID BILAT MOD SED  05/14/2020  . IR ANGIO VERTEBRAL SEL VERTEBRAL BILAT MOD SED  05/14/2020  . MELANOMA EXCISION  07/28/11   Dr Sarajane Jews; stomach and chest  . REMOVAL OF STONES  12/07/2018   Procedure: REMOVAL OF STONES;  Surgeon: Rush Landmark Telford Nab., MD;  Location: [facility];  Service: Gastroenterology;;  . [place]  . 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 UPPER GASTROINTESTINAL ENDOSCOPY  1983   hiatal hernia  . VASECTOMY  1979    There were no vitals filed for this visit.   Subjective Assessment - 06/20/20 0853    Subjective  Pt reports that he continues to do more at home    Patient is accompanied by: Family member   wife   Pertinent History subarachnoid hemorrhage (CT 04/30/20 showed moderate to large acute SAH; 05/11/20 MRI brain showed small acute infarcts in L basal ganglia,  L periatrial temporal lobe and high bilateral frontoparietal regions).  PMH:  hx of alcohol use with intoxication on hospital presentation, hypothyroidism, atrial tachycardia, Barrett's esophagus, hx of atrial tachycardia, osteopenia, thrombocytopenia, DJD, gout, melanoma of skin, bipolar disorder    Patient Stated Goals return to previous activities    Currently in Pain? No/denies             Complex Weekly Scheduling Activity for problem solving and planning:  Pt with 1 error (not allowing enough time to complete 1 activity before scheduling another).  Deductive Reasoning Puzzle (different modes of transportation and city):  Pt needed mod-max cueing for problem solving/deductive reasoning.          OT Education - 06/20/20 0929    Education Details Updated [place] HEP to red--pt returned demo each x20    Person(s) Educated Patient;Spouse    Methods Explanation;Demonstration;Verbal cues    Comprehension Verbalized understanding;Returned demonstration            OT Short Term Goals - 06/12/20 0746      OT SHORT TERM GOAL #1   Title Pt will be independent with HEP for bilateral shoulder strength.--check STGs 06/20/20    Time 4    Period Weeks    Status On-going      OT SHORT TERM GOAL #2   Title Pt will be able to perform shower transfer with supervision.    Time 4    Period Weeks    Status Achieved   06/10/20:  met at this level     OT [place] #3   Title Pt will be able to perform simple alternating attention with at least 90% accuracy for incr safety.    Time 4    Period Weeks    Status On-going      OT SHORT TERM GOAL #4   Title Pt will perform mod complex functional problem solving with at least 90% accuracy.    Time 4    Period Weeks    Status On-going      OT SHORT TERM GOAL #5   Title Pt will verbalize understanding of cognitive compensation strategies for ADLs/IADLs.    Time 4    Period Weeks    Status On-going             OT Long Term  Goals - 05/20/20 0925      OT LONG TERM GOAL #1   Title Pt will able to reach at least 10" bilaterally on standing functional reach test without cueing/impulsivity for improved safety for IADLs.    Time 8    Period Weeks    Status New      OT LONG TERM GOAL #2   Title Pt will demo good safety awareness for retrieving items from low shelf/floor mod I/without cueing.    Time 8  Period Weeks    Status New      OT LONG TERM GOAL #3   Title Pt will perform simple prior cooking/home maintenance tasks mod I and demonstrating good safety awareness/problem solving.    Time 8    Period Weeks    Status New      OT LONG TERM GOAL #4   Title Pt will perform simple divided attention with at least 1 physical and 1 cognitive tasks for improved safety.    Time 8    Period Weeks    Status New      OT LONG TERM GOAL #5   Title Pt will perform shower transfer mod I.    Time 8    Period Weeks    Status New                 Plan - 06/20/20 1324    Clinical Impression Statement Pt is progressing towards goals with improving IADL performance at home.    OT Occupational Profile and History Detailed Assessment- Review of Records and additional review of physical, cognitive, psychosocial history related to current functional performance    Occupational performance deficits (Please refer to evaluation for details): ADL's;IADL's    Body Structure / Function / Physical Skills ADL;Strength;Balance;IADL;Endurance;Mobility;Coordination;Decreased knowledge of precautions    Cognitive Skills Problem Solve;Safety Awareness;Attention;Memory;Sequencing    Rehab Potential Good    Clinical Decision Making Several treatment options, min-mod task modification necessary    Comorbidities Affecting Occupational Performance: May have comorbidities impacting occupational performance    Modification or Assistance to Complete Evaluation  Min-Moderate modification of tasks or assist with assess necessary to  complete eval    OT Frequency 2x / week    OT Duration 8 weeks   +eval or 17 total visits   OT Treatment/Interventions Self-care/ADL training;DME and/or AE instruction;Balance training;Therapeutic activities;Cognitive remediation/compensation;Therapeutic exercise;Neuromuscular education;Functional Mobility Training;Patient/family education;Manual Therapy;Passive range of motion;Energy conservation;Cryotherapy;Moist Heat    Plan continue to address functional cognition and safety with mobility due to impiulsivity, begin checking STGs    Consulted and Agree with Plan of Care Patient;Family member/caregiver    Family Member Consulted wife           Patient will benefit from skilled therapeutic intervention in order to improve the following deficits and impairments:   Body Structure / Function / Physical Skills: ADL,Strength,Balance,IADL,Endurance,Mobility,Coordination,Decreased knowledge of precautions Cognitive Skills: Problem Solve,Safety Awareness,Attention,Memory,Sequencing     Visit Diagnosis: Other symptoms and signs involving cognitive functions following nontraumatic subarachnoid hemorrhage  Muscle weakness (generalized)  Unsteadiness on feet    Problem List Patient Active Problem List   Diagnosis Date Noted  . Dyslipidemia 05/23/2020  . Benign essential HTN 05/06/2020  . Subarachnoid hemorrhage ([place]) 04/30/2020  . Leukopenia 07/15/2018  . Thrombocytopenia ([place]) 07/15/2018  . PCP NOTES >>>>>>> 03/23/2015  . Annual physical exam 03/20/2015  . Bronchospasm 09/06/2014  . DJD (degenerative joint disease) 09/06/2014  . Hypothyroidism 08/28/2014  . Tremor 07/29/2014  . Gait disorder 07/29/2014  . Gout 01/08/2014  . Vitamin D deficiency 11/25/2012  . Melanoma of skin, site unspecified 11/25/2012  . 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 Alcoholism in remission ([place]) 06/19/2011  . Diverticulitis of colon (without mention of hemorrhage)(562.11) 06/19/2011  .  Bipolar I disorder ([place]) 06/19/2011  . Osteopenia 05/17/2010  . Alcohol abuse 05/16/2010  . HYPERTRIGLYCERIDEMIA 08/02/2009  . Macrocytic anemia 01/10/2009  . ABNORMAL ELECTROCARDIOGRAM 11/26/2008  . REFLUX, ESOPHAGEAL 11/17/2006  . COMMON MIGRAINE 09/22/2006    [facility] 06/20/2020,  9:46 AM  [facility] [address] [place], [name], [address] Phone: [phone]   Fax:  [phone]  Name: Jeffrey Acosta MRN: [id number] Date of Birth: 1950-07-10   Vianne Bulls, OTR/L [facility] [address] [place], [place]  [address] [phone] phone [phone] 06/20/20 9:46 AM

## 2020-06-20 NOTE — Therapy (Signed)
[facility] [address] [place], [name], [address] Phone: [phone]   Fax:  [phone]  Speech Language Pathology Treatment  Patient Details  Name: Jeffrey Acosta MRN: [id number] Date of Birth: 12/18/1950 Referring Provider (SLP): Jeffrey Acosta (referring); Jeffrey Perla, MD (doc)   Encounter Date: 06/20/2020   End of Session - 06/20/20 0851    Visit Number 6    Number of Visits 17    Date for SLP Re-Evaluation 08/16/20    SLP Start Time 0800    SLP Stop Time  0843    SLP Time Calculation (min) 43 min    Activity Tolerance Patient tolerated treatment well           Past Medical History:  Diagnosis Date  . Alcoholism in remission ([place])    in [place]  . Arrhythmia    atrial tachycardia  . Bipolar affective disorder ([place])   . Diverticulosis of colon (without mention of hemorrhage)   . GERD (gastroesophageal reflux disease)   . Gout of multiple sites   . Hiatal hernia   . Hx of migraines   . Hydrocele   . HYPERLIPIDEMIA 01/10/2009   Qualifier: Diagnosis of  By: Jeffrey Acosta, Jeffrey Acosta    NMR Lipoprofile 2011 LDL could not be calculated  Due to TG of 889  (811  / 750 ),  HDL 50 . Father MI @ 58 PGM CVA early 55s; PGF CVA '@72'    . Hypothyroidism   . Melanoma of skin, site unspecified 11/25/2012   07/2011 abdominal  Stage 1 Dr Jeffrey Acosta Dr Jeffrey Acosta   . Osteopenia   . Polyarthralgia     Past Surgical History:  Procedure Laterality Date  . BILIARY BRUSHING  12/07/2018   Procedure: BILIARY BRUSHING;  Surgeon: Jeffrey Acosta Jeffrey Nab., MD;  Location: [facility];  Service: Gastroenterology;;  . BILIARY DILATION  12/07/2018   Procedure: BILIARY DILATION;  Surgeon: Jeffrey Acosta., MD;  Location: [facility];  Service: Gastroenterology;;  . BIOPSY  12/07/2018   Procedure: BIOPSY;  Surgeon: Jeffrey Acosta., MD;  Location: [facility];  Service: Gastroenterology;;  . COLONOSCOPY  2003   negative ; Dr Jeffrey Acosta   . ERCP N/A 12/07/2018   Procedure: ENDOSCOPIC RETROGRADE CHOLANGIOPANCREATOGRAPHY (ERCP);  Surgeon: Jeffrey Acosta., MD;  Location: [facility];  Service: Gastroenterology;  Laterality: N/A;  . ESOPHAGOGASTRODUODENOSCOPY (EGD) WITH PROPOFOL N/A 12/07/2018   Procedure: ESOPHAGOGASTRODUODENOSCOPY (EGD) WITH PROPOFOL;  Surgeon: Jeffrey Acosta Jeffrey Nab., MD;  Location: [facility];  Service: Gastroenterology;  Laterality: N/A;  . EUS N/A 12/07/2018   Procedure: UPPER ENDOSCOPIC ULTRASOUND (EUS) RADIAL;  Surgeon: Jeffrey Acosta Jeffrey Nab., MD;  Location: [facility];  Service: Gastroenterology;  Laterality: N/A;  . INGUINAL HERNIA REPAIR  08/07/2011   Procedure: HERNIA REPAIR INGUINAL ADULT;  Surgeon: Jeffrey Bookbinder, MD;  Location: [facility];  Service: General;  Laterality: Right;  . IR ANGIO INTRA EXTRACRAN SEL INTERNAL CAROTID BILAT MOD SED  05/14/2020  . IR ANGIO VERTEBRAL SEL VERTEBRAL BILAT MOD SED  05/14/2020  . MELANOMA EXCISION  07/28/11   Dr Jeffrey Acosta; stomach and chest  . REMOVAL OF STONES  12/07/2018   Procedure: REMOVAL OF STONES;  Surgeon: Jeffrey Acosta Jeffrey Nab., MD;  Location: [facility];  Service: Gastroenterology;;  . Jeffrey Acosta  1953  . 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 UPPER GASTROINTESTINAL ENDOSCOPY  1983   hiatal hernia  . VASECTOMY  1979    There were no vitals filed  for this visit.   Subjective Assessment - 06/20/20 0844    Subjective "things are feeling more normal and natural"    Patient is accompained by: Family member   Jeffrey Acosta-wife   Currently in Pain? No/denies    Pain Score 0-No pain                 ADULT SLP TREATMENT - 06/20/20 0844      General Information   Behavior/Cognition Alert;Cooperative;Pleasant mood;Hard of hearing;Requires cueing      Treatment Provided   Treatment provided Cognitive-Linquistic      Cognitive-Linquistic Treatment   Treatment focused on Aphasia;Cognition;Patient/family/caregiver  education    Skilled Treatment Jeffrey Acosta reports significant improvements in aphasia and cognition since fall. Given percentage scale of 1-100% back to normal, pt rated himself ~90% back to normal. Jeffrey Acosta and his wife, Jeffrey Acosta, denied any conerns related to congitive linguistic skills, with pt nearing return to PLOF. Rare word finding reported at home, which pt able to self-correct within seconds. No overt word finding epsiodes demonstrated this session. SLP reviewed daily routine with pt independently making simple meals and completing recommended PT/OT exercises. Pt verbally recalled 1/2 HWK activites, which pt ID'd 2 appropriate hobbies (reading a book within 6 wks and walking 2 miles in neighborhood with wife). Good insight and awareness of current level of functioning exhibited and endorsed by wife. SLP educated patient on environmental modifications for successful task completion due to inattention reported while reading book, if distractions present. SLP challenged patient and wife to complete pill box (with supervision at this time) for further independence. If pill organizer is successful, pt may be nearing goal attainment for possible ST discharge.      Assessment / Recommendations / Plan   Plan Continue with current plan of care      Progression Toward Goals   Progression toward goals Progressing toward goals            SLP Education - 06/20/20 0851    Education Details pill organizer, environmental modifications, safety awareness    Person(s) Educated Patient;Spouse    Methods Explanation;Demonstration;Handout    Comprehension Verbalized understanding;Returned demonstration;Need further instruction            SLP Short Term Goals - 06/20/20 1937      SLP SHORT TERM GOAL #1   Title Pt will engage with appropriate topic changes in 10 minutes min-mod complex conversation in 3 sessions    Baseline 06-20-20    Time 1    Period Weeks    Status On-going      SLP SHORT TERM GOAL #2   Title  pt will be able to verbalize three non-physical deficits by giving examples from home or within the ST session, in 2 sessions    Baseline 06-12-20, 06-20-20    Status Achieved      SLP SHORT TERM GOAL #3   Title pt will tell SLP his medication schedule independently, with modified independence    Time 1    Period Weeks    Status On-going      SLP SHORT TERM GOAL #4   Title pt will complete cognitive lingiustic and aphasia evaluations PRN    Status Achieved      SLP SHORT TERM GOAL #5   Title pt will engage in mod complex conversation for 15 minutes with compensations in 3 sessions    Baseline 06-20-20    Time 1    Period Weeks    Status On-going  SLP Long Term Goals - 06/20/20 0859      SLP LONG TERM GOAL #1   Title pt will perform written aphasia tasks and answer "wh" questions using aphasia compensations PRN, in 3 sessions    Time 5    Period Weeks   or 17 sessions, for all LTGs   Status Partially Met      SLP LONG TERM GOAL #2   Title pt will ensure accurate written responses using compensatory measures for therapy work 100% of the time    Time 5    Period Weeks    Status On-going      SLP LONG TERM GOAL #3   Title pt will write information functionally (spelling, word choice, etc)  in his memory book between 3 sessions    Time 5    Period Weeks    Status On-going      SLP LONG TERM GOAL #4   Title pt will engage in 15 minutes mod complex/complex conversation successfully using compensation strategies in 3 sessions    Baseline 06-20-20    Time 5    Period Weeks    Status On-going            Plan - 06/20/20 0932    Clinical Impression Statement Yazen and his wife report significant improvements in anomia and cognitive linguisitic skills, with slighlty increased processing time required compared to baseline. Increased independence reported at home, with pt deeming himself ~90% back to normal. Wife confirmed pt is nearing return to baseline with no  concerns endorsed. SLP reviewed completing pill organizer with wife's superivison with future intent to complete independently with no mistakes. Pt able to verbalize recommended PT/OT exercises, current daily routine, recent events, and safety precautions related to home environment and current deficits. No anomic episodes demonstrated this session. Pt would continue to benefit from skilled ST services to maximize cognitive linguistic skills as pt is nearing return to PLOF.    Speech Therapy Frequency 2x / week    Duration 8 weeks    Treatment/Interventions Environmental controls;Language facilitation;Cueing hierarchy;Cognitive reorganization;Internal/external aids;Patient/family education;SLP instruction and feedback;Functional tasks    Potential to Achieve Goals Good    SLP Home Exercise Plan environmental modifications, pill organizer    Consulted and Agree with Plan of Care Patient;Family member/caregiver           Patient will benefit from skilled therapeutic intervention in order to improve the following deficits and impairments:   Aphasia  Cognitive communication deficit    Problem List Patient Active Problem List   Diagnosis Date Noted  . Dyslipidemia 05/23/2020  . Benign essential HTN 05/06/2020  . Subarachnoid hemorrhage ([place]) 04/30/2020  . Leukopenia 07/15/2018  . Thrombocytopenia ([place]) 07/15/2018  . PCP NOTES >>>>>>> 03/23/2015  . Annual physical exam 03/20/2015  . Bronchospasm 09/06/2014  . DJD (degenerative joint disease) 09/06/2014  . Hypothyroidism 08/28/2014  . Tremor 07/29/2014  . Gait disorder 07/29/2014  . Gout 01/08/2014  . Vitamin D deficiency 11/25/2012  . Melanoma of skin, site unspecified 11/25/2012  . 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 Alcoholism in remission ([place]) 06/19/2011  . Diverticulitis of colon (without mention of hemorrhage)(562.11) 06/19/2011  . Bipolar I disorder ([place]) 06/19/2011  . Osteopenia 05/17/2010  . Alcohol abuse  05/16/2010  . HYPERTRIGLYCERIDEMIA 08/02/2009  . Macrocytic anemia 01/10/2009  . ABNORMAL ELECTROCARDIOGRAM 11/26/2008  . REFLUX, ESOPHAGEAL 11/17/2006  . COMMON MIGRAINE 09/22/2006    Alinda Deem, MA CCC-SLP 06/20/2020, 9:01 AM  [facility] [address] [place], [name], [address] Phone: [phone]   Fax:  [phone]   Name: ATHENS LEBEAU MRN: [id number] Date of Birth: [birth year]

## 2020-06-20 NOTE — Patient Instructions (Addendum)
Access Code: [email or web address] URL: [email or web address] Date: 06/20/2020 Prepared by: Willow Ora  Exercises Walking March - 1-2 x daily - 5 x weekly - 2 sets - 4 reps - 2 hold Tandem Walking with Counter Support - 1 x daily - 5 x weekly - 1 sets - 3 reps  Stopped with with revisions. Standing Single Leg Stance with Counter Support - 1-2 x daily - 5 x weekly - 1-2 sets - 4 reps - 15 hold Heel Walking with Counter Support - 1 x daily - 5 x weekly - 1 sets - 4 reps Toe Walking with Counter Support - 1 x daily - 5 x weekly - 1 sets - 4 reps Romberg Stance Eyes Closed on Foam Pad - 1 x daily - 5 x weekly - 1 sets - 3 reps - 30 hold Wide Stance with Head Rotation on Foam Pad - 1 x daily - 5 x weekly - 1 sets - 10 reps

## 2020-06-21 NOTE — Therapy (Signed)
[facility] [address] [place], [name], [address] Phone: [phone]   Fax:  [phone]  Physical Therapy Treatment  Patient Details  Name: Jeffrey Acosta MRN: [id number] Date of Birth: October 16, 1950 Referring Provider (PT): Erlinda Hong, Fang/Dr. Kathlene November, PCP   Encounter Date: 06/20/2020   PT End of Session - 06/20/20 0935    Visit Number 6    Number of Visits 18    Date for PT Re-Evaluation 08/18/20    Authorization Type [employer] Medicare-$40 copay per discipline    PT Start Time 0933    PT Stop Time 1015    PT Time Calculation (min) 42 min    Equipment Utilized During Treatment Gait belt    Activity Tolerance Patient tolerated treatment well    Behavior During Therapy [facility] for tasks assessed/performed           Past Medical History:  Diagnosis Date  . Alcoholism in remission ([place])    in [place]  . Arrhythmia    atrial tachycardia  . Bipolar affective disorder ([place])   . Diverticulosis of colon (without mention of hemorrhage)   . GERD (gastroesophageal reflux disease)   . Gout of multiple sites   . Hiatal hernia   . Hx of migraines   . Hydrocele   . HYPERLIPIDEMIA 01/10/2009   Qualifier: Diagnosis of  By: Ronnald Ramp CMA, [place]    NMR Lipoprofile 2011 LDL could not be calculated  Due to TG of 889  (811  / 750 ),  HDL 50 . Father MI @ 58 PGM CVA early 75s; PGF CVA _0    . Hypothyroidism   . Melanoma of skin, site unspecified 11/25/2012   07/2011 abdominal  Stage 1 Dr Amy [place] Dr Sarajane Jews   . Osteopenia   . Polyarthralgia     Past Surgical History:  Procedure Laterality Date  . BILIARY BRUSHING  12/07/2018   Procedure: BILIARY BRUSHING;  Surgeon: Rush Landmark Telford Nab., MD;  Location: [facility];  Service: Gastroenterology;;  . BILIARY DILATION  12/07/2018   Procedure: BILIARY DILATION;  Surgeon: Irving Copas., MD;  Location: [facility];  Service: Gastroenterology;;  . BIOPSY  12/07/2018   Procedure: BIOPSY;   Surgeon: Irving Copas., MD;  Location: [facility];  Service: Gastroenterology;;  . COLONOSCOPY  2003   negative ; Dr Sharlett Iles  . ERCP N/A 12/07/2018   Procedure: ENDOSCOPIC RETROGRADE CHOLANGIOPANCREATOGRAPHY (ERCP);  Surgeon: Irving Copas., MD;  Location: [facility];  Service: Gastroenterology;  Laterality: N/A;  . ESOPHAGOGASTRODUODENOSCOPY (EGD) WITH PROPOFOL N/A 12/07/2018   Procedure: ESOPHAGOGASTRODUODENOSCOPY (EGD) WITH PROPOFOL;  Surgeon: Rush Landmark Telford Nab., MD;  Location: [facility];  Service: Gastroenterology;  Laterality: N/A;  . EUS N/A 12/07/2018   Procedure: UPPER ENDOSCOPIC ULTRASOUND (EUS) RADIAL;  Surgeon: Rush Landmark Telford Nab., MD;  Location: [facility];  Service: Gastroenterology;  Laterality: N/A;  . INGUINAL HERNIA REPAIR  08/07/2011   Procedure: HERNIA REPAIR INGUINAL ADULT;  Surgeon: Rolm Bookbinder, MD;  Location: [facility];  Service: General;  Laterality: Right;  . IR ANGIO INTRA EXTRACRAN SEL INTERNAL CAROTID BILAT MOD SED  05/14/2020  . IR ANGIO VERTEBRAL SEL VERTEBRAL BILAT MOD SED  05/14/2020  . MELANOMA EXCISION  07/28/11   Dr Sarajane Jews; stomach and chest  . REMOVAL OF STONES  12/07/2018   Procedure: REMOVAL OF STONES;  Surgeon: Rush Landmark Telford Nab., MD;  Location: [facility];  Service: Gastroenterology;;  . [name]  1953  . 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 UPPER GASTROINTESTINAL ENDOSCOPY  1983   hiatal hernia  . VASECTOMY  1979    There were no vitals filed for this visit.   Subjective Assessment - 06/20/20 0934    Subjective No new complaints. No falls or pain to report. No more episodes of lightheadedness or dizziness. Using no device today.    Patient is accompained by: Family member   spouse [name]   Patient Stated Goals Pt's goals for therapy are to be back to being independent and to get rid of walker.    Currently in Pain? No/denies              [facility] PT Assessment -  06/20/20 0937      Berg Balance Test   Sit to Stand Able to stand without using hands and stabilize independently    Standing Unsupported Able to stand safely 2 minutes    Sitting with Back Unsupported but Feet Supported on Floor or Stool Able to sit safely and securely 2 minutes    Stand to Sit Sits safely with minimal use of hands    Transfers Able to transfer safely, minor use of hands    Standing Unsupported with Eyes Closed Able to stand 10 seconds safely    Standing Unsupported with Feet Together Able to place feet together independently and stand 1 minute safely    From Standing, Reach Forward with Outstretched Arm Can reach confidently >25 cm (10")    From Standing Position, Pick up Object from Floor Able to pick up shoe safely and easily    From Standing Position, Turn to Look Behind Over each Shoulder Looks behind from both sides and weight shifts well    Turn 360 Degrees Able to turn 360 degrees safely in 4 seconds or less   both ways   Standing Unsupported, Alternately Place Feet on Step/Stool Able to stand independently and safely and complete 8 steps in 20 seconds   11.68 sec's   Standing Unsupported, One Foot in Front Able to plae foot ahead of the other independently and hold 30 seconds    Standing on One Leg Able to lift leg independently and hold equal to or more than 3 seconds   left stance   Total Score 53    Berg comment: 52-55 lower risk for falls      Timed Up and Go Test   TUG Normal TUG    Normal TUG (seconds) 8.91   no device     Functional Gait  Assessment   Gait assessed  Yes    Gait Level Surface Walks 20 ft in less than 7 sec but greater than 5.5 sec, uses assistive device, slower speed, mild gait deviations, or deviates 6-10 in outside of the 12 in walkway width.   5.87 sec's   Change in Gait Speed Able to smoothly change walking speed without loss of balance or gait deviation. Deviate no more than 6 in outside of the 12 in walkway width.    Gait with  Horizontal Head Turns Performs head turns smoothly with slight change in gait velocity (eg, minor disruption to smooth gait path), deviates 6-10 in outside 12 in walkway width, or uses an assistive device.    Gait with Vertical Head Turns Performs task with slight change in gait velocity (eg, minor disruption to smooth gait path), deviates 6 - 10 in outside 12 in walkway width or uses assistive device    Gait and Pivot Turn Pivot turns safely  within 3 sec and stops quickly with no loss of balance.    Step Over Obstacle Is able to step over one shoe box (4.5 in total height) without changing gait speed. No evidence of imbalance.    Gait with Narrow Base of Support Is able to ambulate for 10 steps heel to toe with no staggering.    Gait with Eyes Closed Walks 20 ft, slow speed, abnormal gait pattern, evidence for imbalance, deviates 10-15 in outside 12 in walkway width. Requires more than 9 sec to ambulate 20 ft.   >9 sec's   Ambulating Backwards Walks 20 ft, uses assistive device, slower speed, mild gait deviations, deviates 6-10 in outside 12 in walkway width.   decr speed, ~20 sec's   Steps Alternating feet, must use rail.   min guard to min assist for safety with descent with no rail   Total Score 22                [facility] Adult PT Treatment/Exercise - 06/20/20 0937      Transfers   Transfers Sit to Stand;Stand to Sit    Sit to Stand 6: Modified independent (Device/Increase time)    Five time sit to stand comments  11.96 sec's no UE support from standard height surface    Stand to Sit 6: Modified independent (Device/Increase time)      Ambulation/Gait   Ambulation/Gait Yes    Ambulation/Gait Assistance 5: Supervision    Ambulation/Gait Assistance Details cues for posture and arm swing    Ambulation Distance (Feet) 210 Feet   x1, plus around gym wtih session   Assistive device None    Gait Pattern Step-through pattern;Narrow base of support    Ambulation Surface Level;Indoor    Gait  velocity 8.97 sec's= 3.66 ft/sec no AD      Exercises   Exercises Other Exercises    Other Exercises  began to review and revise HEP. Will need to complete this at next session as ran out of time today. Refer to [place] for details.           Access Code: [email or web address] URL: [email or web address] Date: 06/20/2020 Prepared by: Willow Ora  Exercises Walking March - 1-2 x daily - 5 x weekly - 2 sets - 4 reps - 2 hold Tandem Walking with Counter Support - 1 x daily - 5 x weekly - 1 sets - 3 reps  Stopped with with revisions. Standing Single Leg Stance with Counter Support - 1-2 x daily - 5 x weekly - 1-2 sets - 4 reps - 15 hold Heel Walking with Counter Support - 1 x daily - 5 x weekly - 1 sets - 4 reps Toe Walking with Counter Support - 1 x daily - 5 x weekly - 1 sets - 4 reps Romberg Stance Eyes Closed on Foam Pad - 1 x daily - 5 x weekly - 1 sets - 3 reps - 30 hold Wide Stance with Head Rotation on Foam Pad - 1 x daily - 5 x weekly - 1 sets - 10 reps   PT Education - 06/20/20 1700    Education Details progress toward goals; results of FGA; began to revise HEP    Person(s) Educated Patient;Spouse    Methods Explanation;Demonstration;Verbal cues;Handout    Comprehension Verbalized understanding;Returned demonstration;Verbal cues required;Need further instruction            PT Short Term Goals - 06/20/20 0935      PT SHORT TERM GOAL #1  Title Pt will be independent with HEP for improved strength, balance, transfers, and gait.  06/21/2020    Baseline 06/20/20: met with current program. needs to be updated as current progam is easy per pt report    Time --    Period --    Status Achieved      PT SHORT TERM GOAL #2   Title Pt will improve 5x sit<>stand to less than or equal to 15 sec to demonstrate improved functional strength and transfer efficiency.    Baseline 06/20/20: 11.96 sec's no UE support form standard height surface    Time --    Period --    Status  Achieved      PT SHORT TERM GOAL #3   Title Pt will improve TUG score to less than or equal to 13.5 sec for decreased fall risk.    Baseline 06/20/20; 8.91 sec's no device    Time --    Period --    Status Achieved      PT SHORT TERM GOAL #4   Title Pt will improve Berg score to at least 47/56 for decreased fall risk.    Baseline 06/20/20: 53/56 scored today    Time --    Period --    Status Achieved      PT SHORT TERM GOAL #5   Title Pt will ambulate short distances, 50-100 ft with SPC with supervision, for improved transition to more independent household gait.    Baseline 06/20/20: met with no device    Time --    Period --    Status Achieved             PT Long Term Goals - 06/21/20 0912      PT LONG TERM GOAL #1   Title Pt will be independent with progression of HEP for improved strength, balance, transfers, and gait.  TARGET 07/19/2020    Time 9    Period Weeks    Status On-going      PT LONG TERM GOAL #2   Title Pt will improve 5x sit<>stand to less than or equal to 12.5 sec to demonstrate improved functional strength and transfer efficiency.    Baseline 06/20/20: 11.96 sec's no UE support from standard height surface    Status Achieved      PT LONG TERM GOAL #3   Title Pt will improve gait velocity to at least 2.62 ft/sec for improved gait efficiency and safety.    Baseline 06/20/20: 3.66 ft/sec no AD    Status Achieved      PT LONG TERM GOAL #4   Title Pt will be mod independent with outdoor gait, at least 1000 ft, using cane versus no device, for improved community gait.    Time 9    Period Weeks    Status On-going      PT LONG TERM GOAL #5   Title Pt will verbalize understanding of fall prevention in home environment.    Time 9    Period Weeks    Status On-going                 Plan - 06/20/20 0935    Clinical Impression Statement Today's skilled session focused on progress toward STGs. Pt has met all STGs. Pt also met his LTGs for 5 time sit  to stand with time of 11.96 sec's with no UE support from a standard height surface and his gait speed goal with time of 3.66 ft/sec no AD.  Due to pt scoring 53/56 on the Berg Balance Test a Functional Gait Assement was done today with a score of 22/30 placing pt in the medium fall risk category. Remainder of session began to revise pt's HEP.  Due to time contraints the first 2 were the only ones revised. Will plan to revise remainder of program at next session. The pt is making great progress and should benefit from continued PT to progress toward unmet goals.    Personal Factors and Comorbidities Comorbidity 3+    Comorbidities See notes    Examination-Activity Limitations Locomotion Level;Transfers;Stand    Examination-Participation Restrictions Shop;Community Activity    Stability/Clinical Decision Making Evolving/Moderate complexity    Rehab Potential Good    PT Frequency 2x / week    PT Duration Other (comment)   for 8 weeks, plus 1 additional visit week of eval   PT Treatment/Interventions ADLs/Self Care Home Management;DME Instruction;Neuromuscular re-education;Balance training;Therapeutic exercise;Therapeutic activities;Functional mobility training;Stair training;Gait training;Patient/family education    PT Next Visit Plan revise remainder of HEP; high level balance/dynamic gait with no AD, balance on compliant surfaces    PT Home Exercise Plan Access Code: [email or web address]    Consulted and Agree with Plan of Care Patient;Family member/caregiver    Family Member Consulted wife-[name]           Patient will benefit from skilled therapeutic intervention in order to improve the following deficits and impairments:  Abnormal gait,Difficulty walking,Decreased safety awareness,Decreased balance,Decreased mobility,Decreased strength  Visit Diagnosis: Muscle weakness (generalized)  Unsteadiness on feet  Other abnormalities of gait and mobility     Problem List Patient Active Problem List    Diagnosis Date Noted  . Dyslipidemia 05/23/2020  . Benign essential HTN 05/06/2020  . Subarachnoid hemorrhage ([place]) 04/30/2020  . Leukopenia 07/15/2018  . Thrombocytopenia ([place]) 07/15/2018  . PCP NOTES >>>>>>> 03/23/2015  . Annual physical exam 03/20/2015  . Bronchospasm 09/06/2014  . DJD (degenerative joint disease) 09/06/2014  . Hypothyroidism 08/28/2014  . Tremor 07/29/2014  . Gait disorder 07/29/2014  . Gout 01/08/2014  . Vitamin D deficiency 11/25/2012  . Melanoma of skin, site unspecified 11/25/2012  . 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 Alcoholism in remission ([place]) 06/19/2011  . Diverticulitis of colon (without mention of hemorrhage)(562.11) 06/19/2011  . Bipolar I disorder ([place]) 06/19/2011  . Osteopenia 05/17/2010  . Alcohol abuse 05/16/2010  . HYPERTRIGLYCERIDEMIA 08/02/2009  . Macrocytic anemia 01/10/2009  . ABNORMAL ELECTROCARDIOGRAM 11/26/2008  . REFLUX, ESOPHAGEAL 11/17/2006  . COMMON MIGRAINE 09/22/2006    Willow Ora, PTA, [facility] [address], [place], [place] [address] [phone] 06/21/20, 12:40 PM   Name: CAP MASSI MRN: [id number] Date of Birth: 17-Feb-1951

## 2020-06-24 ENCOUNTER — Encounter: Payer: Self-pay | Admitting: Physical Therapy

## 2020-06-24 ENCOUNTER — Ambulatory Visit: Payer: Medicare Other | Admitting: Occupational Therapy

## 2020-06-24 ENCOUNTER — Ambulatory Visit: Payer: Medicare Other | Admitting: Physical Therapy

## 2020-06-24 ENCOUNTER — Other Ambulatory Visit: Payer: Self-pay

## 2020-06-24 ENCOUNTER — Ambulatory Visit: Payer: Medicare Other

## 2020-06-24 DIAGNOSIS — I69018 Other symptoms and signs involving cognitive functions following nontraumatic subarachnoid hemorrhage: Secondary | ICD-10-CM

## 2020-06-24 DIAGNOSIS — M6281 Muscle weakness (generalized): Secondary | ICD-10-CM

## 2020-06-24 DIAGNOSIS — R2681 Unsteadiness on feet: Secondary | ICD-10-CM

## 2020-06-24 DIAGNOSIS — R2689 Other abnormalities of gait and mobility: Secondary | ICD-10-CM | POA: Diagnosis not present

## 2020-06-24 DIAGNOSIS — R41841 Cognitive communication deficit: Secondary | ICD-10-CM | POA: Diagnosis not present

## 2020-06-24 DIAGNOSIS — R4701 Aphasia: Secondary | ICD-10-CM | POA: Diagnosis not present

## 2020-06-24 NOTE — Patient Instructions (Addendum)
Access Code: [email or web address] URL: [email or web address] Date: 06/20/2020 Prepared by: Willow Ora  Exercises Walking March - 1-2 x daily - 5 x weekly - 2 sets - 4 reps - 2 hold Tandem Walking with Counter Support - 1 x daily - 5 x weekly - 1 sets - 3 reps  2/28 Continued to review HEP and Pt performed all exercises below Standing Single Leg Stance with Counter Support - 1-2 x daily - 5 x weekly - 1-2 sets - 4 reps - 15 hold  (Pt required intermittent UE support) Heel Walking with Counter Support - 1 x daily - 5 x weekly - 1 sets - 4 reps  (recommend pt progress by performing on outdoor surfaces) Toe Walking with Counter Support - 1 x daily - 5 x weekly - 1 sets - 4 reps Romberg Stance Eyes Closed on Foam Pad - 1 x daily - 5 x weekly - 1 sets - 3 reps - 30 hold (progress with eyes closed head turns/nods x10 each)

## 2020-06-24 NOTE — Therapy (Signed)
[facility] [address] [place], [name], [address] Phone: [phone]   Fax:  [phone]  Physical Therapy Treatment  Patient Details  Name: Jeffrey Acosta MRN: [id number] Date of Birth: 17-Dec-1950 Referring Provider (PT): Erlinda Hong, Fang/Dr. Kathlene November, PCP   Encounter Date: 06/24/2020   PT End of Session - 06/24/20 1220    Visit Number 7    PT Start Time 1017    PT Stop Time 1100    PT Time Calculation (min) 43 min    Behavior During Therapy [facility] for tasks assessed/performed           Past Medical History:  Diagnosis Date  . Alcoholism in remission ([place])    in [place]  . Arrhythmia    atrial tachycardia  . Bipolar affective disorder ([place])   . Diverticulosis of colon (without mention of hemorrhage)   . GERD (gastroesophageal reflux disease)   . Gout of multiple sites   . Hiatal hernia   . Hx of migraines   . Hydrocele   . HYPERLIPIDEMIA 01/10/2009   Qualifier: Diagnosis of  By: Ronnald Ramp CMA, Chemira    NMR Lipoprofile 2011 LDL could not be calculated  Due to TG of 889  (811  / 750 ),  HDL 50 . Father MI @ 47 PGM CVA early 5s; PGF CVA '@72'    . Hypothyroidism   . Melanoma of skin, site unspecified 11/25/2012   07/2011 abdominal  Stage 1 Dr Amy [place] Dr Sarajane Jews   . Osteopenia   . Polyarthralgia     Past Surgical History:  Procedure Laterality Date  . BILIARY BRUSHING  12/07/2018   Procedure: BILIARY BRUSHING;  Surgeon: Rush Landmark Telford Nab., MD;  Location: [facility];  Service: Gastroenterology;;  . BILIARY DILATION  12/07/2018   Procedure: BILIARY DILATION;  Surgeon: Irving Copas., MD;  Location: [facility];  Service: Gastroenterology;;  . BIOPSY  12/07/2018   Procedure: BIOPSY;  Surgeon: Irving Copas., MD;  [facility];  Service: Gastroenterology;;  . COLONOSCOPY  2003   negative ; Dr Sharlett Iles  . ERCP N/A 12/07/2018   Procedure: ENDOSCOPIC RETROGRADE CHOLANGIOPANCREATOGRAPHY (ERCP);   Surgeon: Irving Copas., MD;  Location: [facility];  Service: Gastroenterology;  Laterality: N/A;  . ESOPHAGOGASTRODUODENOSCOPY (EGD) WITH PROPOFOL N/A 12/07/2018   Procedure: ESOPHAGOGASTRODUODENOSCOPY (EGD) WITH PROPOFOL;  Surgeon: Rush Landmark Telford Nab., MD;  [facility];  Service: Gastroenterology;  Laterality: N/A;  . EUS N/A 12/07/2018   Procedure: UPPER ENDOSCOPIC ULTRASOUND (EUS) RADIAL;  Surgeon: Rush Landmark Telford Nab., MD;  [facility];  Service: Gastroenterology;  Laterality: N/A;  . INGUINAL HERNIA REPAIR  08/07/2011   Procedure: HERNIA REPAIR INGUINAL ADULT;  Surgeon: Rolm Bookbinder, MD;  Location: [facility];  Service: General;  Laterality: Right;  . IR ANGIO INTRA EXTRACRAN SEL INTERNAL CAROTID BILAT MOD SED  05/14/2020  . IR ANGIO VERTEBRAL SEL VERTEBRAL BILAT MOD SED  05/14/2020  . MELANOMA EXCISION  07/28/11   Dr Sarajane Jews; stomach and chest  . REMOVAL OF STONES  12/07/2018   Procedure: REMOVAL OF STONES;  Surgeon: Rush Landmark Telford Nab., MD;  Location: [facility];  Service: Gastroenterology;;  . [name]  1951  . 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 UPPER GASTROINTESTINAL ENDOSCOPY  1983   hiatal hernia  . VASECTOMY  1979    There were no vitals filed for this visit.   Subjective Assessment - 06/24/20 0932    Subjective " Back to  pretty much doing all my regular stuff."    Patient is accompained by: Family member   spouse [name]   Patient Stated Goals Pt's goals for therapy are to be back to being independent and to get rid of walker.    Currently in Pain? No/denies                    2/28 Continued to review HEP and Pt performed exercises below: Standing Single Leg Stance with Counter Support - 1-2 x daily - 5 x weekly - 1-2 sets - 4 reps - 15 hold  (Pt required intermittent UE support) Heel Walking with Counter Support - 1 x daily - 5 x weekly - 1 sets - 4 reps  (recommend pt progress by  performing on outdoor surfaces) Toe Walking with Counter Support - 1 x daily - 5 x weekly - 1 sets - 4 reps Romberg Stance Eyes Closed on Foam Pad - 1 x daily - 5 x weekly - 1 sets - 3 reps - 30 hold (progress with eyes closed head turns/nods x10 each)            [facility] Adult PT Treatment/Exercise - 06/24/20 0001      Transfers   Transfers Floor to Transfer   pt performed modified independent              Balance Exercises - 06/24/20 0001      Balance Exercises: Standing   Stepping Strategy Anterior;Posterior;Foam/compliant surface;UE support   Progressing to intermittent/ without UE. pt standing on foam balance beam. Cues for weight shifitng and clearance when stepping.   Other Balance Exercises in quadruped and 1/2kneel Quadruped: contralateral UE/LE raises x10,  side sit<>quadruped transitions x10, quadruped to 1/2kneel x5 cues for core activitation and posture; intermittent UE support for balance.             PT Education - 06/24/20 1220    Education Details Review of current HEP and updates to progress balance exercises.            PT Short Term Goals - 06/20/20 0935      PT SHORT TERM GOAL #1   Title Pt will be independent with HEP for improved strength, balance, transfers, and gait.  06/21/2020    Baseline 06/20/20: met with current program. needs to be updated as current progam is easy per pt report    Time --    Period --    Status Achieved      PT SHORT TERM GOAL #2   Title Pt will improve 5x sit<>stand to less than or equal to 15 sec to demonstrate improved functional strength and transfer efficiency.    Baseline 06/20/20: 11.96 sec's no UE support form standard height surface    Time --    Period --    Status Achieved      PT SHORT TERM GOAL #3   Title Pt will improve TUG score to less than or equal to 13.5 sec for decreased fall risk.    Baseline 06/20/20; 8.91 sec's no device    Time --    Period --    Status Achieved      PT SHORT TERM GOAL  #4   Title Pt will improve Berg score to at least 47/56 for decreased fall risk.    Baseline 06/20/20: 53/56 scored today    Time --    Period --    Status Achieved      PT SHORT  TERM GOAL #5   Title Pt will ambulate short distances, 50-100 ft with SPC with supervision, for improved transition to more independent household gait.    Baseline 06/20/20: met with no device    Time --    Period --    Status Achieved             PT Long Term Goals - 06/21/20 0912      PT LONG TERM GOAL #1   Title Pt will be independent with progression of HEP for improved strength, balance, transfers, and gait.  TARGET 07/19/2020    Time 9    Period Weeks    Status On-going      PT LONG TERM GOAL #2   Title Pt will improve 5x sit<>stand to less than or equal to 12.5 sec to demonstrate improved functional strength and transfer efficiency.    Baseline 06/20/20: 11.96 sec's no UE support from standard height surface    Status Achieved      PT LONG TERM GOAL #3   Title Pt will improve gait velocity to at least 2.62 ft/sec for improved gait efficiency and safety.    Baseline 06/20/20: 3.66 ft/sec no AD    Status Achieved      PT LONG TERM GOAL #4   Title Pt will be mod independent with outdoor gait, at least 1000 ft, using cane versus no device, for improved community gait.    Time 9    Period Weeks    Status On-going      PT LONG TERM GOAL #5   Title Pt will verbalize understanding of fall prevention in home environment.    Time 9    Period Weeks    Status On-going                 Plan - 06/24/20 1212    Clinical Impression Statement Skilled sesssion focused on reviewing and updating current HEP and working on balance, core and hip strength in quadruped and 1/2 kneel positions.  Pt continues to be challenged with most of the exercises for HEP but updated romberg with head nods/turns and encouraged pt to perform marching and heel walk on various surfaces to progress challange.  Pt is  challenged by exercise performed in quadruped and 1/2 kneel demonstrating decreased balance and weakness in hips extensors. Pt tolerated positions well.    Personal Factors and Comorbidities Comorbidity 3+    Comorbidities See notes    Examination-Activity Limitations Locomotion Level;Transfers;Stand    Examination-Participation Restrictions Shop;Community Activity    Stability/Clinical Decision Making Evolving/Moderate complexity    Rehab Potential Good    PT Frequency 2x / week    PT Duration Other (comment)   for 8 weeks, plus 1 additional visit week of eval   PT Treatment/Interventions ADLs/Self Care Home Management;DME Instruction;Neuromuscular re-education;Balance training;Therapeutic exercise;Therapeutic activities;Functional mobility training;Stair training;Gait training;Patient/family education    PT Next Visit Plan balance, core and hip strengthening in quadruped and 1/2kneel positions; revise remainder of HEP; high level balance/dynamic gait with no AD, balance on compliant surfaces    PT Home Exercise Plan Access Code: [email or web address]    Consulted and Agree with Plan of Care Patient;Family member/caregiver    Family Member Consulted wife-[name]           Patient will benefit from skilled therapeutic intervention in order to improve the following deficits and impairments:  Abnormal gait,Difficulty walking,Decreased safety awareness,Decreased balance,Decreased mobility,Decreased strength  Visit Diagnosis: Muscle weakness (generalized)  Unsteadiness on feet  Other abnormalities of gait and mobility     Problem List Patient Active Problem List   Diagnosis Date Noted  . Dyslipidemia 05/23/2020  . Benign essential HTN 05/06/2020  . Subarachnoid hemorrhage ([place]) 04/30/2020  . Leukopenia 07/15/2018  . Thrombocytopenia ([place]) 07/15/2018  . PCP NOTES >>>>>>> 03/23/2015  . Annual physical exam 03/20/2015  . Bronchospasm 09/06/2014  . DJD (degenerative joint disease) 09/06/2014  .  Hypothyroidism 08/28/2014  . Tremor 07/29/2014  . Gait disorder 07/29/2014  . Gout 01/08/2014  . Vitamin D deficiency 11/25/2012  . Melanoma of skin, site unspecified 11/25/2012  . 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 Alcoholism in remission ([place]) 06/19/2011  . Diverticulitis of colon (without mention of hemorrhage)(562.11) 06/19/2011  . Bipolar I disorder ([place]) 06/19/2011  . Osteopenia 05/17/2010  . Alcohol abuse 05/16/2010  . HYPERTRIGLYCERIDEMIA 08/02/2009  . Macrocytic anemia 01/10/2009  . ABNORMAL ELECTROCARDIOGRAM 11/26/2008  . REFLUX, ESOPHAGEAL 11/17/2006  . COMMON MIGRAINE 09/22/2006    Bjorn Loser, PTA  06/24/20, 12:32 PM [facility] [address] [place], [name], [address] Phone: [phone]   Fax:  [phone]  Name: Jeffrey Acosta MRN: [id number] Date of Birth: [birth year]

## 2020-06-24 NOTE — Therapy (Addendum)
[facility] [address] [place], [name], [address] Phone: [phone]   Fax:  [phone]  Occupational Therapy Treatment  Patient Details  Name: Jeffrey Acosta MRN: [id number] Date of Birth: [birth year] Referring Provider (OT): Dr. Florencia Reasons   Encounter Date: 06/24/2020   OT End of Session - 06/24/20 1018    Visit Number 8    Number of Visits 17    Date for OT Re-Evaluation 07/19/20    Authorization Type BCBS Medicare, copay per disc, no auth/VL (cognitive retraining not covered)    Authorization Time Period cert. date 05/21/19-08/18/20    Authorization - Visit Number 8    Authorization - Number of Visits 10    OT Start Time 1019    OT Stop Time 1100    OT Time Calculation (min) 41 min    Activity Tolerance Patient tolerated treatment well    Behavior During Therapy WFL for tasks assessed/performed           Past Medical History:  Diagnosis Date  . Alcoholism in remission ([place])    in [place]  . Arrhythmia    atrial tachycardia  . Bipolar affective disorder ([place])   . Diverticulosis of colon (without mention of hemorrhage)   . GERD (gastroesophageal reflux disease)   . Gout of multiple sites   . Hiatal hernia   . Hx of migraines   . Hydrocele   . HYPERLIPIDEMIA 01/10/2009   Qualifier: Diagnosis of  By: Ronnald Ramp CMA, [place]    NMR Lipoprofile 2011 LDL could not be calculated  Due to TG of 889  (811  / 750 ),  HDL 50 . Father MI @ 63 PGM CVA early 62s; PGF CVA '@72'    . Hypothyroidism   . Melanoma of skin, site unspecified 11/25/2012   07/2011 abdominal  Stage 1 Dr Amy [place] Dr Sarajane Jews   . Osteopenia   . Polyarthralgia     Past Surgical History:  Procedure Laterality Date  . BILIARY BRUSHING  12/07/2018   Procedure: BILIARY BRUSHING;  Surgeon: Rush Landmark Telford Nab., MD;  Location: [facility];  Service: Gastroenterology;;  . BILIARY DILATION  12/07/2018   Procedure: BILIARY DILATION;  Surgeon: Irving Copas., MD;  Location: [facility];  Service: Gastroenterology;;  . BIOPSY  12/07/2018   Procedure: BIOPSY;  Surgeon: Irving Copas., MD;  [facility];  Service: Gastroenterology;;  . COLONOSCOPY  2003   negative ; Dr Sharlett Iles  . ERCP N/A 12/07/2018   Procedure: ENDOSCOPIC RETROGRADE CHOLANGIOPANCREATOGRAPHY (ERCP);  Surgeon: Irving Copas., MD;  Location: [facility];  Service: Gastroenterology;  Laterality: N/A;  . ESOPHAGOGASTRODUODENOSCOPY (EGD) WITH PROPOFOL N/A 12/07/2018   Procedure: ESOPHAGOGASTRODUODENOSCOPY (EGD) WITH PROPOFOL;  Surgeon: Rush Landmark Telford Nab., MD;  [facility];  Service: Gastroenterology;  Laterality: N/A;  . EUS N/A 12/07/2018   Procedure: UPPER ENDOSCOPIC ULTRASOUND (EUS) RADIAL;  Surgeon: Rush Landmark Telford Nab., MD;  [facility];  Service: Gastroenterology;  Laterality: N/A;  . INGUINAL HERNIA REPAIR  08/07/2011   Procedure: HERNIA REPAIR INGUINAL ADULT;  Surgeon: Rolm Bookbinder, MD;  Location: [facility];  Service: General;  Laterality: Right;  . IR ANGIO INTRA EXTRACRAN SEL INTERNAL CAROTID BILAT MOD SED  05/14/2020  . IR ANGIO VERTEBRAL SEL VERTEBRAL BILAT MOD SED  05/14/2020  . MELANOMA EXCISION  07/28/11   Dr Sarajane Jews; stomach and chest  . REMOVAL OF STONES  12/07/2018   Procedure: REMOVAL OF STONES;  Surgeon: Rush Landmark Telford Nab., MD;  [facility];  Service: Gastroenterology;;  . [place]  . 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 UPPER GASTROINTESTINAL ENDOSCOPY  1983   hiatal hernia  . VASECTOMY  1979    There were no vitals filed for this visit.   Subjective Assessment - 06/24/20 1018    Subjective  Pt/wife report that pt is doing his own breakfast, pt reports writing checks, and doing household cleaning (making bed, straightening).  Pt does report that he has to focus on slowing down and that he feels like everything is getting better.  Wife reports  that her mother had a fall and so she has been away from home more to care for mother and that pt is doing well.    Patient is accompanied by: Family member   wife   Pertinent History subarachnoid hemorrhage (CT 04/30/20 showed moderate to large acute SAH; 05/11/20 MRI brain showed small acute infarcts in L basal ganglia, L periatrial temporal lobe and high bilateral frontoparietal regions).  PMH:  hx of alcohol use with intoxication on hospital presentation, hypothyroidism, atrial tachycardia, Barrett's esophagus, hx of atrial tachycardia, osteopenia, thrombocytopenia, DJD, gout, melanoma of skin, bipolar disorder    Patient Stated Goals return to previous activities    Currently in Pain? No/denies           Deductive Reasoning Puzzle (4 pieces of info:  driver, name, lane, place) with mod cueing for problem-solving/reasoning--improved from last visit.   Ambulating while tossing ball between hands and performing category generation with good performance, but when dropped ball, pt needed min cueing/assist initially for impulsivity and LOB x2 (then improved, with less impulsivity with drops--5 total drops).    Arm bike x 6 min, level 7 for conditioning, forward/back.  Change making sheet with 90% accuracy (9/10 correct).  Pt able to correct error once identified.  Pt initiated checking himself (and adding amount back).  Began discussing progressing and checking STGs.         OT Education - 06/24/20 1138    Education Details Importance of slowing down and stopping prior to bending to pick up something for incr safety.    Person(s) Educated Patient;Spouse    Methods Explanation    Comprehension Verbalized understanding            OT Short Term Goals - 06/24/20 1024      OT SHORT TERM GOAL #1   Title Pt will be independent with HEP for bilateral shoulder strength.--check STGs 06/20/20    Time 4    Period Weeks    Status Achieved      OT SHORT TERM GOAL #2   Title Pt will be able  to perform shower transfer with supervision.    Time 4    Period Weeks    Status Achieved   06/10/20:  met at this level     OT [place] #3   Title Pt will be able to perform simple alternating attention with at least 90% accuracy for incr safety.    Time 4    Period Weeks    Status On-going      OT SHORT TERM GOAL #4   Title Pt will perform mod complex functional problem solving with at least 90% accuracy.    Time 4    Period Weeks    Status On-going      OT SHORT TERM GOAL #5   Title Pt will verbalize understanding of cognitive compensation strategies  for ADLs/IADLs.    Time 4    Period Weeks    Status On-going             OT Long Term Goals - 05/20/20 0925      OT LONG TERM GOAL #1   Title Pt will able to reach at least 10" bilaterally on standing functional reach test without cueing/impulsivity for improved safety for IADLs.    Time 8    Period Weeks    Status New      OT LONG TERM GOAL #2   Title Pt will demo good safety awareness for retrieving items from low shelf/floor mod I/without cueing.    Time 8    Period Weeks    Status New      OT LONG TERM GOAL #3   Title Pt will perform simple prior cooking/home maintenance tasks mod I and demonstrating good safety awareness/problem solving.    Time 8    Period Weeks    Status New      OT LONG TERM GOAL #4   Title Pt will perform simple divided attention with at least 1 physical and 1 cognitive tasks for improved safety.    Time 8    Period Weeks    Status New      OT LONG TERM GOAL #5   Title Pt will perform shower transfer mod I.    Time 8    Period Weeks    Status New                 Plan - 06/24/20 1018    Clinical Impression Statement Pt is progressing towards goals with improved IADL performance.  Pt demo improving alternating/divided attention; however continues to demo impulsivity at times (fall risk).  Pt demo difficulty with deductive reasoning today.    OT Occupational Profile  and History Detailed Assessment- Review of Records and additional review of physical, cognitive, psychosocial history related to current functional performance    Occupational performance deficits (Please refer to evaluation for details): ADL's;IADL's    Body Structure / Function / Physical Skills ADL;Strength;Balance;IADL;Endurance;Mobility;Coordination;Decreased knowledge of precautions    Cognitive Skills Problem Solve;Safety Awareness;Attention;Memory;Sequencing    Rehab Potential Good    Clinical Decision Making Several treatment options, min-mod task modification necessary    Comorbidities Affecting Occupational Performance: May have comorbidities impacting occupational performance    Modification or Assistance to Complete Evaluation  Min-Moderate modification of tasks or assist with assess necessary to complete eval    OT Frequency 2x / week    OT Duration 8 weeks   +eval or 17 total visits   OT Treatment/Interventions Self-care/ADL training;DME and/or AE instruction;Balance training;Therapeutic activities;Cognitive remediation/compensation;Therapeutic exercise;Neuromuscular education;Functional Mobility Training;Patient/family education;Manual Therapy;Passive range of motion;Energy conservation;Cryotherapy;Moist Heat    Plan finish checking STGs/continue with Cognitive Compensation Strategies; alternating attention, mod complex problem solving    Consulted and Agree with Plan of Care Patient;Family member/caregiver    Family Member Consulted wife           Patient will benefit from skilled therapeutic intervention in order to improve the following deficits and impairments:   Body Structure / Function / Physical Skills: ADL,Strength,Balance,IADL,Endurance,Mobility,Coordination,Decreased knowledge of precautions Cognitive Skills: Problem Solve,Safety Awareness,Attention,Memory,Sequencing     Visit Diagnosis: Other symptoms and signs involving cognitive functions following nontraumatic  subarachnoid hemorrhage  Muscle weakness (generalized)  Unsteadiness on feet    Problem List Patient Active Problem List   Diagnosis Date Noted  . Dyslipidemia 05/23/2020  . Benign essential  HTN 05/06/2020  . Subarachnoid hemorrhage ([place]) 04/30/2020  . Leukopenia 07/15/2018  . Thrombocytopenia ([place]) 07/15/2018  . PCP NOTES >>>>>>> 03/23/2015  . Annual physical exam 03/20/2015  . Bronchospasm 09/06/2014  . DJD (degenerative joint disease) 09/06/2014  . Hypothyroidism 08/28/2014  . Tremor 07/29/2014  . Gait disorder 07/29/2014  . Gout 01/08/2014  . Vitamin D deficiency 11/25/2012  . Melanoma of skin, site unspecified 11/25/2012  . 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) 11/25/2012  . Alcoholism in remission ([place]) 06/19/2011  . Diverticulitis of colon (without mention of hemorrhage)(562.11) 06/19/2011  . Bipolar I disorder ([place]) 06/19/2011  . Osteopenia 05/17/2010  . Alcohol abuse 05/16/2010  . HYPERTRIGLYCERIDEMIA 08/02/2009  . Macrocytic anemia 01/10/2009  . ABNORMAL ELECTROCARDIOGRAM 11/26/2008  . REFLUX, ESOPHAGEAL 11/17/2006  . COMMON MIGRAINE 09/22/2006    [facility] 06/24/2020, 12:04 PM  [facility] [address] [place], [name], [address] Phone: [phone]   Fax:  [phone]  Name: IZICK GASBARRO MRN: [id number] Date of Birth: 11-06-1950   Vianne Bulls, OTR/L [facility] [address] [place], [place]  [address] [phone] phone [phone] 06/24/20 12:04 PM

## 2020-06-25 HISTORY — PX: LOOP RECORDER INSERTION: EP1214

## 2020-06-25 NOTE — Therapy (Addendum)
[facility] [address] [place], [name], [address] Phone: [phone]   Fax:  [phone]  Speech Language Pathology Treatment  Patient Details  Name: Jeffrey Acosta MRN: [id number] Date of Birth: [birth year] Referring Provider (SLP): Florencia Reasons (referring); Deetta Perla, MD (doc)   Encounter Date: 06/24/2020   End of Session - 06/25/20 1127    Visit Number 7    Number of Visits 17    Date for SLP Re-Evaluation 08/16/20    SLP Start Time 1104    SLP Stop Time  1146    SLP Time Calculation (min) 42 min    Activity Tolerance Patient tolerated treatment well           Past Medical History:  Diagnosis Date  . Alcoholism in remission ([place])    in [place]  . Arrhythmia    atrial tachycardia  . Bipolar affective disorder ([place])   . Diverticulosis of colon (without mention of hemorrhage)   . GERD (gastroesophageal reflux disease)   . Gout of multiple sites   . Hiatal hernia   . Hx of migraines   . Hydrocele   . HYPERLIPIDEMIA 01/10/2009   Qualifier: Diagnosis of  By: Ronnald Ramp CMA, Chemira    NMR Lipoprofile 2011 LDL could not be calculated  Due to TG of 889  (811  / 750 ),  HDL 50 . Father MI @ 61 PGM CVA early 33s; PGF CVA @72    . Hypothyroidism   . Melanoma of skin, site unspecified 11/25/2012   07/2011 abdominal  Stage 1 Dr Amy Martinique Dr Sarajane Jews   . Osteopenia   . Polyarthralgia     Past Surgical History:  Procedure Laterality Date  . BILIARY BRUSHING  12/07/2018   Procedure: BILIARY BRUSHING;  Surgeon: Rush Landmark Telford Nab., MD;  Location: [facility];  Service: Gastroenterology;;  . BILIARY DILATION  12/07/2018   Procedure: BILIARY DILATION;  Surgeon: Irving Copas., MD;  Location: [facility];  Service: Gastroenterology;;  . BIOPSY  12/07/2018   Procedure: BIOPSY;  Surgeon: Irving Copas., MD;  Location: [facility];  Service: Gastroenterology;;  . COLONOSCOPY  2003   negative ; Dr Sharlett Iles   . ERCP N/A 12/07/2018   Procedure: ENDOSCOPIC RETROGRADE CHOLANGIOPANCREATOGRAPHY (ERCP);  Surgeon: Irving Copas., MD;  Location: [facility];  Service: Gastroenterology;  Laterality: N/A;  . ESOPHAGOGASTRODUODENOSCOPY (EGD) WITH PROPOFOL N/A 12/07/2018   Procedure: ESOPHAGOGASTRODUODENOSCOPY (EGD) WITH PROPOFOL;  Surgeon: Rush Landmark Telford Nab., MD;  Location: [facility];  Service: Gastroenterology;  Laterality: N/A;  . EUS N/A 12/07/2018   Procedure: UPPER ENDOSCOPIC ULTRASOUND (EUS) RADIAL;  Surgeon: Rush Landmark Telford Nab., MD;  Location: [facility];  Service: Gastroenterology;  Laterality: N/A;  . INGUINAL HERNIA REPAIR  08/07/2011   Procedure: HERNIA REPAIR INGUINAL ADULT;  Surgeon: Rolm Bookbinder, MD;  Location: [facility];  Service: General;  Laterality: Right;  . IR ANGIO INTRA EXTRACRAN SEL INTERNAL CAROTID BILAT MOD SED  05/14/2020  . IR ANGIO VERTEBRAL SEL VERTEBRAL BILAT MOD SED  05/14/2020  . MELANOMA EXCISION  07/28/11   Dr Sarajane Jews; stomach and chest  . REMOVAL OF STONES  12/07/2018   Procedure: REMOVAL OF STONES;  Surgeon: Rush Landmark Telford Nab., MD;  Location: [facility];  Service: Gastroenterology;;  . [name]  1951  . 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 UPPER GASTROINTESTINAL ENDOSCOPY  1983   hiatal hernia  . VASECTOMY  1979    There were no vitals filed  for this visit.   Subjective Assessment - 06/24/20 1109    Subjective Pt and wife agree pt is baseline for cognition.    Patient is accompained by: Family member   [name] - wife   Currently in Pain? No/denies                 ADULT SLP TREATMENT - 06/25/20 0001      General Information   Behavior/Cognition Alert;Cooperative;Pleasant mood;Hard of hearing;Requires cueing      Treatment Provided   Treatment provided Cognitive-Linquistic      Cognitive-Linquistic Treatment   Treatment focused on Aphasia;Patient/family/caregiver education    Skilled  Treatment Since pt cont to report anomia approx once/day SLP introduced semantic feature analysis and assisted pt work through three examples. Pt req'd fading cues Pt has not made any headway with physical goal of walking x3/week mostly due to scheduling difficulties of wife (family medical concerns).      Assessment / Recommendations / Plan   Plan --   decr to once/week - pt agrees     Progression Toward Goals   Progression toward goals Progressing toward goals              SLP Short Term Goals - 06/25/20 1131      SLP SHORT TERM GOAL #1   Title Pt will engage with appropriate topic changes in 10 minutes min-mod complex conversation in 3 sessions    Baseline 06-20-20    Period Weeks    Status Achieved      SLP SHORT TERM GOAL #2   Title pt will be able to verbalize three non-physical deficits by giving examples from home or within the ST session, in 2 sessions    Baseline 06-12-20, 06-20-20    Status Achieved      SLP SHORT TERM GOAL #3   Title pt will tell SLP his medication schedule independently, with modified independence    Status Achieved      SLP SHORT TERM GOAL #4   Title pt will complete cognitive lingiustic and aphasia evaluations PRN    Status Achieved      SLP SHORT TERM GOAL #5   Title pt will engage in mod complex conversation for 15 minutes with compensations in 3 sessions    Baseline 06-20-20, 06-24-20    Period Weeks    Status Achieved            SLP Long Term Goals - 06/25/20 1132      SLP LONG TERM GOAL #1   Title pt will perform written aphasia tasks and answer "wh" questions using aphasia compensations PRN, in 3 sessions    Baseline 06-24-20    Time 4    Period Weeks   or 17 sessions, for all LTGs   Status On-going      SLP LONG TERM GOAL #2   Title pt will ensure accurate written responses using compensatory measures for therapy work 100% of the time    Time 4    Period Weeks    Status Deferred      SLP LONG TERM GOAL #3   Title pt will  write information functionally (spelling, word choice, etc)  in his memory book between 3 sessions    Time 4    Period Weeks    Status Deferred      SLP LONG TERM GOAL #4   Title pt will engage in 15 minutes mod complex/complex conversation successfully using compensation strategies in 3 sessions  Baseline 06-20-20, 06-24-20    Time 4    Period Weeks    Status On-going            Plan - 06/25/20 1127    Clinical Impression Statement Jeffrey Acosta and his wife report significant improvements in cognition to point that wife and pt state pt is at baseline. Because pt reports anomia x1/day, semantic feature analysis was introduced. Pt would continue to benefit from skilled ST services once a week to maximize cognitive linguistic skills as pt is nearing return to PLOF.    Speech Therapy Frequency 1x /week    Duration 8 weeks    Treatment/Interventions Environmental controls;Language facilitation;Cueing hierarchy;Cognitive reorganization;Internal/external aids;Patient/family education;SLP instruction and feedback;Functional tasks    Potential to Achieve Goals Good    SLP Home Exercise Plan environmental modifications, pill organizer    Consulted and Agree with Plan of Care Patient;Family member/caregiver           Patient will benefit from skilled therapeutic intervention in order to improve the following deficits and impairments:   Aphasia  Cognitive communication deficit    Problem List Patient Active Problem List   Diagnosis Date Noted  . Dyslipidemia 05/23/2020  . Benign essential HTN 05/06/2020  . Subarachnoid hemorrhage ([place]) 04/30/2020  . Leukopenia 07/15/2018  . Thrombocytopenia ([place]) 07/15/2018  . PCP NOTES >>>>>>> 03/23/2015  . Annual physical exam 03/20/2015  . Bronchospasm 09/06/2014  . DJD (degenerative joint disease) 09/06/2014  . Hypothyroidism 08/28/2014  . Tremor 07/29/2014  . Gait disorder 07/29/2014  . Gout 01/08/2014  . Vitamin D deficiency 11/25/2012  .  Melanoma of skin, site unspecified 11/25/2012  . 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 Alcoholism in remission ([place]) 06/19/2011  . Diverticulitis of colon (without mention of hemorrhage)(562.11) 06/19/2011  . Bipolar I disorder ([place]) 06/19/2011  . Osteopenia 05/17/2010  . Alcohol abuse 05/16/2010  . HYPERTRIGLYCERIDEMIA 08/02/2009  . Macrocytic anemia 01/10/2009  . ABNORMAL ELECTROCARDIOGRAM 11/26/2008  . REFLUX, ESOPHAGEAL 11/17/2006  . COMMON MIGRAINE 09/22/2006    [facility] 06/25/2020, 11:39 AM  [facility] [address] [place], [name], [address] Phone: [phone]   Fax:  [phone]   Name: Jeffrey Acosta MRN: [id number] Date of Birth: 1951/04/08

## 2020-06-26 ENCOUNTER — Ambulatory Visit: Payer: Medicare Other | Admitting: Occupational Therapy

## 2020-06-26 ENCOUNTER — Encounter: Payer: Self-pay | Admitting: Occupational Therapy

## 2020-06-26 ENCOUNTER — Encounter: Payer: Self-pay | Admitting: Physical Therapy

## 2020-06-26 ENCOUNTER — Ambulatory Visit: Payer: Medicare Other | Attending: Internal Medicine | Admitting: Physical Therapy

## 2020-06-26 ENCOUNTER — Other Ambulatory Visit: Payer: Self-pay

## 2020-06-26 ENCOUNTER — Ambulatory Visit: Payer: Medicare Other

## 2020-06-26 DIAGNOSIS — R2681 Unsteadiness on feet: Secondary | ICD-10-CM

## 2020-06-26 DIAGNOSIS — M6281 Muscle weakness (generalized): Secondary | ICD-10-CM | POA: Diagnosis not present

## 2020-06-26 DIAGNOSIS — I69018 Other symptoms and signs involving cognitive functions following nontraumatic subarachnoid hemorrhage: Secondary | ICD-10-CM

## 2020-06-26 DIAGNOSIS — R2689 Other abnormalities of gait and mobility: Secondary | ICD-10-CM | POA: Diagnosis not present

## 2020-06-26 NOTE — Therapy (Signed)
[facility] [address] [place], [name], [address] Phone: [phone]   Fax:  [phone]  Occupational Therapy Treatment  Patient Details  Name: Jeffrey Acosta MRN: [id number] Date of Birth: 01-27-1951 Referring Provider (OT): Dr. Florencia Reasons   Encounter Date: 06/26/2020   OT End of Session - 06/26/20 1012    Visit Number 9    Number of Visits 17    Date for OT Re-Evaluation 07/19/20    Authorization Type BCBS Medicare, copay per disc, no auth/VL (cognitive retraining not covered)    Authorization Time Period cert. date 05/21/19-08/18/20    Authorization - Visit Number 9    Authorization - Number of Visits 10    OT Start Time 0933    OT Stop Time 1014    OT Time Calculation (min) 41 min    Activity Tolerance Patient tolerated treatment well    Behavior During Therapy [facility] for tasks assessed/performed           Past Medical History:  Diagnosis Date  . Alcoholism in remission ([place])    in [place]  . Arrhythmia    atrial tachycardia  . Bipolar affective disorder ([place])   . Diverticulosis of colon (without mention of hemorrhage)   . GERD (gastroesophageal reflux disease)   . Gout of multiple sites   . Hiatal hernia   . Hx of migraines   . Hydrocele   . HYPERLIPIDEMIA 01/10/2009   Qualifier: Diagnosis of  By: Ronnald Ramp CMA, Chemira    NMR Lipoprofile 2011 LDL could not be calculated  Due to TG of 889  (811  / 750 ),  HDL 50 . Father MI @ 83 PGM CVA early 39s; PGF CVA _0    . Hypothyroidism   . Melanoma of skin, site unspecified 11/25/2012   07/2011 abdominal  Stage 1 Dr Amy Martinique Dr Sarajane Jews   . Osteopenia   . Polyarthralgia     Past Surgical History:  Procedure Laterality Date  . BILIARY BRUSHING  12/07/2018   Procedure: BILIARY BRUSHING;  Surgeon: Rush Landmark Telford Nab., MD;  Location: [facility];  Service: Gastroenterology;;  . BILIARY DILATION  12/07/2018   Procedure: BILIARY DILATION;  Surgeon: Irving Copas., MD;  Location: [facility];  Service: Gastroenterology;;  . BIOPSY  12/07/2018   Procedure: BIOPSY;  Surgeon: Irving Copas., MD;  Location: [facility];  Service: Gastroenterology;;  . COLONOSCOPY  2003   negative ; Dr Sharlett Iles  . ERCP N/A 12/07/2018   Procedure: ENDOSCOPIC RETROGRADE CHOLANGIOPANCREATOGRAPHY (ERCP);  Surgeon: Irving Copas., MD;  Location: [facility];  Service: Gastroenterology;  Laterality: N/A;  . ESOPHAGOGASTRODUODENOSCOPY (EGD) WITH PROPOFOL N/A 12/07/2018   Procedure: ESOPHAGOGASTRODUODENOSCOPY (EGD) WITH PROPOFOL;  Surgeon: Rush Landmark Telford Nab., MD;  Location: [facility];  Service: Gastroenterology;  Laterality: N/A;  . EUS N/A 12/07/2018   Procedure: UPPER ENDOSCOPIC ULTRASOUND (EUS) RADIAL;  Surgeon: Rush Landmark Telford Nab., MD;  Location: [facility];  Service: Gastroenterology;  Laterality: N/A;  . INGUINAL HERNIA REPAIR  08/07/2011   Procedure: HERNIA REPAIR INGUINAL ADULT;  Surgeon: Rolm Bookbinder, MD;  Location: [facility];  Service: General;  Laterality: Right;  . IR ANGIO INTRA EXTRACRAN SEL INTERNAL CAROTID BILAT MOD SED  05/14/2020  . IR ANGIO VERTEBRAL SEL VERTEBRAL BILAT MOD SED  05/14/2020  . MELANOMA EXCISION  07/28/11   Dr Sarajane Jews; stomach and chest  . REMOVAL OF STONES  12/07/2018   Procedure: REMOVAL OF STONES;  Surgeon: Rush Landmark Telford Nab., MD;  Location: [facility];  Service: Gastroenterology;;  . [place]  . 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 UPPER GASTROINTESTINAL ENDOSCOPY  1983   hiatal hernia  . VASECTOMY  1979    There were no vitals filed for this visit.   Subjective Assessment - 06/26/20 0934    Subjective  Pt denies pain    Pertinent History subarachnoid hemorrhage (CT 04/30/20 showed moderate to large acute SAH; 05/11/20 MRI brain showed small acute infarcts in L basal ganglia, L periatrial temporal lobe and high bilateral frontoparietal regions).  PMH:  hx of  alcohol use with intoxication on hospital presentation, hypothyroidism, atrial tachycardia, Barrett's esophagus, hx of atrial tachycardia, osteopenia, thrombocytopenia, DJD, gout, melanoma of skin, bipolar disorder    Patient Stated Goals return to previous activities    Currently in Pain? No/denies                  Treatment:Calendar activity on constant therapy level 2 for attention to detail min v.c to initiate activity then pt completed with 100% accuracy 22.38 secs response time.  Reviewed red theraband exercises, 15 reps each, therapist provided min v.c to initiate task then pt returned with good performance and control. Pt/ wife report pt has difficulty logging in to my chart and deleting text messages.  Therapist assisted pt with performing these tasks and had pt write out the steps involved so that he can perform more independently at home. Pt to attempt to perform independently at home from written instructions.     Arm bike x6 mins level 4 for conditioning            OT Short Term Goals - 06/24/20 1024      OT SHORT TERM GOAL #1   Title Pt will be independent with HEP for bilateral shoulder strength.--check STGs 06/20/20    Time 4    Period Weeks    Status Achieved      OT SHORT TERM GOAL #2   Title Pt will be able to perform shower transfer with supervision.    Time 4    Period Weeks    Status Achieved   06/10/20:  met at this level     OT [place] #3   Title Pt will be able to perform simple alternating attention with at least 90% accuracy for incr safety.    Time 4    Period Weeks    Status On-going      OT SHORT TERM GOAL #4   Title Pt will perform mod complex functional problem solving with at least 90% accuracy.    Time 4    Period Weeks    Status On-going      OT SHORT TERM GOAL #5   Title Pt will verbalize understanding of cognitive compensation strategies for ADLs/IADLs.    Time 4    Period Weeks    Status On-going              OT Long Term Goals - 05/20/20 0925      OT LONG TERM GOAL #1   Title Pt will able to reach at least 10" bilaterally on standing functional reach test without cueing/impulsivity for improved safety for IADLs.    Time 8    Period Weeks    Status New      OT LONG TERM GOAL #2   Title Pt will demo good safety awareness for retrieving items from low shelf/floor mod I/without cueing.  Time 8    Period Weeks    Status New      OT LONG TERM GOAL #3   Title Pt will perform simple prior cooking/home maintenance tasks mod I and demonstrating good safety awareness/problem solving.    Time 8    Period Weeks    Status New      OT LONG TERM GOAL #4   Title Pt will perform simple divided attention with at least 1 physical and 1 cognitive tasks for improved safety.    Time 8    Period Weeks    Status New      OT LONG TERM GOAL #5   Title Pt will perform shower transfer mod I.    Time 8    Period Weeks    Status New                 Plan - 06/26/20 1013    Clinical Impression Statement Pt is progressing towards goals with improved IADL performance. Pt demonstrates good performance of red theraband exercises. Therapist had pt write out steps involved in logging in to my chart as this is a task that is challenging for him..    OT Occupational Profile and History Detailed Assessment- Review of Records and additional review of physical, cognitive, psychosocial history related to current functional performance    Occupational performance deficits (Please refer to evaluation for details): ADL's;IADL's    Body Structure / Function / Physical Skills ADL;Strength;Balance;IADL;Endurance;Mobility;Coordination;Decreased knowledge of precautions    Cognitive Skills Problem Solve;Safety Awareness;Attention;Memory;Sequencing    Rehab Potential Good    Clinical Decision Making Several treatment options, min-mod task modification necessary    Comorbidities Affecting Occupational Performance:  May have comorbidities impacting occupational performance    Modification or Assistance to Complete Evaluation  Min-Moderate modification of tasks or assist with assess necessary to complete eval    OT Frequency 2x / week    OT Duration 8 weeks   +eval or 17 total visits   OT Treatment/Interventions Self-care/ADL training;DME and/or AE instruction;Balance training;Therapeutic activities;Cognitive remediation/compensation;Therapeutic exercise;Neuromuscular education;Functional Mobility Training;Patient/family education;Manual Therapy;Passive range of motion;Energy conservation;Cryotherapy;Moist Heat    Plan finish checking STGs/continue with Cognitive Compensation Strategies; alternating attention, mod complex problem solving    Consulted and Agree with Plan of Care Patient;Family member/caregiver    Family Member Consulted wife           Patient will benefit from skilled therapeutic intervention in order to improve the following deficits and impairments:   Body Structure / Function / Physical Skills: ADL,Strength,Balance,IADL,Endurance,Mobility,Coordination,Decreased knowledge of precautions Cognitive Skills: Problem Solve,Safety Awareness,Attention,Memory,Sequencing     Visit Diagnosis: Other symptoms and signs involving cognitive functions following nontraumatic subarachnoid hemorrhage  Muscle weakness (generalized)  Unsteadiness on feet    Problem List Patient Active Problem List   Diagnosis Date Noted  . Dyslipidemia 05/23/2020  . Benign essential HTN 05/06/2020  . Subarachnoid hemorrhage ([place]) 04/30/2020  . Leukopenia 07/15/2018  . Thrombocytopenia ([place]) 07/15/2018  . PCP NOTES >>>>>>> 03/23/2015  . Annual physical exam 03/20/2015  . Bronchospasm 09/06/2014  . DJD (degenerative joint disease) 09/06/2014  . Hypothyroidism 08/28/2014  . Tremor 07/29/2014  . Gait disorder 07/29/2014  . Gout 01/08/2014  . Vitamin D deficiency 11/25/2012  . Melanoma of skin, site  unspecified 11/25/2012  . 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 Alcoholism in remission ([place]) 06/19/2011  . Diverticulitis of colon (without mention of hemorrhage)(562.11) 06/19/2011  . Bipolar I disorder ([place]) 06/19/2011  . Osteopenia 05/17/2010  .  Alcohol abuse 05/16/2010  . HYPERTRIGLYCERIDEMIA 08/02/2009  . Macrocytic anemia 01/10/2009  . ABNORMAL ELECTROCARDIOGRAM 11/26/2008  . REFLUX, ESOPHAGEAL 11/17/2006  . COMMON MIGRAINE 09/22/2006    RINE,KATHRYN 06/26/2020, 10:41 AM  [facility] [address] [place], [name], [address] Phone: [phone]   Fax:  [phone]  Name: Jeffrey Acosta MRN: [id number] Date of Birth: 1950/06/26

## 2020-06-27 NOTE — Therapy (Signed)
[facility] [address] [place], [name], [address] Phone: [phone]   Fax:  [phone]  Physical Therapy Treatment  Patient Details  Name: Jeffrey Acosta MRN: [id number] Date of Birth: April 08, 1951 Referring Provider (PT): Erlinda Hong, Fang/Dr. Kathlene November, PCP   Encounter Date: 06/26/2020     06/26/20 1017  PT Visits / Re-Eval  Visit Number 8  Number of Visits 18  Date for PT Re-Evaluation 08/18/20  Authorization  Authorization Type [employer] Medicare-$40 copay per discipline  PT Time Calculation  PT Start Time 1016  PT Stop Time 1057  PT Time Calculation (min) 41 min  PT - End of Session  Equipment Utilized During Treatment Gait belt  Activity Tolerance Patient tolerated treatment well  Behavior During Therapy [facility] for tasks assessed/performed     Past Medical History:  Diagnosis Date  . Alcoholism in remission ([place])    in [place]  . Arrhythmia    atrial tachycardia  . Bipolar affective disorder ([place])   . Diverticulosis of colon (without mention of hemorrhage)   . GERD (gastroesophageal reflux disease)   . Gout of multiple sites   . Hiatal hernia   . Hx of migraines   . Hydrocele   . HYPERLIPIDEMIA 01/10/2009   Qualifier: Diagnosis of  By: Ronnald Ramp CMA, [place]    NMR Lipoprofile 2011 LDL could not be calculated  Due to TG of 889  (811  / 750 ),  HDL 50 . Father MI @ 69 PGM CVA early 44s; PGF CVA _0    . Hypothyroidism   . Melanoma of skin, site unspecified 11/25/2012   07/2011 abdominal  Stage 1 Dr Amy [place] Dr Sarajane Jews   . Osteopenia   . Polyarthralgia     Past Surgical History:  Procedure Laterality Date  . BILIARY BRUSHING  12/07/2018   Procedure: BILIARY BRUSHING;  Surgeon: Rush Landmark Telford Nab., MD;  Location: [facility];  Service: Gastroenterology;;  . BILIARY DILATION  12/07/2018   Procedure: BILIARY DILATION;  Surgeon: Irving Copas., MD;  Location: [facility];  Service: Gastroenterology;;  . BIOPSY   12/07/2018   Procedure: BIOPSY;  Surgeon: Irving Copas., MD;  Location: [facility];  Service: Gastroenterology;;  . COLONOSCOPY  2003   negative ; Dr Sharlett Iles  . ERCP N/A 12/07/2018   Procedure: ENDOSCOPIC RETROGRADE CHOLANGIOPANCREATOGRAPHY (ERCP);  Surgeon: Irving Copas., MD;  Location: [facility];  Service: Gastroenterology;  Laterality: N/A;  . ESOPHAGOGASTRODUODENOSCOPY (EGD) WITH PROPOFOL N/A 12/07/2018   Procedure: ESOPHAGOGASTRODUODENOSCOPY (EGD) WITH PROPOFOL;  Surgeon: Rush Landmark Telford Nab., MD;  Location: [facility];  Service: Gastroenterology;  Laterality: N/A;  . EUS N/A 12/07/2018   Procedure: UPPER ENDOSCOPIC ULTRASOUND (EUS) RADIAL;  Surgeon: Rush Landmark Telford Nab., MD;  Location: [facility];  Service: Gastroenterology;  Laterality: N/A;  . INGUINAL HERNIA REPAIR  08/07/2011   Procedure: HERNIA REPAIR INGUINAL ADULT;  Surgeon: Rolm Bookbinder, MD;  Location: [facility];  Service: General;  Laterality: Right;  . IR ANGIO INTRA EXTRACRAN SEL INTERNAL CAROTID BILAT MOD SED  05/14/2020  . IR ANGIO VERTEBRAL SEL VERTEBRAL BILAT MOD SED  05/14/2020  . MELANOMA EXCISION  07/28/11   Dr Sarajane Jews; stomach and chest  . REMOVAL OF STONES  12/07/2018   Procedure: REMOVAL OF STONES;  Surgeon: Rush Landmark Telford Nab., MD;  Location: [facility];  Service: Gastroenterology;;  . [name]  1953  . 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 UPPER GASTROINTESTINAL ENDOSCOPY  1983   hiatal  hernia  . VASECTOMY  1979    There were no vitals filed for this visit.      06/26/20 1017  Symptoms/Limitations  Subjective No new complaints. No falls or pain to report  Patient is accompained by: Family member (spouse [name])  Patient Stated Goals Pt's goals for therapy are to be back to being independent and to get rid of walker.  Pain Assessment  Currently in Pain? No/denies  Pain Score 0       06/26/20 1018  Transfers  Transfers Sit to  Stand;Stand to Sit  Sit to Stand 6: Modified independent (Device/Increase time)  Stand to Sit 6: Modified independent (Device/Increase time)  Ambulation/Gait  Ambulation/Gait Yes  Ambulation/Gait Assistance 5: Supervision  Ambulation/Gait Assistance Details occasional right foot scuffing noted, no imbalance noted.  Ambulation Distance (Feet) 1000 Feet (x1, plus around gym with session)  Assistive device None  Gait Pattern Step-through pattern;Narrow base of support  Ambulation Surface Level;Unlevel;Indoor;Outdoor;Paved  Neuro Re-ed   Neuro Re-ed Details  for balance/muscle re-ed: in tall kneeling on red mat next to mat table- mini squats x 10 reps with emphasis on equal LE weight bearing/full return to tall kneeling with no UE support; side stepping left<>right for 3 laps, then forward/backwards for 3 laps with 1-2 UE support on mat. min guard assist with cues on posture and weight shifting.       06/26/20 1035  Balance Exercises: Standing  SLS with Vectors Foam/compliant surface;Other reps (comment);Limitations  SLS with Vectors Limitations on balance board in ant/posterior directions with 2 tall cones on floor in front- alternaring forward foot taps, cross foot taps, forward double foot taps and cross double foot taps for ~10 reps each. min guard to min assist with light to no UE support.  Rockerboard Anterior/posterior;Head turns;EC;30 seconds;Other reps (comment);Limitations  Rockerboard Limitations in ant/post direction only: rocking the board with EO, progressing to [facility] with emphasis on tall posture, min guard to min assist with no UE support. then holding the board steady- EC 30 sec's x 3 reps, progressing to EC head movements left<>right, up<>down for ~10 reps. min guard to min assist for balance.  Step Over Hurdles / Cones hurdles of varied heights over red/blue mats:forward reciprocal stepping for 6 laps with cues for increased hip/knee flexion with min guard assist and occasional  touch to counter for balance assist; then lateral stepping over hurdles of varied heights for 3 laps each right/left with min guard assist, occasional touch to counter.  Sit to Stand Standard surface;Without upper extremity support;Foam/compliant surface;Limitations  Sit to Stand Limitations seated with feet across red foam beam: sit<>stand for 10 reps with emphasis on tall standing, controlled sitting with min guard to min assist. no UE assist from low mat.         PT Short Term Goals - 06/20/20 0935      PT SHORT TERM GOAL #1   Title Pt will be independent with HEP for improved strength, balance, transfers, and gait.  06/21/2020    Baseline 06/20/20: met with current program. needs to be updated as current progam is easy per pt report    Time --    Period --    Status Achieved      PT SHORT TERM GOAL #2   Title Pt will improve 5x sit<>stand to less than or equal to 15 sec to demonstrate improved functional strength and transfer efficiency.    Baseline 06/20/20: 11.96 sec's no UE support form standard height surface  Time --    Period --    Status Achieved      PT SHORT TERM GOAL #3   Title Pt will improve TUG score to less than or equal to 13.5 sec for decreased fall risk.    Baseline 06/20/20; 8.91 sec's no device    Time --    Period --    Status Achieved      PT SHORT TERM GOAL #4   Title Pt will improve Berg score to at least 47/56 for decreased fall risk.    Baseline 06/20/20: 53/56 scored today    Time --    Period --    Status Achieved      PT SHORT TERM GOAL #5   Title Pt will ambulate short distances, 50-100 ft with SPC with supervision, for improved transition to more independent household gait.    Baseline 06/20/20: met with no device    Time --    Period --    Status Achieved             PT Long Term Goals - 06/21/20 0912      PT LONG TERM GOAL #1   Title Pt will be independent with progression of HEP for improved strength, balance, transfers, and gait.   TARGET 07/19/2020    Time 9    Period Weeks    Status On-going      PT LONG TERM GOAL #2   Title Pt will improve 5x sit<>stand to less than or equal to 12.5 sec to demonstrate improved functional strength and transfer efficiency.    Baseline 06/20/20: 11.96 sec's no UE support from standard height surface    Status Achieved      PT LONG TERM GOAL #3   Title Pt will improve gait velocity to at least 2.62 ft/sec for improved gait efficiency and safety.    Baseline 06/20/20: 3.66 ft/sec no AD    Status Achieved      PT LONG TERM GOAL #4   Title Pt will be mod independent with outdoor gait, at least 1000 ft, using cane versus no device, for improved community gait.    Time 9    Period Weeks    Status On-going      PT LONG TERM GOAL #5   Title Pt will verbalize understanding of fall prevention in home environment.    Time 9    Period Weeks    Status On-going            06/26/20 1017  Plan  Clinical Impression Statement Today's skilled session continued to focus on gait on various surfaces and balance training on compliant surfaces. No issues noted or reported in session. The pt is making steady progress toward goals and should benefit from continued PT to progress toward unmet goals.  Personal Factors and Comorbidities Comorbidity 3+  Comorbidities See notes  Examination-Activity Limitations Locomotion Level;Transfers;Stand  Examination-Participation Restrictions Shop;Community Activity  Pt will benefit from skilled therapeutic intervention in order to improve on the following deficits Abnormal gait;Difficulty walking;Decreased safety awareness;Decreased balance;Decreased mobility;Decreased strength  Stability/Clinical Decision Making Evolving/Moderate complexity  Rehab Potential Good  PT Frequency 2x / week  PT Duration Other (comment) (for 8 weeks, plus 1 additional visit week of eval)  PT Treatment/Interventions ADLs/Self Care Home Management;DME Instruction;Neuromuscular  re-education;Balance training;Therapeutic exercise;Therapeutic activities;Functional mobility training;Stair training;Gait training;Patient/family education  PT Next Visit Plan continue to work on strengthening, dynamic gait and balance reactions. add to HEP as needed.  PT Home Exercise  Plan Access Code: [email or web address]  Consulted and Agree with Plan of Care Patient;Family member/caregiver  Family Member Consulted wife-[name]          Patient will benefit from skilled therapeutic intervention in order to improve the following deficits and impairments:  Abnormal gait,Difficulty walking,Decreased safety awareness,Decreased balance,Decreased mobility,Decreased strength  Visit Diagnosis: Other abnormalities of gait and mobility  Unsteadiness on feet  Muscle weakness (generalized)     Problem List Patient Active Problem List   Diagnosis Date Noted  . Dyslipidemia 05/23/2020  . Benign essential HTN 05/06/2020  . Subarachnoid hemorrhage ([place]) 04/30/2020  . Leukopenia 07/15/2018  . Thrombocytopenia ([place]) 07/15/2018  . PCP NOTES >>>>>>> 03/23/2015  . Annual physical exam 03/20/2015  . Bronchospasm 09/06/2014  . DJD (degenerative joint disease) 09/06/2014  . Hypothyroidism 08/28/2014  . Tremor 07/29/2014  . Gait disorder 07/29/2014  . Gout 01/08/2014  . Vitamin D deficiency 11/25/2012  . Melanoma of skin, site unspecified 11/25/2012  . 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 Alcoholism in remission ([place]) 06/19/2011  . Diverticulitis of colon (without mention of hemorrhage)(562.11) 06/19/2011  . Bipolar I disorder ([place]) 06/19/2011  . Osteopenia 05/17/2010  . Alcohol abuse 05/16/2010  . HYPERTRIGLYCERIDEMIA 08/02/2009  . Macrocytic anemia 01/10/2009  . ABNORMAL ELECTROCARDIOGRAM 11/26/2008  . REFLUX, ESOPHAGEAL 11/17/2006  . COMMON MIGRAINE 09/22/2006    Willow Ora, PTA, [facility] [address] [place], [place]  [id number] [phone] 06/27/20, 7:36 PM   Name: ENDER RORKE MRN: [id number] Date of Birth: [birth year]

## 2020-06-30 NOTE — Progress Notes (Signed)
Cardiology Office Note:   Date:  07/03/2020  NAME:  Jeffrey Acosta    MRN: [id number] DOB:  [birth year]   PCP:  Colon Branch, MD  Cardiologist:  Evalina Field, MD   Referring MD: Colon Branch, MD   Chief Complaint  Patient presents with  . Follow-up   History of Present Illness:   Jeffrey Acosta is a 70 y.o. male with a hx of atrial tachycardia, HLD, etoh abuse, SAH who presents for follow-up. Recent admission 04/30/2020 for [facility] 2/2 aneurysm rupture and alcohol intoxication. Had another syncopal event while in hospital with stroke attributed to vasospasm. Statin stopped due to [place].  Recently evaluated by neurology.  They feel his second stroke may have been related to atrial fibrillation.  Interestingly he was admitted with a subarachnoid hemorrhage in the setting of traumatic fall.  He had no mention of any cardioembolic distribution of stroke on the initial scan.  The second scan did show this.  He was on the monitor the entire time in the hospital.  There was no evidence of atrial fibrillation on the monitor.  I did review his monitor in the office.  It showed frequent atrial tachycardia episodes.  No A. fib was identified.  He reports no symptoms from this.  His symptoms have improved dramatically on metoprolol succinate.  His echo showed normal function in the hospital.  He reports no major symptoms.  He is recovered from his stroke.  He is completed physical therapy.  He denies any neurological deficits.  He is having no chest pain or shortness of breath.  He reports he did drink some alcohol in the setting of this fall.  He is trying to refrain from alcohol.  His statin was stopped.  I need to reach out to his neurologist to determine when it is okay to restart.  They also discussed aspirin versus Eliquis but has had no evidence of A. fib that I can tell.  I will reach out to his neurologist to discuss what the plan is for a statin as well if he needs extended monitoring for atrial fibrillation.   To me this looks like a traumatic fall with a traumatic bleed.  Problem List 1. Atrial tachycardia  2. HLD -T chol 119, HDL 50, LDL 54, TG 75 3. Etoh abuse 4. Subarachnoid hemorrhage 04/30/2020 -trauma/fall and etoh use  -vasospasm of L PCA on angiogram ->21 days nimodipine  5. Tobacco abuse   Past Medical History: Past Medical History:  Diagnosis Date  . Alcoholism in remission ([place])    in [place]  . Arrhythmia    atrial tachycardia  . Bipolar affective disorder ([place])   . Diverticulosis of colon (without mention of hemorrhage)   . GERD (gastroesophageal reflux disease)   . Gout of multiple sites   . Hiatal hernia   . Hx of migraines   . Hydrocele   . HYPERLIPIDEMIA 01/10/2009   Qualifier: Diagnosis of  By: Ronnald Ramp CMA, Chemira    NMR Lipoprofile 2011 LDL could not be calculated  Due to TG of 889  (811  / 750 ),  HDL 50 . Father MI @ 41 PGM CVA early 35s; PGF CVA @72    . Hypothyroidism   . Melanoma of skin, site unspecified 11/25/2012   07/2011 abdominal  Stage 1 Dr Amy Martinique Dr Sarajane Jews   . Osteopenia   . Polyarthralgia   . Stroke [facility])     Past Surgical History: Past Surgical History:  Procedure  Laterality Date  . BILIARY BRUSHING  12/07/2018   Procedure: BILIARY BRUSHING;  Surgeon: Rush Landmark Telford Nab., MD;  Location: [facility];  Service: Gastroenterology;;  . BILIARY DILATION  12/07/2018   Procedure: BILIARY DILATION;  Surgeon: Irving Copas., MD;  Location: [facility];  Service: Gastroenterology;;  . BIOPSY  12/07/2018   Procedure: BIOPSY;  Surgeon: Irving Copas., MD;  Location: [facility];  Service: Gastroenterology;;  . COLONOSCOPY  2003   negative ; Dr Sharlett Iles  . ERCP N/A 12/07/2018   Procedure: ENDOSCOPIC RETROGRADE CHOLANGIOPANCREATOGRAPHY (ERCP);  Surgeon: Irving Copas., MD;  Location: [facility];  Service: Gastroenterology;  Laterality: N/A;  . ESOPHAGOGASTRODUODENOSCOPY (EGD) WITH PROPOFOL N/A 12/07/2018   Procedure:  ESOPHAGOGASTRODUODENOSCOPY (EGD) WITH PROPOFOL;  Surgeon: Rush Landmark Telford Nab., MD;  Location: [facility];  Service: Gastroenterology;  Laterality: N/A;  . EUS N/A 12/07/2018   Procedure: UPPER ENDOSCOPIC ULTRASOUND (EUS) RADIAL;  Surgeon: Rush Landmark Telford Nab., MD;  Location: [facility];  Service: Gastroenterology;  Laterality: N/A;  . INGUINAL HERNIA REPAIR  08/07/2011   Procedure: HERNIA REPAIR INGUINAL ADULT;  Surgeon: Rolm Bookbinder, MD;  Location: [facility];  Service: General;  Laterality: Right;  . IR ANGIO INTRA EXTRACRAN SEL INTERNAL CAROTID BILAT MOD SED  05/14/2020  . IR ANGIO VERTEBRAL SEL VERTEBRAL BILAT MOD SED  05/14/2020  . MELANOMA EXCISION  07/28/11   Dr Sarajane Jews; stomach and chest  . REMOVAL OF STONES  12/07/2018   Procedure: REMOVAL OF STONES;  Surgeon: Rush Landmark Telford Nab., MD;  Location: [facility];  Service: Gastroenterology;;  . [name]  1951  . 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 UPPER GASTROINTESTINAL ENDOSCOPY  1983   hiatal hernia  . VASECTOMY  1979   Current Medications: Current Meds  Medication Sig  . allopurinol (ZYLOPRIM) 300 MG tablet Take 1 tablet (300 mg total) by mouth daily.  Marland Kitchen aspirin EC 81 MG tablet Take 1 tablet (81 mg total) by mouth daily. Swallow whole.  . b complex vitamins capsule Take 1 capsule by mouth daily.  . Cholecalciferol (VITAMIN D) 50 MCG (2000 UT) tablet Take 2,000 Units by mouth daily.  Marland Kitchen esomeprazole (NEXIUM) 40 MG capsule Take 1 capsule (40 mg total) by mouth daily before breakfast.  . folic acid (FOLVITE) 1 MG tablet Take 1 tablet (1 mg total) by mouth daily.  Marland Kitchen lamoTRIgine (LAMICTAL) 25 MG tablet Take 25 mg by mouth daily.  Marland Kitchen levothyroxine (SYNTHROID) 50 MCG tablet Take 1 tablet (50 mcg total) by mouth daily before breakfast.  . metoprolol succinate (TOPROL-XL) 100 MG 24 hr tablet Take 1 tablet (100 mg total) by mouth daily. Take with or immediately following a meal.  Start taking  from 2/8  . Multiple Vitamin (MULTIVITAMIN WITH MINERALS) TABS tablet Take 1 tablet by mouth daily.  . naltrexone (DEPADE) 50 MG tablet Take 50 mg by mouth daily.   . QUEtiapine (SEROQUEL) 400 MG tablet Take 800 mg by mouth at bedtime.   . tamsulosin (FLOMAX) 0.4 MG CAPS capsule Take 1 capsule (0.4 mg total) by mouth daily after supper.  . Thiamine HCl (VITAMIN B-1) 250 MG tablet Take 1 tablet (250 mg total) by mouth daily.  . timolol (TIMOPTIC) 0.5 % ophthalmic solution 1 drop 2 (two) times daily.  . vitamin B-12 (CYANOCOBALAMIN) 500 MCG tablet Take 500 mcg by mouth daily.     Allergies:    Omnipaque [iohexol]   Social History: Social History   Socioeconomic History  .  Marital status: Married    Spouse name: [name]  . Number of children: 3  . Years of education: Not on file  . Highest education level: Not on file  Occupational History  . Occupation: retired  Tobacco Use  . Smoking status: Never Smoker  . Smokeless tobacco: Never Used  Vaping Use  . Vaping Use: Never used  Substance and Sexual Activity  . Alcohol use: Not Currently    Alcohol/week: 0.0 standard drinks    Comment: pt reports periodic alcohol  . Drug use: No  . Sexual activity: Yes    Partners: Female    Comment: Having some issues with ED.  Would like provider's recommendation.    Other Topics Concern  . Not on file  Social History Narrative   Lives with Wife, [name]   Right Handed   Drinks 4-5 cups caffeine/week   Social Determinants of Health   Financial Resource Strain: Not on file  Food Insecurity: Not on file  Transportation Needs: Not on file  Physical Activity: Not on file  Stress: Not on file  Social Connections: Not on file    Family History: The patient's family history includes COPD in his father and mother; Diabetes in his paternal grandmother; Heart attack (age of onset: 35) in his father; Lung cancer in his paternal grandfather; Parkinsonism in his paternal grandmother; Stroke (age of  onset: 58) in his paternal grandmother; Stroke (age of onset: 47) in his maternal grandfather. There is no history of Colon cancer or Prostate cancer.  ROS:   All other ROS reviewed and negative. Pertinent positives noted in the HPI.     EKGs/Labs/Other Studies Reviewed:   The following studies were personally reviewed by me today:   Zio 05/17/2020 1. Frequent ectopic atrial tachycardia episodes detected (340 in 12 days; longest 34.1 seconds).  2. Occasional PACs (3.2% burden). 3. Occasional PVCs (1.3% burden).   Recent Labs: 05/01/2020: TSH 1.104 05/16/2020: Magnesium 1.7 05/22/2020: ALT 15; BUN 19; Creatinine, Ser 1.19; Hemoglobin 12.7; Platelets 327.0; Potassium 4.6; Sodium 140   Recent Lipid Panel    Component Value Date/Time   CHOL 108 05/13/2020 0100   TRIG 131 05/13/2020 0100   HDL 59 05/13/2020 0100   CHOLHDL 1.8 05/13/2020 0100   VLDL 26 05/13/2020 0100   LDLCALC 23 05/13/2020 0100   LDLDIRECT 41.0 03/14/2020 0850    Physical Exam:   VS:  BP 130/64   Pulse (!) 42   Ht 6\' 1"  (1.854 m)   Wt 209 lb (94.8 kg)   SpO2 96%   BMI 27.57 kg/m    Wt Readings from Last 3 Encounters:  07/03/20 209 lb (94.8 kg)  07/02/20 208 lb 6.4 oz (94.5 kg)  05/22/20 190 lb 2 oz (86.2 kg)    General: Well nourished, well developed, in no acute distress Head: Atraumatic, normal size  Eyes: PEERLA, EOMI  Neck: Supple, no JVD Endocrine: No thryomegaly Cardiac: Normal S1, S2; RRR; no murmurs, rubs, or gallops Lungs: Clear to auscultation bilaterally, no wheezing, rhonchi or rales  Abd: Soft, nontender, no hepatomegaly  Ext: No edema, pulses 2+ Musculoskeletal: No deformities, BUE and BLE strength normal and equal Skin: Warm and dry, no rashes   Neuro: Alert and oriented to person, place, time, and situation, CNII-XII grossly intact, no focal deficits  Psych: Normal mood and affect   ASSESSMENT:   Jeffrey Acosta is a 70 y.o. male who presents for the following: 1. Syncope,  unspecified syncope type   2.  Atrial tachycardia ([place])   3. Mixed hyperlipidemia     PLAN:   1. Syncope, unspecified syncope type -Recent admission for syncope.  This was the setting of alcohol abuse and traumatic subarachnoid hemorrhage.  He was then admitted and had a recurrent episode possibly attributable to vasospasm.  Neurology thinks he possibly had occult atrial fibrillation.  Recent monitor shows no A. fib.  He was in the hospital and had no evidence of atrial fibrillation during that hospitalization. -I will reach out to neurology to determine how strongly feel about looking for atrial fibrillation.  If they do he will need extended monitoring with a loop recorder.  2. Atrial tachycardia (H[facility]) -Frequent episodes of atrial tachycardia on recent heart monitor.  I think these episodes were exacerbated by his recent stroke and recovery.  He reports no symptoms.  Continue metoprolol succinate 25 mg daily.  3. Mixed hyperlipidemia -Most recent LDL cholesterol 23.  His statin was stopped in the setting of intracerebral hemorrhage.  We will reach out to neurology about when will be safe to restart this.  Disposition: Return in about 6 months (around 01/03/2021).  Medication Adjustments/Labs and Tests Ordered: Current medicines are reviewed at length with the patient today.  Concerns regarding medicines are outlined above.  No orders of the defined types were placed in this encounter.  No orders of the defined types were placed in this encounter.   Patient Instructions  Medication Instructions:  The current medical regimen is effective;  continue present plan and medications.  *If you need a refill on your cardiac medications before your next appointment, please call your pharmacy*   Follow-Up: At [facility], you and your health needs are our priority.  As part of our continuing mission to provide you with exceptional heart care, we have created designated Provider Care Teams.   These Care Teams include your primary Cardiologist (physician) and Advanced Practice Providers (APPs -  Physician Assistants and Nurse Practitioners) who all work together to provide you with the care you need, when you need it.  We recommend signing up for the patient portal called "MyChart".  Sign up information is provided on this After Visit Summary.  MyChart is used to connect with patients for Virtual Visits (Telemedicine).  Patients are able to view lab/test results, encounter notes, upcoming appointments, etc.  Non-urgent messages can be sent to your provider as well.   To learn more about what you can do with MyChart, go to [email or web address].    Your next appointment:   6 month(s)  The format for your next appointment:   In Person  Provider:   Eleonore Chiquito, MD         Time Spent with Patient: I have spent a total of 25 minutes with patient reviewing hospital notes, telemetry, EKGs, labs and examining the patient as well as establishing an assessment and plan that was discussed with the patient.  > 50% of time was spent in direct patient care.  Signed, Addison Naegeli. Audie Box, MD, [employer]  [address], [place], [place] [address] [phone]  07/03/2020 9:34 AM

## 2020-07-01 ENCOUNTER — Ambulatory Visit: Payer: Medicare Other | Admitting: Physical Therapy

## 2020-07-01 ENCOUNTER — Ambulatory Visit: Payer: Medicare Other | Admitting: Occupational Therapy

## 2020-07-01 ENCOUNTER — Other Ambulatory Visit: Payer: Self-pay

## 2020-07-01 ENCOUNTER — Ambulatory Visit: Payer: Medicare Other

## 2020-07-01 DIAGNOSIS — I69018 Other symptoms and signs involving cognitive functions following nontraumatic subarachnoid hemorrhage: Secondary | ICD-10-CM | POA: Diagnosis not present

## 2020-07-01 DIAGNOSIS — R2681 Unsteadiness on feet: Secondary | ICD-10-CM | POA: Diagnosis not present

## 2020-07-01 DIAGNOSIS — R2689 Other abnormalities of gait and mobility: Secondary | ICD-10-CM | POA: Diagnosis not present

## 2020-07-01 DIAGNOSIS — M6281 Muscle weakness (generalized): Secondary | ICD-10-CM | POA: Diagnosis not present

## 2020-07-02 ENCOUNTER — Encounter: Payer: Self-pay | Admitting: Neurology

## 2020-07-02 ENCOUNTER — Ambulatory Visit: Payer: Medicare Other | Admitting: Neurology

## 2020-07-02 VITALS — BP 160/88 | HR 63 | Ht 73.0 in | Wt 208.4 lb

## 2020-07-02 DIAGNOSIS — I6310 Cerebral infarction due to embolism of unspecified precerebral artery: Secondary | ICD-10-CM

## 2020-07-02 DIAGNOSIS — I482 Chronic atrial fibrillation, unspecified: Secondary | ICD-10-CM

## 2020-07-02 DIAGNOSIS — I609 Nontraumatic subarachnoid hemorrhage, unspecified: Secondary | ICD-10-CM

## 2020-07-02 MED ORDER — ASPIRIN EC 81 MG PO TBEC: 81.0000 mg | DELAYED_RELEASE_TABLET | Freq: Every day | ORAL | 11 refills | Status: DC

## 2020-07-02 NOTE — Progress Notes (Signed)
[facility] [address] [place]. [name] [address] [phone]       OFFICE FOLLOW-UP NOTE  Jeffrey Acosta Date of Birth:  08-07-1950 Medical Record Number:  [id number]   HPI: Mr. Rohrman is a 70 year old Caucasian male seen today for initial office follow-up visit following hospital admission for subarachnoid hemorrhage in January 2022.  History is obtained from the patient and his wife is accompanying him today as well as review of electronic medical records and personally reviewed pertinent imaging films in PACS.  He has past medical history of bipolar disorder, hypothyroidism, diverticulosis, gastroesophageal reflux disease, hyperlipidemia, history of pia, atrial fibrillation and alcohol and tobacco abuse.  He was admitted on 04/30/2020 to [facility] when he was found to collapsed suddenly at home.  CT scan of the head showed moderate to large amount of acute subarachnoid hemorrhage felt to be consistent with a ruptured aneurysm with small amount of bilateral intraventricular hemorrhage.  CT angiogram was negative for aneurysm or AV malformation.  Patient was managed conservatively and was doing well until 05/11/2020 when he wandered off from the inpatient room for approximately 2 hours.  Patient later reported he had gone to smoke a cigarette but he was found unresponsive and hypothermic outside on the hospital campus.  CT scan of the head showed further regression of the bilateral extra-axial hemorrhage with resolved intraventricular blood.  The patient's family subsequently had reported that the previous day they noticed change in mental status with aphasia and inattention to conversation and decreased responsiveness.  MRI scan of the brain was obtained which showed multiple small acute infarcts in the left basal ganglia, left periatrial temporal lobe, bilateral frontoparietal regions with no associated edema or mass-effect.  EEG showed moderate diffuse encephalopathy  nonspecific in etiology without seizures.  2D echo showed ejection fraction of 55 to 60%.  SARS coronavirus test was negative.  LDL was low at 23 mg percent and hemoglobin A1c was 3.9.  Urine drug screen was negative.  Patient was found to have temperature and elevated white count and started on Zosyn and vancomycin.  He was started on nimodipine for vasospasm prophylaxis.  He underwent diagnostic cerebral catheter angiogram by Dr. Kathyrn Sheriff which was negative for any aneurysm or AV malformation.  Patient informs me that he has atrial fibrillation but was only on aspirin prior to admission.  However I do not find documentation of this in the hospital records.  He had a Zio patch as an outpatient and he has a appointment to discuss results with cardiologist Dr. Audie Box later this week.  Review of his previous physician office visits mention atrial tachycardia diagnosed in December 2020 but no definite mention of A. fib was made.  Patient states is done well since discharge to home._Outpatient physical occupational speech therapy.  His speech is mostly back to normal though still has some occasional word hesitancy and word finding difficulties.  He wants to start driving.  Is fully independent in all activities of daily living.  He has no complaints.  ROS:   14 system review of systems is positive for headache, altered consciousness, aphasia, weakness, confusion, word finding difficulties all other systems negative  PMH:  Past Medical History:  Diagnosis Date  . Alcoholism in remission ([place])    in [place]  . Arrhythmia    atrial tachycardia  . Bipolar affective disorder ([place])   . Diverticulosis of colon (without mention of hemorrhage)   . GERD (gastroesophageal reflux disease)   .  Gout of multiple sites   . Hiatal hernia   . Hx of migraines   . Hydrocele   . HYPERLIPIDEMIA 01/10/2009   Qualifier: Diagnosis of  By: Ronnald Ramp CMA, Chemira    NMR Lipoprofile 2011 LDL could not be calculated  Due to TG of 889   (811  / 750 ),  HDL 50 . Father MI @ 72 PGM CVA early 77s; PGF CVA @72    . Hypothyroidism   . Melanoma of skin, site unspecified 11/25/2012   07/2011 abdominal  Stage 1 Dr Amy Martinique Dr Sarajane Jews   . Osteopenia   . Polyarthralgia   . Stroke [facility])     Social History:  Social History   Socioeconomic History  . Marital status: Married    Spouse name: [name]  . Number of children: 3  . Years of education: Not on file  . Highest education level: Not on file  Occupational History  . Occupation: retired  Tobacco Use  . Smoking status: Never Smoker  . Smokeless tobacco: Never Used  Vaping Use  . Vaping Use: Never used  Substance and Sexual Activity  . Alcohol use: Not Currently    Alcohol/week: 0.0 standard drinks    Comment: pt reports periodic alcohol  . Drug use: No  . Sexual activity: Yes    Partners: Female    Comment: Having some issues with ED.  Would like provider's recommendation.    Other Topics Concern  . Not on file  Social History Narrative   Lives with Wife, [name]   Right Handed   Drinks 4-5 cups caffeine/week   Social Determinants of Health   Financial Resource Strain: Not on file  Food Insecurity: Not on file  Transportation Needs: Not on file  Physical Activity: Not on file  Stress: Not on file  Social Connections: Not on file  Intimate Partner Violence: Not on file    Medications:   Current Outpatient Medications on File Prior to Visit  Medication Sig Dispense Refill  . allopurinol (ZYLOPRIM) 300 MG tablet Take 1 tablet (300 mg total) by mouth daily. 90 tablet 3  . b complex vitamins capsule Take 1 capsule by mouth daily.    . Cholecalciferol (VITAMIN D) 50 MCG (2000 UT) tablet Take 2,000 Units by mouth daily.    Marland Kitchen esomeprazole (NEXIUM) 40 MG capsule Take 1 capsule (40 mg total) by mouth daily before breakfast. 90 capsule 3  . folic acid (FOLVITE) 1 MG tablet Take 1 tablet (1 mg total) by mouth daily. 90 tablet 3  . lamoTRIgine (LAMICTAL) 25 MG tablet Take  25 mg by mouth daily.    Marland Kitchen levothyroxine (SYNTHROID) 50 MCG tablet Take 1 tablet (50 mcg total) by mouth daily before breakfast. 90 tablet 0  . metoprolol succinate (TOPROL-XL) 100 MG 24 hr tablet Take 1 tablet (100 mg total) by mouth daily. Take with or immediately following a meal.  Start taking from 2/8    . Multiple Vitamin (MULTIVITAMIN WITH MINERALS) TABS tablet Take 1 tablet by mouth daily.    . naltrexone (DEPADE) 50 MG tablet Take 50 mg by mouth daily.   12  . QUEtiapine (SEROQUEL) 400 MG tablet Take 800 mg by mouth at bedtime.     . tamsulosin (FLOMAX) 0.4 MG CAPS capsule Take 1 capsule (0.4 mg total) by mouth daily after supper. 30 capsule 3  . Thiamine HCl (VITAMIN B-1) 250 MG tablet Take 1 tablet (250 mg total) by mouth daily. 90 tablet 3  .  timolol (TIMOPTIC) 0.5 % ophthalmic solution 1 drop 2 (two) times daily.    . vitamin B-12 (CYANOCOBALAMIN) 500 MCG tablet Take 500 mcg by mouth daily.    . metoprolol succinate (TOPROL-XL) 25 MG 24 hr tablet Take 1 tablet (25 mg total) by mouth daily for 18 days. 18 tablet 0  . niMODipine (NIMOTOP) 30 MG capsule Take 2 capsules (60 mg total) by mouth every 4 (four) hours for 18 days. 108 capsule 0   No current facility-administered medications on file prior to visit.    Allergies:   Allergies  Allergen Reactions  . Omnipaque [Iohexol] Hives     Desc: developed hives after injection; resolved with 50 mg benadryl given to pt.     Physical Exam General: well developed, well nourished elderly Caucasian male, seated, in no evident distress Head: head normocephalic and atraumatic.  Neck: supple with no carotid or supraclavicular bruits Cardiovascular: regular rate and rhythm, no murmurs Musculoskeletal: no deformity Skin:  no rash/petichiae Vascular:  Normal pulses all extremities Vitals:   07/02/20 1450  BP: (!) 160/88  Pulse: 63   Neurologic Exam Mental Status: Awake and fully alert. Oriented to place and time. Recent and remote  memory intact. Attention span, concentration and fund of knowledge appropriate. Mood and affect appropriate.  Cranial Nerves: Fundoscopic exam reveals sharp disc margins. Pupils equal, briskly reactive to light. Extraocular movements full without nystagmus. Visual fields full to confrontation. Hearing intact. Facial sensation intact. Face, tongue, palate moves normally and symmetrically.  Motor: Normal bulk and tone. Normal strength in all tested extremity muscles. Sensory.: intact to touch ,pinprick .position and vibratory sensation.  Coordination: Rapid alternating movements normal in all extremities. Finger-to-nose and heel-to-shin performed accurately bilaterally. Gait and Station: Arises from chair without difficulty. Stance is normal. Gait demonstrates normal stride length and balance . Able to heel, toe and tandem walk with moderate difficulty.  Reflexes: 1+ and symmetric. Toes downgoing.   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  Doubt significant vasospasm contributing as it was not clearly documented on transcranial Doppler study and many of the infarcts were subcortical.  Patient may have occult atrial fibrillation and has undergone outpatient cardiac monitoring the results are awaited.  Patient has done well with near complete recovery.     PLAN: I had a long discussion with the patient and his wife regarding his recent subarachnoid hemorrhage as well as bicerebral embolic infarcts likely from occult A. fib.  He remains at recurrent risk for strokes from his A. fib but is also at high risk for hemorrhage on anticoagulants given his recent intracranial bleeding.  Recommend he start taking aspirin 81 mg daily or consider participation in the ASPIRE stroke prevention trial ( aspirin versus  Eliquis ) if interested and if atrial fibrillation is documented.Marland Kitchen  He will be given written information to review and decide.  I also recommend aggressive risk factor modification with strict control of hypertension with blood pressure goal below 130/90, lipids with LDL cholesterol goal below 70 mg percent and diabetes with hemoglobin A1c goal below 6.5%.  He was encouraged to eat a healthy diet with lots of fruits, vegetables, cereals, whole grains and to be active and exercise regularly and not gain weight.  Return for follow-up in future in 3 months or call earlier if necessary. Greater than 50% of time during  this 35 minute visit was spent on counseling,explanation of diagnosis of subarachnoid hemorrhage, atrial fibrillation and stroke risk, planning of further management, discussion with patient and family and coordination of care Antony Contras, MD Note: This document was prepared with digital dictation and possible smart phrase technology. Any transcriptional errors that result from this process are unintentional

## 2020-07-02 NOTE — Therapy (Signed)
[name], [name], [address] Phone: [phone]   Fax:  [phone]  Physical Therapy Treatment & Discharge Summary  Patient Details  Name: Jeffrey Acosta MRN: [id number] Date of Birth: 02-Sep-1950 Referring Provider (PT): Erlinda Hong, Fang/Dr. Kathlene November, PCP   Encounter Date: 07/01/2020   PT End of Session - 07/02/20 2041    Visit Number 9    Number of Visits 18    Date for PT Re-Evaluation 08/18/20    Authorization Type BC[employer] Medicare-$40 copay per discipline    PT Start Time 0933    PT Stop Time 1016    PT Time Calculation (min) 43 min    Activity Tolerance Patient tolerated treatment well    Behavior During Therapy [facility] for tasks assessed/performed           Past Medical History:  Diagnosis Date  . Alcoholism in remission ([place])    in [place]  . Arrhythmia    atrial tachycardia  . Bipolar affective disorder ([place])   . Diverticulosis of colon (without mention of hemorrhage)   . GERD (gastroesophageal reflux disease)   . Gout of multiple sites   . Hiatal hernia   . Hx of migraines   . Hydrocele   . HYPERLIPIDEMIA 01/10/2009   Qualifier: Diagnosis of  By: Ronnald Ramp CMA, [place]    NMR Lipoprofile 2011 LDL could not be calculated  Due to TG of 889  (811  / 750 ),  HDL 50 . Father MI @ 38 PGM CVA early 12s; PGF CVA '@72'    . Hypothyroidism   . Melanoma of skin, site unspecified 11/25/2012   07/2011 abdominal  Stage 1 Dr Amy [place] Dr Sarajane Jews   . Osteopenia   . Polyarthralgia   . Stroke [facility])     Past Surgical History:  Procedure Laterality Date  . BILIARY BRUSHING  12/07/2018   Procedure: BILIARY BRUSHING;  Surgeon: Rush Landmark Telford Nab., MD;  Location: [facility];  Service: Gastroenterology;;  . BILIARY DILATION  12/07/2018   Procedure: BILIARY DILATION;  Surgeon: Irving Copas., MD;  Location: [facility];  Service: Gastroenterology;;  . BIOPSY  12/07/2018   Procedure: BIOPSY;  Surgeon:  Irving Copas., MD;  Location: [facility];  Service: Gastroenterology;;  . COLONOSCOPY  2003   negative ; Dr Sharlett Iles  . ERCP N/A 12/07/2018   Procedure: ENDOSCOPIC RETROGRADE CHOLANGIOPANCREATOGRAPHY (ERCP);  Surgeon: Irving Copas., MD;  Location: [facility];  Service: Gastroenterology;  Laterality: N/A;  . ESOPHAGOGASTRODUODENOSCOPY (EGD) WITH PROPOFOL N/A 12/07/2018   Procedure: ESOPHAGOGASTRODUODENOSCOPY (EGD) WITH PROPOFOL;  Surgeon: Rush Landmark Telford Nab., MD;  Location: [facility];  Service: Gastroenterology;  Laterality: N/A;  . EUS N/A 12/07/2018   Procedure: UPPER ENDOSCOPIC ULTRASOUND (EUS) RADIAL;  Surgeon: Rush Landmark Telford Nab., MD;  Location: [facility];  Service: Gastroenterology;  Laterality: N/A;  . INGUINAL HERNIA REPAIR  08/07/2011   Procedure: HERNIA REPAIR INGUINAL ADULT;  Surgeon: Rolm Bookbinder, MD;  Location: [place];  Service: General;  Laterality: Right;  . IR ANGIO INTRA EXTRACRAN SEL INTERNAL CAROTID BILAT MOD SED  05/14/2020  . IR ANGIO VERTEBRAL SEL VERTEBRAL BILAT MOD SED  05/14/2020  . MELANOMA EXCISION  07/28/11   Dr Sarajane Jews; stomach and chest  . REMOVAL OF STONES  12/07/2018   Procedure: REMOVAL OF STONES;  Surgeon: Rush Landmark Telford Nab., MD;  Location: [facility];  Service: Gastroenterology;;  . [name]  1953  . 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 UPPER GASTROINTESTINAL ENDOSCOPY  1983   hiatal hernia  . VASECTOMY  1979    There were no vitals filed for this visit.   Subjective Assessment - 07/02/20 2033    Subjective Pt reports he is doing well - reports not having trouble doing anything physical at home - feels ready to discharge today    Patient is accompained by: Family member   spouse Jeffrey Acosta   Patient Stated Goals Pt's goals for therapy are to be back to being independent and to get rid of walker.    Currently in Pain? No/denies                              [facility] Adult PT Treatment/Exercise - 07/02/20 0001      Ambulation/Gait   Stairs Yes    Stairs Assistance 5: Supervision    Stair Management Technique One rail Right;Forwards;Alternating pattern    Number of Stairs 4    Height of Stairs 6               Balance Exercises - 07/02/20 0001      Balance Exercises: Standing   Standing Eyes Closed Wide (BOA);Head turns;Foam/compliant surface;5 reps    Rockerboard Anterior/posterior;Head turns;EC;30 seconds;Other reps (comment);Limitations    Gait with Head Turns Forward;2 reps   30'   Sidestepping 2 reps   inside // bars   Turning Right;Left   2 reps   Other Standing Exercises Marching on blue mat - with head turns 5 reps;    Other Standing Exercises Comments stepping over balance beam on blue mat - 5 reps each leg with CGA               PT Short Term Goals - 07/02/20 2042      PT SHORT TERM GOAL #1   Title Pt will be independent with HEP for improved strength, balance, transfers, and gait.  06/21/2020    Baseline 06/20/20: met with current program. needs to be updated as current progam is easy per pt report    Status Achieved      PT SHORT TERM GOAL #2   Title Pt will improve 5x sit<>stand to less than or equal to 15 sec to demonstrate improved functional strength and transfer efficiency.    Baseline 06/20/20: 11.96 sec's no UE support form standard height surface    Status Achieved      PT SHORT TERM GOAL #3   Title Pt will improve TUG score to less than or equal to 13.5 sec for decreased fall risk.    Baseline 06/20/20; 8.91 sec's no device    Status Achieved      PT SHORT TERM GOAL #4   Title Pt will improve Berg score to at least 47/56 for decreased fall risk.    Baseline 06/20/20: 53/56 scored today    Status Achieved      PT SHORT TERM GOAL #5   Title Pt will ambulate short distances, 50-100 ft with SPC with supervision, for improved transition to more independent household gait.    Baseline 06/20/20: met with no  device    Status Achieved             PT Long Term Goals - 07/01/20 1009      PT LONG TERM GOAL #1   Title Pt will be independent with progression of HEP for improved strength, balance, transfers, and gait.  TARGET 07/19/2020  Time 9    Period Weeks    Status Achieved      PT LONG TERM GOAL #2   Title Pt will improve 5x sit<>stand to less than or equal to 12.5 sec to demonstrate improved functional strength and transfer efficiency.    Baseline 06/20/20: 11.96 sec's no UE support from standard height surface    Status Achieved      PT LONG TERM GOAL #3   Title Pt will improve gait velocity to at least 2.62 ft/sec for improved gait efficiency and safety.    Baseline 06/20/20: 3.66 ft/sec no AD    Status Achieved      PT LONG TERM GOAL #4   Title Pt will be mod independent with outdoor gait, at least 1000 ft, using cane versus no device, for improved community gait.    Time 9    Period Weeks    Status Achieved      PT LONG TERM GOAL #5   Title Pt will verbalize understanding of fall prevention in home environment.    Time 9    Period Weeks    Status Achieved                 Plan - 07/02/20 2043    Clinical Impression Statement Pt has met 5/5 LTG's; pt reports he is pleased with progress and is ready for discharge from PT.  Pt reports he is not having any problems with mobility or with balance.    Personal Factors and Comorbidities Comorbidity 3+    Comorbidities See notes    Examination-Activity Limitations Locomotion Level;Transfers;Stand    Examination-Participation Restrictions Shop;Community Activity    Stability/Clinical Decision Making Evolving/Moderate complexity    Rehab Potential Good    PT Frequency 2x / week    PT Duration Other (comment)   for 8 weeks, plus 1 additional visit week of eval   PT Treatment/Interventions ADLs/Self Care Home Management;DME Instruction;Neuromuscular re-education;Balance training;Therapeutic exercise;Therapeutic  activities;Functional mobility training;Stair training;Gait training;Patient/family education    PT Next Visit Plan continue to work on strengthening, dynamic gait and balance reactions. add to HEP as needed.    PT Home Exercise Plan Access Code: [email or web address]    Consulted and Agree with Plan of Care Patient;Family member/caregiver    Family Member Consulted wife-Jeffrey Acosta           Patient will benefit from skilled therapeutic intervention in order to improve the following deficits and impairments:  Abnormal gait,Difficulty walking,Decreased safety awareness,Decreased balance,Decreased mobility,Decreased strength  Visit Diagnosis: Unsteadiness on feet  Other abnormalities of gait and mobility     Problem List Patient Active Problem List   Diagnosis Date Noted  . Dyslipidemia 05/23/2020  . Benign essential HTN 05/06/2020  . Subarachnoid hemorrhage ([place]) 04/30/2020  . Leukopenia 07/15/2018  . Thrombocytopenia ([place]) 07/15/2018  . PCP NOTES >>>>>>> 03/23/2015  . Annual physical exam 03/20/2015  . Bronchospasm 09/06/2014  . DJD (degenerative joint disease) 09/06/2014  . Hypothyroidism 08/28/2014  . Tremor 07/29/2014  . Gait disorder 07/29/2014  . Gout 01/08/2014  . Vitamin D deficiency 11/25/2012  . Melanoma of skin, site unspecified 11/25/2012  . 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 Alcoholism in remission ([place]) 06/19/2011  . Diverticulitis of colon (without mention of hemorrhage)(562.11) 06/19/2011  . Bipolar I disorder ([place]) 06/19/2011  . Osteopenia 05/17/2010  . Alcohol abuse 05/16/2010  . HYPERTRIGLYCERIDEMIA 08/02/2009  . Macrocytic anemia 01/10/2009  . ABNORMAL ELECTROCARDIOGRAM 11/26/2008  . REFLUX, ESOPHAGEAL 11/17/2006  . COMMON MIGRAINE 09/22/2006  PHYSICAL THERAPY DISCHARGE SUMMARY  Visits from Start of Care: 9  Current functional level related to goals / functional outcomes: All LTG's met - see above for progress towards goals   Remaining  deficits: Age-related decreased high level balance skills    Education / Equipment: Pt has been instructed in HEP for balance and strengthening exs. Plan: Patient agrees to discharge.  Patient goals were met. Patient is being discharged due to meeting the stated rehab goals.  ?????        Alda Lea, PT 07/02/2020, 8:46 PM  [place] [address] [place], [name], [address] Phone: [phone]   Fax:  [phone]  Name: TYKE OUTMAN MRN: [id number] Date of Birth: [birth year]

## 2020-07-02 NOTE — Patient Instructions (Signed)
I had a long discussion with the patient and his wife regarding his recent subarachnoid hemorrhage as well as by cerebral embolic infarcts likely from his A. fib.  He remains at recurrent risk for strokes from his A. fib but is also at high risk for hemorrhage on anticoagulants given his recent intracranial bleeding.  Recommend he start taking aspirin 81 mg daily or consider participation in the ASPIRE stroke prevention trial ( aspirin versus Eliquis ) if interested.  He will be given written information to review and decide.  I also recommend aggressive risk factor modification with strict control of hypertension with blood pressure goal below 130/90, lipids with LDL cholesterol goal below 70 mg percent and diabetes with hemoglobin A1c goal below 6.5%.  He was encouraged to eat a healthy diet with lots of fruits, vegetables, cereals, whole grains and to be active and exercise regularly and not gain weight.  Return for follow-up in future in 3 months or call earlier if necessary.  Stroke Prevention Some medical conditions and behaviors are associated with a higher chance of having a stroke. You can help prevent a stroke by making nutrition, lifestyle, and other changes, including managing any medical conditions you may have. What nutrition changes can be made?  Eat healthy foods. You can do this by: ? Choosing foods high in fiber, such as fresh fruits and vegetables and whole grains. ? Eating at least 5 or more servings of fruits and vegetables a day. Try to fill half of your plate at each meal with fruits and vegetables. ? Choosing lean protein foods, such as lean cuts of meat, poultry without skin, fish, tofu, beans, and nuts. ? Eating low-fat dairy products. ? Avoiding foods that are high in salt (sodium). This can help lower blood pressure. ? Avoiding foods that have saturated fat, trans fat, and cholesterol. This can help prevent high cholesterol. ? Avoiding processed and premade foods.  Follow  your health care provider's specific guidelines for losing weight, controlling high blood pressure (hypertension), lowering high cholesterol, and managing diabetes. These may include: ? Reducing your daily calorie intake. ? Limiting your daily sodium intake to 1,500 milligrams (mg). ? Using only healthy fats for cooking, such as olive oil, canola oil, or sunflower oil. ? Counting your daily carbohydrate intake.   What lifestyle changes can be made?  Maintain a healthy weight. Talk to your health care provider about your ideal weight.  Get at least 30 minutes of moderate physical activity at least 5 days a week. Moderate activity includes brisk walking, biking, and swimming.  Do not use any products that contain nicotine or tobacco, such as cigarettes and e-cigarettes. If you need help quitting, ask your health care provider. It may also be helpful to avoid exposure to secondhand smoke.  Limit alcohol intake to no more than 1 drink a day for nonpregnant women and 2 drinks a day for men. One drink equals 12 oz of beer, 5 oz of wine, or 1 oz of hard liquor.  Stop any illegal drug use.  Avoid taking birth control pills. Talk to your health care provider about the risks of taking birth control pills if: ? You are over 62 years old. ? You smoke. ? You get migraines. ? You have ever had a blood clot. What other changes can be made?  Manage your cholesterol levels. ? Eating a healthy diet is important for preventing high cholesterol. If cholesterol cannot be managed through diet alone, you may also need to take  medicines. ? Take any prescribed medicines to control your cholesterol as told by your health care provider.  Manage your diabetes. ? Eating a healthy diet and exercising regularly are important parts of managing your blood sugar. If your blood sugar cannot be managed through diet and exercise, you may need to take medicines. ? Take any prescribed medicines to control your diabetes as  told by your health care provider.  Control your hypertension. ? To reduce your risk of stroke, try to keep your blood pressure below 130/80. ? Eating a healthy diet and exercising regularly are an important part of controlling your blood pressure. If your blood pressure cannot be managed through diet and exercise, you may need to take medicines. ? Take any prescribed medicines to control hypertension as told by your health care provider. ? Ask your health care provider if you should monitor your blood pressure at home. ? Have your blood pressure checked every year, even if your blood pressure is normal. Blood pressure increases with age and some medical conditions.  Get evaluated for sleep disorders (sleep apnea). Talk to your health care provider about getting a sleep evaluation if you snore a lot or have excessive sleepiness.  Take over-the-counter and prescription medicines only as told by your health care provider. Aspirin or blood thinners (antiplatelets or anticoagulants) may be recommended to reduce your risk of forming blood clots that can lead to stroke.  Make sure that any other medical conditions you have, such as atrial fibrillation or atherosclerosis, are managed. What are the warning signs of a stroke? The warning signs of a stroke can be easily remembered as BEFAST.  B is for balance. Signs include: ? Dizziness. ? Loss of balance or coordination. ? Sudden trouble walking.  E is for eyes. Signs include: ? A sudden change in vision. ? Trouble seeing.  F is for face. Signs include: ? Sudden weakness or numbness of the face. ? The face or eyelid drooping to one side.  A is for arms. Signs include: ? Sudden weakness or numbness of the arm, usually on one side of the body.  S is for speech. Signs include: ? Trouble speaking (aphasia). ? Trouble understanding.  T is for time. ? These symptoms may represent a serious problem that is an emergency. Do not wait to see if  the symptoms will go away. Get medical help right away. Call your local emergency services (911 in the [place] Do not drive yourself to the hospital.  Other signs of stroke may include: ? A sudden, severe headache with no known cause. ? Nausea or vomiting. ? Seizure. Where to find more information For more information, visit:  American Stroke Association: www.strokeassociation.org  National Stroke Association: www.stroke.org Summary  You can prevent a stroke by eating healthy, exercising, not smoking, limiting alcohol intake, and managing any medical conditions you may have.  Do not use any products that contain nicotine or tobacco, such as cigarettes and e-cigarettes. If you need help quitting, ask your health care provider. It may also be helpful to avoid exposure to secondhand smoke.  Remember BEFAST for warning signs of stroke. Get help right away if you or a loved one has any of these signs. This information is not intended to replace advice given to you by your health care provider. Make sure you discuss any questions you have with your health care provider. Document Revised: 03/26/2017 Document Reviewed: 05/19/2016 Elsevier Patient Education  2021 [employer].

## 2020-07-03 ENCOUNTER — Other Ambulatory Visit: Payer: Self-pay

## 2020-07-03 ENCOUNTER — Ambulatory Visit: Payer: Medicare Other | Admitting: Cardiovascular Disease

## 2020-07-03 ENCOUNTER — Ambulatory Visit: Payer: Medicare Other | Admitting: Physical Therapy

## 2020-07-03 ENCOUNTER — Encounter: Payer: Medicare Other | Admitting: Occupational Therapy

## 2020-07-03 ENCOUNTER — Encounter: Payer: Self-pay | Admitting: Cardiovascular Disease

## 2020-07-03 VITALS — BP 130/64 | HR 42 | Ht 73.0 in | Wt 209.0 lb

## 2020-07-03 DIAGNOSIS — R55 Syncope and collapse: Secondary | ICD-10-CM

## 2020-07-03 DIAGNOSIS — I471 Supraventricular tachycardia: Secondary | ICD-10-CM | POA: Diagnosis not present

## 2020-07-03 DIAGNOSIS — E782 Mixed hyperlipidemia: Secondary | ICD-10-CM

## 2020-07-03 NOTE — Patient Instructions (Signed)
Medication Instructions:  The current medical regimen is effective;  continue present plan and medications.  *If you need a refill on your cardiac medications before your next appointment, please call your pharmacy*   Follow-Up: At [facility], you and your health needs are our priority.  As part of our continuing mission to provide you with exceptional heart care, we have created designated Provider Care Teams.  These Care Teams include your primary Cardiologist (physician) and Advanced Practice Providers (APPs -  Physician Assistants and Nurse Practitioners) who all work together to provide you with the care you need, when you need it.  We recommend signing up for the patient portal called "MyChart".  Sign up information is provided on this After Visit Summary.  MyChart is used to connect with patients for Virtual Visits (Telemedicine).  Patients are able to view lab/test results, encounter notes, upcoming appointments, etc.  Non-urgent messages can be sent to your provider as well.   To learn more about what you can do with MyChart, go to https://www.mychart.com.    Your next appointment:   6 month(s)  The format for your next appointment:   In Person  Provider:   London O'Neal, MD      

## 2020-07-04 ENCOUNTER — Ambulatory Visit: Payer: Medicare Other | Admitting: Physical Therapy

## 2020-07-04 ENCOUNTER — Ambulatory Visit: Payer: Medicare Other | Admitting: Occupational Therapy

## 2020-07-04 ENCOUNTER — Ambulatory Visit: Payer: Medicare Other

## 2020-07-08 ENCOUNTER — Ambulatory Visit: Payer: Medicare Other

## 2020-07-10 ENCOUNTER — Telehealth: Payer: Self-pay

## 2020-07-10 NOTE — Telephone Encounter (Signed)
Attempted to contact patient, LVM to call back to discuss staff message received by Dr.O'Neal to discuss Loop Recorder. Left call back number.

## 2020-07-16 DIAGNOSIS — H524 Presbyopia: Secondary | ICD-10-CM | POA: Diagnosis not present

## 2020-07-16 DIAGNOSIS — H401111 Primary open-angle glaucoma, right eye, mild stage: Secondary | ICD-10-CM | POA: Diagnosis not present

## 2020-07-16 DIAGNOSIS — H5203 Hypermetropia, bilateral: Secondary | ICD-10-CM | POA: Diagnosis not present

## 2020-07-16 DIAGNOSIS — H401121 Primary open-angle glaucoma, left eye, mild stage: Secondary | ICD-10-CM | POA: Diagnosis not present

## 2020-07-22 ENCOUNTER — Telehealth: Payer: Self-pay | Admitting: Cardiovascular Disease

## 2020-07-22 NOTE — Telephone Encounter (Signed)
Patient states he is returning Julie's call. Please advise.

## 2020-07-22 NOTE — Telephone Encounter (Signed)
Spoke with the patient and advised him that Almyra Free was out of the office today, however it looks like she had called to discuss a loop recorder.  Patient states that he is also waiting to hear if Dr. Audie Box and spoken to his other doctor about the patient restarting ASA 81 mg daily. Advised the patient that I will send a message to Almyra Free and she will return his call when she is back.

## 2020-07-23 ENCOUNTER — Encounter: Payer: Self-pay | Admitting: Occupational Therapy

## 2020-07-23 NOTE — Telephone Encounter (Signed)
Okay for patient to restart ASA 81?   I will notify patient when I call him back.   Thanks!

## 2020-07-23 NOTE — Therapy (Signed)
[facility] [address] [place], [name], [address] Phone: [phone]   Fax:  [phone]  Patient Details  Name: Jeffrey Acosta MRN: [id number] Date of Birth: 1951/01/13 Referring Provider:  No ref. provider found  Encounter Date: 07/23/2020    OCCUPATIONAL THERAPY DISCHARGE SUMMARY  Visits from Start of Care: 9  Current functional level related to goals / functional outcomes: Pt made progress towards goals, however all goals were not formally assessed as pt cancelled OT visits prior to d/c, requesting early d/c.   Remaining deficits: cognitive deficits   Education / Equipment: Pt / wife were instructed in cognitve compensations and safety for ADLs/IADLS. They verbalized understanding. Plan: Patient agrees to discharge.  Patient goals were partially met. Patient is being discharged due to the patient's request.  ?????       OT Short Term Goals - 06/24/20 1024      OT SHORT TERM GOAL #1   Title Pt will be independent with HEP for bilateral shoulder strength.--check STGs 06/20/20    Time 4    Period Weeks    Status Achieved      OT SHORT TERM GOAL #2   Title Pt will be able to perform shower transfer with supervision.    Time 4    Period Weeks    Status Achieved   06/10/20:  met at this level     OT [place] #3   Title Pt will be able to perform simple alternating attention with at least 90% accuracy for incr safety.    Time 4    Period Weeks    Status Achieved     OT SHORT TERM GOAL #4   Title Pt will perform mod complex functional problem solving with at least 90% accuracy.    Time 4    Period Weeks    Status Partially met, inconsistent     OT SHORT TERM GOAL #5   Title Pt will verbalize understanding of cognitive compensation strategies for ADLs/IADLs.    Time 4    Period Weeks    Status Achieved -Education provided with pt/ wife, they verbalize understanding           OT Long Term  Goals - 05/20/20 0925      OT LONG TERM GOAL #1   Title Pt will able to reach at least 10" bilaterally on standing functional reach test without cueing/impulsivity for improved safety for IADLs.    Time 8    Period Weeks    Status Not tested     OT LONG TERM GOAL #2   Title Pt will demo good safety awareness for retrieving items from low shelf/floor mod I/without cueing.    Time 8    Period Weeks    Status Not met- not tested     OT LONG TERM GOAL #3   Title Pt will perform simple prior cooking/home maintenance tasks mod I and demonstrating good safety awareness/problem solving.    Time 8    Period Weeks    Status Partially met, supervision for cooking     OT LONG TERM GOAL #4   Title Pt will perform simple divided attention with at least 1 physical and 1 cognitive tasks for improved safety.    Time 8    Period Weeks    Status Not formally assessed     OT LONG TERM GOAL #5   Title Pt will perform shower transfer mod I.  Time 8    Period Weeks    Status Not formally assessed         Shelia Magallon 07/23/2020, 12:14 PM Theone Murdoch, OTR/L [phone] Phone: [phone] 8:17 AM 07/24/20 [place] [address] [place], [name], [address] Phone: [phone]   Fax:  [phone]

## 2020-07-24 ENCOUNTER — Other Ambulatory Visit: Payer: Self-pay | Admitting: Internal Medicine

## 2020-07-24 NOTE — Telephone Encounter (Signed)
No to aspirin.  Neurology will make the decision on this.  Would like for him to see Dr. Loletha Grayer for a loop recorder.  Likely can just be set up to have implantation in office.  [place] T. Audie Box, MD, [place]  [address], [place], [place] [address] [phone]  9:50 AM

## 2020-07-25 NOTE — Telephone Encounter (Signed)
Called patient- LVM advising to call back to discuss- will also send mychart message.

## 2020-07-26 NOTE — Telephone Encounter (Signed)
Sent Jeffrey Acosta. Virtual visit scheduled for next week 04/05

## 2020-07-30 ENCOUNTER — Telehealth (INDEPENDENT_AMBULATORY_CARE_PROVIDER_SITE_OTHER): Payer: Medicare Other | Admitting: Cardiovascular Disease

## 2020-07-30 ENCOUNTER — Encounter: Payer: Self-pay | Admitting: Cardiovascular Disease

## 2020-07-30 DIAGNOSIS — I471 Supraventricular tachycardia: Secondary | ICD-10-CM

## 2020-07-30 DIAGNOSIS — Z8673 Personal history of transient ischemic attack (TIA), and cerebral infarction without residual deficits: Secondary | ICD-10-CM | POA: Diagnosis not present

## 2020-07-30 NOTE — Progress Notes (Signed)
Virtual Visit via Video Note   This visit type was conducted due to national recommendations for restrictions regarding the COVID-19 Pandemic (e.g. social distancing) in an effort to limit this patient's exposure and mitigate transmission in our community.  Due to his co-morbid illnesses, this patient is at least at moderate risk for complications without adequate follow up.  This format is felt to be most appropriate for this patient at this time.  All issues noted in this document were discussed and addressed.  A limited physical exam was performed with this format.  Please refer to the patient's chart for his consent to telehealth for [facility]  Date:  07/30/2020   ID:  Jeffrey Acosta, DOB [birth year], MRN [id number] The patient was identified using 2 identifiers.  Patient Location: Home Provider Location: Home Office   PCP:  Colon Branch, [place]  Cardiologist:  Evalina Field, MD   :[id number]}   Evaluation Performed:  Follow-Up Visit  Chief Complaint:  Follow-up.   History of Present Illness:    Jeffrey Acosta is a 70 y.o. male with history of atrial tachycardia, alcohol abuse, recent stroke who presents for follow-up.  We connected via video visit to discuss loop recorder.  I discussed with his primary neurologist if he felt this was necessary.  Jeffrey Acosta was admitted with a fall in the setting of alcohol abuse and found to have a subarachnoid hemorrhage on April 30, 2020.  During that hospitalization he had a subsequent stroke with altered mental status and syncope on 05/11/2020.  MRI at that time revealed multiple small infarcts in multiple distributions concerning for possible cardioembolic event.  Of note, he had no evidence of atrial fibrillation during his hospitalization.  His neurologist has recommended a loop recorder.  We discussed the procedure today.  We discussed risk and benefits.  We also discussed the need for long-term ambulatory  ECG monitoring.  He does understand this.  He is willing to proceed.  All questions were answered with he and his wife.  He denies any chest pain or shortness of breath.  He continues to do well.  He does have atrial tachycardia but this appears to be well controlled on metoprolol.   Problem List 1. Atrial tachycardia  2. HLD -T chol 119, HDL 50, LDL 54, TG 75 3. Etoh abuse 4. Subarachnoid hemorrhage 04/30/2020 -trauma/fall and etoh use  -vasospasm of L PCA on angiogram  -05/11/2020 -> acute small infarcts in basal ganglia, L parietal temporal lobe, bilateral frontal concerning for embolic event   5. Tobacco abuse   The patient does not have symptoms concerning for COVID-19 infection (fever, chills, cough, or new shortness of breath).    Past Medical History:  Diagnosis Date  . Alcoholism in remission ([place])    in [place]  . Arrhythmia    atrial tachycardia  . Bipolar affective disorder ([place])   . Diverticulosis of colon (without mention of hemorrhage)   . GERD (gastroesophageal reflux disease)   . Gout of multiple sites   . Hiatal hernia   . Hx of migraines   . Hydrocele   . HYPERLIPIDEMIA 01/10/2009   Qualifier: Diagnosis of  By: Ronnald Ramp CMA, Chemira    NMR Lipoprofile 2011 LDL could not be calculated  Due to TG of 889  (811  / 750 ),  HDL 50 . Father MI @ 34 PGM CVA early 55s; PGF CVA @72    . Hypothyroidism   .  Melanoma of skin, site unspecified 11/25/2012   07/2011 abdominal  Stage 1 Dr Amy Martinique Dr Sarajane Jews   . Osteopenia   . Polyarthralgia   . Stroke [facility])    Past Surgical History:  Procedure Laterality Date  . BILIARY BRUSHING  12/07/2018   Procedure: BILIARY BRUSHING;  Surgeon: Rush Landmark Telford Nab., MD;  Location: [facility];  Service: Gastroenterology;;  . BILIARY DILATION  12/07/2018   Procedure: BILIARY DILATION;  Surgeon: Irving Copas., MD;  Location: [facility];  Service: Gastroenterology;;  . BIOPSY  12/07/2018   Procedure: BIOPSY;  Surgeon: Irving Copas., MD;  Location: [facility];  Service: Gastroenterology;;  . COLONOSCOPY  2003   negative ; Dr Sharlett Iles  . ERCP N/A 12/07/2018   Procedure: ENDOSCOPIC RETROGRADE CHOLANGIOPANCREATOGRAPHY (ERCP);  Surgeon: Irving Copas., MD;  Location: [facility];  Service: Gastroenterology;  Laterality: N/A;  . ESOPHAGOGASTRODUODENOSCOPY (EGD) WITH PROPOFOL N/A 12/07/2018   Procedure: ESOPHAGOGASTRODUODENOSCOPY (EGD) WITH PROPOFOL;  Surgeon: Rush Landmark Telford Nab., MD;  [facility];  Service: Gastroenterology;  Laterality: N/A;  . EUS N/A 12/07/2018   Procedure: UPPER ENDOSCOPIC ULTRASOUND (EUS) RADIAL;  Surgeon: Rush Landmark Telford Nab., MD;  [facility];  Service: Gastroenterology;  Laterality: N/A;  . INGUINAL HERNIA REPAIR  08/07/2011   Procedure: HERNIA REPAIR INGUINAL ADULT;  Surgeon: Rolm Bookbinder, MD;  Location: [place];  Service: General;  Laterality: Right;  . IR ANGIO INTRA EXTRACRAN SEL INTERNAL CAROTID BILAT MOD SED  05/14/2020  . IR ANGIO VERTEBRAL SEL VERTEBRAL BILAT MOD SED  05/14/2020  . MELANOMA EXCISION  07/28/11   Dr Sarajane Jews; stomach and chest  . REMOVAL OF STONES  12/07/2018   Procedure: REMOVAL OF STONES;  Surgeon: Rush Landmark Telford Nab., MD;  Location: [facility];  Service: Gastroenterology;;  . [name]  1953  . 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 UPPER GASTROINTESTINAL ENDOSCOPY  1983   hiatal hernia  . VASECTOMY  1979     Current Meds  Medication Sig  . allopurinol (ZYLOPRIM) 300 MG tablet Take 1 tablet (300 mg total) by mouth daily.  Marland Kitchen b complex vitamins capsule Take 1 capsule by mouth daily.  . Cholecalciferol (VITAMIN D) 50 MCG (2000 UT) tablet Take 2,000 Units by mouth daily.  Marland Kitchen esomeprazole (NEXIUM) 40 MG capsule Take 1 capsule (40 mg total) by mouth daily before breakfast.  . folic acid (FOLVITE) 1 MG tablet Take 1 tablet (1 mg total) by mouth daily.  Marland Kitchen lamoTRIgine (LAMICTAL) 25 MG  tablet Take 25 mg by mouth daily.  Marland Kitchen levothyroxine (SYNTHROID) 50 MCG tablet Take 1 tablet (50 mcg total) by mouth daily before breakfast.  . metoprolol succinate (TOPROL-XL) 100 MG 24 hr tablet Take 1 tablet (100 mg total) by mouth daily. Take with or immediately following a meal.  Start taking from 2/8  . Multiple Vitamin (MULTIVITAMIN WITH MINERALS) TABS tablet Take 1 tablet by mouth daily.  . naltrexone (DEPADE) 50 MG tablet Take 50 mg by mouth daily.   . QUEtiapine (SEROQUEL) 400 MG tablet Take 800 mg by mouth at bedtime.   . sodium bicarbonate 650 MG tablet TAKE 1 TABLET (650 MG TOTAL) BY MOUTH TWO TIMES DAILY FOR 7 DAYS.  Marland Kitchen tamsulosin (FLOMAX) 0.4 MG CAPS capsule Take 1 capsule (0.4 mg total) by mouth daily after supper.  . thiamine 100 MG tablet TAKE 1 TABLET (100 MG TOTAL) BY MOUTH DAILY.  Marland Kitchen Thiamine HCl (VITAMIN B-1) 250 MG tablet Take 1  tablet (250 mg total) by mouth daily.  . timolol (TIMOPTIC) 0.5 % ophthalmic solution 1 drop 2 (two) times daily.  . vitamin B-12 (CYANOCOBALAMIN) 500 MCG tablet Take 500 mcg by mouth daily.     Allergies:   Omnipaque [iohexol]   Social History   Tobacco Use  . Smoking status: Never Smoker  . Smokeless tobacco: Never Used  Vaping Use  . Vaping Use: Never used  Substance Use Topics  . Alcohol use: Not Currently    Alcohol/week: 0.0 standard drinks    Comment: pt reports periodic alcohol  . Drug use: No    Family Hx: The patient's family history includes COPD in his father and mother; Diabetes in his paternal grandmother; Heart attack (age of onset: 24) in his father; Lung cancer in his paternal grandfather; Parkinsonism in his paternal grandmother; Stroke (age of onset: 51) in his paternal grandmother; Stroke (age of onset: 44) in his maternal grandfather. There is no history of Colon cancer or Prostate cancer.  ROS:   Please see the history of present illness.     All other systems reviewed and are negative.   Prior CV studies:    The following studies were reviewed today:  TTE 05/11/2020 1. Left ventricular ejection fraction, by estimation, is 55 to 60%. The  left ventricle has normal function. The left ventricle has no regional  wall motion abnormalities. There is moderate left ventricular hypertrophy.  Left ventricular diastolic  parameters are consistent with Grade I diastolic dysfunction (impaired  relaxation).  2. Right ventricular systolic function is mildly reduced. The right  ventricular size is mildly enlarged.  3. Left atrial size was mildly dilated.  4. The mitral valve is normal in structure. Trivial mitral valve  regurgitation. No evidence of mitral stenosis.  5. The aortic valve is tricuspid. Aortic valve regurgitation is mild.  Mild aortic valve stenosis.  6. Aortic dilatation noted. There is mild dilatation of the aortic root,  measuring 41 mm.  7. The inferior vena cava is normal in size with greater than 50%  respiratory variability, suggesting right atrial pressure of 3 mmHg.   Labs/Other Tests and Data Reviewed:    EKG:  No ECG reviewed.  Recent Labs: 05/01/2020: TSH 1.104 05/16/2020: Magnesium 1.7 05/22/2020: ALT 15; BUN 19; Creatinine, Ser 1.19; Hemoglobin 12.7; Platelets 327.0; Potassium 4.6; Sodium 140   Recent Lipid Panel Lab Results  Component Value Date/Time   CHOL 108 05/13/2020 01:00 AM   TRIG 131 05/13/2020 01:00 AM   HDL 59 05/13/2020 01:00 AM   CHOLHDL 1.8 05/13/2020 01:00 AM   LDLCALC 23 05/13/2020 01:00 AM   LDLDIRECT 41.0 03/14/2020 08:50 AM    Wt Readings from Last 3 Encounters:  07/03/20 209 lb (94.8 kg)  07/02/20 208 lb 6.4 oz (94.5 kg)  05/22/20 190 lb 2 oz (86.2 kg)     Risk Assessment/Calculations:      Objective:    Vital Signs:  There were no vitals taken for this visit.   VITAL SIGNS:  reviewed General: No acute distress by video visit Pulmonary: No shortness of breath noted Psych: Normal mood and affect  ASSESSMENT & PLAN:    1.  Atrial tachycardia (HCC) -Known history of atrial tachycardia that is well controlled on metoprolol succinate.  We will continue this.  No documented atrial fibrillation.  2. History of stroke -Recent admission to the hospital in January for traumatic subarachnoid hemorrhage.  Had a subsequent stroke concerning for small infarcts in multiple territories suggesting  possible cardioembolic stroke.  I discussed with his neurologist the need for extended ambulatory monitoring.  He has recommended a loop recorder.  I did discuss this with Jeffrey Acosta extensively today.  We discussed the risk and benefits.  Risk include bleeding, damage to internal structures and infection.  The benefits include extended monitoring to look for atrial fibrillation.  This may merit long-term anticoagulation.  He may not be a great candidate for long-term anticoagulation however his neurologist feels he does need extended monitoring for this.  We will set him up for a loop recorder with Dr. Carson Myrtle.   COVID-19 Education: The signs and symptoms of COVID-19 were discussed with the patient and how to seek care for testing (follow up with PCP or arrange E-visit).  The importance of social distancing was discussed today.  Time:   Today, I have spent 25 minutes with the patient with telehealth technology discussing the above problems.     Medication Adjustments/Labs and Tests Ordered: Current medicines are reviewed at length with the patient today.  Concerns regarding medicines are outlined above.   Tests Ordered: No orders of the defined types were placed in this encounter.   Medication Changes: No orders of the defined types were placed in this encounter.   Follow Up: He will keep his regularly scheduled follow-up appointment with me.  Signed, Evalina Field, MD  07/30/2020 1:40 PM    [place]

## 2020-08-05 DIAGNOSIS — H66001 Acute suppurative otitis media without spontaneous rupture of ear drum, right ear: Secondary | ICD-10-CM | POA: Diagnosis not present

## 2020-08-09 ENCOUNTER — Other Ambulatory Visit: Payer: Self-pay | Admitting: Internal Medicine

## 2020-08-12 ENCOUNTER — Other Ambulatory Visit: Payer: Self-pay | Admitting: Internal Medicine

## 2020-08-21 ENCOUNTER — Ambulatory Visit: Payer: Medicare Other | Admitting: Internal Medicine

## 2020-08-21 ENCOUNTER — Encounter: Payer: Self-pay | Admitting: Internal Medicine

## 2020-08-21 ENCOUNTER — Other Ambulatory Visit: Payer: Self-pay

## 2020-08-21 VITALS — BP 156/92 | HR 77 | Temp 98.0°F | Resp 16 | Ht 73.0 in | Wt 208.1 lb

## 2020-08-21 DIAGNOSIS — I10 Essential (primary) hypertension: Secondary | ICD-10-CM

## 2020-08-21 DIAGNOSIS — R030 Elevated blood-pressure reading, without diagnosis of hypertension: Secondary | ICD-10-CM | POA: Diagnosis not present

## 2020-08-21 DIAGNOSIS — H6691 Otitis media, unspecified, right ear: Secondary | ICD-10-CM

## 2020-08-21 DIAGNOSIS — E039 Hypothyroidism, unspecified: Secondary | ICD-10-CM | POA: Diagnosis not present

## 2020-08-21 DIAGNOSIS — D539 Nutritional anemia, unspecified: Secondary | ICD-10-CM

## 2020-08-21 DIAGNOSIS — H9191 Unspecified hearing loss, right ear: Secondary | ICD-10-CM

## 2020-08-21 DIAGNOSIS — I6930 Unspecified sequelae of cerebral infarction: Secondary | ICD-10-CM

## 2020-08-21 LAB — CBC WITH DIFFERENTIAL/PLATELET
Basophils Absolute: 0 10*3/uL (ref 0.0–0.1)
Basophils Relative: 0.4 % (ref 0.0–3.0)
Eosinophils Absolute: 0.1 10*3/uL (ref 0.0–0.7)
Eosinophils Relative: 1.3 % (ref 0.0–5.0)
HCT: 38 % — ABNORMAL LOW (ref 39.0–52.0)
Hemoglobin: 13.4 g/dL (ref 13.0–17.0)
Lymphocytes Relative: 11.4 % — ABNORMAL LOW (ref 12.0–46.0)
Lymphs Abs: 1.2 10*3/uL (ref 0.7–4.0)
MCHC: 35.3 g/dL (ref 30.0–36.0)
MCV: 92.7 fl (ref 78.0–100.0)
Monocytes Absolute: 0.5 10*3/uL (ref 0.1–1.0)
Monocytes Relative: 5 % (ref 3.0–12.0)
Neutro Abs: 8.7 10*3/uL — ABNORMAL HIGH (ref 1.4–7.7)
Neutrophils Relative %: 81.9 % — ABNORMAL HIGH (ref 43.0–77.0)
Platelets: 213 10*3/uL (ref 150.0–400.0)
RBC: 4.1 Mil/uL — ABNORMAL LOW (ref 4.22–5.81)
RDW: 13.4 % (ref 11.5–15.5)
WBC: 10.7 10*3/uL — ABNORMAL HIGH (ref 4.0–10.5)

## 2020-08-21 LAB — BASIC METABOLIC PANEL
BUN: 15 mg/dL (ref 6–23)
CO2: 27 mEq/L (ref 19–32)
Calcium: 9.4 mg/dL (ref 8.4–10.5)
Chloride: 105 mEq/L (ref 96–112)
Creatinine, Ser: 1.2 mg/dL (ref 0.40–1.50)
GFR: 60.67 mL/min (ref >60.00)
Glucose, Bld: 103 mg/dL — ABNORMAL HIGH (ref 70–99)
Potassium: 4.3 mEq/L (ref 3.5–5.1)
Sodium: 140 mEq/L (ref 135–145)

## 2020-08-21 LAB — TSH: TSH: 1.79 u[IU]/mL (ref 0.35–4.50)

## 2020-08-21 MED ORDER — METOPROLOL SUCCINATE ER 100 MG PO TB24: 150.0000 mg | ORAL_TABLET | Freq: Every day | ORAL | 1 refills | Status: DC

## 2020-08-21 NOTE — Patient Instructions (Addendum)
Increase metoprolol 100 mg: Take 1.5 tablets every day  Check the  blood pressure 2 or 3 times a week BP GOAL is between 110/65 and  130/90. If it is consistently higher or lower, let me know     GO TO THE LAB : Get the blood work     [place], [place] back for   a checkup in 4 months

## 2020-08-21 NOTE — Progress Notes (Addendum)
Subjective:    Patient ID: Jeffrey Acosta, male    DOB: Mar 02, 1951, 70 y.o.   MRN: [id number]  DOS:  08/21/2020 Type of visit - description: Follow-up  To discuss several issues.   Since the last office visit is doing well. He feels he is recuperating from the stroke he had in January Meds reviewed, not taking aspirin Develop a cold ~ 07/19/2020, had some bleeding on and off from R ear, went to another office April 23, prescribed amoxicillin, felt somewhat better. However at this point he has no pain but he is unable to hear from that site.   BP Readings from Last 3 Encounters:  08/21/20 (!) 156/92  07/03/20 130/64  07/02/20 (!) 160/88     Review of Systems Denies chest pain, difficulty breathing.  No palpitation  Past Medical History:  Diagnosis Date  . Alcoholism in remission ([place])    in [place]  . Arrhythmia    atrial tachycardia  . Bipolar affective disorder ([place])   . Diverticulosis of colon (without mention of hemorrhage)   . GERD (gastroesophageal reflux disease)   . Gout of multiple sites   . Hiatal hernia   . Hx of migraines   . Hydrocele   . HYPERLIPIDEMIA 01/10/2009   Qualifier: Diagnosis of  By: Ronnald Ramp CMA, Chemira    NMR Lipoprofile 2011 LDL could not be calculated  Due to TG of 889  (811  / 750 ),  HDL 50 . Father MI @ 11 PGM CVA early 39s; PGF CVA @72    . Hypothyroidism   . Melanoma of skin, site unspecified 11/25/2012   07/2011 abdominal  Stage 1 Dr Amy Martin[place] Dr Sarajane Jews   . Osteopenia   . Polyarthralgia   . Stroke [facility])     Past Surgical History:  Procedure Laterality Date  . BILIARY BRUSHING  12/07/2018   Procedure: BILIARY BRUSHING;  Surgeon: Rush Landmark Telford Nab., MD;  Location: [facility];  Service: Gastroenterology;;  . BILIARY DILATION  12/07/2018   Procedure: BILIARY DILATION;  Surgeon: Irving Copas., MD;  Location: [facility];  Service: Gastroenterology;;  . BIOPSY  12/07/2018   Procedure: BIOPSY;  Surgeon: Irving Copas.,  MD;  [facility];  Service: Gastroenterology;;  . COLONOSCOPY  2003   negative ; Dr Sharlett Iles  . ERCP N/A 12/07/2018   Procedure: ENDOSCOPIC RETROGRADE CHOLANGIOPANCREATOGRAPHY (ERCP);  Surgeon: Irving Copas., MD;  Location: [facility];  Service: Gastroenterology;  Laterality: N/A;  . ESOPHAGOGASTRODUODENOSCOPY (EGD) WITH PROPOFOL N/A 12/07/2018   Procedure: ESOPHAGOGASTRODUODENOSCOPY (EGD) WITH PROPOFOL;  Surgeon: Rush Landmark Telford Nab., MD;  [facility];  Service: Gastroenterology;  Laterality: N/A;  . EUS N/A 12/07/2018   Procedure: UPPER ENDOSCOPIC ULTRASOUND (EUS) RADIAL;  Surgeon: Rush Landmark Telford Nab., MD;  [facility];  Service: Gastroenterology;  Laterality: N/A;  . INGUINAL HERNIA REPAIR  08/07/2011   Procedure: HERNIA REPAIR INGUINAL ADULT;  Surgeon: Rolm Bookbinder, MD;  Location: [place];  Service: General;  Laterality: Right;  . IR ANGIO INTRA EXTRACRAN SEL INTERNAL CAROTID BILAT MOD SED  05/14/2020  . IR ANGIO VERTEBRAL SEL VERTEBRAL BILAT MOD SED  05/14/2020  . MELANOMA EXCISION  07/28/11   Dr Sarajane Jews; stomach and chest  . REMOVAL OF STONES  12/07/2018   Procedure: REMOVAL OF STONES;  Surgeon: Rush Landmark Telford Nab., MD;  Location: [facility];  Service: Gastroenterology;;  . [name]  1951  . 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 UPPER GASTROINTESTINAL ENDOSCOPY  1983   hiatal hernia  . VASECTOMY  1979    Allergies as of 08/21/2020      Reactions   Omnipaque [iohexol] Hives    Desc: developed hives after injection; resolved with 50 mg benadryl given to pt.      Medication List       Accurate as of August 21, 2020  9:48 AM. If you have any questions, ask your nurse or doctor.        STOP taking these medications   niMODipine 30 MG capsule Commonly known as: NIMOTOP Stopped by: Kathlene November, MD     TAKE these medications   allopurinol 300 MG tablet Commonly known as: ZYLOPRIM Take 1  tablet (300 mg total) by mouth daily.   b complex vitamins capsule Take 1 capsule by mouth daily.   esomeprazole 40 MG capsule Commonly known as: NEXIUM Take 1 capsule (40 mg total) by mouth daily before breakfast.   folic acid 1 MG tablet Commonly known as: FOLVITE Take 1 tablet (1 mg total) by mouth daily.   lamoTRIgine 25 MG tablet Commonly known as: LAMICTAL Take 25 mg by mouth daily.   levothyroxine 50 MCG tablet Commonly known as: SYNTHROID Take 1 tablet (50 mcg total) by mouth daily before breakfast.   metoprolol succinate 100 MG 24 hr tablet Commonly known as: TOPROL-XL Take 1 tablet (100 mg total) by mouth daily. Take with or immediately following a meal.  Start taking from 2/8   multivitamin with minerals Tabs tablet Take 1 tablet by mouth daily.   naltrexone 50 MG tablet Commonly known as: DEPADE Take 50 mg by mouth daily.   QUEtiapine 400 MG tablet Commonly known as: SEROQUEL Take 800 mg by mouth at bedtime.   sodium bicarbonate 650 MG tablet TAKE 1 TABLET (650 MG TOTAL) BY MOUTH TWO TIMES DAILY FOR 7 DAYS.   tamsulosin 0.4 MG Caps capsule Commonly known as: FLOMAX Take 1 capsule (0.4 mg total) by mouth daily after supper.   timolol 0.5 % ophthalmic solution Commonly known as: TIMOPTIC 1 drop 2 (two) times daily.   thiamine 100 MG tablet TAKE 1 TABLET (100 MG TOTAL) BY MOUTH DAILY.   vitamin B-1 250 MG tablet Take 1 tablet (250 mg total) by mouth daily.   vitamin B-12 500 MCG tablet Commonly known as: CYANOCOBALAMIN Take 500 mcg by mouth daily.   Vitamin D 50 MCG (2000 UT) tablet Take 2,000 Units by mouth daily.          Objective:   Physical Exam BP (!) 156/92 (BP Location: Left Arm, Patient Position: Sitting, Cuff Size: Small)   Pulse 77   Temp 98 F (36.7 C) (Oral)   Resp 16   Ht 6\' 1"  (1.854 m)   Wt 208 lb 2 oz (94.4 kg)   SpO2 96%   BMI 27.46 kg/m  General:   Well developed, NAD, BMI noted. HEENT:  Normocephalic . Face  symmetric, atraumatic. L ear: Normal R ear: Canal normal, TM seems intact flat, pink in color, no discharge or bleeding noted. Lungs:  CTA B Normal respiratory effort, no intercostal retractions, no accessory muscle use. Heart: RRR,  no murmur.  Lower extremities: no pretibial edema bilaterally  Skin: Not pale. Not jaundice Neurologic:  alert & oriented X3.  Speech normal, gait appropriate for age and unassisted Psych--  Cognition and judgment appear intact.  Cooperative with normal attention span and concentration.  Behavior appropriate. No anxious or depressed appearing.  Assessment      Assessment  Bipolar disorder-- Dr Toy Care, dc lithium 07-2014 EtOH abuse-- remission w/ occ relapse Hypothyroidism Hyperlipidemia E.D.  Atrial Tachycardia dx 03/2019, + Zio patch,  MSK: ---Back pain: Saw Dr. Ramos/Geoffrey~ 2002, 3 local injections, offered surgery.  ---Gout: Dr Berna Bue, released to PCP 2018 Melanoma 2014 Dr. Martinique, sees derm regularly as off 02-2020 Osteopenia  T score -2.4  ( [phone];  T score is -2.0  (08/2018). 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 Last visit with neurology 07/02/2020, was recommended aspirin 81 mg daily.  BP goal less than 130/90, LDL  70. At this point the patient is not taking aspirin, he will have a procedure on Monday (loop implant), will send a message to neuro with above information. Addendum: Neurology do recommend aspirin.  Patient aware. Elevated BP: BP today slightly elevated at home is in the 140s/90s.  Recommend increase metoprolol to 150 mg daily (heart rate today 77) and continue monitoring BPs.  Check a BMP. Atrial tachycardia Seen by  cardiology 07/30/2020, atrial tachycardia felt to be well controlled on beta-blockers, due to the history of a stroke was recommended loop recorder. Hypothyroidism: Check TSH Mild anemia, per chart review: CBC Otitis, R: Was appropriately treated with antibiotics, now unable to hear, exam with no obvious pathology, referred to ENT. Vitamin D deficiency: On daily supplements Preventive care: Had [place], plans to get a second booster. RTC 4 months   This visit occurred during the SARS-CoV-2 public health emergency.  Safety protocols were in place, including screening questions prior to the visit, additional usage of staff PPE, and extensive cleaning of exam room while observing appropriate contact time as indicated for disinfecting solutions.

## 2020-08-22 NOTE — Assessment & Plan Note (Addendum)
H/o SAH, acute stroke, January 2022. Last visit with neurology 07/02/2020, was recommended aspirin 81 mg daily.  BP goal less than 130/90, LDL  70. At this point the patient is not taking aspirin, he will have a procedure on Monday (loop implant), will send a message to neuro with above information. Addendum: Neurology do recommend aspirin.  Patient aware. Elevated BP: BP today slightly elevated at home is in the 140s/90s.  Recommend increase metoprolol to 150 mg daily (heart rate today 77) and continue monitoring BPs.  Check a BMP. Atrial tachycardia Seen by cardiology 07/30/2020, atrial tachycardia felt to be well controlled on beta-blockers, due to the history of a stroke was recommended loop recorder. Hypothyroidism: Check TSH Mild anemia, per chart review: CBC Otitis, R: Was appropriately treated with antibiotics, now unable to hear, exam with no obvious pathology, referred to ENT. Vitamin D deficiency: On daily supplements Preventive care: Had [place], plans to get a second booster. RTC 4 months

## 2020-08-23 ENCOUNTER — Telehealth: Payer: Self-pay

## 2020-08-23 NOTE — Telephone Encounter (Signed)
-----   Message from Colon Branch, MD sent at 08/22/2020  4:33 PM EDT ----- Regarding: FW: ASA Call patient, advise neurology do recommend aspirin, 81 mg daily.  I already added to his med list    ----- Message ----- From: Garvin Fila, MD Sent: 08/22/2020   4:20 PM EDT To: Colon Branch, MD Subject: RE: ASA                                        Yes I recommend aspirin 81 mg daily ----- Message ----- From: Colon Branch, MD Sent: 08/21/2020  10:17 AM EDT To: Garvin Fila, MD Subject: ASA                                            Dr. Leonie Man, Saw our mutual patient today, he is not taking aspirin, I am not completely clear if you have strongly recommended it.  He is going to have a loop implant next week so I told him not to restart aspirin until he hears from you. Thank you JP

## 2020-08-23 NOTE — Telephone Encounter (Signed)
Spoke w/ Pt- informed of recommendation. Pt verbalized understanding.  

## 2020-08-26 ENCOUNTER — Other Ambulatory Visit: Payer: Self-pay

## 2020-08-26 ENCOUNTER — Ambulatory Visit: Payer: Medicare Other | Admitting: Cardiovascular Disease

## 2020-08-26 DIAGNOSIS — I639 Cerebral infarction, unspecified: Secondary | ICD-10-CM

## 2020-08-26 DIAGNOSIS — Z95818 Presence of other cardiac implants and grafts: Secondary | ICD-10-CM

## 2020-08-26 HISTORY — DX: Presence of other cardiac implants and grafts: Z95.818

## 2020-08-26 MED ORDER — LIDOCAINE-EPINEPHRINE 1 %-1:100000 IJ SOLN
10.0000 mL | Freq: Once | INTRAMUSCULAR | Status: DC
Start: 2020-08-26 — End: 2020-12-24

## 2020-08-26 NOTE — Patient Instructions (Signed)
Discharge Instructions for  Loop Recorder Implant    Follow up: Keep your wound check appointment on 09/06/20 at 10:40 am. This will be a virtual appointment.  If you have any questions or concerns, please call the office at [phone].  ACTIVITY No restrictions. DO wear your seatbelt, even if it crosses over the site.   WOUND CARE  Keep the wound area clean and dry.  Remove the dressing the day after (usually 24 hours after the procedure).  DO NOT SUBMERGE UNDER WATER UNTIL FULLY HEALED (no tub baths, hot tubs, swimming pools, etc.).   You  may shower or take a sponge bath after the dressing is removed. DO NOT SOAK the area and do not allow the shower to directly spray on the site.  If you have tape/steri-strips on your wound, these will fall off; do not pull them off prematurely.    No bandage is needed on the site.  DO  NOT apply any creams, oils, or ointments to the wound area.  If you notice any drainage or discharge from the wound, any swelling, excessive redness or bruising at the site, or if you develop a fever > 101? F, call the office at once at [phone].

## 2020-08-26 NOTE — Progress Notes (Signed)
LOOP RECORDER IMPLANT   Procedure report  Procedure performed:  Loop recorder implantation   Reason for procedure:  Cryptogenic stroke Procedure performed by:  Sanda Klein, MD  Complications:  None  Estimated blood loss:  <5 mL  Medications administered during procedure:  Lidocaine 1% with 1/100,000 epinephrine 10 mL locally Device details:  Medtronic Reveal Linq model number [id number], serial number [id number] Procedure details:  After the risks and benefits of the procedure were discussed the patient provided informed consent. The patient was prepped and draped in usual sterile fashion. Local anesthesia was administered to an area 2 cm to the left of the sternum in the 4th intercostal space. A cutaneous incision was made using the incision tool. The introducer was then used to create a subcutaneous tunnel and carefully deploy the device. Local pressure was held to ensure hemostasis.  The incision was closed with SteriStrips and a sterile dressing was applied.  R waves 0.55 mV.  Sanda Klein, MD, [facility] [phone] office [phone] pager 08/26/2020 12:53 PM

## 2020-09-06 ENCOUNTER — Telehealth (INDEPENDENT_AMBULATORY_CARE_PROVIDER_SITE_OTHER): Payer: Medicare Other | Admitting: Cardiovascular Disease

## 2020-09-06 DIAGNOSIS — Z95818 Presence of other cardiac implants and grafts: Secondary | ICD-10-CM

## 2020-09-06 NOTE — Progress Notes (Signed)
Loop recorder implant is well healed. Wound check via video visit. Site is well healed: no redness, swelling or drainage.  No problems. Downloads via cell phone. F/U yearly or as needed.

## 2020-09-10 ENCOUNTER — Telehealth: Payer: Self-pay | Admitting: *Deleted

## 2020-09-10 NOTE — Telephone Encounter (Signed)
Dr Henrene Pastor,  Pt is scheduled for an EGD with you 6-14 Tuesday- pt had a CVA 05-11-2020 with mental status changes and syncope while an inpt for a fall with a subarachnoid hemorrhage, he recently had a loop recorder placed for atrial tachy.  See note from cardio below- 05-11-20 Echo EF 55-60 % with mild stenosis  Last egd 02-06-2019, with + Barrett's , GERD and a 1 yr recall   Jeffrey Acosta is a 70 y.o. male with history of atrial tachycardia, alcohol abuse, recent stroke who presents for follow-up.  We connected via video visit to discuss loop recorder.  I discussed with his primary neurologist if he felt this was necessary.  [name] was admitted with a fall in the setting of alcohol abuse and found to have a subarachnoid hemorrhage on April 30, 2020.  During that hospitalization he had a subsequent stroke with altered mental status and syncope on 05/11/2020.  MRI at that time revealed multiple small infarcts in multiple distributions concerning for possible cardioembolic event.  Of note, he had no evidence of atrial fibrillation during his hospitalization.  His neurologist has recommended a loop recorder.  We discussed the procedure today.  We discussed risk and benefits.  We also discussed the need for long-term ambulatory ECG monitoring.  He does understand this.  He is willing to proceed.  All questions were answered with he and his wife.  He denies any chest pain or shortness of breath.  He continues to do well.  He does have atrial tachycardia but this appears to be well controlled on metoprolol.  Does pt need an OV since the above significant changes this year  or is he ok to proceed as scheduled ?  Please advise and thank you so much for your time,Marie PV

## 2020-09-11 ENCOUNTER — Ambulatory Visit: Payer: Medicare Other | Admitting: Internal Medicine

## 2020-09-23 NOTE — Telephone Encounter (Signed)
Medical history reviewed.  Please have him seen by a previsit nurse.  If he looks okay, which I suspect he will, okay to proceed with EGD in [place].  If not, someone may reach out to me.  Thanks

## 2020-09-24 ENCOUNTER — Ambulatory Visit (AMBULATORY_SURGERY_CENTER): Payer: Self-pay | Admitting: *Deleted

## 2020-09-24 ENCOUNTER — Other Ambulatory Visit: Payer: Self-pay

## 2020-09-24 VITALS — Ht 73.0 in | Wt 209.0 lb

## 2020-09-24 DIAGNOSIS — K22719 Barrett's esophagus with dysplasia, unspecified: Secondary | ICD-10-CM

## 2020-09-24 NOTE — Progress Notes (Signed)

## 2020-09-25 ENCOUNTER — Ambulatory Visit (INDEPENDENT_AMBULATORY_CARE_PROVIDER_SITE_OTHER): Payer: Medicare Other | Admitting: Otolaryngology

## 2020-09-25 ENCOUNTER — Encounter (INDEPENDENT_AMBULATORY_CARE_PROVIDER_SITE_OTHER): Payer: Self-pay | Admitting: Otolaryngology

## 2020-09-25 VITALS — Temp 96.4°F

## 2020-09-25 DIAGNOSIS — H6981 Other specified disorders of Eustachian tube, right ear: Secondary | ICD-10-CM

## 2020-09-25 DIAGNOSIS — H903 Sensorineural hearing loss, bilateral: Secondary | ICD-10-CM | POA: Diagnosis not present

## 2020-09-25 DIAGNOSIS — H90A21 Sensorineural hearing loss, unilateral, right ear, with restricted hearing on the contralateral side: Secondary | ICD-10-CM | POA: Diagnosis not present

## 2020-09-25 DIAGNOSIS — H90A31 Mixed conductive and sensorineural hearing loss, unilateral, right ear with restricted hearing on the contralateral side: Secondary | ICD-10-CM | POA: Diagnosis not present

## 2020-09-25 NOTE — Progress Notes (Signed)
HPI: Jeffrey Acosta is a 70 y.o. male who presents is referred by his PCP for evaluation of hearing loss.  He has noticed gradual decline in hearing especially on the right side over the past couple years.  More recently about a month ago he had a bad cold and had worsening of his hearing in the right ear.  He was treated with Tylenol and amoxicillin and the hearing is doing a little bit better but he still feels like he is in a "barrel".  He has had no further ear pain.  He presents today for audiologic testing..  Past Medical History:  Diagnosis Date  . Alcoholism in remission ([place])    in [place]  . Arrhythmia    atrial tachycardia  . Bipolar affective disorder ([place])   . Diverticulosis of colon (without mention of hemorrhage)   . GERD (gastroesophageal reflux disease)   . Gout of multiple sites   . Hiatal hernia   . Hx of migraines   . Hydrocele   . HYPERLIPIDEMIA 01/10/2009   Qualifier: Diagnosis of  By: Ronnald Ramp CMA, [place]    NMR Lipoprofile 2011 LDL could not be calculated  Due to TG of 889  (811  / 750 ),  HDL 50 . Father MI @ 53 PGM CVA early 37s; PGF CVA @72    . Hypothyroidism   . Melanoma of skin, site unspecified 11/25/2012   07/2011 abdominal  Stage 1 Dr Amy [place] Dr Sarajane Jews   . Osteopenia   . Polyarthralgia   . Stroke ([place])   . Subarachnoid hemorrhage [facility])    Past Surgical History:  Procedure Laterality Date  . BILIARY BRUSHING  12/07/2018   Procedure: BILIARY BRUSHING;  Surgeon: Rush Landmark Telford Nab., MD;  Location: [facility];  Service: Gastroenterology;;  . BILIARY DILATION  12/07/2018   Procedure: BILIARY DILATION;  Surgeon: Irving Copas., MD;  Location: [facility];  Service: Gastroenterology;;  . BIOPSY  12/07/2018   Procedure: BIOPSY;  Surgeon: Irving Copas., MD;  Location: [facility];  Service: Gastroenterology;;  . COLONOSCOPY  2003   negative ; Dr Sharlett Iles  . ERCP N/A 12/07/2018   Procedure: ENDOSCOPIC RETROGRADE CHOLANGIOPANCREATOGRAPHY  (ERCP);  Surgeon: Irving Copas., MD;  Location: [facility];  Service: Gastroenterology;  Laterality: N/A;  . ESOPHAGOGASTRODUODENOSCOPY (EGD) WITH PROPOFOL N/A 12/07/2018   Procedure: ESOPHAGOGASTRODUODENOSCOPY (EGD) WITH PROPOFOL;  Surgeon: Rush Landmark Telford Nab., MD;  Location: [facility];  Service: Gastroenterology;  Laterality: N/A;  . EUS N/A 12/07/2018   Procedure: UPPER ENDOSCOPIC ULTRASOUND (EUS) RADIAL;  Surgeon: Rush Landmark Telford Nab., MD;  Location: [facility];  Service: Gastroenterology;  Laterality: N/A;  . INGUINAL HERNIA REPAIR  08/07/2011   Procedure: HERNIA REPAIR INGUINAL ADULT;  Surgeon: Rolm Bookbinder, MD;  Location: [facility];  Service: General;  Laterality: Right;  . IR ANGIO INTRA EXTRACRAN SEL INTERNAL CAROTID BILAT MOD SED  05/14/2020  . IR ANGIO VERTEBRAL SEL VERTEBRAL BILAT MOD SED  05/14/2020  . LOOP RECORDER INSERTION     06/2020  . MELANOMA EXCISION  07/28/11   Dr Sarajane Jews; stomach and chest  . REMOVAL OF STONES  12/07/2018   Procedure: REMOVAL OF STONES;  Surgeon: Rush Landmark Telford Nab., MD;  Location: [facility];  Service: Gastroenterology;;  . [name]  1953  . 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 UPPER GASTROINTESTINAL ENDOSCOPY  1983   hiatal hernia  . VASECTOMY  1979   Social History   Socioeconomic History  . Marital  status: Married    Spouse name: [name]  . Number of children: 3  . Years of education: Not on file  . Highest education level: Not on file  Occupational History  . Occupation: retired  Tobacco Use  . Smoking status: Never Smoker  . Smokeless tobacco: Never Used  Vaping Use  . Vaping Use: Never used  Substance and Sexual Activity  . Alcohol use: Not Currently    Alcohol/week: 0.0 standard drinks    Comment: currently sober since 04/2020  . Drug use: No  . Sexual activity: Yes    Partners: Female    Comment: Having some issues with ED.  Would like provider's recommendation.     Other Topics Concern  . Not on file  Social History Narrative   Lives with Wife, [name]   Right Handed   Drinks 4-5 cups caffeine/week   Social Determinants of Health   Financial Resource Strain: Not on file  Food Insecurity: Not on file  Transportation Needs: Not on file  Physical Activity: Not on file  Stress: Not on file  Social Connections: Not on file   Family History  Problem Relation Age of Onset  . COPD Father   . Heart attack Father 23  . COPD Mother   . Diabetes Paternal Grandmother   . Parkinsonism Paternal Grandmother   . Stroke Paternal Grandmother 30  . Stroke Maternal Grandfather 72  . Lung cancer Paternal Grandfather        Heavy smoker  . Colon cancer Neg Hx   . Prostate cancer Neg Hx   . Esophageal cancer Neg Hx   . Rectal cancer Neg Hx   . Stomach cancer Neg Hx    Allergies  Allergen Reactions  . Omnipaque [Iohexol] Hives     Desc: developed hives after injection; resolved with 50 mg benadryl given to pt.    Prior to Admission medications   Medication Sig Start Date End Date Taking? Authorizing Provider  allopurinol (ZYLOPRIM) 300 MG tablet Take 1 tablet (300 mg total) by mouth daily. 08/12/20   Colon Branch, MD  aspirin EC 81 MG tablet Take 81 mg by mouth daily. Swallow whole.    [provider]  b complex vitamins capsule Take 1 capsule by mouth daily.    [provider]  Cholecalciferol (VITAMIN D) 50 MCG (2000 UT) tablet Take 2,000 Units by mouth daily.    [provider]  esomeprazole (NEXIUM) 40 MG capsule Take 1 capsule (40 mg total) by mouth daily before breakfast. 01/11/20   Colon Branch, MD  folic acid (FOLVITE) 1 MG tablet Take 1 tablet (1 mg total) by mouth daily. 06/12/20   Colon Branch, MD  lamoTRIgine (LAMICTAL) 25 MG tablet Take 25 mg by mouth daily. 12/30/19   [provider]  levothyroxine (SYNTHROID) 50 MCG tablet Take 1 tablet (50 mcg total) by mouth daily before breakfast. 08/12/20   Colon Branch, MD   metoprolol succinate (TOPROL-XL) 100 MG 24 hr tablet Take 1.5 tablets (150 mg total) by mouth daily. Take with or immediately following a meal. 08/21/20   Colon Branch, MD  Multiple Vitamin (MULTIVITAMIN WITH MINERALS) TABS tablet Take 1 tablet by mouth daily.    [provider]  naltrexone (DEPADE) 50 MG tablet Take 50 mg by mouth daily.  01/15/16   [provider]  QUEtiapine (SEROQUEL) 400 MG tablet Take 800 mg by mouth at bedtime.     [provider]  tamsulosin (  FLOMAX) 0.4 MG CAPS capsule Take 1 capsule (0.4 mg total) by mouth daily after supper. 10/12/16   Colon Branch, MD  thiamine 100 MG tablet TAKE 1 TABLET (100 MG TOTAL) BY MOUTH DAILY. 05/16/20 05/16/21  Florencia Reasons, MD  Thiamine HCl (VITAMIN B-1) 250 MG tablet Take 1 tablet (250 mg total) by mouth daily. 06/12/20   Colon Branch, MD  timolol (TIMOPTIC) 0.5 % ophthalmic solution 1 drop 2 (two) times daily. 03/17/19   [provider]  vitamin B-12 (CYANOCOBALAMIN) 500 MCG tablet Take 500 mcg by mouth daily.    [provider]     Positive ROS: Otherwise negative  All other systems have been reviewed and were otherwise negative with the exception of those mentioned in the HPI and as above.  Physical Exam: Constitutional: Alert, well-appearing, no acute distress Ears: External ears without lesions or tenderness. Ear canals with a small amount of wax in both ear canals which is nonobstructing.  TMs are relatively clear. Nasal: External nose without lesions. Septum with minimal deformity. Clear nasal passages Oral: Lips and gums without lesions. Tongue and palate mucosa without lesions. Posterior oropharynx clear. Neck: No palpable adenopathy or masses Respiratory: Breathing comfortably  Skin: No facial/neck lesions or rash noted.  Audiologic testing demonstrated a downsloping sensorineural hearing loss in both ears.  SRT's were 45 dB on the right and 55 dB on the left.  Tympanograms were type A on  the left and type C on the right.  Procedures  Assessment: Bilateral downsloping sensorineural hearing loss. Recent call with secondary eustachian tube dysfunction on the right side.  Plan: Suggested using Flonase 2 sprays each nostril at night if he has any congestion in the ears. Also reviewed with him concerning the hearing test and that he would be a candidate for hearing aids and referred him to [facility] to see about getting hearing aids.   Radene Journey, MD   CC:

## 2020-09-29 LAB — CUP PACEART REMOTE DEVICE CHECK
Date Time Interrogation Session: 20220604142404
Implantable Pulse Generator Implant Date: 20220502

## 2020-09-30 ENCOUNTER — Ambulatory Visit (INDEPENDENT_AMBULATORY_CARE_PROVIDER_SITE_OTHER): Payer: Medicare Other

## 2020-09-30 DIAGNOSIS — I639 Cerebral infarction, unspecified: Secondary | ICD-10-CM | POA: Diagnosis not present

## 2020-10-07 ENCOUNTER — Encounter: Payer: Self-pay | Admitting: Internal Medicine

## 2020-10-08 ENCOUNTER — Other Ambulatory Visit: Payer: Self-pay

## 2020-10-08 ENCOUNTER — Encounter: Payer: Self-pay | Admitting: Internal Medicine

## 2020-10-08 ENCOUNTER — Ambulatory Visit (AMBULATORY_SURGERY_CENTER): Payer: Medicare Other | Admitting: Internal Medicine

## 2020-10-08 VITALS — BP 140/80 | HR 55 | Temp 97.1°F | Resp 18 | Ht 73.0 in | Wt 209.0 lb

## 2020-10-08 DIAGNOSIS — K22719 Barrett's esophagus with dysplasia, unspecified: Secondary | ICD-10-CM

## 2020-10-08 DIAGNOSIS — K2270 Barrett's esophagus without dysplasia: Secondary | ICD-10-CM | POA: Diagnosis not present

## 2020-10-08 MED ORDER — SODIUM CHLORIDE 0.9 % IV SOLN: 500.0000 mL | Freq: Once | INTRAVENOUS | Status: DC

## 2020-10-08 NOTE — Op Note (Signed)
[name]: Jeffrey Acosta Procedure Date: 10/08/2020 10:06 AM MRN: [id number] Endoscopist: Docia Chuck. Henrene Pastor , MD Age: 70 Referring MD:  Date of Birth: 03-26-1951 Gender: Male Account #: [id number] Procedure:                Upper GI endoscopy with biopsies Indications:              Follow-up of Barrett's esophagus. Index examination                            October 2020. Nondysplastic Barrett's, long segment. Medicines:                Monitored Anesthesia Care Procedure:                Pre-Anesthesia Assessment:                           - Prior to the procedure, a History and Physical                            was performed, and patient medications and                            allergies were reviewed. The patient's tolerance of                            previous anesthesia was also reviewed. The risks                            and benefits of the procedure and the sedation                            options and risks were discussed with the patient.                            All questions were answered, and informed consent                            was obtained. Prior Anticoagulants: The patient has                            taken no previous anticoagulant or antiplatelet                            agents. ASA Grade Assessment: II - A patient with                            mild systemic disease. After reviewing the risks                            and benefits, the patient was deemed in                            satisfactory condition to undergo the procedure.  After obtaining informed consent, the endoscope was                            passed under direct vision. Throughout the                            procedure, the patient's blood pressure, pulse, and                            oxygen saturations were monitored continuously. The                            Endoscope was introduced through the mouth, and                             advanced to the second part of duodenum. The upper                            GI endoscopy was accomplished without difficulty.                            The patient tolerated the procedure well. Scope In: Scope Out: Findings:                 The esophagus revealed Barrett's type mucosa                            involving the distal 6 cm (C4 M6). Examined under                            white light and NBI. No nodules or mucosal                            irregularities. Four-quadrant biopsies were taken                            every 1 to 2 cm. A total of 20 biopsies obtained                           The esophagus was otherwise normal.                           The stomach was normal, save sliding hiatal hernia.                           The examined duodenum was normal.                           The cardia and gastric fundus were normal on                            retroflexion. Complications:            No immediate complications. Estimated Blood Loss:     Estimated blood loss: none. Impression:  1. Long segment Barrett's esophagus status post                            biopsies                           2. GERD                           3. Otherwise unremarkable EGD. Recommendation:           - Patient has a contact number available for                            emergencies. The signs and symptoms of potential                            delayed complications were discussed with the                            patient. Return to normal activities tomorrow.                            Written discharge instructions were provided to the                            patient.                           - Resume previous diet.                           - Continue present medications.                           - Await pathology results.                           -Recommend repeat EGD for surveillance in 3 years                            if no dysplasia on  biopsies. Docia Chuck. Henrene Pastor, MD 10/08/2020 10:37:49 AM This report has been signed electronically.

## 2020-10-08 NOTE — Progress Notes (Signed)
Vitals-NS  Pt's states no medical or surgical changes since previsit or office visit. 

## 2020-10-08 NOTE — Patient Instructions (Signed)
Handout given for Barrett's Esophagus.   YOU HAD AN ENDOSCOPIC PROCEDURE TODAY AT [facility]:   Refer to the procedure report that was given to you for any specific questions about what was found during the examination.  If the procedure report does not answer your questions, please call your gastroenterologist to clarify.  If you requested that your care partner not be given the details of your procedure findings, then the procedure report has been included in a sealed envelope for you to review at your convenience later.  YOU SHOULD EXPECT: Some feelings of bloating in the abdomen. Passage of more gas than usual.  Walking can help get rid of the air that was put into your GI tract during the procedure and reduce the bloating. If you had a lower endoscopy (such as a colonoscopy or flexible sigmoidoscopy) you may notice spotting of blood in your stool or on the toilet paper. If you underwent a bowel prep for your procedure, you may not have a normal bowel movement for a few days.  Please Note:  You might notice some irritation and congestion in your nose or some drainage.  This is from the oxygen used during your procedure.  There is no need for concern and it should clear up in a day or so.  SYMPTOMS TO REPORT IMMEDIATELY:  Following upper endoscopy (EGD)  Vomiting of blood or coffee ground material  New chest pain or pain under the shoulder blades  Painful or persistently difficult swallowing  New shortness of breath  Fever of 100F or higher  Black, tarry-looking stools  For urgent or emergent issues, a gastroenterologist can be reached at any hour by calling [phone]. Do not use MyChart messaging for urgent concerns.    DIET:  We do recommend a small meal at first, but then you may proceed to your regular diet.  Drink plenty of fluids but you should avoid alcoholic beverages for 24 hours.  ACTIVITY:  You should plan to take it easy for the rest of today and you  should NOT DRIVE or use heavy machinery until tomorrow (because of the sedation medicines used during the test).    FOLLOW UP: Our staff will call the number listed on your records 48-72 hours following your procedure to check on you and address any questions or concerns that you may have regarding the information given to you following your procedure. If we do not reach you, we will leave a message.  We will attempt to reach you two times.  During this call, we will ask if you have developed any symptoms of COVID 19. If you develop any symptoms (ie: fever, flu-like symptoms, shortness of breath, cough etc.) before then, please call [phone].  If you test positive for Covid 19 in the 2 weeks post procedure, please call and report this information to [place].    If any biopsies were taken you will be contacted by phone or by letter within the next 1-3 weeks.  Please call us at [phone] if you have not heard about the biopsies in 3 weeks.    SIGNATURES/CONFIDENTIALITY: You and/or your care partner have signed paperwork which will be entered into your electronic medical record.  These signatures attest to the fact that that the information above on your After Visit Summary has been reviewed and is understood.  Full responsibility of the confidentiality of this discharge information lies with you and/or your care-partner.

## 2020-10-08 NOTE — Progress Notes (Signed)
Called to room to assist during endoscopic procedure.  Patient ID and intended procedure confirmed with present staff. Received instructions for my participation in the procedure from the performing physician.  

## 2020-10-08 NOTE — Progress Notes (Signed)
PT taken to PACU. Monitors in place. VSS. Report given to RN. 

## 2020-10-09 DIAGNOSIS — N5201 Erectile dysfunction due to arterial insufficiency: Secondary | ICD-10-CM | POA: Diagnosis not present

## 2020-10-09 DIAGNOSIS — N400 Enlarged prostate without lower urinary tract symptoms: Secondary | ICD-10-CM | POA: Diagnosis not present

## 2020-10-10 ENCOUNTER — Telehealth: Payer: Self-pay

## 2020-10-10 NOTE — Telephone Encounter (Signed)
  Follow up Call-  Call back number 10/08/2020 02/06/2019  Post procedure Call Back phone  # [phone]  Permission to leave phone message Yes Yes  Some recent data might be hidden     Patient questions:  Do you have a fever, pain , or abdominal swelling? No. Pain Score  0 *  Have you tolerated food without any problems? Yes.    Have you been able to return to your normal activities? Yes.    Do you have any questions about your discharge instructions: Diet   No. Medications  No. Follow up visit  No.  Do you have questions or concerns about your Care? No.  Actions: * If pain score is 4 or above: No action needed, pain <4.  Have you developed a fever since your procedure? no  2.   Have you had an respiratory symptoms (SOB or cough) since your procedure? no  3.   Have you tested positive for COVID 19 since your procedure no  4.   Have you had any family members/close contacts diagnosed with the COVID 19 since your procedure?  no   If yes to any of these questions please route to Joylene John, RN and Joella Prince, RN

## 2020-10-15 ENCOUNTER — Inpatient Hospital Stay: Payer: Medicare Other | Attending: Family

## 2020-10-15 ENCOUNTER — Other Ambulatory Visit: Payer: Self-pay

## 2020-10-15 ENCOUNTER — Encounter: Payer: Self-pay | Admitting: Family

## 2020-10-15 ENCOUNTER — Inpatient Hospital Stay: Payer: Medicare Other | Admitting: Family

## 2020-10-15 ENCOUNTER — Telehealth: Payer: Self-pay | Admitting: *Deleted

## 2020-10-15 VITALS — BP 146/93 | HR 56 | Temp 98.9°F | Resp 17 | Ht 73.0 in | Wt 209.0 lb

## 2020-10-15 DIAGNOSIS — D61818 Other pancytopenia: Secondary | ICD-10-CM

## 2020-10-15 DIAGNOSIS — T510X1A Toxic effect of ethanol, accidental (unintentional), initial encounter: Secondary | ICD-10-CM | POA: Insufficient documentation

## 2020-10-15 DIAGNOSIS — Z79899 Other long term (current) drug therapy: Secondary | ICD-10-CM | POA: Diagnosis not present

## 2020-10-15 DIAGNOSIS — R531 Weakness: Secondary | ICD-10-CM | POA: Insufficient documentation

## 2020-10-15 LAB — CMP (CANCER CENTER ONLY)
ALT: 33 U/L (ref 0–44)
AST: 24 U/L (ref 15–41)
Albumin: 4.2 g/dL (ref 3.5–5.0)
Alkaline Phosphatase: 102 U/L (ref 38–126)
Anion gap: 7 (ref 5–15)
BUN: 19 mg/dL (ref 8–23)
CO2: 29 mmol/L (ref 22–32)
Calcium: 10.1 mg/dL (ref 8.9–10.3)
Chloride: 103 mmol/L (ref 98–111)
Creatinine: 1.28 mg/dL — ABNORMAL HIGH (ref 0.61–1.24)
GFR, Estimated: 59 mL/min — ABNORMAL LOW (ref >60)
Glucose, Bld: 109 mg/dL — ABNORMAL HIGH (ref 70–99)
Potassium: 4.9 mmol/L (ref 3.5–5.1)
Sodium: 139 mmol/L (ref 135–145)
Total Bilirubin: 0.8 mg/dL (ref 0.3–1.2)
Total Protein: 6.4 g/dL — ABNORMAL LOW (ref 6.5–8.1)

## 2020-10-15 LAB — CBC WITH DIFFERENTIAL (CANCER CENTER ONLY)
Abs Immature Granulocytes: 0.02 10*3/uL (ref 0.00–0.07)
Basophils Absolute: 0 10*3/uL (ref 0.0–0.1)
Basophils Relative: 1 %
Eosinophils Absolute: 0.2 10*3/uL (ref 0.0–0.5)
Eosinophils Relative: 4 %
HCT: 39 % (ref 39.0–52.0)
Hemoglobin: 13.6 g/dL (ref 13.0–17.0)
Immature Granulocytes: 0 %
Lymphocytes Relative: 34 %
Lymphs Abs: 2.1 10*3/uL (ref 0.7–4.0)
MCH: 32.1 pg (ref 26.0–34.0)
MCHC: 34.9 g/dL (ref 30.0–36.0)
MCV: 92 fL (ref 80.0–100.0)
Monocytes Absolute: 0.4 10*3/uL (ref 0.1–1.0)
Monocytes Relative: 7 %
Neutro Abs: 3.4 10*3/uL (ref 1.7–7.7)
Neutrophils Relative %: 54 %
Platelet Count: 195 10*3/uL (ref 150–400)
RBC: 4.24 MIL/uL (ref 4.22–5.81)
RDW: 13.5 % (ref 11.5–15.5)
WBC Count: 6.2 10*3/uL (ref 4.0–10.5)
nRBC: 0 % (ref 0.0–0.2)

## 2020-10-15 LAB — SAVE SMEAR(SSMR), FOR PROVIDER SLIDE REVIEW

## 2020-10-15 LAB — LACTATE DEHYDROGENASE: LDH: 129 U/L (ref 98–192)

## 2020-10-15 NOTE — Progress Notes (Signed)
Hematology and Oncology Follow Up Visit  Jeffrey Acosta [id number] 01-02-1951 70 y.o. 10/15/2020   Principle Diagnosis:  Intermittent pancytopenia   Current Therapy:   Observation   Interim History:  [name] is here today for follow-up. He is doing well at this time and has no complaints.  He had a fall in January resulting in moderate to large [place] with extra-atrial hemorrhage most notable in the brain stem and in the suprasellar cistern and suspected aneurysmal rupture. He had an episode during admission where he was found down outside while smoking and MRI showed small acute infarcts in the left basal ganglia, left periatrial temporal lobe and high bilateral frontoparietal regions. He has been in PT since that time and states that he is getting around nicely but still has some mild weakness in his arms and legs.  He is doing well on Toprol-XL and baby aspirin daily.  He has not noted any blood loss, bruising or petechiae.  CBC today is excellent. No pancytopenia noted.  He has not had any alcohol in 6 months!!! This is so exciting and we are very proud of him!!! No fever, chills, n/v, cough, rash, dizziness, SOB, chest pain, palpitations, abdominal pain or changes in bowel or bladder habits.  No swelling, tenderness, numbness or tingling in his extremities.  No new falls, no syncope to report.  He has maintained a good appetite and is staying well hydrated. His weight is stable at 209 lbs.   ECOG Performance Status: 0 - Asymptomatic  Medications:  Allergies as of 10/15/2020       Reactions   Omnipaque [iohexol] Hives    Desc: developed hives after injection; resolved with 50 mg benadryl given to pt.        Medication List        Accurate as of October 15, 2020 11:44 AM. If you have any questions, ask your nurse or doctor.          allopurinol 300 MG tablet Commonly known as: ZYLOPRIM Take 1 tablet (300 mg total) by mouth daily.   aspirin EC 81 MG tablet Take 81 mg  by mouth daily. Swallow whole.   b complex vitamins capsule Take 1 capsule by mouth daily.   esomeprazole 40 MG capsule Commonly known as: NEXIUM Take 1 capsule (40 mg total) by mouth daily before breakfast.   folic acid 1 MG tablet Commonly known as: FOLVITE Take 1 tablet (1 mg total) by mouth daily.   lamoTRIgine 25 MG tablet Commonly known as: LAMICTAL Take 25 mg by mouth daily.   levothyroxine 50 MCG tablet Commonly known as: SYNTHROID Take 1 tablet (50 mcg total) by mouth daily before breakfast.   metoprolol succinate 100 MG 24 hr tablet Commonly known as: TOPROL-XL Take 1.5 tablets (150 mg total) by mouth daily. Take with or immediately following a meal.   multivitamin with minerals Tabs tablet Take 1 tablet by mouth daily.   naltrexone 50 MG tablet Commonly known as: DEPADE Take 50 mg by mouth daily.   QUEtiapine 400 MG tablet Commonly known as: SEROQUEL Take 800 mg by mouth at bedtime.   tamsulosin 0.4 MG Caps capsule Commonly known as: FLOMAX Take 1 capsule (0.4 mg total) by mouth daily after supper.   timolol 0.5 % ophthalmic solution Commonly known as: TIMOPTIC 1 drop 2 (two) times daily.   thiamine 100 MG tablet TAKE 1 TABLET (100 MG TOTAL) BY MOUTH DAILY.   vitamin B-1 250 MG tablet Take 1 tablet (  250 mg total) by mouth daily.   vitamin B-12 500 MCG tablet Commonly known as: CYANOCOBALAMIN Take 500 mcg by mouth daily.   Vitamin D 50 MCG (2000 UT) tablet Take 2,000 Units by mouth daily.        Allergies:  Allergies  Allergen Reactions   Omnipaque [Iohexol] Hives     Desc: developed hives after injection; resolved with 50 mg benadryl given to pt.     Past Medical History, Surgical history, Social history, and Family History were reviewed and updated.  Review of Systems: All other 10 point review of systems is negative.   Physical Exam:  vitals were not taken for this visit.   Wt Readings from Last 3 Encounters:  10/08/20 209 lb  (94.8 kg)  09/24/20 209 lb (94.8 kg)  08/21/20 208 lb 2 oz (94.4 kg)    Ocular: Sclerae unicteric, pupils equal, round and reactive to light Ear-nose-throat: Oropharynx clear, dentition fair Lymphatic: No cervical or supraclavicular adenopathy Lungs no rales or rhonchi, good excursion bilaterally Heart regular rate and rhythm, no murmur appreciated Abd soft, nontender, positive bowel sounds MSK no focal spinal tenderness, no joint edema Neuro: non-focal, well-oriented, appropriate affect Breasts: Deferred   Lab Results  Component Value Date   WBC 6.2 10/15/2020   HGB 13.6 10/15/2020   HCT 39.0 10/15/2020   MCV 92.0 10/15/2020   PLT 195 10/15/2020   Lab Results  Component Value Date   FERRITIN 178 07/19/2018   IRON 116 07/19/2018   TIBC 319 07/19/2018   UIBC 203 07/19/2018   IRONPCTSAT 36 07/19/2018   Lab Results  Component Value Date   RETICCTPCT 2.3 03/19/2020   RBC 4.24 10/15/2020   RETICCTABS 83,950 03/19/2020   No results found for: KPAFRELGTCHN, LAMBDASER, KAPLAMBRATIO No results found for: IGGSERUM, IGA, IGMSERUM No results found for: Odetta Pink, SPEI   Chemistry      Component Value Date/Time   NA 140 08/21/2020 1015   NA 140 07/17/2015 0000   K 4.3 08/21/2020 1015   CL 105 08/21/2020 1015   CO2 27 08/21/2020 1015   BUN 15 08/21/2020 1015   BUN 9 07/17/2015 0000   CREATININE 1.20 08/21/2020 1015   CREATININE 1.20 04/16/2020 1321   GLU 95 07/17/2015 0000      Component Value Date/Time   CALCIUM 9.4 08/21/2020 1015   ALKPHOS 52 05/22/2020 1210   AST 16 05/22/2020 1210   AST 24 04/16/2020 1321   ALT 15 05/22/2020 1210   ALT 18 04/16/2020 1321   BILITOT 0.7 05/22/2020 1210   BILITOT 0.6 04/16/2020 1321       Impression and Plan: 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 His counts at  this time are unremarkable. He has stopped drinking and is doing very well.  Lab work reviewed with Dr. Marin Olp and at this time we can let him go from our clinic to follow-up with his PCP.  We will gladly see him again for any future heme/onc issues. He can contact our office with any questions or concerns.   Laverna Peace, NP 6/21/202211:44 AM

## 2020-10-15 NOTE — Telephone Encounter (Signed)
Per 10/15/20 los - no follow up needed

## 2020-10-16 ENCOUNTER — Encounter: Payer: Self-pay | Admitting: Internal Medicine

## 2020-10-22 NOTE — Progress Notes (Signed)
Carelink Summary Report / Loop Recorder 

## 2020-10-23 ENCOUNTER — Encounter: Payer: Self-pay | Admitting: Neurology

## 2020-10-23 ENCOUNTER — Ambulatory Visit: Payer: Medicare Other | Admitting: Neurology

## 2020-10-23 ENCOUNTER — Other Ambulatory Visit: Payer: Medicare Other

## 2020-10-23 VITALS — BP 150/81 | HR 74 | Ht 73.0 in | Wt 205.2 lb

## 2020-10-23 DIAGNOSIS — G3184 Mild cognitive impairment, so stated: Secondary | ICD-10-CM

## 2020-10-23 DIAGNOSIS — R413 Other amnesia: Secondary | ICD-10-CM

## 2020-10-23 DIAGNOSIS — S066X0S Traumatic subarachnoid hemorrhage without loss of consciousness, sequela: Secondary | ICD-10-CM

## 2020-10-23 DIAGNOSIS — I609 Nontraumatic subarachnoid hemorrhage, unspecified: Secondary | ICD-10-CM

## 2020-10-23 NOTE — Progress Notes (Signed)
[facility] [address] [place]. [name] [address] [phone]       OFFICE FOLLOW-UP NOTE  [name] Date of Birth:  04/26/1951 Medical Record Number:  [id number]   HPI: Jeffrey Acosta is a 70 year old Caucasian male seen today for initial office follow-up visit following hospital admission for subarachnoid hemorrhage in January 2022.  History is obtained from the patient and his wife is accompanying him today as well as review of electronic medical records and personally reviewed pertinent imaging films in PACS.  He has past medical history of bipolar disorder, hypothyroidism, diverticulosis, gastroesophageal reflux disease, hyperlipidemia, history of pia, atrial fibrillation and alcohol and tobacco abuse.  He was admitted on 04/30/2020 to [facility] when he was found to collapsed suddenly at home.  CT scan of the head showed moderate to large amount of acute subarachnoid hemorrhage felt to be consistent with a ruptured aneurysm with small amount of bilateral intraventricular hemorrhage.  CT angiogram was negative for aneurysm or AV malformation.  Patient was managed conservatively and was doing well until 05/11/2020 when he wandered off from the inpatient room for approximately 2 hours.  Patient later reported he had gone to smoke a cigarette but he was found unresponsive and hypothermic outside on the hospital campus.  CT scan of the head showed further regression of the bilateral extra-axial hemorrhage with resolved intraventricular blood.  The patient's family subsequently had reported that the previous day they noticed change in mental status with aphasia and inattention to conversation and decreased responsiveness.  MRI scan of the brain was obtained which showed multiple small acute infarcts in the left basal ganglia, left periatrial temporal lobe, bilateral frontoparietal regions with no associated edema or mass-effect.  EEG showed moderate diffuse encephalopathy  nonspecific in etiology without seizures.  2D echo showed ejection fraction of 55 to 60%.  SARS coronavirus test was negative.  LDL was low at 23 mg percent and hemoglobin A1c was 3.9.  Urine drug screen was negative.  Patient was found to have temperature and elevated white count and started on Zosyn and vancomycin.  He was started on nimodipine for vasospasm prophylaxis.  He underwent diagnostic cerebral catheter angiogram by Dr. Kathyrn Sheriff which was negative for any aneurysm or AV malformation.  Patient informs me that he has atrial fibrillation but was only on aspirin prior to admission.  However I do not find documentation of this in the hospital records.  He had a Zio patch as an outpatient and he has a appointment to discuss results with cardiologist Dr. Audie Box later this week.  Review of his previous physician office visits mention atrial tachycardia diagnosed in December 2020 but no definite mention of A. fib was made.  Patient states is done well since discharge to home._Outpatient physical occupational speech therapy.  His speech is mostly back to normal though still has some occasional word hesitancy and word finding difficulties.  He wants to start driving.  Is fully independent in all activities of daily living.  He has no complaints. Update 10/23/2020: He returns for follow-up after last visit 3 months ago.  Patient denies any headaches or stroke or TIA symptoms.  His main complaint today is short-term memory which seems to be getting worse.  He has trouble remembering recent information but can remember things from the past well.  He still independent and manages her own affairs.  Complains of occasional feeling of lightheadedness and dizziness but that does not last long.  He also  complains of leg aches and pains occasionally feels his balance may also be affected but this does not stay long.  He does have family history of and her grandmother had dementia and worries about it.  He did undergo a  loop recorder insertion and so for paroxysmal A. fib has not yet been found.  He is tolerating aspirin well without bruising or bleeding.  Blood pressure usually well controlled today it is slightly elevated at 150/81. ROS:   14 system review of systems is positive for memory loss, lightheadedness, dizziness, leg ache and pain, leg weakness altered consciousness, aphasia, weakness, confusion, word finding difficulties all other systems negative  PMH:  Past Medical History:  Diagnosis Date   Alcoholism in remission ([place])    in AA   Arrhythmia    atrial tachycardia   Bipolar affective disorder ([place])    Diverticulosis of colon (without mention of hemorrhage)    GERD (gastroesophageal reflux disease)    Gout of multiple sites    Hiatal hernia    Hx of migraines    Hydrocele    HYPERLIPIDEMIA 01/10/2009   Qualifier: Diagnosis of  By: Ronnald Ramp CMA, [place]    NMR Lipoprofile 2011 LDL could not be calculated  Due to TG of 889  (811  / 750 ),  HDL 50 . Father MI @ 23 PGM CVA early 71s; PGF CVA @72     Hypothyroidism    Melanoma of skin, site unspecified 11/25/2012   07/2011 abdominal  Stage 1 Dr Grilli Lacy [place] Dr Sarajane Jews    Osteopenia    Polyarthralgia    Stroke [facility])    Subarachnoid hemorrhage [facility])     Social History:  Social History   Socioeconomic History   Marital status: Married    Spouse name: Jeffrey Acosta   Number of children: 3   Years of education: Not on file   Highest education level: Not on file  Occupational History   Occupation: retired  Tobacco Use   Smoking status: Never   Smokeless tobacco: Never  Vaping Use   Vaping Use: Never used  Substance and Sexual Activity   Alcohol use: Not Currently    Alcohol/week: 0.0 standard drinks    Comment: currently sober since 04/2020   Drug use: No   Sexual activity: Yes    Partners: Female    Comment: Having some issues with ED.  Would like provider's recommendation.    Other Topics Concern   Not on file  Social History Narrative    Lives with Wife, Jeffrey Acosta   Right Handed   Drinks 4-5 cups caffeine/week   Social Determinants of Health   Financial Resource Strain: Not on file  Food Insecurity: Not on file  Transportation Needs: Not on file  Physical Activity: Not on file  Stress: Not on file  Social Connections: Not on file  Intimate Partner Violence: Not on file    Medications:   Current Outpatient Medications on File Prior to Visit  Medication Sig Dispense Refill   allopurinol (ZYLOPRIM) 300 MG tablet Take 1 tablet (300 mg total) by mouth daily. 90 tablet 1   aspirin EC 81 MG tablet Take 81 mg by mouth daily. Swallow whole.     b complex vitamins capsule Take 1 capsule by mouth daily.     Cholecalciferol (VITAMIN D) 50 MCG (2000 UT) tablet Take 2,000 Units by mouth daily.     esomeprazole (NEXIUM) 40 MG capsule Take 1 capsule (40 mg total) by mouth daily before breakfast. 90  capsule 3   folic acid (FOLVITE) 1 MG tablet Take 1 tablet (1 mg total) by mouth daily. 90 tablet 3   lamoTRIgine (LAMICTAL) 25 MG tablet Take 25 mg by mouth daily.     levothyroxine (SYNTHROID) 50 MCG tablet Take 1 tablet (50 mcg total) by mouth daily before breakfast. 90 tablet 1   metoprolol succinate (TOPROL-XL) 100 MG 24 hr tablet Take 1.5 tablets (150 mg total) by mouth daily. Take with or immediately following a meal. 135 tablet 1   Multiple Vitamin (MULTIVITAMIN WITH MINERALS) TABS tablet Take 1 tablet by mouth daily.     naltrexone (DEPADE) 50 MG tablet Take 50 mg by mouth daily.   12   QUEtiapine (SEROQUEL) 400 MG tablet Take 800 mg by mouth at bedtime.     thiamine 100 MG tablet TAKE 1 TABLET (100 MG TOTAL) BY MOUTH DAILY. 30 tablet 0   Thiamine HCl (VITAMIN B-1) 250 MG tablet Take 1 tablet (250 mg total) by mouth daily. 90 tablet 3   timolol (TIMOPTIC) 0.5 % ophthalmic solution 1 drop 2 (two) times daily.     vitamin B-12 (CYANOCOBALAMIN) 500 MCG tablet Take 500 mcg by mouth daily.     tamsulosin (FLOMAX) 0.4 MG CAPS capsule Take 1  capsule (0.4 mg total) by mouth daily after supper. 30 capsule 3   Current Facility-Administered Medications on File Prior to Visit  Medication Dose Route Frequency Provider Last Rate Last Admin   lidocaine-EPINEPHrine (XYLOCAINE W/EPI) 1 %-1:100000 (with pres) injection 10 mL  10 mL Infiltration Once Croitoru, Mihai, MD        Allergies:   Allergies  Allergen Reactions   Omnipaque [Iohexol] Hives     Desc: developed hives after injection; resolved with 50 mg benadryl given to pt.     Physical Exam General: well developed, well nourished elderly Caucasian male, seated, in no evident distress Head: head normocephalic and atraumatic.  Neck: supple with no carotid or supraclavicular bruits Cardiovascular: regular rate and rhythm, no murmurs Musculoskeletal: no deformity Skin:  no rash/petichiae Vascular:  Normal pulses all extremities Vitals:   10/23/20 1254  BP: (!) 150/81  Pulse: 74   Neurologic Exam Mental Status: Awake and fully alert. Oriented to place and time. Recent and remote memory slightly diminished.  Attention span, concentration and fund of knowledge appropriate. Mood and affect appropriate.  Diminished recall 2/3.  Able to name 15 animals which can walk on 4 legs.  Clock drawing 4/4. Cranial Nerves: Fundoscopic exam reveals sharp disc margins. Pupils equal, briskly reactive to light. Extraocular movements full without nystagmus. Visual fields full to confrontation. Hearing intact. Facial sensation intact. Face, tongue, palate moves normally and symmetrically.  Motor: Normal bulk and tone. Normal strength in all tested extremity muscles. Sensory.: intact to touch ,pinprick .position and vibratory sensation.  Coordination: Rapid alternating movements normal in all extremities. Finger-to-nose and heel-to-shin performed accurately bilaterally. Gait and Station: Arises from chair without difficulty. Stance is normal. Gait demonstrates normal stride length and balance . Able  to heel, toe and tandem walk with moderate difficulty.  Reflexes: 1+ and symmetric. Toes downgoing.      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  Doubt significant vasospasm contributing as it was not clearly documented on transcranial Doppler study and many of the infarcts were subcortical.  New complaints of mild short-term memory and cognitive difficulties likely from mild  cognitive impairment.    PLAN: I had a long discussion with the patient regarding his new complaints of memory loss and cognitive impairment and recommend further evaluation by checking lab work for reversible causes, EEG and MRI scan of the brain as well as MR angiogram.  I encouraged him to increase participation in cognitively challenging activities like solving crossword puzzles, playing bridge and sodoku.  We also discussed memory compensation strategies.  Advised him to continue aspirin for stroke prevention and maintain aggressive risk factor modification.  He will return for follow-up in the future in 3 months or call earlier if necessary. Greater than 50% of time during prolonged 40-minute visit minute visit was spent on counseling,explanation of diagnosis of subarachnoid hemorrhage, memory loss and mild cognitive impairment and stroke risk, planning of further management, discussion with patient and family and coordination of care Antony Contras, MD Note: This document was prepared with digital dictation and possible smart phrase technology. Any transcriptional errors that result from this process are unintentional

## 2020-10-23 NOTE — Patient Instructions (Addendum)
I had a long discussion with the patient regarding his new complaints of memory loss and cognitive impairment and recommend further evaluation by checking lab work for reversible causes, EEG and MRI scan of the brain as well as MR angiogram.  I encouraged him to increase participation in cognitively challenging activities like solving crossword puzzles, playing bridge and sodoku.  We also discussed memory compensation strategies.  Advised him to continue aspirin for stroke prevention and maintain aggressive risk factor modification.  He will return for follow-up in the future in 3 months or call earlier if necessary. Memory Compensation Strategies  Use "WARM" strategy.  W= write it down  A= associate it  R= repeat it  M= make a mental note  2.   You can keep a Social worker.  Use a 3-ring notebook with sections for the following: calendar, important names and phone numbers,  medications, doctors' names/phone numbers, lists/reminders, and a section to journal what you did  each day.   3.    Use a calendar to write appointments down.  4.    Write yourself a schedule for the day.  This can be placed on the calendar or in a separate section of the Memory Notebook.  Keeping a  regular schedule can help memory.  5.    Use medication organizer with sections for each day or morning/evening pills.  You may need help loading it  6.    Keep a basket, or pegboard by the door.  Place items that you need to take out with you in the basket or on the pegboard.  You may also want to  include a message board for reminders.  7.    Use sticky notes.  Place sticky notes with reminders in a place where the task is performed.  For example: " turn off the  stove" placed by the stove, "lock the door" placed on the door at eye level, " take your medications" on  the bathroom mirror or by the place where you normally take your medications.  8.    Use alarms/timers.  Use while cooking to remind yourself to check on  food or as a reminder to take your medicine, or as a  reminder to make a call, or as a reminder to perform another task, etc.

## 2020-10-24 ENCOUNTER — Telehealth: Payer: Self-pay | Admitting: Neurology

## 2020-10-24 LAB — DEMENTIA PANEL
Homocysteine: 13 umol/L (ref 0.0–19.2)
RPR Ser Ql: NONREACTIVE
TSH: 0.957 u[IU]/mL (ref 0.450–4.500)
Vitamin B-12: 863 pg/mL (ref 232–1245)

## 2020-10-24 NOTE — Telephone Encounter (Signed)
MRI brain w/wo contrast & MR angio head wo contrast [employer] auth: NPR  Sent to GI for scheduling

## 2020-10-25 NOTE — Progress Notes (Signed)
Kindly inform the patient that lab work for reversible causes of memory loss was all normal

## 2020-10-29 ENCOUNTER — Encounter: Payer: Self-pay | Admitting: *Deleted

## 2020-11-02 ENCOUNTER — Other Ambulatory Visit: Payer: Self-pay

## 2020-11-02 ENCOUNTER — Ambulatory Visit
Admission: RE | Admit: 2020-11-02 | Discharge: 2020-11-02 | Disposition: A | Discharge status: Home / Self Care | Payer: Medicare Other | Source: Ambulatory Visit | Attending: Neurology | Admitting: Neurology

## 2020-11-02 DIAGNOSIS — R413 Other amnesia: Secondary | ICD-10-CM | POA: Diagnosis not present

## 2020-11-02 DIAGNOSIS — S066X0S Traumatic subarachnoid hemorrhage without loss of consciousness, sequela: Secondary | ICD-10-CM | POA: Diagnosis not present

## 2020-11-02 DIAGNOSIS — I609 Nontraumatic subarachnoid hemorrhage, unspecified: Secondary | ICD-10-CM | POA: Diagnosis not present

## 2020-11-02 MED ORDER — GADOBENATE DIMEGLUMINE 529 MG/ML IV SOLN
19.0000 mL | Freq: Once | INTRAVENOUS | Status: AC | PRN
  Administered 2020-11-02: 19 mL via INTRAVENOUS

## 2020-11-03 LAB — CUP PACEART REMOTE DEVICE CHECK
Date Time Interrogation Session: 20220707142257
Implantable Pulse Generator Implant Date: 20220502

## 2020-11-04 ENCOUNTER — Ambulatory Visit (INDEPENDENT_AMBULATORY_CARE_PROVIDER_SITE_OTHER): Payer: Medicare Other

## 2020-11-04 DIAGNOSIS — I639 Cerebral infarction, unspecified: Secondary | ICD-10-CM

## 2020-11-07 ENCOUNTER — Encounter: Payer: Self-pay | Admitting: *Deleted

## 2020-11-12 ENCOUNTER — Ambulatory Visit: Payer: Medicare Other | Admitting: Neurology

## 2020-11-12 DIAGNOSIS — R413 Other amnesia: Secondary | ICD-10-CM

## 2020-11-12 DIAGNOSIS — R410 Disorientation, unspecified: Secondary | ICD-10-CM | POA: Diagnosis not present

## 2020-11-15 ENCOUNTER — Telehealth: Payer: Self-pay

## 2020-11-15 NOTE — Telephone Encounter (Signed)
LINQ alert received. 3 events labeled AF, each 48mn during sleeping hrs.  Rates 115-130 AF vs tachycardia, no OAC.  Pt implanted for CVA, no AF has been identified to date.

## 2020-11-25 NOTE — Progress Notes (Signed)
Carelink Summary Report / Loop Recorder 

## 2020-11-27 ENCOUNTER — Other Ambulatory Visit: Payer: Self-pay

## 2020-11-27 ENCOUNTER — Encounter: Payer: Self-pay | Admitting: Internal Medicine

## 2020-11-27 ENCOUNTER — Ambulatory Visit (HOSPITAL_BASED_OUTPATIENT_CLINIC_OR_DEPARTMENT_OTHER)
Admission: RE | Admit: 2020-11-27 | Discharge: 2020-11-27 | Disposition: A | Discharge status: Home / Self Care | Payer: Medicare Other | Source: Ambulatory Visit | Attending: Internal Medicine | Admitting: Internal Medicine

## 2020-11-27 ENCOUNTER — Ambulatory Visit (INDEPENDENT_AMBULATORY_CARE_PROVIDER_SITE_OTHER): Payer: Medicare Other | Admitting: Internal Medicine

## 2020-11-27 VITALS — BP 132/70 | HR 75 | Temp 98.0°F | Resp 16 | Ht 73.0 in | Wt 200.0 lb

## 2020-11-27 DIAGNOSIS — R1010 Upper abdominal pain, unspecified: Secondary | ICD-10-CM

## 2020-11-27 DIAGNOSIS — K8000 Calculus of gallbladder with acute cholecystitis without obstruction: Secondary | ICD-10-CM

## 2020-11-27 DIAGNOSIS — R7989 Other specified abnormal findings of blood chemistry: Secondary | ICD-10-CM

## 2020-11-27 DIAGNOSIS — D61818 Other pancytopenia: Secondary | ICD-10-CM

## 2020-11-27 DIAGNOSIS — K2270 Barrett's esophagus without dysplasia: Secondary | ICD-10-CM | POA: Diagnosis not present

## 2020-11-27 DIAGNOSIS — R109 Unspecified abdominal pain: Secondary | ICD-10-CM | POA: Diagnosis not present

## 2020-11-27 DIAGNOSIS — K5939 Other megacolon: Secondary | ICD-10-CM | POA: Diagnosis not present

## 2020-11-27 LAB — CBC WITH DIFFERENTIAL/PLATELET
Basophils Absolute: 0 10*3/uL (ref 0.0–0.1)
Basophils Relative: 0.3 % (ref 0.0–3.0)
Eosinophils Absolute: 0.1 10*3/uL (ref 0.0–0.7)
Eosinophils Relative: 0.7 % (ref 0.0–5.0)
HCT: 36.6 % — ABNORMAL LOW (ref 39.0–52.0)
Hemoglobin: 12.6 g/dL — ABNORMAL LOW (ref 13.0–17.0)
Lymphocytes Relative: 12.3 % (ref 12.0–46.0)
Lymphs Abs: 1.2 10*3/uL (ref 0.7–4.0)
MCHC: 34.5 g/dL (ref 30.0–36.0)
MCV: 97.9 fl (ref 78.0–100.0)
Monocytes Absolute: 0.6 10*3/uL (ref 0.1–1.0)
Monocytes Relative: 6.1 % (ref 3.0–12.0)
Neutro Abs: 8 10*3/uL — ABNORMAL HIGH (ref 1.4–7.7)
Neutrophils Relative %: 80.6 % — ABNORMAL HIGH (ref 43.0–77.0)
Platelets: 221 10*3/uL (ref 150.0–400.0)
RBC: 3.73 Mil/uL — ABNORMAL LOW (ref 4.22–5.81)
RDW: 15 % (ref 11.5–15.5)
WBC: 9.9 10*3/uL (ref 4.0–10.5)

## 2020-11-27 LAB — COMPREHENSIVE METABOLIC PANEL
ALT: 58 U/L — ABNORMAL HIGH (ref 0–53)
AST: 56 U/L — ABNORMAL HIGH (ref 0–37)
Albumin: 3.8 g/dL (ref 3.5–5.2)
Alkaline Phosphatase: 260 U/L — ABNORMAL HIGH (ref 39–117)
BUN: 15 mg/dL (ref 6–23)
CO2: 27 mEq/L (ref 19–32)
Calcium: 9.5 mg/dL (ref 8.4–10.5)
Chloride: 101 mEq/L (ref 96–112)
Creatinine, Ser: 1.18 mg/dL (ref 0.40–1.50)
GFR: 61.79 mL/min (ref >60.00)
Glucose, Bld: 106 mg/dL — ABNORMAL HIGH (ref 70–99)
Potassium: 5 mEq/L (ref 3.5–5.1)
Sodium: 136 mEq/L (ref 135–145)
Total Bilirubin: 4.7 mg/dL — ABNORMAL HIGH (ref 0.2–1.2)
Total Protein: 6 g/dL (ref 6.0–8.3)

## 2020-11-27 MED ORDER — DICYCLOMINE HCL 20 MG PO TABS: 20.0000 mg | ORAL_TABLET | Freq: Three times a day (TID) | ORAL | 0 refills | Status: DC | PRN

## 2020-11-27 NOTE — Patient Instructions (Signed)
ER if symptoms severe  GO TO THE LAB : Get the blood work       STOP BY THE FIRST FLOOR:  get the XR

## 2020-11-27 NOTE — Progress Notes (Signed)
Subjective:    Patient ID: Jeffrey Acosta, male    DOB: [birth year], 70 y.o.   MRN: [id number]  DOS:  11/27/2020 Type of visit - description: Acute  His main complaint today is right upper quadrant abdominal pain. Symptoms started shortly after he had a EGD 10/08/2020. Initially episodes were once a week but now he has 2 or 3 a day. They may last 2 hours to 5 hours. They do not radiate to the middle of the chest. No associated with nausea or vomiting.  Triggers?  Symptoms are really random, can be when he is driving, laying down in bed, not clearly related to food intake. Sometimes cough, sneezing of the breathing increase the pain.   Review of Systems No fever chills. No change in the color of the stools. No recent dysphagia or odynophagia. No actual heartburn. No cough No SS CP  Past Medical History:  Diagnosis Date   Alcoholism in remission ([place])    in AA   Arrhythmia    atrial tachycardia   Bipolar affective disorder ([place])    Diverticulosis of colon (without mention of hemorrhage)    GERD (gastroesophageal reflux disease)    Gout of multiple sites    Hiatal hernia    Hx of migraines    Hydrocele    HYPERLIPIDEMIA 01/10/2009   Qualifier: Diagnosis of  By: Ronnald Ramp CMA, Chemira    NMR Lipoprofile 2011 LDL could not be calculated  Due to TG of 889  (811  / 750 ),  HDL 50 . Father MI @ 106 PGM CVA early 31s; PGF CVA '@72'$     Hypothyroidism    Melanoma of skin, site unspecified 11/25/2012   07/2011 abdominal  Stage 1 Dr Mittelstaedt Lacy [place] Dr Sarajane Jews    Osteopenia    Polyarthralgia    Stroke [facility])    Subarachnoid hemorrhage [facility])     Past Surgical History:  Procedure Laterality Date   BILIARY BRUSHING  12/07/2018   Procedure: BILIARY BRUSHING;  Surgeon: Irving Copas., MD;  Location: [facility];  Service: Gastroenterology;;   BILIARY DILATION  12/07/2018   Procedure: BILIARY DILATION;  Surgeon: Irving Copas., MD;  Location: [facility];  Service:  Gastroenterology;;   BIOPSY  12/07/2018   Procedure: BIOPSY;  Surgeon: Irving Copas., MD;  Location: [facility];  Service: Gastroenterology;;   COLONOSCOPY  2003   negative ; Dr Sharlett Iles   ERCP N/A 12/07/2018   Procedure: ENDOSCOPIC RETROGRADE CHOLANGIOPANCREATOGRAPHY (ERCP);  Surgeon: Irving Copas., MD;  Location: [facility];  Service: Gastroenterology;  Laterality: N/A;   ESOPHAGOGASTRODUODENOSCOPY (EGD) WITH PROPOFOL N/A 12/07/2018   Procedure: ESOPHAGOGASTRODUODENOSCOPY (EGD) WITH PROPOFOL;  Surgeon: Rush Landmark Telford Nab., MD;  [facility]: [facility];  Service: Gastroenterology;  Laterality: N/A;   EUS N/A 12/07/2018   Procedure: UPPER ENDOSCOPIC ULTRASOUND (EUS) RADIAL;  Surgeon: Irving Copas., MD;  [facility]: [facility];  Service: Gastroenterology;  Laterality: N/A;   INGUINAL HERNIA REPAIR  08/07/2011   Procedure: HERNIA REPAIR INGUINAL ADULT;  Surgeon: Rolm Bookbinder, MD;  Location: [place];  Service: General;  Laterality: Right;   IR ANGIO INTRA EXTRACRAN SEL INTERNAL CAROTID BILAT MOD SED  05/14/2020   IR ANGIO VERTEBRAL SEL VERTEBRAL BILAT MOD SED  05/14/2020   LOOP RECORDER INSERTION     06/2020   MELANOMA EXCISION  07/28/11   Dr Sarajane Jews; stomach and chest   REMOVAL OF STONES  12/07/2018   Procedure: REMOVAL OF STONES;  Surgeon: Irving Copas., MD;  [facility]: [facility];  Service: Gastroenterology;;   TONSILLECTOMY  1951   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   UPPER GASTROINTESTINAL ENDOSCOPY  1983   hiatal hernia   VASECTOMY  1979    Allergies as of 11/27/2020       Reactions   Omnipaque [iohexol] Hives    Desc: developed hives after injection; resolved with 50 mg benadryl given to pt.        Medication List        Accurate as of November 27, 2020 11:59 PM. If you have any questions, ask your nurse or doctor.          allopurinol 300 MG tablet Commonly known as: ZYLOPRIM Take 1 tablet (300  mg total) by mouth daily.   aspirin EC 81 MG tablet Take 81 mg by mouth daily. Swallow whole.   b complex vitamins capsule Take 1 capsule by mouth daily.   dicyclomine 20 MG tablet Commonly known as: BENTYL Take 1 tablet (20 mg total) by mouth 3 (three) times daily as needed for spasms. Started by: Kathlene November, MD   esomeprazole 40 MG capsule Commonly known as: NEXIUM Take 1 capsule (40 mg total) by mouth daily before breakfast.   folic acid 1 MG tablet Commonly known as: FOLVITE Take 1 tablet (1 mg total) by mouth daily.   lamoTRIgine 25 MG tablet Commonly known as: LAMICTAL Take 25 mg by mouth daily.   levothyroxine 50 MCG tablet Commonly known as: SYNTHROID Take 1 tablet (50 mcg total) by mouth daily before breakfast.   metoprolol succinate 100 MG 24 hr tablet Commonly known as: TOPROL-XL Take 1.5 tablets (150 mg total) by mouth daily. Take with or immediately following a meal.   multivitamin with minerals Tabs tablet Take 1 tablet by mouth daily.   naltrexone 50 MG tablet Commonly known as: DEPADE Take 50 mg by mouth daily.   QUEtiapine 400 MG tablet Commonly known as: SEROQUEL Take 800 mg by mouth at bedtime.   tamsulosin 0.4 MG Caps capsule Commonly known as: FLOMAX Take 1 capsule (0.4 mg total) by mouth daily after supper.   timolol 0.5 % ophthalmic solution Commonly known as: TIMOPTIC 1 drop 2 (two) times daily.   thiamine 100 MG tablet TAKE 1 TABLET (100 MG TOTAL) BY MOUTH DAILY.   vitamin B-1 250 MG tablet Take 1 tablet (250 mg total) by mouth daily.   vitamin B-12 500 MCG tablet Commonly known as: CYANOCOBALAMIN Take 500 mcg by mouth daily.   Vitamin D 50 MCG (2000 UT) tablet Take 2,000 Units by mouth daily.           Objective:   Physical Exam Abdominal:       Comments: Area of perceived pain, located at the R UQ, area is a slightly TTP, no rash noted   BP 132/70 (BP Location: Left Arm, Patient Position: Sitting, Cuff Size: Normal)    Pulse 75   Temp 98 F (36.7 C) (Oral)   Resp 16   Ht '6\' 1"'$  (1.854 m)   Wt 200 lb (90.7 kg)   SpO2 96%   BMI 26.39 kg/m  General:   Well developed, NAD, BMI noted.  HEENT:  Normocephalic . Face symmetric, atraumatic Lungs:  CTA B Normal respiratory effort, no intercostal retractions, no accessory muscle use. Heart: RRR,  no murmur.  Abdomen:  Not distended, soft, minimal TTP at the right upper quadrant.  See graphic Skin: Not pale. Not jaundice Lower extremities:  no pretibial edema bilaterally  Neurologic:  alert & oriented X3.  Speech normal, gait appropriate for age and unassisted Psych--  Cognition and judgment appear intact.  Cooperative with normal attention span and concentration.  Behavior appropriate. No anxious or depressed appearing.     Assessment    Assessment  Bipolar disorder-- Dr Toy Care, dc lithium 07-2014 EtOH abuse-- remission w/ occ relapse Hypothyroidism Hyperlipidemia E.D.  Atrial Tachycardia dx 03/2019, + Zio patch,  MSK: ---Back pain: Saw Dr. Ramos/Geoffrey~ 2002, 3 local injections, offered surgery.  ---Gout: Dr Berna Bue, released to PCP 2018 Melanoma 2014 Dr. [place], sees derm regularly as off 02-2020 Osteopenia  T score -2.4  [id number];  T score is -2.0  (08/2018). Vit d def dx 02-2015 GI:  -GERD with Barrett's per EGD 11-2018 - History of diverticulitis -11-2018: Mild to moderate biliary dilation without definitive cause found status post EUS and ERCP with sphincterotomy and negative brushings LUTS  SAH traumatic, follow-up acute stroke 04-2020  PLAN RUQ abdominal pain: As described above, symptoms are getting more frequent, they are intense per patient. This happening shortly after he had a EGD. Has a history of cholelithiasis by ultrasound 08/2018. Suspect symptoms are from cholelithiasis however given recent endoscopy will get CBC, CMP, abdominal x-rays.  Further lab results.  If severe symptoms go to the ER.  Trial with  Bentyl. Addendum:  Abdominal x-ray results reviewed, possible SBO, CT?  No free air, no thorax process. Labs : CBC okay, LFTs including alkaline phosphatase elevated. X-ray did not show any free air or process in the thorax. Suspect symptoms are related to cholelithiasis.  PLAN: Refer to surgery, will ask to be seen within a week.  ER if fever chills, severe pain, nausea or vomiting. Spoke with the patient, he agreed with above, reportedly Bentyl helped last night when he had an episode. 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   Saw neurology 10/23/2020 for  self reported  memory loss, cognitive impairment, they ordered an EEG, MRI head and blood work.    This visit occurred during the SARS-CoV-2 public health emergency.  Safety protocols were in place, including screening questions prior to the visit, additional usage of staff PPE, and extensive cleaning of exam room while observing appropriate contact time as indicated for disinfecting solutions.

## 2020-11-28 NOTE — Assessment & Plan Note (Signed)
RUQ abdominal pain: As described above, symptoms are getting more frequent, they are intense per patient. This happening shortly after he had a EGD. Has a history of cholelithiasis by ultrasound 08/2018. Suspect symptoms are from cholelithiasis however given recent endoscopy will get CBC, CMP, abdominal x-rays.  Further lab results.  If severe symptoms go to the ER.  Trial with Bentyl. Addendum:  Abdominal x-ray results reviewed, possible SBO, CT?  No free air, no thorax process. Labs : CBC okay, LFTs including alkaline phosphatase elevated. X-ray did not show any free air or process in the thorax. Suspect symptoms are related to cholelithiasis.  PLAN: Refer to surgery, will ask to be seen within a week.  ER if fever chills, severe pain, nausea or vomiting. Spoke with the patient, he agreed with above, reportedly Bentyl helped last night when he had an episode. 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   Saw neurology 10/23/2020 for  self reported  memory loss, cognitive impairment, they ordered an EEG, MRI head and blood work.

## 2020-11-28 NOTE — Addendum Note (Signed)
Addended byDamita Dunnings D on: 11/28/2020 09:26 AM   Modules accepted: Orders

## 2020-12-02 ENCOUNTER — Other Ambulatory Visit: Payer: Self-pay | Admitting: Internal Medicine

## 2020-12-02 NOTE — Telephone Encounter (Signed)
PT called wanting to speak to you about getting another refill for his dicyclomine (BENTYL) 20 MG since his procedure is not until Sept 2nd. He would also like to try the other medicine Dr. Larose Kells mentioned, he was not sure of the name. Please advise

## 2020-12-09 ENCOUNTER — Ambulatory Visit (INDEPENDENT_AMBULATORY_CARE_PROVIDER_SITE_OTHER): Payer: Medicare Other

## 2020-12-09 DIAGNOSIS — I639 Cerebral infarction, unspecified: Secondary | ICD-10-CM | POA: Diagnosis not present

## 2020-12-09 LAB — CUP PACEART REMOTE DEVICE CHECK
Date Time Interrogation Session: 20220809142541
Implantable Pulse Generator Implant Date: 20220502

## 2020-12-11 ENCOUNTER — Other Ambulatory Visit: Payer: Self-pay | Admitting: Internal Medicine

## 2020-12-24 ENCOUNTER — Ambulatory Visit (INDEPENDENT_AMBULATORY_CARE_PROVIDER_SITE_OTHER): Payer: Medicare Other | Admitting: Internal Medicine

## 2020-12-24 ENCOUNTER — Encounter: Payer: Self-pay | Admitting: Internal Medicine

## 2020-12-24 ENCOUNTER — Other Ambulatory Visit: Payer: Self-pay

## 2020-12-24 VITALS — BP 128/74 | HR 74 | Temp 98.0°F | Resp 16 | Ht 73.0 in | Wt 193.0 lb

## 2020-12-24 DIAGNOSIS — R1011 Right upper quadrant pain: Secondary | ICD-10-CM

## 2020-12-24 DIAGNOSIS — G3184 Mild cognitive impairment, so stated: Secondary | ICD-10-CM | POA: Diagnosis not present

## 2020-12-24 NOTE — Patient Instructions (Addendum)
Recommend to proceed with the following vaccines at your pharmacy:  Shingrix (shingles) Flu shot this fall    [place] back for a physical exam in 3-4 months

## 2020-12-24 NOTE — Progress Notes (Signed)
Subjective:    Patient ID: Jeffrey Acosta, male    DOB: 09-26-1950, 70 y.o.   MRN: [id number]  DOS:  12/24/2020 Type of visit - description: Follow-up from previous visit. [place] with on and off pain, located at the right anterior chest & RUQ abdomen. Pain is episodic, today she said that sometimes it is related with food and he is avoiding large meals to prevent the pain. Denies fever chills No nausea or vomiting No odynophagia No cough  Also complaining of memory issues, going on gradually for a while.  The wife complains that he forgets frequently what they talk earlier on any given the he forgets to do things at home. Has not gotten lost, has not failed to recognize people   Review of Systems See above   Past Medical History:  Diagnosis Date   Alcoholism in remission ([place])    in AA   Arrhythmia    atrial tachycardia   Bipolar affective disorder ([place])    Diverticulosis of colon (without mention of hemorrhage)    GERD (gastroesophageal reflux disease)    Gout of multiple sites    Hiatal hernia    Hx of migraines    Hydrocele    HYPERLIPIDEMIA 01/10/2009   Qualifier: Diagnosis of  By: Ronnald Ramp CMA, Chemira    NMR Lipoprofile 2011 LDL could not be calculated  Due to TG of 889  (811  / 750 ),  HDL 50 . Father MI @ 26 PGM CVA early 33s; PGF CVA '@72'$     Hypothyroidism    Melanoma of skin, site unspecified 11/25/2012   07/2011 abdominal  Stage 1 Dr [place] Lacy [place] Dr Sarajane Jews    Osteopenia    Polyarthralgia    Stroke [facility])    Subarachnoid hemorrhage [facility])     Past Surgical History:  Procedure Laterality Date   BILIARY BRUSHING  12/07/2018   Procedure: BILIARY BRUSHING;  Surgeon: Irving Copas., MD;  Location: [facility];  Service: Gastroenterology;;   BILIARY DILATION  12/07/2018   Procedure: BILIARY DILATION;  Surgeon: Irving Copas., MD;  Location: [facility];  Service: Gastroenterology;;   BIOPSY  12/07/2018   Procedure: BIOPSY;  Surgeon: Irving Copas., MD;  Location: [facility];  Service: Gastroenterology;;   COLONOSCOPY  2003   negative ; Dr Sharlett Iles   ERCP N/A 12/07/2018   Procedure: ENDOSCOPIC RETROGRADE CHOLANGIOPANCREATOGRAPHY (ERCP);  Surgeon: Irving Copas., MD;  Location: [facility];  Service: Gastroenterology;  Laterality: N/A;   ESOPHAGOGASTRODUODENOSCOPY (EGD) WITH PROPOFOL N/A 12/07/2018   Procedure: ESOPHAGOGASTRODUODENOSCOPY (EGD) WITH PROPOFOL;  Surgeon: Rush Landmark Telford Nab., MD;  [facility]: [facility];  Service: Gastroenterology;  Laterality: N/A;   EUS N/A 12/07/2018   Procedure: UPPER ENDOSCOPIC ULTRASOUND (EUS) RADIAL;  Surgeon: Irving Copas., MD;  [facility]: [facility];  Service: Gastroenterology;  Laterality: N/A;   INGUINAL HERNIA REPAIR  08/07/2011   Procedure: HERNIA REPAIR INGUINAL ADULT;  Surgeon: Rolm Bookbinder, MD;  Location: [place];  Service: General;  Laterality: Right;   IR ANGIO INTRA EXTRACRAN SEL INTERNAL CAROTID BILAT MOD SED  05/14/2020   IR ANGIO VERTEBRAL SEL VERTEBRAL BILAT MOD SED  05/14/2020   LOOP RECORDER INSERTION     06/2020   MELANOMA EXCISION  07/28/11   Dr Sarajane Jews; stomach and chest   REMOVAL OF STONES  12/07/2018   Procedure: REMOVAL OF STONES;  Surgeon: Irving Copas., MD;  Location: [facility];  Service: Gastroenterology;;   TONSILLECTOMY  1951   Undescended  testicle surgery  1950   as infant   UPPER GASTROINTESTINAL ENDOSCOPY  1983   hiatal hernia   VASECTOMY  1979    Allergies as of 12/24/2020       Reactions   Omnipaque [iohexol] Hives    Desc: developed hives after injection; resolved with 50 mg benadryl given to pt.        Medication List        Accurate as of December 24, 2020  4:21 PM. If you have any questions, ask your nurse or doctor.          allopurinol 300 MG tablet Commonly known as: ZYLOPRIM Take 1 tablet (300 mg total) by mouth daily.   aspirin EC 81 MG tablet Take 81 mg by mouth daily. Swallow  whole.   b complex vitamins capsule Take 1 capsule by mouth daily.   dicyclomine 20 MG tablet Commonly known as: BENTYL TAKE 1 TABLET(20 MG) BY MOUTH THREE TIMES DAILY AS NEEDED FOR SPASMS   esomeprazole 40 MG capsule Commonly known as: NEXIUM Take 1 capsule (40 mg total) by mouth daily before breakfast.   folic acid 1 MG tablet Commonly known as: FOLVITE Take 1 tablet (1 mg total) by mouth daily.   lamoTRIgine 25 MG tablet Commonly known as: LAMICTAL Take 25 mg by mouth daily.   levothyroxine 50 MCG tablet Commonly known as: SYNTHROID Take 1 tablet (50 mcg total) by mouth daily before breakfast.   metoprolol succinate 100 MG 24 hr tablet Commonly known as: TOPROL-XL Take 1.5 tablets (150 mg total) by mouth daily. Take with or immediately following a meal.   multivitamin with minerals Tabs tablet Take 1 tablet by mouth daily.   naltrexone 50 MG tablet Commonly known as: DEPADE Take 50 mg by mouth daily.   QUEtiapine 400 MG tablet Commonly known as: SEROQUEL Take 800 mg by mouth at bedtime.   tamsulosin 0.4 MG Caps capsule Commonly known as: FLOMAX Take 1 capsule (0.4 mg total) by mouth daily after supper.   timolol 0.5 % ophthalmic solution Commonly known as: TIMOPTIC 1 drop 2 (two) times daily.   thiamine 100 MG tablet TAKE 1 TABLET (100 MG TOTAL) BY MOUTH DAILY.   vitamin B-1 250 MG tablet Take 1 tablet (250 mg total) by mouth daily.   vitamin B-12 500 MCG tablet Commonly known as: CYANOCOBALAMIN Take 500 mcg by mouth daily.   Vitamin D 50 MCG (2000 UT) tablet Take 2,000 Units by mouth daily.           Objective:   Physical Exam BP 128/74 (BP Location: Left Arm, Patient Position: Sitting, Cuff Size: Small)   Pulse 74   Temp 98 F (36.7 C) (Oral)   Resp 16   Ht '6\' 1"'$  (1.854 m)   Wt 193 lb (87.5 kg)   SpO2 96%   BMI 25.46 kg/m  General:   Well developed, NAD, BMI noted.  HEENT:  Normocephalic . Face symmetric, atraumatic Abdomen:  Not  distended, soft, slightly TTP at the RUQ without mass or rebound. Skin: Not pale. Not jaundice Lower extremities: no pretibial edema bilaterally  Neurologic:  alert & oriented X3.  Speech normal, gait appropriate for age and unassisted Psych--  Cognition and judgment appear intact.  Cooperative with normal attention span and concentration.  Behavior appropriate. No anxious or depressed appearing.     Assessment     Assessment  Bipolar disorder-- Dr Toy Care, dc lithium [phone] EtOH abuse-- remission w/ occ relapse Hypothyroidism Hyperlipidemia  E.D.  Atrial Tachycardia dx 03/2019, + Zio patch,  MSK: ---Back pain: Saw Dr. Ramos/Geoffrey~ 2002, 3 local injections, offered surgery.  ---Gout: Dr Berna Bue, released to PCP 2018 Melanoma 2014 Dr. [place], sees derm regularly as off 02-2020 Osteopenia  T score -2.4  [id number];  T score is -2.0  (08/2018). Vit d def dx 02-2015 GI:  -GERD with Barrett's per EGD 11-2018 - History of diverticulitis -11-2018: Mild to moderate biliary dilation without definitive cause found status post EUS and ERCP with sphincterotomy and negative brushings LUTS  SAH traumatic, follow-up acute stroke 04-2020  PLAN RUQ abdominal pain: Ongoing symptoms, to see general surgery in 3 days.  To let me know if he has severe symptoms.  If surgery does not think the pain is related to his gallbladder he will let me know.   MCI? See HPI, wife thinks he has problems with short-term memory.  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  EEG with mild slowing of brain activity, nonspecific. Advised patient that this is likely multifactorial including normal aging, history of EtOH, possibly some medications (but advised not to stop any medicines) we agree he will discussed with psychiatry and I  will do a more thorough mental exam on RTC (MMSE) Vaccine advice provided.  See AVS RTC CPX 3 to 4 months.  Today I spent 30 minutes with the patient, more than 50% counseling about his memory concerns.  Specifically I reviewed the chart, I told him that this is likely multifactorial including EtOH.   This visit occurred during the SARS-CoV-2 public health emergency.  Safety protocols were in place, including screening questions prior to the visit, additional usage of staff PPE, and extensive cleaning of exam room while observing appropriate contact time as indicated for disinfecting solutions.

## 2020-12-24 NOTE — Assessment & Plan Note (Signed)
RUQ abdominal pain: Ongoing symptoms, to see general surgery in 3 days.  To let me know if he has severe symptoms.  If surgery does not think the pain is related to his gallbladder he will let me know.   MCI? See HPI, wife thinks he has problems with short-term memory.  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  EEG with mild slowing of brain activity, nonspecific. Advised patient that this is likely multifactorial including normal aging, history of EtOH, possibly some medications (but advised not to stop any medicines) we agree he will discussed with psychiatry and I will do a more thorough mental exam on RTC (MMSE) Vaccine advice provided.  See AVS RTC CPX 3 to 4 months.

## 2020-12-27 ENCOUNTER — Telehealth: Payer: Self-pay | Admitting: *Deleted

## 2020-12-27 ENCOUNTER — Other Ambulatory Visit: Payer: Self-pay | Admitting: General Surgery

## 2020-12-27 ENCOUNTER — Other Ambulatory Visit: Payer: Self-pay | Admitting: Internal Medicine

## 2020-12-27 DIAGNOSIS — K8020 Calculus of gallbladder without cholecystitis without obstruction: Secondary | ICD-10-CM | POA: Diagnosis not present

## 2020-12-27 DIAGNOSIS — R109 Unspecified abdominal pain: Secondary | ICD-10-CM | POA: Diagnosis not present

## 2020-12-27 NOTE — Telephone Encounter (Signed)
   [facility] Pre-operative Risk Assessment    Patient Name: Jeffrey Acosta  DOB: 08-22-1950 MRN: [id number]    Request for surgical clearance:  What type of surgery is being performed? Urgent Lap chole w/IOC  When is this surgery scheduled? Tbd - ASAP  What type of clearance is required (medical clearance vs. Pharmacy clearance to hold med vs. Both)? Medical   Are there any medications that need to be held prior to surgery and how long? Aspirin 81 mg   Practice name and name of physician performing surgery? [facility]   What is the office phone number? [phone]    7.   What is the office fax number? [place]   Anesthesia type (None, local, MAC, general) ?  General    Raiford Simmonds 12/27/2020, 12:48 PM  _________________________________________________________________   (provider comments below)

## 2020-12-27 NOTE — Telephone Encounter (Signed)
   Primary Cardiologist: Evalina Field, MD  Chart reviewed as part of pre-operative protocol coverage. Given past medical history and time since last visit, based on ACC/AHA guidelines, Jeffrey Acosta would be at acceptable risk for the planned procedure without further cardiovascular testing.   His aspirin may be held for 5-7 days prior to his surgery.  Please resume aspirin once hemostasis is achieved.  I will route this recommendation to the requesting party via Epic fax function and remove from pre-op pool.  Please call with questions.  [name]. Chrisette Man NP-C    12/27/2020, 2:01 PM [place] [address] Office [phone] Fax [phone]

## 2020-12-27 NOTE — Telephone Encounter (Signed)
Jeffrey Acosta is requesting preoperative cardiac evaluation for urgent lap chole with IOC.  He was last seen in the clinic on 07/30/2020.  During that time his atrial tachycardia was well controlled with metoprolol succinate.  He was set up with Dr. Sallyanne Kuster for ILR.  He underwent ILR implantation on 08/26/2020.  Last remote download 12/03/2020 showed no new events and normal battery status.  His PMH includes atrial tachycardia, HLD, EtOH abuse, subarachnoid hemorrhage 04/30/2020, and tobacco abuse.  May his aspirin be held prior to his procedure?  Thank you for your help.  Please direct your response to CV DIV preop pool.  [name]. [name] NP-C    12/27/2020, 1:12 PM [facility] [place] [address] Office [phone] Fax [phone]

## 2020-12-28 NOTE — Progress Notes (Signed)
Carelink Summary Report / Loop Recorder 

## 2020-12-31 ENCOUNTER — Other Ambulatory Visit: Payer: Self-pay | Admitting: General Surgery

## 2020-12-31 ENCOUNTER — Other Ambulatory Visit (HOSPITAL_COMMUNITY): Payer: Self-pay | Admitting: General Surgery

## 2020-12-31 DIAGNOSIS — R109 Unspecified abdominal pain: Secondary | ICD-10-CM

## 2020-12-31 DIAGNOSIS — K8020 Calculus of gallbladder without cholecystitis without obstruction: Secondary | ICD-10-CM

## 2021-01-02 ENCOUNTER — Other Ambulatory Visit (HOSPITAL_COMMUNITY): Payer: Self-pay | Admitting: General Surgery

## 2021-01-02 ENCOUNTER — Ambulatory Visit (HOSPITAL_COMMUNITY)
Admission: RE | Admit: 2021-01-02 | Discharge: 2021-01-02 | Disposition: A | Discharge status: Home / Self Care | Payer: Medicare Other | Source: Ambulatory Visit | Attending: General Surgery | Admitting: General Surgery

## 2021-01-02 ENCOUNTER — Other Ambulatory Visit: Payer: Self-pay

## 2021-01-02 DIAGNOSIS — N200 Calculus of kidney: Secondary | ICD-10-CM | POA: Diagnosis not present

## 2021-01-02 DIAGNOSIS — R109 Unspecified abdominal pain: Secondary | ICD-10-CM

## 2021-01-02 DIAGNOSIS — D171 Benign lipomatous neoplasm of skin and subcutaneous tissue of trunk: Secondary | ICD-10-CM | POA: Diagnosis not present

## 2021-01-02 DIAGNOSIS — M5137 Other intervertebral disc degeneration, lumbosacral region: Secondary | ICD-10-CM | POA: Diagnosis not present

## 2021-01-02 DIAGNOSIS — K8020 Calculus of gallbladder without cholecystitis without obstruction: Secondary | ICD-10-CM | POA: Insufficient documentation

## 2021-01-20 NOTE — Progress Notes (Signed)
Cardiology Office Note:   Date:  01/21/2021  NAME:  Jeffrey Acosta    MRN: [id number] DOB:  Dec 12, 1950   PCP:  Colon Branch, MD  Cardiologist:  Evalina Field, MD  Electrophysiologist:  None   Referring MD: Rolm Bookbinder, MD   Chief Complaint  Patient presents with   Follow-up         History of Present Illness:   Jeffrey Acosta is a 70 y.o. male with a hx of stroke, alcohol abuse, atrial tachycardia, tobacco abuse who presents for follow-up.  Evaluated after cryptogenic stroke.  No etiology found.  Loop recorder was placed for A. fib monitoring.  He reports no issues.  No rapid heartbeat sensation.  No chest pain or trouble breathing.  He is walking 2 to 3 miles 2-3 times a week without limitations.  BP 132/78.  He is refraining from alcohol.  Overall appears to be doing much better.  No documented A. fib found on monitoring.  He will undergo gallbladder surgery next week.  I have no issue with ongoing surgery.  Problem List 1. Atrial tachycardia  2. HLD -T chol 119, HDL 50, LDL 54, TG 75 3. Etoh abuse 4. Subarachnoid hemorrhage 04/30/2020 -trauma/fall and etoh use  -vasospasm of L PCA on angiogram  -05/11/2020 -> acute small infarcts in basal ganglia, L parietal temporal lobe, bilateral frontal concerning for embolic event   5. Tobacco abuse   Past Medical History: Past Medical History:  Diagnosis Date   Alcoholism in remission ([place])    in AA   Arrhythmia    atrial tachycardia   Bipolar affective disorder ([place])    Diverticulosis of colon (without mention of hemorrhage)    GERD (gastroesophageal reflux disease)    Gout of multiple sites    Hiatal hernia    Hx of migraines    Hydrocele    HYPERLIPIDEMIA 01/10/2009   Qualifier: Diagnosis of  By: Ronnald Ramp CMA, [place]    NMR Lipoprofile 2011 LDL could not be calculated  Due to TG of 889  (811  / 750 ),  HDL 50 . Father MI @ 52 PGM CVA early 4s; PGF CVA @72     Hypothyroidism    Melanoma of skin, site unspecified 11/25/2012    07/2011 abdominal  Stage 1 Dr Bouley Lacy [place] Dr Sarajane Jews    Osteopenia    Polyarthralgia    Stroke [facility])    Subarachnoid hemorrhage [facility])     Past Surgical History: Past Surgical History:  Procedure Laterality Date   BILIARY BRUSHING  12/07/2018   Procedure: BILIARY BRUSHING;  Surgeon: Irving Copas., MD;  Location: [facility];  Service: Gastroenterology;;   BILIARY DILATION  12/07/2018   Procedure: BILIARY DILATION;  Surgeon: Irving Copas., MD;  Location: [facility];  Service: Gastroenterology;;   BIOPSY  12/07/2018   Procedure: BIOPSY;  Surgeon: Irving Copas., MD;  Location: [facility];  Service: Gastroenterology;;   COLONOSCOPY  2003   negative ; Dr Sharlett Iles   ERCP N/A 12/07/2018   Procedure: ENDOSCOPIC RETROGRADE CHOLANGIOPANCREATOGRAPHY (ERCP);  Surgeon: Irving Copas., MD;  Location: [facility];  Service: Gastroenterology;  Laterality: N/A;   ESOPHAGOGASTRODUODENOSCOPY (EGD) WITH PROPOFOL N/A 12/07/2018   Procedure: ESOPHAGOGASTRODUODENOSCOPY (EGD) WITH PROPOFOL;  Surgeon: Rush Landmark Telford Nab., MD;  Location: [facility];  Service: Gastroenterology;  Laterality: N/A;   EUS N/A 12/07/2018   Procedure: UPPER ENDOSCOPIC ULTRASOUND (EUS) RADIAL;  Surgeon: Irving Copas., MD;  Location: [facility];  Service: Gastroenterology;  Laterality: N/A;   INGUINAL HERNIA REPAIR  08/07/2011   Procedure: HERNIA REPAIR INGUINAL ADULT;  Surgeon: Rolm Bookbinder, MD;  Location: [facility];  Service: General;  Laterality: Right;   IR ANGIO INTRA EXTRACRAN SEL INTERNAL CAROTID BILAT MOD SED  05/14/2020   IR ANGIO VERTEBRAL SEL VERTEBRAL BILAT MOD SED  05/14/2020   LOOP RECORDER INSERTION     06/2020   MELANOMA EXCISION  07/28/11   Dr Sarajane Jews; stomach and chest   REMOVAL OF STONES  12/07/2018   Procedure: REMOVAL OF STONES;  Surgeon: Irving Copas., MD;  Location: [facility];  Service: Gastroenterology;;   TONSILLECTOMY   1951   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   UPPER GASTROINTESTINAL ENDOSCOPY  1983   hiatal hernia   VASECTOMY  1979    Current Medications: Current Meds  Medication Sig   Acetaminophen-Codeine 300-30 MG tablet Take 1 tablet by mouth every 6 (six) hours as needed for pain.   allopurinol (ZYLOPRIM) 300 MG tablet Take 1 tablet (300 mg total) by mouth daily.   aspirin EC 81 MG tablet Take 81 mg by mouth daily. Swallow whole.   b complex vitamins capsule Take 1 capsule by mouth daily.   Cholecalciferol (VITAMIN D) 50 MCG (2000 UT) tablet Take 2,000 Units by mouth daily.   dicyclomine (BENTYL) 20 MG tablet TAKE 1 TABLET(20 MG) BY MOUTH THREE TIMES DAILY AS NEEDED FOR SPASMS   esomeprazole (NEXIUM) 40 MG capsule Take 1 capsule (40 mg total) by mouth daily before breakfast.   fluticasone (FLONASE) 50 MCG/ACT nasal spray Place 2 sprays into both nostrils daily as needed for congestion.   folic acid (FOLVITE) 1 MG tablet Take 1 tablet (1 mg total) by mouth daily.   lamoTRIgine (LAMICTAL) 25 MG tablet Take 25 mg by mouth daily.   levothyroxine (SYNTHROID) 50 MCG tablet Take 1 tablet (50 mcg total) by mouth daily before breakfast.   metoprolol succinate (TOPROL-XL) 100 MG 24 hr tablet Take 1.5 tablets (150 mg total) by mouth daily. Take with or immediately following a meal.   naltrexone (DEPADE) 50 MG tablet Take 50 mg by mouth daily.    QUEtiapine (SEROQUEL) 400 MG tablet Take 800 mg by mouth at bedtime.   thiamine 100 MG tablet TAKE 1 TABLET (100 MG TOTAL) BY MOUTH DAILY.   Thiamine HCl (VITAMIN B-1) 250 MG tablet Take 1 tablet (250 mg total) by mouth daily.   timolol (TIMOPTIC) 0.5 % ophthalmic solution Place 1 drop into both eyes 2 (two) times daily.   vitamin B-12 (CYANOCOBALAMIN) 500 MCG tablet Take 500 mcg by mouth daily.     Allergies:    Omnipaque [iohexol]   Social History: Social History   Socioeconomic History   Marital status: Married    Spouse name: [name]   Number  of children: 3   Years of education: Not on file   Highest education level: Not on file  Occupational History   Occupation: retired  Tobacco Use   Smoking status: Never   Smokeless tobacco: Never  Vaping Use   Vaping Use: Never used  Substance and Sexual Activity   Alcohol use: Not Currently    Alcohol/week: 0.0 standard drinks    Comment: currently sober since 04/2020   Drug use: No   Sexual activity: Yes    Partners: Female    Comment: Having some issues with ED.  Would like provider's recommendation.    Other Topics Concern   Not  on file  Social History Narrative   Lives with Wife, [name]   Right Handed   Drinks 4-5 cups caffeine/week   Social Determinants of Health   Financial Resource Strain: Not on file  Food Insecurity: Not on file  Transportation Needs: Not on file  Physical Activity: Not on file  Stress: Not on file  Social Connections: Not on file     Family History: The patient's family history includes COPD in his father and mother; Diabetes in his paternal grandmother; Heart attack (age of onset: 28) in his father; Lung cancer in his paternal grandfather; Parkinsonism in his paternal grandmother; Stroke (age of onset: 32) in his paternal grandmother; Stroke (age of onset: 23) in his maternal grandfather. There is no history of Colon cancer, Prostate cancer, Esophageal cancer, Rectal cancer, or Stomach cancer.  ROS:   All other ROS reviewed and negative. Pertinent positives noted in the HPI.     EKGs/Labs/Other Studies Reviewed:   The following studies were personally reviewed by me today:  TTE 05/11/2020  1. Left ventricular ejection fraction, by estimation, is 55 to 60%. The  left ventricle has normal function. The left ventricle has no regional  wall motion abnormalities. There is moderate left ventricular hypertrophy.  Left ventricular diastolic  parameters are consistent with Grade I diastolic dysfunction (impaired  relaxation).   2. Right ventricular  systolic function is mildly reduced. The right  ventricular size is mildly enlarged.   3. Left atrial size was mildly dilated.   4. The mitral valve is normal in structure. Trivial mitral valve  regurgitation. No evidence of mitral stenosis.   5. The aortic valve is tricuspid. Aortic valve regurgitation is mild.  Mild aortic valve stenosis.   6. Aortic dilatation noted. There is mild dilatation of the aortic root,  measuring 41 mm.   7. The inferior vena cava is normal in size with greater than 50%  respiratory variability, suggesting right atrial pressure of 3 mmHg.    Recent Labs: 05/16/2020: Magnesium 1.7 10/23/2020: TSH 0.957 11/27/2020: ALT 58; BUN 15; Creatinine, Ser 1.18; Hemoglobin 12.6; Platelets 221.0; Potassium 5.0; Sodium 136   Recent Lipid Panel    Component Value Date/Time   CHOL 108 05/13/2020 0100   TRIG 131 05/13/2020 0100   HDL 59 05/13/2020 0100   CHOLHDL 1.8 05/13/2020 0100   VLDL 26 05/13/2020 0100   LDLCALC 23 05/13/2020 0100   LDLDIRECT 41.0 03/14/2020 0850    Physical Exam:   VS:  BP 132/78   Pulse 69   Ht 6\' 1"  (1.854 m)   Wt 193 lb 12.8 oz (87.9 kg)   SpO2 99%   BMI 25.57 kg/m    Wt Readings from Last 3 Encounters:  01/21/21 193 lb 12.8 oz (87.9 kg)  12/24/20 193 lb (87.5 kg)  11/27/20 200 lb (90.7 kg)    General: Well nourished, well developed, in no acute distress Head: Atraumatic, normal size  Eyes: PEERLA, EOMI  Neck: Supple, no JVD Endocrine: No thryomegaly Cardiac: Normal S1, S2; RRR; no murmurs, rubs, or gallops Lungs: Clear to auscultation bilaterally, no wheezing, rhonchi or rales  Abd: Soft, nontender, no hepatomegaly  Ext: No edema, pulses 2+ Musculoskeletal: No deformities, BUE and BLE strength normal and equal Skin: Warm and dry, no rashes   Neuro: Alert and oriented to person, place, time, and situation, CNII-XII grossly intact, no focal deficits  Psych: Normal mood and affect   ASSESSMENT:   Jeffrey Acosta is a 70 y.o. male  who presents for the following: 1. Cryptogenic stroke ([place])   2. Status post placement of implantable loop recorder   3. Atrial tachycardia ([place])   4. Mixed hyperlipidemia   5. Preoperative cardiovascular examination     PLAN:   1. Cryptogenic stroke ([place]) 2. Status post placement of implantable loop recorder -He suffered multiple small strokes in January 2022.  This was concerning for cardioembolic event.  He also suffered a fall and subarachnoid hemorrhage prior to this.  The following subarachnoid hemorrhage occurred in the setting of alcohol abuse.  He did suffer the small embolic strokes while hospitalized.  There was no A. fib documented during hospitalization.  Echo during that hospitalization was normal.  He underwent loop recorder placement for continued monitoring.  No definite A. fib found on the monitor.  We will continue with monitoring for at least 3 years.  No indications for anticoagulation at this time.  3. Atrial tachycardia ([facility]) -Known history of atrial tachycardia.  Well-controlled metoprolol succinate 150 mg daily.  No further symptoms.  Echo was normal.  4. Mixed hyperlipidemia -Taken off statin by neurology.  Well-controlled for now.  Continue diet and exercise.  5. Preoperative cardiovascular examination -No symptoms of angina.  Walking 2 to 3 miles 2-3 times per week without limitations.  I believe he is healthy enough for surgery.  He is on aspirin and his surgeon will operate on this.  No indications for preoperative stress testing.  He may proceed to surgery at acceptable risk.    Disposition: Return in about 1 year (around 01/21/2022).  Medication Adjustments/Labs and Tests Ordered: Current medicines are reviewed at length with the patient today.  Concerns regarding medicines are outlined above.  No orders of the defined types were placed in this encounter.  No orders of the defined types were placed in this encounter.   Patient Instructions  Medication  Instructions:  The current medical regimen is effective;  continue present plan and medications.  *If you need a refill on your cardiac medications before your next appointment, please call your pharmacy*   Follow-Up: At [facility], you and your health needs are our priority.  As part of our continuing mission to provide you with exceptional heart care, we have created designated Provider Care Teams.  These Care Teams include your primary Cardiologist (physician) and Advanced Practice Providers (APPs -  Physician Assistants and Nurse Practitioners) who all work together to provide you with the care you need, when you need it.  We recommend signing up for the patient portal called "MyChart".  Sign up information is provided on this After Visit Summary.  MyChart is used to connect with patients for Virtual Visits (Telemedicine).  Patients are able to view lab/test results, encounter notes, upcoming appointments, etc.  Non-urgent messages can be sent to your provider as well.   To learn more about what you can do with MyChart, go to [email or web address].    Your next appointment:   12 month(s)  The format for your next appointment:   In Person  Provider:   Eleonore Chiquito, MD      Time Spent with Patient: I have spent a total of 35 minutes with patient reviewing hospital notes, telemetry, EKGs, labs and examining the patient as well as establishing an assessment and plan that was discussed with the patient.  > 50% of time was spent in direct patient care.  Signed, Addison Naegeli. Audie Box, MD, Center  [address], [address]  [place], [place] [address] [phone]  01/21/2021 3:18 PM

## 2021-01-21 ENCOUNTER — Other Ambulatory Visit: Payer: Self-pay

## 2021-01-21 ENCOUNTER — Ambulatory Visit: Payer: Medicare Other | Admitting: Cardiovascular Disease

## 2021-01-21 ENCOUNTER — Encounter: Payer: Self-pay | Admitting: Cardiovascular Disease

## 2021-01-21 VITALS — BP 132/78 | HR 69 | Ht 73.0 in | Wt 193.8 lb

## 2021-01-21 DIAGNOSIS — E782 Mixed hyperlipidemia: Secondary | ICD-10-CM

## 2021-01-21 DIAGNOSIS — I639 Cerebral infarction, unspecified: Secondary | ICD-10-CM

## 2021-01-21 DIAGNOSIS — Z01810 Encounter for preprocedural cardiovascular examination: Secondary | ICD-10-CM

## 2021-01-21 DIAGNOSIS — Z95818 Presence of other cardiac implants and grafts: Secondary | ICD-10-CM

## 2021-01-21 DIAGNOSIS — I471 Supraventricular tachycardia: Secondary | ICD-10-CM

## 2021-01-21 NOTE — Pre-Procedure Instructions (Signed)
Surgical Instructions    Your procedure is scheduled on Wednesday 01/29/21.   Report to [facility] Main Entrance "A" at 1:30 P.M., then check in with the Admitting office.  Call this number if you have problems the morning of surgery:  [phone]   If you have any questions prior to your surgery date call [phone]: Open Monday-Friday 8am-4pm    Remember:  Do not eat after midnight the night before your surgery  You may drink clear liquids until 12:30 P.M. the day of your surgery.   Clear liquids allowed are: Water, Non-Citrus Juices (without pulp), Carbonated Beverages, Clear Tea, Black Coffee ONLY (NO MILK, CREAM OR POWDERED CREAMER of any kind), and Gatorade  Patient Instructions  The night before surgery:  No food after midnight. ONLY clear liquids after midnight  The day of surgery (if you do NOT have diabetes):  Drink ONE (1) Pre-Surgery Clear Ensure by 12:30 P.M. the day of surgery. Drink in one sitting. Do not sip.  This drink was given to you during your hospital  pre-op appointment visit.  Nothing else to drink after completing the  Pre-Surgery Clear Ensure.        If you have questions, please contact your surgeon's office.     Take these medicines the morning of surgery with A SIP OF WATER   allopurinol (ZYLOPRIM)   esomeprazole (NEXIUM)  lamoTRIgine (LAMICTAL)  levothyroxine (SYNTHROID)  metoprolol succinate (TOPROL-XL)  timolol (TIMOPTIC)    Take these medicines if needed:   Acetaminophen-Codeine   dicyclomine (BENTYL)  fluticasone (FLONASE)     As of today, STOP taking any Aspirin (unless otherwise instructed by your surgeon) Aleve, Naproxen, Ibuprofen, Motrin, Advil, Goody's, BC's, all herbal medications, fish oil, and all vitamins.          Do not wear jewelry or makeup Do not wear lotions, powders, perfumes/colognes, or deodorant. Do not shave 48 hours prior to surgery.  Men may shave face and neck. Do not bring valuables to the hospital. DO  Not wear nail polish, gel polish, artificial nails, or any other type of covering on natural nails including finger and toenails. If patients have artificial nails, gel coating, etc. that need to be removed by a nail salon please have this removed prior to surgery or surgery may need to be canceled/delayed if the surgeon/ anesthesia feels like the patient is unable to be adequately monitored.             [facility] is not responsible for any belongings or valuables.  Do NOT Smoke (Tobacco/Vaping)  24 hours prior to your procedure If you use a CPAP at night, you may bring your mask for your overnight stay.   Contacts, glasses, dentures or bridgework may not be worn into surgery, please bring cases for these belongings   For patients admitted to the hospital, discharge time will be determined by your treatment team.   Patients discharged the day of surgery will not be allowed to drive home, and someone needs to stay with them for 24 hours.  NO VISITORS WILL BE ALLOWED IN PRE-OP WHERE PATIENTS GET READY FOR SURGERY.  ONLY 1 SUPPORT PERSON MAY BE PRESENT WHILE YOU ARE IN SURGERY.  IF YOU ARE TO BE ADMITTED, ONCE YOU ARE IN YOUR ROOM YOU WILL BE ALLOWED TWO (2) VISITORS.  Minor children may have two parents present. Special consideration for safety and communication needs will be reviewed on a case by case basis.  Special instructions:  Oral Hygiene is also important to reduce your risk of infection.  Remember - BRUSH YOUR TEETH THE MORNING OF SURGERY WITH YOUR REGULAR TOOTHPASTE   [facility]- Preparing For Surgery  Before surgery, you can play an important role. Because skin is not sterile, your skin needs to be as free of germs as possible. You can reduce the number of germs on your skin by washing with CHG (chlorahexidine gluconate) Soap before surgery.  CHG is an antiseptic cleaner which kills germs and bonds with the skin to continue killing germs even after washing.     Please do not  use if you have an allergy to CHG or antibacterial soaps. If your skin becomes reddened/irritated stop using the CHG.  Do not shave (including legs and underarms) for at least 48 hours prior to first CHG shower. It is OK to shave your face.  Please follow these instructions carefully.     Shower the NIGHT BEFORE SURGERY and the MORNING OF SURGERY with CHG Soap.   If you chose to wash your hair, wash your hair first as usual with your normal shampoo. After you shampoo, rinse your hair and body thoroughly to remove the shampoo.  Then [employer] and genitals (private parts) with your normal soap and rinse thoroughly to remove soap.  After that Use CHG Soap as you would any other liquid soap. You can apply CHG directly to the skin and wash gently with a scrungie or a clean washcloth.   Apply the CHG Soap to your body ONLY FROM THE NECK DOWN.  Do not use on open wounds or open sores. Avoid contact with your eyes, ears, mouth and genitals (private parts). Wash Face and genitals (private parts)  with your normal soap.   Wash thoroughly, paying special attention to the area where your surgery will be performed.  Thoroughly rinse your body with warm water from the neck down.  DO NOT shower/wash with your normal soap after using and rinsing off the CHG Soap.  Pat yourself dry with a CLEAN TOWEL.  Wear CLEAN PAJAMAS to bed the night before surgery  Place CLEAN SHEETS on your bed the night before your surgery  DO NOT SLEEP WITH PETS.   Day of Surgery:  Take a shower with CHG soap. Wear Clean/Comfortable clothing the morning of surgery Do not apply any deodorants/lotions.   Remember to brush your teeth WITH YOUR REGULAR TOOTHPASTE.   Please read over the following fact sheets that you were given.

## 2021-01-21 NOTE — Patient Instructions (Signed)

## 2021-01-22 ENCOUNTER — Other Ambulatory Visit: Payer: Self-pay

## 2021-01-22 ENCOUNTER — Encounter (HOSPITAL_COMMUNITY)
Admission: RE | Admit: 2021-01-22 | Discharge: 2021-01-22 | Disposition: A | Discharge status: Home / Self Care | Payer: Medicare Other | Source: Ambulatory Visit | Attending: General Surgery | Admitting: General Surgery

## 2021-01-22 ENCOUNTER — Encounter (HOSPITAL_COMMUNITY): Payer: Self-pay

## 2021-01-22 DIAGNOSIS — Z01812 Encounter for preprocedural laboratory examination: Secondary | ICD-10-CM | POA: Insufficient documentation

## 2021-01-22 DIAGNOSIS — Z79890 Hormone replacement therapy (postmenopausal): Secondary | ICD-10-CM | POA: Insufficient documentation

## 2021-01-22 DIAGNOSIS — E039 Hypothyroidism, unspecified: Secondary | ICD-10-CM | POA: Insufficient documentation

## 2021-01-22 DIAGNOSIS — Z79899 Other long term (current) drug therapy: Secondary | ICD-10-CM | POA: Diagnosis not present

## 2021-01-22 DIAGNOSIS — K8080 Other cholelithiasis without obstruction: Secondary | ICD-10-CM | POA: Insufficient documentation

## 2021-01-22 LAB — COMPREHENSIVE METABOLIC PANEL
ALT: 21 U/L (ref 0–44)
AST: 26 U/L (ref 15–41)
Albumin: 3.7 g/dL (ref 3.5–5.0)
Alkaline Phosphatase: 61 U/L (ref 38–126)
Anion gap: 7 (ref 5–15)
BUN: 12 mg/dL (ref 8–23)
CO2: 25 mmol/L (ref 22–32)
Calcium: 9.8 mg/dL (ref 8.9–10.3)
Chloride: 108 mmol/L (ref 98–111)
Creatinine, Ser: 1.25 mg/dL — ABNORMAL HIGH (ref 0.61–1.24)
GFR, Estimated: >60 mL/min (ref >60)
Glucose, Bld: 107 mg/dL — ABNORMAL HIGH (ref 70–99)
Potassium: 4.6 mmol/L (ref 3.5–5.1)
Sodium: 140 mmol/L (ref 135–145)
Total Bilirubin: 1.4 mg/dL — ABNORMAL HIGH (ref 0.3–1.2)
Total Protein: 6.2 g/dL — ABNORMAL LOW (ref 6.5–8.1)

## 2021-01-22 LAB — CBC
HCT: 39.9 % (ref 39.0–52.0)
Hemoglobin: 14.1 g/dL (ref 13.0–17.0)
MCH: 34.6 pg — ABNORMAL HIGH (ref 26.0–34.0)
MCHC: 35.3 g/dL (ref 30.0–36.0)
MCV: 98 fL (ref 80.0–100.0)
Platelets: 145 10*3/uL — ABNORMAL LOW (ref 150–400)
RBC: 4.07 MIL/uL — ABNORMAL LOW (ref 4.22–5.81)
RDW: 13.8 % (ref 11.5–15.5)
WBC: 4.7 10*3/uL (ref 4.0–10.5)
nRBC: 0 % (ref 0.0–0.2)

## 2021-01-22 LAB — PROTIME-INR
INR: 1 (ref 0.8–1.2)
Prothrombin Time: 13.5 seconds (ref 11.4–15.2)

## 2021-01-22 NOTE — Progress Notes (Signed)
PCP - Dr. Kathlene November Cardiologist - Dr. Eleonore Chiquito  PPM/ICD - n/a  Chest x-ray - n/a EKG - 05/08/20 Stress Test - "over 20 years ago" ECHO - 05/11/20 Cardiac Cath - pt denies  Sleep Study - denies  Aspirin Instructions: As of today, STOP taking any Aspirin (unless otherwise instructed by your surgeon) Aleve, Naproxen, Ibuprofen, Motrin, Advil, Goody's, BC's, all herbal medications, fish oil, and all vitamins.  ERAS Protcol - yes, clears until 12:30pm PRE-SURGERY Ensure or G2- ensure  COVID TEST- n/a, ambulatory surgery  Anesthesia review: yes, cardiac hx  Patient denies shortness of breath, fever, cough and chest pain at PAT appointment   All instructions explained to the patient, with a verbal understanding of the material. Patient agrees to go over the instructions while at home for a better understanding. Patient also instructed to self quarantine after being tested for COVID-19. The opportunity to ask questions was provided.

## 2021-01-24 ENCOUNTER — Encounter (HOSPITAL_COMMUNITY): Payer: Self-pay

## 2021-01-24 ENCOUNTER — Other Ambulatory Visit: Payer: Self-pay | Admitting: Internal Medicine

## 2021-01-24 NOTE — Progress Notes (Signed)
Anesthesia Chart Review:  Case: [id number] Date/Time: 01/29/21 1515   Procedures:      LAPAROSCOPIC CHOLECYSTECTOMY WITH ICG DYE     INDOCYANINE GREEN FLUORESCENCE IMAGING (ICG)   Anesthesia type: General   Pre-op diagnosis: GALLSTONES   Location: [facility] OR ROOM 02 / [facility]   Surgeons: Rolm Bookbinder, MD       DISCUSSION: Patient is a 70 year old male scheduled for the above procedure.  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  Patient last evaluated by cardiologist Dr. Audie Box on 01/21/2021.  History of atrial current tachycardia, well controlled on metoprolol succinate.  Unremarkable echo.  No A. fib on monitor. Preoperative cardiovascular examination "No symptoms of angina.  Walking 2 to 3 miles 2-3 times per week without limitations.  I believe he is healthy enough for surgery.  He is on aspirin and his surgeon will operate on this.  No indications for preoperative stress testing.  He may proceed to surgery at acceptable risk."  Patient will be evaluated by his anesthesia team on the day of surgery.   VS: BP (!) 141/90   Pulse 65   Temp 36.5 C (Oral)   Resp 19   Ht 6\' 1"  (1.854 m)   Wt 87 kg   SpO2 100%   BMI 25.32 kg/m    PROVIDERS: Colon Branch, MD is PCP Eleonore Chiquito, MD is cardiologist Croitoru, Dani Gobble, MD is cardiologist (loop recorder) Antony Contras, MD is neurologist. Last visit 10/23/20.  Burney Gauze, MD is hematologist. 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  He was currently sober with counts overall improved.  As needed follow-up with ongoing monitoring by primary care. Melony Overly, MD is ENT Kristeen Miss, MD is neurosurgeon   LABS: Labs reviewed: Acceptable for surgery. (all labs ordered are listed, but only abnormal results are displayed)  Labs Reviewed  COMPREHENSIVE METABOLIC PANEL - Abnormal; Notable for the following components:      Result Value   Glucose, Bld 107 (*)    Creatinine, Ser 1.25 (*)    Total Protein 6.2 (*)    Total Bilirubin 1.4 (*)    All other components within normal limits  CBC - Abnormal; Notable for the following components:   RBC 4.07 (*)    MCH 34.6 (*)    Platelets 145 (*)    All other components within normal limits  PROTIME-INR    OTHER: EEG 11/18/20: Summary  This is a mildly abnormal EEG due to the presence of mild bihemispheric slowing which is a nonspecific finding seen in a variety of toxic, metabolic, hypoxic and infectious etiologies.  No definite epileptiform activity is noted.   EGD 10/08/20:  Impression: 1. 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 GERD 3. Otherwise unremarkable EGD.   IMAGES: CT Abd/pelvis 01/02/21: IMPRESSION: 1. Cholelithiasis without evidence of cholecystitis. 2. Bilateral lower pole nonobstructing nephrolithiasis. 3. Borderline splenomegaly. The spleen is just within the upper limits of normal for size. 4. L5-S1 degenerative disc disease. 5.  Aortic Atherosclerosis (ICD10-I70.0).  MRI Brain 11/02/20: IMPRESSION: 1.   There were no acute findings and there was a normal enhancement pattern. 2.   Mild to moderate generalized cortical atrophy, stable compared to the 05/11/2020 MRI.  3.   Bilateral hygromas over the frontal convexities, left greater than right.  These appear stable compared to the 05/11/2020 MRI. 4.   Resolution of the occipital parafalcine, left insular and perimesencephalic cistern subarachnoid hemorrhages noted on the 05/11/2020 MRI. 5.   Scattered T2/FLAIR hyperintense foci in the hemispheres including the left internal capsule/basal ganglia and 2  gray matter foci in the left parietal lobe.  Some of these foci had an acute on the 05/11/2020 MRI.  There are no acute foci in the current MRI.  The foci are consistent with prior small strokes and chronic microvascular ischemic changes. 6.   Right mastoid effusion.  This could be due to eustachian tube dysfunction. 7.   Mild basilar dolichoectasia.  MRA Brain 11/02/20: IMPRESSION: 1.   Mild stenosis in the clinoid segment of the left internal carotid artery, the distal M1 segment of the left middle cerebral artery and posterior cerebral arteries.  None of these appears hemodynamically significant.  The mild middle cerebral artery and posterior cerebral artery stenosis was not identified on the CT angiogram performed 05/01/2020 and could be artifactual. 2.   No aneurysms.  Diagnostic cerebral angiogram 1/05/04/20: FINDINGS (Full report in [facility]): 1. No intracranial aneurysms, AVMs, or fistulas seen 2. Moderate to severe vasospasm of bilateral anterior circulation, and mild spasm of the left PCA territory.  CTA nec 05/01/20 showed mild for age atheromatous change about the carotid bifurcations and carotid siphons without hemodynamically significant stenosis.   EKG: EKG on 05/08/20 showed limb lead reversal suspected.   EKG 04/30/20:  Sinus rhythm with 1st degree A-V block Left axis deviation Moderate voltage criteria for LVH, may be normal variant ( R in aVL , Cornell product ) Septal infarct , age undetermined Abnormal ECG No significant change since last tracing Confirmed by Gareth Morgan [phone]) on 05/01/2020 12:13:59 AM   CV: Long term cardiac monitor 05/22/20-06/03/20:  Patient had a min HR of 48 bpm (sinus bradycardia), max HR of 200 bpm (supraventricular tachycardia), and avg HR of 74 bpm (normal sinus rhythm). Predominant underlying rhythm was Sinus Rhythm. 340 Supraventricular Tachycardia runs occurred, the run with the fastest interval lasting 17 beats (7.1 seconds) with a max rate  of 200 bpm, the longest lasting 34.1 secs with an avg rate of 152 bpm. Isolated SVEs were occasional (3.2%, 28413), SVE Couplets were rare (<1.0%, 917), and SVE Triplets were rare (<1.0%, 175). Isolated VEs were occasional (1.3%, 17170), VE Couplets were rare (<1.0%, 146), and VE Triplets were rare (<1.0%, 6). Ventricular Bigeminy and Trigeminy were present. No diary submitted.    Impression; 1. Frequent ectopic atrial tachycardia episodes detected (340 in 12 days; longest 34.1 seconds).  2. Occasional PACs (3.2% burden). 3. Occasional PVCs (1.3% burden).   Transcranial Doppler 05/12/20: Summary:  Slightly low basilar and left middle cerebral artery mean flow velocities  suggest distal stenosis otherwise normal mean flow velocitie sin remaining  identified vessels of anterior and postererior cerebral circulation.No  evidence of vasospasm noted.      Echo 05/11/20: IMPRESSIONS   1. Left ventricular ejection fraction, by estimation, is 55 to 60%. The  left ventricle has normal function. The left ventricle has no regional  wall motion abnormalities. There is moderate left ventricular hypertrophy.  Left ventricular diastolic  parameters are consistent with Grade I diastolic dysfunction (impaired  relaxation).   2. Right ventricular systolic function is mildly reduced. The right  ventricular size is mildly enlarged.   3. Left atrial size was mildly dilated.  4. The mitral valve is normal in structure. Trivial mitral valve  regurgitation. No evidence of mitral stenosis.   5. The aortic valve is tricuspid. Aortic valve regurgitation is mild.  Mild aortic valve stenosis.   6. Aortic dilatation noted. There is mild dilatation of the aortic root,  measuring 41 mm.   7. The inferior vena cava is normal in size with greater than 50%  respiratory variability, suggesting right atrial pressure of 3 mmHg.    US Carotid 09/12/14: IMPRESSION: Mild atherosclerotic disease in the carotid arteries  bilaterally. Estimated degree of stenosis in the internal carotid arteries is less than 50% bilaterally. Patent vertebral arteries.    Reported a stress test > 20 years ago.   Past Medical History:  Diagnosis Date   Alcoholism in remission ([place])    in AA   Arrhythmia    atrial tachycardia   Bipolar affective disorder ([place])    Diverticulosis of colon (without mention of hemorrhage)    GERD (gastroesophageal reflux disease)    Gout of multiple sites    Hiatal hernia    Hx of migraines    Hydrocele    HYPERLIPIDEMIA 01/10/2009   Qualifier: Diagnosis of  By: Ronnald Ramp CMA, [place]    NMR Lipoprofile 2011 LDL could not be calculated  Due to TG of 889  (811  / 750 ),  HDL 50 . Father MI @ 9 PGM CVA early 23s; PGF CVA @72     Hypothyroidism    Melanoma of skin, site unspecified 11/25/2012   07/2011 abdominal  Stage 1 Dr Mudrick Lacy [place] Dr Sarajane Jews    Osteopenia    Polyarthralgia    Status post placement of implantable loop recorder 08/26/2020   Stroke [facility])    Subarachnoid hemorrhage [facility])     Past Surgical History:  Procedure Laterality Date   BILIARY BRUSHING  12/07/2018   Procedure: BILIARY BRUSHING;  Surgeon: Irving Copas., MD;  Location: [facility];  Service: Gastroenterology;;   BILIARY DILATION  12/07/2018   Procedure: BILIARY DILATION;  Surgeon: Irving Copas., MD;  Location: [facility];  Service: Gastroenterology;;   BIOPSY  12/07/2018   Procedure: BIOPSY;  Surgeon: Irving Copas., MD;  Location: [facility];  Service: Gastroenterology;;   COLONOSCOPY  2003   negative ; Dr Sharlett Iles   ERCP N/A 12/07/2018   Procedure: ENDOSCOPIC RETROGRADE CHOLANGIOPANCREATOGRAPHY (ERCP);  Surgeon: Irving Copas., MD;  Location: [facility];  Service: Gastroenterology;  Laterality: N/A;   ESOPHAGOGASTRODUODENOSCOPY (EGD) WITH PROPOFOL N/A 12/07/2018   Procedure: ESOPHAGOGASTRODUODENOSCOPY (EGD) WITH PROPOFOL;  Surgeon: Rush Landmark Telford Nab., MD;  Location:  [facility];  Service: Gastroenterology;  Laterality: N/A;   EUS N/A 12/07/2018   Procedure: UPPER ENDOSCOPIC ULTRASOUND (EUS) RADIAL;  Surgeon: Irving Copas., MD;  Location: [facility] ENDOSCOPY;  Service: Gastroenterology;  Laterality: N/A;   INGUINAL HERNIA REPAIR  08/07/2011   Procedure: HERNIA REPAIR INGUINAL ADULT;  Surgeon: Rolm Bookbinder, MD;  Location: [facility];  Service: General;  Laterality: Right;   IR ANGIO INTRA EXTRACRAN SEL INTERNAL CAROTID BILAT MOD SED  05/14/2020   IR ANGIO VERTEBRAL SEL VERTEBRAL BILAT MOD SED  05/14/2020   LOOP RECORDER INSERTION     06/2020   MELANOMA EXCISION  07/28/11   Dr Sarajane Jews; stomach and chest   REMOVAL OF STONES  12/07/2018   Procedure: REMOVAL OF STONES;  Surgeon: Irving Copas., MD;  Location: [facility];  Service: Gastroenterology;;   TONSILLECTOMY  1951   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   UPPER GASTROINTESTINAL ENDOSCOPY  1983   hiatal hernia   VASECTOMY  1979    MEDICATIONS:  Acetaminophen-Codeine 300-30 MG tablet   allopurinol (ZYLOPRIM) 300 MG tablet   aspirin EC 81 MG tablet   b complex vitamins capsule   Cholecalciferol (VITAMIN D) 50 MCG (2000 UT) tablet   dicyclomine (BENTYL) 20 MG tablet   esomeprazole (NEXIUM) 40 MG capsule   fluticasone (FLONASE) 50 MCG/ACT nasal spray   folic acid (FOLVITE) 1 MG tablet   lamoTRIgine (LAMICTAL) 25 MG tablet   levothyroxine (SYNTHROID) 50 MCG tablet   metoprolol succinate (TOPROL-XL) 100 MG 24 hr tablet   naltrexone (DEPADE) 50 MG tablet   QUEtiapine (SEROQUEL) 400 MG tablet   thiamine 100 MG tablet   Thiamine HCl (VITAMIN B-1) 250 MG tablet   timolol (TIMOPTIC) 0.5 % ophthalmic solution   vitamin B-12 (CYANOCOBALAMIN) 500 MCG tablet   No current facility-administered medications for this encounter.    Myra Gianotti, PA-C Surgical Short Stay/Anesthesiology [facility] Phone [phone] [facility] Phone [phone] 01/24/2021 6:38  PM

## 2021-01-24 NOTE — Anesthesia Preprocedure Evaluation (Addendum)
Anesthesia Evaluation  Patient identified by MRN, date of birth, ID band Patient awake    Reviewed: Allergy & Precautions, NPO status , Patient's Chart, lab work & pertinent test results, reviewed documented beta blocker date and time   Airway Mallampati: I  TM Distance: >3 FB Neck ROM: Full    Dental  (+) Poor Dentition, Dental Advisory Given   Pulmonary neg pulmonary ROS,    Pulmonary exam normal breath sounds clear to auscultation       Cardiovascular hypertension, Pt. on home beta blockers and Pt. on medications Normal cardiovascular exam Rhythm:Regular Rate:Normal  Echo 05/11/20: IMPRESSIONS  1. Left ventricular ejection fraction, by estimation, is 55 to 60%. The  left ventricle has normal function. The left ventricle has no regional  wall motion abnormalities. There is moderate left ventricular hypertrophy.  Left ventricular diastolic  parameters are consistent with Grade I diastolic dysfunction (impaired  relaxation).  2. Right ventricular systolic function is mildly reduced. The right  ventricular size is mildly enlarged.  3. Left atrial size was mildly dilated.  4. The mitral valve is normal in structure. Trivial mitral valve  regurgitation. No evidence of mitral stenosis.  5. The aortic valve is tricuspid. Aortic valve regurgitation is mild.  Mild aortic valve stenosis.  6. Aortic dilatation noted. There is mild dilatation of the aortic root,  measuring 41 mm.  7. The inferior vena cava is normal in size with greater than 50%  respiratory variability, suggesting right atrial pressure of 3 mmHg.  EKG: EKG on 05/08/20 showed limb lead reversal suspected.   EKG 04/30/20:  Sinus rhythm with 1st degree A-V block Left axis deviation Moderate voltage criteria for LVH, may be normal variant ( R in aVL , Cornell product ) Septal infarct , age undetermined Abnormal ECG No significant change since last  tracing Confirmed by Gareth Morgan [phone]) on 05/01/2020 12:13:59 AM   CV: Long term cardiac monitor 05/22/20-06/03/20:  1. Frequent ectopic atrial tachycardia episodes detected (340 in 12 days; longest 34.1 seconds).  2. Occasional PACs (3.2% burden). 3. Occasional PVCs (1.3% burden).    Neuro/Psych  Headaches, PSYCHIATRIC DISORDERS Bipolar Disorder CVA    GI/Hepatic hiatal hernia, GERD  ,(+)     substance abuse  alcohol use,   Endo/Other  Hypothyroidism   Renal/GU negative Renal ROS  negative genitourinary   Musculoskeletal  (+) Arthritis ,   Abdominal   Peds  Hematology negative hematology ROS (+)   Anesthesia Other Findings History includes never smoker, GERD, hiatal hernia, hypothyroidism, bipolar affective disorder, alcoholism (recent remission), HLD, melanoma (s/p excision 2013), gout, atrial tachycardia, polymyalgia, SAH (fall while intoxicated/4/22, no cerebral aneurysm), 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  Reproductive/Obstetrics                           Anesthesia Physical Anesthesia Plan  ASA: 3  Anesthesia Plan: General and Regional   Post-op Pain Management:  Regional for Post-op pain   Induction: Intravenous  PONV Risk Score and Plan: 2 and Midazolam, Dexamethasone and Ondansetron  Airway Management Planned: Oral ETT  Additional Equipment:   Intra-op Plan:   Post-operative Plan: Extubation in OR  Informed Consent: I have reviewed the patients History and Physical, chart, labs and discussed the procedure including the risks, benefits and alternatives for the proposed anesthesia with the patient or authorized representative who has indicated his/her understanding and acceptance.     Dental advisory given  Plan Discussed with: CRNA  Anesthesia Plan Comments: (PAT note written 01/24/2021 by Myra Gianotti, PA-C. )       Anesthesia Quick Evaluation

## 2021-01-29 ENCOUNTER — Telehealth: Payer: Self-pay

## 2021-01-29 ENCOUNTER — Encounter (HOSPITAL_COMMUNITY): Payer: Self-pay | Admitting: General Surgery

## 2021-01-29 ENCOUNTER — Ambulatory Visit (HOSPITAL_COMMUNITY)
Admission: RE | Admit: 2021-01-29 | Discharge: 2021-01-29 | Disposition: A | Discharge status: Home / Self Care | Payer: Medicare Other | Source: Ambulatory Visit | Attending: General Surgery | Admitting: General Surgery

## 2021-01-29 ENCOUNTER — Ambulatory Visit (HOSPITAL_COMMUNITY): Payer: Medicare Other | Admitting: Vascular Surgery

## 2021-01-29 ENCOUNTER — Encounter (HOSPITAL_COMMUNITY)
Admission: RE | Disposition: A | Discharge status: Home / Self Care | Payer: Self-pay | Source: Ambulatory Visit | Attending: General Surgery

## 2021-01-29 ENCOUNTER — Ambulatory Visit (HOSPITAL_COMMUNITY): Payer: Medicare Other | Admitting: Anesthesiology

## 2021-01-29 ENCOUNTER — Other Ambulatory Visit: Payer: Self-pay

## 2021-01-29 DIAGNOSIS — K8010 Calculus of gallbladder with chronic cholecystitis without obstruction: Secondary | ICD-10-CM | POA: Insufficient documentation

## 2021-01-29 DIAGNOSIS — Z79890 Hormone replacement therapy (postmenopausal): Secondary | ICD-10-CM | POA: Diagnosis not present

## 2021-01-29 DIAGNOSIS — Z79899 Other long term (current) drug therapy: Secondary | ICD-10-CM | POA: Insufficient documentation

## 2021-01-29 DIAGNOSIS — Z888 Allergy status to other drugs, medicaments and biological substances status: Secondary | ICD-10-CM | POA: Insufficient documentation

## 2021-01-29 DIAGNOSIS — Z7982 Long term (current) use of aspirin: Secondary | ICD-10-CM | POA: Insufficient documentation

## 2021-01-29 DIAGNOSIS — G8918 Other acute postprocedural pain: Secondary | ICD-10-CM | POA: Diagnosis not present

## 2021-01-29 DIAGNOSIS — K8020 Calculus of gallbladder without cholecystitis without obstruction: Secondary | ICD-10-CM | POA: Diagnosis present

## 2021-01-29 DIAGNOSIS — I10 Essential (primary) hypertension: Secondary | ICD-10-CM | POA: Diagnosis not present

## 2021-01-29 DIAGNOSIS — E039 Hypothyroidism, unspecified: Secondary | ICD-10-CM | POA: Diagnosis not present

## 2021-01-29 HISTORY — PX: CHOLECYSTECTOMY: SHX55

## 2021-01-29 SURGERY — LAPAROSCOPIC CHOLECYSTECTOMY
Anesthesia: Regional

## 2021-01-29 MED ORDER — DEXAMETHASONE SODIUM PHOSPHATE 10 MG/ML IJ SOLN
INTRAMUSCULAR | Status: DC | PRN
  Administered 2021-01-29: 4 mg via INTRAVENOUS

## 2021-01-29 MED ORDER — MIDAZOLAM HCL 5 MG/5ML IJ SOLN
INTRAMUSCULAR | Status: DC | PRN
Start: 2021-01-29 — End: 2021-01-29
  Administered 2021-01-29 (×2): 1 mg via INTRAVENOUS

## 2021-01-29 MED ORDER — MIDAZOLAM HCL 2 MG/2ML IJ SOLN
INTRAMUSCULAR | Status: AC
  Filled 2021-01-29: qty 2

## 2021-01-29 MED ORDER — BUPIVACAINE HCL (PF) 0.25 % IJ SOLN
INTRAMUSCULAR | Status: DC | PRN
  Administered 2021-01-29: 14 mL

## 2021-01-29 MED ORDER — ENSURE PRE-SURGERY PO LIQD: 296.0000 mL | Freq: Once | ORAL | Status: DC

## 2021-01-29 MED ORDER — ROCURONIUM BROMIDE 10 MG/ML (PF) SYRINGE
PREFILLED_SYRINGE | INTRAVENOUS | Status: DC | PRN
  Administered 2021-01-29: 60 mg via INTRAVENOUS

## 2021-01-29 MED ORDER — LIDOCAINE 2% (20 MG/ML) 5 ML SYRINGE
INTRAMUSCULAR | Status: DC | PRN
  Administered 2021-01-29: 20 mg via INTRAVENOUS

## 2021-01-29 MED ORDER — MIDAZOLAM HCL 2 MG/2ML IJ SOLN: 2.0000 mg | Freq: Once | INTRAMUSCULAR | Status: AC

## 2021-01-29 MED ORDER — BUPIVACAINE HCL (PF) 0.25 % IJ SOLN
INTRAMUSCULAR | Status: AC
  Filled 2021-01-29: qty 30

## 2021-01-29 MED ORDER — LACTATED RINGERS IV SOLN: INTRAVENOUS | Status: DC

## 2021-01-29 MED ORDER — LIDOCAINE 2% (20 MG/ML) 5 ML SYRINGE
INTRAMUSCULAR | Status: AC
  Filled 2021-01-29: qty 5

## 2021-01-29 MED ORDER — CHLORHEXIDINE GLUCONATE CLOTH 2 % EX PADS: 6.0000 | MEDICATED_PAD | Freq: Once | CUTANEOUS | Status: DC

## 2021-01-29 MED ORDER — ONDANSETRON HCL 4 MG/2ML IJ SOLN
INTRAMUSCULAR | Status: DC | PRN
  Administered 2021-01-29: 4 mg via INTRAVENOUS

## 2021-01-29 MED ORDER — ACETAMINOPHEN 500 MG PO TABS
1000.0000 mg | ORAL_TABLET | ORAL | Status: AC
  Administered 2021-01-29: 1000 mg via ORAL
  Filled 2021-01-29: qty 2

## 2021-01-29 MED ORDER — FENTANYL CITRATE (PF) 100 MCG/2ML IJ SOLN: 25.0000 ug | INTRAMUSCULAR | Status: DC | PRN

## 2021-01-29 MED ORDER — PROPOFOL 10 MG/ML IV BOLUS
INTRAVENOUS | Status: AC
  Filled 2021-01-29: qty 20

## 2021-01-29 MED ORDER — SODIUM CHLORIDE 0.9 % IR SOLN
Status: DC | PRN
  Administered 2021-01-29: 1000 mL

## 2021-01-29 MED ORDER — ROCURONIUM BROMIDE 100 MG/10ML IV SOLN: INTRAVENOUS | Status: DC | PRN

## 2021-01-29 MED ORDER — 0.9 % SODIUM CHLORIDE (POUR BTL) OPTIME
TOPICAL | Status: DC | PRN
  Administered 2021-01-29: 1000 mL

## 2021-01-29 MED ORDER — SUCCINYLCHOLINE CHLORIDE 200 MG/10ML IV SOSY
PREFILLED_SYRINGE | INTRAVENOUS | Status: AC
  Filled 2021-01-29: qty 10

## 2021-01-29 MED ORDER — DEXAMETHASONE SODIUM PHOSPHATE 10 MG/ML IJ SOLN
INTRAMUSCULAR | Status: DC | PRN
  Administered 2021-01-29 (×2): 5 mg

## 2021-01-29 MED ORDER — FENTANYL CITRATE (PF) 100 MCG/2ML IJ SOLN
INTRAMUSCULAR | Status: AC
  Administered 2021-01-29: 50 ug via INTRAVENOUS
  Filled 2021-01-29: qty 2

## 2021-01-29 MED ORDER — FENTANYL CITRATE (PF) 100 MCG/2ML IJ SOLN: INTRAMUSCULAR | Status: DC | PRN

## 2021-01-29 MED ORDER — FENTANYL CITRATE (PF) 250 MCG/5ML IJ SOLN
INTRAMUSCULAR | Status: AC
  Filled 2021-01-29: qty 5

## 2021-01-29 MED ORDER — CHLORHEXIDINE GLUCONATE 0.12 % MT SOLN
15.0000 mL | Freq: Once | OROMUCOSAL | Status: AC
  Administered 2021-01-29: 15 mL via OROMUCOSAL
  Filled 2021-01-29: qty 15

## 2021-01-29 MED ORDER — OXYCODONE HCL 5 MG PO TABS: 5.0000 mg | ORAL_TABLET | Freq: Four times a day (QID) | ORAL | 0 refills | Status: DC | PRN

## 2021-01-29 MED ORDER — HEMOSTATIC AGENTS (NO CHARGE) OPTIME
TOPICAL | Status: DC | PRN
  Administered 2021-01-29: 1 via TOPICAL

## 2021-01-29 MED ORDER — MIDAZOLAM HCL 2 MG/2ML IJ SOLN
INTRAMUSCULAR | Status: AC
  Administered 2021-01-29: 2 mg via INTRAVENOUS
  Filled 2021-01-29: qty 2

## 2021-01-29 MED ORDER — FENTANYL CITRATE (PF) 250 MCG/5ML IJ SOLN
INTRAMUSCULAR | Status: DC | PRN
  Administered 2021-01-29 (×3): 50 ug via INTRAVENOUS
  Administered 2021-01-29: 100 ug via INTRAVENOUS

## 2021-01-29 MED ORDER — CEFAZOLIN SODIUM-DEXTROSE 2-4 GM/100ML-% IV SOLN
2.0000 g | INTRAVENOUS | Status: AC
  Administered 2021-01-29: 2 g via INTRAVENOUS
  Filled 2021-01-29: qty 100

## 2021-01-29 MED ORDER — STERILE WATER FOR INJECTION IV SOLN
INTRAVENOUS | Status: DC | PRN
  Administered 2021-01-29: 1 mL

## 2021-01-29 MED ORDER — ORAL CARE MOUTH RINSE: 15.0000 mL | Freq: Once | OROMUCOSAL | Status: AC

## 2021-01-29 MED ORDER — SUGAMMADEX SODIUM 200 MG/2ML IV SOLN
INTRAVENOUS | Status: DC | PRN
  Administered 2021-01-29: 200 mg via INTRAVENOUS

## 2021-01-29 MED ORDER — DEXAMETHASONE SODIUM PHOSPHATE 10 MG/ML IJ SOLN
INTRAMUSCULAR | Status: AC
  Filled 2021-01-29: qty 1

## 2021-01-29 MED ORDER — ONDANSETRON HCL 4 MG/2ML IJ SOLN
INTRAMUSCULAR | Status: AC
  Filled 2021-01-29: qty 2

## 2021-01-29 MED ORDER — ROPIVACAINE HCL 5 MG/ML IJ SOLN
INTRAMUSCULAR | Status: DC | PRN
  Administered 2021-01-29 (×2): 25 mL via PERINEURAL

## 2021-01-29 MED ORDER — FENTANYL CITRATE (PF) 100 MCG/2ML IJ SOLN: 50.0000 ug | Freq: Once | INTRAMUSCULAR | Status: AC

## 2021-01-29 MED ORDER — ROCURONIUM BROMIDE 10 MG/ML (PF) SYRINGE
PREFILLED_SYRINGE | INTRAVENOUS | Status: AC
  Filled 2021-01-29: qty 10

## 2021-01-29 MED ORDER — PROPOFOL 10 MG/ML IV BOLUS
INTRAVENOUS | Status: DC | PRN
  Administered 2021-01-29: 150 mg via INTRAVENOUS

## 2021-01-29 SURGICAL SUPPLY — 40 items
ADH SKN CLS APL DERMABOND .7 (GAUZE/BANDAGES/DRESSINGS) ×1
APL PRP STRL LF DISP 70% ISPRP (MISCELLANEOUS) ×1
APPLIER CLIP 5 13 M/L LIGAMAX5 (MISCELLANEOUS) ×2
APR CLP MED LRG 5 ANG JAW (MISCELLANEOUS) ×1
BAG COUNTER SPONGE SURGICOUNT (BAG) ×2 IMPLANT
BAG SPEC RTRVL 10 TROC 200 (ENDOMECHANICALS) ×1
BAG SPNG CNTER NS LX DISP (BAG) ×1
BLADE CLIPPER SURG (BLADE) ×1 IMPLANT
CANISTER SUCT 3000ML PPV (MISCELLANEOUS) ×2 IMPLANT
CHLORAPREP W/TINT 26 (MISCELLANEOUS) ×2 IMPLANT
CLIP APPLIE 5 13 M/L LIGAMAX5 (MISCELLANEOUS) ×1 IMPLANT
COVER SURGICAL LIGHT HANDLE (MISCELLANEOUS) ×2 IMPLANT
DERMABOND ADVANCED (GAUZE/BANDAGES/DRESSINGS) ×1
DERMABOND ADVANCED .7 DNX12 (GAUZE/BANDAGES/DRESSINGS) ×1 IMPLANT
ELECT REM PT RETURN 9FT ADLT (ELECTROSURGICAL) ×2
ELECTRODE REM PT RTRN 9FT ADLT (ELECTROSURGICAL) ×1 IMPLANT
GLOVE SURG ENC MOIS LTX SZ7 (GLOVE) ×2 IMPLANT
GLOVE SURG UNDER POLY LF SZ7.5 (GLOVE) ×2 IMPLANT
GOWN STRL REUS W/ TWL LRG LVL3 (GOWN DISPOSABLE) ×3 IMPLANT
GOWN STRL REUS W/TWL LRG LVL3 (GOWN DISPOSABLE) ×6
KIT BASIN OR (CUSTOM PROCEDURE TRAY) ×2 IMPLANT
KIT TURNOVER KIT B (KITS) ×2 IMPLANT
NS IRRIG 1000ML POUR BTL (IV SOLUTION) ×2 IMPLANT
PAD ARMBOARD 7.5X6 YLW CONV (MISCELLANEOUS) ×2 IMPLANT
POUCH RETRIEVAL ECOSAC 10 (ENDOMECHANICALS) ×1 IMPLANT
POUCH RETRIEVAL ECOSAC 10MM (ENDOMECHANICALS) ×2
SCISSORS LAP 5X35 DISP (ENDOMECHANICALS) ×2 IMPLANT
SET IRRIG TUBING LAPAROSCOPIC (IRRIGATION / IRRIGATOR) ×2 IMPLANT
SET TUBE SMOKE EVAC HIGH FLOW (TUBING) ×2 IMPLANT
SLEEVE ENDOPATH XCEL 5M (ENDOMECHANICALS) ×4 IMPLANT
SPECIMEN JAR SMALL (MISCELLANEOUS) ×2 IMPLANT
STRIP CLOSURE SKIN 1/2X4 (GAUZE/BANDAGES/DRESSINGS) ×2 IMPLANT
SUT MNCRL AB 4-0 PS2 18 (SUTURE) ×2 IMPLANT
SUT VICRYL 0 UR6 27IN ABS (SUTURE) ×2 IMPLANT
TOWEL GREEN STERILE (TOWEL DISPOSABLE) ×2 IMPLANT
TOWEL GREEN STERILE FF (TOWEL DISPOSABLE) ×2 IMPLANT
TRAY LAPAROSCOPIC MC (CUSTOM PROCEDURE TRAY) ×2 IMPLANT
TROCAR XCEL BLUNT TIP 100MML (ENDOMECHANICALS) ×2 IMPLANT
TROCAR XCEL NON-BLD 5MMX100MML (ENDOMECHANICALS) ×2 IMPLANT
WATER STERILE IRR 1000ML POUR (IV SOLUTION) ×2 IMPLANT

## 2021-01-29 NOTE — Interval H&P Note (Signed)
History and Physical Interval Note:  01/29/2021 2:26 PM  Jeffrey Acosta  has presented today for surgery, with the diagnosis of GALLSTONES.  The various methods of treatment have been discussed with the patient and family. After consideration of risks, benefits and other options for treatment, the patient has consented to  Procedure(s): LAPAROSCOPIC CHOLECYSTECTOMY WITH ICG DYE (N/A) INDOCYANINE GREEN FLUORESCENCE IMAGING (ICG) (N/A) as a surgical intervention.  The patient's history has been reviewed, patient examined, no change in status, stable for surgery.  I have reviewed the patient's chart and labs.  Questions were answered to the patient's satisfaction.     Rolm Bookbinder

## 2021-01-29 NOTE — Anesthesia Procedure Notes (Signed)
Procedure Name: Intubation Date/Time: 01/29/2021 2:59 PM Performed by: Geraldine Contras, CRNA Pre-anesthesia Checklist: Patient identified, Patient being monitored, Timeout performed, Emergency Drugs available and Suction available Patient Re-evaluated:Patient Re-evaluated prior to induction Oxygen Delivery Method: Circle system utilized Preoxygenation: Pre-oxygenation with 100% oxygen Induction Type: IV induction Ventilation: Mask ventilation without difficulty Laryngoscope Size: Mac and 4 Grade View: Grade I Tube type: Oral Tube size: 7.5 mm Number of attempts: 1 Airway Equipment and Method: Stylet Placement Confirmation: ETT inserted through vocal cords under direct vision, positive ETCO2 and breath sounds checked- equal and bilateral Secured at: 21 cm Tube secured with: Tape Dental Injury: Teeth and Oropharynx as per pre-operative assessment  Comments: Small cut from mask ventilation with oral airway prior to DL

## 2021-01-29 NOTE — Transfer of Care (Signed)
Immediate Anesthesia Transfer of Care Note  Patient: Jeffrey Acosta  Procedure(s) Performed: LAPAROSCOPIC CHOLECYSTECTOMY WITH ICG DYE INDOCYANINE GREEN FLUORESCENCE IMAGING (ICG)  Patient Location: PACU  Anesthesia Type:General  Level of Consciousness: sedated  Airway & Oxygen Therapy: Patient Spontanous Breathing  Post-op Assessment: Report given to RN  Post vital signs: stable  Last Vitals:  Vitals Value Taken Time  BP 164/76 01/29/21 1623  Temp    Pulse 56 01/29/21 1625  Resp 14 01/29/21 1625  SpO2 100 % 01/29/21 1625  Vitals shown include unvalidated device data.  Last Pain:  Vitals:   01/29/21 1254  TempSrc:   PainSc: 0-No pain      Patients Stated Pain Goal: 0 (49/17/91 5056)  Complications: No notable events documented.

## 2021-01-29 NOTE — Anesthesia Procedure Notes (Signed)
Anesthesia Regional Block: Quadratus lumborum   Pre-Anesthetic Checklist: , timeout performed,  Correct Patient, Correct Site, Correct Laterality,  Correct Procedure, Correct Position, site marked,  Risks and benefits discussed,  Surgical consent,  Pre-op evaluation,  At surgeon's request and post-op pain management  Laterality: Right  Prep: Maximum Sterile Barrier Precautions used, chloraprep       Needles:  Injection technique: Single-shot  Needle Type: Echogenic Stimulator Needle     Needle Length: 9cm  Needle Gauge: 22     Additional Needles:   Procedures:,,,, ultrasound used (permanent image in chart),,    Narrative:  Start time: 01/29/2021 2:14 PM End time: 01/29/2021 2:18 PM Injection made incrementally with aspirations every 5 mL.  Performed by: Personally  Anesthesiologist: Freddrick March, MD  Additional Notes: Monitors applied. No increased pain on injection. No increased resistance to injection. Injection made in 5cc increments. Good needle visualization. Patient tolerated procedure well.

## 2021-01-29 NOTE — Op Note (Signed)
Preoperative diagnosis: Cholecystitis, history choledocholithiasis Postoperative diagnosis: Chronic cholecystitis Procedure: Laparoscopic cholecystectomy Surgeon: Dr. Serita Grammes Resident: Dr Genella Mech Anesthesia: General with a tap block Estimated blood loss: Minimal Complications: None Drains: None Specimens: Gallbladder and contents to pathology Sponge needle count was correct at completion Disposition to recovery stable condition  Indications: 70 year old male I know well from a prior inguinal hernia repair. Since then he was noted in 2020 to have bile duct dilatation. He had an abdominal ultrasound that showed multiple gallstones and an 11 mm bile duct. He was not having any abdominal pain at that time. He underwent an MRCP with mild diffuse dilatation of the biliary tree without any choledocholithiasis. He underwent EUS and ERCP that showed moderate common bile duct dilatation and a filling defect in the distal duct. There were multiple stones in the gallbladder. ERCP with sphincterotomy was performed at that time and there was sludge but no real stones. He did have brushings that were negative for any malignancy. Since then he has done well except recently has started developing every other day to every day epigastric and right upper quadrant pain after eating a heavy meal. This is getting worse. It is becoming more frequent. We discussed proceeding with cholecystectomy.  Procedure: After informed consent was obtained the patient first underwent a bilateral tap block with anesthesia.  She was given antibiotics.  SCDs were in place.  She was then placed under general anesthesia without complication.  She was prepped and draped in the standard sterile surgical fashion.  A surgical timeout was then performed.  I infiltrated Marcaine below the umbilicus.  I made a vertical incision.  I grasped the fascia and incised it sharply.  I entered the peritoneum bluntly.  There was no evidence of an  entry injury.  I then placed a 0 Vicryl pursestring suture through the fascia.  I then placed a Hassan trocar and insufflated the abdomen to 15 mmHg pressure.  I then placed 3 additional 5 mm trocars in epigastrium and right side of the abdomen under direct vision without complication.  His gallbladder was noted to have chronic cholecystitis.  I then retracted the gallbladder cephalad and lateral.  I did have him injected with ICG dye and was able to identify the CBD.   I was able to obtain the critical view of safety and lift the gallbladder off the liver.  I then clipped the cystic duct 4 times.  I divided it leaving 3 clips in place.  The cystic duct was viable and the clips completely traversed the duct.  I treated the artery in a similar fashion.  The gallbladder was then removed from the liver bed.  It was very adherent to the liver and I did enter into the gallbladder.  There were stones that fell out that I removed later.  I then placed the gallbladder in retrieval bag.  I placed the stones in the retrieval bag.  I removed all of this from the umbilical trocar site.  Hemostasis was obtained.  I did place surgicel snow in the gallbladder bed. Irrigation was performed until this was clear.  I then removed the [facility] trocar.  I tied my pursestring down.  I placed an additional 0 Vicryl suture using the suture passer device to completely obliterate this defect.  I then desufflated the abdomen and remove the remaining trocars.  These were closed with 4-0 Monocryl and glue.  He tolerated this well was extubated transferred to recovery stable.

## 2021-01-29 NOTE — Discharge Instructions (Signed)
[facility] LAPAROSCOPIC SURGERY: POST OP INSTRUCTIONS  Always review your discharge instruction sheet given to you by the facility where your surgery was performed. IF YOU HAVE DISABILITY OR FAMILY LEAVE FORMS, YOU MUST BRING THEM TO THE OFFICE FOR PROCESSING.   DO NOT GIVE THEM TO YOUR DOCTOR.  A prescription for pain medication may be given to you upon discharge.  Take your pain medication as prescribed, if needed.  If narcotic pain medicine is not needed, then you may take acetaminophen (Tylenol), naprosyn (Alleve), or ibuprofen (Advil) as needed. Take your usually prescribed medications unless otherwise directed. If you need a refill on your pain medication, please contact your pharmacy.  They will contact our office to request authorization. Prescriptions will not be filled after 5pm or on week-ends. You should follow a light diet the first few days after arrival home, such as soup and crackers, etc.  Be sure to include lots of fluids daily. Most patients will experience some swelling and bruising in the area of the incisions.  Ice packs will help.  Swelling and bruising can take several days to resolve.  It is common to experience some constipation if taking pain medication after surgery.  Increasing fluid intake and taking a stool softener (such as Colace) will usually help or prevent this problem from occurring.  A mild laxative (Milk of Magnesia or Miralax) should be taken according to package instructions if there are no bowel movements after 48 hours. Unless discharge instructions indicate otherwise, you may remove your bandages 48 hours after surgery, and you may shower at that time.  You may have steri-strips (small skin tapes) in place directly over the incision.  These strips should be left on the skin for 7-10 days.  If your surgeon used skin glue on the incision, you may shower in 24 hours.  The glue will flake off over the next  2-3 weeks.  Any sutures or staples will be removed at the office during your follow-up visit. ACTIVITIES:  You may resume regular (light) daily activities beginning the next day--such as daily self-care, walking, climbing stairs--gradually increasing activities as tolerated.  You may have sexual intercourse when it is comfortable.  Refrain from any heavy lifting or straining until approved by your doctor. You may drive when you are no longer taking prescription pain medication, you can comfortably wear a seatbelt, and you can safely maneuver your car and apply brakes. RETURN TO WORK:  __________________________________________________________ You should see your doctor in the office for a follow-up appointment approximately 2-3 weeks after your surgery.  Make sure that you call for this appointment within a day or two after you arrive home to insure a convenient appointment time. OTHER INSTRUCTIONS: __________________________________________________________________________________________________________________________ __________________________________________________________________________________________________________________________ WHEN TO CALL YOUR DOCTOR: Fever over 101.0 Inability to urinate Continued bleeding from incision. Increased pain, redness, or drainage from the incision. Increasing abdominal pain  The clinic staff is available to answer your questions during regular business hours.  Please don't hesitate to call and ask to speak to one of the nurses for clinical concerns.  If you have a medical emergency, go to the nearest emergency room or call 911.  A surgeon from [facility] is always on call at the hospital. [address], [address], [place], [place]  [address] ? [address] [address], [place], [place]   [address] [phone] ? [phone] ? FAX [phone] Web site: [email or web address]  

## 2021-01-29 NOTE — Anesthesia Procedure Notes (Signed)
Anesthesia Regional Block: Quadratus lumborum   Pre-Anesthetic Checklist: , timeout performed,  Correct Patient, Correct Site, Correct Laterality,  Correct Procedure, Correct Position, site marked,  Risks and benefits discussed,  Surgical consent,  Pre-op evaluation,  At surgeon's request and post-op pain management  Laterality: Left  Prep: Maximum Sterile Barrier Precautions used, chloraprep       Needles:  Injection technique: Single-shot  Needle Type: Echogenic Stimulator Needle     Needle Length: 9cm  Needle Gauge: 22     Additional Needles:   Procedures:,,,, ultrasound used (permanent image in chart),,    Narrative:  Start time: 01/29/2021 2:10 PM End time: 01/29/2021 2:14 PM Injection made incrementally with aspirations every 5 mL.  Performed by: Personally  Anesthesiologist: Freddrick March, MD  Additional Notes: Monitors applied. No increased pain on injection. No increased resistance to injection. Injection made in 5cc increments. Good needle visualization. Patient tolerated procedure well.

## 2021-01-29 NOTE — Telephone Encounter (Addendum)
"  LINQ alert received.  2 false AF episodes, 4-38min in duration.  EGM's show ST with ectopy. Presenting rhythm is regular V sense, rate 140-150 Route to triage for presenting. LR"  Unsuccessful telephone call to patient to assess for s/s during tachy arrythmia 01/28/21 @ 7:44 am. Hipaa compliant VM message left requesting call back to [phone].  Today's (01/29/21) presenting ECG   Presenting EGM 01/28/21

## 2021-01-29 NOTE — H&P (Signed)
70 year old male I know well from a prior inguinal hernia repair. Since then he was noted in 2020 to have bile duct dilatation. He had an abdominal ultrasound that showed multiple gallstones and an 11 mm bile duct. He was not having any abdominal pain at that time. He underwent an MRCP with mild diffuse dilatation of the biliary tree without any choledocholithiasis. He underwent EUS and ERCP that showed moderate common bile duct dilatation and a filling defect in the distal duct. There were multiple stones in the gallbladder. ERCP with sphincterotomy was performed at that time and there was sludge but no real stones. He did have brushings that were negative for any malignancy. Since then he has done well except recently has started developing every other day to every day epigastric and right upper quadrant pain after eating a heavy meal. This is getting worse. It is becoming more frequent. It is not associated with any nausea or vomiting. His bowel movements have not changed. He saw his primary care physician and underwent an x-ray which showed dilated small bowel. He really has no symptoms of an obstruction. He is here today to discuss options.  Review of Systems: A complete review of systems was obtained from the patient. I have reviewed this information and discussed as appropriate with the patient. See HPI as well for other ROS.  Review of Systems  Gastrointestinal: Positive for abdominal pain.  All other systems reviewed and are negative.   Medical History: Past Medical History:  Diagnosis Date   GERD (gastroesophageal reflux disease)   There is no problem list on file for this patient.  Past Surgical History:  Procedure Laterality Date   REPAIR INGUINAL HERNIA Right    Allergies  Allergen Reactions   Iohexol Hives  Desc: developed hives after injection; resolved with 50 mg benadryl given to pt.   Current Outpatient Medications on File Prior to Visit  Medication Sig Dispense Refill    allopurinoL (ZYLOPRIM) 300 MG tablet Take 1 tablet by mouth once daily   esomeprazole (NEXIUM) 40 MG DR capsule Take by mouth   folic acid (FOLVITE) 1 MG tablet   lamoTRIgine (LAMICTAL) 25 MG tablet Take 25 mg by mouth once daily   levothyroxine (SYNTHROID) 50 MCG tablet Take by mouth   metoprolol succinate (TOPROL-XL) 100 MG XL tablet   thiamine (VITAMIN B-1) 100 MG tablet Take 100 mg by mouth once daily   timoloL maleate (TIMOPTIC) 0.5 % ophthalmic solution 1 drop 2 (two) times daily.   acetaminophen-codeine (TYLENOL #3) 300-30 mg tablet Take 1 tablet by mouth every 6 (six) hours as needed   aspirin 81 MG EC tablet Take by mouth   atorvastatin (LIPITOR) 20 MG tablet   b complex vitamins capsule Take 1 capsule by mouth once daily   cholecalciferol (VITAMIN D3) 2,000 unit tablet Take by mouth   cyanocobalamin (VITAMIN B12) 500 MCG tablet Take by mouth   dicyclomine (BENTYL) 20 mg tablet   ergocalciferol, vitamin D2, 1,250 mcg (50,000 unit) capsule Take 50,000 Units by mouth every 7 (seven) days   esomeprazole (NEXIUM) 40 mg injection   fluticasone propionate (FLONASE) 50 mcg/actuation nasal spray INSERT 2 SPRAYS INTO THE NOSE EVERY DAY AT NIGHT   levETIRAcetam (KEPPRA) 1000 MG tablet   niMODipine (NIMOTOP) 30 MG capsule TAKE 2 CAPSULES (60 MG TOTAL) BY MOUTH EVERY FOUR HOURS FOR 18 DAYS.   QUEtiapine (SEROQUEL XR) 400 MG 24 hr tablet Take 400 mg by mouth at bedtime   No current  facility-administered medications on file prior to visit.   Family History  Problem Relation Age of Onset   Coronary Artery Disease (Blocked arteries around heart) Father    Social History   Tobacco Use  Smoking Status Never Smoker  Smokeless Tobacco Never Used    Social History   Socioeconomic History   Marital status: Married  Tobacco Use   Smoking status: Never Smoker   Smokeless tobacco: Never Used  Substance and Sexual Activity   Alcohol use: Yes  Comment: occasionally   Drug use: Never    Objective:   Body mass index is 25.28 kg/m.  Physical Exam Constitutional:  Appearance: Normal appearance.  Abdominal:  General: There is no distension.  Palpations: Abdomen is soft.  Tenderness: There is no abdominal tenderness.  Hernia: No hernia is present.  Neurological:  Mental Status: He is alert.     Assessment and Plan:   Calculus of gallbladder without cholecystitis without obstruction  Laparoscopic cholecystectomy  I do think he needs his gallbladder out with his history. We discussed this at length today. Prior to that I am going obtain cardiology clearance as he is wearing a loop recorder right now. I am fine to keep him on his 81 mg aspirin throughout surgery. He had an x-ray early August that also shows dilated small bowel for some unclear reason. He has not had any abdominal imaging in some time and I am going to send him for a CT scan prior to his surgery as well.  I discussed the procedure in detail. We discussed the risks and benefits of a laparoscopic cholecystectomy and possible cholangiogram including, but not limited to bleeding, infection, injury to surrounding structures such as the intestine or liver, bile leak, retained gallstones, need to convert to an open procedure, prolonged diarrhea, blood clots such as DVT, common bile duct injury, anesthesia risks, and possible need for additional procedures. The likelihood of improvement in symptoms and return to the patient's normal status is good. We discussed the typical post-operative recovery course.

## 2021-01-30 ENCOUNTER — Encounter (INDEPENDENT_AMBULATORY_CARE_PROVIDER_SITE_OTHER): Payer: Self-pay

## 2021-01-30 ENCOUNTER — Telehealth: Payer: Self-pay

## 2021-01-30 ENCOUNTER — Encounter (HOSPITAL_COMMUNITY): Payer: Self-pay | Admitting: General Surgery

## 2021-01-30 NOTE — Telephone Encounter (Signed)
Patient called in returning nurse call about alert on device

## 2021-01-30 NOTE — Telephone Encounter (Addendum)
Spoke with pt regarding transmission from 01/28/21.  He reports that on the morning of 10/4 he was experiencing dizzy spells throughout the morning.  The spells resolved around noontime.    Pt underwent procedure yesterday- Laparoscopic cholecystectomy, reports doing well today.    Current meds include Metoprolol 150mg  daily.    Pt implanted for Crytpogenic stroke.  LOV with Dr. Sallyanne Kuster 08/26/20.     Advised I would forward information to Dr. Sallyanne Kuster for review.  Anticipate continued monitoring.     Benedetto Goad, RN     12:11 PM Note "LINQ alert received.  2 false AF episodes, 4-18min in duration.  EGM's show ST with ectopy. Presenting rhythm is regular V sense, rate 140-150 Route to triage for presenting. LR"   Unsuccessful telephone call to patient to assess for s/s during tachy arrythmia 01/28/21 @ 7:44 am. Hipaa compliant VM message left requesting call back to [phone].   Today's (01/29/21) presenting ECG   Presenting EGM 01/28/21

## 2021-01-30 NOTE — Anesthesia Postprocedure Evaluation (Signed)
Anesthesia Post Note  Patient: Jeffrey Acosta  Procedure(s) Performed: LAPAROSCOPIC CHOLECYSTECTOMY WITH ICG DYE INDOCYANINE GREEN FLUORESCENCE IMAGING (ICG)     Patient location during evaluation: PACU Anesthesia Type: Regional and General Level of consciousness: awake and alert Pain management: pain level controlled Vital Signs Assessment: post-procedure vital signs reviewed and stable Respiratory status: spontaneous breathing, nonlabored ventilation, respiratory function stable and patient connected to nasal cannula oxygen Cardiovascular status: blood pressure returned to baseline and stable Postop Assessment: no apparent nausea or vomiting Anesthetic complications: no   No notable events documented.  Last Vitals:  Vitals:   01/29/21 1654 01/29/21 1711  BP: (!) 179/79 (!) 159/96  Pulse: (!) 59 60  Resp: 12 16  Temp:  36.6 C  SpO2: 100% 99%    Last Pain:  Vitals:   01/29/21 1711  TempSrc:   PainSc: 3                  Kerrigan Gombos L Daneesha Quinteros

## 2021-01-31 LAB — SURGICAL PATHOLOGY

## 2021-02-03 DIAGNOSIS — Z9049 Acquired absence of other specified parts of digestive tract: Secondary | ICD-10-CM | POA: Diagnosis not present

## 2021-02-03 DIAGNOSIS — G8918 Other acute postprocedural pain: Secondary | ICD-10-CM | POA: Diagnosis not present

## 2021-02-03 NOTE — Telephone Encounter (Signed)
Pt has a link 2 and can not force a transmission. Monitor should transmit automatically daily.

## 2021-02-04 ENCOUNTER — Other Ambulatory Visit (HOSPITAL_COMMUNITY): Payer: Self-pay | Admitting: General Surgery

## 2021-02-04 ENCOUNTER — Other Ambulatory Visit: Payer: Self-pay

## 2021-02-04 ENCOUNTER — Ambulatory Visit (HOSPITAL_COMMUNITY)
Admission: RE | Admit: 2021-02-04 | Discharge: 2021-02-04 | Disposition: A | Discharge status: Home / Self Care | Payer: Medicare Other | Source: Ambulatory Visit | Attending: General Surgery | Admitting: General Surgery

## 2021-02-04 ENCOUNTER — Other Ambulatory Visit: Payer: Self-pay | Admitting: General Surgery

## 2021-02-04 DIAGNOSIS — N200 Calculus of kidney: Secondary | ICD-10-CM | POA: Diagnosis not present

## 2021-02-04 DIAGNOSIS — R1010 Upper abdominal pain, unspecified: Secondary | ICD-10-CM | POA: Diagnosis not present

## 2021-02-04 DIAGNOSIS — M5137 Other intervertebral disc degeneration, lumbosacral region: Secondary | ICD-10-CM | POA: Diagnosis not present

## 2021-02-04 DIAGNOSIS — I700 Atherosclerosis of aorta: Secondary | ICD-10-CM | POA: Diagnosis not present

## 2021-02-04 DIAGNOSIS — N3289 Other specified disorders of bladder: Secondary | ICD-10-CM | POA: Diagnosis not present

## 2021-02-04 DIAGNOSIS — T8143XA Infection following a procedure, organ and space surgical site, initial encounter: Secondary | ICD-10-CM

## 2021-02-04 MED ORDER — IOHEXOL 350 MG/ML SOLN: 80.0000 mL | Freq: Once | INTRAVENOUS | Status: DC | PRN

## 2021-02-05 ENCOUNTER — Ambulatory Visit (HOSPITAL_COMMUNITY)
Admission: RE | Admit: 2021-02-05 | Discharge: 2021-02-05 | Disposition: A | Discharge status: Home / Self Care | Payer: Medicare Other | Source: Ambulatory Visit | Attending: General Surgery | Admitting: General Surgery

## 2021-02-05 ENCOUNTER — Other Ambulatory Visit: Payer: Self-pay | Admitting: Student

## 2021-02-05 ENCOUNTER — Encounter (HOSPITAL_COMMUNITY): Payer: Self-pay

## 2021-02-05 DIAGNOSIS — K828 Other specified diseases of gallbladder: Secondary | ICD-10-CM | POA: Diagnosis not present

## 2021-02-05 DIAGNOSIS — T8143XA Infection following a procedure, organ and space surgical site, initial encounter: Secondary | ICD-10-CM | POA: Diagnosis not present

## 2021-02-05 HISTORY — PX: IR GUIDED DRAIN W CATHETER PLACEMENT: IMG719

## 2021-02-05 MED ORDER — FENTANYL CITRATE (PF) 100 MCG/2ML IJ SOLN
INTRAMUSCULAR | Status: DC | PRN
  Administered 2021-02-05: 25 ug via INTRAVENOUS

## 2021-02-05 MED ORDER — FENTANYL CITRATE (PF) 100 MCG/2ML IJ SOLN
INTRAMUSCULAR | Status: AC
  Filled 2021-02-05: qty 2

## 2021-02-05 MED ORDER — SODIUM CHLORIDE 0.9 % IV SOLN: INTRAVENOUS | Status: DC

## 2021-02-05 MED ORDER — LIDOCAINE HCL 1 % IJ SOLN
INTRAMUSCULAR | Status: AC
  Filled 2021-02-05: qty 20

## 2021-02-05 MED ORDER — MIDAZOLAM HCL 2 MG/2ML IJ SOLN
INTRAMUSCULAR | Status: DC | PRN
  Administered 2021-02-05: 1 mg via INTRAVENOUS

## 2021-02-05 MED ORDER — MIDAZOLAM HCL 2 MG/2ML IJ SOLN
INTRAMUSCULAR | Status: AC
  Filled 2021-02-05: qty 2

## 2021-02-05 MED ORDER — LIDOCAINE HCL (PF) 1 % IJ SOLN
INTRAMUSCULAR | Status: DC | PRN
  Administered 2021-02-05: 10 mL

## 2021-02-05 NOTE — Procedures (Addendum)
Interventional Radiology Procedure Note  Procedure: Image guided drain placement, gallbladder fossa.  43F pigtail drain.  Complications: None  EBL: None Sample: Culture sent  Recommendations: - Routine drain care, with sterile flushes, record output - follow up Cx - routine wound care - ok for dc in 1 hr - drain care education - office will call with appt follow up  Signed,  Dulcy Fanny. Earleen Newport, DO

## 2021-02-05 NOTE — H&P (Signed)
Chief Complaint: Patient was seen in consultation today for image guided aspiration and possible drain placement at the request of [facility]  Referring Physician(s): [facility]  Supervising Physician: Corrie Mckusick  Patient Status: [facility] - Out-pt  History of Present Illness: Jeffrey Acosta is a 70 y.o. male with PMHs of hyperlipidemia, hypothyroidism, arrhythmia s/p loop recorder placement on 08/26/2020, SAH, bipolar disorder, migraines, alcoholism in remission,cholecystitis with choledocholithiasis s/p cholecystectomy on 01/29/2021 who underwent CT abdomen pelvis without contrast on 02/04/2021 due to upper abdominal pain which showed gas and fluid collection in the gallbladder fossa with air-fluid level and surrounding stranding.  IR was requested for image guided aspiration and drain placement for the fluid collection within the gallbladder fossa.  Patient laying in bed, not in acute distress.  Reports RUQ pain that wraps around to the back.  Denise headache, fever, chills, shortness of breath, cough, chest pain, nausea ,vomiting, and bleeding.   Past Medical History:  Diagnosis Date   Alcoholism in remission ([place])    in AA   Arrhythmia    atrial tachycardia   Bipolar affective disorder ([place])    Diverticulosis of colon (without mention of hemorrhage)    GERD (gastroesophageal reflux disease)    Gout of multiple sites    Hiatal hernia    Hx of migraines    Hydrocele    HYPERLIPIDEMIA 01/10/2009   Qualifier: Diagnosis of  By: Ronnald Ramp CMA, [place]    NMR Lipoprofile 2011 LDL could not be calculated  Due to TG of 889  (811  / 750 ),  HDL 50 . Father MI @ 74 PGM CVA early 61s; PGF CVA @72     Hypothyroidism    Melanoma of skin, site unspecified 11/25/2012   07/2011 abdominal  Stage 1 Dr Dematteo Lacy [place] Dr Sarajane Jews    Osteopenia    Polyarthralgia    Status post placement of implantable loop recorder 08/26/2020   Stroke [facility])    Subarachnoid hemorrhage [facility])     Past  Surgical History:  Procedure Laterality Date   BILIARY BRUSHING  12/07/2018   Procedure: BILIARY BRUSHING;  Surgeon: Irving Copas., MD;  Location: [facility];  Service: Gastroenterology;;   BILIARY DILATION  12/07/2018   Procedure: BILIARY DILATION;  Surgeon: Irving Copas., MD;  Location: [facility];  Service: Gastroenterology;;   BIOPSY  12/07/2018   Procedure: BIOPSY;  Surgeon: Irving Copas., MD;  Location: [facility];  Service: Gastroenterology;;   CHOLECYSTECTOMY N/A 01/29/2021   Procedure: LAPAROSCOPIC CHOLECYSTECTOMY WITH ICG DYE;  Surgeon: Rolm Bookbinder, MD;  Location: [facility];  Service: General;  Laterality: N/A;   COLONOSCOPY  2003   negative ; Dr Sharlett Iles   ERCP N/A 12/07/2018   Procedure: ENDOSCOPIC RETROGRADE CHOLANGIOPANCREATOGRAPHY (ERCP);  Surgeon: Irving Copas., MD;  Location: [facility];  Service: Gastroenterology;  Laterality: N/A;   ESOPHAGOGASTRODUODENOSCOPY (EGD) WITH PROPOFOL N/A 12/07/2018   Procedure: ESOPHAGOGASTRODUODENOSCOPY (EGD) WITH PROPOFOL;  Surgeon: Rush Landmark Telford Nab., MD;  Location: [facility];  Service: Gastroenterology;  Laterality: N/A;   EUS N/A 12/07/2018   Procedure: UPPER ENDOSCOPIC ULTRASOUND (EUS) RADIAL;  Surgeon: Irving Copas., MD;  Location: [facility];  Service: Gastroenterology;  Laterality: N/A;   INGUINAL HERNIA REPAIR  08/07/2011   Procedure: HERNIA REPAIR INGUINAL ADULT;  Surgeon: Rolm Bookbinder, MD;  Location: [place];  Service: General;  Laterality: Right;   IR ANGIO INTRA EXTRACRAN SEL INTERNAL CAROTID BILAT MOD SED  05/14/2020   IR ANGIO VERTEBRAL SEL VERTEBRAL BILAT MOD  SED  05/14/2020   LOOP RECORDER INSERTION     06/2020   MELANOMA EXCISION  07/28/11   Dr Sarajane Jews; stomach and chest   REMOVAL OF STONES  12/07/2018   Procedure: REMOVAL OF STONES;  Surgeon: Rush Landmark Telford Nab., MD;  Location: [facility];  Service: Gastroenterology;;   TONSILLECTOMY   1951   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 Authorizing Provider  Acetaminophen-Codeine 300-30 MG tablet Take 1 tablet by mouth every 6 (six) hours as needed for pain. 05/04/20   [provider]  allopurinol (ZYLOPRIM) 300 MG tablet Take 1 tablet (300 mg total) by mouth daily. 08/12/20   Colon Branch, MD  aspirin EC 81 MG tablet Take 81 mg by mouth daily. Swallow whole.    [provider]  b complex vitamins capsule Take 1 capsule by mouth daily.    [provider]  Cholecalciferol (VITAMIN D) 50 MCG (2000 UT) tablet Take 2,000 Units by mouth daily.    [provider]  dicyclomine (BENTYL) 20 MG tablet TAKE 1 TABLET(20 MG) BY MOUTH THREE TIMES DAILY AS NEEDED FOR SPASMS 12/27/20   Colon Branch, MD  esomeprazole (NEXIUM) 40 MG capsule TAKE 1 CAPSULE(40 MG) BY MOUTH DAILY BEFORE BREAKFAST 01/24/21   Colon Branch, MD  fluticasone [facility]) 50 MCG/ACT nasal spray Place 2 sprays into both nostrils daily as needed for congestion. 09/25/20   [provider]  folic acid (FOLVITE) 1 MG tablet Take 1 tablet (1 mg total) by mouth daily. 06/12/20   Colon Branch, MD  lamoTRIgine (LAMICTAL) 25 MG tablet Take 25 mg by mouth daily. 12/30/19   [provider]  levothyroxine (SYNTHROID) 50 MCG tablet TAKE 1 TABLET(50 MCG) BY MOUTH DAILY BEFORE BREAKFAST 01/24/21   Colon Branch, MD  metoprolol succinate (TOPROL-XL) 100 MG 24 hr tablet TAKE 1 AND 1/2 TABLETS(150 MG) BY MOUTH DAILY WITH OR IMMEDIATELY FOLLOWING A MEAL 01/24/21   Paz, Alda Berthold, MD  naltrexone (DEPADE) 50 MG tablet Take 50 mg by mouth daily.  01/15/16   [provider]  oxyCODONE (OXY IR/ROXICODONE) 5 MG immediate release tablet Take 1 tablet (5 mg total) by mouth every 6 (six) hours as needed for  moderate pain, severe pain or breakthrough pain. 01/29/21   Rolm Bookbinder, MD  QUEtiapine (SEROQUEL) 400 MG tablet Take 800 mg by mouth at bedtime.    [provider]  thiamine 100 MG tablet TAKE 1 TABLET (100 MG TOTAL) BY MOUTH DAILY. 05/16/20 05/16/21  Florencia Reasons, MD  Thiamine HCl (VITAMIN B-1) 250 MG tablet Take 1 tablet (250 mg total) by mouth daily. 06/12/20   Colon Branch, MD  timolol (TIMOPTIC) 0.5 % ophthalmic solution Place 1 drop into both eyes 2 (two) times daily. 03/17/19   [provider]  vitamin B-12 (CYANOCOBALAMIN) 500 MCG tablet Take 500 mcg by mouth daily.    [provider]     Family History  Problem Relation Age of Onset   COPD Father    Heart attack Father 37   COPD Mother    Diabetes Paternal Grandmother    Parkinsonism Paternal Grandmother    Stroke Paternal Grandmother 69   Stroke Maternal Grandfather 37   Lung cancer Paternal Grandfather  Heavy smoker   Colon cancer Neg Hx    Prostate cancer Neg Hx    Esophageal cancer Neg Hx    Rectal cancer Neg Hx    Stomach cancer Neg Hx     Social History   Socioeconomic History   Marital status: Married    Spouse name: [name]   Number of children: 3   Years of education: Not on file   Highest education level: Not on file  Occupational History   Occupation: retired  Tobacco Use   Smoking status: Never   Smokeless tobacco: Never  Vaping Use   Vaping Use: Never used  Substance and Sexual Activity   Alcohol use: Not Currently    Comment: currently sober since 06/2020   Drug use: No   Sexual activity: Yes    Partners: Female    Comment: Having some issues with ED.  Would like provider's recommendation.    Other Topics Concern   Not on file  Social History Narrative   Lives with Wife, [name]   Right Handed   Drinks 4-5 cups caffeine/week   Social Determinants of Health   Financial Resource Strain: Not on file  Food Insecurity: Not on file  Transportation Needs: Not on file   Physical Activity: Not on file  Stress: Not on file  Social Connections: Not on file     Review of Systems: A 12 point ROS discussed and pertinent positives are indicated in the HPI above.  All other systems are negative.  Vital Signs: There were no vitals taken for this visit.  Physical Exam Vitals and nursing note reviewed.  Constitutional:      General: Patient is not in acute distress.    Appearance: Normal appearance. Patient is not ill-appearing.  HENT:     Head: Normocephalic and atraumatic.     Mouth/Throat:     Mouth: Mucous membranes are moist.     Pharynx: Oropharynx is clear.  Cardiovascular:     Rate and Rhythm: Normal rate and regular rhythm.     Pulses: Normal pulses.     Heart sounds: Normal heart sounds.  Pulmonary:     Effort: Pulmonary effort is normal.     Breath sounds: Normal breath sounds.  Abdominal:     General: Abdomen is flat. Bowel sounds are normal.     Palpations: Abdomen is soft.  Musculoskeletal:     Cervical back: Neck supple.  Skin:    General: Skin is warm and dry.     Coloration: Skin is not jaundiced or pale.  Neurological:     Mental Status: Patient is alert and oriented to person, place, and time.  Psychiatric:        Mood and Affect: Mood normal.        Behavior: Behavior normal.        Judgment: Judgment normal.     MD Evaluation Airway: WNL Heart: WNL Abdomen: WNL Chest/ Lungs: WNL ASA  Classification: 3 Mallampati/Airway Score: Two  Imaging: CT ABDOMEN PELVIS WO CONTRAST  Result Date: 02/04/2021 CLINICAL DATA:  Gallbladder removal surgery 1 week ago. Upper abdominal pain. EXAM: CT ABDOMEN AND PELVIS WITHOUT CONTRAST TECHNIQUE: Multidetector CT imaging of the abdomen and pelvis was performed following the standard protocol without IV contrast. COMPARISON:  01/02/2021 FINDINGS: Lower chest: Minimal bilateral pleural effusions with basilar atelectasis. Coronary artery calcifications. Hepatobiliary: No focal liver  lesions. Surgical absence of the gallbladder. There is a gas and fluid collection in the gallbladder fossa with an air-fluid  level. The collection measures 4.8 cm in diameter. In the recent postoperative setting, is likely to represent an abscess. Postoperative collection is also possible of the presence of gas favors infection or possibly fistula formation. Surgicel is unlikely to result in air-fluid level although correlation with surgical history is suggested. No bile duct dilatation Johna Roles E a mild inflammation in the gallbladder fossa and along the anterior pararenal fascia. Pancreas: Unremarkable. No pancreatic ductal dilatation or surrounding inflammatory changes. Spleen: Normal in size without focal abnormality. Adrenals/Urinary Tract: No adrenal gland nodules. Kidneys are symmetrical. A tiny stone is suggested in the lower pole of the right kidney. No hydronephrosis or hydroureter. Bladder wall is thickened, possibly cystitis or decompression. Stomach/Bowel: Stomach, small bowel, and colon are not abnormally distended. Contrast material flows through to the colon suggesting no evidence of obstruction. No wall thickening or inflammatory changes are appreciated. Appendix is normal. Vascular/Lymphatic: Aortic atherosclerosis. No enlarged abdominal or pelvic lymph nodes. Reproductive: Prostate is unremarkable. Other: No free air or free fluid in the abdomen. Infiltration along the anterior abdominal wall near the umbilicus and along the right lateral abdominal wall likely represents postoperative change. Musculoskeletal: Degenerative changes in the spine with degenerative disc disease at L5-S1. No destructive bone lesions. IMPRESSION: 1. Surgical absence of the gallbladder. Gas and fluid collection in the gallbladder fossa with air-fluid level and surrounding stranding. This is likely abscess. Less likely postoperative collection or fistula. 2. Nonobstructing stone in the right kidney. 3. Small bilateral pleural  effusions with basilar atelectasis. Electronically Signed   By: Lucienne Capers M.D.   On: 02/04/2021 20:01    Labs:  CBC: Recent Labs    08/21/20 1015 10/15/20 1121 11/27/20 1055 01/22/21 1015  WBC 10.7* 6.2 9.9 4.7  HGB 13.4 13.6 12.6* 14.1  HCT 38.0* 39.0 36.6* 39.9  PLT 213.0 195 221.0 145*    COAGS: Recent Labs    05/01/20 0007 01/22/21 1015  INR 1.0 1.0    BMP: Recent Labs    05/15/20 0422 05/16/20 0740 05/22/20 1210 08/21/20 1015 10/15/20 1121 11/27/20 1055 01/22/21 1015  NA 133* 139   < > 140 139 136 140  K 4.0 3.1*   < > 4.3 4.9 5.0 4.6  CL 103 106   < > 105 103 101 108  CO2 15* 21*   < > 27 29 27 25   GLUCOSE 104* 87   < > 103* 109* 106* 107*  BUN 16 14   < > 15 19 15 12   CALCIUM 9.2 8.6*   < > 9.4 10.1 9.5 9.8  CREATININE 1.09 1.17   < > 1.20 1.28* 1.18 1.25*  GFRNONAA >60 >60  --   --  59*  --  >60   < > = values in this interval not displayed.    LIVER FUNCTION TESTS: Recent Labs    05/22/20 1210 10/15/20 1121 11/27/20 1055 01/22/21 1015  BILITOT 0.7 0.8 4.7* 1.4*  AST 16 24 56* 26  ALT 15 33 58* 21  ALKPHOS 52 102 260* 61  PROT 5.9* 6.4* 6.0 6.2*  ALBUMIN 3.9 4.2 3.8 3.7    TUMOR MARKERS: No results for input(s): AFPTM, CEA, CA199, CHROMGRNA in the last 8760 hours.  Assessment and Plan: 70 y.o. male s/p cholecystectomy on 01/29/2021 who underwent CT abdomen pelvis without contrast on 02/04/2021 due to upper abdominal pain which showed gas and fluid collection in the gallbladder fossa with air-fluid level and surrounding stranding.  IR was requested for image guided  aspiration and drain placement for the fluid collection within the gallbladder fossa. Case was reviewed and approved by Dr. Earleen Newport.   NPO since MN VSS Recent labs on 01/22/21 showed:  CBC no leukocytosis, mild thrombocytopenia 145  INR 1.0, on ASA 81 mg  CMP stable   Risks and benefits discussed with the patient including bleeding, infection, damage to adjacent  structures, bowel perforation/fistula connection, and sepsis.  All of the patient's questions were answered, patient is agreeable to proceed. Consent signed and in chart.    Thank you for this interesting consult.  I greatly enjoyed meeting Jeffrey Acosta and look forward to participating in their care.  A copy of this report was sent to the requesting provider on this date.  Electronically Signed: Tera Mater, PA-C 02/05/2021, 1:48 PM   I spent a total of  30 Minutes   in face to face in clinical consultation, greater than 50% of which was counseling/coordinating care for aspiration and possible drain placement for gallbladder fossa fluid collection   This chart was dictated using voice recognition software.  Despite best efforts to proofread,  errors can occur which can change the documentation meaning.

## 2021-02-06 ENCOUNTER — Other Ambulatory Visit: Payer: Self-pay | Admitting: General Surgery

## 2021-02-06 DIAGNOSIS — K651 Peritoneal abscess: Secondary | ICD-10-CM

## 2021-02-07 ENCOUNTER — Ambulatory Visit (INDEPENDENT_AMBULATORY_CARE_PROVIDER_SITE_OTHER): Payer: Medicare Other

## 2021-02-07 DIAGNOSIS — I639 Cerebral infarction, unspecified: Secondary | ICD-10-CM | POA: Diagnosis not present

## 2021-02-10 LAB — AEROBIC/ANAEROBIC CULTURE W GRAM STAIN (SURGICAL/DEEP WOUND)

## 2021-02-11 LAB — CUP PACEART REMOTE DEVICE CHECK
Date Time Interrogation Session: 20221014142353
Implantable Pulse Generator Implant Date: 20220502

## 2021-02-13 ENCOUNTER — Ambulatory Visit
Admission: RE | Admit: 2021-02-13 | Discharge: 2021-02-13 | Disposition: A | Discharge status: Home / Self Care | Payer: Medicare Other | Source: Ambulatory Visit | Attending: General Surgery | Admitting: General Surgery

## 2021-02-13 ENCOUNTER — Ambulatory Visit: Payer: Medicare Other | Admitting: Neurology

## 2021-02-13 DIAGNOSIS — K449 Diaphragmatic hernia without obstruction or gangrene: Secondary | ICD-10-CM | POA: Diagnosis not present

## 2021-02-13 DIAGNOSIS — K651 Peritoneal abscess: Secondary | ICD-10-CM

## 2021-02-13 DIAGNOSIS — Z4682 Encounter for fitting and adjustment of non-vascular catheter: Secondary | ICD-10-CM | POA: Diagnosis not present

## 2021-02-13 HISTORY — PX: IR RADIOLOGIST EVAL & MGMT: IMG5224

## 2021-02-13 NOTE — Progress Notes (Signed)
Chief Complaint: Patient was seen in consultation today for gallbladder fossa drain follow-up at the request of [facility]  Referring Physician(s): [facility]  Supervising Physician: Corrie Mckusick  History of Present Illness: Jeffrey Acosta is a 70 y.o. male PMH of bipolar, diverticulosis, GERD, migraines, hyperlipidemia, hypothyroidism, osteopenia, CVA and subarachnoid hemorrhage.  Patient had cholecystectomy 01/29/2021.  Follow-up CT on 02/04/21 showed gas and fluid collection in gallbladder fossa with air fluid level surrounding and stranding.  At that time request was made for image guided aspiration and drain placement for fluid collection. Patient had drain placement on 10/12/202. Patient here today for follow-up CT and drain injection. Pt states he took last antibiotic today. Patient reports he has been flushing the drain with 10 cc normal saline daily with 8 to 10 cc output daily.  Patient will follow-up with Dr. Donne Hazel on 02/21/2021.  Nonpurulent output noted in JP drain.  Past Medical History:  Diagnosis Date   Alcoholism in remission ([place])    in AA   Arrhythmia    atrial tachycardia   Bipolar affective disorder ([place])    Diverticulosis of colon (without mention of hemorrhage)    GERD (gastroesophageal reflux disease)    Gout of multiple sites    Hiatal hernia    Hx of migraines    Hydrocele    HYPERLIPIDEMIA 01/10/2009   Qualifier: Diagnosis of  By: Ronnald Ramp CMA, [place]    NMR Lipoprofile 2011 LDL could not be calculated  Due to TG of 889  (811  / 750 ),  HDL 50 . Father MI @ 32 PGM CVA early 98s; PGF CVA @72     Hypothyroidism    Melanoma of skin, site unspecified 11/25/2012   07/2011 abdominal  Stage 1 Dr Deskin Lacy [place] Dr Sarajane Jews    Osteopenia    Polyarthralgia    Status post placement of implantable loop recorder 08/26/2020   Stroke [facility])    Subarachnoid hemorrhage [facility])     Past Surgical History:  Procedure Laterality Date   BILIARY BRUSHING   12/07/2018   Procedure: BILIARY BRUSHING;  Surgeon: Irving Copas., MD;  Location: [facility];  Service: Gastroenterology;;   BILIARY DILATION  12/07/2018   Procedure: BILIARY DILATION;  Surgeon: Irving Copas., MD;  Location: [facility];  Service: Gastroenterology;;   BIOPSY  12/07/2018   Procedure: BIOPSY;  Surgeon: Irving Copas., MD;  Location: [facility];  Service: Gastroenterology;;   CHOLECYSTECTOMY N/A 01/29/2021   Procedure: LAPAROSCOPIC CHOLECYSTECTOMY WITH ICG DYE;  Surgeon: Rolm Bookbinder, MD;  Location: [place];  Service: General;  Laterality: N/A;   COLONOSCOPY  2003   negative ; Dr Sharlett Iles   ERCP N/A 12/07/2018   Procedure: ENDOSCOPIC RETROGRADE CHOLANGIOPANCREATOGRAPHY (ERCP);  Surgeon: Irving Copas., MD;  Location: [facility];  Service: Gastroenterology;  Laterality: N/A;   ESOPHAGOGASTRODUODENOSCOPY (EGD) WITH PROPOFOL N/A 12/07/2018   Procedure: ESOPHAGOGASTRODUODENOSCOPY (EGD) WITH PROPOFOL;  Surgeon: Rush Landmark Telford Nab., MD;  Location: [facility];  Service: Gastroenterology;  Laterality: N/A;   EUS N/A 12/07/2018   Procedure: UPPER ENDOSCOPIC ULTRASOUND (EUS) RADIAL;  Surgeon: Irving Copas., MD;  Location: [facility];  Service: Gastroenterology;  Laterality: N/A;   INGUINAL HERNIA REPAIR  08/07/2011   Procedure: HERNIA REPAIR INGUINAL ADULT;  Surgeon: Rolm Bookbinder, MD;  Location: [place];  Service: General;  Laterality: Right;   IR ANGIO INTRA EXTRACRAN SEL INTERNAL CAROTID BILAT MOD SED  05/14/2020   IR ANGIO VERTEBRAL SEL VERTEBRAL BILAT MOD SED  05/14/2020  IR GUIDED DRAIN W CATHETER PLACEMENT  02/05/2021   LOOP RECORDER INSERTION     06/2020   MELANOMA EXCISION  07/28/11   Dr Sarajane Jews; stomach and chest   REMOVAL OF STONES  12/07/2018   Procedure: REMOVAL OF STONES;  Surgeon: Rush Landmark Telford Nab., MD;  Location: [facility];  Service: Gastroenterology;;   TONSILLECTOMY  1951    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 Authorizing Provider  Acetaminophen-Codeine 300-30 MG tablet Take 1 tablet by mouth every 6 (six) hours as needed for pain. 05/04/20   [provider]  allopurinol (ZYLOPRIM) 300 MG tablet Take 1 tablet (300 mg total) by mouth daily. 08/12/20   Colon Branch, MD  amoxicillin (AMOXIL) 875 MG tablet Take 875 mg by mouth 2 (two) times daily.    [provider]  aspirin EC 81 MG tablet Take 81 mg by mouth daily. Swallow whole.    [provider]  b complex vitamins capsule Take 1 capsule by mouth daily.    [provider]  Cholecalciferol (VITAMIN D) 50 MCG (2000 UT) tablet Take 2,000 Units by mouth daily.    [provider]  dicyclomine (BENTYL) 20 MG tablet TAKE 1 TABLET(20 MG) BY MOUTH THREE TIMES DAILY AS NEEDED FOR SPASMS 12/27/20   Colon Branch, MD  esomeprazole (NEXIUM) 40 MG capsule TAKE 1 CAPSULE(40 MG) BY MOUTH DAILY BEFORE BREAKFAST 01/24/21   Colon Branch, MD  fluticasone [facility]) 50 MCG/ACT nasal spray Place 2 sprays into both nostrils daily as needed for congestion. 09/25/20   [provider]  folic acid (FOLVITE) 1 MG tablet Take 1 tablet (1 mg total) by mouth daily. 06/12/20   Colon Branch, MD  lamoTRIgine (LAMICTAL) 25 MG tablet Take 25 mg by mouth daily. 12/30/19   [provider]  levothyroxine (SYNTHROID) 50 MCG tablet TAKE 1 TABLET(50 MCG) BY MOUTH DAILY BEFORE BREAKFAST 01/24/21   Colon Branch, MD  metoprolol succinate (TOPROL-XL) 100 MG 24 hr tablet TAKE 1 AND 1/2 TABLETS(150 MG) BY MOUTH DAILY WITH OR IMMEDIATELY FOLLOWING A MEAL 01/24/21   Paz, Alda Berthold, MD  naltrexone (DEPADE) 50 MG tablet Take 50 mg by mouth daily.  01/15/16   [provider]  oxyCODONE (OXY IR/ROXICODONE) 5  MG immediate release tablet Take 1 tablet (5 mg total) by mouth every 6 (six) hours as needed for moderate pain, severe pain or breakthrough pain. 01/29/21   Rolm Bookbinder, MD  QUEtiapine (SEROQUEL) 400 MG tablet Take 800 mg by mouth at bedtime.    [provider]  thiamine 100 MG tablet TAKE 1 TABLET (100 MG TOTAL) BY MOUTH DAILY. 05/16/20 05/16/21  Florencia Reasons, MD  Thiamine HCl (VITAMIN B-1) 250 MG tablet Take 1 tablet (250 mg total) by mouth daily. 06/12/20   Colon Branch, MD  timolol (TIMOPTIC) 0.5 % ophthalmic solution Place 1 drop into both eyes 2 (two) times daily. 03/17/19   [provider]  vitamin B-12 (CYANOCOBALAMIN) 500 MCG tablet Take 500 mcg by mouth daily.    [provider]     Family History  Problem Relation Age of Onset   COPD Father    Heart attack Father 59   COPD Mother    Diabetes Paternal Grandmother  Parkinsonism Paternal Grandmother    Stroke Paternal Grandmother 45   Stroke Maternal Grandfather 70   Lung cancer Paternal Grandfather        Heavy smoker   Colon cancer Neg Hx    Prostate cancer Neg Hx    Esophageal cancer Neg Hx    Rectal cancer Neg Hx    Stomach cancer Neg Hx     Social History   Socioeconomic History   Marital status: Married    Spouse name: [name]   Number of children: 3   Years of education: Not on file   Highest education level: Not on file  Occupational History   Occupation: retired  Tobacco Use   Smoking status: Never   Smokeless tobacco: Never  Vaping Use   Vaping Use: Never used  Substance and Sexual Activity   Alcohol use: Not Currently    Comment: currently sober since 06/2020   Drug use: No   Sexual activity: Yes    Partners: Female    Comment: Having some issues with ED.  Would like provider's recommendation.    Other Topics Concern   Not on file  Social History Narrative   Lives with Wife, [name]   Right Handed   Drinks 4-5 cups caffeine/week   Social Determinants of Health   Financial  Resource Strain: Not on file  Food Insecurity: Not on file  Transportation Needs: Not on file  Physical Activity: Not on file  Stress: Not on file  Social Connections: Not on file      Review of Systems: A 12 point ROS discussed and pertinent positives are indicated in the HPI above.  All other systems are negative.  Review of Systems  Constitutional:  Negative for chills and fever.  Gastrointestinal:  Negative for nausea and vomiting.       Pt c/o mild discomfort to site with coughing and bending over    Vital Signs: There were no vitals taken for this visit.  Physical Exam Constitutional:      Appearance: Normal appearance.  HENT:     Head: Normocephalic and atraumatic.  Pulmonary:     Effort: Pulmonary effort is normal.  Abdominal:     Palpations: Abdomen is soft.     Comments: RUQ drain in place. Insertion site has mild redness with no drainage noted. Sutures and Statlock in place. Non-purulent OP in JP drain.   Skin:    General: Skin is warm and dry.  Neurological:     Mental Status: He is alert and oriented to person, place, and time.  Psychiatric:        Mood and Affect: Mood normal.        Behavior: Behavior normal.        Thought Content: Thought content normal.        Judgment: Judgment normal.    Mallampati Score:     Imaging: CT ABDOMEN PELVIS WO CONTRAST  Result Date: 02/04/2021 CLINICAL DATA:  Gallbladder removal surgery 1 week ago. Upper abdominal pain. EXAM: CT ABDOMEN AND PELVIS WITHOUT CONTRAST TECHNIQUE: Multidetector CT imaging of the abdomen and pelvis was performed following the standard protocol without IV contrast. COMPARISON:  01/02/2021 FINDINGS: Lower chest: Minimal bilateral pleural effusions with basilar atelectasis. Coronary artery calcifications. Hepatobiliary: No focal liver lesions. Surgical absence of the gallbladder. There is a gas and fluid collection in the gallbladder fossa with an air-fluid level. The collection measures 4.8 cm  in diameter. In the recent postoperative setting, is likely to represent an  abscess. Postoperative collection is also possible of the presence of gas favors infection or possibly fistula formation. Surgicel is unlikely to result in air-fluid level although correlation with surgical history is suggested. No bile duct dilatation Johna Roles E a mild inflammation in the gallbladder fossa and along the anterior pararenal fascia. Pancreas: Unremarkable. No pancreatic ductal dilatation or surrounding inflammatory changes. Spleen: Normal in size without focal abnormality. Adrenals/Urinary Tract: No adrenal gland nodules. Kidneys are symmetrical. A tiny stone is suggested in the lower pole of the right kidney. No hydronephrosis or hydroureter. Bladder wall is thickened, possibly cystitis or decompression. Stomach/Bowel: Stomach, small bowel, and colon are not abnormally distended. Contrast material flows through to the colon suggesting no evidence of obstruction. No wall thickening or inflammatory changes are appreciated. Appendix is normal. Vascular/Lymphatic: Aortic atherosclerosis. No enlarged abdominal or pelvic lymph nodes. Reproductive: Prostate is unremarkable. Other: No free air or free fluid in the abdomen. Infiltration along the anterior abdominal wall near the umbilicus and along the right lateral abdominal wall likely represents postoperative change. Musculoskeletal: Degenerative changes in the spine with degenerative disc disease at L5-S1. No destructive bone lesions. IMPRESSION: 1. Surgical absence of the gallbladder. Gas and fluid collection in the gallbladder fossa with air-fluid level and surrounding stranding. This is likely abscess. Less likely postoperative collection or fistula. 2. Nonobstructing stone in the right kidney. 3. Small bilateral pleural effusions with basilar atelectasis. Electronically Signed   By: Lucienne Capers M.D.   On: 02/04/2021 20:01   IR Guided Drain W Catheter Placement  Result  Date: 02/05/2021 INDICATION: 70 year old male with a history of postoperative gallbladder fossa fluid collection, concern for abscess presents for drainage EXAM: IMAGE GUIDED DRAINAGE OF GALLBLADDER FOSSA FLUID COLLECTION MEDICATIONS: The patient is currently admitted to the hospital and receiving intravenous antibiotics. The antibiotics were administered within an appropriate time frame prior to the initiation of the procedure. ANESTHESIA/SEDATION: Fentanyl 25 mcg IV; Versed 1.0 mg IV Moderate Sedation Time:  11 minute The patient was continuously monitored during the procedure by the interventional radiology nurse under my direct supervision. COMPLICATIONS: None PROCEDURE: Informed written consent was obtained from the patient after a thorough discussion of the procedural risks, benefits and alternatives. All questions were addressed. Maximal Sterile Barrier Technique was utilized including caps, mask, sterile gowns, sterile gloves, sterile drape, hand hygiene and skin antiseptic. A timeout was performed prior to the initiation of the procedure. Patient was positioned supine position on the table. Ultrasound was performed with images stored sent to PACs. 1% lidocaine was used for local anesthesia. Small stab incision was made with 11 blade scalpel. Under ultrasound guidance, Yueh needle was advanced in inter costal location into the gallbladder fossa collection. Once we confirmed the catheter position with aspiration of fluid, modified Seldinger technique was used to place a 10 [place] drain. Drain was formed adjacent to the metal clips. Further purulent material was aspirated. Culture was sent. Drain was sutured in position and attached to bulb suction. Patient tolerated the procedure well and remained hemodynamically stable throughout. No complications were encountered and no significant blood loss. IMPRESSION: Status post image guided drainage of gallbladder fossa abscess. Signed, Dulcy Fanny. Dellia Nims, RPVI  [facility] Radiology Electronically Signed   By: Corrie Mckusick D.O.   On: 02/05/2021 14:38   CUP PACEART REMOTE DEVICE CHECK  Result Date: 02/11/2021 ILR summary report received. Battery status OK. Normal device function. No new symptom, tachy, brady, or pause episodes. No new AF episodes. Monthly  summary reports and ROV/PRN 6 false AF episodes showing SR-ST with ectopy, duration 2-5min LR   Labs:  CBC: Recent Labs    08/21/20 1015 10/15/20 1121 11/27/20 1055 01/22/21 1015  WBC 10.7* 6.2 9.9 4.7  HGB 13.4 13.6 12.6* 14.1  HCT 38.0* 39.0 36.6* 39.9  PLT 213.0 195 221.0 145*    COAGS: Recent Labs    05/01/20 0007 01/22/21 1015  INR 1.0 1.0    BMP: Recent Labs    05/15/20 0422 05/16/20 0740 05/22/20 1210 08/21/20 1015 10/15/20 1121 11/27/20 1055 01/22/21 1015  NA 133* 139   < > 140 139 136 140  K 4.0 3.1*   < > 4.3 4.9 5.0 4.6  CL 103 106   < > 105 103 101 108  CO2 15* 21*   < > 27 29 27 25   GLUCOSE 104* 87   < > 103* 109* 106* 107*  BUN 16 14   < > 15 19 15 12   CALCIUM 9.2 8.6*   < > 9.4 10.1 9.5 9.8  CREATININE 1.09 1.17   < > 1.20 1.28* 1.18 1.25*  GFRNONAA >60 >60  --   --  59*  --  >60   < > = values in this interval not displayed.    LIVER FUNCTION TESTS: Recent Labs    05/22/20 1210 10/15/20 1121 11/27/20 1055 01/22/21 1015  BILITOT 0.7 0.8 4.7* 1.4*  AST 16 24 56* 26  ALT 15 33 58* 21  ALKPHOS 52 102 260* 61  PROT 5.9* 6.4* 6.0 6.2*  ALBUMIN 3.9 4.2 3.8 3.7    TUMOR MARKERS: No results for input(s): AFPTM, CEA, CA199, CHROMGRNA in the last 8760 hours.  Assessment:  70 year old male status post cholecystectomy 12/28/8180 complicated by gallbladder fossa fluid collection, s/p gallbladder fossa drain placed by IR 02/05/2021.  Patient here today for follow-up. CT and drain injection reviewed by Dr. Earleen Newport who recommends drain removal.  Right upper quadrant gallbladder fossa drain removed  intact, no complications,  patient tolerated procedure well, dressing applied to exit site.   Post-removal instructions: 1- Okay  to shower/sponge bath 24 hours post-removal. 2- No submerging (swimming, bathing) for 7 days post-removal. 3- Change dressing PRN until site fully healed.   Electronically Signed: Tyson Alias PA-C 02/13/2021, 1:02 PM   Please refer to Dr. Pasty Arch attestation of this note for management and plan.

## 2021-02-14 NOTE — Progress Notes (Signed)
Carelink Summary Report / Loop Recorder 

## 2021-03-03 ENCOUNTER — Telehealth: Payer: Self-pay

## 2021-03-03 NOTE — Telephone Encounter (Signed)
ILR alert report received. Battery status OK. Normal device function. No new symptom, tachy, brady, or pause episodes. Two new AF episodes, both 2 minutes each, ? AF vs SR/ST with ectopy. ILR implanted for CVA 08/26/2020.  Please review Dr. Sallyanne Kuster for review and recommendations.

## 2021-03-12 ENCOUNTER — Ambulatory Visit (INDEPENDENT_AMBULATORY_CARE_PROVIDER_SITE_OTHER): Payer: Medicare Other

## 2021-03-12 DIAGNOSIS — I639 Cerebral infarction, unspecified: Secondary | ICD-10-CM | POA: Diagnosis not present

## 2021-03-13 LAB — CUP PACEART REMOTE DEVICE CHECK
Date Time Interrogation Session: 20221116142349
Implantable Pulse Generator Implant Date: 20220502

## 2021-03-14 ENCOUNTER — Other Ambulatory Visit: Payer: Self-pay

## 2021-03-14 ENCOUNTER — Emergency Department (HOSPITAL_BASED_OUTPATIENT_CLINIC_OR_DEPARTMENT_OTHER)
Admission: EM | Admit: 2021-03-14 | Discharge: 2021-03-14 | Disposition: A | Discharge status: Home / Self Care | Payer: Medicare Other | Attending: Emergency Medicine | Admitting: Emergency Medicine

## 2021-03-14 ENCOUNTER — Emergency Department (HOSPITAL_BASED_OUTPATIENT_CLINIC_OR_DEPARTMENT_OTHER): Payer: Medicare Other

## 2021-03-14 ENCOUNTER — Encounter (HOSPITAL_BASED_OUTPATIENT_CLINIC_OR_DEPARTMENT_OTHER): Payer: Self-pay | Admitting: *Deleted

## 2021-03-14 ENCOUNTER — Telehealth: Payer: Self-pay | Admitting: Internal Medicine

## 2021-03-14 DIAGNOSIS — M79644 Pain in right finger(s): Secondary | ICD-10-CM | POA: Diagnosis not present

## 2021-03-14 DIAGNOSIS — S81811A Laceration without foreign body, right lower leg, initial encounter: Secondary | ICD-10-CM | POA: Diagnosis not present

## 2021-03-14 DIAGNOSIS — W108XXA Fall (on) (from) other stairs and steps, initial encounter: Secondary | ICD-10-CM | POA: Insufficient documentation

## 2021-03-14 DIAGNOSIS — E039 Hypothyroidism, unspecified: Secondary | ICD-10-CM | POA: Diagnosis not present

## 2021-03-14 DIAGNOSIS — S8991XA Unspecified injury of right lower leg, initial encounter: Secondary | ICD-10-CM | POA: Diagnosis present

## 2021-03-14 DIAGNOSIS — W19XXXA Unspecified fall, initial encounter: Secondary | ICD-10-CM

## 2021-03-14 DIAGNOSIS — Z7982 Long term (current) use of aspirin: Secondary | ICD-10-CM | POA: Insufficient documentation

## 2021-03-14 DIAGNOSIS — I6200 Nontraumatic subdural hemorrhage, unspecified: Secondary | ICD-10-CM | POA: Diagnosis not present

## 2021-03-14 DIAGNOSIS — Z8673 Personal history of transient ischemic attack (TIA), and cerebral infarction without residual deficits: Secondary | ICD-10-CM | POA: Diagnosis not present

## 2021-03-14 DIAGNOSIS — Z79899 Other long term (current) drug therapy: Secondary | ICD-10-CM | POA: Insufficient documentation

## 2021-03-14 DIAGNOSIS — Z043 Encounter for examination and observation following other accident: Secondary | ICD-10-CM | POA: Diagnosis not present

## 2021-03-14 DIAGNOSIS — S81812A Laceration without foreign body, left lower leg, initial encounter: Secondary | ICD-10-CM | POA: Diagnosis not present

## 2021-03-14 DIAGNOSIS — S82401A Unspecified fracture of shaft of right fibula, initial encounter for closed fracture: Secondary | ICD-10-CM | POA: Diagnosis not present

## 2021-03-14 DIAGNOSIS — M79604 Pain in right leg: Secondary | ICD-10-CM | POA: Diagnosis not present

## 2021-03-14 MED ORDER — CEPHALEXIN 500 MG PO CAPS: 500.0000 mg | ORAL_CAPSULE | Freq: Four times a day (QID) | ORAL | 0 refills | Status: AC

## 2021-03-14 MED ORDER — CEFAZOLIN SODIUM-DEXTROSE 2-4 GM/100ML-% IV SOLN
2.0000 g | Freq: Once | INTRAVENOUS | Status: AC
  Administered 2021-03-14: 2 g via INTRAVENOUS
  Filled 2021-03-14: qty 100

## 2021-03-14 MED ORDER — LIDOCAINE-EPINEPHRINE (PF) 2 %-1:200000 IJ SOLN
20.0000 mL | Freq: Once | INTRAMUSCULAR | Status: AC
  Administered 2021-03-14: 20 mL via INTRADERMAL
  Filled 2021-03-14: qty 20

## 2021-03-14 NOTE — Telephone Encounter (Signed)
LMOM asking for Pt to call back to schedule a follow-up in the next several days at his earliest convenience.

## 2021-03-14 NOTE — Telephone Encounter (Signed)
Please call patient, advised that I am requesting office visit, I am just concerned about his health, virtual visit would be okay.

## 2021-03-14 NOTE — Telephone Encounter (Signed)
Noted  

## 2021-03-14 NOTE — Telephone Encounter (Signed)
Pt sister called in and wanted to give a truthful update regarding brother. She understands information is not going to be able to be disclosed to her but she just wanted to tell Dr.Paz what's going on and see if there is anyway he can help.  Her brothers drinking has gotten out of control. It is to the point where she is scared he is going to die or kill himself because of it. She states he drinks so bad that he has had multiple strokes, and has fallen multiple times down the stairs because he is so intoxicated. He even escaped the hospital and was found 5hrs later with hypothermia. He states he goes to [place] but he really doesn't go. His wife is handling serious family matters and has gotten to the point where she doesn't take care of him and is on the verge of kicking him out because of his drunkness. The sister is very scared for her brother and doesn't know what to do. She mentioned she would rather have him placed in a facility where she knows he cant gain access to alcohol just so he can get better and even live. He was seeing a sponsor but he got rid of him. At one point his family was aware of what triggered the alcohol use, but now its to the point where there is no trigger, he just drinks. She stated if there needs to be any clarification on the message above or any other questions, she can be contacted. She just wants whats best for her brother and to see him live!  Irish Elders: [phone]

## 2021-03-14 NOTE — Telephone Encounter (Signed)
FYI. Pt currently in ED for fall down approximately 14 steps last night.

## 2021-03-14 NOTE — ED Provider Notes (Signed)
[facility] Provider Note   CSN: [id number] Arrival date & time: 03/14/21  1156     History Chief Complaint  Patient presents with   [facility]    Jeffrey Acosta is a 70 y.o. male.  HPI  70 year old male with history of alcoholism in remission, arrhythmia, bipolar affective disorder, diverticulosis, GERD, gout, hiatal hernia, hydrocele, hyperlipidemia, hypothyroidism, melanoma, osteopenia, polyarthralgia, CVA, who presents the emergency department today for evaluation of a fall that occurred yesterday.  Patient states that he fell down about 15 stairs yesterday.  He fell forward all the way to the bottom of the stairs.  He thinks he hit his right leg on a piece of furniture but otherwise denies any head trauma that he can remember.  Denies LOC.  Denies any neck or back pain or any other injuries at this time.  He is not anticoagulated.  Past Medical History:  Diagnosis Date   Alcoholism in remission ([facility])    in AA   Arrhythmia    atrial tachycardia   Bipolar affective disorder ([facility])    Diverticulosis of colon (without mention of hemorrhage)    GERD (gastroesophageal reflux disease)    Gout of multiple sites    Hiatal hernia    Hx of migraines    Hydrocele    HYPERLIPIDEMIA 01/10/2009   Qualifier: Diagnosis of  By: [facility], [facility]    NMR Lipoprofile 2011 LDL could not be calculated  Due to TG of 889  (811  / 750 ),  HDL 50 . Father MI @ 33 PGM CVA early 30s; PGF CVA @72     Hypothyroidism    Melanoma of skin, site unspecified 11/25/2012   07/2011 abdominal  Stage 1 Dr [facility] Dr [facility]    Osteopenia    Polyarthralgia    Status post placement of implantable loop recorder 08/26/2020   Stroke [facility])    Subarachnoid hemorrhage [facility])     Patient Active Problem List   Diagnosis Date Noted   Dyslipidemia 05/23/2020   Benign essential HTN 05/06/2020   Subarachnoid hemorrhage ([facility]) 04/30/2020   Leukopenia 07/15/2018   Thrombocytopenia ([place])  07/15/2018   PCP NOTES >>>>>>> 03/23/2015   Annual physical exam 03/20/2015   Bronchospasm 09/06/2014   DJD (degenerative joint disease) 09/06/2014   Hypothyroidism 08/28/2014   Tremor 07/29/2014   Gait disorder 07/29/2014   Gout 01/08/2014   Vitamin D deficiency 11/25/2012   Melanoma of skin, site unspecified 11/25/2012   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[place]) 11/25/2012   Alcoholism in remission ([facility]) 06/19/2011   Diverticulitis of colon (without mention of hemorrhage)(562.11) 06/19/2011   Bipolar I disorder ([facility]) 06/19/2011   Osteopenia 05/17/2010   Alcohol abuse 05/16/2010   HYPERTRIGLYCERIDEMIA 08/02/2009   Macrocytic anemia 01/10/2009   ABNORMAL ELECTROCARDIOGRAM 11/26/2008   REFLUX, ESOPHAGEAL 11/17/2006   COMMON MIGRAINE 09/22/2006    Past Surgical History:  Procedure Laterality Date   BILIARY BRUSHING  12/07/2018   Procedure: BILIARY BRUSHING;  Surgeon: Jeffrey Copas., [facility];  [facility];  Service: Gastroenterology;;   BILIARY DILATION  12/07/2018   Procedure: BILIARY DILATION;  Surgeon: Jeffrey Copas., [facility];  [facility]: [facility];  Service: Gastroenterology;;   BIOPSY  12/07/2018   Procedure: BIOPSY;  Surgeon: Jeffrey Copas., [facility];  [facility];  Service: Gastroenterology;;   CHOLECYSTECTOMY N/A 01/29/2021   Procedure: LAPAROSCOPIC CHOLECYSTECTOMY WITH ICG DYE;  Surgeon: [facility];  Location: [facility];  Service: General;  Laterality: N/A;   COLONOSCOPY  2003  negative ; Dr [facility]   ERCP N/A 12/07/2018   Procedure: ENDOSCOPIC RETROGRADE CHOLANGIOPANCREATOGRAPHY (ERCP);  Surgeon: Jeffrey Copas., [facility];  [facility]: [facility];  Service: Gastroenterology;  Laterality: N/A;   ESOPHAGOGASTRODUODENOSCOPY (EGD) WITH PROPOFOL N/A 12/07/2018   Procedure: ESOPHAGOGASTRODUODENOSCOPY (EGD) WITH PROPOFOL;  Surgeon: Jeffrey Landmark Telford Nab., [facility];  Location: [facility];  Service: Gastroenterology;  Laterality: N/A;   EUS N/A  12/07/2018   Procedure: UPPER ENDOSCOPIC ULTRASOUND (EUS) RADIAL;  Surgeon: Jeffrey Copas., [facility];  Location: [facility];  Service: Gastroenterology;  Laterality: N/A;   INGUINAL HERNIA REPAIR  08/07/2011   Procedure: HERNIA REPAIR INGUINAL ADULT;  Surgeon: [facility];  Location: [facility];  Service: General;  Laterality: Right;   IR ANGIO INTRA EXTRACRAN SEL INTERNAL CAROTID BILAT MOD SED  05/14/2020   IR ANGIO VERTEBRAL SEL VERTEBRAL BILAT MOD SED  05/14/2020   IR GUIDED DRAIN W CATHETER PLACEMENT  02/05/2021   IR RADIOLOGIST EVAL & MGMT  02/13/2021   LOOP RECORDER INSERTION     06/2020   MELANOMA EXCISION  07/28/11   Dr [facility]; stomach and chest   REMOVAL OF STONES  12/07/2018   Procedure: REMOVAL OF STONES;  Surgeon: Jeffrey Copas., [facility];  Location: [facility];  Service: Gastroenterology;;   TONSILLECTOMY  1951   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   UPPER GASTROINTESTINAL [facility]  1983   hiatal hernia   VASECTOMY  1979       Family History  Problem Relation Age of Onset   COPD Father    Heart attack Father 48   COPD Mother    Diabetes Paternal Grandmother    Parkinsonism Paternal Grandmother    Stroke Paternal Grandmother 35   Stroke Maternal Grandfather 72   Lung cancer Paternal Grandfather        Heavy smoker   Colon cancer Neg Hx    Prostate cancer Neg Hx    Esophageal cancer Neg Hx    Rectal cancer Neg Hx    Stomach cancer Neg Hx     Social History   Tobacco Use   Smoking status: Never   Smokeless tobacco: Never  Vaping Use   Vaping Use: Never used  Substance Use Topics   Alcohol use: Not Currently    Comment: currently sober since 06/2020   Drug use: No    Home Medications Prior to Admission medications   Medication Sig Start Date End Date Taking? Authorizing Provider  cephALEXin (KEFLEX) 500 MG capsule Take 1 capsule (500 mg total) by mouth 4 (four) times daily for 7 days. 03/14/21 03/21/21 Yes  Nidia Grogan S, PA-C  Acetaminophen-Codeine 300-30 MG tablet Take 1 tablet by mouth every 6 (six) hours as needed for pain. 05/04/20   Provider, Historical, [facility]  allopurinol (ZYLOPRIM) 300 MG tablet Take 1 tablet (300 mg total) by mouth daily. 08/12/20   Colon Branch, [facility]  amoxicillin (AMOXIL) 875 MG tablet Take 875 mg by mouth 2 (two) times daily.    Provider, Historical, [facility]  aspirin EC 81 MG tablet Take 81 mg by mouth daily. Swallow whole.    Provider, Historical, [facility]  b complex vitamins capsule Take 1 capsule by mouth daily.    Provider, Historical, [facility]  Cholecalciferol (VITAMIN D) 50 MCG (2000 UT) tablet Take 2,000 Units by mouth daily.    Provider, Historical, [facility]  dicyclomine (BENTYL) 20 MG tablet TAKE 1 TABLET(20 MG) BY MOUTH THREE TIMES DAILY AS NEEDED FOR SPASMS 12/27/20  Kathlene November E, [facility]  esomeprazole (NEXIUM) 40 MG capsule TAKE 1 CAPSULE(40 MG) BY MOUTH DAILY BEFORE BREAKFAST 01/24/21   Colon Branch, [facility]  fluticasone [facility]) 50 MCG/ACT nasal spray Place 2 sprays into both nostrils daily as needed for congestion. 09/25/20   Provider, Historical, [facility]  folic acid (FOLVITE) 1 MG tablet Take 1 tablet (1 mg total) by mouth daily. 06/12/20   Colon Branch, [facility]  lamoTRIgine (LAMICTAL) 25 MG tablet Take 25 mg by mouth daily. 12/30/19   Provider, Historical, [facility]  levothyroxine (SYNTHROID) 50 MCG tablet TAKE 1 TABLET(50 MCG) BY MOUTH DAILY BEFORE BREAKFAST 01/24/21   Colon Branch, [facility]  metoprolol succinate (TOPROL-XL) 100 MG 24 hr tablet TAKE 1 AND 1/2 TABLETS(150 MG) BY MOUTH DAILY WITH OR IMMEDIATELY FOLLOWING A MEAL 01/24/21   Paz, Alda Berthold, [facility]  naltrexone (DEPADE) 50 MG tablet Take 50 mg by mouth daily.  01/15/16   Provider, Historical, [facility]  oxyCODONE (OXY IR/ROXICODONE) 5 MG immediate release tablet Take 1 tablet (5 mg total) by mouth every 6 (six) hours as needed for moderate pain, severe pain or breakthrough pain. 01/29/21   [facility]  QUEtiapine (SEROQUEL) 400 MG tablet Take 800 mg by mouth at bedtime.     Provider, Historical, [facility]  thiamine 100 MG tablet TAKE 1 TABLET (100 MG TOTAL) BY MOUTH DAILY. 05/16/20 05/16/21  Florencia Reasons, [facility]  Thiamine HCl (VITAMIN B-1) 250 MG tablet Take 1 tablet (250 mg total) by mouth daily. 06/12/20   Colon Branch, [facility]  timolol (TIMOPTIC) 0.5 % ophthalmic solution Place 1 drop into both eyes 2 (two) times daily. 03/17/19   Provider, Historical, [facility]  vitamin B-12 (CYANOCOBALAMIN) 500 MCG tablet Take 500 mcg by mouth daily.    Provider, Historical, [facility]    Allergies    Omnipaque [iohexol]  Review of Systems   Review of Systems  Constitutional:  Negative for fever.  Respiratory:  Negative for shortness of breath.   Cardiovascular:  Negative for chest pain.  Gastrointestinal:  Negative for abdominal pain.  Musculoskeletal:  Negative for back pain and neck pain.       Right leg pain  Skin:  Positive for wound.  Neurological:  Negative for headaches.       No head trauma or loc   Physical Exam Updated Vital Signs BP (!) 145/92   Pulse 73   Temp (!) 97.1 F (36.2 C) (Temporal)   Resp 18   Ht 6\' 1"  (1.854 m)   Wt 93.9 kg   SpO2 100%   BMI 27.31 kg/m   Physical Exam Constitutional:      General: He is not in acute distress.    Appearance: He is well-developed.  Eyes:     Conjunctiva/sclera: Conjunctivae normal.  Cardiovascular:     Rate and Rhythm: Normal rate and regular rhythm.  Pulmonary:     Effort: Pulmonary effort is normal.     Breath sounds: Normal breath sounds.  Chest:     Chest wall: No tenderness.  Abdominal:     Palpations: Abdomen is soft.     Tenderness: There is no abdominal tenderness.  Musculoskeletal:     Comments: No TTP to the CTL spine. TTP to the bilat ribs. TTP to the right tib/fib with some associated swelling. 4x2 cm laceration with adipose tissue exposed. Underlying fascia appears intact and does not probe to the level of bone.   Skin:    General: Skin is warm and dry.  Neurological:     Mental Status: He is alert and oriented  to person, place, and time.     Comments: Mental Status:  Alert, thought content appropriate, able to give a coherent history. Speech fluent without evidence of aphasia. Able to follow 2 step commands without difficulty.  Cranial Nerves:  II:  pupils equal, round, reactive to light III,IV, VI: ptosis not present, extra-ocular motions intact bilaterally  V,VII: smile symmetric, facial light touch sensation equal VIII: hearing grossly normal to voice  X: uvula elevates symmetrically  XI: bilateral shoulder shrug symmetric and strong XII: midline tongue extension without fassiculations Motor:  Normal tone. 5/5 strength of BUE and BLE major muscle groups including strong and equal grip strength and dorsiflexion/plantar flexion Sensory: light touch normal in all extremities. DP pulses 2+ and symmetric     ED Results / Procedures / Treatments   Labs (all labs ordered are listed, but only abnormal results are displayed) Labs Reviewed - No data to display  EKG None  Radiology DG Tibia/Fibula Right  Result Date: 03/14/2021 CLINICAL DATA:  Status post fall. EXAM: RIGHT TIBIA AND FIBULA - 2 VIEW COMPARISON:  None. FINDINGS: A small, nondisplaced fracture is seen involving the mid shaft of right fibula. There is moderate to marked severity vascular calcification. An ill-defined superficial soft tissue defect is seen along the lateral aspect of the mid right calf. IMPRESSION: 1. Small, nondisplaced fracture involving the mid shaft of the right fibula. 2. Soft tissue laceration along the lateral aspect of the mid right calf. Electronically Signed   By: Virgina Norfolk M.D.   On: 03/14/2021 15:44   CT Head Wo Contrast  Result Date: 03/14/2021 CLINICAL DATA:  Status post fall. EXAM: CT HEAD WITHOUT CONTRAST TECHNIQUE: Contiguous axial images were obtained from the base of the skull through the vertex without intravenous contrast. COMPARISON:  May 11, 2020 FINDINGS: Brain: There is mild  cerebral atrophy with widening of the extra-axial spaces and ventricular dilatation. There are areas of decreased attenuation within the white matter tracts of the supratentorial brain, consistent with microvascular disease changes. Chronic bilateral subdural hygromas are seen, left larger than right. Vascular: No hyperdense vessel or unexpected calcification. Skull: Normal. Negative for fracture or focal lesion. Sinuses/Orbits: No acute finding. Other: None. IMPRESSION: 1. Chronic bilateral subdural hygromas, left larger than right. 2. No acute intracranial abnormality. Electronically Signed   By: Virgina Norfolk M.D.   On: 03/14/2021 15:33    Procedures .Marland KitchenLaceration Repair  Date/Time: 03/14/2021 6:49 PM Performed by: Rodney Booze, PA-C Authorized by: Rodney Booze, PA-C   Consent:    Consent obtained:  Verbal   Consent given by:  Patient   Risks, benefits, and alternatives were discussed: yes     Risks discussed:  Infection, pain and need for additional repair   Alternatives discussed:  No treatment Universal protocol:    Procedure explained and questions answered to patient or proxy's satisfaction: yes     Immediately prior to procedure, a time out was called: yes     Patient identity confirmed:  Verbally with patient Anesthesia:    Anesthesia method:  Local infiltration   Local anesthetic:  Lidocaine 2% WITH epi Laceration details:    Location:  Leg   Leg location:  R lower leg   Length (cm):  4 Pre-procedure details:    Preparation:  Patient was prepped and draped in usual sterile fashion and imaging obtained to evaluate for foreign bodies Exploration:    Limited defect  created (wound extended): yes     Hemostasis achieved with:  Epinephrine and direct pressure   Imaging outcome: foreign body not noted     Wound exploration: wound explored through full range of motion     Wound extent: no fascia violation noted     Contaminated: no   Treatment:    Area cleansed  with:  Povidone-iodine and saline   Amount of cleaning:  Extensive   Irrigation solution:  Sterile saline   Irrigation volume:  2L   Irrigation method:  Pressure wash   Visualized foreign bodies/material removed: no     Debridement:  None   Undermining:  None   Scar revision: no   Skin repair:    Repair method:  Sutures   Suture size:  3-0   Suture material:  Nylon   Suture technique:  Simple interrupted   Number of sutures:  3 Approximation:    Approximation:  Loose Repair type:    Repair type:  Simple Post-procedure details:    Dressing:  Splint for protection (xeroform)   Procedure completion:  Tolerated   Medications Ordered in ED Medications  ceFAZolin (ANCEF) IVPB 2g/100 mL premix (0 g Intravenous Stopped 03/14/21 1645)  lidocaine-EPINEPHrine (XYLOCAINE W/EPI) 2 %-1:200000 (PF) injection 20 mL (20 mLs Intradermal Given by Other 03/14/21 1733)    ED Course  I have reviewed the triage vital signs and the nursing notes.  Pertinent labs & imaging results that were available during my care of the patient were reviewed by me and considered in my medical decision making (see chart for details).    MDM Rules/Calculators/A&P                          70 y/o male here for mechanical fall  Reviewed/interpreted imaging CT head - 1. Chronic bilateral subdural hygromas, left larger than right. 2. No acute intracranial abnormality. Xray right tib/fib- 1. Small, nondisplaced fracture involving the mid shaft of the right fibula. 2. Soft tissue laceration along the lateral aspect of the mid right calf.  4:22 PM CONSULT with Dr. Marlou Sa who requests pictures of the wound. He will review and call me back.   Discussed with pt who consents to having pictures taking of his leg and sent nonsecurely to Dr. Marlou Sa.   4:53 PM Dr Marlou Sa recommends reapproximation of the wound edges with 2-0 nylon (this ED only has 3-0 Nylon available. He recommends pt f/u in the office on Monday afternoon in  clinic. Recommends CAM walker and weight bearing as tolerated  Tdap UTD. Wound was loosely closed with sutures after copious irrigation with ns and betadine solution. Pt tolerated procedure well. Will place on keflex and have him f/u with ortho.   Dr. Roslynn Amble personally evaluated the pt and is in agreement with the plan. All questions answered, pt stable for discharge    Final Clinical Impression(s) / ED Diagnoses Final diagnoses:  Fall, initial encounter  Closed fracture of shaft of right fibula, unspecified fracture morphology, initial encounter    Rx / DC Orders ED Discharge Orders          Ordered    cephALEXin (KEFLEX) 500 MG capsule  4 times daily        03/14/21 1849             Rodney Booze, PA-C 03/14/21 [place], [place], DO 03/14/21 2027

## 2021-03-14 NOTE — Discharge Instructions (Addendum)
You were given a prescription for antibiotics. Please take the antibiotic prescription fully.   You can bear weight as tolerated. Keep the leg elevated when sitting to reduce swelling  Call Dr. Forbes Cellar office on Monday to schedule an appointment for follow up  Please return to the emergency department for any new or worsening symptoms.

## 2021-03-14 NOTE — ED Triage Notes (Addendum)
Pt reports he slipped and fell down ~14 interior stairs last night and hit a chest at the bottom of the stairs. Laceration present to right LE with bandage in place. States he went to Urgent Care first and they sent him here for sutures and further eval. Denies LOC

## 2021-03-14 NOTE — ED Notes (Signed)
Pt d/c home per MD order. Discharge summary reviewed, verbalize understanding. Off unit via [place]; D/C home with visitor. No s/s of acute distress noted at discharge.

## 2021-03-17 ENCOUNTER — Other Ambulatory Visit: Payer: Self-pay

## 2021-03-17 ENCOUNTER — Ambulatory Visit: Payer: Medicare Other | Admitting: Orthopedic Surgery

## 2021-03-17 ENCOUNTER — Encounter: Payer: Self-pay | Admitting: Orthopedic Surgery

## 2021-03-17 DIAGNOSIS — S81811A Laceration without foreign body, right lower leg, initial encounter: Secondary | ICD-10-CM | POA: Diagnosis not present

## 2021-03-18 NOTE — Progress Notes (Signed)
Office Visit Note   Patient: Jeffrey Acosta           Date of Birth: 1950-07-16           MRN: [id number] Visit Date: 03/17/2021 Requested by: Colon Branch, [address] [place],  [place] [address] PCP: Colon Branch, MD  Subjective: Chief Complaint  Patient presents with   Right Leg - New Patient (Initial Visit)    HPI: Jeffrey Acosta is a 70 year old patient who fell down the stairs several days ago.  Taken to the emergency department where he was noted to have a puncture type wound to his right leg.  He has been ambulating with a cam walker.  He is taking antibiotics.  Not taking any medicine for pain.  He is not on blood thinners and has no history of DVT.              ROS: All systems reviewed are negative as they relate to the chief complaint within the history of present illness.  Patient denies  fevers or chills.   Assessment & Plan: Visit Diagnoses:  1. Leg laceration, right, initial encounter     Plan: Impression is right leg laceration which goes into the muscle.  Small chip off of the fibula but this was thoroughly irrigated in the emergency department and loosely reapproximated.  Drainage is present but no evidence of erythema or infection.  Recommend keep dry dressing and to promote influx of fluid.  7-day return for clinical recheck.  Follow-Up Instructions: No follow-ups on file.   Orders:  No orders of the defined types were placed in this encounter.  No orders of the defined types were placed in this encounter.     Procedures: No procedures performed   Clinical Data: No additional findings.  Objective: Vital Signs: There were no vitals taken for this visit.  Physical Exam:   Constitutional: Patient appears well-developed HEENT:  Head: Normocephalic Eyes:EOM are normal Neck: Normal range of motion Cardiovascular: Normal rate Pulmonary/chest: Effort normal Neurologic: Patient is alert Skin: Skin is warm Psychiatric: Patient has normal  mood and affect   Ortho Exam: Ortho exam demonstrates palpable pedal pulses in the right leg.  He has a stellate type laceration measuring about 5 x 1 cm.  Compartments are soft.  3 nylon sutures have loosely reapproximated the skin but there is still room for influx.  Patient is able to weight-bear.  No erythema or purulent drainage from this region.  Specialty Comments:  No specialty comments available.  Imaging: No results found.   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[place]) 11/25/2012   Alcoholism in remission ([place]) 06/19/2011   Diverticulitis of colon (without mention of hemorrhage)(562.11) 06/19/2011   Bipolar I disorder ([place]) 06/19/2011   Osteopenia 05/17/2010   Alcohol abuse 05/16/2010   HYPERTRIGLYCERIDEMIA 08/02/2009   Macrocytic anemia 01/10/2009   ABNORMAL ELECTROCARDIOGRAM 11/26/2008   REFLUX, ESOPHAGEAL 11/17/2006   COMMON MIGRAINE 09/22/2006  Past Medical History:  Diagnosis Date   Alcoholism in remission ([place])    in AA   Arrhythmia    atrial tachycardia   Bipolar affective disorder ([place])    Diverticulosis of colon (without mention of hemorrhage)    GERD (gastroesophageal reflux disease)    Gout of multiple sites    Hiatal hernia    Hx of migraines    Hydrocele    HYPERLIPIDEMIA 01/10/2009   Qualifier: Diagnosis of  By: Ronnald Ramp CMA, Chemira    NMR Lipoprofile 2011 LDL could not be calculated  Due to TG of 889  (811  / 750 ),   HDL 50 . Father MI @ 83 PGM CVA early 52s; PGF CVA @72     Hypothyroidism    Melanoma of skin, site unspecified 11/25/2012   07/2011 abdominal  Stage 1 Dr Strycharz Lacy [place] Dr Sarajane Jews    Osteopenia    Polyarthralgia    Status post placement of implantable loop recorder 08/26/2020   Stroke [facility])    Subarachnoid hemorrhage ([facility])     Family History  Problem Relation Age of Onset   COPD Father    Heart attack Father 29   COPD Mother    Diabetes Paternal Grandmother    Parkinsonism Paternal Grandmother    Stroke Paternal Grandmother 33   Stroke Maternal Grandfather 72   Lung cancer Paternal Grandfather        Heavy smoker   Colon cancer Neg Hx    Prostate cancer Neg Hx    Esophageal cancer Neg Hx    Rectal cancer Neg Hx    Stomach cancer Neg Hx     Past Surgical History:  Procedure Laterality Date   BILIARY BRUSHING  12/07/2018   Procedure: BILIARY BRUSHING;  Surgeon: Irving Copas., MD;  Location: [facility];  Service: Gastroenterology;;   BILIARY DILATION  12/07/2018   Procedure: BILIARY DILATION;  Surgeon: Irving Copas., MD;  Location: [facility];  Service: Gastroenterology;;   BIOPSY  12/07/2018   Procedure: BIOPSY;  Surgeon: Irving Copas., MD;  Location: [facility];  Service: Gastroenterology;;   CHOLECYSTECTOMY N/A 01/29/2021   Procedure: LAPAROSCOPIC CHOLECYSTECTOMY WITH ICG DYE;  Surgeon: Rolm Bookbinder, MD;  Location: [place];  Service: General;  Laterality: N/A;   COLONOSCOPY  2003   negative ; Dr Sharlett Iles   ERCP N/A 12/07/2018   Procedure: ENDOSCOPIC RETROGRADE CHOLANGIOPANCREATOGRAPHY (ERCP);  Surgeon: Irving Copas., MD;  Location: [facility];  Service: Gastroenterology;  Laterality: N/A;   ESOPHAGOGASTRODUODENOSCOPY (EGD) WITH PROPOFOL N/A 12/07/2018   Procedure: ESOPHAGOGASTRODUODENOSCOPY (EGD) WITH PROPOFOL;  Surgeon: Rush Landmark Telford Nab., MD;  Location: [facility];  Service: Gastroenterology;  Laterality: N/A;   EUS N/A  12/07/2018   Procedure: UPPER ENDOSCOPIC ULTRASOUND (EUS) RADIAL;  Surgeon: Irving Copas., MD;  Location: [facility];  Service: Gastroenterology;  Laterality: N/A;   INGUINAL HERNIA REPAIR  08/07/2011   Procedure: HERNIA REPAIR INGUINAL ADULT;  Surgeon: Rolm Bookbinder, MD;  Location: [place];  Service: General;  Laterality: Right;   IR ANGIO INTRA EXTRACRAN SEL INTERNAL CAROTID BILAT MOD SED  05/14/2020   IR ANGIO VERTEBRAL SEL VERTEBRAL BILAT MOD SED  05/14/2020   IR GUIDED DRAIN W CATHETER PLACEMENT  02/05/2021   IR RADIOLOGIST EVAL & MGMT  02/13/2021   LOOP RECORDER INSERTION     06/2020   MELANOMA EXCISION  07/28/11   Dr Sarajane Jews; stomach and chest   REMOVAL OF STONES  12/07/2018   Procedure: REMOVAL OF STONES;  Surgeon: Rush Landmark Telford Nab., MD;  Location: [facility];  Service: Gastroenterology;;   TONSILLECTOMY  1951   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   UPPER GASTROINTESTINAL ENDOSCOPY  1983   hiatal hernia   VASECTOMY  1979   Social History   Occupational History   Occupation: retired  Tobacco Use   Smoking status: Never   Smokeless tobacco: Never  Vaping Use   Vaping Use: Never used  Substance and Sexual Activity   Alcohol use: Not Currently    Comment: currently sober since 06/2020   Drug use: No   Sexual activity: Yes    Partners: Female    Comment: Having some issues with ED.  Would like provider's recommendation.

## 2021-03-19 ENCOUNTER — Other Ambulatory Visit: Payer: Self-pay

## 2021-03-19 ENCOUNTER — Encounter: Payer: Self-pay | Admitting: Internal Medicine

## 2021-03-19 ENCOUNTER — Ambulatory Visit (INDEPENDENT_AMBULATORY_CARE_PROVIDER_SITE_OTHER): Payer: Medicare Other | Admitting: Internal Medicine

## 2021-03-19 VITALS — BP 116/80 | HR 60 | Temp 98.0°F | Resp 16 | Ht 73.0 in | Wt 192.2 lb

## 2021-03-19 DIAGNOSIS — W19XXXD Unspecified fall, subsequent encounter: Secondary | ICD-10-CM

## 2021-03-19 DIAGNOSIS — R1011 Right upper quadrant pain: Secondary | ICD-10-CM | POA: Diagnosis not present

## 2021-03-19 DIAGNOSIS — Z7185 Encounter for immunization safety counseling: Secondary | ICD-10-CM

## 2021-03-19 DIAGNOSIS — F1010 Alcohol abuse, uncomplicated: Secondary | ICD-10-CM | POA: Diagnosis not present

## 2021-03-19 DIAGNOSIS — Z23 Encounter for immunization: Secondary | ICD-10-CM | POA: Diagnosis not present

## 2021-03-19 NOTE — Progress Notes (Signed)
Subjective:    Patient ID: Jeffrey Acosta, male    DOB: 06-Aug-1950, 70 y.o.   MRN: [id number]  DOS:  03/19/2021 Type of visit - description: f/u, here w/  his wife Jeffrey Acosta   Since her last visit, had a gallbladder removed.  Doing well. Also went to the ER after a fall, work-up included CT head with no acute findings, had a puncture wound of the right leg that was treated. Subsequently saw orthopedics.  Note reviewed.  Review of Systems Since the fall he is doing well.  Denies any headache, neck pain.  No nausea or vomiting.   Past Medical History:  Diagnosis Date   Alcoholism in remission ([place])    in AA   Arrhythmia    atrial tachycardia   Bipolar affective disorder ([place])    Diverticulosis of colon (without mention of hemorrhage)    GERD (gastroesophageal reflux disease)    Gout of multiple sites    Hiatal hernia    Hx of migraines    Hydrocele    HYPERLIPIDEMIA 01/10/2009   Qualifier: Diagnosis of  By: Ronnald Ramp CMA, Chemira    NMR Lipoprofile 2011 LDL could not be calculated  Due to TG of 889  (811  / 750 ),  HDL 50 . Father MI @ 49 PGM CVA early 55s; PGF CVA @72     Hypothyroidism    Melanoma of skin, site unspecified 11/25/2012   07/2011 abdominal  Stage 1 Dr Rackers Lacy [place] Dr Sarajane Jews    Osteopenia    Polyarthralgia    Status post placement of implantable loop recorder 08/26/2020   Stroke [facility])    Subarachnoid hemorrhage [facility])     Past Surgical History:  Procedure Laterality Date   BILIARY BRUSHING  12/07/2018   Procedure: BILIARY BRUSHING;  Surgeon: Irving Copas., MD;  Location: [facility];  Service: Gastroenterology;;   BILIARY DILATION  12/07/2018   Procedure: BILIARY DILATION;  Surgeon: Irving Copas., MD;  Location: [facility];  Service: Gastroenterology;;   BIOPSY  12/07/2018   Procedure: BIOPSY;  Surgeon: Irving Copas., MD;  Location: [facility];  Service: Gastroenterology;;   CHOLECYSTECTOMY N/A 01/29/2021   Procedure: LAPAROSCOPIC  CHOLECYSTECTOMY WITH ICG DYE;  Surgeon: Rolm Bookbinder, MD;  Location: [place];  Service: General;  Laterality: N/A;   COLONOSCOPY  2003   negative ; Dr Sharlett Iles   ERCP N/A 12/07/2018   Procedure: ENDOSCOPIC RETROGRADE CHOLANGIOPANCREATOGRAPHY (ERCP);  Surgeon: Irving Copas., MD;  Location: [facility];  Service: Gastroenterology;  Laterality: N/A;   ESOPHAGOGASTRODUODENOSCOPY (EGD) WITH PROPOFOL N/A 12/07/2018   Procedure: ESOPHAGOGASTRODUODENOSCOPY (EGD) WITH PROPOFOL;  Surgeon: Rush Landmark Telford Nab., MD;  [facility];  Service: Gastroenterology;  Laterality: N/A;   EUS N/A 12/07/2018   Procedure: UPPER ENDOSCOPIC ULTRASOUND (EUS) RADIAL;  Surgeon: Irving Copas., MD;  Location: [facility];  Service: Gastroenterology;  Laterality: N/A;   INGUINAL HERNIA REPAIR  08/07/2011   Procedure: HERNIA REPAIR INGUINAL ADULT;  Surgeon: Rolm Bookbinder, MD;  Location: [place];  Service: General;  Laterality: Right;   IR ANGIO INTRA EXTRACRAN SEL INTERNAL CAROTID BILAT MOD SED  05/14/2020   IR ANGIO VERTEBRAL SEL VERTEBRAL BILAT MOD SED  05/14/2020   IR GUIDED DRAIN W CATHETER PLACEMENT  02/05/2021   IR RADIOLOGIST EVAL & MGMT  02/13/2021   LOOP RECORDER INSERTION     06/2020   MELANOMA EXCISION  07/28/11   Dr Sarajane Jews; stomach and chest   REMOVAL OF STONES  12/07/2018  Procedure: REMOVAL OF STONES;  Surgeon: Mansouraty, Telford Nab., MD;  Location: [facility];  Service: Gastroenterology;;   TONSILLECTOMY  1951   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   UPPER GASTROINTESTINAL ENDOSCOPY  1983   hiatal hernia   VASECTOMY  1979    Allergies as of 03/19/2021       Reactions   Omnipaque [iohexol] Hives    Desc: developed hives after injection; resolved with 50 mg benadryl given to pt.        Medication List        Accurate as of March 19, 2021 11:59 PM. If you have any questions, ask your nurse or doctor.          STOP taking  these medications    dicyclomine 20 MG tablet Commonly known as: BENTYL Stopped by: Kathlene November, MD   oxyCODONE 5 MG immediate release tablet Commonly known as: Oxy IR/ROXICODONE Stopped by: Kathlene November, MD       TAKE these medications    Acetaminophen-Codeine 300-30 MG tablet Take 1 tablet by mouth every 6 (six) hours as needed for pain.   allopurinol 300 MG tablet Commonly known as: ZYLOPRIM Take 1 tablet (300 mg total) by mouth daily.   amoxicillin 875 MG tablet Commonly known as: AMOXIL Take 875 mg by mouth 2 (two) times daily.   aspirin EC 81 MG tablet Take 81 mg by mouth daily. Swallow whole.   b complex vitamins capsule Take 1 capsule by mouth daily.   cephALEXin 500 MG capsule Commonly known as: KEFLEX Take 1 capsule (500 mg total) by mouth 4 (four) times daily for 7 days.   esomeprazole 40 MG capsule Commonly known as: NEXIUM TAKE 1 CAPSULE(40 MG) BY MOUTH DAILY BEFORE BREAKFAST   fluticasone 50 MCG/ACT nasal spray Commonly known as: FLONASE Place 2 sprays into both nostrils daily as needed for congestion.   folic acid 1 MG tablet Commonly known as: FOLVITE Take 1 tablet (1 mg total) by mouth daily.   lamoTRIgine 25 MG tablet Commonly known as: LAMICTAL Take 25 mg by mouth daily.   levothyroxine 50 MCG tablet Commonly known as: SYNTHROID TAKE 1 TABLET(50 MCG) BY MOUTH DAILY BEFORE BREAKFAST   metoprolol succinate 100 MG 24 hr tablet Commonly known as: TOPROL-XL TAKE 1 AND 1/2 TABLETS(150 MG) BY MOUTH DAILY WITH OR IMMEDIATELY FOLLOWING A MEAL   naltrexone 50 MG tablet Commonly known as: DEPADE Take 50 mg by mouth daily.   QUEtiapine 400 MG tablet Commonly known as: SEROQUEL Take 800 mg by mouth at bedtime.   timolol 0.5 % ophthalmic solution Commonly known as: TIMOPTIC Place 1 drop into both eyes 2 (two) times daily.   thiamine 100 MG tablet TAKE 1 TABLET (100 MG TOTAL) BY MOUTH DAILY.   vitamin B-1 250 MG tablet Take 1 tablet (250 mg  total) by mouth daily.   vitamin B-12 500 MCG tablet Commonly known as: CYANOCOBALAMIN Take 500 mcg by mouth daily.   Vitamin D 50 MCG (2000 UT) tablet Take 2,000 Units by mouth daily.           Objective:   Physical Exam BP 116/80 (BP Location: Left Arm, Patient Position: Sitting, Cuff Size: Small)   Pulse 60   Temp 98 F (36.7 C) (Oral)   Resp 16   Ht 6\' 1"  (1.854 m)   Wt 192 lb 4 oz (87.2 kg)   BMI 25.36 kg/m  General:   Well developed, NAD, BMI noted.  HEENT:  Normocephalic . Face symmetric, atraumatic Lungs:  CTA B Normal respiratory effort, no intercostal retractions, no accessory muscle use. Heart: RRR,  no murmur.  Lower extremities: no pretibial edema bilaterally  Skin: Not pale. Not jaundice Neurologic:  alert & oriented X3.  Speech normal, gait appropriate for age and unassisted Psych--  Cognition and judgment appear intact.  Cooperative with normal attention span and concentration.  Behavior appropriate. No anxious or depressed appearing.      Assessment    Assessment  Bipolar disorder-- Dr Toy Care, dc lithium 07-2014 EtOH abuse-- remission w/ occ relapse Hypothyroidism Hyperlipidemia E.D.  Atrial Tachycardia dx 03/2019, + Zio patch,  MSK: ---Back pain: Saw Dr. Ramos/Geoffrey~ 2002, 3 local injections, offered surgery.  ---Gout: Dr Berna Bue, released to PCP 2018 Melanoma 2014 Dr. [place], sees derm regularly as off 02-2020 Osteopenia  T score -2.4  ( [phone];  T score is -2.0  (08/2018). 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 Had a CT head with no  acute findings. Had a follow-up with Ortho 03/17/2021, was recommended to go back there a week later. EtOH: Denies any recent excessive drinking Preventive care, flu shot today, rec COVID-vaccine and consider shingrix RTC scheduled for January 2023   This visit occurred during the SARS-CoV-2 public health emergency.  Safety protocols were in place, including screening questions prior to the visit, additional usage of staff PPE, and extensive cleaning of exam room while observing appropriate contact time as indicated for disinfecting solutions.

## 2021-03-19 NOTE — Progress Notes (Signed)
Carelink Summary Report / Loop Recorder 

## 2021-03-19 NOTE — Patient Instructions (Addendum)
Recommend to get a COVID-vaccine at your convenience  Consider shingles shot at some point.   I will see you in January

## 2021-03-21 NOTE — Assessment & Plan Note (Signed)
Assessment  Bipolar disorder-- Dr Toy Care, dc lithium 07-2014 EtOH abuse-- remission w/ occ relapse Hypothyroidism Hyperlipidemia E.D.  Atrial Tachycardia dx 03/2019, + Zio patch,  MSK: ---Back pain: Saw Dr. Ramos/Geoffrey~ 2002, 3 local injections, offered surgery.  ---Gout: Dr Berna Bue, released to PCP 2018 Melanoma 2014 Dr. [place], sees derm regularly as off 02-2020 Osteopenia  T score -2.4  ( [phone];  T score is -2.0  (08/2018). 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 Had a CT head with no acute findings. Had a follow-up with Ortho 03/17/2021, was recommended to go back there a week later. EtOH: Denies any recent excessive drinking Preventive care, flu shot today, rec COVID-vaccine and consider shingrix RTC scheduled for January 2023

## 2021-03-24 ENCOUNTER — Other Ambulatory Visit: Payer: Self-pay

## 2021-03-24 ENCOUNTER — Ambulatory Visit: Payer: Self-pay

## 2021-03-24 ENCOUNTER — Ambulatory Visit: Payer: Medicare Other | Admitting: Orthopedic Surgery

## 2021-03-24 DIAGNOSIS — M79644 Pain in right finger(s): Secondary | ICD-10-CM | POA: Diagnosis not present

## 2021-03-25 ENCOUNTER — Ambulatory Visit: Payer: Self-pay

## 2021-03-25 ENCOUNTER — Encounter: Payer: Self-pay | Admitting: Orthopedic Surgery

## 2021-03-25 ENCOUNTER — Ambulatory Visit: Payer: Medicare Other | Admitting: Orthopedic Surgery

## 2021-03-25 DIAGNOSIS — S63104A Unspecified dislocation of right thumb, initial encounter: Secondary | ICD-10-CM | POA: Diagnosis not present

## 2021-03-25 NOTE — Progress Notes (Signed)
Office Visit Note   Patient: Jeffrey Acosta           Date of Birth: Aug 22, 1950           MRN: [id number] Visit Date: 03/24/2021 Requested by: Colon Branch, [address] [place],  [place] [address] PCP: Colon Branch, MD  Subjective: Chief Complaint  Patient presents with   Right Leg - Follow-up    DOI: 03/14/21    HPI: Onnie is a 70 year old patient here to follow-up right leg laceration.  Date of injury 03/14/2021.  Laceration was irrigated and loosely reapproximated in the emergency department.  He was doing well until he overdid it Saturday going to football game.  Had some swelling since then.  He also reports some left-sided flank bruising.  Also at the time of his injury he reports injuring his right thumb.  He is discussing that for the first time today at this clinic appointment.  Reports pain and swelling in the thumb and he is right-hand dominant              ROS: All systems reviewed are negative as they relate to the chief complaint within the history of present illness.  Patient denies  fevers or chills.   Assessment & Plan: Visit Diagnoses:  1. Pain of right thumb     Plan: Impression is right leg laceration healing reasonably well.  Sutures need to come out on Friday.  Continue with dry dressing.  Patient has finished with antibiotics.  Flank bruising consistent with fall but not having any type of hemodynamic instability or dizziness so this looks like it should be a self-limited problem.  Right thumb has IP joint dislocation which is about 22 days old.  Numbed him up with a digital block and reduced it with some force but it was a slightly unstable reduction.  Splint applied.  Follow-up with Dr. Tempie Donning on Tuesday for consideration of pinning.  Follow-up with [place] on Friday for suture removal.  Right leg.  Follow-Up Instructions: No follow-ups on file.   Orders:  Orders Placed This Encounter  Procedures   XR Finger Thumb Right   No orders of the  defined types were placed in this encounter.     Procedures: No procedures performed   Clinical Data: No additional findings.  Objective: Vital Signs: There were no vitals taken for this visit.  Physical Exam:   Constitutional: Patient appears well-developed HEENT:  Head: Normocephalic Eyes:EOM are normal Neck: Normal range of motion Cardiovascular: Normal rate Pulmonary/chest: Effort normal Neurologic: Patient is alert Skin: Skin is warm Psychiatric: Patient has normal mood and affect   Ortho Exam: Ortho exam demonstrates granulation tissue around that right calf region.  Mild drainage is present but there is no erythema induration or fluctuance present.  Compartments are soft.  Ankle dorsiflexion strength intact.  Patient does have about a palm sized area of ecchymosis around the flank region.  No groin pain with internal and external rotation of the left hip.  Patient also has swelling around the IP joint of the right thumb.  No MCP or CMC tenderness or swelling.  Wrist range of motion intact.  No flexion at that joint is present.  Specialty Comments:  No specialty comments available.  Imaging: XR C-ARM NO REPORT  Result Date: 03/25/2021 Please see Notes tab for imaging impression.    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/2011   Diverticulitis of colon (without mention of  hemorrhage)(562.11) 06/19/2011   Bipolar I disorder ([place]) 06/19/2011   Osteopenia 05/17/2010   Alcohol abuse 05/16/2010   HYPERTRIGLYCERIDEMIA 08/02/2009   Macrocytic anemia 01/10/2009   ABNORMAL ELECTROCARDIOGRAM 11/26/2008   REFLUX, ESOPHAGEAL 11/17/2006   COMMON MIGRAINE 09/22/2006   Past Medical History:  Diagnosis Date   Alcoholism in remission ([place])    in AA   Arrhythmia    atrial tachycardia   Bipolar affective disorder ([place])    Diverticulosis of colon (without mention of hemorrhage)    GERD (gastroesophageal reflux disease)    Gout of multiple sites    Hiatal hernia    Hx of migraines    Hydrocele    HYPERLIPIDEMIA 01/10/2009   Qualifier: Diagnosis of  By: Ronnald Ramp CMA, Chemira    NMR Lipoprofile 2011 LDL could not be calculated  Due to TG of 889  (811  / 750 ),  HDL 50 . Father MI @ 61 PGM CVA early 64s; PGF CVA @72     Hypothyroidism    Melanoma of skin, site unspecified 11/25/2012   07/2011 abdominal  Stage 1 Dr Churchill Lacy [place] Dr Sarajane Jews    Osteopenia    Polyarthralgia    Status post placement of implantable loop recorder 08/26/2020   Stroke [facility])    Subarachnoid hemorrhage (HCC)     Family History  Problem Relation Age of Onset   COPD Father    Heart attack Father 38   COPD Mother    Diabetes Paternal Grandmother    Parkinsonism Paternal Grandmother    Stroke Paternal Grandmother 77   Stroke Maternal Grandfather 72   Lung cancer Paternal Grandfather        Heavy smoker   Colon cancer Neg Hx    Prostate cancer Neg Hx    Esophageal cancer Neg Hx    Rectal cancer Neg Hx    Stomach cancer Neg Hx     Past Surgical History:  Procedure Laterality Date   BILIARY BRUSHING  12/07/2018   Procedure: BILIARY BRUSHING;  Surgeon: Irving Copas., MD;  Location: [facility];  Service: Gastroenterology;;   BILIARY DILATION  12/07/2018   Procedure: BILIARY DILATION;  Surgeon: Irving Copas., MD;  Location: [facility];  Service: Gastroenterology;;   BIOPSY   12/07/2018   Procedure: BIOPSY;  Surgeon: Irving Copas., MD;  Location: [facility];  Service: Gastroenterology;;   CHOLECYSTECTOMY N/A 01/29/2021   Procedure: LAPAROSCOPIC CHOLECYSTECTOMY WITH ICG DYE;  Surgeon: Rolm Bookbinder, MD;  Location: [facility];  Service: General;  Laterality: N/A;   COLONOSCOPY  2003   negative ; Dr Sharlett Iles   ERCP N/A 12/07/2018   Procedure: ENDOSCOPIC RETROGRADE CHOLANGIOPANCREATOGRAPHY (ERCP);  Surgeon: Irving Copas., MD;  Location: [facility];  Service: Gastroenterology;  Laterality: N/A;   ESOPHAGOGASTRODUODENOSCOPY (EGD) WITH PROPOFOL N/A 12/07/2018   Procedure: ESOPHAGOGASTRODUODENOSCOPY (EGD) WITH PROPOFOL;  Surgeon: Rush Landmark Telford Nab., MD;  Location: [facility];  Service: Gastroenterology;  Laterality: N/A;  EUS N/A 12/07/2018   Procedure: UPPER ENDOSCOPIC ULTRASOUND (EUS) RADIAL;  Surgeon: Irving Copas., MD;  Location: [facility];  Service: Gastroenterology;  Laterality: N/A;   INGUINAL HERNIA REPAIR  08/07/2011   Procedure: HERNIA REPAIR INGUINAL ADULT;  Surgeon: Rolm Bookbinder, MD;  Location: [facility];  Service: General;  Laterality: Right;   IR ANGIO INTRA EXTRACRAN SEL INTERNAL CAROTID BILAT MOD SED  05/14/2020   IR ANGIO VERTEBRAL SEL VERTEBRAL BILAT MOD SED  05/14/2020   IR GUIDED DRAIN W CATHETER PLACEMENT  02/05/2021   IR RADIOLOGIST EVAL & MGMT  02/13/2021   LOOP RECORDER INSERTION     06/2020   MELANOMA EXCISION  07/28/11   Dr Sarajane Jews; stomach and chest   REMOVAL OF STONES  12/07/2018   Procedure: REMOVAL OF STONES;  Surgeon: Irving Copas., MD;  Location: [facility];  Service: Gastroenterology;;   TONSILLECTOMY  1951   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   UPPER GASTROINTESTINAL ENDOSCOPY  1983   hiatal hernia   VASECTOMY  1979   Social History   Occupational History   Occupation: retired  Tobacco Use   Smoking status: Never   Smokeless tobacco: Never   Vaping Use   Vaping Use: Never used  Substance and Sexual Activity   Alcohol use: Not Currently    Comment: currently sober since 06/2020   Drug use: No   Sexual activity: Yes    Partners: Female    Comment: Having some issues with ED.  Would like provider's recommendation.

## 2021-03-25 NOTE — Progress Notes (Signed)
Office Visit Note   Patient: Jeffrey Acosta           Date of Birth: [birth year]           MRN: [id number] Visit Date: 03/25/2021              Requested by: Colon Branch, [address] [place],  [place] [address] PCP: Colon Branch, MD   Assessment & Plan: Visit Diagnoses:  1. Thumb dislocation, right, initial encounter     Plan: Reviewed previous x-rays with patient.  It looks like there is some residual dorsal subluxation of the distal phalanx relative to the proximal phalanx.  Thumb IP joint was re-reduced under fluoroscopy with digital block.  Placed in dorsal blocking thumb splint with IP in slight flexion.  Will repeat x-rays next week to recheck alignment.   Follow-Up Instructions: No follow-ups on file.   Orders:  Orders Placed This Encounter  Procedures   XR C-ARM NO REPORT   No orders of the defined types were placed in this encounter.     Procedures: Splinting  Date/Time: 03/25/2021 3:51 PM Performed by: Sherilyn Cooter, MD Authorized by: Sherilyn Cooter, MD   Consent Given by:  Patient Timeout: prior to procedure the correct patient, procedure, and site was verified   Location:  Finger  right thumb Dislocation Type: IP   Fracture Type: distal phalanx   MCP Joint Involved?: No   Any IP Joint Involved?: Yes   Neurovascularly intact   Distal Perfusion: normal   Distal Sensation: normal   Manipulation Performed?: Yes   Reduction Successful?: Yes   Confirmation: reduction confirmed by x-ray   Immobilization:  Splint Is this the patient's first splint for this injury?: No   Splint/Brace Type:  Short arm (Alumifoam splint) Supplies Used:  Aluminum splint Neurovascularly intact   Distal Perfusion: normal   Distal Sensation: normal   Patient tolerance:  Patient tolerated the procedure well with no immediate complications   Clinical Data: No additional findings.   Subjective: Chief Complaint  Patient presents with   Right Thumb -  Pain    This is a 70 yo RHD M who presents with a closed right thumb IP dislocation after falling down some stairs approximately 10 days ago.  He initially thought that he sprained his thumb.  He was seen by my partner yesterday who reduced the thumb under digital block.  He presents today for follow up.  He has no pain with the thumb in the splint.    Review of Systems   Objective: Vital Signs: There were no vitals taken for this visit.  Physical Exam Constitutional:      Appearance: Normal appearance.  Cardiovascular:     Rate and Rhythm: Normal rate.     Pulses: Normal pulses.  Pulmonary:     Effort: Pulmonary effort is normal.  Musculoskeletal:        General: Normal range of motion.  Skin:    General: Skin is warm and dry.     Capillary Refill: Capillary refill takes less than 2 seconds.  Neurological:     Mental Status: He is alert.    Right Hand Exam   Tenderness  Right hand tenderness location: TTP at thumb IP joint.  Other  Erythema: absent Sensation: normal Pulse: present  Comments:  Mild to moderate swelling of the thumb at the IP joint.      Specialty Comments:  No specialty comments available.  Imaging:  Previous x-rays of the right thumb reviewed and interpreted by me.  They demonstrate dorsal dislocation of the IP joint w/ interval reduction with some residual dorsal subluxation.    PMFS History: Patient Active Problem List   Diagnosis Date Noted   Dyslipidemia 05/23/2020   Benign essential HTN 05/06/2020   Subarachnoid hemorrhage ([place]) 04/30/2020   Leukopenia 07/15/2018   Thrombocytopenia ([place]) 07/15/2018   PCP NOTES >>>>>>> 03/23/2015   Annual physical exam 03/20/2015   Bronchospasm 09/06/2014   DJD (degenerative joint disease) 09/06/2014   Hypothyroidism 08/28/2014   Tremor 07/29/2014   Gait disorder 07/29/2014   Gout 01/08/2014   Vitamin D deficiency 11/25/2012   Melanoma of skin, site unspecified 11/25/2012   Posterior cerebral  atrophy ([place]) 11/25/2012   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   Diverticulitis of colon (without mention of hemorrhage)(562.11) 06/19/2011   Bipolar I disorder ([place]) 06/19/2011   Osteopenia 05/17/2010   Alcohol abuse 05/16/2010   HYPERTRIGLYCERIDEMIA 08/02/2009   Macrocytic anemia 01/10/2009   ABNORMAL ELECTROCARDIOGRAM 11/26/2008   REFLUX, ESOPHAGEAL 11/17/2006   COMMON MIGRAINE 09/22/2006   Past Medical History:  Diagnosis Date   Alcoholism in remission ([place])    in AA   Arrhythmia    atrial tachycardia   Bipolar affective disorder ([place])    Diverticulosis of colon (without mention of hemorrhage)    GERD (gastroesophageal reflux disease)    Gout of multiple sites    Hiatal hernia    Hx of migraines    Hydrocele    HYPERLIPIDEMIA 01/10/2009   Qualifier: Diagnosis of  By: Ronnald Ramp CMA, Chemira    NMR Lipoprofile 2011 LDL could not be calculated  Due to TG of 889  (811  / 750 ),  HDL 50 . Father MI @ 25 PGM CVA early 65s; PGF CVA @72     Hypothyroidism    Melanoma of skin, site unspecified 11/25/2012   07/2011 abdominal  Stage 1 Dr Boback Lacy [place] Dr Sarajane Jews    Osteopenia    Polyarthralgia    Status post placement of implantable loop recorder 08/26/2020   Stroke [facility])    Subarachnoid hemorrhage (HCC)     Family History  Problem Relation Age of Onset   COPD Father    Heart attack Father 72   COPD Mother    Diabetes Paternal Grandmother    Parkinsonism Paternal Grandmother    Stroke Paternal Grandmother 6   Stroke Maternal Grandfather 72   Lung cancer Paternal Grandfather        Heavy smoker   Colon cancer Neg Hx    Prostate cancer Neg Hx    Esophageal cancer Neg Hx    Rectal cancer Neg Hx    Stomach cancer Neg Hx     Past Surgical History:  Procedure Laterality Date   BILIARY BRUSHING  12/07/2018   Procedure: BILIARY BRUSHING;  Surgeon: Irving Copas., MD;  Location: [facility];  Service: Gastroenterology;;   BILIARY DILATION  12/07/2018   Procedure:  BILIARY DILATION;  Surgeon: Irving Copas., MD;  Location: [facility];  Service: Gastroenterology;;   BIOPSY  12/07/2018   Procedure: BIOPSY;  Surgeon: Irving Copas., MD;  Location: [facility];  Service: Gastroenterology;;   CHOLECYSTECTOMY N/A 01/29/2021   Procedure: LAPAROSCOPIC CHOLECYSTECTOMY WITH ICG DYE;  Surgeon: Rolm Bookbinder, MD;  Location: [facility];  Service: General;  Laterality: N/A;   COLONOSCOPY  2003   negative ; Dr Sharlett Iles   ERCP N/A 12/07/2018   Procedure: ENDOSCOPIC RETROGRADE CHOLANGIOPANCREATOGRAPHY (ERCP);  Surgeon: Irving Copas., MD;  Location: [facility];  Service: Gastroenterology;  Laterality: N/A;   ESOPHAGOGASTRODUODENOSCOPY (EGD) WITH PROPOFOL N/A 12/07/2018   Procedure: ESOPHAGOGASTRODUODENOSCOPY (EGD) WITH PROPOFOL;  Surgeon: Rush Landmark Telford Nab., MD;  [facility];  Service: Gastroenterology;  Laterality: N/A;   EUS N/A 12/07/2018   Procedure: UPPER ENDOSCOPIC ULTRASOUND (EUS) RADIAL;  Surgeon: Irving Copas., MD;  [facility];  Service: Gastroenterology;  Laterality: N/A;   INGUINAL HERNIA REPAIR  08/07/2011   Procedure: HERNIA REPAIR INGUINAL ADULT;  Surgeon: Rolm Bookbinder, MD;  Location: [facility];  Service: General;  Laterality: Right;   IR ANGIO INTRA EXTRACRAN SEL INTERNAL CAROTID BILAT MOD SED  05/14/2020   IR ANGIO VERTEBRAL SEL VERTEBRAL BILAT MOD SED  05/14/2020   IR GUIDED DRAIN W CATHETER PLACEMENT  02/05/2021   IR RADIOLOGIST EVAL & MGMT  02/13/2021   LOOP RECORDER INSERTION     06/2020   MELANOMA EXCISION  07/28/11   Dr Sarajane Jews; stomach and chest   REMOVAL OF STONES  12/07/2018   Procedure: REMOVAL OF STONES;  Surgeon: Irving Copas., MD;  Location: [facility];  Service: Gastroenterology;;   TONSILLECTOMY  1951   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   UPPER GASTROINTESTINAL ENDOSCOPY  1983   hiatal hernia   VASECTOMY  1979   Social History    Occupational History   Occupation: retired  Tobacco Use   Smoking status: Never   Smokeless tobacco: Never  Vaping Use   Vaping Use: Never used  Substance and Sexual Activity   Alcohol use: Not Currently    Comment: currently sober since 06/2020   Drug use: No   Sexual activity: Yes    Partners: Female    Comment: Having some issues with ED.  Would like provider's recommendation.

## 2021-03-27 ENCOUNTER — Ambulatory Visit: Payer: Medicare Other | Admitting: Neurology

## 2021-03-27 ENCOUNTER — Encounter: Payer: Self-pay | Admitting: Neurology

## 2021-03-27 ENCOUNTER — Other Ambulatory Visit: Payer: Self-pay

## 2021-03-27 VITALS — BP 161/82 | HR 54 | Ht 73.0 in | Wt 198.2 lb

## 2021-03-27 DIAGNOSIS — R413 Other amnesia: Secondary | ICD-10-CM

## 2021-03-27 DIAGNOSIS — G3184 Mild cognitive impairment, so stated: Secondary | ICD-10-CM | POA: Diagnosis not present

## 2021-03-27 NOTE — Progress Notes (Signed)
[facility] [address] [place]. [name] [address] [phone]       OFFICE FOLLOW-UP NOTE  [name] Date of Birth:  [birth year] Medical Record Number:  [id number]   HPI: Jeffrey Acosta is a 70 year old Caucasian male seen today for initial office follow-up visit following hospital admission for subarachnoid hemorrhage in January 2022.  History is obtained from the patient and his wife is accompanying him today as well as review of electronic medical records and personally reviewed pertinent imaging films in PACS.  He has past medical history of bipolar disorder, hypothyroidism, diverticulosis, gastroesophageal reflux disease, hyperlipidemia, history of pia, atrial fibrillation and alcohol and tobacco abuse.  He was admitted on 04/30/2020 to [facility] when he was found to collapsed suddenly at home.  CT scan of the head showed moderate to large amount of acute subarachnoid hemorrhage felt to be consistent with a ruptured aneurysm with small amount of bilateral intraventricular hemorrhage.  CT angiogram was negative for aneurysm or AV malformation.  Patient was managed conservatively and was doing well until 05/11/2020 when he wandered off from the inpatient room for approximately 2 hours.  Patient later reported he had gone to smoke a cigarette but he was found unresponsive and hypothermic outside on the hospital campus.  CT scan of the head showed further regression of the bilateral extra-axial hemorrhage with resolved intraventricular blood.  The patient's family subsequently had reported that the previous day they noticed change in mental status with aphasia and inattention to conversation and decreased responsiveness.  MRI scan of the brain was obtained which showed multiple small acute infarcts in the left basal ganglia, left periatrial temporal lobe, bilateral frontoparietal regions with no associated edema or mass-effect.  EEG showed moderate diffuse  encephalopathy nonspecific in etiology without seizures.  2D echo showed ejection fraction of 55 to 60%.  SARS coronavirus test was negative.  LDL was low at 23 mg percent and hemoglobin A1c was 3.9.  Urine drug screen was negative.  Patient was found to have temperature and elevated white count and started on Zosyn and vancomycin.  He was started on nimodipine for vasospasm prophylaxis.  He underwent diagnostic cerebral catheter angiogram by Dr. Kathyrn Sheriff which was negative for any aneurysm or AV malformation.  Patient informs me that he has atrial fibrillation but was only on aspirin prior to admission.  However I do not find documentation of this in the hospital records.  He had a Zio patch as an outpatient and he has a appointment to discuss results with cardiologist Dr. Audie Box later this week.  Review of his previous physician office visits mention atrial tachycardia diagnosed in December 2020 but no definite mention of A. fib was made.  Patient states is done well since discharge to home._Outpatient physical occupational speech therapy.  His speech is mostly back to normal though still has some occasional word hesitancy and word finding difficulties.  He wants to start driving.  Is fully independent in all activities of daily living.  He has no complaints. Update 10/23/2020: He returns for follow-up after last visit 3 months ago.  Patient denies any headaches or stroke or TIA symptoms.  His main complaint today is short-term memory which seems to be getting worse.  He has trouble remembering recent information but can remember things from the past well.  He still independent and manages her own affairs.  Complains of occasional feeling of lightheadedness and dizziness but that does not last long.  He also  complains of leg aches and pains occasionally feels his balance may also be affected but this does not stay long.  He does have family history of and her grandmother had dementia and worries about it.  He  did undergo a loop recorder insertion and so for paroxysmal A. fib has not yet been found.  He is tolerating aspirin well without bruising or bleeding.  Blood pressure usually well controlled today it is slightly elevated at 150/81. Update 03/27/2021 : He returns for follow-up after last visit 6 months ago.  Patient states his short-term memory and cognitive difficulties remain about unchanged.  He had lab work done for reversible causes of memory loss on 10/23/2020 which were all normal.  EEG on 11/18/2020 showed mild to 67 Hostettler range slowing but no epileptiform activity.  MRI scan of the brain on 11/02/2020 showed no acute abnormalities and stable appearance of bifrontal hygromas and mild changes of generalized atrophy and changes of small vessel disease.  Patient had a fall 2 weeks ago when he tripped on the steps of a new house.  He developed an abrasion on his leg and dislocated his thumb.  He had a CT scan of the head done on 03/14/2021 which showed stable appearance of bifrontal small hygromas and no acute abnormality.  He has had a loop recorder inserted by cardiology and so far atrial tachycardia has been found but no A. fib.  He has no new complaints today. ROS:   14 system review of systems is positive for fall, thumb dislocation, leg abrasion, memory loss, lightheadedness, dizziness, leg ache and pain, leg weakness altered consciousness, aphasia, weakness, confusion, word finding difficulties all other systems negative  PMH:  Past Medical History:  Diagnosis Date   Alcoholism in remission ([place])    in AA   Arrhythmia    atrial tachycardia   Bipolar affective disorder ([place])    Diverticulosis of colon (without mention of hemorrhage)    GERD (gastroesophageal reflux disease)    Gout of multiple sites    Hiatal hernia    Hx of migraines    Hydrocele    HYPERLIPIDEMIA 01/10/2009   Qualifier: Diagnosis of  By: Ronnald Ramp CMA, [place]    NMR Lipoprofile 2011 LDL could not be calculated  Due to TG  of 889  (811  / 750 ),  HDL 50 . Father MI @ 89 PGM CVA early 66s; PGF CVA @72     Hypothyroidism    Melanoma of skin, site unspecified 11/25/2012   07/2011 abdominal  Stage 1 Dr Bick Lacy [place] Dr Sarajane Jews    Osteopenia    Polyarthralgia    Status post placement of implantable loop recorder 08/26/2020   Stroke [facility])    Subarachnoid hemorrhage (HCC)     Social History:  Social History   Socioeconomic History   Marital status: Married    Spouse name: [name]   Number of children: 3   Years of education: Not on file   Highest education level: Not on file  Occupational History   Occupation: retired  Tobacco Use   Smoking status: Never   Smokeless tobacco: Never  Vaping Use   Vaping Use: Never used  Substance and Sexual Activity   Alcohol use: Not Currently    Comment: currently sober since 06/2020   Drug use: No   Sexual activity: Yes    Partners: Female    Comment: Having some issues with ED.  Would like provider's recommendation.    Other Topics Concern  Not on file  Social History Narrative   Lives with Wife, [name]   Right Handed   Drinks 4-5 cups caffeine/week   Social Determinants of Health   Financial Resource Strain: Not on file  Food Insecurity: Not on file  Transportation Needs: Not on file  Physical Activity: Not on file  Stress: Not on file  Social Connections: Not on file  Intimate Partner Violence: Not on file    Medications:   Current Outpatient Medications on File Prior to Visit  Medication Sig Dispense Refill   allopurinol (ZYLOPRIM) 300 MG tablet Take 1 tablet (300 mg total) by mouth daily. 90 tablet 1   aspirin EC 81 MG tablet Take 81 mg by mouth daily. Swallow whole.     b complex vitamins capsule Take 1 capsule by mouth daily.     Cholecalciferol (VITAMIN D) 50 MCG (2000 UT) tablet Take 2,000 Units by mouth daily.     esomeprazole (NEXIUM) 40 MG capsule TAKE 1 CAPSULE(40 MG) BY MOUTH DAILY BEFORE BREAKFAST 90 capsule 1   fluticasone (FLONASE) 50 MCG/ACT  nasal spray Place 2 sprays into both nostrils daily as needed for congestion.     folic acid (FOLVITE) 1 MG tablet Take 1 tablet (1 mg total) by mouth daily. 90 tablet 3   lamoTRIgine (LAMICTAL) 25 MG tablet Take 25 mg by mouth daily.     levothyroxine (SYNTHROID) 50 MCG tablet TAKE 1 TABLET(50 MCG) BY MOUTH DAILY BEFORE BREAKFAST 90 tablet 1   metoprolol succinate (TOPROL-XL) 100 MG 24 hr tablet TAKE 1 AND 1/2 TABLETS(150 MG) BY MOUTH DAILY WITH OR IMMEDIATELY FOLLOWING A MEAL 135 tablet 1   naltrexone (DEPADE) 50 MG tablet Take 50 mg by mouth daily.   12   QUEtiapine (SEROQUEL) 400 MG tablet Take 800 mg by mouth at bedtime.     thiamine 100 MG tablet TAKE 1 TABLET (100 MG TOTAL) BY MOUTH DAILY. 30 tablet 0   Thiamine HCl (VITAMIN B-1) 250 MG tablet Take 1 tablet (250 mg total) by mouth daily. 90 tablet 3   timolol (TIMOPTIC) 0.5 % ophthalmic solution Place 1 drop into both eyes 2 (two) times daily.     vitamin B-12 (CYANOCOBALAMIN) 500 MCG tablet Take 500 mcg by mouth daily.     No current facility-administered medications on file prior to visit.    Allergies:   Allergies  Allergen Reactions   Omnipaque [Iohexol] Hives     Desc: developed hives after injection; resolved with 50 mg benadryl given to pt.     Physical Exam General: well developed, well nourished elderly Caucasian male, seated, in no evident distress Head: head normocephalic and atraumatic.  Neck: supple with no carotid or supraclavicular bruits Cardiovascular: regular rate and rhythm, no murmurs Musculoskeletal: no deformity Skin:  no rash/petichiae Vascular:  Normal pulses all extremities Vitals:   03/27/21 1453  BP: (!) 161/82  Pulse: (!) 54   Neurologic Exam Mental Status: Awake and fully alert. Oriented to place and time. Recent and remote memory slightly diminished.  Attention span, concentration and fund of knowledge appropriate. Mood and affect appropriate.  Diminished recall 2/3.  Able to name 15 animals  which can walk on 4 legs.  Clock drawing 4/4.  Mini-Mental status exam score 28/30 with 1 deficit in recall and orientation.  Clock drawing 4/4.  Able to name 11 animals which can walk on 4 legs Cranial Nerves: Fundoscopic exam reveals sharp disc margins. Pupils equal, briskly reactive to light. Extraocular movements full  without nystagmus. Visual fields full to confrontation. Hearing intact. Facial sensation intact. Face, tongue, palate moves normally and symmetrically.  Motor: Normal bulk and tone. Normal strength in all tested extremity muscles. Sensory.: intact to touch ,pinprick .position and vibratory sensation.  Coordination: Rapid alternating movements normal in all extremities. Finger-to-nose and heel-to-shin performed accurately bilaterally. Gait and Station: Arises from chair without difficulty. Stance is normal. Gait demonstrates normal stride length and balance . Able to heel, toe and tandem walk with moderate difficulty.  Reflexes: 1+ and symmetric. Toes downgoing.    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  Doubt significant vasospasm contributing as it was not clearly documented on transcranial Doppler study and many of the infarcts were subcortical.  New complaints of mild short-term memory and cognitive difficulties likely from mild cognitive impairment. Which appear stable    PLAN: I had a long discussion with the patient regarding his memory loss and mild cognitive impairment which appears to  be stable.  Encouraged him to continue participation in cognitively challenging activities like solving crossword puzzles, playing bridge and sodoku.  We also discussed memory compensation strategies.  He was advised to stay on aspirin for stroke prevention and maintain aggressive risk factor modification and strict blood pressure control below 130/90, lipids with LDL cholesterol goal below 70 mg percent and diabetes with hemoglobin A1c goal below 6.5%.  Return for follow-up in the future in 6 months or call earlier if necessary. Greater than 50% of time during this 30-minute visit minute visit was spent on counseling,explanation of diagnosis of subarachnoid hemorrhage, memory loss and mild cognitive impairment and stroke risk, planning of further management, discussion with patient and family and coordination of care Antony Contras, MD Note: This document was prepared with digital dictation and possible smart phrase technology. Any transcriptional errors that result from this process are unintentional

## 2021-03-27 NOTE — Patient Instructions (Addendum)
I had a long discussion with the patient regarding his memory loss and mild cognitive impairment which appears to be stable.  Encouraged him to continue participation in cognitively challenging activities like solving crossword puzzles, playing bridge and sodoku.  We also discussed memory compensation strategies.  He was advised to stay on aspirin for stroke prevention and maintain aggressive risk factor modification and strict blood pressure control below 130/90, lipids with LDL cholesterol goal below 70 mg percent and diabetes with hemoglobin A1c goal below 6.5%.  Return for follow-up in the future in 6 months or call earlier if necessary. Memory Compensation Strategies  Use "WARM" strategy.  W= write it down  A= associate it  R= repeat it  M= make a mental note  2.   You can keep a Social worker.  Use a 3-ring notebook with sections for the following: calendar, important names and phone numbers,  medications, doctors' names/phone numbers, lists/reminders, and a section to journal what you did  each day.   3.    Use a calendar to write appointments down.  4.    Write yourself a schedule for the day.  This can be placed on the calendar or in a separate section of the Memory Notebook.  Keeping a  regular schedule can help memory.  5.    Use medication organizer with sections for each day or morning/evening pills.  You may need help loading it  6.    Keep a basket, or pegboard by the door.  Place items that you need to take out with you in the basket or on the pegboard.  You may also want to  include a message board for reminders.  7.    Use sticky notes.  Place sticky notes with reminders in a place where the task is performed.  For example: " turn off the  stove" placed by the stove, "lock the door" placed on the door at eye level, " take your medications" on  the bathroom mirror or by the place where you normally take your medications.  8.    Use alarms/timers.  Use while cooking to  remind yourself to check on food or as a reminder to take your medicine, or as a  reminder to make a call, or as a reminder to perform another task, etc.

## 2021-03-28 ENCOUNTER — Ambulatory Visit: Payer: Medicare Other | Admitting: Surgical

## 2021-03-28 ENCOUNTER — Other Ambulatory Visit: Payer: Self-pay

## 2021-03-28 ENCOUNTER — Encounter: Payer: Self-pay | Admitting: Orthopedic Surgery

## 2021-03-28 ENCOUNTER — Ambulatory Visit: Payer: Self-pay

## 2021-03-28 DIAGNOSIS — M25571 Pain in right ankle and joints of right foot: Secondary | ICD-10-CM | POA: Diagnosis not present

## 2021-03-28 NOTE — Progress Notes (Signed)
Office Visit Note   Patient: Jeffrey Acosta           Date of Birth: 04/07/1951           MRN: [id number] Visit Date: 03/28/2021 Requested by: Colon Branch, [address] [place],  [place] [address] PCP: Colon Branch, MD  Subjective: Chief Complaint  Patient presents with   Right Leg - Follow-up    HPI: Jeffrey Acosta is a 70 y.o. male who presents to the office complaining of right ankle and right calf pain.  Patient returns for follow-up visit regarding right calf injury.  He is here for suture removal today.  Sutures removed and right lateral calf wound seems to be healing well.  There is no significant surrounding erythema around the wound and the wound is continuing to drain but the drainage appears bloody and not purulent.  Denies any fevers, chills, night sweats.  No radiation of pain.  He does report recent onset of right ankle pain.  He initially noticed this after falling down the stairs several weeks ago which was his original injury but he was able to ignore this and put it on the back burner due to the other injuries he has dealt with as a result of that injury.  In the last week, the right ankle pain has become more and more constant and has gotten to the point where he is walking with a limp due to the severity of the pain.  He really only notices it when he is weightbearing.  Does not bother him at night or keep him up at night.  He has no history of right ankle pain prior to this.  No mechanical symptoms or clicking/catching of the ankle.  Ankle does not give out on him, just causes pain with ambulation..                ROS: All systems reviewed are negative as they relate to the chief complaint within the history of present illness.  Patient denies fevers or chills.  Assessment & Plan: Visit Diagnoses:  1. Pain in right ankle and joints of right foot     Plan: Patient is a 70 year old male who presents for suture removal from right lower extremity calf  wound as well as new evaluation of right ankle pain.  Wound seems to be healing well with no significant sign of infection today.  More pressing is his right ankle pain for him today.  He initially noticed this after his injury but has become worse in the last week.  Only bothers him with weightbearing.  On exam he has tenderness primarily over the anterior medial joint line of the right ankle and has pain and distal function of the ankle when he tries to perform single-leg heel raise which he cannot.  There is no significantly increased flatfoot deformity of the foot.  Discussed options available to patient including trial of fracture boot, orthotic, MRI of the ankle, doing nothing.  Patient would like to just ride this out for the next 2 weeks and see if it improves.  He will follow-up in 2 weeks for clinical recheck regarding his calf wound and ankle pain.  If he has continually worsening pain in the meantime he will either call the office or let Dr. Tempie Donning know when he sees him next week.  Patient agreed with plan.  Follow-up in 2 weeks.  Follow-Up Instructions: No follow-ups on file.  Orders:  Orders Placed This Encounter  Procedures   XR Ankle Complete Right   No orders of the defined types were placed in this encounter.     Procedures: No procedures performed   Clinical Data: No additional findings.  Objective: Vital Signs: There were no vitals taken for this visit.  Physical Exam:  Constitutional: Patient appears well-developed HEENT:  Head: Normocephalic Eyes:EOM are normal Neck: Normal range of motion Cardiovascular: Normal rate Pulmonary/chest: Effort normal Neurologic: Patient is alert Skin: Skin is warm Psychiatric: Patient has normal mood and affect  Ortho Exam: Ortho exam demonstrates right calf wound that looks to be healing well with no significant surrounding erythema.  There is some active and expressible drainage from the wound that is serosanguineous and not  purulent.  There is no significantly increased warmth around the wound or any fluctuance noted.  No calf tenderness.  Negative Homans' sign.  Palpable pedal pulse of the right ankle.  Intact dorsiflexion, plantarflexion, inversion, eversion without pain.  No tenderness over the medial or lateral malleoli, fifth metatarsal base, Lisfranc complex, Achilles tendon.  Most of his tenderness is over the anterior and anterior medial joint line of the ankle.  No tenderness over the insertion of the posterior tib tendon.  No tenderness over the ATFL, CFL, deltoid ligament.  Patient is not able to perform single-leg heel raise on the affected leg.  He can go up on his toes on both legs at the same time.  Specialty Comments:  No specialty comments available.  Imaging: No results found.   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[place]) 11/25/2012   Alcoholism in remission ([place]) 06/19/2011   Diverticulitis of colon (without mention of hemorrhage)(562.11) 06/19/2011   Bipolar I disorder ([place]) 06/19/2011   Osteopenia 05/17/2010   Alcohol abuse 05/16/2010   HYPERTRIGLYCERIDEMIA 08/02/2009   Macrocytic anemia 01/10/2009   ABNORMAL ELECTROCARDIOGRAM 11/26/2008   REFLUX, ESOPHAGEAL 11/17/2006   COMMON MIGRAINE 09/22/2006   Past Medical History:  Diagnosis Date   Alcoholism in remission ([place])    in AA   Arrhythmia    atrial tachycardia   Bipolar affective disorder ([place])     Diverticulosis of colon (without mention of hemorrhage)    GERD (gastroesophageal reflux disease)    Gout of multiple sites    Hiatal hernia    Hx of migraines    Hydrocele    HYPERLIPIDEMIA 01/10/2009   Qualifier: Diagnosis of  By: Ronnald Ramp CMA, Chemira    NMR Lipoprofile 2011 LDL could not be calculated  Due to TG of 889  (811  / 750 ),  HDL 50 . Father MI @ 48 PGM CVA early 4s; PGF CVA @72     Hypothyroidism    Melanoma of skin, site unspecified 11/25/2012   07/2011 abdominal  Stage 1 Dr Degracia Lacy [place] Dr Sarajane Jews    Osteopenia    Polyarthralgia    Status post placement of implantable loop recorder 08/26/2020   Stroke [facility])    Subarachnoid  hemorrhage ([place])     Family History  Problem Relation Age of Onset   COPD Father    Heart attack Father 77   COPD Mother    Diabetes Paternal Grandmother    Parkinsonism Paternal Grandmother    Stroke Paternal Grandmother 40   Stroke Maternal Grandfather 60   Lung cancer Paternal Grandfather        Heavy smoker   Colon cancer Neg Hx    Prostate cancer Neg Hx    Esophageal cancer Neg Hx    Rectal cancer Neg Hx    Stomach cancer Neg Hx     Past Surgical History:  Procedure Laterality Date   BILIARY BRUSHING  12/07/2018   Procedure: BILIARY BRUSHING;  Surgeon: Rush Landmark, Telford Nab., MD;  Location: [facility];  Service: Gastroenterology;;   BILIARY DILATION  12/07/2018   Procedure: BILIARY DILATION;  Surgeon: Irving Copas., MD;  Location: [facility];  Service: Gastroenterology;;   BIOPSY  12/07/2018   Procedure: BIOPSY;  Surgeon: Irving Copas., MD;  Location: [facility];  Service: Gastroenterology;;   CHOLECYSTECTOMY N/A 01/29/2021   Procedure: LAPAROSCOPIC CHOLECYSTECTOMY WITH ICG DYE;  Surgeon: Rolm Bookbinder, MD;  Location: [facility];  Service: General;  Laterality: N/A;   COLONOSCOPY  2003   negative ; Dr Sharlett Iles   ERCP N/A 12/07/2018   Procedure: ENDOSCOPIC RETROGRADE CHOLANGIOPANCREATOGRAPHY (ERCP);  Surgeon:  Irving Copas., MD;  Location: [facility];  Service: Gastroenterology;  Laterality: N/A;   ESOPHAGOGASTRODUODENOSCOPY (EGD) WITH PROPOFOL N/A 12/07/2018   Procedure: ESOPHAGOGASTRODUODENOSCOPY (EGD) WITH PROPOFOL;  Surgeon: Rush Landmark Telford Nab., MD;  [facility];  Service: Gastroenterology;  Laterality: N/A;   EUS N/A 12/07/2018   Procedure: UPPER ENDOSCOPIC ULTRASOUND (EUS) RADIAL;  Surgeon: Irving Copas., MD;  [facility];  Service: Gastroenterology;  Laterality: N/A;   INGUINAL HERNIA REPAIR  08/07/2011   Procedure: HERNIA REPAIR INGUINAL ADULT;  Surgeon: Rolm Bookbinder, MD;  Location: [facility];  Service: General;  Laterality: Right;   IR ANGIO INTRA EXTRACRAN SEL INTERNAL CAROTID BILAT MOD SED  05/14/2020   IR ANGIO VERTEBRAL SEL VERTEBRAL BILAT MOD SED  05/14/2020   IR GUIDED DRAIN W CATHETER PLACEMENT  02/05/2021   IR RADIOLOGIST EVAL & MGMT  02/13/2021   LOOP RECORDER INSERTION     06/2020   MELANOMA EXCISION  07/28/11   Dr Sarajane Jews; stomach and chest   REMOVAL OF STONES  12/07/2018   Procedure: REMOVAL OF STONES;  Surgeon: Irving Copas., MD;  [facility];  Service: Gastroenterology;;   TONSILLECTOMY  1951   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   UPPER GASTROINTESTINAL ENDOSCOPY  1983   hiatal hernia   VASECTOMY  1979   Social History   Occupational History   Occupation: retired  Tobacco Use   Smoking status: Never   Smokeless tobacco: Never  Vaping Use   Vaping Use: Never used  Substance and Sexual Activity   Alcohol use: Not Currently    Comment: currently sober since 06/2020   Drug use: No   Sexual activity: Yes    Partners: Female    Comment: Having some issues with ED.  Would like provider's recommendation.

## 2021-04-02 ENCOUNTER — Ambulatory Visit: Payer: Medicare Other | Admitting: Orthopedic Surgery

## 2021-04-02 ENCOUNTER — Other Ambulatory Visit: Payer: Self-pay

## 2021-04-03 ENCOUNTER — Ambulatory Visit: Payer: Medicare Other | Admitting: Orthopedic Surgery

## 2021-04-03 ENCOUNTER — Ambulatory Visit (INDEPENDENT_AMBULATORY_CARE_PROVIDER_SITE_OTHER): Payer: Medicare Other

## 2021-04-03 ENCOUNTER — Encounter (HOSPITAL_COMMUNITY): Payer: Self-pay | Admitting: Orthopedic Surgery

## 2021-04-03 DIAGNOSIS — S63124A Dislocation of unspecified interphalangeal joint of right thumb, initial encounter: Secondary | ICD-10-CM

## 2021-04-03 DIAGNOSIS — M79644 Pain in right finger(s): Secondary | ICD-10-CM

## 2021-04-03 NOTE — Progress Notes (Signed)
Office Visit Note   Patient: Jeffrey Acosta           Date of Birth: 24-Apr-1951           MRN: [id number] Visit Date: 04/03/2021              Requested by: Colon Branch, MD [address] [place],  [place] [address] PCP: Colon Branch, MD   Assessment & Plan: Visit Diagnoses:  1. Pain of right thumb   2. Closed dislocation of interphalangeal joint of right thumb, initial encounter     Plan: Patient is thumb IP joint remains dislocated today.  His splint was taken off at some point.  We discussed close reduction versus open reduction and pinning in the operating room.  At this point, is not sure he is interested in surgery.  We discussed that without surgery he will have limited to no motion at the thumb IP joint.  He may have chronic swelling in the joint.  He has had lots of medical issues this year including several strokes.  He is not sure the increase function at the IP joint will be meaningful for him.  For now, he wants to treat this nonoperatively and will call me this week if he changes his mind.  Follow-Up Instructions: No follow-ups on file.   Orders:  Orders Placed This Encounter  Procedures   XR Finger Thumb Right   No orders of the defined types were placed in this encounter.     Procedures: No procedures performed   Clinical Data: No additional findings.   Subjective: Chief Complaint  Patient presents with   Right Hand - Follow-up    Right thumb dislocation    This is a 70 yo RHD M who presents for follow up of a closed right thumb IP dislocation after falling down some stairs in late September.  He initially thought that he sprained his thumb.  His thumb was reduced last week and he was placed in an extension block splint with the IP joint reduced.  He is not wearing his splint today.  He has improved pain in the thumb with moderate swelling.     Review of Systems   Objective: Vital Signs: There were no vitals taken for this  visit.  Physical Exam  Right Hand Exam   Tenderness  Right hand tenderness location: TTP at thumb IP joint.  Other  Erythema: absent Sensation: normal Pulse: present  Comments:  No AROM at thumb IP joint.  Moderate swelling.      Specialty Comments:  No specialty comments available.  Imaging: 3V of the right thumb taken today are reviewed and interpreted by me.  They demonstrate continued dorsal disloaction of the thumb IP joint.    PMFS History: Patient Active Problem List   Diagnosis Date Noted   Closed dislocation of interphalangeal joint of right thumb 04/03/2021   Dyslipidemia 05/23/2020   Benign essential HTN 05/06/2020   Subarachnoid hemorrhage ([place]) 04/30/2020   Leukopenia 07/15/2018   Thrombocytopenia ([place]) 07/15/2018   PCP NOTES >>>>>>> 03/23/2015   Annual physical exam 03/20/2015   Bronchospasm 09/06/2014   DJD (degenerative joint disease) 09/06/2014   Hypothyroidism 08/28/2014   Tremor 07/29/2014   Gait disorder 07/29/2014   Gout 01/08/2014   Vitamin D deficiency 11/25/2012   Melanoma of skin, site unspecified 11/25/2012   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[place]) 11/25/2012   Alcoholism in remission ([place]) 06/19/2011   Diverticulitis of colon (without  mention of hemorrhage)(562.11) 06/19/2011   Bipolar I disorder ([place]) 06/19/2011   Osteopenia 05/17/2010   Alcohol abuse 05/16/2010   HYPERTRIGLYCERIDEMIA 08/02/2009   Macrocytic anemia 01/10/2009   ABNORMAL ELECTROCARDIOGRAM 11/26/2008   REFLUX, ESOPHAGEAL 11/17/2006   COMMON MIGRAINE 09/22/2006   Past Medical History:  Diagnosis Date   Alcoholism in remission ([place])    in AA   Arrhythmia    atrial tachycardia   Bipolar affective disorder ([place])    Diverticulosis of colon (without mention of hemorrhage)    GERD (gastroesophageal reflux disease)    Gout of multiple sites    Hiatal hernia    Hx of migraines    Hydrocele    HYPERLIPIDEMIA 01/10/2009   Qualifier: Diagnosis of  By: Ronnald Ramp CMA, Chemira     NMR Lipoprofile 2011 LDL could not be calculated  Due to TG of 889  (811  / 750 ),  HDL 50 . Father MI @ 31 PGM CVA early 14s; PGF CVA @72     Hypothyroidism    Melanoma of skin, site unspecified 11/25/2012   07/2011 abdominal  Stage 1 Dr Raetz Lacy [place] Dr Sarajane Jews    Osteopenia    Polyarthralgia    Status post placement of implantable loop recorder 08/26/2020   Stroke [facility])    Subarachnoid hemorrhage ([facility])     Family History  Problem Relation Age of Onset   COPD Father    Heart attack Father 107   COPD Mother    Diabetes Paternal Grandmother    Parkinsonism Paternal Grandmother    Stroke Paternal Grandmother 10   Stroke Maternal Grandfather 72   Lung cancer Paternal Grandfather        Heavy smoker   Colon cancer Neg Hx    Prostate cancer Neg Hx    Esophageal cancer Neg Hx    Rectal cancer Neg Hx    Stomach cancer Neg Hx     Past Surgical History:  Procedure Laterality Date   BILIARY BRUSHING  12/07/2018   Procedure: BILIARY BRUSHING;  Surgeon: Irving Copas., MD;  Location: [facility];  Service: Gastroenterology;;   BILIARY DILATION  12/07/2018   Procedure: BILIARY DILATION;  Surgeon: Irving Copas., MD;  Location: [facility];  Service: Gastroenterology;;   BIOPSY  12/07/2018   Procedure: BIOPSY;  Surgeon: Irving Copas., MD;  Location: [facility];  Service: Gastroenterology;;   CHOLECYSTECTOMY N/A 01/29/2021   Procedure: LAPAROSCOPIC CHOLECYSTECTOMY WITH ICG DYE;  Surgeon: Rolm Bookbinder, MD;  Location: [facility];  Service: General;  Laterality: N/A;   COLONOSCOPY  2003   negative ; Dr Sharlett Iles   ERCP N/A 12/07/2018   Procedure: ENDOSCOPIC RETROGRADE CHOLANGIOPANCREATOGRAPHY (ERCP);  Surgeon: Irving Copas., MD;  Location: [facility];  Service: Gastroenterology;  Laterality: N/A;   ESOPHAGOGASTRODUODENOSCOPY (EGD) WITH PROPOFOL N/A 12/07/2018   Procedure: ESOPHAGOGASTRODUODENOSCOPY (EGD) WITH PROPOFOL;  Surgeon: Rush Landmark Telford Nab., MD;   [facility];  Service: Gastroenterology;  Laterality: N/A;   EUS N/A 12/07/2018   Procedure: UPPER ENDOSCOPIC ULTRASOUND (EUS) RADIAL;  Surgeon: Irving Copas., MD;  [facility];  Service: Gastroenterology;  Laterality: N/A;   INGUINAL HERNIA REPAIR  08/07/2011   Procedure: HERNIA REPAIR INGUINAL ADULT;  Surgeon: Rolm Bookbinder, MD;  Location: [facility];  Service: General;  Laterality: Right;   IR ANGIO INTRA EXTRACRAN SEL INTERNAL CAROTID BILAT MOD SED  05/14/2020   IR ANGIO VERTEBRAL SEL VERTEBRAL BILAT MOD SED  05/14/2020   IR GUIDED DRAIN W CATHETER PLACEMENT  02/05/2021   IR RADIOLOGIST EVAL & MGMT  02/13/2021   LOOP RECORDER INSERTION     06/2020   MELANOMA EXCISION  07/28/11   Dr Sarajane Jews; stomach and chest   REMOVAL OF STONES  12/07/2018   Procedure: REMOVAL OF STONES;  Surgeon: Rush Landmark Telford Nab., MD;  [facility];  Service: Gastroenterology;;   TONSILLECTOMY  1951   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   UPPER GASTROINTESTINAL ENDOSCOPY  1983   hiatal hernia   VASECTOMY  1979   Social History   Occupational History   Occupation: retired  Tobacco Use   Smoking status: Never   Smokeless tobacco: Never  Vaping Use   Vaping Use: Never used  Substance and Sexual Activity   Alcohol use: Not Currently    Comment: currently sober since 06/2020   Drug use: No   Sexual activity: Yes    Partners: Female    Comment: Having some issues with ED.  Would like provider's recommendation.

## 2021-04-03 NOTE — Progress Notes (Signed)
DUE TO COVID-19 ONLY ONE VISITOR IS ALLOWED TO COME WITH YOU AND STAY IN THE WAITING ROOM ONLY DURING PRE OP AND PROCEDURE DAY OF SURGERY.   PCP - Dr Kathlene November Cardiologist - Dr Lake Bells O'Neal Gastrology- Dr Scarlette Shorts Neurology - Dr Antony Contras  Chest x-ray - n/a EKG - 05/08/20 Stress Test - Greater than 20 yrs ago ECHO - 05/11/20 Cardiac Cath - n/a  Loop - [employer] LOOP Recorder.  Last remote check was on 03/12/21.  Sleep Study -  n/a CPAP - none  Aspirin Instructions: Follow your surgeon's instructions on when to stop aspirin prior to surgery,  If no instructions were given by your surgeon then you will need to call the office for those instructions.  Anesthesia review: Yes  STOP now taking any Aspirin (unless otherwise instructed by your surgeon), Aleve, Naproxen, Ibuprofen, Motrin, Advil, Goody's, BC's, all herbal medications, fish oil, and all vitamins.   Coronavirus Screening Covid test n/a Ambulatory Surgery  Do you have any of the following symptoms:  Cough yes/no: No Fever (>100.85F)  yes/no: No Runny nose yes/no: No Sore throat yes/no: No Difficulty breathing/shortness of breath  yes/no: No  Have you traveled in the last 14 days and where? yes/no: No  Patient verbalized understanding of instructions that were given via phone.

## 2021-04-03 NOTE — Progress Notes (Signed)
Anesthesia Chart Review: Jeffrey Acosta  Case: [id number] Date/Time: 04/04/21 1316   Procedure: CLOSED REDUCTION WITH PERCUTANEOUS PINNING RIGHT THUMB (Right: Finger)   Anesthesia type: Choice   Pre-op diagnosis: Closed Dislocation of Interphalangeal Joint Right Thumb   Location: [facility] OR ROOM 05 / [place] OR   Surgeons: Sherilyn Cooter, MD       DISCUSSION:  Patient is a 70 year old male scheduled for the above procedure. He fell down 15 stairs on 03/13/21 and sustained right leg laceration small non-displaced fracture of the right fibula. Ortho recommended nylon sutures of closes, CAM walker, WBAT and office follow-up. Tdap UTD. Head CT showed chronic bilateral hygromas. He also had right thumb pain, but appears this was not communicated to orthopedics until second follow-up visit with Marcene Duos, MD on 03/24/21. Right thumb with IP joint dislocation that was reduced under digital block, but "a slightly unstable reduction". Splint applied and referred to Dr. Tempie Donning who performed re-reduction on 03/25/21. Noted to be dislocated at 04/03/21 follow-up and decision made to proceed with above procedure.    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   Patient last evaluated by cardiologist Dr. Audie Box on 01/21/2021 for preoperative evaluation prior to cholecystectomy.  He has known history of atrial tachycardia. No afib on monitor. On b-blocker. Unremarkable echo. , well controlled on metoprolol succinate. Unremarkable echo and able to walk 2-3 miles a  few times a week. He was felt to be acceptable risk for that procedure.  Patient will be evaluated by his anesthesia team on the day of surgery.    VS:  BP Readings from Last 3 Encounters:  03/27/21 (!) 161/82  03/19/21 116/80  03/14/21 (!) 140/92   Pulse Readings from Last 3 Encounters:  03/27/21 (!) 54  03/19/21 60  03/14/21 79     PROVIDERS: Colon Branch, MD is PCP Eleonore Chiquito, MD is cardiologist Croitoru, Dani Gobble, MD is cardiologist (loop recorder) Antony Contras, MD is neurologist. Last visit 03/27/21.  Burney Gauze, MD is hematologist. 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  He was currently sober with counts overall improved.  As needed follow-up with ongoing monitoring by primary care. Melony Overly, MD is ENT Kristeen Miss, MD is neurosurgeon Rolm Bookbinder, MD is general surgeon   LABS: For day of surgery as indicated. As of 01/22/21, Cr 1.25, AST 26, ALT 21, H/H 14.1/39.9, PLT 145K.   OTHER: EEG 11/18/20: Summary  This is a mildly abnormal EEG due to the presence of mild bihemispheric slowing which is a nonspecific finding seen in a variety of toxic, metabolic, hypoxic and infectious etiologies.  No definite epileptiform activity is noted.    EGD 10/08/20:  Impression: 1. 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 GERD 3. Otherwise unremarkable EGD.     IMAGES: Xray right Tibia/Fibula 03/14/21:  IMPRESSION: 1. Small, nondisplaced fracture involving the mid shaft of the right fibula. 2. Soft tissue laceration along the lateral aspect of the mid right calf.   CT Head 03/14/21: IMPRESSION: 1. Chronic bilateral subdural hygromas, left larger than right. 2. No acute intracranial abnormality.  MRI Brain 11/02/20: IMPRESSION: 1.   There were no acute findings and there was a normal enhancement pattern. 2.   Mild to moderate generalized  cortical atrophy, stable compared to the 05/11/2020 MRI. 3.   Bilateral hygromas over the frontal convexities, left greater than right.  These appear stable compared to the 05/11/2020 MRI. 4.   Resolution of the occipital parafalcine, left insular and perimesencephalic cistern subarachnoid hemorrhages noted on the 05/11/2020 MRI. 5.   Scattered T2/FLAIR hyperintense foci in the hemispheres including the left internal capsule/basal ganglia and 2 gray matter foci in the left parietal lobe.  Some of these foci had an acute on the 05/11/2020 MRI.  There are no acute foci in the current MRI.  The foci are consistent with prior small strokes and chronic microvascular ischemic changes. 6.   Right mastoid effusion.  This could be due to eustachian tube dysfunction. 7.   Mild basilar dolichoectasia.   MRA Brain 11/02/20: IMPRESSION: 1.   Mild stenosis in the clinoid segment of the left internal carotid artery, the distal M1 segment of the left middle cerebral artery and posterior cerebral arteries.  None of these appears hemodynamically significant.  The mild middle cerebral artery and posterior cerebral artery stenosis was not identified on the CT angiogram performed 05/01/2020 and could be artifactual. 2.   No aneurysms.   Diagnostic cerebral angiogram 05/14/20: FINDINGS (Full report in [facility]): 1. No intracranial aneurysms, AVMs, or fistulas seen 2. Moderate to severe vasospasm of bilateral anterior circulation, and mild spasm of the left PCA territory.   CTA neck 05/01/20 showed mild for age atheromatous change about the carotid bifurcations and carotid siphons without hemodynamically significant stenosis.     EKG: EKG on 05/08/20 showed limb lead reversal suspected.    EKG 04/30/20:  Sinus rhythm with 1st degree A-V block Left axis deviation Moderate voltage criteria for LVH, may be normal variant ( R in aVL , Cornell product ) Septal infarct , age undetermined Abnormal ECG No significant change since  last tracing Confirmed by Gareth Morgan [id number]) on 05/01/2020 12:13:59 AM     CV: Long term cardiac monitor 05/22/20-06/03/20:  Patient had a min HR of 48 bpm (sinus bradycardia), max HR of 200 bpm (supraventricular tachycardia), and avg HR of 74 bpm (normal sinus rhythm). Predominant underlying rhythm was Sinus Rhythm. 340 Supraventricular Tachycardia runs occurred, the run with the fastest interval lasting 17 beats (7.1 seconds) with a max rate of 200 bpm, the longest lasting 34.1 secs with an avg rate of 152 bpm. Isolated SVEs were occasional (3.2%, 30865), SVE Couplets were rare (<1.0%, 917), and SVE Triplets were rare (<1.0%, 175). Isolated VEs were occasional (1.3%, 17170), VE Couplets were rare (<1.0%, 146), and VE Triplets were rare (<1.0%, 6). Ventricular Bigeminy and Trigeminy were present. No diary submitted.    Impression; 1. Frequent ectopic atrial tachycardia episodes detected (340 in 12 days; longest 34.1 seconds).  2. Occasional PACs (3.2% burden). 3. Occasional PVCs (1.3% burden).     Transcranial Doppler 05/12/20: Summary:  Slightly low basilar and left middle cerebral artery mean flow velocities  suggest distal stenosis otherwise normal mean flow velocitie sin remaining  identified vessels of anterior and postererior cerebral circulation.No  evidence of vasospasm noted.       Echo 05/11/20: IMPRESSIONS   1. Left ventricular ejection fraction, by estimation, is 55 to 60%. The  left ventricle has normal function. The left ventricle has no regional  wall motion abnormalities. There is moderate left  ventricular hypertrophy.  Left ventricular diastolic  parameters are consistent with Grade I diastolic dysfunction (impaired  relaxation).   2. Right ventricular systolic function is mildly reduced. The right  ventricular size is mildly enlarged.   3. Left atrial size was mildly dilated.   4. The mitral valve is normal in structure. Trivial mitral valve  regurgitation. No  evidence of mitral stenosis.   5. The aortic valve is tricuspid. Aortic valve regurgitation is mild.  Mild aortic valve stenosis.   6. Aortic dilatation noted. There is mild dilatation of the aortic root,  measuring 41 mm.   7. The inferior vena cava is normal in size with greater than 50%  respiratory variability, suggesting right atrial pressure of 3 mmHg.      US Carotid 09/12/14: IMPRESSION: Mild atherosclerotic disease in the carotid arteries bilaterally. Estimated degree of stenosis in the internal carotid arteries is less than 50% bilaterally. Patent vertebral arteries.     Reported a stress test > 20 years ago.   Past Medical History:  Diagnosis Date   Alcoholism in remission ([place])    in AA   Arrhythmia    atrial tachycardia   Bipolar affective disorder ([place])    Diverticulosis of colon (without mention of hemorrhage)    GERD (gastroesophageal reflux disease)    Gout of multiple sites    Hiatal hernia    Hx of migraines    Hydrocele    HYPERLIPIDEMIA 01/10/2009   Qualifier: Diagnosis of  By: Ronnald Ramp CMA, Chemira    NMR Lipoprofile 2011 LDL could not be calculated  Due to TG of 889  (811  / 750 ),  HDL 50 . Father MI @ 82 PGM CVA early 88s; PGF CVA @72     Hypothyroidism    Melanoma of skin, site unspecified 11/25/2012   07/2011 abdominal  Stage 1 Dr Ammons Lacy [place] Dr Sarajane Jews    Osteopenia    Polyarthralgia    Status post placement of implantable loop recorder 08/26/2020   Stroke [facility])    Subarachnoid hemorrhage [facility])     Past Surgical History:  Procedure Laterality Date   BILIARY BRUSHING  12/07/2018   Procedure: BILIARY BRUSHING;  Surgeon: Irving Copas., MD;  Location: [facility];  Service: Gastroenterology;;   BILIARY DILATION  12/07/2018   Procedure: BILIARY DILATION;  Surgeon: Irving Copas., MD;  Location: [facility];  Service: Gastroenterology;;   BIOPSY  12/07/2018   Procedure: BIOPSY;  Surgeon: Irving Copas., MD;  Location: [facility];  Service: Gastroenterology;;   CHOLECYSTECTOMY N/A 01/29/2021   Procedure: LAPAROSCOPIC CHOLECYSTECTOMY WITH ICG DYE;  Surgeon: Rolm Bookbinder, MD;  Location: [place];  Service: General;  Laterality: N/A;   COLONOSCOPY  2003   negative ; Dr Sharlett Iles   ERCP N/A 12/07/2018   Procedure: ENDOSCOPIC RETROGRADE CHOLANGIOPANCREATOGRAPHY (ERCP);  Surgeon: Irving Copas., MD;  Location: [facility];  Service: Gastroenterology;  Laterality: N/A;   ESOPHAGOGASTRODUODENOSCOPY (EGD) WITH PROPOFOL N/A 12/07/2018   Procedure: ESOPHAGOGASTRODUODENOSCOPY (EGD) WITH PROPOFOL;  Surgeon: Rush Landmark Telford Nab., MD;  Location: [facility];  Service: Gastroenterology;  Laterality: N/A;   EUS N/A 12/07/2018   Procedure: UPPER ENDOSCOPIC ULTRASOUND (EUS) RADIAL;  Surgeon: Irving Copas., MD;  [facility];  Service: Gastroenterology;  Laterality: N/A;   INGUINAL HERNIA REPAIR  08/07/2011   Procedure: HERNIA REPAIR INGUINAL ADULT;  Surgeon: Rolm Bookbinder, MD;  Location: [place];  Service: General;  Laterality: Right;   IR ANGIO INTRA EXTRACRAN SEL INTERNAL  CAROTID BILAT MOD SED  05/14/2020   IR ANGIO VERTEBRAL SEL VERTEBRAL BILAT MOD SED  05/14/2020   IR GUIDED DRAIN W CATHETER PLACEMENT  02/05/2021   IR RADIOLOGIST EVAL & MGMT  02/13/2021   LOOP RECORDER INSERTION     06/2020   MELANOMA EXCISION  07/28/11   Dr Sarajane Jews; stomach and chest   REMOVAL OF STONES  12/07/2018   Procedure: REMOVAL OF STONES;  Surgeon: Rush Landmark Telford Nab., MD;  Location: [facility];  Service: Gastroenterology;;   TONSILLECTOMY  1951   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   UPPER GASTROINTESTINAL ENDOSCOPY  1983   hiatal hernia   VASECTOMY  1979    MEDICATIONS: No current facility-administered medications for this encounter.    allopurinol (ZYLOPRIM) 300 MG tablet   aspirin EC 81 MG tablet   b complex vitamins capsule   Cholecalciferol (VITAMIN D) 50 MCG  (2000 UT) tablet   esomeprazole (NEXIUM) 40 MG capsule   fluticasone (FLONASE) 50 MCG/ACT nasal spray   folic acid (FOLVITE) 1 MG tablet   lamoTRIgine (LAMICTAL) 25 MG tablet   levothyroxine (SYNTHROID) 50 MCG tablet   metoprolol succinate (TOPROL-XL) 100 MG 24 hr tablet   naltrexone (DEPADE) 50 MG tablet   QUEtiapine (SEROQUEL) 400 MG tablet   thiamine 100 MG tablet   Thiamine HCl (VITAMIN B-1) 250 MG tablet   timolol (TIMOPTIC) 0.5 % ophthalmic solution   vitamin B-12 (CYANOCOBALAMIN) 500 MCG tablet    Myra Gianotti, PA-C Surgical Short Stay/Anesthesiology [facility] Phone [phone] [facility] Phone [phone] 04/03/2021 4:53 PM

## 2021-04-03 NOTE — Anesthesia Preprocedure Evaluation (Addendum)
Anesthesia Evaluation  Patient identified by MRN, date of birth, ID band Patient awake    Reviewed: Allergy & Precautions, NPO status , Patient's Chart, lab work & pertinent test results  Airway Mallampati: II  TM Distance: >3 FB Neck ROM: Full    Dental  (+) Dental Advisory Given   Pulmonary neg pulmonary ROS,    breath sounds clear to auscultation       Cardiovascular hypertension, Pt. on medications  Rhythm:Regular Rate:Normal     Neuro/Psych  Headaches, CVA    GI/Hepatic Neg liver ROS, hiatal hernia, GERD  ,  Endo/Other  Hypothyroidism   Renal/GU negative Renal ROS     Musculoskeletal  (+) Arthritis ,   Abdominal   Peds  Hematology negative hematology ROS (+)   Anesthesia Other Findings   Reproductive/Obstetrics                             Anesthesia Physical Anesthesia Plan  ASA: 3  Anesthesia Plan: General   Post-op Pain Management: Minimal or no pain anticipated and Tylenol PO (pre-op)   Induction:   PONV Risk Score and Plan: 2 and Dexamethasone, Ondansetron and Treatment may vary due to age or medical condition  Airway Management Planned: LMA  Additional Equipment:   Intra-op Plan:   Post-operative Plan: Extubation in OR  Informed Consent: I have reviewed the patients History and Physical, chart, labs and discussed the procedure including the risks, benefits and alternatives for the proposed anesthesia with the patient or authorized representative who has indicated his/her understanding and acceptance.     Dental advisory given  Plan Discussed with: CRNA  Anesthesia Plan Comments: ( )       Anesthesia Quick Evaluation

## 2021-04-04 ENCOUNTER — Ambulatory Visit (HOSPITAL_COMMUNITY)
Admission: RE | Admit: 2021-04-04 | Discharge: 2021-04-04 | Disposition: A | Discharge status: Home / Self Care | Payer: Medicare Other | Source: Ambulatory Visit | Attending: Orthopedic Surgery | Admitting: Orthopedic Surgery

## 2021-04-04 ENCOUNTER — Ambulatory Visit (HOSPITAL_COMMUNITY): Payer: Medicare Other | Admitting: Vascular Surgery

## 2021-04-04 ENCOUNTER — Encounter: Payer: Self-pay | Admitting: Orthopedic Surgery

## 2021-04-04 ENCOUNTER — Other Ambulatory Visit: Payer: Self-pay

## 2021-04-04 ENCOUNTER — Encounter (HOSPITAL_COMMUNITY): Payer: Self-pay | Admitting: Orthopedic Surgery

## 2021-04-04 ENCOUNTER — Encounter (HOSPITAL_COMMUNITY)
Admission: RE | Disposition: A | Discharge status: Home / Self Care | Payer: Self-pay | Source: Ambulatory Visit | Attending: Orthopedic Surgery

## 2021-04-04 ENCOUNTER — Telehealth: Payer: Self-pay | Admitting: *Deleted

## 2021-04-04 ENCOUNTER — Ambulatory Visit (HOSPITAL_COMMUNITY): Payer: Medicare Other

## 2021-04-04 ENCOUNTER — Ambulatory Visit: Payer: Medicare Other | Admitting: Orthopedic Surgery

## 2021-04-04 DIAGNOSIS — E559 Vitamin D deficiency, unspecified: Secondary | ICD-10-CM | POA: Diagnosis not present

## 2021-04-04 DIAGNOSIS — W109XXA Fall (on) (from) unspecified stairs and steps, initial encounter: Secondary | ICD-10-CM | POA: Diagnosis not present

## 2021-04-04 DIAGNOSIS — F319 Bipolar disorder, unspecified: Secondary | ICD-10-CM | POA: Diagnosis not present

## 2021-04-04 DIAGNOSIS — E039 Hypothyroidism, unspecified: Secondary | ICD-10-CM | POA: Insufficient documentation

## 2021-04-04 DIAGNOSIS — I471 Supraventricular tachycardia: Secondary | ICD-10-CM | POA: Insufficient documentation

## 2021-04-04 DIAGNOSIS — Z8673 Personal history of transient ischemic attack (TIA), and cerebral infarction without residual deficits: Secondary | ICD-10-CM | POA: Insufficient documentation

## 2021-04-04 DIAGNOSIS — S63104A Unspecified dislocation of right thumb, initial encounter: Secondary | ICD-10-CM | POA: Diagnosis not present

## 2021-04-04 DIAGNOSIS — K219 Gastro-esophageal reflux disease without esophagitis: Secondary | ICD-10-CM | POA: Insufficient documentation

## 2021-04-04 DIAGNOSIS — M25571 Pain in right ankle and joints of right foot: Secondary | ICD-10-CM | POA: Diagnosis not present

## 2021-04-04 DIAGNOSIS — S63124A Dislocation of unspecified interphalangeal joint of right thumb, initial encounter: Secondary | ICD-10-CM | POA: Insufficient documentation

## 2021-04-04 DIAGNOSIS — Z85820 Personal history of malignant melanoma of skin: Secondary | ICD-10-CM | POA: Insufficient documentation

## 2021-04-04 DIAGNOSIS — M109 Gout, unspecified: Secondary | ICD-10-CM | POA: Diagnosis not present

## 2021-04-04 DIAGNOSIS — I10 Essential (primary) hypertension: Secondary | ICD-10-CM | POA: Diagnosis not present

## 2021-04-04 HISTORY — PX: CLOSED REDUCTION FINGER WITH PERCUTANEOUS PINNING: SHX5612

## 2021-04-04 LAB — COMPREHENSIVE METABOLIC PANEL
ALT: 10 U/L (ref 0–44)
AST: 18 U/L (ref 15–41)
Albumin: 3.7 g/dL (ref 3.5–5.0)
Alkaline Phosphatase: 73 U/L (ref 38–126)
Anion gap: 7 (ref 5–15)
BUN: 18 mg/dL (ref 8–23)
CO2: 24 mmol/L (ref 22–32)
Calcium: 9.5 mg/dL (ref 8.9–10.3)
Chloride: 106 mmol/L (ref 98–111)
Creatinine, Ser: 1.12 mg/dL (ref 0.61–1.24)
GFR, Estimated: >60 mL/min (ref >60)
Glucose, Bld: 94 mg/dL (ref 70–99)
Potassium: 4.4 mmol/L (ref 3.5–5.1)
Sodium: 137 mmol/L (ref 135–145)
Total Bilirubin: 0.7 mg/dL (ref 0.3–1.2)
Total Protein: 6.3 g/dL — ABNORMAL LOW (ref 6.5–8.1)

## 2021-04-04 LAB — CBC
HCT: 36.2 % — ABNORMAL LOW (ref 39.0–52.0)
Hemoglobin: 12.7 g/dL — ABNORMAL LOW (ref 13.0–17.0)
MCH: 34.9 pg — ABNORMAL HIGH (ref 26.0–34.0)
MCHC: 35.1 g/dL (ref 30.0–36.0)
MCV: 99.5 fL (ref 80.0–100.0)
Platelets: 261 10*3/uL (ref 150–400)
RBC: 3.64 MIL/uL — ABNORMAL LOW (ref 4.22–5.81)
RDW: 13.7 % (ref 11.5–15.5)
WBC: 6.5 10*3/uL (ref 4.0–10.5)
nRBC: 0 % (ref 0.0–0.2)

## 2021-04-04 SURGERY — CLOSED REDUCTION, FINGER, WITH PERCUTANEOUS PINNING
Anesthesia: General | Site: Finger | Laterality: Right

## 2021-04-04 MED ORDER — DEXMEDETOMIDINE (PRECEDEX) IN NS 20 MCG/5ML (4 MCG/ML) IV SYRINGE
PREFILLED_SYRINGE | INTRAVENOUS | Status: DC | PRN
  Administered 2021-04-04: 20 ug via INTRAVENOUS

## 2021-04-04 MED ORDER — DEXAMETHASONE SODIUM PHOSPHATE 10 MG/ML IJ SOLN
INTRAMUSCULAR | Status: DC | PRN
  Administered 2021-04-04: 10 mg via INTRAVENOUS

## 2021-04-04 MED ORDER — CEFAZOLIN SODIUM-DEXTROSE 2-4 GM/100ML-% IV SOLN
2.0000 g | INTRAVENOUS | Status: AC
  Administered 2021-04-04: 2 g via INTRAVENOUS
  Filled 2021-04-04: qty 100

## 2021-04-04 MED ORDER — HYDRALAZINE HCL 20 MG/ML IJ SOLN
INTRAMUSCULAR | Status: AC
  Filled 2021-04-04: qty 1

## 2021-04-04 MED ORDER — PROPOFOL 10 MG/ML IV BOLUS
INTRAVENOUS | Status: AC
  Filled 2021-04-04: qty 20

## 2021-04-04 MED ORDER — LIDOCAINE 2% (20 MG/ML) 5 ML SYRINGE
INTRAMUSCULAR | Status: AC
  Filled 2021-04-04: qty 5

## 2021-04-04 MED ORDER — OXYCODONE HCL 5 MG PO TABS: 5.0000 mg | ORAL_TABLET | ORAL | 0 refills | Status: AC | PRN

## 2021-04-04 MED ORDER — FENTANYL CITRATE (PF) 100 MCG/2ML IJ SOLN
INTRAMUSCULAR | Status: AC
  Filled 2021-04-04: qty 2

## 2021-04-04 MED ORDER — CELECOXIB 100 MG PO CAPS: 100.0000 mg | ORAL_CAPSULE | Freq: Two times a day (BID) | ORAL | 0 refills | Status: DC

## 2021-04-04 MED ORDER — PROPOFOL 10 MG/ML IV BOLUS
INTRAVENOUS | Status: DC | PRN
  Administered 2021-04-04: 150 mg via INTRAVENOUS
  Administered 2021-04-04: 50 mg via INTRAVENOUS
  Administered 2021-04-04: 200 mg via INTRAVENOUS

## 2021-04-04 MED ORDER — ORAL CARE MOUTH RINSE: 15.0000 mL | Freq: Once | OROMUCOSAL | Status: AC

## 2021-04-04 MED ORDER — CHLORHEXIDINE GLUCONATE 0.12 % MT SOLN
15.0000 mL | Freq: Once | OROMUCOSAL | Status: AC
  Administered 2021-04-04: 15 mL via OROMUCOSAL
  Filled 2021-04-04: qty 15

## 2021-04-04 MED ORDER — LACTATED RINGERS IV SOLN: INTRAVENOUS | Status: DC

## 2021-04-04 MED ORDER — ONDANSETRON HCL 4 MG/2ML IJ SOLN
INTRAMUSCULAR | Status: AC
  Filled 2021-04-04: qty 2

## 2021-04-04 MED ORDER — ONDANSETRON HCL 4 MG/2ML IJ SOLN
INTRAMUSCULAR | Status: DC | PRN
  Administered 2021-04-04: 4 mg via INTRAVENOUS

## 2021-04-04 MED ORDER — ACETAMINOPHEN 500 MG PO TABS
1000.0000 mg | ORAL_TABLET | Freq: Once | ORAL | Status: AC
  Administered 2021-04-04: 1000 mg via ORAL
  Filled 2021-04-04: qty 2

## 2021-04-04 MED ORDER — LIDOCAINE 2% (20 MG/ML) 5 ML SYRINGE
INTRAMUSCULAR | Status: DC | PRN
  Administered 2021-04-04: 80 mg via INTRAVENOUS

## 2021-04-04 MED ORDER — HYDRALAZINE HCL 20 MG/ML IJ SOLN
10.0000 mg | Freq: Once | INTRAMUSCULAR | Status: AC
  Administered 2021-04-04: 10 mg via INTRAVENOUS

## 2021-04-04 MED ORDER — 0.9 % SODIUM CHLORIDE (POUR BTL) OPTIME
TOPICAL | Status: DC | PRN
  Administered 2021-04-04: 1000 mL

## 2021-04-04 MED ORDER — AMISULPRIDE (ANTIEMETIC) 5 MG/2ML IV SOLN: 10.0000 mg | Freq: Once | INTRAVENOUS | Status: DC | PRN

## 2021-04-04 MED ORDER — FENTANYL CITRATE (PF) 100 MCG/2ML IJ SOLN
25.0000 ug | INTRAMUSCULAR | Status: DC | PRN
  Administered 2021-04-04 (×2): 50 ug via INTRAVENOUS

## 2021-04-04 MED ORDER — DEXAMETHASONE SODIUM PHOSPHATE 10 MG/ML IJ SOLN
INTRAMUSCULAR | Status: AC
  Filled 2021-04-04: qty 1

## 2021-04-04 SURGICAL SUPPLY — 41 items
APL SKNCLS STERI-STRIP NONHPOA (GAUZE/BANDAGES/DRESSINGS)
BAG COUNTER SPONGE SURGICOUNT (BAG) ×2 IMPLANT
BAG SPNG CNTER NS LX DISP (BAG) ×1
BENZOIN TINCTURE PRP APPL 2/3 (GAUZE/BANDAGES/DRESSINGS) IMPLANT
BLADE CLIPPER SURG (BLADE) IMPLANT
BNDG ELASTIC 3X5.8 VLCR STR LF (GAUZE/BANDAGES/DRESSINGS) ×2 IMPLANT
BNDG ELASTIC 4X5.8 VLCR STR LF (GAUZE/BANDAGES/DRESSINGS) IMPLANT
BNDG GAUZE ELAST 4 BULKY (GAUZE/BANDAGES/DRESSINGS) ×2 IMPLANT
COVER SURGICAL LIGHT HANDLE (MISCELLANEOUS) ×2 IMPLANT
CUFF TOURN SGL QUICK 18X4 (TOURNIQUET CUFF) IMPLANT
CUFF TOURN SGL QUICK 24 (TOURNIQUET CUFF)
CUFF TRNQT CYL 24X4X16.5-23 (TOURNIQUET CUFF) IMPLANT
DRAPE OEC MINIVIEW 54X84 (DRAPES) ×2 IMPLANT
DRSG EMULSION OIL 3X3 NADH (GAUZE/BANDAGES/DRESSINGS) IMPLANT
DRSG XEROFORM 1X8 (GAUZE/BANDAGES/DRESSINGS) ×1 IMPLANT
GAUZE SPONGE 4X4 12PLY STRL (GAUZE/BANDAGES/DRESSINGS) IMPLANT
GAUZE SPONGE 4X4 16PLY XRAY LF (GAUZE/BANDAGES/DRESSINGS) ×1 IMPLANT
GAUZE XEROFORM 1X8 LF (GAUZE/BANDAGES/DRESSINGS) IMPLANT
GLOVE SURG ORTHO LTX SZ8 (GLOVE) ×2 IMPLANT
GLOVE SURG UNDER POLY LF SZ8.5 (GLOVE) ×2 IMPLANT
GOWN STRL REUS W/ TWL LRG LVL3 (GOWN DISPOSABLE) ×2 IMPLANT
GOWN STRL REUS W/TWL LRG LVL3 (GOWN DISPOSABLE) ×2
K-WIRE SURGICAL 1.6X102 (WIRE) ×1 IMPLANT
KIT BASIN OR (CUSTOM PROCEDURE TRAY) ×2 IMPLANT
KIT TURNOVER KIT B (KITS) ×2 IMPLANT
NS IRRIG 1000ML POUR BTL (IV SOLUTION) ×2 IMPLANT
PACK ORTHO EXTREMITY (CUSTOM PROCEDURE TRAY) ×2 IMPLANT
PAD ARMBOARD 7.5X6 YLW CONV (MISCELLANEOUS) ×4 IMPLANT
PAD CAST 4YDX4 CTTN HI CHSV (CAST SUPPLIES) IMPLANT
PADDING CAST COTTON 4X4 STRL (CAST SUPPLIES) ×2
SOAP 2 % CHG 4 OZ (WOUND CARE) ×1 IMPLANT
SPLINT FIBERGLASS 4X30 (CAST SUPPLIES) ×1 IMPLANT
STRIP CLOSURE SKIN 1/2X4 (GAUZE/BANDAGES/DRESSINGS) IMPLANT
SUT ETHILON 4 0 P 3 18 (SUTURE) ×1 IMPLANT
SUT ETHILON 5 0 P 3 18 (SUTURE)
SUT NYLON ETHILON 5-0 P-3 1X18 (SUTURE) IMPLANT
SUT PROLENE 4 0 P 3 18 (SUTURE) IMPLANT
SUT VIC AB 4-0 PS2 18 (SUTURE) ×1 IMPLANT
TOWEL GREEN STERILE (TOWEL DISPOSABLE) ×2 IMPLANT
TOWEL GREEN STERILE FF (TOWEL DISPOSABLE) ×2 IMPLANT
WATER STERILE IRR 1000ML POUR (IV SOLUTION) ×1 IMPLANT

## 2021-04-04 NOTE — Brief Op Note (Signed)
04/04/2021  4:14 PM  PATIENT:  Jeffrey Acosta  70 y.o. male  PRE-OPERATIVE DIAGNOSIS:  Closed Dislocation of Interphalangeal Joint Right Thumb  POST-OPERATIVE DIAGNOSIS:  Closed Dislocation of Interphalangeal Joint Right Thumb  PROCEDURE:  Procedure(s): CLOSED REDUCTION WITH PERCUTANEOUS PINNING RIGHT THUMB (Right)  SURGEON:  Surgeon(s) and Role:    * Sherilyn Cooter, MD - Primary  PHYSICIAN ASSISTANT:   ASSISTANTS: none   ANESTHESIA:   general  EBL:  5 mL   BLOOD ADMINISTERED:none  DRAINS: none   LOCAL MEDICATIONS USED:  NONE  SPECIMEN:  No Specimen  DISPOSITION OF SPECIMEN:  N/A  COUNTS:  YES  TOURNIQUET:  * Missing tourniquet times found for documented tourniquets in log: [id number] *  DICTATION: .Dragon Dictation  PLAN OF CARE: Discharge to home after PACU  PATIENT DISPOSITION:  PACU - hemodynamically stable.   Delay start of Pharmacological VTE agent (>24hrs) due to surgical blood loss or risk of bleeding: not applicable

## 2021-04-04 NOTE — Progress Notes (Addendum)
BP elevated in short stay: 190/86, 189/95, 194/83 - Pulse 57. Patient verbalized that he didn't take Metoprolol 150 mg this morning. Per Dr. Gifford Shave verbal order, patient received Hydralazine 10 mg IV. Will continue to monitor.

## 2021-04-04 NOTE — Progress Notes (Signed)
Orthopedic Tech Progress Note Patient Details:  Jeffrey Acosta 07/02/1950 [id number]  Ortho Devices Type of Ortho Device: Arm sling Ortho Device/Splint Interventions: Ordered      Danton Sewer [name] 04/04/2021, 4:44 PM

## 2021-04-04 NOTE — Telephone Encounter (Signed)
Pt is scheduled at [facility] imaging for CT ankle on Monday Dec 12 at 230pm arrival at 210, I tried call pt no answer, no vm. Will try again Monday a.m

## 2021-04-04 NOTE — Progress Notes (Signed)
Office Visit Note   Patient: Jeffrey Acosta           Date of Birth: 1950/09/01           MRN: [id number] Visit Date: 04/04/2021 Requested by: Colon Branch, [address] [place],  [place] [id number] PCP: Colon Branch, MD  Subjective: Chief Complaint  Patient presents with   Right Ankle - Pain    HPI: Jeffrey Acosta is a 70 y.o. male who presents to the office complaining of right ankle pain.  Patient states that he is no better since last office visit.  Complains of increased swelling in the ankle that extends up to the calf wound that he is dealing with.  No fevers, chills, night sweats.  Feels the Wound is healing well.  No new drainage out of the wound.  He has pain with every step.  It is gotten to the point where it is waking him up at night at times.  Localizes all of his pain to the anterior lateral aspect of the ankle..                ROS: All systems reviewed are negative as they relate to the chief complaint within the history of present illness.  Patient denies fevers or chills.  Assessment & Plan: Visit Diagnoses:  1. Pain in right ankle and joints of right foot     Plan: Patient is a 70 year old male who presents for repeat evaluation of right ankle pain and right calf wound.  Wound is healing well with no expressible drainage today in contrast with last exam.  There is no surrounding erythema and no significant surrounding fluctuance.  Plan to continue with dry dressings daily.  He also complains of right ankle pain with no improvement in last week.  He would like to try a stronger anti-inflammatory rather than the Tylenol that he is taking every 5-6 hours.  Plan to prescribe Celebrex as he denies any history of cardiac disease, stroke, CKD.  Plan to obtain CT scan of the right ankle to evaluate for occult fracture..  Patient agreed with plan.  He is scheduled for right thumb IP joint pinning with Dr. Tempie Donning later today.  Follow-Up Instructions: No  follow-ups on file.   Orders:  No orders of the defined types were placed in this encounter.  No orders of the defined types were placed in this encounter.     Procedures: No procedures performed   Clinical Data: No additional findings.  Objective: Vital Signs: There were no vitals taken for this visit.  Physical Exam:  Constitutional: Patient appears well-developed HEENT:  Head: Normocephalic Eyes:EOM are normal Neck: Normal range of motion Cardiovascular: Normal rate Pulmonary/chest: Effort normal Neurologic: Patient is alert Skin: Skin is warm Psychiatric: Patient has normal mood and affect  Ortho Exam: Ortho exam demonstrates right foot with 2+ DP pulse.  Intact dorsiflexion, plantarflexion, inversion, eversion, EHL.  Anterior tibialis and EHL tendons are intact by palpation.  Tenderness over the joint line of the ankle as well as the ATFL.  No tenderness over the malleoli, Lisfranc complex, plantar calcaneus, fifth metatarsal base.  Does have increased pain with resisted eversion that reproduces his pain.  There is ecchymosis noted in the posterior lateral aspect of the right ankle.  Specialty Comments:  No specialty comments available.  Imaging: No results found.   PMFS History: Patient Active Problem List   Diagnosis Date Noted   Closed  dislocation of interphalangeal joint of right thumb 04/03/2021   Dyslipidemia 05/23/2020   Benign essential HTN 05/06/2020   Subarachnoid hemorrhage ([place]) 04/30/2020   Leukopenia 07/15/2018   Thrombocytopenia ([place]) 07/15/2018   PCP NOTES >>>>>>> 03/23/2015   Annual physical exam 03/20/2015   Bronchospasm 09/06/2014   DJD (degenerative joint disease) 09/06/2014   Hypothyroidism 08/28/2014   Tremor 07/29/2014   Gait disorder 07/29/2014   Gout 01/08/2014   Vitamin D deficiency 11/25/2012   Melanoma of skin, site unspecified 11/25/2012   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   Alcoholism in remission ([place])  06/19/2011   Diverticulitis of colon (without mention of hemorrhage)(562.11) 06/19/2011   Bipolar I disorder ([place]) 06/19/2011   Osteopenia 05/17/2010   Alcohol abuse 05/16/2010   HYPERTRIGLYCERIDEMIA 08/02/2009   Macrocytic anemia 01/10/2009   ABNORMAL ELECTROCARDIOGRAM 11/26/2008   REFLUX, ESOPHAGEAL 11/17/2006   COMMON MIGRAINE 09/22/2006   Past Medical History:  Diagnosis Date   Alcoholism in remission ([place])    in AA   Arrhythmia    atrial tachycardia   Bipolar affective disorder ([place])    Depression    Diverticulosis of colon (without mention of hemorrhage)    GERD (gastroesophageal reflux disease)    Gout of multiple sites    Hiatal hernia    Hx of migraines    migraines- last one since 9-10 yrs ago   Hydrocele    HYPERLIPIDEMIA 01/10/2009   Qualifier: Diagnosis of  By: Ronnald Ramp CMA, Chemira    NMR Lipoprofile 2011 LDL could not be calculated  Due to TG of 889  (811  / 750 ),  HDL 50 . Father MI @ 30 PGM CVA early 56s; PGF CVA @72     Hypothyroidism    Melanoma of skin, site unspecified 11/25/2012   07/2011 abdominal  Stage 1 Dr Amy [place] Dr Sarajane Jews and chest   Osteopenia    Polyarthralgia    Status post placement of implantable loop recorder 08/26/2020   Stroke [facility])    x 3 - last one 04/2020   Subarachnoid hemorrhage ([place])     Family History  Problem Relation Age of Onset   COPD Father    Heart attack Father 61   COPD Mother    Diabetes Paternal Grandmother    Parkinsonism Paternal Grandmother    Stroke Paternal Grandmother 51   Stroke Maternal Grandfather 72   Lung cancer Paternal Grandfather        Heavy smoker   Colon cancer Neg Hx    Prostate cancer Neg Hx    Esophageal cancer Neg Hx    Rectal cancer Neg Hx    Stomach cancer Neg Hx     Past Surgical History:  Procedure Laterality Date   BILIARY BRUSHING  12/07/2018   Procedure: BILIARY BRUSHING;  Surgeon: Irving Copas., MD;  [facility];  Service: Gastroenterology;;   BILIARY  DILATION  12/07/2018   Procedure: BILIARY DILATION;  Surgeon: Irving Copas., MD;  [facility];  Service: Gastroenterology;;   BIOPSY  12/07/2018   Procedure: BIOPSY;  Surgeon: Irving Copas., MD;  Location[facility];  Service: Gastroenterology;;   CHOLECYSTECTOMY N/A 01/29/2021   Procedure: LAPAROSCOPIC CHOLECYSTECTOMY WITH ICG DYE;  Surgeon: Rolm Bookbinder, MD;  Location: [place];  Service: General;  Laterality: N/A;   COLONOSCOPY  2003   negative ; Dr Sharlett Iles   ERCP N/A 12/07/2018   Procedure: ENDOSCOPIC RETROGRADE CHOLANGIOPANCREATOGRAPHY (ERCP);  Surgeon: Irving Copas., MD;  [facility];  Service: Gastroenterology;  Laterality: N/A;   ESOPHAGOGASTRODUODENOSCOPY (EGD) WITH PROPOFOL N/A 12/07/2018   Procedure: ESOPHAGOGASTRODUODENOSCOPY (EGD) WITH PROPOFOL;  Surgeon: Rush Landmark Telford Nab., MD;  [facility];  Service: Gastroenterology;  Laterality: N/A;   EUS N/A 12/07/2018   Procedure: UPPER ENDOSCOPIC ULTRASOUND (EUS) RADIAL;  Surgeon: Irving Copas., MD;  [facility];  Service: Gastroenterology;  Laterality: N/A;   INGUINAL HERNIA REPAIR  08/07/2011   Procedure: HERNIA REPAIR INGUINAL ADULT;  Surgeon: Rolm Bookbinder, MD;  Location: [place];  Service: General;  Laterality: Right;   IR ANGIO INTRA EXTRACRAN SEL INTERNAL CAROTID BILAT MOD SED  05/14/2020   IR ANGIO VERTEBRAL SEL VERTEBRAL BILAT MOD SED  05/14/2020   IR GUIDED DRAIN W CATHETER PLACEMENT  02/05/2021   IR RADIOLOGIST EVAL & MGMT  02/13/2021   LOOP RECORDER INSERTION  06/2020   last remote check was on 03/12/21   MELANOMA EXCISION  07/28/2011   Dr Sarajane Jews; stomach and chest   REMOVAL OF STONES  12/07/2018   Procedure: REMOVAL OF STONES;  Surgeon: Irving Copas., MD;  [facility];  Service: Gastroenterology;;   TONSILLECTOMY  1951   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   UPPER  GASTROINTESTINAL ENDOSCOPY  1983   hiatal hernia   VASECTOMY  1979   Social History   Occupational History   Occupation: retired  Tobacco Use   Smoking status: Never   Smokeless tobacco: Never  Vaping Use   Vaping Use: Never used  Substance and Sexual Activity   Alcohol use: Not Currently    Comment: currently sober since 06/2020   Drug use: No   Sexual activity: Yes    Partners: Female    Comment: Having some issues with ED.  Would like provider's recommendation.

## 2021-04-04 NOTE — Transfer of Care (Signed)
Immediate Anesthesia Transfer of Care Note  Patient: Jeffrey Acosta  Procedure(s) Performed: CLOSED REDUCTION WITH PERCUTANEOUS PINNING RIGHT THUMB (Right: Finger)  Patient Location: PACU  Anesthesia Type:General  Level of Consciousness: awake, alert  and oriented  Airway & Oxygen Therapy: Patient Spontanous Breathing  Post-op Assessment: Report given to RN, Post -op Vital signs reviewed and stable, Patient moving all extremities X 4 and Patient able to stick tongue midline  Post vital signs: stable  Last Vitals:  Vitals Value Taken Time  BP 138/60 04/04/21 1607  Temp 98.4   Pulse 57 04/04/21 1612  Resp 19 04/04/21 1612  SpO2 100 % 04/04/21 1612  Vitals shown include unvalidated device data.  Last Pain:  Vitals:   04/04/21 1041  TempSrc:   PainSc: 5          Complications: No notable events documented.

## 2021-04-04 NOTE — Anesthesia Procedure Notes (Signed)
Procedure Name: LMA Insertion Date/Time: 04/04/2021 3:03 PM Performed by: Maude Leriche, CRNA Pre-anesthesia Checklist: Patient identified, Emergency Drugs available, Suction available, Patient being monitored and Timeout performed Patient Re-evaluated:Patient Re-evaluated prior to induction Oxygen Delivery Method: Circle system utilized Preoxygenation: Pre-oxygenation with 100% oxygen Induction Type: IV induction LMA: LMA inserted LMA Size: 5.0 Number of attempts: 1 Placement Confirmation: positive ETCO2 and breath sounds checked- equal and bilateral Tube secured with: Tape Dental Injury: Teeth and Oropharynx as per pre-operative assessment  Comments: Pt sustained spontaneous respirations w/400mg  Propofol IV

## 2021-04-04 NOTE — Discharge Instructions (Signed)
Audria Nine, M.D. Hand Surgery  POST-OPERATIVE DISCHARGE INSTRUCTIONS   PRESCRIPTIONS: You have been given a prescription to be taken as directed for post-operative pain control.  You may also take over the counter ibuprofen/aleve and tylenol for pain. Take this as directed on the packaging. Do not exceed 3000 mg tylenol/acetaminophen in 24 hours.  Ibuprofen 600-800 mg (3-4) tablets by mouth every 6 hours as needed for pain.  OR Aleve 2 tablets by mouth every 12 hours (twice daily) as needed for pain.  AND/OR Tylenol 1000 mg (2 tablets) every 8 hours as needed for pain.  Please use your pain medication carefully, as refills are limited and you may not be provided with one.  As stated above, please use over the counter pain medicine - it will also be helpful with decreasing your swelling.    ANESTHESIA: After your surgery, post-surgical discomfort or pain is likely. This discomfort can last several days to a few weeks. At certain times of the day your discomfort may be more intense.   Did you receive a nerve block?  A nerve block can provide pain relief for one hour to two days after your surgery. As long as the nerve block is working, you will experience little or no sensation in the area the surgeon operated on.  As the nerve block wears off, you will begin to experience pain or discomfort. It is very important that you begin taking your prescribed pain medication before the nerve block fully wears off. Treating your pain at the first sign of the block wearing off will ensure your pain is better controlled and more tolerable when full-sensation returns. Do not wait until the pain is intolerable, as the medicine will be less effective. It is better to treat pain in advance than to try and catch up.   General Anesthesia:  If you did not receive a nerve block during your surgery, you will need to start taking your pain medication shortly after your surgery and should continue to do  so as prescribed by your surgeon.     ICE AND ELEVATION: You may use ice for the first 48-72 hours, but it is not critical.   Motion of your fingers is very important s to decrease the swelling.   Elevation, as much as possible for the next 48 hours, is critical for decreasing swelling as well as for pain relief. Elevation means when you are seated or lying down, you hand should be at or above your heart. When walking, the hand needs to be at or above the level of your elbow.  If the bandage gets too tight, it may need to be loosened. Please contact our office and we will instruct you in how to do this.    SURGICAL BANDAGES:  Keep your dressing and/or splint clean and dry at all times.  Do not remove until you are seen again in the office.  If careful, you may place a plastic bag over your bandage and tape the end to shower, but be careful, do not get your bandages wet.     HAND THERAPY:  You may not need any. If you do, we will begin this at your follow up visit in the clinic.    ACTIVITY AND WORK: You are encouraged to move any fingers which are not in the bandage.  Light use of the fingers is allowed to assist the other hand with daily hygiene and eating, but strong gripping or lifting is often uncomfortable and  should be avoided.  You might miss a variable period of time from work and hopefully this issue has been discussed prior to surgery. You may not do any heavy work with your affected hand for about 2 weeks.    [facility] [address] [place],  [place]  [phone]

## 2021-04-04 NOTE — Op Note (Signed)
   Date of Surgery: 04/04/2021  INDICATIONS: [name] is a 70 y.o.-year-old male with right thumb IP dislocation after falling down the stairs on 11/18.  The dislocation was initially unrecognized.  He was seen by one of my partners where reduction was attempted.  I also attempted closed reduction and splint in my office but unfortunately he redislocated when the splint came off at some point.  Risks, benefits, and alternatives to closed versus open reduction and percutaneous pinning were again discussed with the patient wishing to proceed with surgery.  Informed consent was signed after our discussion.   PREOPERATIVE DIAGNOSIS: 1. Right thumb IP joint dislocation  POSTOPERATIVE DIAGNOSIS: Same.  PROCEDURE: 1. Right open reduction of thumb IP dislocation with percutaneous pinning   SURGEON: Audria Nine, M.D.  ASSIST:   ANESTHESIA:  general  IV FLUIDS AND URINE: See anesthesia.  ESTIMATED BLOOD LOSS: 5 mL.  IMPLANTS: * No implants in log *   DRAINS: None  COMPLICATIONS: see description of procedure.  DESCRIPTION OF PROCEDURE: The patient was met in the preoperative holding area where the surgical site was marked and the consent form was verified.  The patient was then taken to the operating room and transferred to the operating table.  All bony prominences were well padded.  A tourniquet was applied to the right upper arm.  The operative extremity was prepped and draped in the usual and sterile fashion.  A formal time-out was performed to confirm that this was the correct patient, surgery, side, and site.   Following timeout, the mini fluoroscopy machine was brought in an closed reduction was attempted.  I was unable to reduce the dislocation closed and decided to open the joint.  The limb was exsanguinated and the tourniquet inflated to 250 mmHg.  A curvilinear incision was made over the IP joint.  The EPL tendon was identified.  The joint was opened lateral to the EPL tendon. A  freer elevator was used to clear any interposed tissue and the joint was levered into a reduced position.  A 0.062 k-wire was then advanced across the joint to hold the reduction.  The wound was then thoroughly irrigated and closed using 4-0 nylon sutures.  The k-wire was then cut and bent.  The thumb was dressed with xeroform, folded kerlix, and a well padded thumb spica splint.  The patient was then reversed from anesthesia and transferred to the postoperative bed.  All counts were correct x 2 at the end of the procedure. He was taken to the PACU in stable condition.    POSTOPERATIVE PLAN: He will be discharged to home with appropriate pain medication and discharge instructions.  I'll see him back in the office in two weeks for repeat x-rays and suture removal.   Audria Nine, MD 4:16 PM

## 2021-04-07 ENCOUNTER — Encounter (HOSPITAL_COMMUNITY): Payer: Self-pay | Admitting: Orthopedic Surgery

## 2021-04-07 ENCOUNTER — Ambulatory Visit
Admit: 2021-04-07 | Discharge: 2021-04-07 | Disposition: A | Discharge status: Home / Self Care | Payer: Medicare Other | Attending: Orthopedic Surgery | Admitting: Orthopedic Surgery

## 2021-04-07 DIAGNOSIS — M25571 Pain in right ankle and joints of right foot: Secondary | ICD-10-CM

## 2021-04-07 DIAGNOSIS — M722 Plantar fascial fibromatosis: Secondary | ICD-10-CM | POA: Diagnosis not present

## 2021-04-07 DIAGNOSIS — M7731 Calcaneal spur, right foot: Secondary | ICD-10-CM | POA: Diagnosis not present

## 2021-04-07 DIAGNOSIS — M7989 Other specified soft tissue disorders: Secondary | ICD-10-CM | POA: Diagnosis not present

## 2021-04-07 NOTE — H&P (Signed)
Pt admitted for closed versus open reduction of a right thumb IP dislocation that is now approximately 25 weeks old.  He was last seen in the office last week.  He is doing well this morning.  He denies any new systemic symptoms.  Gen: Alert and oriented, well-appearing Pulm: Normal WOB on RA CV: BUE warm and well perfused, nl rate RUE: Thumb remains swollen and erythematous  70 yo M w/ right thumb IP dislocation  OR today for closed versus open reduction and pin fixation.  Sherilyn Cooter, M.D. [place]

## 2021-04-07 NOTE — Progress Notes (Signed)
Pls call.

## 2021-04-07 NOTE — Progress Notes (Signed)
I called, he will come back in 4 weeks if continued pain

## 2021-04-08 NOTE — Anesthesia Postprocedure Evaluation (Signed)
Anesthesia Post Note  Patient: Jeffrey Acosta  Procedure(s) Performed: CLOSED REDUCTION WITH PERCUTANEOUS PINNING RIGHT THUMB (Right: Finger)     Patient location during evaluation: PACU Anesthesia Type: General Level of consciousness: awake and alert Pain management: pain level controlled Vital Signs Assessment: post-procedure vital signs reviewed and stable Respiratory status: spontaneous breathing, nonlabored ventilation, respiratory function stable and patient connected to nasal cannula oxygen Cardiovascular status: blood pressure returned to baseline and stable Postop Assessment: no apparent nausea or vomiting Anesthetic complications: no   No notable events documented.  Last Vitals:  Vitals:   04/04/21 1623 04/04/21 1638  BP: (!) 145/75 (!) 141/67  Pulse: 60 60  Resp: (!) 23 15  Temp:  36.4 C  SpO2: 100% 100%    Last Pain:  Vitals:   04/04/21 1638  TempSrc:   PainSc: 0-No pain                 Tiajuana Amass

## 2021-04-14 ENCOUNTER — Ambulatory Visit (INDEPENDENT_AMBULATORY_CARE_PROVIDER_SITE_OTHER): Payer: Medicare Other

## 2021-04-14 DIAGNOSIS — I639 Cerebral infarction, unspecified: Secondary | ICD-10-CM | POA: Diagnosis not present

## 2021-04-15 LAB — CUP PACEART REMOTE DEVICE CHECK
Date Time Interrogation Session: 20221219142145
Implantable Pulse Generator Implant Date: 20220502

## 2021-04-18 ENCOUNTER — Ambulatory Visit (INDEPENDENT_AMBULATORY_CARE_PROVIDER_SITE_OTHER): Payer: Medicare Other

## 2021-04-18 ENCOUNTER — Ambulatory Visit (INDEPENDENT_AMBULATORY_CARE_PROVIDER_SITE_OTHER): Payer: Medicare Other | Admitting: Orthopedic Surgery

## 2021-04-18 ENCOUNTER — Encounter: Payer: Self-pay | Admitting: Orthopedic Surgery

## 2021-04-18 ENCOUNTER — Other Ambulatory Visit: Payer: Self-pay

## 2021-04-18 DIAGNOSIS — S63124A Dislocation of unspecified interphalangeal joint of right thumb, initial encounter: Secondary | ICD-10-CM | POA: Diagnosis not present

## 2021-04-18 NOTE — Progress Notes (Signed)
Post-Op Visit Note   Patient: Jeffrey Acosta           Date of Birth: Jan 28, 1951           MRN: [id number] Visit Date: 04/18/2021 PCP: Colon Branch, MD   Assessment & Plan:  Chief Complaint:  Chief Complaint  Patient presents with   Right Hand - Post-op Follow-up   Visit Diagnoses:  1. Closed dislocation of interphalangeal joint of right thumb, initial encounter     Plan: Patient is now two weeks out s/p open reduction and percutaneous pinning of irreducible thumb IP dislocation.  X-rays today show acceptable reduction of the dislocation.  The pin site is clean and dry.  The swelling has improved.  His incision is clean and dry.  Sutures removed.  I'll see him back in two weeks out and will likely remove the pin at that point.   Follow-Up Instructions: No follow-ups on file.   Orders:  Orders Placed This Encounter  Procedures   XR Finger Thumb Right   No orders of the defined types were placed in this encounter.   Imaging: No results found.  PMFS History: Patient Active Problem List   Diagnosis Date Noted   Thumb dislocation, right, initial encounter    Closed dislocation of interphalangeal joint of right thumb 04/03/2021   Dyslipidemia 05/23/2020   Benign essential HTN 05/06/2020   Subarachnoid hemorrhage (HCC) 04/30/2020   Leukopenia 07/15/2018   Thrombocytopenia ([place]) 07/15/2018   PCP NOTES >>>>>>> 03/23/2015   Annual physical exam 03/20/2015   Bronchospasm 09/06/2014   DJD (degenerative joint disease) 09/06/2014   Hypothyroidism 08/28/2014   Tremor 07/29/2014   Gait disorder 07/29/2014   Gout 01/08/2014   Vitamin D deficiency 11/25/2012   Melanoma of skin, site unspecified 11/25/2012   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   Diverticulitis of colon (without mention of hemorrhage)(562.11) 06/19/2011   Bipolar I disorder ([place]) 06/19/2011   Osteopenia 05/17/2010   Alcohol abuse 05/16/2010    HYPERTRIGLYCERIDEMIA 08/02/2009   Macrocytic anemia 01/10/2009   ABNORMAL ELECTROCARDIOGRAM 11/26/2008   REFLUX, ESOPHAGEAL 11/17/2006   COMMON MIGRAINE 09/22/2006   Past Medical History:  Diagnosis Date   Alcoholism in remission ([place])    in AA   Arrhythmia    atrial tachycardia   Bipolar affective disorder ([place])    Depression    Diverticulosis of colon (without mention of hemorrhage)    GERD (gastroesophageal reflux disease)    Gout of multiple sites    Hiatal hernia    Hx of migraines    migraines- last one since 9-10 yrs ago   Hydrocele    HYPERLIPIDEMIA 01/10/2009   Qualifier: Diagnosis of  By: Ronnald Ramp CMA, [place]    NMR Lipoprofile 2011 LDL could not be calculated  Due to TG of 889  (811  / 750 ),  HDL 50 . Father MI @ 28 PGM CVA early 2s; PGF CVA @72     Hypothyroidism    Melanoma of skin, site unspecified 11/25/2012   07/2011 abdominal  Stage 1 Dr Amy Martin[place] Dr Sarajane Jews and chest   Osteopenia    Polyarthralgia    Status post placement of implantable loop recorder 08/26/2020   Stroke [facility])    x 3 - last one 04/2020   Subarachnoid hemorrhage ([place])     Family History  Problem Relation Age of Onset   COPD Father    Heart attack Father 31   COPD Mother  Diabetes Paternal Grandmother    Parkinsonism Paternal Grandmother    Stroke Paternal Grandmother 67   Stroke Maternal Grandfather 25   Lung cancer Paternal Grandfather        Heavy smoker   Colon cancer Neg Hx    Prostate cancer Neg Hx    Esophageal cancer Neg Hx    Rectal cancer Neg Hx    Stomach cancer Neg Hx     Past Surgical History:  Procedure Laterality Date   BILIARY BRUSHING  12/07/2018   Procedure: BILIARY BRUSHING;  Surgeon: Rush Landmark Telford Nab., MD;  Location: [facility];  Service: Gastroenterology;;   BILIARY DILATION  12/07/2018   Procedure: BILIARY DILATION;  Surgeon: Irving Copas., MD;  Location: [facility];  Service: Gastroenterology;;   BIOPSY  12/07/2018   Procedure: BIOPSY;   Surgeon: Irving Copas., MD;  Location: [facility];  Service: Gastroenterology;;   CHOLECYSTECTOMY N/A 01/29/2021   Procedure: LAPAROSCOPIC CHOLECYSTECTOMY WITH ICG DYE;  Surgeon: Rolm Bookbinder, MD;  Location: [place];  Service: General;  Laterality: N/A;   CLOSED REDUCTION FINGER WITH PERCUTANEOUS PINNING Right 04/04/2021   Procedure: CLOSED REDUCTION WITH PERCUTANEOUS PINNING RIGHT THUMB;  Surgeon: Sherilyn Cooter, MD;  Location: [place];  Service: Orthopedics;  Laterality: Right;   COLONOSCOPY  2003   negative ; Dr Sharlett Iles   ERCP N/A 12/07/2018   Procedure: ENDOSCOPIC RETROGRADE CHOLANGIOPANCREATOGRAPHY (ERCP);  Surgeon: Irving Copas., MD;  Location: [facility];  Service: Gastroenterology;  Laterality: N/A;   ESOPHAGOGASTRODUODENOSCOPY (EGD) WITH PROPOFOL N/A 12/07/2018   Procedure: ESOPHAGOGASTRODUODENOSCOPY (EGD) WITH PROPOFOL;  Surgeon: Rush Landmark Telford Nab., MD;  Location: [facility];  Service: Gastroenterology;  Laterality: N/A;   EUS N/A 12/07/2018   Procedure: UPPER ENDOSCOPIC ULTRASOUND (EUS) RADIAL;  Surgeon: Irving Copas., MD;  Location: [facility];  Service: Gastroenterology;  Laterality: N/A;   INGUINAL HERNIA REPAIR  08/07/2011   Procedure: HERNIA REPAIR INGUINAL ADULT;  Surgeon: Rolm Bookbinder, MD;  Location: [place];  Service: General;  Laterality: Right;   IR ANGIO INTRA EXTRACRAN SEL INTERNAL CAROTID BILAT MOD SED  05/14/2020   IR ANGIO VERTEBRAL SEL VERTEBRAL BILAT MOD SED  05/14/2020   IR GUIDED DRAIN W CATHETER PLACEMENT  02/05/2021   IR RADIOLOGIST EVAL & MGMT  02/13/2021   LOOP RECORDER INSERTION  06/2020   last remote check was on 03/12/21   MELANOMA EXCISION  07/28/2011   Dr Sarajane Jews; stomach and chest   REMOVAL OF STONES  12/07/2018   Procedure: REMOVAL OF STONES;  Surgeon: Irving Copas., MD;  Location: [facility];  Service: Gastroenterology;;   TONSILLECTOMY  1951   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  Would like provider's recommendation.

## 2021-04-22 ENCOUNTER — Telehealth: Payer: Self-pay | Admitting: Radiology

## 2021-04-22 ENCOUNTER — Other Ambulatory Visit: Payer: Self-pay | Admitting: Orthopedic Surgery

## 2021-04-22 MED ORDER — OXYCODONE HCL 5 MG PO TABS: 5.0000 mg | ORAL_TABLET | Freq: Four times a day (QID) | ORAL | 0 refills | Status: AC | PRN

## 2021-04-22 NOTE — Telephone Encounter (Signed)
Patient would like a refill on his pain meds. He says "it is sore as hell".

## 2021-04-24 ENCOUNTER — Other Ambulatory Visit: Payer: Self-pay | Admitting: Internal Medicine

## 2021-04-24 NOTE — Progress Notes (Signed)
Carelink Summary Report / Loop Recorder 

## 2021-04-30 ENCOUNTER — Telehealth (INDEPENDENT_AMBULATORY_CARE_PROVIDER_SITE_OTHER): Payer: Medicare Other | Admitting: Internal Medicine

## 2021-04-30 ENCOUNTER — Encounter: Payer: Self-pay | Admitting: Internal Medicine

## 2021-04-30 VITALS — Ht 73.0 in | Wt 195.0 lb

## 2021-04-30 DIAGNOSIS — U071 COVID-19: Secondary | ICD-10-CM | POA: Diagnosis not present

## 2021-04-30 MED ORDER — MOLNUPIRAVIR EUA 200MG CAPSULE: 4.0000 | ORAL_CAPSULE | Freq: Two times a day (BID) | ORAL | 0 refills | Status: AC

## 2021-04-30 NOTE — Assessment & Plan Note (Signed)
COVID infection: Patient developed COVID symptoms 5 days ago, he had at least 3 vaccinations before. Risk factors for severe COVID include age, EtOH. We discussed antivirals,   minimal downside, has the potential to prevent him to getting worse. We agreed on: Molnupiravir, OTCs if needed for cough, call if not gradually improving, call if he feels worse. Isolate for at least 3-4 additional days. COVID booster in 1 month RTC for a routine checkup next month, patient to call and schedule

## 2021-04-30 NOTE — Progress Notes (Signed)
Subjective:    Patient ID: Jeffrey Acosta, male    DOB: 29-Jun-1950, 71 y.o.   MRN: [id number]  DOS:  04/30/2021 Type of visit - description: Virtual Visit via Telephone    I connected with above mentioned patient  by telephone and verified that I am speaking with the correct person using two identifiers.  THIS ENCOUNTER IS A VIRTUAL VISIT DUE TO COVID-19 - PATIENT WAS NOT SEEN IN THE OFFICE. PATIENT HAS CONSENTED TO VIRTUAL VISIT / TELEMEDICINE VISIT   Location of patient: home  Location of provider: office  Persons participating in the virtual visit: patient, provider   I discussed the limitations, risks, security and privacy concerns of performing an evaluation and management service by telephone and the availability of in person appointments. I also discussed with the patient that there may be a patient responsible charge related to this service. The patient expressed understanding and agreed to proceed.  Acute Symptoms a started 04/25/2021: Stuffy nose, sore throat, cough, lack of appetite. Wife was diagnosed with COVID, was prescribed paxlovid, could not finish due to side effects. The patient is calling today to let me know that he has COVID, overall however he feels quite improved but not completely back to normal.  Still has some cough.     Denies fevers No nausea or vomiting.  Had diarrhea which is better now No chest pain or difficulty breathing   Review of Systems See above   Past Medical History:  Diagnosis Date   Alcoholism in remission ([place])    in AA   Arrhythmia    atrial tachycardia   Bipolar affective disorder ([place])    Depression    Diverticulosis of colon (without mention of hemorrhage)    GERD (gastroesophageal reflux disease)    Gout of multiple sites    Hiatal hernia    Hx of migraines    migraines- last one since 9-10 yrs ago   Hydrocele    HYPERLIPIDEMIA 01/10/2009   Qualifier: Diagnosis of  By: Ronnald Ramp CMA, Chemira    NMR Lipoprofile 2011 LDL  could not be calculated  Due to TG of 889  (811  / 750 ),  HDL 50 . Father MI @ 34 PGM CVA early 42s; PGF CVA @72     Hypothyroidism    Melanoma of skin, site unspecified 11/25/2012   07/2011 abdominal  Stage 1 Dr Amy Martinique Dr Sarajane Jews and chest   Osteopenia    Polyarthralgia    Status post placement of implantable loop recorder 08/26/2020   Stroke ([place])    x 3 - last one 04/2020   Subarachnoid hemorrhage [facility])     Past Surgical History:  Procedure Laterality Date   BILIARY BRUSHING  12/07/2018   Procedure: BILIARY BRUSHING;  Surgeon: Irving Copas., MD;  [facility];  Service: Gastroenterology;;   BILIARY DILATION  12/07/2018   Procedure: BILIARY DILATION;  Surgeon: Irving Copas., MD;  Location: [facility];  Service: Gastroenterology;;   BIOPSY  12/07/2018   Procedure: BIOPSY;  Surgeon: Irving Copas., MD;  Location: [facility];  Service: Gastroenterology;;   CHOLECYSTECTOMY N/A 01/29/2021   Procedure: LAPAROSCOPIC CHOLECYSTECTOMY WITH ICG DYE;  Surgeon: Rolm Bookbinder, MD;  Location: [place];  Service: General;  Laterality: N/A;   CLOSED REDUCTION FINGER WITH PERCUTANEOUS PINNING Right 04/04/2021   Procedure: CLOSED REDUCTION WITH PERCUTANEOUS PINNING RIGHT THUMB;  Surgeon: Sherilyn Cooter, MD;  Location: [place];  Service: Orthopedics;  Laterality: Right;   COLONOSCOPY  2003  negative ; Dr Sharlett Iles   ERCP N/A 12/07/2018   Procedure: ENDOSCOPIC RETROGRADE CHOLANGIOPANCREATOGRAPHY (ERCP);  Surgeon: Irving Copas., MD;  Location: [facility];  Service: Gastroenterology;  Laterality: N/A;   ESOPHAGOGASTRODUODENOSCOPY (EGD) WITH PROPOFOL N/A 12/07/2018   Procedure: ESOPHAGOGASTRODUODENOSCOPY (EGD) WITH PROPOFOL;  Surgeon: Rush Landmark Telford Nab., MD;  [facility];  Service: Gastroenterology;  Laterality: N/A;   EUS N/A 12/07/2018   Procedure: UPPER ENDOSCOPIC ULTRASOUND (EUS) RADIAL;  Surgeon: Irving Copas., MD;   [facility];  Service: Gastroenterology;  Laterality: N/A;   INGUINAL HERNIA REPAIR  08/07/2011   Procedure: HERNIA REPAIR INGUINAL ADULT;  Surgeon: Rolm Bookbinder, MD;  Location: [place];  Service: General;  Laterality: Right;   IR ANGIO INTRA EXTRACRAN SEL INTERNAL CAROTID BILAT MOD SED  05/14/2020   IR ANGIO VERTEBRAL SEL VERTEBRAL BILAT MOD SED  05/14/2020   IR GUIDED DRAIN W CATHETER PLACEMENT  02/05/2021   IR RADIOLOGIST EVAL & MGMT  02/13/2021   LOOP RECORDER INSERTION  06/2020   last remote check was on 03/12/21   MELANOMA EXCISION  07/28/2011   Dr Sarajane Jews; stomach and chest   REMOVAL OF STONES  12/07/2018   Procedure: REMOVAL OF STONES;  Surgeon: Irving Copas., MD;  Location: [facility];  Service: Gastroenterology;;   TONSILLECTOMY  1951   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   UPPER GASTROINTESTINAL ENDOSCOPY  1983   hiatal hernia   VASECTOMY  1979    Allergies as of 04/30/2021   No Known Allergies      Medication List        Accurate as of April 30, 2021  9:59 AM. If you have any questions, ask your nurse or doctor.          allopurinol 300 MG tablet Commonly known as: ZYLOPRIM Take 1 tablet (300 mg total) by mouth daily.   aspirin EC 81 MG tablet Take 81 mg by mouth daily. Swallow whole.   b complex vitamins capsule Take 1 capsule by mouth daily.   celecoxib 100 MG capsule Commonly known as: CeleBREX Take 1 capsule (100 mg total) by mouth 2 (two) times daily.   esomeprazole 40 MG capsule Commonly known as: NEXIUM TAKE 1 CAPSULE(40 MG) BY MOUTH DAILY BEFORE BREAKFAST   fluticasone 50 MCG/ACT nasal spray Commonly known as: FLONASE Place 2 sprays into both nostrils daily as needed for congestion.   folic acid 1 MG tablet Commonly known as: FOLVITE TAKE 1 TABLET(1 MG) BY MOUTH DAILY   lamoTRIgine 25 MG tablet Commonly known as: LAMICTAL Take 25 mg by mouth daily.   levothyroxine 50 MCG  tablet Commonly known as: SYNTHROID TAKE 1 TABLET(50 MCG) BY MOUTH DAILY BEFORE BREAKFAST   metoprolol succinate 100 MG 24 hr tablet Commonly known as: TOPROL-XL TAKE 1 AND 1/2 TABLETS(150 MG) BY MOUTH DAILY WITH OR IMMEDIATELY FOLLOWING A MEAL   naltrexone 50 MG tablet Commonly known as: DEPADE Take 50 mg by mouth daily.   QUEtiapine 400 MG tablet Commonly known as: SEROQUEL Take 800 mg by mouth at bedtime.   timolol 0.5 % ophthalmic solution Commonly known as: TIMOPTIC Place 1 drop into both eyes 2 (two) times daily.   thiamine 100 MG tablet TAKE 1 TABLET (100 MG TOTAL) BY MOUTH DAILY.   vitamin B-1 250 MG tablet Take 1 tablet (250 mg total) by mouth daily.   vitamin B-12 500 MCG tablet Commonly known as: CYANOCOBALAMIN Take 500 mcg by mouth daily.  Vitamin D 50 MCG (2000 UT) tablet Take 2,000 Units by mouth daily.           Objective:   Physical Exam Ht 6\' 1"  (1.854 m)    Wt 195 lb (88.5 kg)    BMI 25.73 kg/m  This is a telephone visit, unable to contact patient via video despite multiple attempts, he sounded well, speaking in complete sentences.  Alert oriented x3, no apparent distress.    Assessment     Assessment  Bipolar disorder-- Dr Toy Care, dc lithium 07-2014 EtOH abuse-- remission w/ occ relapse Hypothyroidism Hyperlipidemia E.D.  Atrial Tachycardia dx 03/2019, + Zio patch,  MSK: ---Back pain: Saw Dr. Ramos/Geoff[place]~ 2002, 3 local injections, offered surgery.  ---Gout: Dr Berna Bue, released to PCP 2018 Melanoma 2014 Dr. Martinique, sees derm regularly as off 02-2020 Osteopenia  T score -2.4  ( [phone];  T score is -2.0  (08/2018). 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 Risk factors for severe COVID include age, EtOH. We discussed antivirals,   minimal downside, has the potential to prevent him to getting worse. We agreed on: Molnupiravir, OTCs if needed for cough, call if not gradually improving, call if he feels worse. Isolate for at least 3-4 additional days. COVID booster in 1 month RTC for a routine checkup next month, patient to call and schedule   I discussed the assessment and treatment plan with the patient. The patient was provided an opportunity to ask questions and all were answered. The patient agreed with the plan and demonstrated an understanding of the instructions.   The patient was advised to call back or seek an in-person evaluation if the symptoms worsen or if the condition fails to improve as anticipated.  I provided 18 minutes of non-face-to-face time during this encounter.  Kathlene November, MD

## 2021-05-02 ENCOUNTER — Ambulatory Visit (INDEPENDENT_AMBULATORY_CARE_PROVIDER_SITE_OTHER): Payer: Medicare Other | Admitting: Orthopedic Surgery

## 2021-05-02 ENCOUNTER — Other Ambulatory Visit: Payer: Self-pay

## 2021-05-02 ENCOUNTER — Ambulatory Visit (INDEPENDENT_AMBULATORY_CARE_PROVIDER_SITE_OTHER): Payer: Medicare Other

## 2021-05-02 DIAGNOSIS — S63124A Dislocation of unspecified interphalangeal joint of right thumb, initial encounter: Secondary | ICD-10-CM

## 2021-05-02 NOTE — Progress Notes (Signed)
Post-Op Visit Note   Patient: Jeffrey Acosta           Date of Birth: Feb 12, 1951           MRN: [id number] Visit Date: 05/02/2021 PCP: Colon Branch, MD   Assessment & Plan:  Chief Complaint:  Chief Complaint  Patient presents with   Right Thumb - Routine Post Op   Visit Diagnoses:  1. Closed dislocation of interphalangeal joint of right thumb, initial encounter     Plan: Patient now 4 weeks out from open reduction and percutaneous pinning of an irreducible thumb IP dislocation.  Pin site is clean and dry.  Predictable stiffness at the IP joint w/ some flexion and extension present.  Can see him back in another week just to make sure joint remains concentric with pin removed.   Follow-Up Instructions: No follow-ups on file.   Orders:  Orders Placed This Encounter  Procedures   XR Finger Thumb Right   No orders of the defined types were placed in this encounter.   Imaging: No results found.  PMFS History: Patient Active Problem List   Diagnosis Date Noted   Thumb dislocation, right, initial encounter    Closed dislocation of interphalangeal joint of right thumb 04/03/2021   Dyslipidemia 05/23/2020   Benign essential HTN 05/06/2020   Subarachnoid hemorrhage (HCC) 04/30/2020   Leukopenia 07/15/2018   Thrombocytopenia ([place]) 07/15/2018   PCP NOTES >>>>>>> 03/23/2015   Annual physical exam 03/20/2015   Bronchospasm 09/06/2014   DJD (degenerative joint disease) 09/06/2014   Hypothyroidism 08/28/2014   Tremor 07/29/2014   Gait disorder 07/29/2014   Gout 01/08/2014   Vitamin D deficiency 11/25/2012   Melanoma of skin, site unspecified 11/25/2012   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[place]) 11/25/2012   Alcoholism in remission ([place]) 06/19/2011   Diverticulitis of colon (without mention of hemorrhage)(562.11) 06/19/2011   Bipolar I disorder ([place]) 06/19/2011   Osteopenia 05/17/2010   Alcohol abuse 05/16/2010   HYPERTRIGLYCERIDEMIA 08/02/2009   Macrocytic anemia 01/10/2009    ABNORMAL ELECTROCARDIOGRAM 11/26/2008   REFLUX, ESOPHAGEAL 11/17/2006   COMMON MIGRAINE 09/22/2006   Past Medical History:  Diagnosis Date   Alcoholism in remission ([place])    in AA   Arrhythmia    atrial tachycardia   Bipolar affective disorder ([place])    Depression    Diverticulosis of colon (without mention of hemorrhage)    GERD (gastroesophageal reflux disease)    Gout of multiple sites    Hiatal hernia    Hx of migraines    migraines- last one since 9-10 yrs ago   Hydrocele    HYPERLIPIDEMIA 01/10/2009   Qualifier: Diagnosis of  By: Ronnald Ramp CMA, Chemira    NMR Lipoprofile 2011 LDL could not be calculated  Due to TG of 889  (811  / 750 ),  HDL 50 . Father MI @ 65 PGM CVA early 52s; PGF CVA @72     Hypothyroidism    Melanoma of skin, site unspecified 11/25/2012   07/2011 abdominal  Stage 1 Dr Amy Martinique Dr Sarajane Jews and chest   Osteopenia    Polyarthralgia    Status post placement of implantable loop recorder 08/26/2020   Stroke [facility])    x 3 - last one 04/2020   Subarachnoid hemorrhage ([place])     Family History  Problem Relation Age of Onset   COPD Father    Heart attack Father 73   COPD Mother    Diabetes Paternal Grandmother    Parkinsonism Paternal  Grandmother    Stroke Paternal Grandmother 60   Stroke Maternal Grandfather 16   Lung cancer Paternal Grandfather        Heavy smoker   Colon cancer Neg Hx    Prostate cancer Neg Hx    Esophageal cancer Neg Hx    Rectal cancer Neg Hx    Stomach cancer Neg Hx     Past Surgical History:  Procedure Laterality Date   BILIARY BRUSHING  12/07/2018   Procedure: BILIARY BRUSHING;  Surgeon: Rush Landmark Telford Nab., MD;  Location[facility];  Service: Gastroenterology;;   BILIARY DILATION  12/07/2018   Procedure: BILIARY DILATION;  Surgeon: Irving Copas., MD;  Location: [facility];  Service: Gastroenterology;;   BIOPSY  12/07/2018   Procedure: BIOPSY;  Surgeon: Irving Copas., MD;  Location: [facility];   Service: Gastroenterology;;   CHOLECYSTECTOMY N/A 01/29/2021   Procedure: LAPAROSCOPIC CHOLECYSTECTOMY WITH ICG DYE;  Surgeon: Rolm Bookbinder, MD;  Location: [place];  Service: General;  Laterality: N/A;   CLOSED REDUCTION FINGER WITH PERCUTANEOUS PINNING Right 04/04/2021   Procedure: CLOSED REDUCTION WITH PERCUTANEOUS PINNING RIGHT THUMB;  Surgeon: Sherilyn Cooter, MD;  Location: [place];  Service: Orthopedics;  Laterality: Right;   COLONOSCOPY  2003   negative ; Dr Sharlett Iles   ERCP N/A 12/07/2018   Procedure: ENDOSCOPIC RETROGRADE CHOLANGIOPANCREATOGRAPHY (ERCP);  Surgeon: Irving Copas., MD;  Location: [facility];  Service: Gastroenterology;  Laterality: N/A;   ESOPHAGOGASTRODUODENOSCOPY (EGD) WITH PROPOFOL N/A 12/07/2018   Procedure: ESOPHAGOGASTRODUODENOSCOPY (EGD) WITH PROPOFOL;  Surgeon: Rush Landmark Telford Nab., MD;  [facility];  Service: Gastroenterology;  Laterality: N/A;   EUS N/A 12/07/2018   Procedure: UPPER ENDOSCOPIC ULTRASOUND (EUS) RADIAL;  Surgeon: Irving Copas., MD;  [facility];  Service: Gastroenterology;  Laterality: N/A;   INGUINAL HERNIA REPAIR  08/07/2011   Procedure: HERNIA REPAIR INGUINAL ADULT;  Surgeon: Rolm Bookbinder, MD;  Location: [place];  Service: General;  Laterality: Right;   IR ANGIO INTRA EXTRACRAN SEL INTERNAL CAROTID BILAT MOD SED  05/14/2020   IR ANGIO VERTEBRAL SEL VERTEBRAL BILAT MOD SED  05/14/2020   IR GUIDED DRAIN W CATHETER PLACEMENT  02/05/2021   IR RADIOLOGIST EVAL & MGMT  02/13/2021   LOOP RECORDER INSERTION  06/2020   last remote check was on 03/12/21   MELANOMA EXCISION  07/28/2011   Dr Sarajane Jews; stomach and chest   REMOVAL OF STONES  12/07/2018   Procedure: REMOVAL OF STONES;  Surgeon: Irving Copas., MD;  [facility];  Service: Gastroenterology;;   TONSILLECTOMY  1951   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   UPPER GASTROINTESTINAL ENDOSCOPY   1983   hiatal hernia   VASECTOMY  1979   Social History   Occupational History   Occupation: retired  Tobacco Use   Smoking status: Never   Smokeless tobacco: Never  Vaping Use   Vaping Use: Never used  Substance and Sexual Activity   Alcohol use: Not Currently    Comment: currently sober since 06/2020   Drug use: No   Sexual activity: Yes    Partners: Female    Comment: Having some issues with ED.  Would like provider's recommendation.

## 2021-05-09 ENCOUNTER — Ambulatory Visit (INDEPENDENT_AMBULATORY_CARE_PROVIDER_SITE_OTHER): Payer: Medicare Other

## 2021-05-09 ENCOUNTER — Ambulatory Visit (INDEPENDENT_AMBULATORY_CARE_PROVIDER_SITE_OTHER): Payer: Medicare Other | Admitting: Orthopedic Surgery

## 2021-05-09 ENCOUNTER — Other Ambulatory Visit: Payer: Self-pay

## 2021-05-09 DIAGNOSIS — S63124A Dislocation of unspecified interphalangeal joint of right thumb, initial encounter: Secondary | ICD-10-CM | POA: Diagnosis not present

## 2021-05-09 NOTE — Progress Notes (Signed)
Post-Op Visit Note   Patient: Jeffrey Acosta           Date of Birth: 1950-08-28           MRN: [id number] Visit Date: 05/09/2021 PCP: Colon Branch, MD   Assessment & Plan:  Chief Complaint:  Chief Complaint  Patient presents with   Right Hand - Follow-up    Right IP joint fracture   Visit Diagnoses:  1. Closed dislocation of interphalangeal joint of right thumb, initial encounter     Plan: Patient is now 5 weeks out from open reduction and pinning of his thumb IP dislocation.  He has no pain today.  X-rays show maintained reduction of the joint w/ a fracture of the volar lip of the distal phalanx.  We discussed using the splint as needed when he is doing activities in which he can jam or re-injure the thumb.  If he develops recurrent instability of the IP joint, then we can discuss IP fusion but the joint seems stable on exam today. I can see him back as needed.   Follow-Up Instructions: No follow-ups on file.   Orders:  Orders Placed This Encounter  Procedures   XR Finger Thumb Right   No orders of the defined types were placed in this encounter.   Imaging: No results found.  PMFS History: Patient Active Problem List   Diagnosis Date Noted   Thumb dislocation, right, initial encounter    Closed dislocation of interphalangeal joint of right thumb 04/03/2021   Dyslipidemia 05/23/2020   Benign essential HTN 05/06/2020   Subarachnoid hemorrhage (HCC) 04/30/2020   Leukopenia 07/15/2018   Thrombocytopenia ([place]) 07/15/2018   PCP NOTES >>>>>>> 03/23/2015   Annual physical exam 03/20/2015   Bronchospasm 09/06/2014   DJD (degenerative joint disease) 09/06/2014   Hypothyroidism 08/28/2014   Tremor 07/29/2014   Gait disorder 07/29/2014   Gout 01/08/2014   Vitamin D deficiency 11/25/2012   Melanoma of skin, site unspecified 11/25/2012   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[place]) 11/25/2012   Alcoholism in remission ([place]) 06/19/2011   Diverticulitis of colon (without mention of  hemorrhage)(562.11) 06/19/2011   Bipolar I disorder ([place]) 06/19/2011   Osteopenia 05/17/2010   Alcohol abuse 05/16/2010   HYPERTRIGLYCERIDEMIA 08/02/2009   Macrocytic anemia 01/10/2009   ABNORMAL ELECTROCARDIOGRAM 11/26/2008   REFLUX, ESOPHAGEAL 11/17/2006   COMMON MIGRAINE 09/22/2006   Past Medical History:  Diagnosis Date   Alcoholism in remission ([place])    in AA   Arrhythmia    atrial tachycardia   Bipolar affective disorder ([place])    Depression    Diverticulosis of colon (without mention of hemorrhage)    GERD (gastroesophageal reflux disease)    Gout of multiple sites    Hiatal hernia    Hx of migraines    migraines- last one since 9-10 yrs ago   Hydrocele    HYPERLIPIDEMIA 01/10/2009   Qualifier: Diagnosis of  By: Ronnald Ramp CMA, Chemira    NMR Lipoprofile 2011 LDL could not be calculated  Due to TG of 889  (811  / 750 ),  HDL 50 . Father MI @ 77 PGM CVA early 19s; PGF CVA @72     Hypothyroidism    Melanoma of skin, site unspecified 11/25/2012   07/2011 abdominal  Stage 1 Dr Amy Martinique Dr Sarajane Jews and chest   Osteopenia    Polyarthralgia    Status post placement of implantable loop recorder 08/26/2020   Stroke ([place])    x 3 - last one  04/2020   Subarachnoid hemorrhage (HCC)     Family History  Problem Relation Age of Onset   COPD Father    Heart attack Father 31   COPD Mother    Diabetes Paternal Grandmother    Parkinsonism Paternal Grandmother    Stroke Paternal Grandmother 66   Stroke Maternal Grandfather 73   Lung cancer Paternal Grandfather        Heavy smoker   Colon cancer Neg Hx    Prostate cancer Neg Hx    Esophageal cancer Neg Hx    Rectal cancer Neg Hx    Stomach cancer Neg Hx     Past Surgical History:  Procedure Laterality Date   BILIARY BRUSHING  12/07/2018   Procedure: BILIARY BRUSHING;  Surgeon: Irving Copas., MD;  [facility];  Service: Gastroenterology;;   BILIARY DILATION  12/07/2018   Procedure: BILIARY DILATION;  Surgeon:  Irving Copas., MD;  [facility];  Service: Gastroenterology;;   BIOPSY  12/07/2018   Procedure: BIOPSY;  Surgeon: Irving Copas., MD;  [facility];  Service: Gastroenterology;;   CHOLECYSTECTOMY N/A 01/29/2021   Procedure: LAPAROSCOPIC CHOLECYSTECTOMY WITH ICG DYE;  Surgeon: Rolm Bookbinder, MD;  Location: [place];  Service: General;  Laterality: N/A;   CLOSED REDUCTION FINGER WITH PERCUTANEOUS PINNING Right 04/04/2021   Procedure: CLOSED REDUCTION WITH PERCUTANEOUS PINNING RIGHT THUMB;  Surgeon: Sherilyn Cooter, MD;  Location: [place];  Service: Orthopedics;  Laterality: Right;   COLONOSCOPY  2003   negative ; Dr Sharlett Iles   ERCP N/A 12/07/2018   Procedure: ENDOSCOPIC RETROGRADE CHOLANGIOPANCREATOGRAPHY (ERCP);  Surgeon: Irving Copas., MD;  Location[facility];  Service: Gastroenterology;  Laterality: N/A;   ESOPHAGOGASTRODUODENOSCOPY (EGD) WITH PROPOFOL N/A 12/07/2018   Procedure: ESOPHAGOGASTRODUODENOSCOPY (EGD) WITH PROPOFOL;  Surgeon: Rush Landmark Telford Nab., MD;  [facility];  Service: Gastroenterology;  Laterality: N/A;   EUS N/A 12/07/2018   Procedure: UPPER ENDOSCOPIC ULTRASOUND (EUS) RADIAL;  Surgeon: Irving Copas., MD;  [facility];  Service: Gastroenterology;  Laterality: N/A;   INGUINAL HERNIA REPAIR  08/07/2011   Procedure: HERNIA REPAIR INGUINAL ADULT;  Surgeon: Rolm Bookbinder, MD;  Location: [place];  Service: General;  Laterality: Right;   IR ANGIO INTRA EXTRACRAN SEL INTERNAL CAROTID BILAT MOD SED  05/14/2020   IR ANGIO VERTEBRAL SEL VERTEBRAL BILAT MOD SED  05/14/2020   IR GUIDED DRAIN W CATHETER PLACEMENT  02/05/2021   IR RADIOLOGIST EVAL & MGMT  02/13/2021   LOOP RECORDER INSERTION  06/2020   last remote check was on 03/12/21   MELANOMA EXCISION  07/28/2011   Dr Sarajane Jews; stomach and chest   REMOVAL OF STONES  12/07/2018   Procedure: REMOVAL OF STONES;  Surgeon:  Irving Copas., MD;  [facility];  Service: Gastroenterology;;   TONSILLECTOMY  1951   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   UPPER GASTROINTESTINAL ENDOSCOPY  1983   hiatal hernia   VASECTOMY  1979   Social History   Occupational History   Occupation: retired  Tobacco Use   Smoking status: Never   Smokeless tobacco: Never  Vaping Use   Vaping Use: Never used  Substance and Sexual Activity   Alcohol use: Not Currently    Comment: currently sober since 06/2020   Drug use: No   Sexual activity: Yes    Partners: Female    Comment: Having some issues with ED.  Would like provider's recommendation.

## 2021-05-19 ENCOUNTER — Ambulatory Visit (INDEPENDENT_AMBULATORY_CARE_PROVIDER_SITE_OTHER): Payer: Medicare Other

## 2021-05-19 DIAGNOSIS — I639 Cerebral infarction, unspecified: Secondary | ICD-10-CM | POA: Diagnosis not present

## 2021-05-20 LAB — CUP PACEART REMOTE DEVICE CHECK
Date Time Interrogation Session: 20230124072353
Implantable Pulse Generator Implant Date: 20220502

## 2021-05-29 NOTE — Progress Notes (Signed)
Carelink Summary Report / Loop Recorder 

## 2021-06-21 LAB — CUP PACEART REMOTE DEVICE CHECK
Date Time Interrogation Session: 20230224230834
Implantable Pulse Generator Implant Date: 20220502

## 2021-06-23 ENCOUNTER — Ambulatory Visit (INDEPENDENT_AMBULATORY_CARE_PROVIDER_SITE_OTHER): Payer: Medicare Other

## 2021-06-23 DIAGNOSIS — I639 Cerebral infarction, unspecified: Secondary | ICD-10-CM | POA: Diagnosis not present

## 2021-06-27 ENCOUNTER — Encounter: Payer: Self-pay | Admitting: Internal Medicine

## 2021-06-30 NOTE — Progress Notes (Signed)
Carelink Summary Report / Loop Recorder 

## 2021-07-22 ENCOUNTER — Other Ambulatory Visit: Payer: Self-pay | Admitting: Internal Medicine

## 2021-07-28 ENCOUNTER — Ambulatory Visit (INDEPENDENT_AMBULATORY_CARE_PROVIDER_SITE_OTHER): Payer: Medicare Other

## 2021-07-28 DIAGNOSIS — I639 Cerebral infarction, unspecified: Secondary | ICD-10-CM | POA: Diagnosis not present

## 2021-07-29 LAB — CUP PACEART REMOTE DEVICE CHECK
Date Time Interrogation Session: 20230331230539
Implantable Pulse Generator Implant Date: 20220502

## 2021-08-06 ENCOUNTER — Telehealth: Payer: Self-pay | Admitting: Internal Medicine

## 2021-08-06 ENCOUNTER — Encounter: Payer: Self-pay | Admitting: Internal Medicine

## 2021-08-06 ENCOUNTER — Ambulatory Visit (INDEPENDENT_AMBULATORY_CARE_PROVIDER_SITE_OTHER): Payer: Medicare Other | Admitting: Internal Medicine

## 2021-08-06 VITALS — BP 134/80 | HR 58 | Temp 97.8°F | Resp 16 | Ht 73.0 in | Wt 201.1 lb

## 2021-08-06 DIAGNOSIS — Z125 Encounter for screening for malignant neoplasm of prostate: Secondary | ICD-10-CM

## 2021-08-06 DIAGNOSIS — Z0001 Encounter for general adult medical examination with abnormal findings: Secondary | ICD-10-CM

## 2021-08-06 DIAGNOSIS — I10 Essential (primary) hypertension: Secondary | ICD-10-CM | POA: Diagnosis not present

## 2021-08-06 DIAGNOSIS — E039 Hypothyroidism, unspecified: Secondary | ICD-10-CM | POA: Diagnosis not present

## 2021-08-06 DIAGNOSIS — E559 Vitamin D deficiency, unspecified: Secondary | ICD-10-CM | POA: Diagnosis not present

## 2021-08-06 DIAGNOSIS — M8580 Other specified disorders of bone density and structure, unspecified site: Secondary | ICD-10-CM | POA: Diagnosis not present

## 2021-08-06 DIAGNOSIS — D649 Anemia, unspecified: Secondary | ICD-10-CM

## 2021-08-06 DIAGNOSIS — E781 Pure hyperglyceridemia: Secondary | ICD-10-CM

## 2021-08-06 DIAGNOSIS — Z0000 Encounter for general adult medical examination without abnormal findings: Secondary | ICD-10-CM

## 2021-08-06 DIAGNOSIS — G459 Transient cerebral ischemic attack, unspecified: Secondary | ICD-10-CM

## 2021-08-06 LAB — CBC WITH DIFFERENTIAL/PLATELET
Basophils Absolute: 0 10*3/uL (ref 0.0–0.1)
Basophils Relative: 0.5 % (ref 0.0–3.0)
Eosinophils Absolute: 0.2 10*3/uL (ref 0.0–0.7)
Eosinophils Relative: 2.5 % (ref 0.0–5.0)
HCT: 35.5 % — ABNORMAL LOW (ref 39.0–52.0)
Hemoglobin: 12.5 g/dL — ABNORMAL LOW (ref 13.0–17.0)
Lymphocytes Relative: 28.9 % (ref 12.0–46.0)
Lymphs Abs: 1.8 10*3/uL (ref 0.7–4.0)
MCHC: 35.2 g/dL (ref 30.0–36.0)
MCV: 101.3 fl — ABNORMAL HIGH (ref 78.0–100.0)
Monocytes Absolute: 0.4 10*3/uL (ref 0.1–1.0)
Monocytes Relative: 6.2 % (ref 3.0–12.0)
Neutro Abs: 3.8 10*3/uL (ref 1.4–7.7)
Neutrophils Relative %: 61.9 % (ref 43.0–77.0)
Platelets: 198 10*3/uL (ref 150.0–400.0)
RBC: 3.5 Mil/uL — ABNORMAL LOW (ref 4.22–5.81)
RDW: 12.5 % (ref 11.5–15.5)
WBC: 6.2 10*3/uL (ref 4.0–10.5)

## 2021-08-06 LAB — LIPID PANEL
Cholesterol: 169 mg/dL (ref 0–200)
HDL: 50.3 mg/dL (ref >39.00)
LDL Cholesterol: 90 mg/dL (ref 0–99)
NonHDL: 118.74
Total CHOL/HDL Ratio: 3
Triglycerides: 145 mg/dL (ref 0.0–149.0)
VLDL: 29 mg/dL (ref 0.0–40.0)

## 2021-08-06 LAB — BASIC METABOLIC PANEL
BUN: 14 mg/dL (ref 6–23)
CO2: 29 mEq/L (ref 19–32)
Calcium: 9.3 mg/dL (ref 8.4–10.5)
Chloride: 107 mEq/L (ref 96–112)
Creatinine, Ser: 1.08 mg/dL (ref 0.40–1.50)
GFR: 68.39 mL/min (ref >60.00)
Glucose, Bld: 95 mg/dL (ref 70–99)
Potassium: 4.7 mEq/L (ref 3.5–5.1)
Sodium: 141 mEq/L (ref 135–145)

## 2021-08-06 LAB — PSA: PSA: 0.6 ng/mL (ref 0.10–4.00)

## 2021-08-06 LAB — TSH: TSH: 1.25 u[IU]/mL (ref 0.35–5.50)

## 2021-08-06 LAB — FERRITIN: Ferritin: 50.9 ng/mL (ref 22.0–322.0)

## 2021-08-06 LAB — VITAMIN D 25 HYDROXY (VIT D DEFICIENCY, FRACTURES): VITD: 42.28 ng/mL (ref 30.00–100.00)

## 2021-08-06 LAB — IRON: Iron: 142 ug/dL (ref 42–165)

## 2021-08-06 MED ORDER — CLOPIDOGREL BISULFATE 75 MG PO TABS: 75.0000 mg | ORAL_TABLET | Freq: Every day | ORAL | 1 refills | Status: DC

## 2021-08-06 NOTE — Telephone Encounter (Signed)
FYI

## 2021-08-06 NOTE — Telephone Encounter (Signed)
Thank you, noted.

## 2021-08-06 NOTE — Telephone Encounter (Signed)
Pt was seen today and forgot to mentioned to provider that he has not been taking his Vitamin D3 but will be getting his Vitamins at the pharmacy today and will start on it. Pt just wanted to update that with provider. ?

## 2021-08-06 NOTE — Assessment & Plan Note (Signed)
-  Tdap: 2014 ?- PNM 13: 2016; PNA shot 23: 2009, 2019 ?-Shingrix recommended ?- COVID vaccine: booster d/w pt ?-CCS: Last cscope  2015. Some Diverticulosis , Dr Henrene Pastor, 10 years  ?-Prostate cancer screening:  ?- labs:BMP FLP CBC TSH PSA iron, ferritin ?-Diet exercise discussed.  ?- ACP info provided  ?  ?  ? ?

## 2021-08-06 NOTE — Assessment & Plan Note (Addendum)
Here for CPX ?Bipolar: Seems well controlled ?EtOH: Reports he is  not drinking . ?Hypothyroidism: On meds, check TSH ?High cholesterol:Currently on no medication, per cardiology note 01/21/2021 "taken off statin by neurology".  Will check labs and discuss with neurology. ?Vitamin D deficiency: Checking labs, not taking supplements at this point. ?GERD, Barrett's: On Nexium, no symptoms,Next EGD 09/2023 per GI letter from 10/16/2020. ?TIA: ?Patient with a history of MCI, history of cryptogenic stroke last seen by neurology December 2022, currently on aspirin, reports a episode of dysarthria 10 days ago c/w TIA, will discuss with neurology via message. ?Addendum: Dr. Leonie Man recommend: ?Start Plavix 75 mg long-term. ?Continue aspirin for the next 2 weeks then stop. ?We will communicate that to the patient ?Motor deficit: reports draags R leg since strokes, offered referral for a brace, will let me know if interested ?Mild anemia: Per chart review, check CBC. Iron, ferritin, no GI sxs ?Osteopenia: Last T score was (-) 2.0 May 2020, recheck a DEXA. ?RTC 3 months ? ?

## 2021-08-06 NOTE — Patient Instructions (Signed)
Check the  blood pressure regularly ?BP GOAL is between 110/65 and  135/85. ?If it is consistently higher or lower, let me know ? ?Read information about advance care planning ? ? ?GO TO THE LAB : Get the blood work   ? ? ?[place], [place] ?Come back for a checkup in 3 months ?

## 2021-08-06 NOTE — Progress Notes (Signed)
? ?Subjective:  ? ? Patient ID: Jeffrey Acosta, male    DOB: [birth year], 71 y.o.   MRN: [id number] ? ?DOS:  08/06/2021 ?Type of visit - description: CPX ? ?Here for CPX ?Chronic medical problems were addressed ?He also has other issues: ? ?10 days ago, for a period of 15 minutes, he was unable to speak, he could make noises but not talk ?At the time, motor was at baseline, no visual disturbances, no seizure activities, no headache or dizziness. ?Symptoms have not return. ? ?Reports difficulties with memory, review note from neurology 03/2021. ? ?Having R leg weakness since he had multiple stroke few years ago, no recent falls. ? ? ?Review of Systems ? ?Other than above, a 14 point review of systems is negative  ? ? ? ?Past Medical History:  ?Diagnosis Date  ? Alcoholism in remission [facility])   ? in [place]  ? Arrhythmia   ? atrial tachycardia  ? Bipolar affective disorder ([place])   ? Depression   ? Diverticulosis of colon (without mention of hemorrhage)   ? GERD (gastroesophageal reflux disease)   ? Gout of multiple sites   ? Hiatal hernia   ? Hx of migraines   ? migraines- last one since 9-10 yrs ago  ? Hydrocele   ? HYPERLIPIDEMIA 01/10/2009  ? Qualifier: Diagnosis of  By: Ronnald Ramp CMA, Chemira    NMR Lipoprofile 2011 LDL could not be calculated  Due to TG of 889  (811  / 750 ),  HDL 50 . Father MI @ 34 PGM CVA early 82s; PGF CVA '@72'$    ? Hypothyroidism   ? Melanoma of skin, site unspecified 11/25/2012  ? 07/2011 abdominal  Stage 1 Dr Amy Martinique Dr Sarajane Jews and chest  ? Osteopenia   ? Polyarthralgia   ? Status post placement of implantable loop recorder 08/26/2020  ? Stroke [facility])   ? x 3 - last one 04/2020  ? Subarachnoid hemorrhage ([place])   ? ? ?Past Surgical History:  ?Procedure Laterality Date  ? BILIARY BRUSHING  12/07/2018  ? Procedure: BILIARY BRUSHING;  Surgeon: Rush Landmark Telford Nab., MD;  Location: [facility];  Service: Gastroenterology;;  ? BILIARY DILATION  12/07/2018  ? Procedure: BILIARY DILATION;  Surgeon:  Irving Copas., MD;  Location: [facility];  Service: Gastroenterology;;  ? BIOPSY  12/07/2018  ? Procedure: BIOPSY;  Surgeon: Irving Copas., MD;  Location: [facility];  Service: Gastroenterology;;  ? CHOLECYSTECTOMY N/A 01/29/2021  ? Procedure: LAPAROSCOPIC CHOLECYSTECTOMY WITH ICG DYE;  Surgeon: Rolm Bookbinder, MD;  Location: [place];  Service: General;  Laterality: N/A;  ? CLOSED REDUCTION FINGER WITH PERCUTANEOUS PINNING Right 04/04/2021  ? Procedure: CLOSED REDUCTION WITH PERCUTANEOUS PINNING RIGHT THUMB;  Surgeon: Sherilyn Cooter, MD;  Location: [place];  Service: Orthopedics;  Laterality: Right;  ? COLONOSCOPY  2003  ? negative ; Dr Sharlett Iles  ? ERCP N/A 12/07/2018  ? Procedure: ENDOSCOPIC RETROGRADE CHOLANGIOPANCREATOGRAPHY (ERCP);  Surgeon: Irving Copas., MD;  Location: [facility];  Service: Gastroenterology;  Laterality: N/A;  ? ESOPHAGOGASTRODUODENOSCOPY (EGD) WITH PROPOFOL N/A 12/07/2018  ? Procedure: ESOPHAGOGASTRODUODENOSCOPY (EGD) WITH PROPOFOL;  Surgeon: Rush Landmark Telford Nab., MD;  Location: [facility];  Service: Gastroenterology;  Laterality: N/A;  ? EUS N/A 12/07/2018  ? Procedure: UPPER ENDOSCOPIC ULTRASOUND (EUS) RADIAL;  Surgeon: Rush Landmark Telford Nab., MD;  Location: [facility];  Service: Gastroenterology;  Laterality: N/A;  ? INGUINAL HERNIA REPAIR  08/07/2011  ? Procedure: HERNIA REPAIR INGUINAL ADULT;  Surgeon: Rolm Bookbinder, MD;  Location:  [place];  Service: General;  Laterality: Right;  ? IR ANGIO INTRA EXTRACRAN SEL INTERNAL CAROTID BILAT MOD SED  05/14/2020  ? IR ANGIO VERTEBRAL SEL VERTEBRAL BILAT MOD SED  05/14/2020  ? IR GUIDED DRAIN W CATHETER PLACEMENT  02/05/2021  ? IR RADIOLOGIST EVAL & MGMT  02/13/2021  ? LOOP RECORDER INSERTION  06/2020  ? last remote check was on 03/12/21  ? MELANOMA EXCISION  07/28/2011  ? Dr Sarajane Jews; stomach and chest  ? REMOVAL OF STONES  12/07/2018  ? Procedure: REMOVAL OF STONES;  Surgeon:  Mansouraty, Telford Nab., MD;  Location: [facility];  Service: Gastroenterology;;  ? TONSILLECTOMY  1951  ? Undescended testicle surgery  1950  ? as infant  ? UPPER GASTROINTESTINAL ENDOSCOPY  1983  ? hiatal hernia  ? VASECTOMY  1979  ? ?Social History  ? ?Socioeconomic History  ? Marital status: Married  ?  Spouse name: [name]  ? Number of children: 3  ? Years of education: Not on file  ? Highest education level: Not on file  ?Occupational History  ? Occupation: retired  ?Tobacco Use  ? Smoking status: Never  ? Smokeless tobacco: Never  ?Vaping Use  ? Vaping Use: Never used  ?Substance and Sexual Activity  ? Alcohol use: Not Currently  ?  Comment: currently sober since 06/2020  ? Drug use: No  ? Sexual activity: Yes  ?  Partners: Female  ?  Comment: Having some issues with ED.  Would like provider's recommendation.    ?Other Topics Concern  ? Not on file  ?Social History Narrative  ? Lives with Wife, Jeannene Patella  ? Right Handed  ? Drinks 4-5 cups caffeine/week  ? ?Social Determinants of Health  ? ?Financial Resource Strain: Not on file  ?Food Insecurity: Not on file  ?Transportation Needs: Not on file  ?Physical Activity: Not on file  ?Stress: Not on file  ?Social Connections: Not on file  ?Intimate Partner Violence: Not on file  ? ? ?Current Outpatient Medications  ?Medication Instructions  ? allopurinol (ZYLOPRIM) 300 mg, Oral, Daily  ? aspirin EC 81 mg, Oral, Daily, Swallow whole.  ? b complex vitamins capsule 1 capsule, Oral, Daily  ? celecoxib (CELEBREX) 100 mg, Oral, 2 times daily  ? esomeprazole (NEXIUM) 40 MG capsule TAKE 1 CAPSULE(40 MG) BY MOUTH DAILY BEFORE BREAKFAST  ? fluticasone (FLONASE) 50 MCG/ACT nasal spray 2 sprays, Each Nare, Daily PRN  ? folic acid (FOLVITE) 1 MG tablet TAKE 1 TABLET(1 MG) BY MOUTH DAILY  ? lamoTRIgine (LAMICTAL) 25 mg, Oral, Daily  ? levothyroxine (SYNTHROID) 50 MCG tablet TAKE 1 TABLET(50 MCG) BY MOUTH DAILY BEFORE BREAKFAST  ? metoprolol succinate (TOPROL-XL) 100 MG 24 hr tablet TAKE 1  AND 1/2 TABLETS(150 MG) BY MOUTH DAILY WITH OR IMMEDIATELY FOLLOWING A MEAL  ? naltrexone (DEPADE) 50 mg, Oral, Daily  ? QUEtiapine (SEROQUEL) 800 mg, Oral, Daily at bedtime  ? timolol (TIMOPTIC) 0.5 % ophthalmic solution 1 drop, Both Eyes, 2 times daily  ? vitamin B-12 (CYANOCOBALAMIN) 500 mcg, Oral, Daily  ? vitamin B-1 250 mg, Oral, Daily  ? Vitamin D 2,000 Units, Oral, Daily  ? ? ?   ?Objective:  ? Physical Exam ?BP 134/80 (BP Location: Left Arm, Patient Position: Sitting, Cuff Size: Small)   Pulse (!) 58   Temp 97.8 ?F (36.6 ?C) (Oral)   Resp 16   Ht '6\' 1"'$  (1.854 m)   Wt 201 lb 2 oz (91.2 kg)   SpO2  99%   BMI 26.54 kg/m?  ?General: ?Well developed, NAD, BMI noted ?Neck: No  thyromegaly  ?HEENT:  ?Normocephalic . Face symmetric, atraumatic ?Lungs:  ?CTA B ?Normal respiratory effort, no intercostal retractions, no accessory muscle use. ?Heart: RRR,  no murmur.  ?Abdomen:  ?Not distended, soft, non-tender. No rebound or rigidity.   ?Lower extremities: no pretibial edema bilaterally ?DRE: Normal sphincter tone, brown stools, prostate normal ?Skin: Exposed areas without rash. Not pale. Not jaundice ?Neurologic:  ?alert & oriented X3.  ?Speech normal  ?Psych: ?Cognition and judgment appear intact.  ?Cooperative with normal attention span and concentration.  ?Behavior appropriate. ?No anxious or depressed appearing. ? ?   ?Assessment   ? ?Assessment  ?Bipolar disorder-- Dr Toy Care, dc lithium [phone] ?EtOH abuse-- remission w/ occ relapse ?Hypothyroidism ?Hyperlipidemia ?E.D.  ?Atrial Tachycardia dx 03/2019, + Zio patch,  ?MSK: ?---Back pain: Saw Dr. Ramos/Geoff[place]~ 2002, 3 local injections, offered surgery.  ?---Gout: Dr Berna Bue, released to PCP 2018 ?Melanoma 2014 Dr. Martinique, sees derm regularly as off 07-2021 ?Osteopenia  T score -2.4  ( [phone];  T score is -2.0  (08/2018). 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 ? ?PLAN ?Here

## 2021-08-11 NOTE — Progress Notes (Signed)
Carelink Summary Report / Loop Recorder 

## 2021-08-14 ENCOUNTER — Other Ambulatory Visit: Payer: Self-pay | Admitting: Internal Medicine

## 2021-08-21 ENCOUNTER — Other Ambulatory Visit: Payer: Self-pay | Admitting: Internal Medicine

## 2021-09-01 ENCOUNTER — Ambulatory Visit (INDEPENDENT_AMBULATORY_CARE_PROVIDER_SITE_OTHER): Payer: Medicare Other

## 2021-09-01 DIAGNOSIS — I639 Cerebral infarction, unspecified: Secondary | ICD-10-CM | POA: Diagnosis not present

## 2021-09-01 LAB — CUP PACEART REMOTE DEVICE CHECK
Date Time Interrogation Session: 20230507230401
Implantable Pulse Generator Implant Date: 20220502

## 2021-09-04 ENCOUNTER — Ambulatory Visit (HOSPITAL_BASED_OUTPATIENT_CLINIC_OR_DEPARTMENT_OTHER)
Admission: RE | Admit: 2021-09-04 | Discharge: 2021-09-04 | Disposition: A | Discharge status: Home / Self Care | Payer: Medicare Other | Source: Ambulatory Visit | Attending: Internal Medicine | Admitting: Internal Medicine

## 2021-09-04 DIAGNOSIS — M8589 Other specified disorders of bone density and structure, multiple sites: Secondary | ICD-10-CM | POA: Insufficient documentation

## 2021-09-04 DIAGNOSIS — Z13820 Encounter for screening for osteoporosis: Secondary | ICD-10-CM | POA: Insufficient documentation

## 2021-09-04 DIAGNOSIS — M85852 Other specified disorders of bone density and structure, left thigh: Secondary | ICD-10-CM | POA: Diagnosis not present

## 2021-09-04 DIAGNOSIS — E039 Hypothyroidism, unspecified: Secondary | ICD-10-CM | POA: Insufficient documentation

## 2021-09-04 DIAGNOSIS — M85851 Other specified disorders of bone density and structure, right thigh: Secondary | ICD-10-CM | POA: Diagnosis not present

## 2021-09-16 NOTE — Progress Notes (Signed)
Carelink Summary Report / Loop Recorder 

## 2021-09-23 ENCOUNTER — Encounter: Payer: Self-pay | Admitting: Neurology

## 2021-09-23 ENCOUNTER — Ambulatory Visit: Payer: Medicare Other | Admitting: Neurology

## 2021-09-23 VITALS — BP 164/88 | HR 69 | Ht 72.0 in | Wt 197.6 lb

## 2021-09-23 DIAGNOSIS — Z8673 Personal history of transient ischemic attack (TIA), and cerebral infarction without residual deficits: Secondary | ICD-10-CM

## 2021-09-23 DIAGNOSIS — G3184 Mild cognitive impairment, so stated: Secondary | ICD-10-CM

## 2021-09-23 MED ORDER — CEREFOLIN 6-1-50-5 MG PO TABS: 1.0000 | ORAL_TABLET | Freq: Every morning | ORAL | 1 refills | Status: DC

## 2021-09-23 NOTE — Progress Notes (Signed)
[facility] [address] [place]. [name] [address] [phone]       OFFICE FOLLOW-UP NOTE  Jeffrey Acosta Date of Birth:  [birth year] Medical Record Number:  [id number]   HPI: [name] is a 71 year old Caucasian male seen today for initial office follow-up visit following hospital admission for subarachnoid hemorrhage in January 2022.  History is obtained from the patient and his wife is accompanying him today as well as review of electronic medical records and personally reviewed pertinent imaging films in PACS.  He has past medical history of bipolar disorder, hypothyroidism, diverticulosis, gastroesophageal reflux disease, hyperlipidemia, history of pia, atrial fibrillation and alcohol and tobacco abuse.  He was admitted on 04/30/2020 to [facility] when he was found to collapsed suddenly at home.  CT scan of the head showed moderate to large amount of acute subarachnoid hemorrhage felt to be consistent with a ruptured aneurysm with small amount of bilateral intraventricular hemorrhage.  CT angiogram was negative for aneurysm or AV malformation.  Patient was managed conservatively and was doing well until 05/11/2020 when he wandered off from the inpatient room for approximately 2 hours.  Patient later reported he had gone to smoke a cigarette but he was found unresponsive and hypothermic outside on the hospital campus.  CT scan of the head showed further regression of the bilateral extra-axial hemorrhage with resolved intraventricular blood.  The patient's family subsequently had reported that the previous day they noticed change in mental status with aphasia and inattention to conversation and decreased responsiveness.  MRI scan of the brain was obtained which showed multiple small acute infarcts in the left basal ganglia, left periatrial temporal lobe, bilateral frontoparietal regions with no associated edema or mass-effect.  EEG showed moderate diffuse  encephalopathy nonspecific in etiology without seizures.  2D echo showed ejection fraction of 55 to 60%.  SARS coronavirus test was negative.  LDL was low at 23 mg percent and hemoglobin A1c was 3.9.  Urine drug screen was negative.  Patient was found to have temperature and elevated white count and started on Zosyn and vancomycin.  He was started on nimodipine for vasospasm prophylaxis.  He underwent diagnostic cerebral catheter angiogram by Dr. Kathyrn Sheriff which was negative for any aneurysm or AV malformation.  Patient informs me that he has atrial fibrillation but was only on aspirin prior to admission.  However I do not find documentation of this in the hospital records.  He had a Zio patch as an outpatient and he has a appointment to discuss results with cardiologist Dr. Audie Box later this week.  Review of his previous physician office visits mention atrial tachycardia diagnosed in December 2020 but no definite mention of A. fib was made.  Patient states is done well since discharge to home._Outpatient physical occupational speech therapy.  His speech is mostly back to normal though still has some occasional word hesitancy and word finding difficulties.  He wants to start driving.  Is fully independent in all activities of daily living.  He has no complaints. Update 10/23/2020: He returns for follow-up after last visit 3 months ago.  Patient denies any headaches or stroke or TIA symptoms.  His main complaint today is short-term memory which seems to be getting worse.  He has trouble remembering recent information but can remember things from the past well.  He still independent and manages her own affairs.  Complains of occasional feeling of lightheadedness and dizziness but that does not last long.  He also  complains of leg aches and pains occasionally feels his balance may also be affected but this does not stay long.  He does have family history of and her grandmother had dementia and worries about it.  He  did undergo a loop recorder insertion and so for paroxysmal A. fib has not yet been found.  He is tolerating aspirin well without bruising or bleeding.  Blood pressure usually well controlled today it is slightly elevated at 150/81. Update 03/27/2021 : He returns for follow-up after last visit 6 months ago.  Patient states his short-term memory and cognitive difficulties remain about unchanged.  He had lab work done for reversible causes of memory loss on 10/23/2020 which were all normal.  EEG on 11/18/2020 showed mild to 67 Hostettler range slowing but no epileptiform activity.  MRI scan of the brain on 11/02/2020 showed no acute abnormalities and stable appearance of bifrontal hygromas and mild changes of generalized atrophy and changes of small vessel disease.  Patient had a fall 2 weeks ago when he tripped on the steps of a new house.  He developed an abrasion on his leg and dislocated his thumb.  He had a CT scan of the head done on 03/14/2021 which showed stable appearance of bifrontal small hygromas and no acute abnormality.  He has had a loop recorder inserted by cardiology and so far atrial tachycardia has been found but no A. fib.  He has no new complaints today. Update 09/23/2021 : He returns for follow-up after last visit 6 months ago.  He is accompanied by his sister.  Patient continues to have short-term memory difficulties.  He forgets phrases and words at times with later on when is not thinking about how they come back.  He feels his cognitive difficulties are about the same and not getting worse.  He scored 28/30 on the Mini-Mental today which is unchanged from last visit.  He has been participating mentally challenging activities like solving crossword puzzles.  He is currently not on any memory enhancement medications.  He remains on aspirin and Plavix both for stroke prevention for unclear reasons is tolerating it well with only minor bruising but no bleeding episodes.  Has had no recurrent stroke  or TIA symptoms.  His blood pressure is usually under good control though today it is slightly elevated in office at 164/88.  He has no new complaints. ROS:   14 system review of systems is positive for fall, thumb dislocation, leg abrasion, memory loss, lightheadedness, dizziness, leg ache and pain, leg weakness altered consciousness, aphasia, weakness, confusion, word finding difficulties all other systems negative  PMH:  Past Medical History:  Diagnosis Date   Alcoholism in remission ([place])    in [place]   Arrhythmia    atrial tachycardia   Bipolar affective disorder ([place])    Depression    Diverticulosis of colon (without mention of hemorrhage)    GERD (gastroesophageal reflux disease)    Gout of multiple sites    Hiatal hernia    Hx of migraines    migraines- last one since 9-10 yrs ago   Hydrocele    HYPERLIPIDEMIA 01/10/2009   Qualifier: Diagnosis of  By: Ronnald Ramp CMA, Chemira    NMR Lipoprofile 2011 LDL could not be calculated  Due to TG of 889  (811  / 750 ),  HDL 50 . Father MI @ 5 PGM CVA early 75s; PGF CVA '@72'$     Hypothyroidism    Melanoma of skin, site unspecified 11/25/2012  07/2011 abdominal  Stage 1 Dr Amy Martinique Dr Sarajane Jews and chest   Osteopenia    Polyarthralgia    Status post placement of implantable loop recorder 08/26/2020   Stroke [facility])    x 3 - last one 04/2020   Subarachnoid hemorrhage ([place])     Social History:  Social History   Socioeconomic History   Marital status: Married    Spouse name: [name]   Number of children: 3   Years of education: Not on file   Highest education level: Not on file  Occupational History   Occupation: retired  Tobacco Use   Smoking status: Never   Smokeless tobacco: Never  Vaping Use   Vaping Use: Never used  Substance and Sexual Activity   Alcohol use: Not Currently    Comment: currently sober since 06/2020   Drug use: No   Sexual activity: Yes    Partners: Female    Comment: Having some issues with ED.  Would like  provider's recommendation.    Other Topics Concern   Not on file  Social History Narrative   Lives with Wife, [name]   Right Handed   Drinks 4-5 cups caffeine/week   Social Determinants of Health   Financial Resource Strain: Not on file  Food Insecurity: Not on file  Transportation Needs: Not on file  Physical Activity: Not on file  Stress: Not on file  Social Connections: Not on file  Intimate Partner Violence: Not on file    Medications:   Current Outpatient Medications on File Prior to Visit  Medication Sig Dispense Refill   allopurinol (ZYLOPRIM) 300 MG tablet TAKE 1 TABLET(300 MG) BY MOUTH DAILY 90 tablet 1   Cholecalciferol (VITAMIN D) 50 MCG (2000 UT) tablet Take 2,000 Units by mouth daily.     clopidogrel (PLAVIX) 75 MG tablet Take 1 tablet (75 mg total) by mouth daily. 90 tablet 1   esomeprazole (NEXIUM) 40 MG capsule TAKE 1 CAPSULE(40 MG) BY MOUTH DAILY BEFORE BREAKFAST 90 capsule 1   fluticasone (FLONASE) 50 MCG/ACT nasal spray Place 2 sprays into both nostrils daily as needed for congestion.     lamoTRIgine (LAMICTAL) 25 MG tablet Take 25 mg by mouth daily.     levothyroxine (SYNTHROID) 50 MCG tablet TAKE 1 TABLET(50 MCG) BY MOUTH DAILY BEFORE BREAKFAST 90 tablet 1   metoprolol succinate (TOPROL-XL) 100 MG 24 hr tablet TAKE 1 AND 1/2 TABLETS(150 MG) BY MOUTH DAILY WITH OR IMMEDIATELY FOLLOWING A MEAL 135 tablet 1   naltrexone (DEPADE) 50 MG tablet Take 50 mg by mouth daily.   12   QUEtiapine (SEROQUEL) 400 MG tablet Take 800 mg by mouth at bedtime.     Thiamine HCl (VITAMIN B-1) 250 MG tablet Take 1 tablet (250 mg total) by mouth daily. 90 tablet 3   timolol (TIMOPTIC) 0.5 % ophthalmic solution Place 1 drop into both eyes 2 (two) times daily.     celecoxib (CELEBREX) 100 MG capsule Take 1 capsule (100 mg total) by mouth 2 (two) times daily. (Patient not taking: Reported on 08/06/2021) 60 capsule 0   No current facility-administered medications on file prior to visit.     Allergies:   No Known Allergies   Physical Exam General: well developed, well nourished elderly Caucasian male, seated, in no evident distress Head: head normocephalic and atraumatic.  Neck: supple with no carotid or supraclavicular bruits Cardiovascular: regular rate and rhythm, no murmurs Musculoskeletal: no deformity Skin:  no rash/petichiae Vascular:  Normal pulses all  extremities Vitals:   09/23/21 1403  BP: (!) 164/88  Pulse: 69   Neurologic Exam Mental Status: Awake and fully alert. Oriented to place and time. Recent and remote memory slightly diminished.  Attention span, concentration and fund of knowledge appropriate. Mood and affect appropriate.  Diminished recall 2/3.  Able to name 8 animals which can walk on 4 legs.  Clock drawing 4/4.  Mini-Mental status exam score 28/30 with 1 deficit in recall and orientation.  Clock drawing 3/4.  Able to name 11 animals which can walk on 4 legs Cranial Nerves: Fundoscopic exam reveals sharp disc margins. Pupils equal, briskly reactive to light. Extraocular movements full without nystagmus. Visual fields full to confrontation. Hearing intact. Facial sensation intact. Face, tongue, palate moves normally and symmetrically.  Motor: Normal bulk and tone. Normal strength in all tested extremity muscles. Sensory.: intact to touch ,pinprick .position and vibratory sensation.  Coordination: Rapid alternating movements normal in all extremities. Finger-to-nose and heel-to-shin performed accurately bilaterally. Gait and Station: Arises from chair without difficulty. Stance is normal. Gait demonstrates normal stride length and balance . Able to heel, toe and tandem walk with moderate difficulty.  Reflexes: 1+ and symmetric. Toes downgoing.       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  Doubt significant vasospasm contributing as it was not clearly documented on transcranial Doppler study and many of the infarcts were subcortical.  He also has  mild short-term memory and cognitive difficulties likely from mild cognitive impairment which appear stable    PLAN: I had a long discussion with the patient and his sister regarding his memory loss and mild cognitive impairment which appears to be stable.  I recommend he continue participating in cognitively challenging activities like solving crossword puzzles, playing bridge and sudoku and word searches.  He was also encouraged to continue to do memory compensation strategies.  Trial of Cerefolin NAC 1 tablet daily to see if it helps with his memory loss.  Recommended discontinue folic acid and vitamin B12 which is taking separately which he no longer needs.  Discontinue aspirin for stroke prevention and stay on Plavix alone and maintain aggressive risk factor modification strict control of hypertension blood pressure goal below 140/90, lipids with LDL cholesterol goal below 70 mg percent and diabetes with hemoglobin A1c goal below 6.5%.  Return for follow-up in the future in 6 months or call earlier if necessary. Greater than 50% of time during this 40-minute visit minute visit was spent on counseling,explanation of diagnosis of subarachnoid hemorrhage, memory loss and mild cognitive impairment and stroke risk, planning of further management, discussion with patient and family and coordination of care Antony Contras, MD Note: This document was prepared  with digital dictation and possible smart phrase technology. Any transcriptional errors that result from this process are unintentional

## 2021-09-23 NOTE — Patient Instructions (Signed)
I had a long discussion with the patient and his sister regarding his memory loss and mild cognitive impairment which appears to be stable.  I recommend he continue participating in cognitively challenging activities like solving crossword puzzles, playing bridge and sudoku and word searches.  He was also encouraged to continue to do memory compensation strategies.  Trial of Cerefolin NAC 1 tablet daily to see if it helps with his memory loss.  Recommended discontinue folic acid and vitamin B12 which is taking separately which he no longer needs.  Discontinue aspirin for stroke prevention and stay on Plavix alone and maintain aggressive risk factor modification strict control of hypertension blood pressure goal below 140/90, lipids with LDL cholesterol goal below 70 mg percent and diabetes with hemoglobin A1c goal below 6.5%.  Return for follow-up in the future in 6 months or call earlier if necessary.

## 2021-10-06 ENCOUNTER — Ambulatory Visit (INDEPENDENT_AMBULATORY_CARE_PROVIDER_SITE_OTHER): Payer: Medicare Other

## 2021-10-06 DIAGNOSIS — I639 Cerebral infarction, unspecified: Secondary | ICD-10-CM | POA: Diagnosis not present

## 2021-10-09 LAB — CUP PACEART REMOTE DEVICE CHECK
Date Time Interrogation Session: 20230615085753
Implantable Pulse Generator Implant Date: 20220502

## 2021-10-31 ENCOUNTER — Other Ambulatory Visit: Payer: Self-pay | Admitting: Internal Medicine

## 2021-11-06 ENCOUNTER — Encounter: Payer: Self-pay | Admitting: Internal Medicine

## 2021-11-06 ENCOUNTER — Ambulatory Visit (INDEPENDENT_AMBULATORY_CARE_PROVIDER_SITE_OTHER): Payer: Medicare Other | Admitting: Internal Medicine

## 2021-11-06 VITALS — BP 132/82 | HR 61 | Temp 98.0°F | Resp 16 | Ht 72.0 in | Wt 202.2 lb

## 2021-11-06 DIAGNOSIS — G3184 Mild cognitive impairment, so stated: Secondary | ICD-10-CM

## 2021-11-06 DIAGNOSIS — F319 Bipolar disorder, unspecified: Secondary | ICD-10-CM

## 2021-11-06 DIAGNOSIS — E781 Pure hyperglyceridemia: Secondary | ICD-10-CM | POA: Diagnosis not present

## 2021-11-06 LAB — CUP PACEART REMOTE DEVICE CHECK
Date Time Interrogation Session: 20230713094008
Implantable Pulse Generator Implant Date: 20220502

## 2021-11-06 MED ORDER — SHINGRIX 50 MCG/0.5ML IM SUSR: 0.5000 mL | Freq: Once | INTRAMUSCULAR | 1 refills | Status: AC

## 2021-11-06 MED ORDER — ATORVASTATIN CALCIUM 20 MG PO TABS: 20.0000 mg | ORAL_TABLET | Freq: Every day | ORAL | 0 refills | Status: DC

## 2021-11-06 NOTE — Patient Instructions (Addendum)
Start taking atorvastatin 20 mg every night (this is a cholesterol medication).  Come back in 6 weeks for blood work.  Please make an appointment  Come back for a routine checkup in 4 months.  Also make an appointment  Recommend to proceed with the following vaccines at your pharmacy:  Shingrix (shingles) Covid booster (bivalent) Flu shot this fall

## 2021-11-06 NOTE — Assessment & Plan Note (Signed)
Hyperlipidemia: Currently on no statins, on reviewing neurology last note they recommend a LDL below 70.  Start atorvastatin, will check a FLP, AST, ALT in 6 weeks. TIA?  See my last visit note, he probably had a TIA,, aspirin changed to Plavix.  Saw neurology 09/23/2021.  No further changes or imaging recommended.  No further symptoms. MCI: Saw neurology 09/23/2021, started Cerefolin. Bipolar disorder: Reports he is feeling very well mentally. Back pain: Not taking Celebrex, iself d/c we agreed to take just Tylenol. Preventive care: Recommend Shingrix, COVID booster, flu shot.  See AVS RTC labs 6 weeks RTC checkup 25-month

## 2021-11-06 NOTE — Progress Notes (Signed)
Subjective:    Patient ID: Jeffrey Acosta, male    DOB: 1950/09/17, 71 y.o.   MRN: [id number]  DOS:  11/06/2021 Type of visit - description: Routine visit, here by himself  Since the last office visit is doing well. No further TIA-like symptoms. Saw neurology, note reviewed. Reports he is feeling really good. No chest pain or difficulty breathing.  No palpitations.  Review of Systems See above   Past Medical History:  Diagnosis Date   Alcoholism in remission ([place])    in AA   Arrhythmia    atrial tachycardia   Bipolar affective disorder ([place])    Depression    Diverticulosis of colon (without mention of hemorrhage)    GERD (gastroesophageal reflux disease)    Gout of multiple sites    Hiatal hernia    Hx of migraines    migraines- last one since 9-10 yrs ago   Hydrocele    HYPERLIPIDEMIA 01/10/2009   Qualifier: Diagnosis of  By: Ronnald Ramp CMA, Chemira    NMR Lipoprofile 2011 LDL could not be calculated  Due to TG of 889  (811  / 750 ),  HDL 50 . Father MI @ 59 PGM CVA early 46s; PGF CVA '@72'$     Hypothyroidism    Melanoma of skin, site unspecified 11/25/2012   07/2011 abdominal  Stage 1 Dr Amy Martinique Dr Sarajane Jews and chest   Osteopenia    Polyarthralgia    Status post placement of implantable loop recorder 08/26/2020   Stroke ([place])    x 3 - last one 04/2020   Subarachnoid hemorrhage [facility])     Past Surgical History:  Procedure Laterality Date   BILIARY BRUSHING  12/07/2018   Procedure: BILIARY BRUSHING;  Surgeon: Irving Copas., MD;  Location: [facility];  Service: Gastroenterology;;   BILIARY DILATION  12/07/2018   Procedure: BILIARY DILATION;  Surgeon: Irving Copas., MD;  Location: [facility];  Service: Gastroenterology;;   BIOPSY  12/07/2018   Procedure: BIOPSY;  Surgeon: Irving Copas., MD;  Location: [facility];  Service: Gastroenterology;;   CHOLECYSTECTOMY N/A 01/29/2021   Procedure: LAPAROSCOPIC CHOLECYSTECTOMY WITH ICG DYE;   Surgeon: Rolm Bookbinder, MD;  Location: [place];  Service: General;  Laterality: N/A;   CLOSED REDUCTION FINGER WITH PERCUTANEOUS PINNING Right 04/04/2021   Procedure: CLOSED REDUCTION WITH PERCUTANEOUS PINNING RIGHT THUMB;  Surgeon: Sherilyn Cooter, MD;  Location: [place];  Service: Orthopedics;  Laterality: Right;   COLONOSCOPY  2003   negative ; Dr Sharlett Iles   ERCP N/A 12/07/2018   Procedure: ENDOSCOPIC RETROGRADE CHOLANGIOPANCREATOGRAPHY (ERCP);  Surgeon: Irving Copas., MD;  Location: [facility];  Service: Gastroenterology;  Laterality: N/A;   ESOPHAGOGASTRODUODENOSCOPY (EGD) WITH PROPOFOL N/A 12/07/2018   Procedure: ESOPHAGOGASTRODUODENOSCOPY (EGD) WITH PROPOFOL;  Surgeon: Rush Landmark Telford Nab., MD;  [facility];  Service: Gastroenterology;  Laterality: N/A;   EUS N/A 12/07/2018   Procedure: UPPER ENDOSCOPIC ULTRASOUND (EUS) RADIAL;  Surgeon: Irving Copas., MD;  [facility];  Service: Gastroenterology;  Laterality: N/A;   INGUINAL HERNIA REPAIR  08/07/2011   Procedure: HERNIA REPAIR INGUINAL ADULT;  Surgeon: Rolm Bookbinder, MD;  Location: [place];  Service: General;  Laterality: Right;   IR ANGIO INTRA EXTRACRAN SEL INTERNAL CAROTID BILAT MOD SED  05/14/2020   IR ANGIO VERTEBRAL SEL VERTEBRAL BILAT MOD SED  05/14/2020   IR GUIDED DRAIN W CATHETER PLACEMENT  02/05/2021   IR RADIOLOGIST EVAL & MGMT  02/13/2021   LOOP RECORDER INSERTION  06/2020   last remote check was on 03/12/21   MELANOMA EXCISION  07/28/2011   Dr Sarajane Jews; stomach and chest   REMOVAL OF STONES  12/07/2018   Procedure: REMOVAL OF STONES;  Surgeon: Irving Copas., MD;  [facility];  Service: Gastroenterology;;   TONSILLECTOMY  1951   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   UPPER GASTROINTESTINAL ENDOSCOPY  1983   hiatal hernia   VASECTOMY  1979    Current Outpatient Medications  Medication Instructions   allopurinol  (ZYLOPRIM) 300 MG tablet TAKE 1 TABLET(300 MG) BY MOUTH DAILY   atorvastatin (LIPITOR) 20 mg, Oral, Daily at bedtime   clopidogrel (PLAVIX) 75 mg, Oral, Daily   esomeprazole (NEXIUM) 40 MG capsule TAKE 1 CAPSULE(40 MG) BY MOUTH DAILY BEFORE BREAKFAST   fluticasone (FLONASE) 50 MCG/ACT nasal spray 2 sprays, Each Nare, Daily PRN   L-Methylfolate-B12-B6-B2 (CEREFOLIN) 09-25-48-5 MG TABS 1 tablet, Oral, Every morning   lamoTRIgine (LAMICTAL) 25 mg, Oral, Daily   levothyroxine (SYNTHROID) 50 mcg, Oral, Daily before breakfast   metoprolol succinate (TOPROL-XL) 100 MG 24 hr tablet TAKE 1 AND 1/2 TABLETS(150 MG) BY MOUTH DAILY WITH OR IMMEDIATELY FOLLOWING A MEAL   naltrexone (DEPADE) 50 mg, Oral, Daily   QUEtiapine (SEROQUEL) 800 mg, Oral, Daily at bedtime   timolol (TIMOPTIC) 0.5 % ophthalmic solution 1 drop, Both Eyes, 2 times daily   vitamin B-1 250 mg, Oral, Daily   Vitamin D 2,000 Units, Oral, Daily   Zoster Vaccine Adjuvanted (SHINGRIX) injection 0.5 mLs, Intramuscular,  Once       Objective:   Physical Exam BP 132/82   Pulse 61   Temp 98 F (36.7 C) (Oral)   Resp 16   Ht 6' (1.829 m)   Wt 202 lb 4 oz (91.7 kg)   SpO2 98%   BMI 27.43 kg/m  General:   Well developed, NAD, BMI noted. HEENT:  Normocephalic . Face symmetric, atraumatic Lungs:  CTA B Normal respiratory effort, no intercostal retractions, no accessory muscle use. Heart: RRR,  no murmur.  Lower extremities: no pretibial edema bilaterally  Skin: Not pale. Not jaundice Neurologic:  alert & oriented X3.  Speech normal, gait appropriate for age and unassisted Psych--  Cognition and judgment appear intact.  Cooperative with normal attention span and concentration.  Behavior appropriate. No anxious or depressed appearing.      Assessment    Assessment  Bipolar disorder-- Dr Toy Care, dc lithium 07-2014 EtOH abuse-- remission w/ occ relapse Hypothyroidism Hyperlipidemia E.D.  Atrial Tachycardia dx 03/2019, + Zio  patch,  MSK: ---Back pain: Saw Dr. Ramos/Geoffrey~ 2002, 3 local injections, offered surgery.  ---Gout: Dr Berna Bue, released to PCP 2018 Melanoma 2014 Dr. Martinique, sees derm regularly as off 07-2021 Osteopenia  T score -2.4  ( [phone];  T score is -2.0  (08/2018). 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  07-2021, ASA changed to Plavix   PLAN Hyperlipidemia: Currently on no statins, on reviewing neurology last note they recommend a LDL below 70.  Start atorvastatin, will check a FLP, AST, ALT in 6 weeks. TIA?  See my last visit note, he probably had a TIA,, aspirin changed to Plavix.  Saw neurology 09/23/2021.  No further changes or imaging recommended.  No further symptoms. MCI: Saw neurology 09/23/2021, started Cerefolin. Bipolar disorder: Reports he is feeling very well mentally. Back pain: Not taking Celebrex, iself d/c we agreed to take just Tylenol. Preventive care: Recommend Shingrix, COVID booster, flu shot.  See AVS RTC labs 6 weeks RTC checkup 72-month

## 2021-11-10 ENCOUNTER — Ambulatory Visit (INDEPENDENT_AMBULATORY_CARE_PROVIDER_SITE_OTHER): Payer: Medicare Other

## 2021-11-10 DIAGNOSIS — I639 Cerebral infarction, unspecified: Secondary | ICD-10-CM

## 2021-11-25 NOTE — Progress Notes (Unsigned)
Subjective:   Jeffrey Acosta is a 71 y.o. male who presents for Medicare Annual/Subsequent preventive examination.  Review of Systems     Cardiac Risk Factors include: advanced age (>31mn, >>67women);male gender;hypertension;dyslipidemia     Objective:    Today's Vitals   11/26/21 0813  BP: 92/70  Pulse: 91  Resp: 16  Temp: 97.7 F (36.5 C)  SpO2: 93%  Weight: 204 lb (92.5 kg)  Height: '6\' 1"'$  (1.854 m)   Body mass index is 26.91 kg/m.     11/26/2021    8:15 AM 03/14/2021   12:26 PM 02/05/2021    1:39 PM 01/29/2021    1:02 PM 01/22/2021    9:02 AM 10/15/2020   11:53 AM 05/20/2020    7:23 AM  Advanced Directives  Does Patient Have a Medical Advance Directive? No Yes Yes Yes Yes No Yes  Type of [place] will [place] will  [place] Does patient want to make changes to medical advance directive?   No - Patient declined  No - Patient declined  Yes (MAU/Ambulatory/Procedural Areas - Information given)  Copy of [place] Chart?   No - copy requested No - copy requested No - copy requested    Would patient like information on creating a medical advance directive? No - Patient declined     No - Patient declined     Current Medications (verified) Outpatient Encounter Medications as of 11/26/2021  Medication Sig   allopurinol (ZYLOPRIM) 300 MG tablet TAKE 1 TABLET(300 MG) BY MOUTH DAILY   atorvastatin (LIPITOR) 20 MG tablet Take 1 tablet (20 mg total) by mouth at bedtime.   Cholecalciferol (VITAMIN D) 50 MCG (2000 UT) tablet Take 2,000 Units by mouth daily.   clopidogrel (PLAVIX) 75 MG tablet Take 1 tablet (75 mg total) by mouth daily.   esomeprazole (NEXIUM) 40 MG capsule TAKE 1 CAPSULE(40 MG) BY MOUTH DAILY BEFORE BREAKFAST   fluticasone (FLONASE) 50 MCG/ACT nasal spray Place 2 sprays into both nostrils daily as needed for congestion.    L-Methylfolate-B12-B6-B2 (CEREFOLIN) 09-25-48-5 MG TABS Take 1 tablet by mouth every morning.   lamoTRIgine (LAMICTAL) 25 MG tablet Take 25 mg by mouth daily.   levothyroxine (SYNTHROID) 50 MCG tablet Take 1 tablet (50 mcg total) by mouth daily before breakfast.   metoprolol succinate (TOPROL-XL) 100 MG 24 hr tablet TAKE 1 AND 1/2 TABLETS(150 MG) BY MOUTH DAILY WITH OR IMMEDIATELY FOLLOWING A MEAL   naltrexone (DEPADE) 50 MG tablet Take 50 mg by mouth daily.    QUEtiapine (SEROQUEL) 400 MG tablet Take 800 mg by mouth at bedtime.   Thiamine HCl (VITAMIN B-1) 250 MG tablet Take 1 tablet (250 mg total) by mouth daily.   timolol (TIMOPTIC) 0.5 % ophthalmic solution Place 1 drop into both eyes 2 (two) times daily.   No facility-administered encounter medications on file as of 11/26/2021.    Allergies (verified) Patient has no known allergies.   History: Past Medical History:  Diagnosis Date   [place]    in [place]   Arrhythmia    atrial tachycardia   [place] disorder [place]    Depression    Diverticulosis of colon (without mention of hemorrhage)    GERD (gastroesophageal reflux disease)    Gout of multiple sites    Hiatal hernia    Hx of migraines  migraines- last one since 9-10 yrs ago   Hydrocele    HYPERLIPIDEMIA 01/10/2009   Qualifier: Diagnosis of  By: Ronnald Ramp CMA, Chemira    NMR Lipoprofile 2011 LDL could not be calculated  Due to TG of 889  (811  / 750 ),  HDL 50 . Father MI @ 43 PGM CVA early 49s; PGF CVA '@72'$     Hypothyroidism    Melanoma of skin, site unspecified 11/25/2012   07/2011 abdominal  Stage 1 Dr Amy Martinique Dr Sarajane Jews and chest   Osteopenia    Polyarthralgia    Status post placement of implantable loop recorder 08/26/2020   Stroke ([place])    x 3 - last one 04/2020   Subarachnoid hemorrhage [facility])    Past Surgical History:  Procedure Laterality Date   BILIARY BRUSHING  12/07/2018   Procedure: BILIARY BRUSHING;  Surgeon: Irving Copas., MD;  [facility]: [facility];  Service: Gastroenterology;;   BILIARY DILATION  12/07/2018   Procedure: BILIARY DILATION;  Surgeon: Irving Copas., MD;  [facility];  Service: Gastroenterology;;   BIOPSY  12/07/2018   Procedure: BIOPSY;  Surgeon: Irving Copas., MD;  [facility];  Service: Gastroenterology;;   CHOLECYSTECTOMY N/A 01/29/2021   Procedure: LAPAROSCOPIC CHOLECYSTECTOMY WITH ICG DYE;  Surgeon: Rolm Bookbinder, MD;  Location: [place];  Service: General;  Laterality: N/A;   CLOSED REDUCTION FINGER WITH PERCUTANEOUS PINNING Right 04/04/2021   Procedure: CLOSED REDUCTION WITH PERCUTANEOUS PINNING RIGHT THUMB;  Surgeon: Sherilyn Cooter, MD;  Location: [place];  Service: Orthopedics;  Laterality: Right;   COLONOSCOPY  2003   negative ; Dr Sharlett Iles   ERCP N/A 12/07/2018   Procedure: ENDOSCOPIC RETROGRADE CHOLANGIOPANCREATOGRAPHY (ERCP);  Surgeon: Irving Copas., MD;  [facility]: [facility];  Service: Gastroenterology;  Laterality: N/A;   ESOPHAGOGASTRODUODENOSCOPY (EGD) WITH PROPOFOL N/A 12/07/2018   Procedure: ESOPHAGOGASTRODUODENOSCOPY (EGD) WITH PROPOFOL;  Surgeon: Rush Landmark Telford Nab., MD;  [facility];  Service: Gastroenterology;  Laterality: N/A;   EUS N/A 12/07/2018   Procedure: UPPER ENDOSCOPIC ULTRASOUND (EUS) RADIAL;  Surgeon: Irving Copas., MD;  [facility];  Service: Gastroenterology;  Laterality: N/A;   INGUINAL HERNIA REPAIR  08/07/2011   Procedure: HERNIA REPAIR INGUINAL ADULT;  Surgeon: Rolm Bookbinder, MD;  Location: [place];  Service: General;  Laterality: Right;   IR ANGIO INTRA EXTRACRAN SEL INTERNAL CAROTID BILAT MOD SED  05/14/2020   IR ANGIO VERTEBRAL SEL VERTEBRAL BILAT MOD SED  05/14/2020   IR GUIDED DRAIN W CATHETER PLACEMENT  02/05/2021   IR RADIOLOGIST EVAL & MGMT  02/13/2021   LOOP RECORDER INSERTION  06/2020   last remote check was on 03/12/21    MELANOMA EXCISION  07/28/2011   Dr Sarajane Jews; stomach and chest   REMOVAL OF STONES  12/07/2018   Procedure: REMOVAL OF STONES;  Surgeon: Irving Copas., MD;  [facility];  Service: Gastroenterology;;   TONSILLECTOMY  1951   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   UPPER GASTROINTESTINAL ENDOSCOPY  1983   hiatal hernia   VASECTOMY  1979   Family History  Problem Relation Age of Onset   COPD Father    Heart attack Father 67   COPD Mother    Diabetes Paternal Grandmother    Parkinsonism Paternal Grandmother    Stroke Paternal Grandmother 61   Stroke Maternal Grandfather 72   Lung cancer Paternal Grandfather        Heavy smoker   Colon  cancer Neg Hx    Prostate cancer Neg Hx    Esophageal cancer Neg Hx    Rectal cancer Neg Hx    Stomach cancer Neg Hx    Social History   Socioeconomic History   Marital status: Married    Spouse name: Jeffrey Acosta   Number of children: 3   Years of education: Not on file   Highest education level: Not on file  Occupational History   Occupation: retired  Tobacco Use   Smoking status: Never   Smokeless tobacco: Never  Vaping Use   Vaping Use: Never used  Substance and Sexual Activity   Alcohol use: Not Currently    Comment: currently sober since 06/2020   Drug use: No   Sexual activity: Yes    Partners: Female    Comment: Having some issues with ED.  Would like provider's recommendation.    Other Topics Concern   Not on file  Social History Narrative   Lives with Wife, Jeffrey Acosta   Right Handed   Drinks 4-5 cups caffeine/week   Social Determinants of Health   Financial Resource Strain: Low Risk  (11/26/2021)   Overall Financial Resource Strain (CARDIA)    Difficulty of Paying Living Expenses: Not hard at all  Food Insecurity: No Food Insecurity (11/26/2021)   Hunger Vital Sign    Worried About Running Out of Food in the Last Year: Never true    Ran Out of Food in the Last Year: Never true  Transportation Needs: No  Transportation Needs (11/26/2021)   PRAPARE - Hydrologist (Medical): No    Lack of Transportation (Non-Medical): No  Physical Activity: Insufficiently Active (11/26/2021)   Exercise Vital Sign    Days of Exercise per Week: 3 days    Minutes of Exercise per Session: 40 min  Stress: No Stress Concern Present (11/26/2021)   [place]    Feeling of Stress : Not at all  Social Connections: Moderately Integrated (11/26/2021)   Social Connection and Isolation Panel [NHANES]    Frequency of Communication with Friends and Family: More than three times a week    Frequency of Social Gatherings with Friends and Family: More than three times a week    Attends Religious Services: Never    Marine scientist or Organizations: Yes    Attends Music therapist: More than 4 times per year    Marital Status: Married    Tobacco Counseling Counseling given: Not Answered   Clinical Intake:  Pre-visit preparation completed: Yes  Pain : No/denies pain     BMI - recorded: 26.91 Nutritional Status: BMI 25 -29 Overweight Nutritional Risks: None Diabetes: No  How often do you need to have someone help you when you read instructions, pamphlets, or other written materials from your doctor or pharmacy?: 1 - Never  Diabetic?no  Interpreter Needed?: No  Information entered by :: Karletta Millay   Activities of Daily Living    11/26/2021    8:17 AM 04/04/2021   10:47 AM  In your present state of health, do you have any difficulty performing the following activities:  Hearing? 1   Vision? 0   Difficulty concentrating or making decisions? 0   Walking or climbing stairs? 0   Dressing or bathing? 0   Doing errands, shopping? 0 0  Preparing Food and eating ? N   Using the Toilet? N   In the past six  months, have you accidently leaked urine? N   Do you have problems with loss of bowel control? N    Managing your Medications? N   Managing your Finances? N   Housekeeping or managing your Housekeeping? N     Patient Care Team: Colon Branch, MD as PCP - General (Internal Medicine) O'Neal, Cassie Freer, MD as PCP - Cardiology (Cardiology) Chucky May, MD as Consulting Physician (Psychiatry) Kathrynn Ducking, MD (Inactive) as Consulting Physician (Neurology) Tat, Eustace Quail, DO as Consulting Physician (Neurology) Marica Otter, [place] as Consulting Physician (Optometry) Irene Shipper, MD as Consulting Physician (Gastroenterology) Demetrius Revel, DDS as Consulting Physician (Dentistry) Gavin Pound, MD as Consulting Physician (Rheumatology) Martinique, Amy, MD as Consulting Physician (Dermatology)  Indicate any recent Medical Services you may have received from other than [facility] providers in the past year (date may be approximate).     Assessment:   This is a routine wellness examination for Jsiah.  Hearing/Vision screen No results found.  Dietary issues and exercise activities discussed: Current Exercise Habits: Home exercise routine, Type of exercise: walking, Time (Minutes): 40, Frequency (Times/Week): 3, Weekly Exercise (Minutes/Week): 120, Intensity: Mild, Exercise limited by: None identified   Goals Addressed             This Visit's Progress    Increase physical activity   Not on track    Primarily walking on a regular basis.         Depression Screen    11/26/2021    1:28 PM 11/06/2021    9:52 AM 08/06/2021   10:35 AM 11/27/2020   10:31 AM 10/16/2019    9:02 AM 07/04/2018    9:44 AM 10/27/2017    8:01 AM  PHQ 2/9 Scores  PHQ - 2 Score 1 0 0 0 0 0 0  PHQ- 9 Score     0 0   Exception Documentation       Other- indicate reason in comment box  Not completed       per psych    Fall Risk    11/26/2021    8:15 AM 11/06/2021    9:51 AM 08/06/2021   10:35 AM 03/19/2021    3:08 PM 11/27/2020   10:30 AM  Fall Risk   Falls in the past year? 0 0 '1 1 1  '$ Number falls in  past yr: 0 0 0 0 1  Injury with Fall? 0 0 1 1 0  Risk for fall due to : No Fall Risks      Follow up Falls evaluation completed Falls evaluation completed Falls evaluation completed Falls evaluation completed Falls evaluation completed    [place]:  Any stairs in or around the home? Yes  If so, are there any without handrails? No  Home free of loose throw rugs in walkways, pet beds, electrical cords, etc? Yes  Adequate lighting in your home to reduce risk of falls? Yes   ASSISTIVE DEVICES UTILIZED TO PREVENT FALLS:  Life alert? No  Use of a cane, walker or w/c? No  Grab bars in the bathroom? Yes  Shower chair or bench in shower? Yes  Elevated toilet seat or a handicapped toilet? Yes   TIMED UP AND GO:  Was the test performed? Yes .  Length of time to ambulate 10 feet: 10 sec.   Gait steady and fast without use of assistive device  Cognitive Function:    09/23/2021    2:09 PM 03/27/2021  2:58 PM  MMSE - Mini Mental State Exam  Orientation to time 5 5  Orientation to Place 5 4  Registration 3 3  Attention/ Calculation 4 5  Recall 3 2  Language- name 2 objects 2 2  Language- repeat 1 1  Language- follow 3 step command 3 3  Language- read & follow direction 1 1  Write a sentence 1 1  Copy design 0 1  Total score 28 28        11/26/2021    8:20 AM  6CIT Screen  What Year? 0 points  What month? 0 points  What time? 0 points  Count back from 20 2 points  Months in reverse 0 points  Repeat phrase 0 points  Total Score 2 points    Immunizations Immunization History  Administered Date(s) Administered   Fluad Quad(high Dose 65+) 01/20/2019, 03/14/2020, 03/19/2021   Influenza Split 02/02/2012   Influenza Whole 03/17/2007, 02/08/2008, 02/07/2009, 05/20/2010   Influenza, High Dose Seasonal PF 03/20/2015, 03/23/2016, 01/21/2017   Influenza,inj,Quad PF,6+ Mos 01/08/2014   Influenza-Unspecified 11/25/2017   PFIZER(Purple  Top)SARS-COV-2 Vaccination 06/03/2019, 06/28/2019, 09/18/2020   Pneumococcal Conjugate-13 03/20/2015   Pneumococcal Polysaccharide-23 02/17/2008, 03/03/2018   Tdap 11/29/2012    TDAP status: Up to date  Flu Vaccine status: Up to date  Pneumococcal vaccine status: Up to date  Covid-19 vaccine status: Information provided on how to obtain vaccines.   Qualifies for Shingles Vaccine? Yes   Zostavax completed No   Shingrix Completed?: No.    Education has been provided regarding the importance of this vaccine. Patient has been advised to call insurance company to determine out of pocket expense if they have not yet received this vaccine. Advised may also receive vaccine at local pharmacy or Health Dept. Verbalized acceptance and understanding.  Screening Tests Health Maintenance  Topic Date Due   Zoster Vaccines- Shingrix (1 of 2) Never done   COVID-19 Vaccine (4 - Booster for Pfizer series) 11/13/2020   INFLUENZA VACCINE  11/25/2021   TETANUS/TDAP  11/30/2022   COLONOSCOPY (Pts 45-35yr Insurance coverage will need to be confirmed)  11/23/2023   Pneumonia Vaccine 71 Years old  Completed   Hepatitis C Screening  Completed   HPV VACCINES  Aged Out    Health Maintenance  Health Maintenance Due  Topic Date Due   Zoster Vaccines- Shingrix (1 of 2) Never done   COVID-19 Vaccine (4 - Booster for Pfizer series) 11/13/2020   INFLUENZA VACCINE  11/25/2021    Colorectal cancer screening: Type of screening: Colonoscopy. Completed 11/22/13. Repeat every 10 years  Lung Cancer Screening: (Low Dose CT Chest recommended if Age 73-80years, 30 pack-year currently smoking OR have quit w/in 15years.) does not qualify.   Lung Cancer Screening Referral: n/a  Additional Screening:  Hepatitis C Screening: does qualify; Completed 07/19/18  Vision Screening: Recommended annual ophthalmology exams for early detection of glaucoma and other disorders of the eye. Is the patient up to date with their  annual eye exam?  No  Who is the provider or what is the name of the office in which the patient attends annual eye exams? Dr. MSabra HeckIf pt is not established with a provider, would they like to be referred to a provider to establish care? No .   Dental Screening: Recommended annual dental exams for proper oral hygiene  Community Resource Referral / Chronic Care Management: CRR required this visit?  No   CCM required this visit?  No  Plan:     I have personally reviewed and noted the following in the patient's chart:   Medical and social history Use of alcohol, tobacco or illicit drugs  Current medications and supplements including opioid prescriptions. Patient is not currently taking opioid prescriptions. Functional ability and status Nutritional status Physical activity Advanced directives List of other physicians Hospitalizations, surgeries, and ER visits in previous 12 months Vitals Screenings to include cognitive, depression, and falls Referrals and appointments  In addition, I have reviewed and discussed with patient certain preventive protocols, quality metrics, and best practice recommendations. A written personalized care plan for preventive services as well as general preventive health recommendations were provided to patient.     Duard Brady Caysen Whang, [place]   11/26/2021   Nurse Notes: none   I have reviewed and agree with Health Coaches documentation.  Kathlene November, MD

## 2021-11-26 ENCOUNTER — Ambulatory Visit (INDEPENDENT_AMBULATORY_CARE_PROVIDER_SITE_OTHER): Payer: Medicare Other

## 2021-11-26 VITALS — BP 92/70 | HR 91 | Temp 97.7°F | Resp 16 | Ht 73.0 in | Wt 204.0 lb

## 2021-11-26 DIAGNOSIS — Z0000 Encounter for general adult medical examination without abnormal findings: Secondary | ICD-10-CM | POA: Diagnosis not present

## 2021-11-26 NOTE — Patient Instructions (Signed)
Jeffrey Acosta , Thank you for taking time to come for your Medicare Wellness Visit. I appreciate your ongoing commitment to your health goals. Please review the following plan we discussed and let me know if I can assist you in the future.   Screening recommendations/referrals: Colonoscopy: 11/22/13 due 11/23/23 Recommended yearly ophthalmology/optometry visit for glaucoma screening and checkup Recommended yearly dental visit for hygiene and checkup  Vaccinations: Influenza vaccine: up to date Pneumococcal vaccine: up to date Tdap vaccine: up to date Shingles vaccine: Due-May obtain vaccine at our office or your local pharmacy.    Covid-19: Due-May obtain vaccine at our office or your local pharmacy.   Advanced directives: no  Conditions/risks identified: see problem list   Next appointment: Follow up in one year for your annual wellness visit. 12/01/22  Preventive Care 73 Years and Older, Male Preventive care refers to lifestyle choices and visits with your health care provider that can promote health and wellness. What does preventive care include? A yearly physical exam. This is also called an annual well check. Dental exams once or twice a year. Routine eye exams. Ask your health care provider how often you should have your eyes checked. Personal lifestyle choices, including: Daily care of your teeth and gums. Regular physical activity. Eating a healthy diet. Avoiding tobacco and drug use. Limiting alcohol use. Practicing safe sex. Taking low doses of aspirin every day. Taking vitamin and mineral supplements as recommended by your health care provider. What happens during an annual well check? The services and screenings done by your health care provider during your annual well check will depend on your age, overall health, lifestyle risk factors, and family history of disease. Counseling  Your health care provider may ask you questions about your: Alcohol use. Tobacco  use. Drug use. Emotional well-being. Home and relationship well-being. Sexual activity. Eating habits. History of falls. Memory and ability to understand (cognition). Work and work Statistician. Screening  You may have the following tests or measurements: Height, weight, and BMI. Blood pressure. Lipid and cholesterol levels. These may be checked every 5 years, or more frequently if you are over 73 years old. Skin check. Lung cancer screening. You may have this screening every year starting at age 73 if you have a 30-pack-year history of smoking and currently smoke or have quit within the past 15 years. Fecal occult blood test (FOBT) of the stool. You may have this test every year starting at age 73. Flexible sigmoidoscopy or colonoscopy. You may have a sigmoidoscopy every 5 years or a colonoscopy every 10 years starting at age 73. Prostate cancer screening. Recommendations will vary depending on your family history and other risks. Hepatitis C blood test. Hepatitis B blood test. Sexually transmitted disease (STD) testing. Diabetes screening. This is done by checking your blood sugar (glucose) after you have not eaten for a while (fasting). You may have this done every 1-3 years. Abdominal aortic aneurysm (AAA) screening. You may need this if you are a current or former smoker. Osteoporosis. You may be screened starting at age 35 if you are at high risk. Talk with your health care provider about your test results, treatment options, and if necessary, the need for more tests. Vaccines  Your health care provider may recommend certain vaccines, such as: Influenza vaccine. This is recommended every year. Tetanus, diphtheria, and acellular pertussis (Tdap, Td) vaccine. You may need a Td booster every 10 years. Zoster vaccine. You may need this after age 73. Pneumococcal 13-valent conjugate (PCV13)  vaccine. One dose is recommended after age 73. Pneumococcal polysaccharide (PPSV23) vaccine.  One dose is recommended after age 73. Talk to your health care provider about which screenings and vaccines you need and how often you need them. This information is not intended to replace advice given to you by your health care provider. Make sure you discuss any questions you have with your health care provider. Document Released: 05/10/2015 Document Revised: 01/01/2016 Document Reviewed: 02/12/2015 Elsevier Interactive Patient Education  2017 [place] Prevention in the Home Falls can cause injuries. They can happen to people of all ages. There are many things you can do to make your home safe and to help prevent falls. What can I do on the outside of my home? Regularly fix the edges of walkways and driveways and fix any cracks. Remove anything that might make you trip as you walk through a door, such as a raised step or threshold. Trim any bushes or trees on the path to your home. Use bright outdoor lighting. Clear any walking paths of anything that might make someone trip, such as rocks or tools. Regularly check to see if handrails are loose or broken. Make sure that both sides of any steps have handrails. Any raised decks and porches should have guardrails on the edges. Have any leaves, snow, or ice cleared regularly. Use sand or salt on walking paths during winter. Clean up any spills in your garage right away. This includes oil or grease spills. What can I do in the bathroom? Use night lights. Install grab bars by the toilet and in the tub and shower. Do not use towel bars as grab bars. Use non-skid mats or decals in the tub or shower. If you need to sit down in the shower, use a plastic, non-slip stool. Keep the floor dry. Clean up any water that spills on the floor as soon as it happens. Remove soap buildup in the tub or shower regularly. Attach bath mats securely with double-sided non-slip rug tape. Do not have throw rugs and other things on the floor that can make  you trip. What can I do in the bedroom? Use night lights. Make sure that you have a light by your bed that is easy to reach. Do not use any sheets or blankets that are too big for your bed. They should not hang down onto the floor. Have a firm chair that has side arms. You can use this for support while you get dressed. Do not have throw rugs and other things on the floor that can make you trip. What can I do in the kitchen? Clean up any spills right away. Avoid walking on wet floors. Keep items that you use a lot in easy-to-reach places. If you need to reach something above you, use a strong step stool that has a grab bar. Keep electrical cords out of the way. Do not use floor polish or wax that makes floors slippery. If you must use wax, use non-skid floor wax. Do not have throw rugs and other things on the floor that can make you trip. What can I do with my stairs? Do not leave any items on the stairs. Make sure that there are handrails on both sides of the stairs and use them. Fix handrails that are broken or loose. Make sure that handrails are as long as the stairways. Check any carpeting to make sure that it is firmly attached to the stairs. Fix any carpet that is loose  or worn. Avoid having throw rugs at the top or bottom of the stairs. If you do have throw rugs, attach them to the floor with carpet tape. Make sure that you have a light switch at the top of the stairs and the bottom of the stairs. If you do not have them, ask someone to add them for you. What else can I do to help prevent falls? Wear shoes that: Do not have high heels. Have rubber bottoms. Are comfortable and fit you well. Are closed at the toe. Do not wear sandals. If you use a stepladder: Make sure that it is fully opened. Do not climb a closed stepladder. Make sure that both sides of the stepladder are locked into place. Ask someone to hold it for you, if possible. Clearly mark and make sure that you can  see: Any grab bars or handrails. First and last steps. Where the edge of each step is. Use tools that help you move around (mobility aids) if they are needed. These include: Canes. Walkers. Scooters. Crutches. Turn on the lights when you go into a dark area. Replace any light bulbs as soon as they burn out. Set up your furniture so you have a clear path. Avoid moving your furniture around. If any of your floors are uneven, fix them. If there are any pets around you, be aware of where they are. Review your medicines with your doctor. Some medicines can make you feel dizzy. This can increase your chance of falling. Ask your doctor what other things that you can do to help prevent falls. This information is not intended to replace advice given to you by your health care provider. Make sure you discuss any questions you have with your health care provider. Document Released: 02/07/2009 Document Revised: 09/19/2015 Document Reviewed: 05/18/2014 Elsevier Interactive Patient Education  2017 [employer].

## 2021-12-08 NOTE — Progress Notes (Signed)
Carelink Summary Report / Loop Recorder 

## 2021-12-15 ENCOUNTER — Ambulatory Visit (INDEPENDENT_AMBULATORY_CARE_PROVIDER_SITE_OTHER): Payer: Medicare Other

## 2021-12-15 DIAGNOSIS — I639 Cerebral infarction, unspecified: Secondary | ICD-10-CM

## 2021-12-17 LAB — CUP PACEART REMOTE DEVICE CHECK
Date Time Interrogation Session: 20230821090422
Implantable Pulse Generator Implant Date: 20220502

## 2021-12-18 ENCOUNTER — Other Ambulatory Visit (INDEPENDENT_AMBULATORY_CARE_PROVIDER_SITE_OTHER): Payer: Medicare Other

## 2021-12-18 DIAGNOSIS — E781 Pure hyperglyceridemia: Secondary | ICD-10-CM

## 2021-12-18 LAB — ALT: ALT: 10 U/L (ref 0–53)

## 2021-12-18 LAB — LIPID PANEL
Cholesterol: 111 mg/dL (ref 0–200)
HDL: 47.5 mg/dL (ref >39.00)
LDL Cholesterol: 36 mg/dL (ref 0–99)
NonHDL: 63.41
Total CHOL/HDL Ratio: 2
Triglycerides: 138 mg/dL (ref 0.0–149.0)
VLDL: 27.6 mg/dL (ref 0.0–40.0)

## 2021-12-18 LAB — AST: AST: 16 U/L (ref 0–37)

## 2022-01-12 LAB — CUP PACEART REMOTE DEVICE CHECK
Date Time Interrogation Session: 20230916230614
Implantable Pulse Generator Implant Date: 20220502

## 2022-01-12 NOTE — Progress Notes (Signed)
Carelink Summary Report / Loop Recorder 

## 2022-01-19 ENCOUNTER — Ambulatory Visit (INDEPENDENT_AMBULATORY_CARE_PROVIDER_SITE_OTHER): Payer: Medicare Other

## 2022-01-19 DIAGNOSIS — I639 Cerebral infarction, unspecified: Secondary | ICD-10-CM

## 2022-01-28 ENCOUNTER — Other Ambulatory Visit: Payer: Self-pay | Admitting: Internal Medicine

## 2022-01-30 ENCOUNTER — Other Ambulatory Visit: Payer: Self-pay | Admitting: Internal Medicine

## 2022-02-02 ENCOUNTER — Other Ambulatory Visit: Payer: Self-pay

## 2022-02-02 MED ORDER — ATORVASTATIN CALCIUM 20 MG PO TABS: 20.0000 mg | ORAL_TABLET | Freq: Every day | ORAL | 3 refills | Status: DC

## 2022-02-05 NOTE — Progress Notes (Signed)
Carelink Summary Report / Loop Recorder 

## 2022-02-07 ENCOUNTER — Other Ambulatory Visit: Payer: Self-pay | Admitting: Internal Medicine

## 2022-02-13 ENCOUNTER — Other Ambulatory Visit: Payer: Self-pay | Admitting: Internal Medicine

## 2022-02-23 ENCOUNTER — Ambulatory Visit (INDEPENDENT_AMBULATORY_CARE_PROVIDER_SITE_OTHER): Payer: Medicare Other

## 2022-02-23 DIAGNOSIS — I639 Cerebral infarction, unspecified: Secondary | ICD-10-CM

## 2022-02-24 LAB — CUP PACEART REMOTE DEVICE CHECK
Date Time Interrogation Session: 20231029230551
Implantable Pulse Generator Implant Date: 20220502

## 2022-03-09 ENCOUNTER — Encounter: Payer: Self-pay | Admitting: Internal Medicine

## 2022-03-09 ENCOUNTER — Ambulatory Visit (INDEPENDENT_AMBULATORY_CARE_PROVIDER_SITE_OTHER): Payer: Medicare Other | Admitting: Internal Medicine

## 2022-03-09 VITALS — BP 124/72 | HR 49 | Temp 97.3°F | Resp 18 | Ht 72.0 in | Wt 206.5 lb

## 2022-03-09 DIAGNOSIS — E785 Hyperlipidemia, unspecified: Secondary | ICD-10-CM

## 2022-03-09 DIAGNOSIS — E039 Hypothyroidism, unspecified: Secondary | ICD-10-CM | POA: Diagnosis not present

## 2022-03-09 DIAGNOSIS — F1021 Alcohol dependence, in remission: Secondary | ICD-10-CM

## 2022-03-09 DIAGNOSIS — G3184 Mild cognitive impairment, so stated: Secondary | ICD-10-CM | POA: Insufficient documentation

## 2022-03-09 DIAGNOSIS — Z09 Encounter for follow-up examination after completed treatment for conditions other than malignant neoplasm: Secondary | ICD-10-CM

## 2022-03-09 DIAGNOSIS — I4719 Other supraventricular tachycardia: Secondary | ICD-10-CM | POA: Diagnosis not present

## 2022-03-09 DIAGNOSIS — F319 Bipolar disorder, unspecified: Secondary | ICD-10-CM

## 2022-03-09 LAB — CBC WITH DIFFERENTIAL/PLATELET
Basophils Absolute: 0 10*3/uL (ref 0.0–0.1)
Basophils Relative: 0.4 % (ref 0.0–3.0)
Eosinophils Absolute: 0.2 10*3/uL (ref 0.0–0.7)
Eosinophils Relative: 2.1 % (ref 0.0–5.0)
HCT: 40.8 % (ref 39.0–52.0)
Hemoglobin: 14.2 g/dL (ref 13.0–17.0)
Lymphocytes Relative: 29 % (ref 12.0–46.0)
Lymphs Abs: 2.1 10*3/uL (ref 0.7–4.0)
MCHC: 34.9 g/dL (ref 30.0–36.0)
MCV: 98.2 fl (ref 78.0–100.0)
Monocytes Absolute: 0.5 10*3/uL (ref 0.1–1.0)
Monocytes Relative: 7.5 % (ref 3.0–12.0)
Neutro Abs: 4.4 10*3/uL (ref 1.4–7.7)
Neutrophils Relative %: 61 % (ref 43.0–77.0)
Platelets: 179 10*3/uL (ref 150.0–400.0)
RBC: 4.15 Mil/uL — ABNORMAL LOW (ref 4.22–5.81)
RDW: 14.9 % (ref 11.5–15.5)
WBC: 7.2 10*3/uL (ref 4.0–10.5)

## 2022-03-09 LAB — BASIC METABOLIC PANEL
BUN: 16 mg/dL (ref 6–23)
CO2: 26 mEq/L (ref 19–32)
Calcium: 9.2 mg/dL (ref 8.4–10.5)
Chloride: 107 mEq/L (ref 96–112)
Creatinine, Ser: 1.43 mg/dL (ref 0.40–1.50)
GFR: 48.63 mL/min — ABNORMAL LOW (ref >60.00)
Glucose, Bld: 107 mg/dL — ABNORMAL HIGH (ref 70–99)
Potassium: 4.1 mEq/L (ref 3.5–5.1)
Sodium: 141 mEq/L (ref 135–145)

## 2022-03-09 LAB — TSH: TSH: 0.92 u[IU]/mL (ref 0.35–5.50)

## 2022-03-09 MED ORDER — METOPROLOL SUCCINATE ER 100 MG PO TB24: 100.0000 mg | ORAL_TABLET | Freq: Every day | ORAL | Status: DC

## 2022-03-09 NOTE — Patient Instructions (Addendum)
Decrease metoprolol 100 mg to only 1 tablet daily Other medications the same  Check the  blood pressure regularly BP GOAL is between 110/65 and  135/85. If it is consistently higher or lower, let me know  Watch if dizziness improves with the change from metoprolol.   GO TO THE LAB : Get the blood work     [place], PLEASE SCHEDULE YOUR APPOINTMENTS Come back for a physical exam by April 2024

## 2022-03-09 NOTE — Progress Notes (Signed)
Subjective:    Patient ID: Jeffrey Acosta, male    DOB: March 29, 1951, 71 y.o.   MRN: [id number]  DOS:  03/09/2022 Type of visit - description: Routine follow-up  Chronic medical problems were reviewed. Chart was reviewed. I noticed his heart rate to be slightly low, I ask about dizziness or weakness and he admits he gets somewhat dizzy when he stands up. Sometimes times he needs to sit down to feel better. No fall or syncope. Mentally and physically he feels he is in a very good place.  No EtOH.  Review of Systems See above   Past Medical History:  Diagnosis Date   Alcoholism in remission ([place])    in AA   Arrhythmia    atrial tachycardia   Bipolar affective disorder ([place])    Depression    Diverticulosis of colon (without mention of hemorrhage)    GERD (gastroesophageal reflux disease)    Gout of multiple sites    Hiatal hernia    Hx of migraines    migraines- last one since 9-10 yrs ago   Hydrocele    HYPERLIPIDEMIA 01/10/2009   Qualifier: Diagnosis of  By: Ronnald Ramp CMA, [place]    NMR Lipoprofile 2011 LDL could not be calculated  Due to TG of 889  (811  / 750 ),  HDL 50 . Father MI @ 14 PGM CVA early 53s; PGF CVA '@72'$     Hypothyroidism    Melanoma of skin, site unspecified 11/25/2012   07/2011 abdominal  Stage 1 Dr Amy Martinique Dr Sarajane Jews and chest   Osteopenia    Polyarthralgia    Status post placement of implantable loop recorder 08/26/2020   Stroke ([place])    x 3 - last one 04/2020   Subarachnoid hemorrhage [facility])     Past Surgical History:  Procedure Laterality Date   BILIARY BRUSHING  12/07/2018   Procedure: BILIARY BRUSHING;  Surgeon: Irving Copas., MD;  Location: [facility];  Service: Gastroenterology;;   BILIARY DILATION  12/07/2018   Procedure: BILIARY DILATION;  Surgeon: Irving Copas., MD;  Location: [facility];  Service: Gastroenterology;;   BIOPSY  12/07/2018   Procedure: BIOPSY;  Surgeon: Irving Copas., MD;  Location: [facility];  Service: Gastroenterology;;   CHOLECYSTECTOMY N/A 01/29/2021   Procedure: LAPAROSCOPIC CHOLECYSTECTOMY WITH ICG DYE;  Surgeon: Rolm Bookbinder, MD;  Location: [place];  Service: General;  Laterality: N/A;   CLOSED REDUCTION FINGER WITH PERCUTANEOUS PINNING Right 04/04/2021   Procedure: CLOSED REDUCTION WITH PERCUTANEOUS PINNING RIGHT THUMB;  Surgeon: Sherilyn Cooter, MD;  Location: [place];  Service: Orthopedics;  Laterality: Right;   COLONOSCOPY  2003   negative ; Dr Sharlett Iles   ERCP N/A 12/07/2018   Procedure: ENDOSCOPIC RETROGRADE CHOLANGIOPANCREATOGRAPHY (ERCP);  Surgeon: Irving Copas., MD;  Location: [facility];  Service: Gastroenterology;  Laterality: N/A;   ESOPHAGOGASTRODUODENOSCOPY (EGD) WITH PROPOFOL N/A 12/07/2018   Procedure: ESOPHAGOGASTRODUODENOSCOPY (EGD) WITH PROPOFOL;  Surgeon: Rush Landmark Telford Nab., MD;  Location: [facility];  Service: Gastroenterology;  Laterality: N/A;   EUS N/A 12/07/2018   Procedure: UPPER ENDOSCOPIC ULTRASOUND (EUS) RADIAL;  Surgeon: Irving Copas., MD;  Location: [facility];  Service: Gastroenterology;  Laterality: N/A;   INGUINAL HERNIA REPAIR  08/07/2011   Procedure: HERNIA REPAIR INGUINAL ADULT;  Surgeon: Rolm Bookbinder, MD;  Location: [place];  Service: General;  Laterality: Right;   IR ANGIO INTRA EXTRACRAN SEL INTERNAL CAROTID BILAT MOD SED  05/14/2020   IR ANGIO VERTEBRAL SEL VERTEBRAL  BILAT MOD SED  05/14/2020   IR GUIDED DRAIN W CATHETER PLACEMENT  02/05/2021   IR RADIOLOGIST EVAL & MGMT  02/13/2021   LOOP RECORDER INSERTION  06/2020   last remote check was on 03/12/21   MELANOMA EXCISION  07/28/2011   Dr Sarajane Jews; stomach and chest   REMOVAL OF STONES  12/07/2018   Procedure: REMOVAL OF STONES;  Surgeon: Irving Copas., MD;  Location: [facility];  Service: Gastroenterology;;   TONSILLECTOMY  1951   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   UPPER GASTROINTESTINAL  ENDOSCOPY  1983   hiatal hernia   VASECTOMY  1979    Current Outpatient Medications  Medication Instructions   allopurinol (ZYLOPRIM) 300 MG tablet TAKE 1 TABLET(300 MG) BY MOUTH DAILY   atorvastatin (LIPITOR) 20 mg, Oral, Daily at bedtime   clopidogrel (PLAVIX) 75 mg, Oral, Daily   esomeprazole (NEXIUM) 40 mg, Oral, Daily before breakfast   fluticasone (FLONASE) 50 MCG/ACT nasal spray 2 sprays, Each Nare, Daily PRN   L-Methylfolate-B12-B6-B2 (CEREFOLIN) 09-25-48-5 MG TABS 1 tablet, Oral, Every morning   lamoTRIgine (LAMICTAL) 25 mg, Oral, Daily   levothyroxine (SYNTHROID) 50 mcg, Oral, Daily before breakfast   metoprolol succinate (TOPROL-XL) 100 mg, Oral, Daily, Take with or immediately following a meal   naltrexone (DEPADE) 50 mg, Oral, Daily   QUEtiapine (SEROQUEL) 800 mg, Oral, Daily at bedtime   timolol (TIMOPTIC) 0.5 % ophthalmic solution 1 drop, Both Eyes, 2 times daily   vitamin B-1 250 mg, Oral, Daily   Vitamin D 2,000 Units, Oral, Daily       Objective:   Physical Exam BP 124/72   Pulse (!) 49   Temp (!) 97.3 F (36.3 C) (Oral)   Resp 18   Ht 6' (1.829 m)   Wt 206 lb 8 oz (93.7 kg)   SpO2 94%   BMI 28.01 kg/m  General:   Well developed, NAD, BMI noted. HEENT:  Normocephalic . Face symmetric, atraumatic Lungs:  CTA B Normal respiratory effort, no intercostal retractions, no accessory muscle use. Heart: RRR,  no murmur.  Lower extremities: no pretibial edema bilaterally  Skin: Not pale. Not jaundice Neurologic:  alert & oriented X3.  Speech normal, gait appropriate for age and unassisted.  Transferring is good did not need help. Psych--  Cognition and judgment appear intact.  Cooperative with normal attention span and concentration.  Behavior appropriate. No anxious or depressed appearing.      Assessment     Assessment  Bipolar disorder-- Dr Toy Care, dc lithium 07-2014 EtOH abuse-- remission w/ occ relapse Hypothyroidism Hyperlipidemia E.D.  Atrial  Tachycardia dx 03/2019, + Zio patch,  MSK: ---Back pain: Saw Dr. Ramos/[place]~ 2002, 3 local injections, offered surgery.  ---Gout: Dr Berna Bue, released to PCP 2018 Melanoma 2014 Dr. Martinique, sees derm regularly as off 07-2021 Osteopenia  T score -2.4  ( [phone];  T score is -2.0  (08/2018). 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  07-2021, ASA changed to Plavix   PLAN H/o bipolar: Seems to be doing well EtOH: Has been dry for 200 days, praised. Hypothyroidism: Good compliance with Synthroid.  Checking labs.  Adjust medications if needed good results Hyperlipidemia: Well-controlled on atorvastatin.  Atrial tachycardia: On metoprolol, heart rate is in the low side, he feels slightly dizzy.  Rec to decrease metoprolol to 100 mg daily, monitor for symptoms, monitor BP.  Check BMP and CBC. MCI: Started Cerefolin, subjectively feels better. Preventive care: Had a flu shot and a COVID-vaccine recently. Started Shingrix RTC April 2020 for CPX

## 2022-03-09 NOTE — Assessment & Plan Note (Signed)
H/o bipolar: Seems to be doing well EtOH: Has been dry for 200 days, praised. Hypothyroidism: Good compliance with Synthroid.  Checking labs.  Adjust medications if needed good results Hyperlipidemia: Well-controlled on atorvastatin. Atrial tachycardia: On metoprolol, heart rate is in the low side, he feels slightly dizzy.  Rec to decrease metoprolol to 100 mg daily, monitor for symptoms, monitor BP.  Check BMP and CBC. MCI: Started Cerefolin, subjectively feels better. Preventive care: Had a flu shot and a COVID-vaccine recently. Started Shingrix RTC April 2020 for CPX

## 2022-03-26 ENCOUNTER — Encounter: Payer: Self-pay | Admitting: Neurology

## 2022-03-26 ENCOUNTER — Ambulatory Visit: Payer: Medicare Other | Admitting: Neurology

## 2022-03-26 VITALS — BP 173/78 | HR 67 | Ht 73.0 in | Wt 204.0 lb

## 2022-03-26 DIAGNOSIS — R413 Other amnesia: Secondary | ICD-10-CM

## 2022-03-26 DIAGNOSIS — G3184 Mild cognitive impairment, so stated: Secondary | ICD-10-CM | POA: Diagnosis not present

## 2022-03-26 MED ORDER — CEREFOLIN 6-1-50-5 MG PO TABS: 1.0000 | ORAL_TABLET | Freq: Every morning | ORAL | 1 refills | Status: DC

## 2022-03-26 NOTE — Progress Notes (Signed)
[facility] [address] [place]. [name] [address] [phone]       OFFICE FOLLOW-UP NOTE  [name] Date of Birth:  [birth year] Medical Record Number:  [id number]   HPI: [name] is a 71 year old Caucasian male seen today for initial office follow-up visit following hospital admission for subarachnoid hemorrhage in January 2022.  History is obtained from the patient and his wife is accompanying him today as well as review of electronic medical records and personally reviewed pertinent imaging films in PACS.  He has past medical history of bipolar disorder, hypothyroidism, diverticulosis, gastroesophageal reflux disease, hyperlipidemia, history of pia, atrial fibrillation and alcohol and tobacco abuse.  He was admitted on 04/30/2020 to [facility] when he was found to collapsed suddenly at home.  CT scan of the head showed moderate to large amount of acute subarachnoid hemorrhage felt to be consistent with a ruptured aneurysm with small amount of bilateral intraventricular hemorrhage.  CT angiogram was negative for aneurysm or AV malformation.  Patient was managed conservatively and was doing well until 05/11/2020 when he wandered off from the inpatient room for approximately 2 hours.  Patient later reported he had gone to smoke a cigarette but he was found unresponsive and hypothermic outside on the hospital campus.  CT scan of the head showed further regression of the bilateral extra-axial hemorrhage with resolved intraventricular blood.  The patient's family subsequently had reported that the previous day they noticed change in mental status with aphasia and inattention to conversation and decreased responsiveness.  MRI scan of the brain was obtained which showed multiple small acute infarcts in the left basal ganglia, left periatrial temporal lobe, bilateral frontoparietal regions with no associated edema or mass-effect.  EEG showed moderate diffuse  encephalopathy nonspecific in etiology without seizures.  2D echo showed ejection fraction of 55 to 60%.  SARS coronavirus test was negative.  LDL was low at 23 mg percent and hemoglobin A1c was 3.9.  Urine drug screen was negative.  Patient was found to have temperature and elevated white count and started on Zosyn and vancomycin.  He was started on nimodipine for vasospasm prophylaxis.  He underwent diagnostic cerebral catheter angiogram by Dr. Kathyrn Sheriff which was negative for any aneurysm or AV malformation.  Patient informs me that he has atrial fibrillation but was only on aspirin prior to admission.  However I do not find documentation of this in the hospital records.  He had a Zio patch as an outpatient and he has a appointment to discuss results with cardiologist Dr. Audie Box later this week.  Review of his previous physician office visits mention atrial tachycardia diagnosed in December 2020 but no definite mention of A. fib was made.  Patient states is done well since discharge to home._Outpatient physical occupational speech therapy.  His speech is mostly back to normal though still has some occasional word hesitancy and word finding difficulties.  He wants to start driving.  Is fully independent in all activities of daily living.  He has no complaints. Update 10/23/2020: He returns for follow-up after last visit 3 months ago.  Patient denies any headaches or stroke or TIA symptoms.  His main complaint today is short-term memory which seems to be getting worse.  He has trouble remembering recent information but can remember things from the past well.  He still independent and manages her own affairs.  Complains of occasional feeling of lightheadedness and dizziness but that does not last long.  He also  complains of leg aches and pains occasionally feels his balance may also be affected but this does not stay long.  He does have family history of and her grandmother had dementia and worries about it.  He  did undergo a loop recorder insertion and so for paroxysmal A. fib has not yet been found.  He is tolerating aspirin well without bruising or bleeding.  Blood pressure usually well controlled today it is slightly elevated at 150/81. Update 03/27/2021 : He returns for follow-up after last visit 6 months ago.  Patient states his short-term memory and cognitive difficulties remain about unchanged.  He had lab work done for reversible causes of memory loss on 10/23/2020 which were all normal.  EEG on 11/18/2020 showed mild to 67 Hostettler range slowing but no epileptiform activity.  MRI scan of the brain on 11/02/2020 showed no acute abnormalities and stable appearance of bifrontal hygromas and mild changes of generalized atrophy and changes of small vessel disease.  Patient had a fall 2 weeks ago when he tripped on the steps of a new house.  He developed an abrasion on his leg and dislocated his thumb.  He had a CT scan of the head done on 03/14/2021 which showed stable appearance of bifrontal small hygromas and no acute abnormality.  He has had a loop recorder inserted by cardiology and so far atrial tachycardia has been found but no A. fib.  He has no new complaints today. Update 09/23/2021 : He returns for follow-up after last visit 6 months ago.  He is accompanied by his sister.  Patient continues to have short-term memory difficulties.  He forgets phrases and words at times with later on when is not thinking about how they come back.  He feels his cognitive difficulties are about the same and not getting worse.  He scored 28/30 on the Mini-Mental today which is unchanged from last visit.  He has been participating mentally challenging activities like solving crossword puzzles.  He is currently not on any memory enhancement medications.  He remains on aspirin and Plavix both for stroke prevention for unclear reasons is tolerating it well with only minor bruising but no bleeding episodes.  Has had no recurrent stroke  or TIA symptoms.  His blood pressure is usually under good control though today it is slightly elevated in office at 164/88.  He has no new complaints. Update 03/26/2022 : He returns for follow-up after last visit 6 months ago.  He states he is doing well.  He has been taking Cerefolin NAC which seems to have helped him.  He is also participating in regular activities like solving crossword puzzles and word searches.  Remains on Plavix which is tolerating well without bruising or bleeding.  States his blood pressure is quite well controlled at home though it is elevated in my office today at 173/78.  He has no new complaints today.  He scored 29/30 on Mini-Mental status testing today which was slightly improved from last visit. ROS:   14 system review of systems is positive for fall, thumb dislocation, leg abrasion, memory loss, lightheadedness, dizziness, leg ache and pain, leg weakness altered consciousness, aphasia, weakness, confusion, word finding difficulties all other systems negative  PMH:  Past Medical History:  Diagnosis Date   Alcoholism in remission ([place])    in AA   Arrhythmia    atrial tachycardia   Bipolar affective disorder ([place])    Depression    Diverticulosis of colon (without mention of hemorrhage)  GERD (gastroesophageal reflux disease)    Gout of multiple sites    Hiatal hernia    Hx of migraines    migraines- last one since 9-10 yrs ago   Hydrocele    HYPERLIPIDEMIA 01/10/2009   Qualifier: Diagnosis of  By: Ronnald Ramp CMA, [place]    NMR Lipoprofile 2011 LDL could not be calculated  Due to TG of 889  (811  / 750 ),  HDL 50 . Father MI @ 54 PGM CVA early 89s; PGF CVA '@72'$     Hypothyroidism    Melanoma of skin, site unspecified 11/25/2012   07/2011 abdominal  Stage 1 Dr Amy Martinique Dr Sarajane Jews and chest   Osteopenia    Polyarthralgia    Status post placement of implantable loop recorder 08/26/2020   Stroke [facility])    x 3 - last one 04/2020   Subarachnoid hemorrhage ([place])      Social History:  Social History   Socioeconomic History   Marital status: Married    Spouse name: [name]   Number of children: 3   Years of education: Not on file   Highest education level: Not on file  Occupational History   Occupation: retired  Tobacco Use   Smoking status: Never   Smokeless tobacco: Never  Vaping Use   Vaping Use: Never used  Substance and Sexual Activity   Alcohol use: Not Currently    Comment: currently sober since 06/2020   Drug use: No   Sexual activity: Yes    Partners: Female    Comment: Having some issues with ED.  Would like provider's recommendation.    Other Topics Concern   Not on file  Social History Narrative   Lives with Wife, [name]   Right Handed   Drinks 4-5 cups caffeine/week   Social Determinants of Health   Financial Resource Strain: Low Risk  (11/26/2021)   Overall Financial Resource Strain (CARDIA)    Difficulty of Paying Living Expenses: Not hard at all  Food Insecurity: No Food Insecurity (11/26/2021)   Hunger Vital Sign    Worried About Running Out of Food in the Last Year: Never true    Ran Out of Food in the Last Year: Never true  Transportation Needs: No Transportation Needs (11/26/2021)   PRAPARE - Hydrologist (Medical): No    Lack of Transportation (Non-Medical): No  Physical Activity: Insufficiently Active (11/26/2021)   Exercise Vital Sign    Days of Exercise per Week: 3 days    Minutes of Exercise per Session: 40 min  Stress: No Stress Concern Present (11/26/2021)   [place]    Feeling of Stress : Not at all  Social Connections: Moderately Integrated (11/26/2021)   Social Connection and Isolation Panel [NHANES]    Frequency of Communication with Friends and Family: More than three times a week    Frequency of Social Gatherings with Friends and Family: More than three times a week    Attends Religious Services: Never    Building surveyor or Organizations: Yes    Attends Music therapist: More than 4 times per year    Marital Status: Married  Human resources officer Violence: Not At Risk (11/26/2021)   Humiliation, Afraid, Rape, and Kick questionnaire    Fear of Current or Ex-Partner: No    Emotionally Abused: No    Physically Abused: No    Sexually Abused: No    Medications:  Current Outpatient Medications on File Prior to Visit  Medication Sig Dispense Refill   allopurinol (ZYLOPRIM) 300 MG tablet TAKE 1 TABLET(300 MG) BY MOUTH DAILY 90 tablet 1   atorvastatin (LIPITOR) 20 MG tablet Take 1 tablet (20 mg total) by mouth at bedtime. 90 tablet 3   Cholecalciferol (VITAMIN D) 50 MCG (2000 UT) tablet Take 2,000 Units by mouth daily.     clopidogrel (PLAVIX) 75 MG tablet Take 1 tablet (75 mg total) by mouth daily. 90 tablet 1   esomeprazole (NEXIUM) 40 MG capsule Take 1 capsule (40 mg total) by mouth daily before breakfast. 90 capsule 1   fluticasone (FLONASE) 50 MCG/ACT nasal spray Place 2 sprays into both nostrils daily as needed for congestion.     lamoTRIgine (LAMICTAL) 25 MG tablet Take 25 mg by mouth daily.     levothyroxine (SYNTHROID) 50 MCG tablet Take 1 tablet (50 mcg total) by mouth daily before breakfast. 90 tablet 1   metoprolol succinate (TOPROL-XL) 100 MG 24 hr tablet Take 1 tablet (100 mg total) by mouth daily. Take with or immediately following a meal     naltrexone (DEPADE) 50 MG tablet Take 50 mg by mouth daily.   12   QUEtiapine (SEROQUEL) 400 MG tablet Take 800 mg by mouth at bedtime.     Thiamine HCl (VITAMIN B-1) 250 MG tablet Take 1 tablet (250 mg total) by mouth daily. 90 tablet 3   timolol (TIMOPTIC) 0.5 % ophthalmic solution Place 1 drop into both eyes 2 (two) times daily.     No current facility-administered medications on file prior to visit.    Allergies:   No Known Allergies   Physical Exam General: well developed, well nourished elderly Caucasian male, seated, in no  evident distress Head: head normocephalic and atraumatic.  Neck: supple with no carotid or supraclavicular bruits Cardiovascular: regular rate and rhythm, no murmurs Musculoskeletal: no deformity Skin:  no rash/petichiae Vascular:  Normal pulses all extremities Vitals:   03/26/22 1300  BP: (!) 173/78  Pulse: 67   Neurologic Exam Mental Status: Awake and fully alert. Oriented to place and time. Recent and remote memory slightly diminished.  Attention span, concentration and fund of knowledge appropriate. Mood and affect appropriate.  Diminished recall 2/3.  Able to name 8 animals which can walk on 4 legs.  Clock drawing 4/4.  Mini-Mental status exam score 29/30 with 1 deficit in orientation.  Clock drawing 4/4.  Able to name 15 animals which can walk on 4 legs Cranial Nerves: Fundoscopic exam reveals sharp disc margins. Pupils equal, briskly reactive to light. Extraocular movements full without nystagmus. Visual fields full to confrontation. Hearing intact. Facial sensation intact. Face, tongue, palate moves normally and symmetrically.  Motor: Normal bulk and tone. Normal strength in all tested extremity muscles. Sensory.: intact to touch ,pinprick .position and vibratory sensation.  Coordination: Rapid alternating movements normal in all extremities. Finger-to-nose and heel-to-shin performed accurately bilaterally. Gait and Station: Arises from chair without difficulty. Stance is normal. Gait demonstrates normal stride length and balance . Able to heel, toe and tandem walk with moderate difficulty.  Reflexes: 1+ and symmetric. Toes downgoing.       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  Doubt significant vasospasm contributing as it was not clearly documented on transcranial Doppler study and many of the infarcts were subcortical.  He also has  mild short-term memory and cognitive difficulties likely from mild cognitive impairment which appear stable    PLAN: I had a long discussion with the patient regarding his memory loss and mild cognitive impairment which appears to be stable.  I recommend he continue participating in cognitively challenging activities like solving crossword puzzles, playing bridge and sudoku and word searches.  He was also encouraged to continue to do memory compensation strategies.  Continue Plavix for stroke prevention and maintain aggressive risk factor modification strict control of hypertension blood pressure goal below 140/90, lipids with LDL cholesterol goal below 70 mg percent and diabetes with hemoglobin A1c goal below 6.5%.  Return for follow-up in the future in 1 year or call earlier if necessary. Greater than 50% of time during this 35-minute visit minute visit was spent on counseling,explanation of diagnosis of subarachnoid hemorrhage, memory loss and mild cognitive impairment and stroke risk, planning of further management, discussion with patient and family and coordination of care Antony Contras, MD Note: This document was prepared with digital dictation and possible smart phrase technology. Any transcriptional errors that result from this process are unintentional

## 2022-03-26 NOTE — Patient Instructions (Addendum)
I had a long discussion with the patient regarding his memory loss and mild cognitive impairment which appears to be stable.  I recommend he continue participating in cognitively challenging activities like solving crossword puzzles, playing bridge and sudoku and word searches.  He was also encouraged to continue to do memory compensation strategies.  Continue Plavix for stroke prevention and maintain aggressive risk factor modification strict control of hypertension blood pressure goal below 140/90, lipids with LDL cholesterol goal below 70 mg percent and diabetes with hemoglobin A1c goal below 6.5%.  Return for follow-up in the future in 1 year or call earlier if necessary.  Memory Compensation Strategies  Use "WARM" strategy.  W= write it down  A= associate it  R= repeat it  M= make a mental note  2.   You can keep a Social worker.  Use a 3-ring notebook with sections for the following: calendar, important names and phone numbers,  medications, doctors' names/phone numbers, lists/reminders, and a section to journal what you did  each day.   3.    Use a calendar to write appointments down.  4.    Write yourself a schedule for the day.  This can be placed on the calendar or in a separate section of the Memory Notebook.  Keeping a  regular schedule can help memory.  5.    Use medication organizer with sections for each day or morning/evening pills.  You may need help loading it  6.    Keep a basket, or pegboard by the door.  Place items that you need to take out with you in the basket or on the pegboard.  You may also want to  include a message board for reminders.  7.    Use sticky notes.  Place sticky notes with reminders in a place where the task is performed.  For example: " turn off the  stove" placed by the stove, "lock the door" placed on the door at eye level, " take your medications" on  the bathroom mirror or by the place where you normally take your medications.  8.    Use  alarms/timers.  Use while cooking to remind yourself to check on food or as a reminder to take your medicine, or as a  reminder to make a call, or as a reminder to perform another task, etc.

## 2022-03-28 NOTE — Progress Notes (Signed)
Carelink Summary Report / Loop Recorder 

## 2022-03-30 ENCOUNTER — Ambulatory Visit (INDEPENDENT_AMBULATORY_CARE_PROVIDER_SITE_OTHER): Payer: Medicare Other

## 2022-03-30 DIAGNOSIS — I639 Cerebral infarction, unspecified: Secondary | ICD-10-CM

## 2022-03-31 LAB — CUP PACEART REMOTE DEVICE CHECK
Date Time Interrogation Session: 20231203230930
Implantable Pulse Generator Implant Date: 20220502

## 2022-04-30 DIAGNOSIS — S8011XA Contusion of right lower leg, initial encounter: Secondary | ICD-10-CM | POA: Diagnosis not present

## 2022-05-04 ENCOUNTER — Ambulatory Visit (INDEPENDENT_AMBULATORY_CARE_PROVIDER_SITE_OTHER): Payer: BLUE CROSS/BLUE SHIELD

## 2022-05-04 DIAGNOSIS — I639 Cerebral infarction, unspecified: Secondary | ICD-10-CM | POA: Diagnosis not present

## 2022-05-05 ENCOUNTER — Telehealth: Payer: Self-pay

## 2022-05-05 LAB — CUP PACEART REMOTE DEVICE CHECK
Date Time Interrogation Session: 20240107230822
Implantable Pulse Generator Implant Date: 20220502

## 2022-05-05 NOTE — Telephone Encounter (Signed)
I was thinking the same thing, Jeffrey Acosta.  I forwarded the message on to Dr. Audie Box who is his primary cardiologist, since I think he is in the best position to make that call.

## 2022-05-05 NOTE — Telephone Encounter (Signed)
ILR summary report received. Battery status OK. Normal device function. No new symptom, tachy, brady, or pause episodes. 1 new AF episode 1/7 53mn in duration, ST with ectopy vs AF, mean HR 130, no OAC.  Route to triage. Monthly summary reports and ROV/PRN LA   Dr. CSallyanne Kusterreceived report and response:  Implantable loop recorder download. The device has recorded one episode identified as possible atrial fibrillation.  It occurred on May 03, 2022 and lasted for 12 minutes.  Review of the tracings suggests that there is background sinus rhythm with very frequent bursts of paroxysmal atrial tachycardia 4-7 beats long.  Cannot confidently exclude a brief period of true atrial fibrillation. Normal battery status.

## 2022-05-08 NOTE — Progress Notes (Signed)
[facility] Summary Report / Loop Recorder

## 2022-05-19 ENCOUNTER — Encounter: Payer: Self-pay | Admitting: Internal Medicine

## 2022-05-19 ENCOUNTER — Ambulatory Visit (INDEPENDENT_AMBULATORY_CARE_PROVIDER_SITE_OTHER): Payer: Medicare Other | Admitting: Internal Medicine

## 2022-05-19 VITALS — BP 134/70 | HR 58 | Temp 98.2°F | Resp 16 | Ht 73.0 in | Wt 208.0 lb

## 2022-05-19 DIAGNOSIS — S8011XA Contusion of right lower leg, initial encounter: Secondary | ICD-10-CM | POA: Diagnosis not present

## 2022-05-19 DIAGNOSIS — T148XXA Other injury of unspecified body region, initial encounter: Secondary | ICD-10-CM

## 2022-05-19 DIAGNOSIS — W19XXXA Unspecified fall, initial encounter: Secondary | ICD-10-CM | POA: Diagnosis not present

## 2022-05-19 DIAGNOSIS — Z23 Encounter for immunization: Secondary | ICD-10-CM

## 2022-05-19 MED ORDER — DOXYCYCLINE HYCLATE 100 MG PO TABS: 100.0000 mg | ORAL_TABLET | Freq: Two times a day (BID) | ORAL | 0 refills | Status: DC

## 2022-05-19 NOTE — Progress Notes (Signed)
Subjective:    Patient ID: Jeffrey Acosta, male    DOB: 07-Dec-1950, 72 y.o.   MRN: [id number]  DOS:  05/19/2022 Type of visit - description: Acute  2 weeks ago was walking his dog on his own yard, a contractor left a 30 inch deep drainage and he fell because of that. He started to bleed from the right pretibial area. Bleeding gradually stopped. The area got swollen  and was very painful. The skin has healed but he still has a big lump on the area that is tender. Overall the size of the lump has decreased in the last couple of days.  Denies fever or chills. Once the skin healed, has not seen any opening or discharge.   Review of Systems See above   Past Medical History:  Diagnosis Date   Alcoholism in remission ([place])    in AA   Arrhythmia    atrial tachycardia   Bipolar affective disorder ([place])    Depression    Diverticulosis of colon (without mention of hemorrhage)    GERD (gastroesophageal reflux disease)    Gout of multiple sites    Hiatal hernia    Hx of migraines    migraines- last one since 9-10 yrs ago   Hydrocele    HYPERLIPIDEMIA 01/10/2009   Qualifier: Diagnosis of  By: Ronnald Ramp CMA, Chemira    NMR Lipoprofile 2011 LDL could not be calculated  Due to TG of 889  (811  / 750 ),  HDL 50 . Father MI @ 85 PGM CVA early 38s; PGF CVA '@72'$     Hypothyroidism    Melanoma of skin, site unspecified 11/25/2012   07/2011 abdominal  Stage 1 Dr Amy [place] Dr Sarajane Jews and chest   Osteopenia    Polyarthralgia    Status post placement of implantable loop recorder 08/26/2020   Stroke ([place])    x 3 - last one 04/2020   Subarachnoid hemorrhage [facility])     Past Surgical History:  Procedure Laterality Date   BILIARY BRUSHING  12/07/2018   Procedure: BILIARY BRUSHING;  Surgeon: Irving Copas., MD;  Location: [facility];  Service: Gastroenterology;;   BILIARY DILATION  12/07/2018   Procedure: BILIARY DILATION;  Surgeon: Irving Copas., MD;  Location: [facility];   Service: Gastroenterology;;   BIOPSY  12/07/2018   Procedure: BIOPSY;  Surgeon: Irving Copas., MD;  Location: [facility];  Service: Gastroenterology;;   CHOLECYSTECTOMY N/A 01/29/2021   Procedure: LAPAROSCOPIC CHOLECYSTECTOMY WITH ICG DYE;  Surgeon: Rolm Bookbinder, MD;  Location: [place];  Service: General;  Laterality: N/A;   CLOSED REDUCTION FINGER WITH PERCUTANEOUS PINNING Right 04/04/2021   Procedure: CLOSED REDUCTION WITH PERCUTANEOUS PINNING RIGHT THUMB;  Surgeon: Sherilyn Cooter, MD;  Location: [place];  Service: Orthopedics;  Laterality: Right;   COLONOSCOPY  2003   negative ; Dr Sharlett Iles   ERCP N/A 12/07/2018   Procedure: ENDOSCOPIC RETROGRADE CHOLANGIOPANCREATOGRAPHY (ERCP);  Surgeon: Irving Copas., MD;  Location: [facility];  Service: Gastroenterology;  Laterality: N/A;   ESOPHAGOGASTRODUODENOSCOPY (EGD) WITH PROPOFOL N/A 12/07/2018   Procedure: ESOPHAGOGASTRODUODENOSCOPY (EGD) WITH PROPOFOL;  Surgeon: Rush Landmark Telford Nab., MD;  Location: [facility];  Service: Gastroenterology;  Laterality: N/A;   EUS N/A 12/07/2018   Procedure: UPPER ENDOSCOPIC ULTRASOUND (EUS) RADIAL;  Surgeon: Irving Copas., MD;  Location: [facility];  Service: Gastroenterology;  Laterality: N/A;   INGUINAL HERNIA REPAIR  08/07/2011   Procedure: HERNIA REPAIR INGUINAL ADULT;  Surgeon: Rolm Bookbinder, MD;  Location: [facility];  Service: General;  Laterality: Right;   IR ANGIO INTRA EXTRACRAN SEL INTERNAL CAROTID BILAT MOD SED  05/14/2020   IR ANGIO VERTEBRAL SEL VERTEBRAL BILAT MOD SED  05/14/2020   IR GUIDED DRAIN W CATHETER PLACEMENT  02/05/2021   IR RADIOLOGIST EVAL & MGMT  02/13/2021   LOOP RECORDER INSERTION  06/2020   last remote check was on 03/12/21   MELANOMA EXCISION  07/28/2011   Dr Sarajane Jews; stomach and chest   REMOVAL OF STONES  12/07/2018   Procedure: REMOVAL OF STONES;  Surgeon: Irving Copas., MD;  Location: [facility];   Service: Gastroenterology;;   TONSILLECTOMY  1951   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   UPPER GASTROINTESTINAL ENDOSCOPY  1983   hiatal hernia   VASECTOMY  1979    Current Outpatient Medications  Medication Instructions   allopurinol (ZYLOPRIM) 300 MG tablet TAKE 1 TABLET(300 MG) BY MOUTH DAILY   atorvastatin (LIPITOR) 20 mg, Oral, Daily at bedtime   clopidogrel (PLAVIX) 75 mg, Oral, Daily   esomeprazole (NEXIUM) 40 mg, Oral, Daily before breakfast   fluticasone (FLONASE) 50 MCG/ACT nasal spray 2 sprays, Each Nare, Daily PRN   L-Methylfolate-B12-B6-B2 (CEREFOLIN) 09-25-48-5 MG TABS 1 tablet, Oral, Every morning   lamoTRIgine (LAMICTAL) 25 mg, Oral, Daily   levothyroxine (SYNTHROID) 50 mcg, Oral, Daily before breakfast   metoprolol succinate (TOPROL-XL) 100 mg, Oral, Daily, Take with or immediately following a meal   naltrexone (DEPADE) 50 mg, Oral, Daily   QUEtiapine (SEROQUEL) 800 mg, Oral, Daily at bedtime   timolol (TIMOPTIC) 0.5 % ophthalmic solution 1 drop, Both Eyes, 2 times daily   vitamin B-1 250 mg, Oral, Daily   Vitamin D 2,000 Units, Oral, Daily       Objective:   Physical Exam BP 134/70   Pulse (!) 58   Temp 98.2 F (36.8 C) (Oral)   Resp 16   Ht '6\' 1"'$  (1.854 m)   Wt 208 lb (94.3 kg)   SpO2 95%   BMI 27.44 kg/m  General:   Well developed, NAD, BMI noted. HEENT:  Normocephalic . Face symmetric, atraumatic Lower extremities: Left leg normal Right leg: Has fluctuant mass at the right pretibial area, tender, minimal warmness, no openings. Calf per se is soft and nontender See picture Skin: Not pale. Not jaundice Neurologic:  alert & oriented X3.  Speech normal, gait appropriate for age and unassisted Psych--  Cognition and judgment appear intact.  Cooperative with normal attention span and concentration.  Behavior appropriate. No anxious or depressed appearing.      Assessment      Assessment  Bipolar disorder-- Dr Toy Care, dc lithium  07-2014 EtOH abuse-- remission w/ occ relapse Hypothyroidism Hyperlipidemia E.D.  Atrial Tachycardia dx 03/2019, + Zio patch,  MSK: ---Back pain: Saw Dr. Ramos/Geoffrey~ 2002, 3 local injections, offered surgery.  ---Gout: Dr Berna Bue, released to PCP 2018 Melanoma 2014 Dr. Martinique, sees derm regularly as off 07-2021 Osteopenia  T score -2.4  ( [phone];  T score is -2.0  (08/2018). 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  07-2021, ASA changed to Plavix   PLAN Hematoma: 2 weeks ago had an accident, injury to the right  pretibial area, he bled temporarily, now presents w/a fluctuant lump, hematoma is more likely than a  abscess. Plan: Td today, cover with antibiotics for 1 week, if not gradually better or if he has fever chills he will let me know.  May need drainage. Very low suspicion  for DVT, calf is soft and nontender.

## 2022-05-19 NOTE — Assessment & Plan Note (Signed)
Hematoma: 2 weeks ago had an accident, injury to the right pretibial area, he bled temporarily, now presents w/a fluctuant lump, hematoma is more likely than a  abscess. Plan: Td today, cover with antibiotics for 1 week, if not gradually better or if he has fever chills he will let me know.  May need drainage. Very low suspicion  for DVT, calf is soft and nontender.

## 2022-05-19 NOTE — Patient Instructions (Addendum)
Keep the leg elevated  Apply heating pad to the area or warm compresses  Take antibiotics for 1 week  If you are not gradually better in the next 10 days let me know.  If the area gets worse, you have fever chills, you have opening and see discharge (pus), definite let me know.  Vaccines I recommend:  Shingrix (shingles) #2 RSV vaccine

## 2022-06-08 ENCOUNTER — Ambulatory Visit: Payer: Medicare Other

## 2022-06-08 DIAGNOSIS — I639 Cerebral infarction, unspecified: Secondary | ICD-10-CM | POA: Diagnosis not present

## 2022-06-09 LAB — CUP PACEART REMOTE DEVICE CHECK
Date Time Interrogation Session: 20240209230849
Implantable Pulse Generator Implant Date: 20220502

## 2022-06-16 NOTE — Progress Notes (Signed)
Carelink Summary Report / Loop Recorder 

## 2022-07-04 IMAGING — CT CT HEAD W/O CM
3 series · 15 of 47 positions shown, 18 images · non-contrast
Comparison: 05/01/2020, 04/30/2020

CLINICAL DATA: Headache and nausea, emesis, intracranial hemorrhage
yesterday

EXAM:
CT HEAD WITHOUT CONTRAST
TECHNIQUE: Contiguous axial images were obtained from the base of the skull
through the vertex without intravenous contrast.

[Series 3: head 5.0 h30s · axial · 0.41mm/px · z∈[-108,+27]mm · 9 of 33 slices shown, 12 images]
[im 3/33  brain]
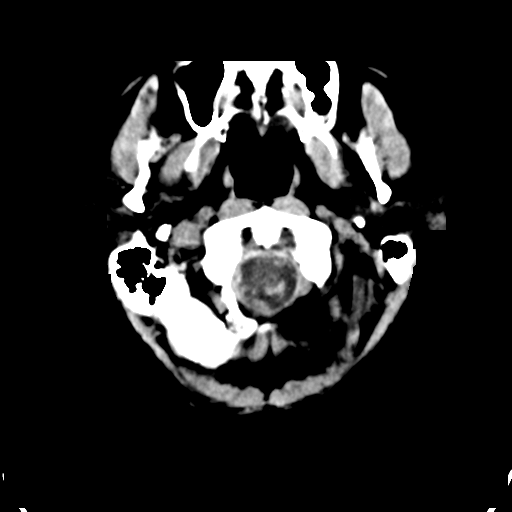
[im 3/33  bone]
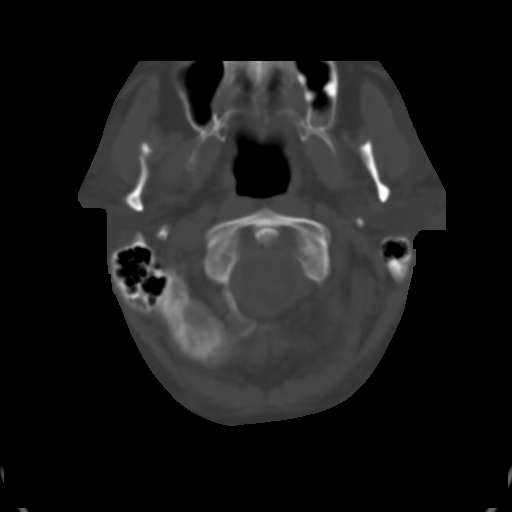
[im 6/33  brain]
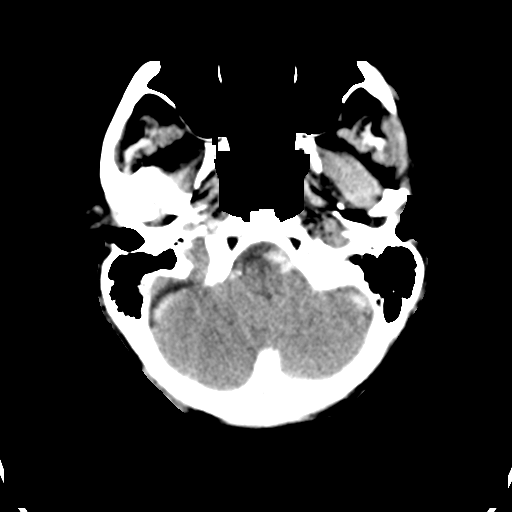
[im 9/33  brain]
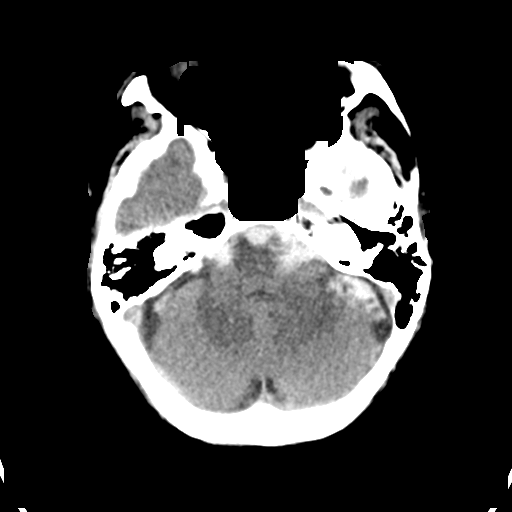
[im 13/33  brain]
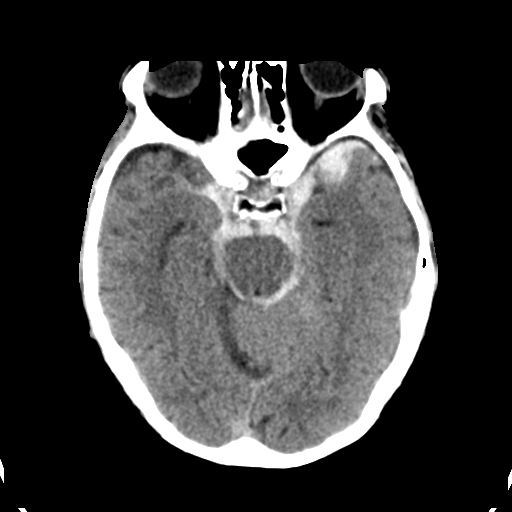
[im 17/33  brain]
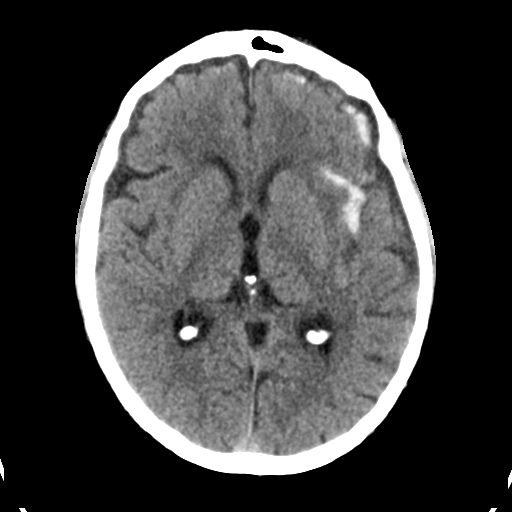
[im 17/33  bone]
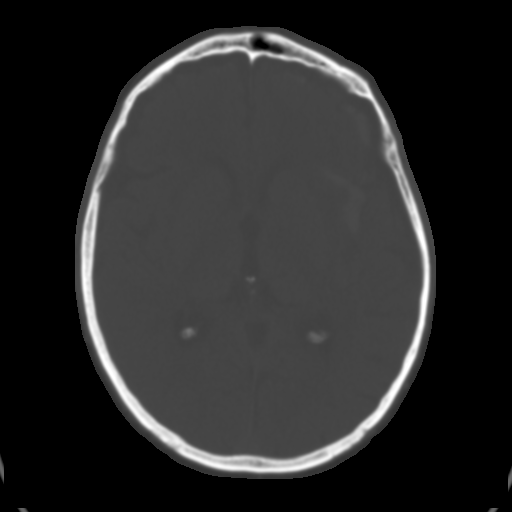
[im 20/33  brain]
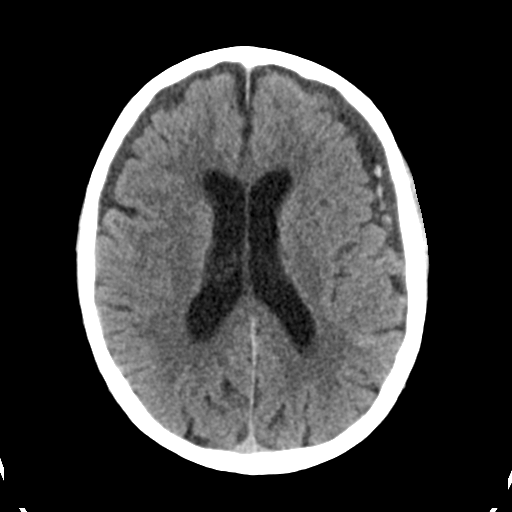
[im 24/33  brain]
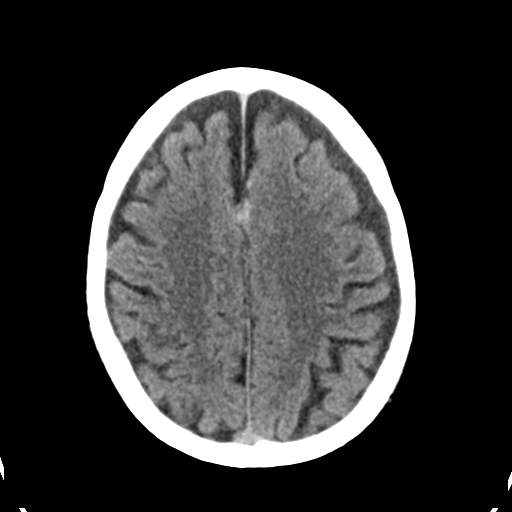
[im 27/33  brain]
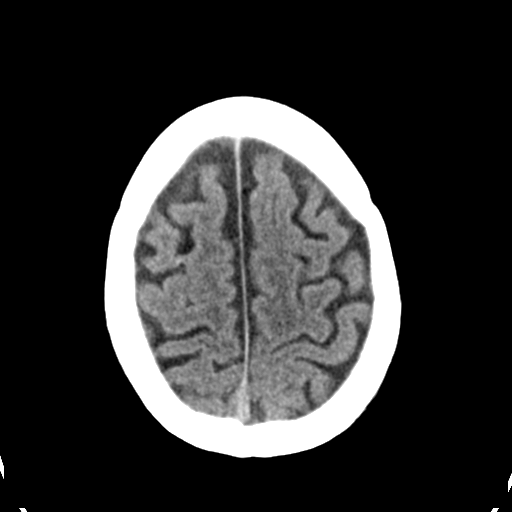
[im 30/33  brain]
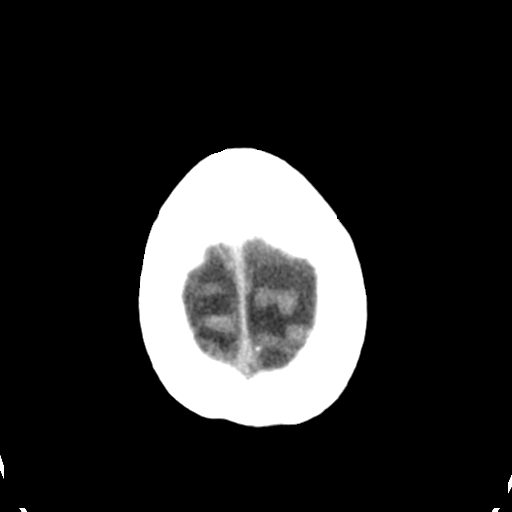
[im 30/33  bone]
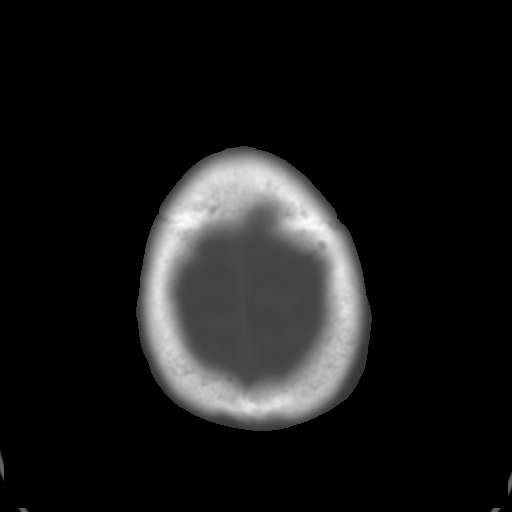

[Series 5: head 3.0 mpr cor · coronal · 0.33mm/px · 3 of 76 slices shown]
[im 26/76  brain]
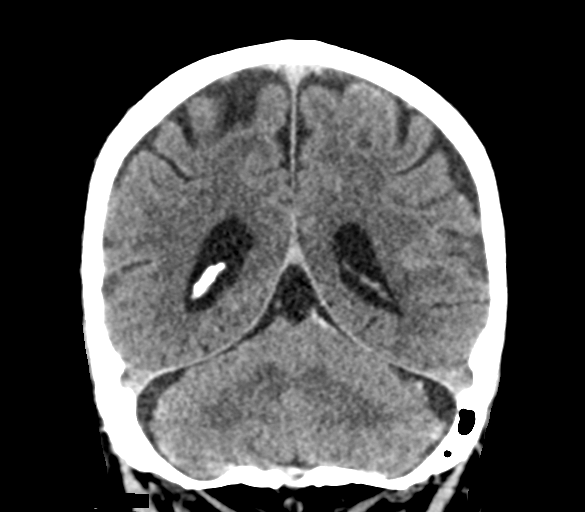
[im 34/76  brain]
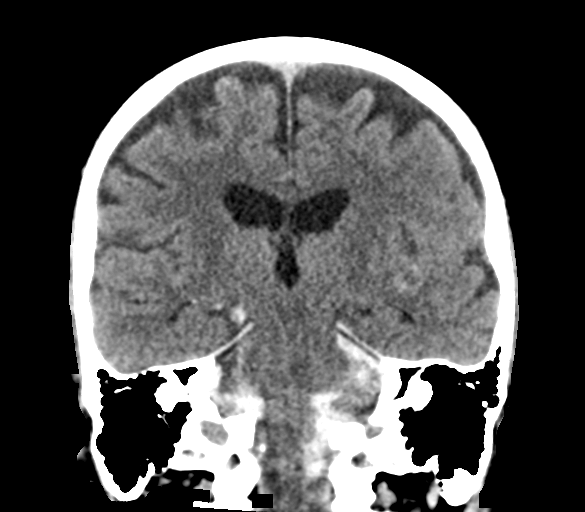
[im 42/76  brain]
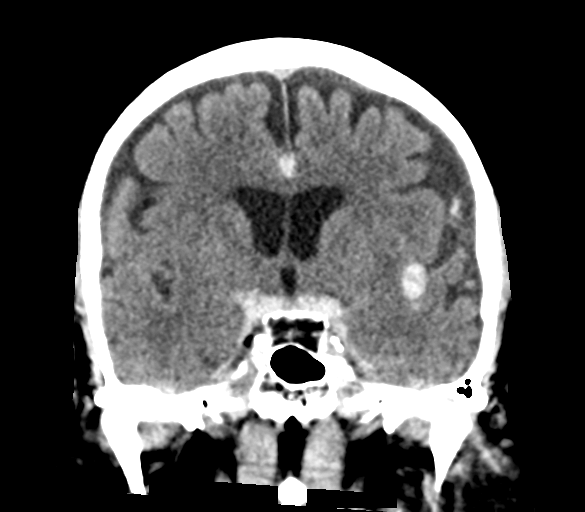

[Series 6: head 3.0 mpr sag · sagittal · 0.36mm/px · 3 of 58 slices shown]
[im 20/58  brain]
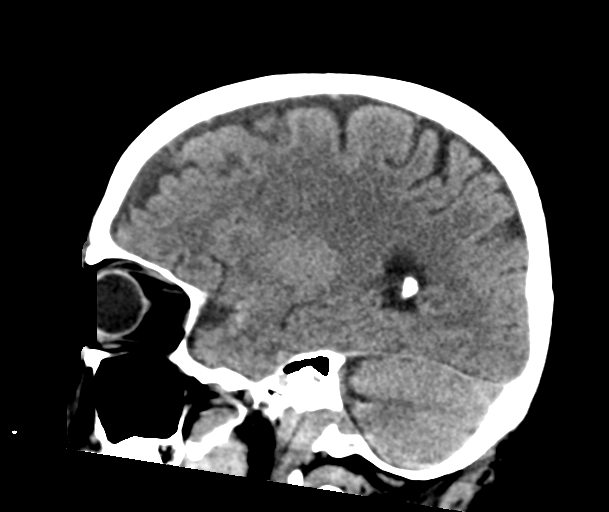
[im 29/58  brain]
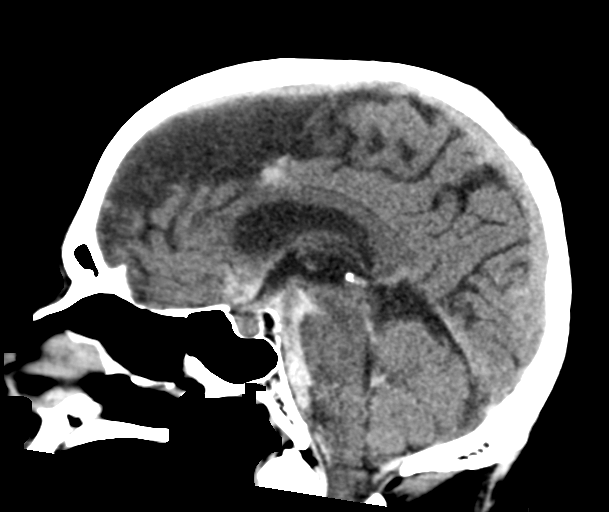
[im 39/58  brain]
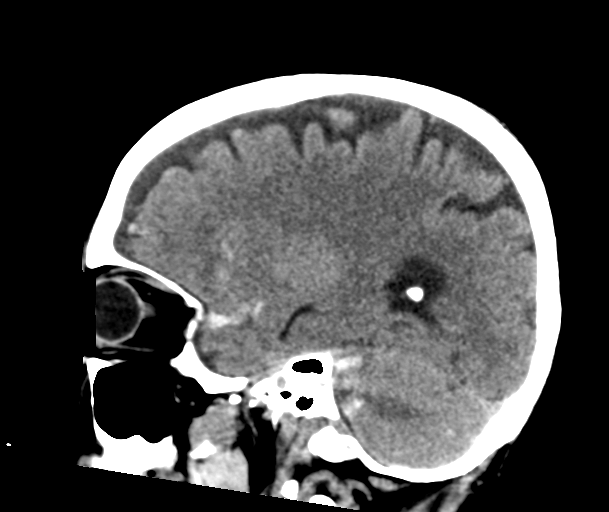

[15 of 47 positions shown; findings below may reference images not displayed]

FINDINGS: Brain: Subarachnoid hemorrhage is again noted within the posterior
fossa, basilar cisterns, suprasellar region, anterior inter
hemispheric region, and left sylvian fissure. Minimal
intraventricular hemorrhage seen within the fourth ventricle and
occipital horns of the lateral ventricles. There is a small focus of
subarachnoid hemorrhage in the left temporoparietal region image 19,
new since prior study. Otherwise no significant changes.

No evidence of acute infarct. The lateral ventricles and midline
structures are stable. No evidence of hydrocephalus. No mass effect.

Vascular: No hyperdense vessel or unexpected calcification.

Skull: Normal. Negative for fracture or focal lesion.

Sinuses/Orbits: No acute finding.

Other: None.
IMPRESSION: 1. Small focus of new subarachnoid hemorrhage within the left
temporoparietal region. Otherwise the diffuse subarachnoid
hemorrhage seen throughout the left sylvian fissure, suprasellar
region, basilar cisterns, and posterior fossa is unchanged.
2. No evidence of acute infarct.
3. No mass effect.

## 2022-07-13 ENCOUNTER — Ambulatory Visit (INDEPENDENT_AMBULATORY_CARE_PROVIDER_SITE_OTHER): Payer: Medicare Other

## 2022-07-13 DIAGNOSIS — I639 Cerebral infarction, unspecified: Secondary | ICD-10-CM

## 2022-07-15 LAB — CUP PACEART REMOTE DEVICE CHECK
Date Time Interrogation Session: 20240317231229
Implantable Pulse Generator Implant Date: 20220502

## 2022-07-18 ENCOUNTER — Other Ambulatory Visit: Payer: Self-pay | Admitting: Internal Medicine

## 2022-07-23 NOTE — Progress Notes (Signed)
Carelink Summary Report / Loop Recorder 

## 2022-08-05 ENCOUNTER — Other Ambulatory Visit: Payer: Self-pay | Admitting: Internal Medicine

## 2022-08-05 DIAGNOSIS — H524 Presbyopia: Secondary | ICD-10-CM | POA: Diagnosis not present

## 2022-08-17 ENCOUNTER — Ambulatory Visit (INDEPENDENT_AMBULATORY_CARE_PROVIDER_SITE_OTHER): Payer: Medicare Other

## 2022-08-17 DIAGNOSIS — I639 Cerebral infarction, unspecified: Secondary | ICD-10-CM

## 2022-08-18 DIAGNOSIS — I639 Cerebral infarction, unspecified: Secondary | ICD-10-CM | POA: Diagnosis not present

## 2022-08-18 LAB — CUP PACEART REMOTE DEVICE CHECK
Date Time Interrogation Session: 20240419230631
Implantable Pulse Generator Implant Date: 20220502

## 2022-08-26 NOTE — Progress Notes (Signed)
Carelink Summary Report / Loop Recorder 

## 2022-08-28 ENCOUNTER — Other Ambulatory Visit: Payer: Self-pay | Admitting: Neurology

## 2022-08-28 ENCOUNTER — Encounter: Payer: Self-pay | Admitting: Internal Medicine

## 2022-08-28 ENCOUNTER — Ambulatory Visit (INDEPENDENT_AMBULATORY_CARE_PROVIDER_SITE_OTHER): Payer: Medicare Other | Admitting: Internal Medicine

## 2022-08-28 VITALS — BP 132/80 | HR 73 | Temp 97.6°F | Resp 16 | Ht 73.0 in | Wt 207.5 lb

## 2022-08-28 DIAGNOSIS — Z0000 Encounter for general adult medical examination without abnormal findings: Secondary | ICD-10-CM | POA: Diagnosis not present

## 2022-08-28 DIAGNOSIS — E039 Hypothyroidism, unspecified: Secondary | ICD-10-CM

## 2022-08-28 DIAGNOSIS — E781 Pure hyperglyceridemia: Secondary | ICD-10-CM | POA: Diagnosis not present

## 2022-08-28 DIAGNOSIS — M109 Gout, unspecified: Secondary | ICD-10-CM | POA: Diagnosis not present

## 2022-08-28 LAB — BASIC METABOLIC PANEL
BUN: 16 mg/dL (ref 6–23)
CO2: 27 mEq/L (ref 19–32)
Calcium: 9 mg/dL (ref 8.4–10.5)
Chloride: 105 mEq/L (ref 96–112)
Creatinine, Ser: 1.44 mg/dL (ref 0.40–1.50)
GFR: 48.06 mL/min — ABNORMAL LOW (ref >60.00)
Glucose, Bld: 127 mg/dL — ABNORMAL HIGH (ref 70–99)
Potassium: 3.8 mEq/L (ref 3.5–5.1)
Sodium: 140 mEq/L (ref 135–145)

## 2022-08-28 LAB — LIPID PANEL
Cholesterol: 106 mg/dL (ref 0–200)
HDL: 44.3 mg/dL (ref >39.00)
LDL Cholesterol: 41 mg/dL (ref 0–99)
NonHDL: 61.92
Total CHOL/HDL Ratio: 2
Triglycerides: 104 mg/dL (ref 0.0–149.0)
VLDL: 20.8 mg/dL (ref 0.0–40.0)

## 2022-08-28 LAB — URIC ACID: Uric Acid, Serum: 3.7 mg/dL — ABNORMAL LOW (ref 4.0–7.8)

## 2022-08-28 LAB — TSH: TSH: 2.17 u[IU]/mL (ref 0.35–5.50)

## 2022-08-28 LAB — ALT: ALT: 10 U/L (ref 0–53)

## 2022-08-28 LAB — PSA: PSA: 0.41 ng/mL (ref 0.10–4.00)

## 2022-08-28 LAB — AST: AST: 17 U/L (ref 0–37)

## 2022-08-28 NOTE — Assessment & Plan Note (Signed)
Here for CPX, see separate documentation Bipolar: Recently saw psychiatry, stable, was advised to RTC in 1 year EtOH: Dry for over 6 months Hypothyroidism: On levothyroxine, check TSH. High cholesterol: On atorvastatin, check FLP and LFTs Gout: No recent attacks.  Previously when he tried to stop allopurinol symptoms returned.  Check a uric acid History of melanoma: Recommend to see dermatology regularly. Vitamin D deficiency: On supplements Overall feels great and is currently in a good place.   RTC 6 months

## 2022-08-28 NOTE — Assessment & Plan Note (Signed)
-   Tdap: 2014 - PNM 13: 2016; PNA shot 23: 2009, 2019 - s/p RSV per pt  - Vaccines I recommend: Tdap, Shingrix No. 2, COVID booster if not done after 12-2021,   flu shot every fall -CCS: Last cscope  2015. Some Diverticulosis , Dr Marina Goodell, 10 years  -Prostate cancer screening: No symptoms, no FH, check PSA - labs:BMP AST ALT FLP uric acid PSA TSH -Diet exercise: Reports he is doing very well, take walks twice weekly..  -Healthcare POA: Discussed.

## 2022-08-28 NOTE — Patient Instructions (Addendum)
Vaccines I recommend:  Shingrix (shingles) #2 COVID booster if not done by 12-2021 Flu shot every fall.  Check the  blood pressure regularly BP GOAL is between 110/65 and  135/85. If it is consistently higher or lower, let me know     GO TO THE LAB : Get the blood work     GO TO THE FRONT DESK, PLEASE SCHEDULE YOUR APPOINTMENTS Come back for   checkup in 6 months    "Health Care Power of attorney" ,  "Living will" (Advance care planning documents)  If you already have a living will or healthcare power of attorney, is recommended you bring the copy to be scanned in your chart.   The document will be available to all the doctors you see in the system.  Advance care planning is a process that supports adults in  understanding and sharing their preferences regarding future medical care.  The patient's preferences are recorded in documents called Advance Directives and the can be modified at any time while the patient is in full mental capacity.   If you don't have one, please consider create one.      More information at: [email or web address]

## 2022-08-28 NOTE — Progress Notes (Signed)
Subjective:    Patient ID: Jeffrey Acosta, male    DOB: [birth year], 72 y.o.   MRN: [id number]  DOS:  08/28/2022 Type of visit - description: cpx  Since the last office visit is doing well, has no major concerns. He remains active, taking a 2 mile walk twice weekly. Denies chest pain or difficulty breathing. Again nothing other than normal aches and pains; he feels very well.  Review of Systems  Other than above, a 14 point review of systems is negative     Past Medical History:  Diagnosis Date   Alcoholism in remission ([facility])    in AA   Arrhythmia    atrial tachycardia   Bipolar affective disorder ([facility])    Depression    Diverticulosis of colon (without mention of hemorrhage)    GERD (gastroesophageal reflux disease)    Gout of multiple sites    Hiatal hernia    Hx of migraines    migraines- last one since 9-10 yrs ago   Hydrocele    HYPERLIPIDEMIA 01/10/2009   Qualifier: Diagnosis of  By: Yetta Barre CMA, Chemira    NMR Lipoprofile 2011 LDL could not be calculated  Due to TG of 889  (811  / 750 ),  HDL 50 . Father MI @ 48 PGM CVA early 100s; PGF CVA @72     Hypothyroidism    Melanoma of skin, site unspecified 11/25/2012   07/2011 abdominal  Stage 1 Dr Amy Swazil[place] Dr Irene Limbo and chest   Osteopenia    Polyarthralgia    Status post placement of implantable loop recorder 08/26/2020   Stroke ([facility])    x 3 - last one 04/2020   Subarachnoid hemorrhage [facility])     Past Surgical History:  Procedure Laterality Date   BILIARY BRUSHING  12/07/2018   Procedure: BILIARY BRUSHING;  Surgeon: Lemar Lofty., MD;  Location: [facility];  Service: Gastroenterology;;   BILIARY DILATION  12/07/2018   Procedure: BILIARY DILATION;  Surgeon: Lemar Lofty., MD;  Location: [facility];  Service: Gastroenterology;;   BIOPSY  12/07/2018   Procedure: BIOPSY;  Surgeon: Lemar Lofty., MD;  Location: [facility];  Service: Gastroenterology;;   CHOLECYSTECTOMY N/A 01/29/2021    Procedure: LAPAROSCOPIC CHOLECYSTECTOMY WITH ICG DYE;  Surgeon: Emelia Loron, MD;  Location[facility];  Service: General;  Laterality: N/A;   CLOSED REDUCTION FINGER WITH PERCUTANEOUS PINNING Right 04/04/2021   Procedure: CLOSED REDUCTION WITH PERCUTANEOUS PINNING RIGHT THUMB;  Surgeon: Marlyne Beards, MD;  Location: [facility];  Service: Orthopedics;  Laterality: Right;   COLONOSCOPY  2003   negative ; Dr Jarold Motto   ERCP N/A 12/07/2018   Procedure: ENDOSCOPIC RETROGRADE CHOLANGIOPANCREATOGRAPHY (ERCP);  Surgeon: Lemar Lofty., MD;  Location: [facility];  Service: Gastroenterology;  Laterality: N/A;   ESOPHAGOGASTRODUODENOSCOPY (EGD) WITH PROPOFOL N/A 12/07/2018   Procedure: ESOPHAGOGASTRODUODENOSCOPY (EGD) WITH PROPOFOL;  Surgeon: Meridee Score Netty Starring., MD;  Location: [facility];  Service: Gastroenterology;  Laterality: N/A;   EUS N/A 12/07/2018   Procedure: UPPER ENDOSCOPIC ULTRASOUND (EUS) RADIAL;  Surgeon: Lemar Lofty., MD;  Location: [facility];  Service: Gastroenterology;  Laterality: N/A;   INGUINAL HERNIA REPAIR  08/07/2011   Procedure: HERNIA REPAIR INGUINAL ADULT;  Surgeon: Emelia Loron, MD;  Location: [facility];  Service: General;  Laterality: Right;   IR ANGIO INTRA EXTRACRAN SEL INTERNAL CAROTID BILAT MOD SED  05/14/2020   IR ANGIO VERTEBRAL SEL VERTEBRAL BILAT MOD SED  05/14/2020   IR GUIDED DRAIN W CATHETER  PLACEMENT  02/05/2021   IR RADIOLOGIST EVAL & MGMT  02/13/2021   LOOP RECORDER INSERTION  06/2020   last remote check was on 03/12/21   MELANOMA EXCISION  07/28/2011   Dr Irene Limbo; stomach and chest   REMOVAL OF STONES  12/07/2018   Procedure: REMOVAL OF STONES;  Surgeon: Meridee Score Netty Starring., MD;  Location: [facility];  Service: Gastroenterology;;   TONSILLECTOMY  1951   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   UPPER GASTROINTESTINAL ENDOSCOPY  1983   hiatal hernia   VASECTOMY  1979   Social History    Socioeconomic History   Marital status: Married    Spouse name: [name]   Number of children: 3   Years of education: Not on file   Highest education level: Not on file  Occupational History   Occupation: retired  Tobacco Use   Smoking status: Never   Smokeless tobacco: Never  Vaping Use   Vaping Use: Never used  Substance and Sexual Activity   Alcohol use: Not Currently    Comment: currently sober since 06/2020   Drug use: No   Sexual activity: Yes    Partners: Female    Comment: Having some issues with ED.  Would like provider's recommendation.    Other Topics Concern   Not on file  Social History Narrative   Lives with Wife, [name]   Right Handed   Drinks 4-5 cups caffeine/week   Social Determinants of Health   Financial Resource Strain: Low Risk  (11/26/2021)   Overall Financial Resource Strain (CARDIA)    Difficulty of Paying Living Expenses: Not hard at all  Food Insecurity: No Food Insecurity (11/26/2021)   Hunger Vital Sign    Worried About Running Out of Food in the Last Year: Never true    Ran Out of Food in the Last Year: Never true  Transportation Needs: No Transportation Needs (11/26/2021)   PRAPARE - Administrator, Civil Service (Medical): No    Lack of Transportation (Non-Medical): No  Physical Activity: Insufficiently Active (11/26/2021)   Exercise Vital Sign    Days of Exercise per Week: 3 days    Minutes of Exercise per Session: 40 min  Stress: No Stress Concern Present (11/26/2021)   [employer] of Occupational Health - Occupational Stress Questionnaire    Feeling of Stress : Not at all  Social Connections: Moderately Integrated (11/26/2021)   Social Connection and Isolation Panel [NHANES]    Frequency of Communication with Friends and Family: More than three times a week    Frequency of Social Gatherings with Friends and Family: More than three times a week    Attends Religious Services: Never    Database administrator or Organizations: Yes     Attends Engineer, structural: More than 4 times per year    Marital Status: Married  Catering manager Violence: Not At Risk (11/26/2021)   Humiliation, Afraid, Rape, and Kick questionnaire    Fear of Current or Ex-Partner: No    Emotionally Abused: No    Physically Abused: No    Sexually Abused: No    Current Outpatient Medications  Medication Instructions   allopurinol (ZYLOPRIM) 300 MG tablet TAKE 1 TABLET(300 MG) BY MOUTH DAILY   atorvastatin (LIPITOR) 20 mg, Oral, Daily at bedtime   clopidogrel (PLAVIX) 75 mg, Oral, Daily   esomeprazole (NEXIUM) 40 mg, Oral, Daily before breakfast   fluticasone (FLONASE) 50 MCG/ACT nasal spray 2 sprays, Each Nare,  Daily PRN   L-Methylfolate-B12-B6-B2 (CEREFOLIN) 09-25-48-5 MG TABS 1 tablet, Oral, Every morning   lamoTRIgine (LAMICTAL) 25 mg, Oral, Daily   levothyroxine (SYNTHROID) 50 mcg, Oral, Daily before breakfast   metoprolol succinate (TOPROL-XL) 150 mg, Oral, Daily, Take with or immediately following a meal   naltrexone (DEPADE) 50 mg, Oral, Daily   QUEtiapine (SEROQUEL) 800 mg, Oral, Daily at bedtime   timolol (TIMOPTIC) 0.5 % ophthalmic solution 1 drop, Both Eyes, 2 times daily   vitamin B-1 250 mg, Oral, Daily   Vitamin D 2,000 Units, Oral, Daily       Objective:   Physical Exam BP 132/80   Pulse 73   Temp 97.6 F (36.4 C) (Oral)   Resp 16   Ht 6\' 1"  (1.854 m)   Wt 207 lb 8 oz (94.1 kg)   SpO2 98%   BMI 27.38 kg/m  General: Well developed, NAD, BMI noted Neck: No  thyromegaly  HEENT:  Normocephalic . Face symmetric, atraumatic Lungs:  CTA B Normal respiratory effort, no intercostal retractions, no accessory muscle use. Heart: RRR,  no murmur.  Abdomen:  Not distended, soft, non-tender. No rebound or rigidity.   Lower extremities: no pretibial edema bilaterally  Skin: Exposed areas without rash. Not pale. Not jaundice Neurologic:  alert & oriented X3.  Speech normal, gait appropriate for age and  unassisted Strength symmetric and appropriate for age.  Psych: Cognition and judgment appear intact.  Cooperative with normal attention span and concentration.  Behavior appropriate. No anxious or depressed appearing.     Assessment     Assessment  Bipolar disorder-- Dr Evelene Croon, dc lithium 07-2014 EtOH abuse-- remission w/ occ relapse Hypothyroidism Hyperlipidemia E.D.  Atrial Tachycardia dx 03/2019, + Zio patch,  MSK: ---Back pain: Saw Dr. Ramos/Geoffrey~ 2002, 3 local injections, offered surgery.  ---Gout: Dr Orlin Hilding, released to PCP 2018 Melanoma 2014 Dr. [place], sees derm regularly as off 07-2021 Osteopenia  T score -2.4  ( [phone];  T score is -2.0  (08/2018).  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  07-2021, ASA changed to Plavix  PLAN Here for CPX, see separate documentation Bipolar: Recently saw psychiatry, stable, was advised to RTC in 1 year EtOH: Dry for over 6 months Hypothyroidism: On levothyroxine, check TSH. High cholesterol: On atorvastatin, check FLP and LFTs Gout: No recent attacks.  Previously when he tried to stop allopurinol symptoms returned.  Check a uric acid History of melanoma: Recommend to see dermatology regularly. Vitamin D deficiency: On supplements Overall feels great and is currently in a good place.   RTC 6 months

## 2022-09-01 ENCOUNTER — Other Ambulatory Visit: Payer: Self-pay | Admitting: Internal Medicine

## 2022-09-17 LAB — CUP PACEART REMOTE DEVICE CHECK
Date Time Interrogation Session: 20240522230649
Implantable Pulse Generator Implant Date: 20220502

## 2022-09-22 ENCOUNTER — Ambulatory Visit (INDEPENDENT_AMBULATORY_CARE_PROVIDER_SITE_OTHER): Payer: Medicare Other

## 2022-09-22 DIAGNOSIS — I639 Cerebral infarction, unspecified: Secondary | ICD-10-CM | POA: Diagnosis not present

## 2022-09-23 NOTE — Progress Notes (Signed)
Carelink Summary Report / Loop Recorder 

## 2022-10-09 DIAGNOSIS — Z6827 Body mass index (BMI) 27.0-27.9, adult: Secondary | ICD-10-CM | POA: Diagnosis not present

## 2022-10-09 DIAGNOSIS — H6121 Impacted cerumen, right ear: Secondary | ICD-10-CM | POA: Diagnosis not present

## 2022-10-16 NOTE — Progress Notes (Signed)
Carelink Summary Report / Loop Recorder 

## 2022-10-20 LAB — CUP PACEART REMOTE DEVICE CHECK
Date Time Interrogation Session: 20240624230801
Implantable Pulse Generator Implant Date: 20220502

## 2022-10-26 ENCOUNTER — Ambulatory Visit: Payer: Medicare Other

## 2022-11-02 DIAGNOSIS — K080 Exfoliation of teeth due to systemic causes: Secondary | ICD-10-CM | POA: Diagnosis not present

## 2022-11-10 DIAGNOSIS — E039 Hypothyroidism, unspecified: Secondary | ICD-10-CM | POA: Diagnosis not present

## 2022-11-10 DIAGNOSIS — I69331 Monoplegia of upper limb following cerebral infarction affecting right dominant side: Secondary | ICD-10-CM | POA: Diagnosis not present

## 2022-11-10 DIAGNOSIS — I872 Venous insufficiency (chronic) (peripheral): Secondary | ICD-10-CM | POA: Diagnosis not present

## 2022-11-10 DIAGNOSIS — I10 Essential (primary) hypertension: Secondary | ICD-10-CM | POA: Diagnosis not present

## 2022-11-10 DIAGNOSIS — K219 Gastro-esophageal reflux disease without esophagitis: Secondary | ICD-10-CM | POA: Diagnosis not present

## 2022-11-10 DIAGNOSIS — L309 Dermatitis, unspecified: Secondary | ICD-10-CM | POA: Diagnosis not present

## 2022-11-10 DIAGNOSIS — Z8249 Family history of ischemic heart disease and other diseases of the circulatory system: Secondary | ICD-10-CM | POA: Diagnosis not present

## 2022-11-10 DIAGNOSIS — E785 Hyperlipidemia, unspecified: Secondary | ICD-10-CM | POA: Diagnosis not present

## 2022-11-17 NOTE — Progress Notes (Signed)
Carelink Summary Report / Loop Recorder 

## 2022-11-23 ENCOUNTER — Ambulatory Visit (INDEPENDENT_AMBULATORY_CARE_PROVIDER_SITE_OTHER): Payer: Medicare Other

## 2022-11-23 DIAGNOSIS — I639 Cerebral infarction, unspecified: Secondary | ICD-10-CM | POA: Diagnosis not present

## 2022-11-23 LAB — CUP PACEART REMOTE DEVICE CHECK
Date Time Interrogation Session: 20240727230547
Implantable Pulse Generator Implant Date: 20220502

## 2022-11-30 ENCOUNTER — Ambulatory Visit: Payer: Medicare Other

## 2022-12-10 NOTE — Progress Notes (Signed)
Carelink Summary Report / Loop Recorder 

## 2022-12-15 DIAGNOSIS — H401131 Primary open-angle glaucoma, bilateral, mild stage: Secondary | ICD-10-CM | POA: Diagnosis not present

## 2022-12-27 LAB — CUP PACEART REMOTE DEVICE CHECK
Date Time Interrogation Session: 20240829230731
Implantable Pulse Generator Implant Date: 20220502

## 2022-12-29 ENCOUNTER — Ambulatory Visit (INDEPENDENT_AMBULATORY_CARE_PROVIDER_SITE_OTHER): Payer: Medicare Other

## 2022-12-29 DIAGNOSIS — I639 Cerebral infarction, unspecified: Secondary | ICD-10-CM

## 2023-01-04 ENCOUNTER — Ambulatory Visit: Payer: Medicare Other

## 2023-01-11 NOTE — Progress Notes (Signed)
Carelink Summary Report / Loop Recorder 

## 2023-01-14 ENCOUNTER — Other Ambulatory Visit: Payer: Self-pay | Admitting: Internal Medicine

## 2023-01-23 ENCOUNTER — Other Ambulatory Visit: Payer: Self-pay | Admitting: Internal Medicine

## 2023-01-26 ENCOUNTER — Telehealth: Payer: Self-pay | Admitting: Internal Medicine

## 2023-01-26 NOTE — Telephone Encounter (Signed)
Copied from CRM [id number]. Topic: Medicare AWV >> Jan 26, 2023 11:49 AM Payton Doughty wrote: Reason for CRM: Called LM 01/26/2023 to schedule AWV   Verlee Rossetti; Care Guide Ambulatory Clinical Support [place] [facility] Direct Dial: [phone]

## 2023-01-27 LAB — CUP PACEART REMOTE DEVICE CHECK
Date Time Interrogation Session: 20241001230424
Implantable Pulse Generator Implant Date: 20220502

## 2023-02-08 ENCOUNTER — Ambulatory Visit: Payer: Medicare Other

## 2023-02-11 ENCOUNTER — Other Ambulatory Visit: Payer: Self-pay | Admitting: Internal Medicine

## 2023-02-13 ENCOUNTER — Other Ambulatory Visit: Payer: Self-pay | Admitting: Neurology

## 2023-03-01 ENCOUNTER — Ambulatory Visit (INDEPENDENT_AMBULATORY_CARE_PROVIDER_SITE_OTHER): Payer: Medicare Other | Admitting: Internal Medicine

## 2023-03-01 ENCOUNTER — Encounter: Payer: Self-pay | Admitting: Internal Medicine

## 2023-03-01 VITALS — BP 136/70 | HR 66 | Temp 98.1°F | Resp 16 | Ht 73.0 in | Wt 212.2 lb

## 2023-03-01 DIAGNOSIS — F319 Bipolar disorder, unspecified: Secondary | ICD-10-CM

## 2023-03-01 DIAGNOSIS — E039 Hypothyroidism, unspecified: Secondary | ICD-10-CM

## 2023-03-01 DIAGNOSIS — Z85820 Personal history of malignant melanoma of skin: Secondary | ICD-10-CM

## 2023-03-01 DIAGNOSIS — F1021 Alcohol dependence, in remission: Secondary | ICD-10-CM

## 2023-03-01 DIAGNOSIS — I4719 Other supraventricular tachycardia: Secondary | ICD-10-CM | POA: Diagnosis not present

## 2023-03-01 DIAGNOSIS — M1000 Idiopathic gout, unspecified site: Secondary | ICD-10-CM | POA: Diagnosis not present

## 2023-03-01 LAB — CBC WITH DIFFERENTIAL/PLATELET
Basophils Absolute: 0 10*3/uL (ref 0.0–0.1)
Basophils Relative: 0.5 % (ref 0.0–3.0)
Eosinophils Absolute: 0.2 10*3/uL (ref 0.0–0.7)
Eosinophils Relative: 3.5 % (ref 0.0–5.0)
HCT: 40.6 % (ref 39.0–52.0)
Hemoglobin: 13.7 g/dL (ref 13.0–17.0)
Lymphocytes Relative: 21.8 % (ref 12.0–46.0)
Lymphs Abs: 1.4 10*3/uL (ref 0.7–4.0)
MCHC: 33.7 g/dL (ref 30.0–36.0)
MCV: 97.9 fL (ref 78.0–100.0)
Monocytes Absolute: 0.4 10*3/uL (ref 0.1–1.0)
Monocytes Relative: 6.9 % (ref 3.0–12.0)
Neutro Abs: 4.3 10*3/uL (ref 1.4–7.7)
Neutrophils Relative %: 67.3 % (ref 43.0–77.0)
Platelets: 180 10*3/uL (ref 150.0–400.0)
RBC: 4.15 Mil/uL — ABNORMAL LOW (ref 4.22–5.81)
RDW: 13.9 % (ref 11.5–15.5)
WBC: 6.4 10*3/uL (ref 4.0–10.5)

## 2023-03-01 LAB — BASIC METABOLIC PANEL
BUN: 13 mg/dL (ref 6–23)
CO2: 26 meq/L (ref 19–32)
Calcium: 9.6 mg/dL (ref 8.4–10.5)
Chloride: 107 meq/L (ref 96–112)
Creatinine, Ser: 1.44 mg/dL (ref 0.40–1.50)
GFR: 47.89 mL/min — ABNORMAL LOW (ref >60.00)
Glucose, Bld: 110 mg/dL — ABNORMAL HIGH (ref 70–99)
Potassium: 4.7 meq/L (ref 3.5–5.1)
Sodium: 141 meq/L (ref 135–145)

## 2023-03-01 LAB — TSH: TSH: 2.88 u[IU]/mL (ref 0.35–5.50)

## 2023-03-01 NOTE — Progress Notes (Signed)
Subjective:    Patient ID: Jeffrey Acosta, male    DOB: Aug 20, 1950, 72 y.o.   MRN: [id number]  DOS:  03/01/2023 Type of visit - description: Follow-up  Since the last visit, he is doing well.  No major concerns.  Denies chest pain or difficulty breathing.  No palpitations. History of Barrett's, no heartburn, no dysphagia.  Review of Systems See above   Past Medical History:  Diagnosis Date   Alcoholism in remission (HCC)    in AA   Arrhythmia    atrial tachycardia   Bipolar affective disorder (HCC)    Depression    Diverticulosis of colon (without mention of hemorrhage)    GERD (gastroesophageal reflux disease)    Gout of multiple sites    Hiatal hernia    Hx of migraines    migraines- last one since 9-10 yrs ago   Hydrocele    HYPERLIPIDEMIA 01/10/2009   Qualifier: Diagnosis of  By: Yetta Barre CMA, Chemira    NMR Lipoprofile 2011 LDL could not be calculated  Due to TG of 889  (811  / 750 ),  HDL 50 . Father MI @ 52 PGM CVA early 10s; PGF CVA @72     Hypothyroidism    Melanoma of skin, site unspecified 11/25/2012   07/2011 abdominal  Stage 1 Dr Amy Swazil[place] Dr Irene Limbo and chest   Osteopenia    Polyarthralgia    Status post placement of implantable loop recorder 08/26/2020   Stroke (HCC)    x 3 - last one 04/2020   Subarachnoid hemorrhage [facility])     Past Surgical History:  Procedure Laterality Date   BILIARY BRUSHING  12/07/2018   Procedure: BILIARY BRUSHING;  Surgeon: Lemar Lofty., MD;  Location: [facility];  Service: Gastroenterology;;   BILIARY DILATION  12/07/2018   Procedure: BILIARY DILATION;  Surgeon: Lemar Lofty., MD;  Location: [facility];  Service: Gastroenterology;;   BIOPSY  12/07/2018   Procedure: BIOPSY;  Surgeon: Lemar Lofty., MD;  Location: [facility];  Service: Gastroenterology;;   CHOLECYSTECTOMY N/A 01/29/2021   Procedure: LAPAROSCOPIC CHOLECYSTECTOMY WITH ICG DYE;  Surgeon: Emelia Loron, MD;  Location: [facility] OR;   Service: General;  Laterality: N/A;   CLOSED REDUCTION FINGER WITH PERCUTANEOUS PINNING Right 04/04/2021   Procedure: CLOSED REDUCTION WITH PERCUTANEOUS PINNING RIGHT THUMB;  Surgeon: Marlyne Beards, MD;  Location: [facility] OR;  Service: Orthopedics;  Laterality: Right;   COLONOSCOPY  2003   negative ; Dr Jarold Motto   ERCP N/A 12/07/2018   Procedure: ENDOSCOPIC RETROGRADE CHOLANGIOPANCREATOGRAPHY (ERCP);  Surgeon: Lemar Lofty., MD;  Location: [facility];  Service: Gastroenterology;  Laterality: N/A;   ESOPHAGOGASTRODUODENOSCOPY (EGD) WITH PROPOFOL N/A 12/07/2018   Procedure: ESOPHAGOGASTRODUODENOSCOPY (EGD) WITH PROPOFOL;  Surgeon: Meridee Score Netty Starring., MD;  Location: [facility];  Service: Gastroenterology;  Laterality: N/A;   EUS N/A 12/07/2018   Procedure: UPPER ENDOSCOPIC ULTRASOUND (EUS) RADIAL;  Surgeon: Lemar Lofty., MD;  Location: [facility];  Service: Gastroenterology;  Laterality: N/A;   INGUINAL HERNIA REPAIR  08/07/2011   Procedure: HERNIA REPAIR INGUINAL ADULT;  Surgeon: Emelia Loron, MD;  Location: [facility];  Service: General;  Laterality: Right;   IR ANGIO INTRA EXTRACRAN SEL INTERNAL CAROTID BILAT MOD SED  05/14/2020   IR ANGIO VERTEBRAL SEL VERTEBRAL BILAT MOD SED  05/14/2020   IR GUIDED DRAIN W CATHETER PLACEMENT  02/05/2021   IR RADIOLOGIST EVAL & MGMT  02/13/2021   LOOP RECORDER INSERTION  06/2020   last remote  check was on 03/12/21   MELANOMA EXCISION  07/28/2011   Dr Irene Limbo; stomach and chest   REMOVAL OF STONES  12/07/2018   Procedure: REMOVAL OF STONES;  Surgeon: Lemar Lofty., MD;  Location: [facility];  Service: Gastroenterology;;   TONSILLECTOMY  1951   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   UPPER GASTROINTESTINAL ENDOSCOPY  1983   hiatal hernia   VASECTOMY  1979    Current Outpatient Medications  Medication Instructions   allopurinol (ZYLOPRIM) 300 mg, Oral, Daily   atorvastatin  (LIPITOR) 20 mg, Oral, Daily at bedtime   clopidogrel (PLAVIX) 75 mg, Oral, Daily   esomeprazole (NEXIUM) 40 MG capsule TAKE 1 CAPSULE(40 MG) BY MOUTH DAILY BEFORE BREAKFAST   fluticasone (FLONASE) 50 MCG/ACT nasal spray 2 sprays, Each Nare, Daily PRN   L-Methylfolate-B12-B6-B2 (CEREFOLIN) 09-25-48-5 MG TABS 1 tablet, Oral, Every morning   lamoTRIgine (LAMICTAL) 25 mg, Oral, Daily   levothyroxine (SYNTHROID) 50 mcg, Oral, Daily before breakfast   metoprolol succinate (TOPROL-XL) 150 mg, Oral, Daily, Take with or immediately following a meal   naltrexone (DEPADE) 50 mg, Oral, Daily   QUEtiapine (SEROQUEL) 800 mg, Oral, Daily at bedtime   timolol (TIMOPTIC) 0.5 % ophthalmic solution 1 drop, Both Eyes, 2 times daily   vitamin B-1 250 mg, Oral, Daily   Vitamin D 2,000 Units, Oral, Daily       Objective:   Physical Exam BP 136/70   Pulse 66   Temp 98.1 F (36.7 C) (Oral)   Resp 16   Ht 6\' 1"  (1.854 m)   Wt 212 lb 4 oz (96.3 kg)   SpO2 98%   BMI 28.00 kg/m  General:   Well developed, NAD, BMI noted. HEENT:  Normocephalic . Face symmetric, atraumatic Lungs:  CTA B Normal respiratory effort, no intercostal retractions, no accessory muscle use. Heart: RRR,  no murmur.  Lower extremities: no pretibial edema bilaterally  Skin: Not pale. Not jaundice Neurologic:  alert & oriented X3.  Speech normal, gait appropriate for age and unassisted Psych--  Cognition and judgment appear intact.  Cooperative with normal attention span and concentration.  Behavior appropriate. No anxious or depressed appearing.      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  07-2021, ASA changed to Plavix Gout: Dr Orlin Hilding, released to PCP 2018 Melanoma  2014 Dr. Swaz[place]  Osteopenia  T score -2.4  ( [phone];  T score is -2.0  (08/2018).  T score -2.2 (08/2021) Vit d def dx 02-2015 GI:  -GERD with Barrett's per EGD 11-2018, EHD 09/2020 - History of diverticulitis -11-2018: Mild to moderate biliary dilation without definitive cause found status post EUS and ERCP with sphincterotomy and negative brushings LUTS  PLAN Bipolar: Sees psychiatry yearly.  Symptoms controlled. EtOH: Report has been dry for almost a year.prised! Hypothyroidism: Good med compliance, check TSH Atrial tachycardia: On metoprolol, asymptomatic, check labs. Melanoma, history of.  Has not seen dermatology in about 2 years.  Refer to Dr. Swaziland. Preventive care: Reports recent flu and COVID-vaccine. RTC 6 months CPX.

## 2023-03-01 NOTE — Patient Instructions (Addendum)
We are referring you to Dr. Swaziland, dermatology  GO TO THE LAB : Get the blood work     Next visit with me in 6 months for a physical exam Please schedule it at the front desk

## 2023-03-01 NOTE — Assessment & Plan Note (Signed)
Bipolar: Sees psychiatry yearly.  Symptoms controlled. EtOH: Report has been dry for almost a year.prised! Hypothyroidism: Good med compliance, check TSH Atrial tachycardia: On metoprolol, asymptomatic, check labs. Melanoma, history of.  Has not seen dermatology in about 2 years.  Refer to Dr. Swaziland. Preventive care: Reports recent flu and COVID-vaccine. RTC 6 months CPX.

## 2023-03-03 DIAGNOSIS — Z85820 Personal history of malignant melanoma of skin: Secondary | ICD-10-CM | POA: Diagnosis not present

## 2023-03-03 DIAGNOSIS — D2261 Melanocytic nevi of right upper limb, including shoulder: Secondary | ICD-10-CM | POA: Diagnosis not present

## 2023-03-03 DIAGNOSIS — L821 Other seborrheic keratosis: Secondary | ICD-10-CM | POA: Diagnosis not present

## 2023-03-03 DIAGNOSIS — L853 Xerosis cutis: Secondary | ICD-10-CM | POA: Diagnosis not present

## 2023-03-03 DIAGNOSIS — D225 Melanocytic nevi of trunk: Secondary | ICD-10-CM | POA: Diagnosis not present

## 2023-03-03 DIAGNOSIS — D485 Neoplasm of uncertain behavior of skin: Secondary | ICD-10-CM | POA: Diagnosis not present

## 2023-03-03 LAB — CUP PACEART REMOTE DEVICE CHECK
Date Time Interrogation Session: 20241103230319
Implantable Pulse Generator Implant Date: 20220502

## 2023-03-08 ENCOUNTER — Ambulatory Visit (INDEPENDENT_AMBULATORY_CARE_PROVIDER_SITE_OTHER): Payer: Medicare Other

## 2023-03-08 DIAGNOSIS — I639 Cerebral infarction, unspecified: Secondary | ICD-10-CM

## 2023-03-15 ENCOUNTER — Ambulatory Visit: Payer: Medicare Other

## 2023-03-16 DIAGNOSIS — K080 Exfoliation of teeth due to systemic causes: Secondary | ICD-10-CM | POA: Diagnosis not present

## 2023-03-19 ENCOUNTER — Other Ambulatory Visit: Payer: Self-pay | Admitting: Internal Medicine

## 2023-03-22 ENCOUNTER — Ambulatory Visit: Payer: Medicare Other | Admitting: Neurology

## 2023-03-22 VITALS — BP 145/87 | HR 88 | Ht 73.0 in | Wt 213.0 lb

## 2023-03-22 DIAGNOSIS — R413 Other amnesia: Secondary | ICD-10-CM

## 2023-03-22 DIAGNOSIS — Z8673 Personal history of transient ischemic attack (TIA), and cerebral infarction without residual deficits: Secondary | ICD-10-CM | POA: Diagnosis not present

## 2023-03-22 DIAGNOSIS — G3184 Mild cognitive impairment, so stated: Secondary | ICD-10-CM

## 2023-03-22 MED ORDER — ASPIRIN 81 MG PO TBEC: 81.0000 mg | DELAYED_RELEASE_TABLET | Freq: Every day | ORAL | 12 refills | Status: DC

## 2023-03-22 NOTE — Patient Instructions (Addendum)
I had a long discussion with the patient regarding his memory loss and mild cognitive impairment which appears to be stable.  I recommend he continue participating in cognitively challenging activities like solving crossword puzzles, playing bridge and sudoku and word searches.  He was also encouraged to continue to do memory compensation strategies.  Continue Plavix for stroke prevention and maintain aggressive risk factor modification strict control of hypertension blood pressure goal below 140/90, lipids with LDL cholesterol goal below 70 mg percent and diabetes with hemoglobin A1c goal below 6.5%.  Check screening carotid ultrasound, lipid profile and hemoglobin A1c.  Return for follow-up in the future in 1 year or call earlier if necessary.  Memory Compensation Strategies  Use "WARM" strategy.  W= write it down  A= associate it  R= repeat it  M= make a mental note  2.   You can keep a Glass blower/designer.  Use a 3-ring notebook with sections for the following: calendar, important names and phone numbers,  medications, doctors' names/phone numbers, lists/reminders, and a section to journal what you did  each day.   3.    Use a calendar to write appointments down.  4.    Write yourself a schedule for the day.  This can be placed on the calendar or in a separate section of the Memory Notebook.  Keeping a  regular schedule can help memory.  5.    Use medication organizer with sections for each day or morning/evening pills.  You may need help loading it  6.    Keep a basket, or pegboard by the door.  Place items that you need to take out with you in the basket or on the pegboard.  You may also want to  include a message board for reminders.  7.    Use sticky notes.  Place sticky notes with reminders in a place where the task is performed.  For example: " turn off the  stove" placed by the stove, "lock the door" placed on the door at eye level, " take your medications" on  the bathroom mirror or  by the place where you normally take your medications.  8.    Use alarms/timers.  Use while cooking to remind yourself to check on food or as a reminder to take your medicine, or as a  reminder to make a call, or as a reminder to perform another task, etc.

## 2023-03-22 NOTE — Progress Notes (Signed)
[facility] [address] [place]. [place] [address] [phone]       OFFICE FOLLOW-UP NOTE  Mr. ANDROS LENKER Date of Birth:  1950-11-15 Medical Record Number:  [id number]   HPI: Mr. Poteat is a 72 year old Caucasian male seen today for initial office follow-up visit following hospital admission for subarachnoid hemorrhage in January 2022.  History is obtained from the patient and his wife is accompanying him today as well as review of electronic medical records and personally reviewed pertinent imaging films in PACS.  He has past medical history of bipolar disorder, hypothyroidism, diverticulosis, gastroesophageal reflux disease, hyperlipidemia, history of pia, atrial fibrillation and alcohol and tobacco abuse.  He was admitted on 04/30/2020 to [facility] when he was found to collapsed suddenly at home.  CT scan of the head showed moderate to large amount of acute subarachnoid hemorrhage felt to be consistent with a ruptured aneurysm with small amount of bilateral intraventricular hemorrhage.  CT angiogram was negative for aneurysm or AV malformation.  Patient was managed conservatively and was doing well until 05/11/2020 when he wandered off from the inpatient room for approximately 2 hours.  Patient later reported he had gone to smoke a cigarette but he was found unresponsive and hypothermic outside on the hospital campus.  CT scan of the head showed further regression of the bilateral extra-axial hemorrhage with resolved intraventricular blood.  The patient's family subsequently had reported that the previous day they noticed change in mental status with aphasia and inattention to conversation and decreased responsiveness.  MRI scan of the brain was obtained which showed multiple small acute infarcts in the left basal ganglia, left periatrial temporal lobe, bilateral frontoparietal regions with no associated edema or mass-effect.  EEG showed moderate diffuse  encephalopathy nonspecific in etiology without seizures.  2D echo showed ejection fraction of 55 to 60%.  SARS coronavirus test was negative.  LDL was low at 23 mg percent and hemoglobin A1c was 3.9.  Urine drug screen was negative.  Patient was found to have temperature and elevated white count and started on Zosyn and vancomycin.  He was started on nimodipine for vasospasm prophylaxis.  He underwent diagnostic cerebral catheter angiogram by Dr. Conchita Paris which was negative for any aneurysm or AV malformation.  Patient informs me that he has atrial fibrillation but was only on aspirin prior to admission.  However I do not find documentation of this in the hospital records.  He had a Zio patch as an outpatient and he has a appointment to discuss results with cardiologist Dr. Flora Lipps later this week.  Review of his previous physician office visits mention atrial tachycardia diagnosed in December 2020 but no definite mention of A. fib was made.  Patient states is done well since discharge to home._Outpatient physical occupational speech therapy.  His speech is mostly back to normal though still has some occasional word hesitancy and word finding difficulties.  He wants to start driving.  Is fully independent in all activities of daily living.  He has no complaints. Update 10/23/2020: He returns for follow-up after last visit 3 months ago.  Patient denies any headaches or stroke or TIA symptoms.  His main complaint today is short-term memory which seems to be getting worse.  He has trouble remembering recent information but can remember things from the past well.  He still independent and manages her own affairs.  Complains of occasional feeling of lightheadedness and dizziness but that does not last long.  He also  complains of leg aches and pains occasionally feels his balance may also be affected but this does not stay long.  He does have family history of and her grandmother had dementia and worries about it.  He  did undergo a loop recorder insertion and so for paroxysmal A. fib has not yet been found.  He is tolerating aspirin well without bruising or bleeding.  Blood pressure usually well controlled today it is slightly elevated at 150/81. Update 03/27/2021 : He returns for follow-up after last visit 6 months ago.  Patient states his short-term memory and cognitive difficulties remain about unchanged.  He had lab work done for reversible causes of memory loss on 10/23/2020 which were all normal.  EEG on 11/18/2020 showed mild to 67 Hostettler range slowing but no epileptiform activity.  MRI scan of the brain on 11/02/2020 showed no acute abnormalities and stable appearance of bifrontal hygromas and mild changes of generalized atrophy and changes of small vessel disease.  Patient had a fall 2 weeks ago when he tripped on the steps of a new house.  He developed an abrasion on his leg and dislocated his thumb.  He had a CT scan of the head done on 03/14/2021 which showed stable appearance of bifrontal small hygromas and no acute abnormality.  He has had a loop recorder inserted by cardiology and so far atrial tachycardia has been found but no A. fib.  He has no new complaints today. Update 09/23/2021 : He returns for follow-up after last visit 6 months ago.  He is accompanied by his sister.  Patient continues to have short-term memory difficulties.  He forgets phrases and words at times with later on when is not thinking about how they come back.  He feels his cognitive difficulties are about the same and not getting worse.  He scored 28/30 on the Mini-Mental today which is unchanged from last visit.  He has been participating mentally challenging activities like solving crossword puzzles.  He is currently not on any memory enhancement medications.  He remains on aspirin and Plavix both for stroke prevention for unclear reasons is tolerating it well with only minor bruising but no bleeding episodes.  Has had no recurrent stroke  or TIA symptoms.  His blood pressure is usually under good control though today it is slightly elevated in office at 164/88.  He has no new complaints. Update 03/26/2022 : He returns for follow-up after last visit 6 months ago.  He states he is doing well.  He has been taking Cerefolin NAC which seems to have helped him.  He is also participating in regular activities like solving crossword puzzles and word searches.  Remains on Plavix which is tolerating well without bruising or bleeding.  States his blood pressure is quite well controlled at home though it is elevated in my office today at 173/78.  He has no new complaints today.  He scored 29/30 on Mini-Mental status testing today which was slightly improved from last visit. Update 03/22/2023 : He returns for follow-up after last visit a year ago.  He continues to do well from neurovascular standpoint.  Has had no recurrent stroke or TIA symptoms.  Loop recorder interrogation on 02/28/2023 showed no evidence of paroxysmal A-fib.  Remains on Plavix for stroke prevention and is tolerating it well without bruising or bleeding.  States his blood pressure is under good control.He is tolerating Lipitor well without side effects but has not had any recent lipid profile checked.  Last lipid  profile on 08/28/2022 had shown LDL cholesterol to be 41 mg percent.  Continues to have mild short-term memory and cognitive difficulties but he feels these are stable and unchanged.  He forgets names easily and gets confused at times.  He has difficult the understanding what people are saying at times.  He has no new complaints.  He is independent in activities of daily living.  He remains on Cerefolin.  He does occasional crossword puzzles but is not doing many mentally challenging activities.  He states his bipolar disorder is well-controlled and he does follow-up with his psychiatrist regularly. ROS:   14 system review of systems is positive for memory loss, lightheadedness,  dizziness, leg ache and pain, leg weakness altered consciousness, aphasia, weakness, confusion, word finding difficulties all other systems negative  PMH:  Past Medical History:  Diagnosis Date   Alcoholism in remission (HCC)    in AA   Arrhythmia    atrial tachycardia   Bipolar affective disorder (HCC)    Depression    Diverticulosis of colon (without mention of hemorrhage)    GERD (gastroesophageal reflux disease)    Gout of multiple sites    Hiatal hernia    Hx of migraines    migraines- last one since 9-10 yrs ago   Hydrocele    HYPERLIPIDEMIA 01/10/2009   Qualifier: Diagnosis of  By: Yetta Barre CMA, Chemira    NMR Lipoprofile 2011 LDL could not be calculated  Due to TG of 889  (811  / 750 ),  HDL 50 . Father MI @ 56 PGM CVA early 25s; PGF CVA @72     Hypothyroidism    Melanoma of skin, site unspecified 11/25/2012   07/2011 abdominal  Stage 1 Dr Amy [place] Dr Irene Limbo and chest   Osteopenia    Polyarthralgia    Status post placement of implantable loop recorder 08/26/2020   Stroke [facility])    x 3 - last one 04/2020   Subarachnoid hemorrhage [facility])     Social History:  Social History   Socioeconomic History   Marital status: Married    Spouse name: [name]   Number of children: 3   Years of education: Not on file   Highest education level: Bachelor's degree (e.g., BA, AB, BS)  Occupational History   Occupation: retired  Tobacco Use   Smoking status: Never   Smokeless tobacco: Never  Vaping Use   Vaping status: Never Used  Substance and Sexual Activity   Alcohol use: Not Currently    Comment: currently sober since 06/2020   Drug use: No   Sexual activity: Yes    Partners: Female    Comment: Having some issues with ED.  Would like provider's recommendation.    Other Topics Concern   Not on file  Social History Narrative   Lives with Wife, [name]   Right Handed   Drinks 4-5 cups caffeine/week   Social Determinants of Health   Financial Resource Strain: Low Risk  (02/26/2023)    Overall Financial Resource Strain (CARDIA)    Difficulty of Paying Living Expenses: Not very hard  Food Insecurity: No Food Insecurity (02/26/2023)   Hunger Vital Sign    Worried About Running Out of Food in the Last Year: Never true    Ran Out of Food in the Last Year: Never true  Transportation Needs: No Transportation Needs (02/26/2023)   PRAPARE - Administrator, Civil Service (Medical): No    Lack of Transportation (Non-Medical): No  Physical Activity:  Insufficiently Active (02/26/2023)   Exercise Vital Sign    Days of Exercise per Week: 2 days    Minutes of Exercise per Session: 20 min  Stress: Stress Concern Present (02/26/2023)   [employer] of Occupational Health - Occupational Stress Questionnaire    Feeling of Stress : To some extent  Social Connections: Socially Integrated (02/26/2023)   Social Connection and Isolation Panel [NHANES]    Frequency of Communication with Friends and Family: More than three times a week    Frequency of Social Gatherings with Friends and Family: Once a week    Attends Religious Services: 1 to 4 times per year    Active Member of [employer] or Organizations: Yes    Attends Engineer, structural: More than 4 times per year    Marital Status: Married  Catering manager Violence: Not At Risk (11/26/2021)   Humiliation, Afraid, Rape, and Kick questionnaire    Fear of Current or Ex-Partner: No    Emotionally Abused: No    Physically Abused: No    Sexually Abused: No    Medications:   Current Outpatient Medications on File Prior to Visit  Medication Sig Dispense Refill   allopurinol (ZYLOPRIM) 300 MG tablet Take 1 tablet (300 mg total) by mouth daily. 90 tablet 1   atorvastatin (LIPITOR) 20 MG tablet Take 1 tablet (20 mg total) by mouth at bedtime. 90 tablet 3   Cholecalciferol (VITAMIN D) 50 MCG (2000 UT) tablet Take 2,000 Units by mouth daily.     clopidogrel (PLAVIX) 75 MG tablet Take 1 tablet (75 mg total) by mouth daily. 90  tablet 1   esomeprazole (NEXIUM) 40 MG capsule TAKE 1 CAPSULE(40 MG) BY MOUTH DAILY BEFORE BREAKFAST 90 capsule 1   fluticasone (FLONASE) 50 MCG/ACT nasal spray Place 2 sprays into both nostrils daily as needed for congestion.     L-Methylfolate-B12-B6-B2 (CEREFOLIN) 09-25-48-5 MG TABS TAKE 1 TABLET BY MOUTH EVERY MORNING 90 tablet 1   lamoTRIgine (LAMICTAL) 25 MG tablet Take 25 mg by mouth daily.     levothyroxine (SYNTHROID) 50 MCG tablet Take 1 tablet (50 mcg total) by mouth daily before breakfast. 90 tablet 1   metoprolol succinate (TOPROL-XL) 100 MG 24 hr tablet Take 1.5 tablets (150 mg total) by mouth daily. Take with or immediately following a meal 135 tablet 1   naltrexone (DEPADE) 50 MG tablet Take 50 mg by mouth daily.   12   QUEtiapine (SEROQUEL) 400 MG tablet Take 800 mg by mouth at bedtime.     Thiamine HCl (VITAMIN B-1) 250 MG tablet Take 1 tablet (250 mg total) by mouth daily. 90 tablet 3   timolol (TIMOPTIC) 0.5 % ophthalmic solution Place 1 drop into both eyes 2 (two) times daily.     No current facility-administered medications on file prior to visit.    Allergies:   Allergies  Allergen Reactions   Iohexol Hives    Desc: developed hives after injection; resolved with 50 mg benadryl given to pt.  Desc: developed hives after injection; resolved with 50 mg benadryl given to pt.,  Desc: developed hives after injection; resolved with 50 mg benadryl given to pt.     Physical Exam General: well developed, well nourished elderly Caucasian male, seated, in no evident distress Head: head normocephalic and atraumatic.  Neck: supple with no carotid or supraclavicular bruits Cardiovascular: regular rate and rhythm, no murmurs Musculoskeletal: no deformity Skin:  no rash/petichiae Vascular:  Normal pulses all extremities Vitals:  03/22/23 1254  BP: (!) 145/87  Pulse: 88   Neurologic Exam Mental Status: Awake and fully alert. Oriented to place and time. Recent and remote  memory slightly diminished.  Attention span, concentration and fund of knowledge appropriate. Mood and affect appropriate.  Diminished recall 2/3.  Able to name 11 animals which can walk on 4 legs.  Clock drawing 4/4.  Mini-Mental status exam score 29/30 with 1 deficit in orientation.   Cranial Nerves: Fundoscopic exam not done. Pupils equal, briskly reactive to light. Extraocular movements full without nystagmus. Visual fields full to confrontation. Hearing intact. Facial sensation intact. Face, tongue, palate moves normally and symmetrically.  Motor: Normal bulk and tone. Normal strength in all tested extremity muscles. Sensory.: intact to touch ,pinprick .position and vibratory sensation.  Coordination: Rapid alternating movements normal in all extremities. Finger-to-nose and heel-to-shin performed accurately bilaterally. Gait and Station: Arises from chair without difficulty. Stance is normal. Gait demonstrates normal stride length and balance . Able to heel, toe and tandem walk with moderate difficulty.  Reflexes: 1+ and symmetric. Toes downgoing.       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  Doubt significant vasospasm contributing as it was not clearly documented on transcranial Doppler study and many of the infarcts were subcortical.  He also has  mild short-term memory and cognitive difficulties  likely from mild cognitive impairment which appear stable    PLAN: I had a long discussion with the patient regarding his memory loss and mild cognitive impairment which appears to be stable.  I recommend he continue participating in cognitively challenging activities like solving crossword puzzles, playing bridge and sudoku and word searches.  He was also encouraged to continue to do memory compensation strategies.  Continue Plavix for stroke prevention and maintain aggressive risk factor modification strict control of hypertension blood pressure goal below 140/90, lipids with LDL cholesterol goal below 70 mg percent and diabetes with hemoglobin A1c goal below 6.5%.  Check screening carotid ultrasound, lipid profile and hemoglobin A1c.  Return for follow-up in the future in 1 year or call earlier if necessary. Greater than 50% of time during this 40-minute visit minute visit was spent on counseling,explanation of diagnosis of subarachnoid hemorrhage, memory loss and mild cognitive impairment and stroke risk, planning of further management, discussion with patient and family and coordination of care Delia Heady, MD Note: This document was prepared with digital dictation and possible smart phrase technology. Any transcriptional errors that result from this process are unintentional

## 2023-03-23 LAB — HEMOGLOBIN A1C
Est. average glucose Bld gHb Est-mCnc: 100 mg/dL
Hgb A1c MFr Bld: 5.1 % (ref 4.8–5.6)

## 2023-03-23 LAB — LIPID PANEL
Chol/HDL Ratio: 2.6 {ratio} (ref 0.0–5.0)
Cholesterol, Total: 130 mg/dL (ref 100–199)
HDL: 50 mg/dL (ref >39)
LDL Chol Calc (NIH): 65 mg/dL (ref 0–99)
Triglycerides: 75 mg/dL (ref 0–149)
VLDL Cholesterol Cal: 15 mg/dL (ref 5–40)

## 2023-03-24 ENCOUNTER — Telehealth: Payer: Self-pay | Admitting: Neurology

## 2023-03-24 NOTE — Telephone Encounter (Signed)
Called patient to inform him per dr Pearlean Brownie "Stop Aspirin and continue Plavix alone" Pt had no questions at this time but was encouraged to call back if questions arise.

## 2023-03-24 NOTE — Telephone Encounter (Signed)
Pt is asking for a call to confirm if Dr Pearlean Brownie does in fact want him to take the 81 mg asprin or not.  Pt states he was 1st told by Dr Pearlean Brownie that he wanted him on it, then when Dr Pearlean Brownie confirmed pt is on Plavix pt was told he would not take the asprin.  Pt now states the pharmacy has informed him that the 81 mg of Asprin was called in, please call pt to confirm.

## 2023-03-28 NOTE — Progress Notes (Signed)
Kindly inform the patient that lab work for cholesterol profile and screening test for diabetes were both satisfactory

## 2023-03-30 DIAGNOSIS — H401131 Primary open-angle glaucoma, bilateral, mild stage: Secondary | ICD-10-CM | POA: Diagnosis not present

## 2023-04-05 NOTE — Progress Notes (Signed)
Carelink Summary Report / Loop Recorder 

## 2023-04-12 ENCOUNTER — Ambulatory Visit: Payer: Medicare Other

## 2023-04-12 DIAGNOSIS — I639 Cerebral infarction, unspecified: Secondary | ICD-10-CM

## 2023-04-12 DIAGNOSIS — K080 Exfoliation of teeth due to systemic causes: Secondary | ICD-10-CM | POA: Diagnosis not present

## 2023-04-12 LAB — CUP PACEART REMOTE DEVICE CHECK
Date Time Interrogation Session: 20241215230333
Implantable Pulse Generator Implant Date: 20220502

## 2023-04-14 ENCOUNTER — Ambulatory Visit (HOSPITAL_COMMUNITY)
Admission: RE | Admit: 2023-04-14 | Discharge: 2023-04-14 | Disposition: A | Discharge status: Home / Self Care | Payer: Medicare Other | Source: Ambulatory Visit | Attending: Neurology | Admitting: Neurology

## 2023-04-14 DIAGNOSIS — G3184 Mild cognitive impairment, so stated: Secondary | ICD-10-CM | POA: Diagnosis not present

## 2023-04-14 DIAGNOSIS — I2510 Atherosclerotic heart disease of native coronary artery without angina pectoris: Secondary | ICD-10-CM

## 2023-04-14 NOTE — Progress Notes (Signed)
Carotid artery duplex completed. Please see CV Procedures for preliminary results.  Shona Simpson, RVT 04/14/23 9:46 AM

## 2023-04-16 ENCOUNTER — Other Ambulatory Visit: Payer: Self-pay | Admitting: Internal Medicine

## 2023-04-18 NOTE — Progress Notes (Signed)
Currently inform the patient that carotid ultrasound study shows no significant narrowing of either carotid artery in the neck.

## 2023-04-19 ENCOUNTER — Ambulatory Visit: Payer: Medicare Other

## 2023-04-19 ENCOUNTER — Telehealth: Payer: Self-pay

## 2023-04-19 NOTE — Telephone Encounter (Signed)
I called and spoke with the patient to inform him of the results of the carotid ultrasound study. He verbalized understanding of the findings and expressed appreciation for the call. No questions asked.

## 2023-04-19 NOTE — Telephone Encounter (Signed)
-----   Message from Jeffrey Acosta sent at 04/18/2023  3:31 PM EST ----- Currently inform the patient that carotid ultrasound study shows no significant narrowing of either carotid artery in the neck.

## 2023-05-05 DIAGNOSIS — K080 Exfoliation of teeth due to systemic causes: Secondary | ICD-10-CM | POA: Diagnosis not present

## 2023-05-17 ENCOUNTER — Ambulatory Visit (INDEPENDENT_AMBULATORY_CARE_PROVIDER_SITE_OTHER): Payer: Medicare Other

## 2023-05-17 DIAGNOSIS — I639 Cerebral infarction, unspecified: Secondary | ICD-10-CM | POA: Diagnosis not present

## 2023-05-17 LAB — CUP PACEART REMOTE DEVICE CHECK
Date Time Interrogation Session: 20250119230649
Implantable Pulse Generator Implant Date: 20220502

## 2023-05-20 ENCOUNTER — Encounter: Payer: Self-pay | Admitting: Cardiovascular Disease

## 2023-05-20 NOTE — Progress Notes (Signed)
Carelink Summary Report / Loop Recorder 

## 2023-06-21 ENCOUNTER — Ambulatory Visit (INDEPENDENT_AMBULATORY_CARE_PROVIDER_SITE_OTHER): Payer: Medicare Other

## 2023-06-21 DIAGNOSIS — I639 Cerebral infarction, unspecified: Secondary | ICD-10-CM | POA: Diagnosis not present

## 2023-06-22 DIAGNOSIS — K080 Exfoliation of teeth due to systemic causes: Secondary | ICD-10-CM | POA: Diagnosis not present

## 2023-06-23 LAB — CUP PACEART REMOTE DEVICE CHECK
Date Time Interrogation Session: 20250223230621
Implantable Pulse Generator Implant Date: 20220502

## 2023-06-25 NOTE — Progress Notes (Signed)
 [facility] Summary Report / Loop Recorder

## 2023-06-29 ENCOUNTER — Encounter: Payer: Self-pay | Admitting: Cardiovascular Disease

## 2023-07-12 ENCOUNTER — Other Ambulatory Visit: Payer: Self-pay | Admitting: Internal Medicine

## 2023-07-24 ENCOUNTER — Other Ambulatory Visit: Payer: Self-pay | Admitting: Family

## 2023-07-26 ENCOUNTER — Ambulatory Visit: Payer: Medicare Other

## 2023-07-26 DIAGNOSIS — I639 Cerebral infarction, unspecified: Secondary | ICD-10-CM | POA: Diagnosis not present

## 2023-07-26 LAB — CUP PACEART REMOTE DEVICE CHECK
Date Time Interrogation Session: 20250330230641
Implantable Pulse Generator Implant Date: 20220502

## 2023-07-28 NOTE — Addendum Note (Signed)
 Addended by: Geralyn Flash D on: 07/28/2023 02:17 PM   Modules accepted: Orders

## 2023-07-28 NOTE — Progress Notes (Signed)
 [facility] Summary Report / Loop Recorder

## 2023-07-31 ENCOUNTER — Encounter: Payer: Self-pay | Admitting: Cardiovascular Disease

## 2023-08-02 ENCOUNTER — Other Ambulatory Visit: Payer: Self-pay | Admitting: Neurology

## 2023-08-09 ENCOUNTER — Other Ambulatory Visit: Payer: Self-pay | Admitting: Internal Medicine

## 2023-08-09 DIAGNOSIS — H5712 Ocular pain, left eye: Secondary | ICD-10-CM | POA: Diagnosis not present

## 2023-08-09 DIAGNOSIS — S0500XA Injury of conjunctiva and corneal abrasion without foreign body, unspecified eye, initial encounter: Secondary | ICD-10-CM | POA: Diagnosis not present

## 2023-08-09 DIAGNOSIS — S0500XS Injury of conjunctiva and corneal abrasion without foreign body, unspecified eye, sequela: Secondary | ICD-10-CM | POA: Diagnosis not present

## 2023-08-11 DIAGNOSIS — S0502XD Injury of conjunctiva and corneal abrasion without foreign body, left eye, subsequent encounter: Secondary | ICD-10-CM | POA: Diagnosis not present

## 2023-08-17 DIAGNOSIS — H16142 Punctate keratitis, left eye: Secondary | ICD-10-CM | POA: Diagnosis not present

## 2023-08-17 DIAGNOSIS — H1820 Unspecified corneal edema: Secondary | ICD-10-CM | POA: Diagnosis not present

## 2023-08-30 ENCOUNTER — Ambulatory Visit: Payer: Medicare Other

## 2023-08-30 ENCOUNTER — Encounter: Payer: Self-pay | Admitting: Internal Medicine

## 2023-08-30 ENCOUNTER — Ambulatory Visit (INDEPENDENT_AMBULATORY_CARE_PROVIDER_SITE_OTHER): Payer: Medicare Other | Admitting: Internal Medicine

## 2023-08-30 VITALS — BP 126/80 | HR 57 | Temp 97.7°F | Resp 16 | Ht 73.0 in | Wt 217.2 lb

## 2023-08-30 DIAGNOSIS — Z0000 Encounter for general adult medical examination without abnormal findings: Secondary | ICD-10-CM | POA: Diagnosis not present

## 2023-08-30 DIAGNOSIS — Z1211 Encounter for screening for malignant neoplasm of colon: Secondary | ICD-10-CM

## 2023-08-30 DIAGNOSIS — E039 Hypothyroidism, unspecified: Secondary | ICD-10-CM

## 2023-08-30 DIAGNOSIS — W19XXXA Unspecified fall, initial encounter: Secondary | ICD-10-CM | POA: Diagnosis not present

## 2023-08-30 DIAGNOSIS — M1000 Idiopathic gout, unspecified site: Secondary | ICD-10-CM | POA: Diagnosis not present

## 2023-08-30 DIAGNOSIS — I639 Cerebral infarction, unspecified: Secondary | ICD-10-CM

## 2023-08-30 DIAGNOSIS — R399 Unspecified symptoms and signs involving the genitourinary system: Secondary | ICD-10-CM | POA: Diagnosis not present

## 2023-08-30 DIAGNOSIS — E781 Pure hyperglyceridemia: Secondary | ICD-10-CM

## 2023-08-30 DIAGNOSIS — R29898 Other symptoms and signs involving the musculoskeletal system: Secondary | ICD-10-CM

## 2023-08-30 DIAGNOSIS — I10 Essential (primary) hypertension: Secondary | ICD-10-CM

## 2023-08-30 DIAGNOSIS — Z0001 Encounter for general adult medical examination with abnormal findings: Secondary | ICD-10-CM | POA: Diagnosis not present

## 2023-08-30 DIAGNOSIS — I6930 Unspecified sequelae of cerebral infarction: Secondary | ICD-10-CM

## 2023-08-30 LAB — URINALYSIS, ROUTINE W REFLEX MICROSCOPIC
Bilirubin Urine: NEGATIVE
Ketones, ur: NEGATIVE
Leukocytes,Ua: NEGATIVE
Nitrite: NEGATIVE
Specific Gravity, Urine: 1.025 (ref 1.000–1.030)
Urine Glucose: NEGATIVE
Urobilinogen, UA: 1 (ref 0.0–1.0)
pH: 6 (ref 5.0–8.0)

## 2023-08-30 LAB — TSH: TSH: 6.33 u[IU]/mL — ABNORMAL HIGH (ref 0.35–5.50)

## 2023-08-30 LAB — COMPREHENSIVE METABOLIC PANEL WITH GFR
ALT: 11 U/L (ref 0–53)
AST: 18 U/L (ref 0–37)
Albumin: 4.2 g/dL (ref 3.5–5.2)
Alkaline Phosphatase: 60 U/L (ref 39–117)
BUN: 16 mg/dL (ref 6–23)
CO2: 28 meq/L (ref 19–32)
Calcium: 9.2 mg/dL (ref 8.4–10.5)
Chloride: 104 meq/L (ref 96–112)
Creatinine, Ser: 1.5 mg/dL (ref 0.40–1.50)
GFR: 45.44 mL/min — ABNORMAL LOW (ref >60.00)
Glucose, Bld: 110 mg/dL — ABNORMAL HIGH (ref 70–99)
Potassium: 4.5 meq/L (ref 3.5–5.1)
Sodium: 140 meq/L (ref 135–145)
Total Bilirubin: 0.7 mg/dL (ref 0.2–1.2)
Total Protein: 6.1 g/dL (ref 6.0–8.3)

## 2023-08-30 LAB — CUP PACEART REMOTE DEVICE CHECK
Date Time Interrogation Session: 20250504230750
Implantable Pulse Generator Implant Date: 20220502

## 2023-08-30 LAB — PSA: PSA: 0.91 ng/mL (ref 0.10–4.00)

## 2023-08-30 NOTE — Patient Instructions (Signed)
 INSTRUCTIONS  FOR TODAY  Will arrange a referral to physical therapy, discussed within the right leg weakness  GO TO THE LAB : Get the blood work     Next office visit for a checkup in 4 months Please make an appointment before you leave today       "Health Care Power of attorney" (Also know as a  "Living will" or  Advance care planning documents)  If you already have a living will or healthcare power of attorney, is recommended you bring the copy to be scanned in your chart.   The document will be available to all the doctors you see in the system.  If you are over 70 y/o and don't have the document, please read:  Advance care planning is a process that supports adults in  understanding and sharing their preferences regarding future medical care.  The patient's preferences are recorded in documents called Advance Directives and the can be modified at any time while the patient is in full mental capacity.     More information at: [email or web address]

## 2023-08-30 NOTE — Progress Notes (Unsigned)
 Subjective:    Patient ID: Jeffrey Acosta, male    DOB: 1950-05-06, 73 y.o.   MRN: [id number]  DOS:  08/30/2023 Type of visit - description: Here for CPX  Chronic medical problems addressed. Reports a fall about 5 weeks ago while he was walking down stairs carrying a package. No LOC, no head or neck issue. He is somewhat concerned about the fall because has noted that from time to time he drags the R since the stroke.   Further eval?.  Also for the last 4 months has occasional LUTS: Occasional frequency and difficulty urinating."Start and stop".  No gross hematuria. Review of Systems  Other than above, a 14 point review of systems is negative       Past Medical History:  Diagnosis Date   Alcoholism in remission (HCC)    in AA   Arrhythmia    atrial tachycardia   Bipolar affective disorder (HCC)    Depression    Diverticulosis of colon (without mention of hemorrhage)    GERD (gastroesophageal reflux disease)    Gout of multiple sites    Hiatal hernia    Hx of migraines    migraines- last one since 9-10 yrs ago   Hydrocele    HYPERLIPIDEMIA 01/10/2009   Qualifier: Diagnosis of  By: Rochelle Chu CMA, Chemira    NMR Lipoprofile 2011 LDL could not be calculated  Due to TG of 889  (811  / 750 ),  HDL 50 . Father MI @ 11 PGM CVA early 70s; PGF CVA @72     Hypothyroidism    Melanoma of skin, site unspecified 11/25/2012   07/2011 abdominal  Stage 1 Dr Amy Swazil[place] Dr Jerilynn Montenegro and chest   Osteopenia    Polyarthralgia    Status post placement of implantable loop recorder 08/26/2020   Stroke (HCC)    x 3 - last one 04/2020   Subarachnoid hemorrhage [facility])     Past Surgical History:  Procedure Laterality Date   BILIARY BRUSHING  12/07/2018   Procedure: BILIARY BRUSHING;  Surgeon: Normie Becton., MD;  Location: [facility];  Service: Gastroenterology;;   BILIARY DILATION  12/07/2018   Procedure: BILIARY DILATION;  Surgeon: Normie Becton., MD;  Location: [facility];   Service: Gastroenterology;;   BIOPSY  12/07/2018   Procedure: BIOPSY;  Surgeon: Normie Becton., MD;  Location: [facility];  Service: Gastroenterology;;   CHOLECYSTECTOMY N/A 01/29/2021   Procedure: LAPAROSCOPIC CHOLECYSTECTOMY WITH ICG DYE;  Surgeon: Enid Harry, MD;  Location: [facility] OR;  Service: General;  Laterality: N/A;   CLOSED REDUCTION FINGER WITH PERCUTANEOUS PINNING Right 04/04/2021   Procedure: CLOSED REDUCTION WITH PERCUTANEOUS PINNING RIGHT THUMB;  Surgeon: Marilyn Shropshire, MD;  Location: [facility] OR;  Service: Orthopedics;  Laterality: Right;   COLONOSCOPY  2003   negative ; Dr Adan Holms   ERCP N/A 12/07/2018   Procedure: ENDOSCOPIC RETROGRADE CHOLANGIOPANCREATOGRAPHY (ERCP);  Surgeon: Normie Becton., MD;  Location: [facility];  Service: Gastroenterology;  Laterality: N/A;   ESOPHAGOGASTRODUODENOSCOPY (EGD) WITH PROPOFOL  N/A 12/07/2018   Procedure: ESOPHAGOGASTRODUODENOSCOPY (EGD) WITH PROPOFOL ;  Surgeon: Brice Campi Albino Alu., MD;  Location: [facility];  Service: Gastroenterology;  Laterality: N/A;   EUS N/A 12/07/2018   Procedure: UPPER ENDOSCOPIC ULTRASOUND (EUS) RADIAL;  Surgeon: Normie Becton., MD;  Location: [facility];  Service: Gastroenterology;  Laterality: N/A;   INGUINAL HERNIA REPAIR  08/07/2011   Procedure: HERNIA REPAIR INGUINAL ADULT;  Surgeon: Enid Harry, MD;  Location: [facility];  Service:  General;  Laterality: Right;   IR ANGIO INTRA EXTRACRAN SEL INTERNAL CAROTID BILAT MOD SED  05/14/2020   IR ANGIO VERTEBRAL SEL VERTEBRAL BILAT MOD SED  05/14/2020   IR GUIDED DRAIN W CATHETER PLACEMENT  02/05/2021   IR RADIOLOGIST EVAL & MGMT  02/13/2021   LOOP RECORDER INSERTION  06/2020   last remote check was on 03/12/21   MELANOMA EXCISION  07/28/2011   Dr Jerilynn Montenegro; stomach and chest   REMOVAL OF STONES  12/07/2018   Procedure: REMOVAL OF STONES;  Surgeon: Mansouraty, Albino Alu., MD;  Location: [facility];   Service: Gastroenterology;;   TONSILLECTOMY  1951   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   UPPER GASTROINTESTINAL ENDOSCOPY  1983   hiatal hernia   VASECTOMY  1979   Social History   Socioeconomic History   Marital status: Married    Spouse name: [name]   Number of children: 3   Years of education: Not on file   Highest education level: Bachelor's degree (e.g., BA, AB, BS)  Occupational History   Occupation: retired  Tobacco Use   Smoking status: Never   Smokeless tobacco: Never  Vaping Use   Vaping status: Never Used  Substance and Sexual Activity   Alcohol use: Not Currently    Comment: sober   Drug use: No   Sexual activity: Yes    Partners: Female    Comment: Having some issues with ED.  Would like provider's recommendation.    Other Topics Concern   Not on file  Social History Narrative   Lives with Wife, [name]   Right Handed   Drinks 4-5 cups caffeine/week   Social Drivers of Health   Financial Resource Strain: Low Risk  (02/26/2023)   Overall Financial Resource Strain (CARDIA)    Difficulty of Paying Living Expenses: Not very hard  Food Insecurity: No Food Insecurity (02/26/2023)   Hunger Vital Sign    Worried About Running Out of Food in the Last Year: Never true    Ran Out of Food in the Last Year: Never true  Transportation Needs: No Transportation Needs (02/26/2023)   PRAPARE - Administrator, Civil Service (Medical): No    Lack of Transportation (Non-Medical): No  Physical Activity: Insufficiently Active (02/26/2023)   Exercise Vital Sign    Days of Exercise per Week: 2 days    Minutes of Exercise per Session: 20 min  Stress: Stress Concern Present (02/26/2023)   [employer] of Occupational Health - Occupational Stress Questionnaire    Feeling of Stress : To some extent  Social Connections: Socially Integrated (02/26/2023)   Social Connection and Isolation Panel [NHANES]    Frequency of Communication with Friends and Family: More  than three times a week    Frequency of Social Gatherings with Friends and Family: Once a week    Attends Religious Services: 1 to 4 times per year    Active Member of [employer] or Organizations: Yes    Attends Engineer, structural: More than 4 times per year    Marital Status: Married  Catering manager Violence: Not At Risk (11/26/2021)   Humiliation, Afraid, Rape, and Kick questionnaire    Fear of Current or Ex-Partner: No    Emotionally Abused: No    Physically Abused: No    Sexually Abused: No     Current Outpatient Medications  Medication Instructions   allopurinol  (ZYLOPRIM ) 300 mg, Oral, Daily   atorvastatin  (LIPITOR) 20 mg, Oral, Daily  at bedtime   clopidogrel  (PLAVIX ) 75 mg, Oral, Daily   esomeprazole  (NEXIUM ) 40 MG capsule TAKE 1 CAPSULE(40 MG) BY MOUTH DAILY BEFORE BREAKFAST   fluticasone  (FLONASE ) 50 MCG/ACT nasal spray 2 sprays, Daily PRN   L-Methylfolate-B12-B6-B2 (CEREFOLIN) 09-25-48-5 MG TABS 1 tablet, Oral, Every morning   lamoTRIgine  (LAMICTAL ) 25 mg, Daily   levothyroxine  (SYNTHROID ) 50 mcg, Oral, Daily before breakfast   metoprolol  succinate (TOPROL -XL) 150 mg, Oral, Daily, Take with or immediately following a meal   naltrexone (DEPADE) 50 mg, Daily   QUEtiapine  (SEROQUEL ) 800 mg, Daily at bedtime   timolol (TIMOPTIC) 0.5 % ophthalmic solution 1 drop, 2 times daily   vitamin B-1 250 mg, Oral, Daily   Vitamin D  2,000 Units, Daily       Objective:   Physical Exam BP 126/80   Pulse (!) 57   Temp 97.7 F (36.5 C) (Oral)   Resp 16   Ht 6\' 1"  (1.854 m)   Wt 217 lb 4 oz (98.5 kg)   SpO2 97%   BMI 28.66 kg/m  General: Well developed, NAD, BMI noted Neck: No  thyromegaly  HEENT:  Normocephalic . Face symmetric, atraumatic Lungs:  CTA B Normal respiratory effort, no intercostal retractions, no accessory muscle use. Heart: RRR,  no murmur.  Abdomen:  Not distended, soft, non-tender. No rebound or rigidity.   Lower extremities: no pretibial edema  bilaterally DRE: Brown stool, normal sphincter tone, prostate symmetric, nontender, nonnodular Skin: Exposed areas without rash. Not pale. Not jaundice Neurologic:  alert & oriented X3.  Speech normal, gait appropriate for age and unassisted Strength: Very subtle, very mild distal weakness R leg. Psych: Cognition and judgment appear intact.  Cooperative with normal attention span and concentration.  Behavior appropriate. No anxious or depressed appearing.     Assessment     Assessment  Bipolar disorder-- Dr Deborra Falter, dc lithium  [phone] EtOH abuse-- remission w/ occ relapse Hypothyroidism Hyperlipidemia CV: Atrial Tachycardia dx 03/2019, + Zio patch,  Neuro: - SAH traumatic and acute cryptogenic stroke 04-2020 -[facility] Dx 03/2021 at neurology --TIA?  07-2021, ASA changed to Plavix  Gout: Dr Dannie Duval, released to PCP 2018 Melanoma 2014 Dr. Swazil[place]  Osteopenia  T score -2.4  ( [phone];  T score is -2.0  (08/2018).  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  PMN 20- 2024 - s/p RSV per pt; s/p shingrix   - Vaccines I recommend , flu shot every fall.COVID booster if not done after 12-2022. -CCS: Last cscope 7/ 2015. Some Diverticulosis , Dr Elvin Hammer.  Due for a repeat  colonoscopy, likes to proceed, refer to GI -Prostate cancer screening: See comments under LUTS - labs:CMP TSH UA urine culture PSA uric acid -Diet exercise: Reports he is doing very well, take walks twice weekly..  We also addressed the following Gout: On allopurinol , checking labs.    EtOH: Continue to be sober. Hypothyroidism: Synthroid , check TSH. Hyperlipidemia: Well-controlled per last FLP. Stroke sequela: Subtle  right leg weakness, have a recent fall, after conversation we agreed to see PT for evaluation and treatment. Melanoma, history of, saw dermatology this year.  A follow-up was already scheduled per patient. RTC 4 months

## 2023-08-31 ENCOUNTER — Encounter: Payer: Self-pay | Admitting: Internal Medicine

## 2023-08-31 ENCOUNTER — Telehealth: Payer: Self-pay | Admitting: Internal Medicine

## 2023-08-31 LAB — URINE CULTURE
MICRO NUMBER:: 16413000
Result:: NO GROWTH
SPECIMEN QUALITY:: ADEQUATE

## 2023-08-31 NOTE — Assessment & Plan Note (Signed)
 Here for CPX  We also addressed the following Gout: On allopurinol , checking labs.    EtOH: Continue to be sober. Hypothyroidism: Synthroid , check TSH. Hyperlipidemia: Well-controlled per last FLP. Stroke sequela: Subtle right leg weakness, have a recent fall, after conversation we agreed to see PT for evaluation and treatment. Melanoma, history of, saw dermatology this year.  A follow-up was already scheduled per patient. RTC 4 months

## 2023-08-31 NOTE — Telephone Encounter (Signed)
 Good morning Dr. Elvin Hammer,    Patient  has a recall for colonoscopy for July 2025. Patient also has a recall for EGD for June 2025. Patient is requesting to know if he is able to have procedure done together. Please review and advise on scheduling.    Thank you.

## 2023-08-31 NOTE — Assessment & Plan Note (Signed)
 Here for CPX - Tdap: 2024 - PNM 13: 2016; PNA shot 23: 2009, 2019.  PMN 20- 2024 - s/p RSV per pt; s/p shingrix   - Vaccines I recommend , flu shot every fall.COVID booster if not done after 12-2022. -CCS: Last cscope 7/ 2015. Some Diverticulosis , Dr Elvin Hammer.  Due for a repeat  colonoscopy, likes to proceed, refer to GI -Prostate cancer screening: See comments under LUTS - labs:CMP TSH UA urine culture PSA uric acid -Diet exercise: Reports he is doing very well, take walks twice weekly.

## 2023-09-02 MED ORDER — LEVOTHYROXINE SODIUM 75 MCG PO TABS: 75.0000 ug | ORAL_TABLET | Freq: Every day | ORAL | 0 refills | Status: DC

## 2023-09-02 NOTE — Addendum Note (Signed)
 Addended by: Nasiah Lehenbauer D on: 09/02/2023 10:31 AM   Modules accepted: Orders

## 2023-09-08 ENCOUNTER — Ambulatory Visit: Attending: Internal Medicine

## 2023-09-08 VITALS — BP 133/75 | HR 67

## 2023-09-08 DIAGNOSIS — R29898 Other symptoms and signs involving the musculoskeletal system: Secondary | ICD-10-CM | POA: Diagnosis not present

## 2023-09-08 DIAGNOSIS — W19XXXA Unspecified fall, initial encounter: Secondary | ICD-10-CM | POA: Insufficient documentation

## 2023-09-08 DIAGNOSIS — R262 Difficulty in walking, not elsewhere classified: Secondary | ICD-10-CM | POA: Diagnosis not present

## 2023-09-08 DIAGNOSIS — I6930 Unspecified sequelae of cerebral infarction: Secondary | ICD-10-CM | POA: Insufficient documentation

## 2023-09-08 DIAGNOSIS — R2681 Unsteadiness on feet: Secondary | ICD-10-CM | POA: Diagnosis not present

## 2023-09-08 DIAGNOSIS — M6281 Muscle weakness (generalized): Secondary | ICD-10-CM | POA: Insufficient documentation

## 2023-09-08 DIAGNOSIS — R278 Other lack of coordination: Secondary | ICD-10-CM | POA: Diagnosis not present

## 2023-09-08 NOTE — Therapy (Signed)
 OUTPATIENT PHYSICAL THERAPY NEURO EVALUATION   Patient Name: Jeffrey Acosta MRN: [id number] DOB:1951-02-23, 73 y.o., male Today's Date: 09/08/2023   PCP: Devonna Foley, MD REFERRING PROVIDER: Devonna Foley, MD  END OF SESSION:  PT End of Session - 09/08/23 0845     Visit Number 1    Number of Visits 13    Date for PT Re-Evaluation 10/22/23    Authorization Type [employer] Medicare    Progress Note Due on Visit 10    PT Start Time 0843    PT Stop Time 0927    PT Time Calculation (min) 44 min    Equipment Utilized During Treatment Gait belt    Activity Tolerance Patient tolerated treatment well    Behavior During Therapy WFL for tasks assessed/performed             Past Medical History:  Diagnosis Date   Alcoholism in remission (HCC)    in AA   Arrhythmia    atrial tachycardia   Bipolar affective disorder (HCC)    Depression    Diverticulosis of colon (without mention of hemorrhage)    GERD (gastroesophageal reflux disease)    Gout of multiple sites    Hiatal hernia    Hx of migraines    migraines- last one since 9-10 yrs ago   Hydrocele    HYPERLIPIDEMIA 01/10/2009   Qualifier: Diagnosis of  By: Rochelle Chu CMA, Chemira    NMR Lipoprofile 2011 LDL could not be calculated  Due to TG of 889  (811  / 750 ),  HDL 50 . Father MI @ 41 PGM CVA early 84s; PGF CVA @72     Hypothyroidism    Melanoma of skin, site unspecified 11/25/2012   07/2011 abdominal  Stage 1 Dr Amy [place] Dr Jerilynn Montenegro and chest   Osteopenia    Polyarthralgia    Status post placement of implantable loop recorder 08/26/2020   Stroke (HCC)    x 3 - last one 04/2020   Subarachnoid hemorrhage [facility])    Past Surgical History:  Procedure Laterality Date   BILIARY BRUSHING  12/07/2018   Procedure: BILIARY BRUSHING;  Surgeon: Normie Becton., MD;  Location: [facility];  Service: Gastroenterology;;   BILIARY DILATION  12/07/2018   Procedure: BILIARY DILATION;  Surgeon: Normie Becton., MD;  Location: [facility];  Service: Gastroenterology;;   BIOPSY  12/07/2018   Procedure: BIOPSY;  Surgeon: Normie Becton., MD;  Location: [facility];  Service: Gastroenterology;;   CHOLECYSTECTOMY N/A 01/29/2021   Procedure: LAPAROSCOPIC CHOLECYSTECTOMY WITH ICG DYE;  Surgeon: Enid Harry, MD;  Location: [facility] OR;  Service: General;  Laterality: N/A;   CLOSED REDUCTION FINGER WITH PERCUTANEOUS PINNING Right 04/04/2021   Procedure: CLOSED REDUCTION WITH PERCUTANEOUS PINNING RIGHT THUMB;  Surgeon: Marilyn Shropshire, MD;  Location: [facility] OR;  Service: Orthopedics;  Laterality: Right;   COLONOSCOPY  2003   negative ; Dr Adan Holms   ERCP N/A 12/07/2018   Procedure: ENDOSCOPIC RETROGRADE CHOLANGIOPANCREATOGRAPHY (ERCP);  Surgeon: Normie Becton., MD;  Location: [facility];  Service: Gastroenterology;  Laterality: N/A;   ESOPHAGOGASTRODUODENOSCOPY (EGD) WITH PROPOFOL  N/A 12/07/2018   Procedure: ESOPHAGOGASTRODUODENOSCOPY (EGD) WITH PROPOFOL ;  Surgeon: Brice Campi Albino Alu., MD;  Location: [facility];  Service: Gastroenterology;  Laterality: N/A;   EUS N/A 12/07/2018   Procedure: UPPER ENDOSCOPIC ULTRASOUND (EUS) RADIAL;  Surgeon: Normie Becton., MD;  Location: [facility];  Service: Gastroenterology;  Laterality: N/A;   INGUINAL HERNIA REPAIR  08/07/2011   Procedure: HERNIA REPAIR INGUINAL  ADULT;  Surgeon: Enid Harry, MD;  Location: [facility];  Service: General;  Laterality: Right;   IR ANGIO INTRA EXTRACRAN SEL INTERNAL CAROTID BILAT MOD SED  05/14/2020   IR ANGIO VERTEBRAL SEL VERTEBRAL BILAT MOD SED  05/14/2020   IR GUIDED DRAIN W CATHETER PLACEMENT  02/05/2021   IR RADIOLOGIST EVAL & MGMT  02/13/2021   LOOP RECORDER INSERTION  06/2020   last remote check was on 03/12/21   MELANOMA EXCISION  07/28/2011   Dr Jerilynn Montenegro; stomach and chest   REMOVAL OF STONES  12/07/2018   Procedure: REMOVAL OF STONES;  Surgeon: Normie Becton., MD;  Location: [facility];  Service: Gastroenterology;;   TONSILLECTOMY  1951   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   UPPER GASTROINTESTINAL ENDOSCOPY  1983   hiatal hernia   VASECTOMY  1979   Patient Active Problem List   Diagnosis Date Noted   MCI (mild cognitive impairment) 03/09/2022   Thumb dislocation, right, initial encounter    Closed dislocation of interphalangeal joint of right thumb 04/03/2021   Dyslipidemia 05/23/2020   Benign essential HTN 05/06/2020   Subarachnoid hemorrhage (HCC) 04/30/2020   Leukopenia 07/15/2018   Thrombocytopenia (HCC) 07/15/2018   PCP NOTES >>>>>>> 03/23/2015   Annual physical exam 03/20/2015   Bronchospasm 09/06/2014   DJD (degenerative joint disease) 09/06/2014   Hypothyroidism 08/28/2014   Tremor 07/29/2014   Gait disorder 07/29/2014   Gout 01/08/2014   Vitamin D  deficiency 11/25/2012   Melanoma of skin (HCC) 11/25/2012   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HCC) 11/25/2012   Alcoholism in remission (HCC) 06/19/2011   Diverticulitis of colon (without mention of hemorrhage)(562.11) 06/19/2011   Bipolar I disorder (HCC) 06/19/2011   Osteopenia 05/17/2010   HYPERTRIGLYCERIDEMIA 08/02/2009   Macrocytic anemia 01/10/2009   ABNORMAL ELECTROCARDIOGRAM 11/26/2008   REFLUX, ESOPHAGEAL 11/17/2006   COMMON MIGRAINE 09/22/2006    ONSET DATE: 08/30/23 referral  REFERRING DIAG:  W09.Benny Braver (ICD-10-CM) - Fall, initial encounter  R29.898 (ICD-10-CM) - Right leg weakness  I69.30 (ICD-10-CM) - [place], post-stroke    THERAPY DIAG:  Unsteadiness on feet - Plan: PT plan of care cert/re-cert  Difficulty in walking, not elsewhere classified - Plan: PT plan of care cert/re-cert  Muscle weakness (generalized) - Plan: PT plan of care cert/re-cert  Other lack of coordination - Plan: PT plan of care cert/re-cert  Rationale for Evaluation and Treatment: Rehabilitation  SUBJECTIVE:  SUBJECTIVE STATEMENT: Patient was previously seen at this clinic for similar. Since last seen, has had 3 "major" falls resulting in fractured bones (leg and ribs). Does not have an AFO on RLE.  Pt accompanied by: self  PERTINENT HISTORY: Bipolar, GERD, Gout, migraines, HLD, hypothyroidism, osteopenia, melanoma, CVA, SAH  PAIN:  Are you having pain? No  PRECAUTIONS: Fall  WEIGHT BEARING RESTRICTIONS: No  FALLS: Has patient fallen in last 6 months? Yes. Number of falls 1; fell down 9 steps and broke 2 ribs  LIVING ENVIRONMENT: Lives with: lives with their family Lives in: House/apartment Stairs: Yes: Internal: flight steps; on right going up and External: 2 steps; can reach both Has following equipment at home: Single point cane, Walker - 2 wheeled, and shower chair  PLOF: Independent driving  PATIENT GOALS: "to get some strength back in my right leg"  OBJECTIVE:  Note: Objective measures were completed at Evaluation unless otherwise noted.  DIAGNOSTIC FINDINGS: 03/14/21 IMPRESSION: 1. Chronic bilateral subdural hygromas, left larger than right. 2. No acute intracranial abnormality.  COGNITION: Overall cognitive status: Within functional limits for tasks assessed   SENSATION: WFL  COORDINATION: WFL B LE heel shin/figure 8   LOWER EXTREMITY MMT:    MMT Right Eval Left Eval  Hip flexion 4 5  Hip extension    Hip abduction 5 5  Hip adduction 5 5  Hip internal rotation    Hip external rotation    Knee flexion 4 5  Knee extension 5 5  Ankle dorsiflexion 4 5  Ankle plantarflexion    Ankle inversion    Ankle eversion    (Blank rows = not tested)  BED MOBILITY:  Independent   STAIRS: Findings: Level of Assistance: Modified independence and SBA and Stair Negotiation Technique: Alternating Pattern  with Bilateral Rails GAIT: Findings: Gait Characteristics:  step through pattern, decreased arm swing- Right, decreased step length- Right, decreased hip/knee flexion- Right, decreased ankle dorsiflexion- Right, ataxic, trendelenburg, wide BOS, and poor foot clearance- Right, Distance walked: clinic, Assistive device utilized:None, and Level of assistance: SBA  FUNCTIONAL TESTS:   [facility] PT Assessment - 09/08/23 0001       Standardized Balance Assessment   Standardized Balance Assessment 10 meter walk test    10 Meter Walk 1.33m/s      Functional Gait  Assessment   Gait assessed  Yes    Gait Level Surface Walks 20 ft in less than 7 sec but greater than 5.5 sec, uses assistive device, slower speed, mild gait deviations, or deviates 6-10 in outside of the 12 in walkway width.    Change in Gait Speed Able to change speed, demonstrates mild gait deviations, deviates 6-10 in outside of the 12 in walkway width, or no gait deviations, unable to achieve a major change in velocity, or uses a change in velocity, or uses an assistive device.    Gait with Horizontal Head Turns Performs head turns smoothly with slight change in gait velocity (eg, minor disruption to smooth gait path), deviates 6-10 in outside 12 in walkway width, or uses an assistive device.    Gait with Vertical Head Turns Performs task with moderate change in gait velocity, slows down, deviates 10-15 in outside 12 in walkway width but recovers, can continue to walk.    Gait and Pivot Turn Turns slowly, requires verbal cueing, or requires several small steps to catch balance following turn and stop    Step Over Obstacle Is able to step over one shoe box (4.5  in total height) but must slow down and adjust steps to clear box safely. May require verbal cueing.    Gait with Narrow Base of Support Ambulates 4-7 steps.    Gait with Eyes Closed Walks 20 ft, slow speed, abnormal gait pattern, evidence for imbalance, deviates 10-15 in outside 12 in walkway width. Requires more than 9 sec to ambulate 20 ft.     Ambulating Backwards Walks 20 ft, uses assistive device, slower speed, mild gait deviations, deviates 6-10 in outside 12 in walkway width.    Steps Alternating feet, must use rail.    Total Score 15           MCTSIB: -condition 1: 30s mild sway -condition 2: 25s   PATIENT SURVEYS:  ABC scale 62.5%                                                                                                                              TREATMENT  Self care/home management: -role of PT -Initial HEP -Trial of AFO  PATIENT EDUCATION: Education details: PT POC, exam findings, see above Person educated: Patient Education method: Explanation, Demonstration, and Handouts Education comprehension: verbalized understanding and needs further education  HOME EXERCISE PROGRAM: Access Code: [email or web address] URL: [email or web address] Date: 09/08/2023 Prepared by: Nickola Baron  Exercises - Sit to Stand with Counter Support  - 1 x daily - 7 x weekly - 3 sets - 10 reps - Corner Balance Feet Together With Eyes Closed  - 1 x daily - 7 x weekly - 3 sets - 30s hold - Corner Balance Feet Together: Eyes Open With Head Turns  - 1 x daily - 7 x weekly - 3 sets - 30s hold - Semi-Tandem Corner Balance With Eyes Open  - 1 x daily - 7 x weekly - 3 sets - 30s hold  GOALS: Goals reviewed with patient? Yes  SHORT TERM GOALS: Target date: 10/01/23  Pt will be independent with initial HEP for improved functional strength and balance  Baseline: to be updated Goal status: INITIAL  2.  Pt will improve gait speed to >/= 1.53m/sto demonstrate improved community ambulation  Baseline: 1.69m/s Goal status: INITIAL  3.  Pt will improve FGA to >/= 20/30 to demonstrate improved balance and reduced fall risk  Baseline: 15/30 Goal status: INITIAL  4.  Patient will improve ABC score to >/=70% to demonstrate Improved confidence in functional abilities  Baseline: 62.5% Goal status: INITIAL  5. Conditions 3 and 4  of MCTSIB to be assessed and LTG to be written  Baseline: to be assessed  Goal status: INITIAL   LONG TERM GOALS: Target date: 10/22/23  Pt will be independent with final HEP for improved functional strength and balance  Baseline: to be updated Goal status: INITIAL  2.  Patient will improve ABC score to >/=75% to demonstrate improved confidence in functional abilities  Baseline: 62.5% Goal status: INITIAL  3.  Pt will improve FGA to >/= 25/30  to demonstrate improved balance and reduced fall risk  Baseline: 15/30 Goal status: INITIAL  4.  MCTSIB goal  Baseline: to be assessed Goal status: INITIAL   ASSESSMENT:  CLINICAL IMPRESSION: Patient is a 73 y.o. male who was seen today for physical therapy evaluation and treatment for impaired balance and strength. Roughly 3 years ago he sustained a brain bleed and multiple CVAs. Since then, his R LE has been functionally weaker, resulting in multiple falls, which have caused injuries. Patient demonstrates increased fall risk as noted by score of 15/30 on  Functional Gait Assessment.   <22/30 = predictive of falls, <20/30 = fall in 6 months, <18/30 = predictive of falls in PD MCID: 5 points stroke population, 4 points geriatric population (ANPTA Core Set of Outcome Measures for Adults with Neurologic Conditions, 2018). Activities-specific Balance Confidence Scale:  Score: 62.5% Increased risk of falls in community-dwelling, older adults <80% (79.89%)  0% = no confidence - 100% = complete confidence (ANPTA Core Set of Outcome Measures for Adults with Neurologic Conditions, 2018). 10 Meter Walk Test: Patient instructed to walk 10 meters (32.8 ft) as quickly and as safely as possible at their normal speed x2 and at a fast speed x2. Time measured from 2 meter mark to 8 meter mark to accommodate ramp-up and ramp-down.  Normal speed: 1.73m/s Cut off scores: <0.4 m/s = household Ambulator, 0.4-0.8 m/s = limited community Ambulator, >0.8 m/s =  community Ambulator, >1.2 m/s = crossing a street, <1.0 = increased fall risk MCID 0.05 m/s (small), 0.13 m/s (moderate), 0.06 m/s (significant)  (ANPTA Core Set of Outcome Measures for Adults with Neurologic Conditions, 2018). He would likely benefit from an AFO as well to aid in toe clearance during swing phase of gait. He would benefit from skilled PT services to address the above mentioned deficits.     OBJECTIVE IMPAIRMENTS: Abnormal gait, decreased balance, difficulty walking, and decreased strength.   ACTIVITY LIMITATIONS: carrying, lifting, stairs, locomotion level, and caring for others  PARTICIPATION LIMITATIONS: interpersonal relationship and community activity  PERSONAL FACTORS: Age, Past/current experiences, and Time since onset of injury/illness/exacerbation are also affecting patient's functional outcome.   REHAB POTENTIAL: Fair time since onset  CLINICAL DECISION MAKING: Stable/uncomplicated  EVALUATION COMPLEXITY: Low  PLAN:  PT FREQUENCY: 2x/week  PT DURATION: 6 weeks  PLANNED INTERVENTIONS: 97164- PT Re-evaluation, 97750- Physical Performance Testing, 97110-Therapeutic exercises, 97530- Therapeutic activity, [id number]- Neuromuscular re-education, 97535- Self Care, [id number]- Manual therapy, [phone]- Gait training, [phone]- Orthotic Initial, [phone]- Orthotic/Prosthetic subsequent, [phone]- Aquatic Therapy, [phone]- Electrical stimulation (manual), Patient/Family education, Balance training, Stair training, Taping, Dry Needling, Joint mobilization, Spinal mobilization, Vestibular training, Visual/preceptual remediation/compensation, Cognitive remediation, and DME instructions  PLAN FOR NEXT SESSION: trial R AFO, conditions 3+4 of MCTSIB + write LTG, R LE functional strength and balance   Rebecca Campus, PT Rebecca Campus, PT, DPT, CBIS  09/08/2023, 9:43 AM

## 2023-09-10 NOTE — Progress Notes (Signed)
 [facility] Summary Report / Loop Recorder

## 2023-09-12 NOTE — Telephone Encounter (Signed)
 Needs a routine office visit with me to discuss. Thanks

## 2023-09-14 ENCOUNTER — Encounter: Payer: Self-pay | Admitting: Physical Therapy

## 2023-09-14 ENCOUNTER — Ambulatory Visit: Admitting: Physical Therapy

## 2023-09-14 DIAGNOSIS — R278 Other lack of coordination: Secondary | ICD-10-CM | POA: Diagnosis not present

## 2023-09-14 DIAGNOSIS — R262 Difficulty in walking, not elsewhere classified: Secondary | ICD-10-CM | POA: Diagnosis not present

## 2023-09-14 DIAGNOSIS — W19XXXA Unspecified fall, initial encounter: Secondary | ICD-10-CM | POA: Diagnosis not present

## 2023-09-14 DIAGNOSIS — M6281 Muscle weakness (generalized): Secondary | ICD-10-CM

## 2023-09-14 DIAGNOSIS — R2681 Unsteadiness on feet: Secondary | ICD-10-CM

## 2023-09-14 DIAGNOSIS — I6930 Unspecified sequelae of cerebral infarction: Secondary | ICD-10-CM | POA: Diagnosis not present

## 2023-09-14 DIAGNOSIS — R29898 Other symptoms and signs involving the musculoskeletal system: Secondary | ICD-10-CM | POA: Diagnosis not present

## 2023-09-14 NOTE — Therapy (Signed)
 OUTPATIENT PHYSICAL THERAPY NEURO TREATMENT   Patient Name: Jeffrey Acosta MRN: [id number] DOB:1951-04-02, 73 y.o., male Today's Date: 09/14/2023   PCP: Devonna Foley, MD REFERRING PROVIDER: Devonna Foley, MD  END OF SESSION:  PT End of Session - 09/14/23 0849     Visit Number 2    Number of Visits 13    Date for PT Re-Evaluation 10/22/23    Authorization Type [employer] Medicare    Progress Note Due on Visit 10    PT Start Time 0845    PT Stop Time 0930    PT Time Calculation (min) 45 min    Equipment Utilized During Treatment Gait belt    Activity Tolerance Patient tolerated treatment well    Behavior During Therapy WFL for tasks assessed/performed             Past Medical History:  Diagnosis Date   Alcoholism in remission (HCC)    in AA   Arrhythmia    atrial tachycardia   Bipolar affective disorder (HCC)    Depression    Diverticulosis of colon (without mention of hemorrhage)    GERD (gastroesophageal reflux disease)    Gout of multiple sites    Hiatal hernia    Hx of migraines    migraines- last one since 9-10 yrs ago   Hydrocele    HYPERLIPIDEMIA 01/10/2009   Qualifier: Diagnosis of  By: Rochelle Chu CMA, Chemira    NMR Lipoprofile 2011 LDL could not be calculated  Due to TG of 889  (811  / 750 ),  HDL 50 . Father MI @ 36 PGM CVA early 60s; PGF CVA @72     Hypothyroidism    Melanoma of skin, site unspecified 11/25/2012   07/2011 abdominal  Stage 1 Dr Amy [place] Dr Jerilynn Montenegro and chest   Osteopenia    Polyarthralgia    Status post placement of implantable loop recorder 08/26/2020   Stroke (HCC)    x 3 - last one 04/2020   Subarachnoid hemorrhage [facility])    Past Surgical History:  Procedure Laterality Date   BILIARY BRUSHING  12/07/2018   Procedure: BILIARY BRUSHING;  Surgeon: Normie Becton., MD;  Location: [facility];  Service: Gastroenterology;;   BILIARY DILATION  12/07/2018   Procedure: BILIARY DILATION;  Surgeon: Normie Becton., MD;  Location: [facility];  Service: Gastroenterology;;   BIOPSY  12/07/2018   Procedure: BIOPSY;  Surgeon: Normie Becton., MD;  Location: [facility];  Service: Gastroenterology;;   CHOLECYSTECTOMY N/A 01/29/2021   Procedure: LAPAROSCOPIC CHOLECYSTECTOMY WITH ICG DYE;  Surgeon: Enid Harry, MD;  Location: [facility] OR;  Service: General;  Laterality: N/A;   CLOSED REDUCTION FINGER WITH PERCUTANEOUS PINNING Right 04/04/2021   Procedure: CLOSED REDUCTION WITH PERCUTANEOUS PINNING RIGHT THUMB;  Surgeon: Marilyn Shropshire, MD;  Location: [facility] OR;  Service: Orthopedics;  Laterality: Right;   COLONOSCOPY  2003   negative ; Dr Adan Holms   ERCP N/A 12/07/2018   Procedure: ENDOSCOPIC RETROGRADE CHOLANGIOPANCREATOGRAPHY (ERCP);  Surgeon: Normie Becton., MD;  Location: [facility];  Service: Gastroenterology;  Laterality: N/A;   ESOPHAGOGASTRODUODENOSCOPY (EGD) WITH PROPOFOL  N/A 12/07/2018   Procedure: ESOPHAGOGASTRODUODENOSCOPY (EGD) WITH PROPOFOL ;  Surgeon: Brice Campi Albino Alu., MD;  Location: [facility];  Service: Gastroenterology;  Laterality: N/A;   EUS N/A 12/07/2018   Procedure: UPPER ENDOSCOPIC ULTRASOUND (EUS) RADIAL;  Surgeon: Normie Becton., MD;  Location: [facility];  Service: Gastroenterology;  Laterality: N/A;   INGUINAL HERNIA REPAIR  08/07/2011   Procedure: HERNIA REPAIR INGUINAL  ADULT;  Surgeon: Enid Harry, MD;  Location: [facility];  Service: General;  Laterality: Right;   IR ANGIO INTRA EXTRACRAN SEL INTERNAL CAROTID BILAT MOD SED  05/14/2020   IR ANGIO VERTEBRAL SEL VERTEBRAL BILAT MOD SED  05/14/2020   IR GUIDED DRAIN W CATHETER PLACEMENT  02/05/2021   IR RADIOLOGIST EVAL & MGMT  02/13/2021   LOOP RECORDER INSERTION  06/2020   last remote check was on 03/12/21   MELANOMA EXCISION  07/28/2011   Dr Jerilynn Montenegro; stomach and chest   REMOVAL OF STONES  12/07/2018   Procedure: REMOVAL OF STONES;  Surgeon: Normie Becton., MD;  Location: [facility];  Service: Gastroenterology;;   TONSILLECTOMY  1951   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   UPPER GASTROINTESTINAL ENDOSCOPY  1983   hiatal hernia   VASECTOMY  1979   Patient Active Problem List   Diagnosis Date Noted   MCI (mild cognitive impairment) 03/09/2022   Thumb dislocation, right, initial encounter    Closed dislocation of interphalangeal joint of right thumb 04/03/2021   Dyslipidemia 05/23/2020   Benign essential HTN 05/06/2020   Subarachnoid hemorrhage (HCC) 04/30/2020   Leukopenia 07/15/2018   Thrombocytopenia (HCC) 07/15/2018   PCP NOTES >>>>>>> 03/23/2015   Annual physical exam 03/20/2015   Bronchospasm 09/06/2014   DJD (degenerative joint disease) 09/06/2014   Hypothyroidism 08/28/2014   Tremor 07/29/2014   Gait disorder 07/29/2014   Gout 01/08/2014   Vitamin D  deficiency 11/25/2012   Melanoma of skin (HCC) 11/25/2012   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HCC) 11/25/2012   Alcoholism in remission (HCC) 06/19/2011   Diverticulitis of colon (without mention of hemorrhage)(562.11) 06/19/2011   Bipolar I disorder (HCC) 06/19/2011   Osteopenia 05/17/2010   HYPERTRIGLYCERIDEMIA 08/02/2009   Macrocytic anemia 01/10/2009   ABNORMAL ELECTROCARDIOGRAM 11/26/2008   REFLUX, ESOPHAGEAL 11/17/2006   COMMON MIGRAINE 09/22/2006    ONSET DATE: 08/30/23 referral  REFERRING DIAG:  Z61.Benny Braver (ICD-10-CM) - Fall, initial encounter  R29.898 (ICD-10-CM) - Right leg weakness  I69.30 (ICD-10-CM) - [place], post-stroke    THERAPY DIAG:  Unsteadiness on feet  Difficulty in walking, not elsewhere classified  Muscle weakness (generalized)  Other lack of coordination  Rationale for Evaluation and Treatment: Rehabilitation  SUBJECTIVE:                                                                                                                                                                                             SUBJECTIVE STATEMENT: Patient reports  "balance is an issue everyday".  He denies recent falls or acute changes. Pt accompanied by: self  PERTINENT  HISTORY: Bipolar, GERD, Gout, migraines, HLD, hypothyroidism, osteopenia, melanoma, CVA, SAH  PAIN:  Are you having pain? No  PRECAUTIONS: Fall  WEIGHT BEARING RESTRICTIONS: No  FALLS: Has patient fallen in last 6 months? Yes. Number of falls 1; fell down 9 steps and broke 2 ribs  LIVING ENVIRONMENT: Lives with: lives with their family Lives in: House/apartment Stairs: Yes: Internal: flight steps; on right going up and External: 2 steps; can reach both Has following equipment at home: Single point cane, Walker - 2 wheeled, and shower chair  PLOF: Independent driving  PATIENT GOALS: "to get some strength back in my right leg"  OBJECTIVE:  Note: Objective measures were completed at Evaluation unless otherwise noted.  DIAGNOSTIC FINDINGS: 03/14/21 IMPRESSION: 1. Chronic bilateral subdural hygromas, left larger than right. 2. No acute intracranial abnormality.  COGNITION: Overall cognitive status: Within functional limits for tasks assessed   SENSATION: WFL  COORDINATION: WFL B LE heel shin/figure 8   LOWER EXTREMITY MMT:    MMT Right Eval Left Eval  Hip flexion 4 5  Hip extension    Hip abduction 5 5  Hip adduction 5 5  Hip internal rotation    Hip external rotation    Knee flexion 4 5  Knee extension 5 5  Ankle dorsiflexion 4 5  Ankle plantarflexion    Ankle inversion    Ankle eversion    (Blank rows = not tested)  BED MOBILITY:  Independent   STAIRS: Findings: Level of Assistance: Modified independence and SBA and Stair Negotiation Technique: Alternating Pattern  with Bilateral Rails GAIT: Findings: Gait Characteristics: step through pattern, decreased arm swing- Right, decreased step length- Right, decreased hip/knee flexion- Right, decreased ankle dorsiflexion- Right, ataxic, trendelenburg, wide BOS, and poor foot clearance- Right, Distance  walked: clinic, Assistive device utilized:None, and Level of assistance: SBA  FUNCTIONAL TESTS:   MCTSIB: -condition 1: 30s mild sway -condition 2: 25s   PATIENT SURVEYS:  ABC scale 62.5%                                                                                                                              TREATMENT  -mCTSIB condition 3 avg: 30 sec, only mild sway at onset of first trial -mCTSIB condition 4 avg: 30 sec, moderate sway each of 3 trials  -Introduced AFO options and donning/doffing process w/ min-modA:  Garrie Kand creates a questionable hyperextension moment at toe off - maybe the rigidity?  He has some intermittent midfoot scuff w/ both, seemed worse with Garrie Kand.  Pt preference for Ott[place]ock out of the 2 OTS AFO options.  Trialed right foot-up - pt likes ankle support, comfort and has good consistent toe clearance over level indoor surfaces.  115 ft x2 (AFO options) + 230 ft (foot-up).  PATIENT EDUCATION: Education details: Process for obtaining AFO/possible OOP costs for AFO vs foot-up, discussed benefits for each type of brace tried today.  Continue HEP - try last 2 exercises as  he has not yet and will review in session next visit. Person educated: Patient Education method: Explanation, Demonstration, and Handouts Education comprehension: verbalized understanding and needs further education  HOME EXERCISE PROGRAM: Access Code: [email or web address] URL: [email or web address] Date: 09/08/2023 Prepared by: Nickola Baron  Exercises - Sit to Stand with Counter Support  - 1 x daily - 7 x weekly - 3 sets - 10 reps - Corner Balance Feet Together With Eyes Closed  - 1 x daily - 7 x weekly - 3 sets - 30s hold - Corner Balance Feet Together: Eyes Open With Head Turns  - 1 x daily - 7 x weekly - 3 sets - 30s hold - Semi-Tandem Corner Balance With Eyes Open  - 1 x daily - 7 x weekly - 3 sets - 30s hold  GOALS: Goals reviewed with patient? Yes  SHORT TERM GOALS:  Target date: 10/01/23  Pt will be independent with initial HEP for improved functional strength and balance  Baseline: to be updated Goal status: INITIAL  2.  Pt will improve gait speed to >/= 1.74m/sto demonstrate improved community ambulation  Baseline: 1.44m/s Goal status: INITIAL  3.  Pt will improve FGA to >/= 20/30 to demonstrate improved balance and reduced fall risk  Baseline: 15/30 Goal status: INITIAL  4.  Patient will improve ABC score to >/=70% to demonstrate Improved confidence in functional abilities  Baseline: 62.5% Goal status: INITIAL  5. Conditions 3 and 4 of MCTSIB to be assessed and LTG to be written  Baseline: Assessed 5/20  Goal status: MET   LONG TERM GOALS: Target date: 10/22/23  Pt will be independent with final HEP for improved functional strength and balance  Baseline: to be updated Goal status: INITIAL  2.  Patient will improve ABC score to >/=75% to demonstrate improved confidence in functional abilities  Baseline: 62.5% Goal status: INITIAL  3.  Pt will improve FGA to >/= 25/30 to demonstrate improved balance and reduced fall risk  Baseline: 15/30 Goal status: INITIAL  4.  Patient will demonstrate no sway during condition 4 of the mCTSIB in order to demonstrate improved balance strategies. Baseline: moderate sway, no LOB (5/20) Goal status: INITIAL   ASSESSMENT:  CLINICAL IMPRESSION: Patient demonstrates fair compensation on conditions 3 and 4 of mCTSIB today, but moderate sway on all trials of condition 4 indicating some need for vestibular "tune-up".  He does benefit from bracing options trialed today.  He prefers Ott[place]ock AFO and foot-up options and demonstrates improved toe clearance mechanics with each, but would likely benefit from further trials over unlevel and compliant surfaces to determine maximum benefit from each.  PT will help pt make best decision in regards to this in coming visit and PT today requested he hold off on  purchasing foot-up brace until further decisions made.  Continue per POC.     OBJECTIVE IMPAIRMENTS: Abnormal gait, decreased balance, difficulty walking, and decreased strength.   ACTIVITY LIMITATIONS: carrying, lifting, stairs, locomotion level, and caring for others  PARTICIPATION LIMITATIONS: interpersonal relationship and community activity  PERSONAL FACTORS: Age, Past/current experiences, and Time since onset of injury/illness/exacerbation are also affecting patient's functional outcome.   REHAB POTENTIAL: Fair time since onset  CLINICAL DECISION MAKING: Stable/uncomplicated  EVALUATION COMPLEXITY: Low  PLAN:  PT FREQUENCY: 2x/week  PT DURATION: 6 weeks  PLANNED INTERVENTIONS: [id number]- PT Re-evaluation, 97750- Physical Performance Testing, 97110-Therapeutic exercises, 97530- Therapeutic activity, [id number]- Neuromuscular re-education, 97535- Self Care, [id number]- Manual therapy, [id number]- Gait training, [id number]- Orthotic Initial, [id number]- Orthotic/Prosthetic subsequent, [phone]-  Aquatic Therapy, [phone]- Electrical stimulation (manual), Patient/Family education, Balance training, Stair training, Taping, Dry Needling, Joint mobilization, Spinal mobilization, Vestibular training, Visual/preceptual remediation/compensation, Cognitive remediation, and DME instructions  PLAN FOR NEXT SESSION: trial R [place] AFO vs foot-up w/ grass/sidewalk/curb and help pt decide one or both to pursue, R LE functional strength and balance, review last 2 HEP exercises to ensure pt safety/confidence.   Earlean Glaze, PT, DPT  09/14/2023, 9:36 AM

## 2023-09-17 ENCOUNTER — Other Ambulatory Visit: Payer: Self-pay | Admitting: Internal Medicine

## 2023-09-17 ENCOUNTER — Ambulatory Visit

## 2023-09-17 VITALS — BP 160/77 | HR 58

## 2023-09-17 DIAGNOSIS — I6930 Unspecified sequelae of cerebral infarction: Secondary | ICD-10-CM | POA: Diagnosis not present

## 2023-09-17 DIAGNOSIS — R262 Difficulty in walking, not elsewhere classified: Secondary | ICD-10-CM

## 2023-09-17 DIAGNOSIS — R2681 Unsteadiness on feet: Secondary | ICD-10-CM | POA: Diagnosis not present

## 2023-09-17 DIAGNOSIS — R278 Other lack of coordination: Secondary | ICD-10-CM

## 2023-09-17 DIAGNOSIS — M6281 Muscle weakness (generalized): Secondary | ICD-10-CM | POA: Diagnosis not present

## 2023-09-17 DIAGNOSIS — R29898 Other symptoms and signs involving the musculoskeletal system: Secondary | ICD-10-CM | POA: Diagnosis not present

## 2023-09-17 DIAGNOSIS — W19XXXA Unspecified fall, initial encounter: Secondary | ICD-10-CM | POA: Diagnosis not present

## 2023-09-17 NOTE — Telephone Encounter (Signed)
 Patient scheduled for 6/30 with PA.

## 2023-09-17 NOTE — Therapy (Signed)
 OUTPATIENT PHYSICAL THERAPY NEURO TREATMENT   Patient Name: Jeffrey Acosta MRN: [id number] DOB:09/27/1950, 73 y.o., male Today's Date: 09/17/2023   PCP: Devonna Foley, MD REFERRING PROVIDER: Devonna Foley, MD  END OF SESSION:  PT End of Session - 09/17/23 1039     Visit Number 3    Number of Visits 13    Date for PT Re-Evaluation 10/22/23    Authorization Type BCBS Medicare    Progress Note Due on Visit 10    PT Start Time 1100    PT Stop Time 1148    PT Time Calculation (min) 48 min    Equipment Utilized During Treatment Gait belt    Activity Tolerance Patient tolerated treatment well    Behavior During Therapy WFL for tasks assessed/performed             Past Medical History:  Diagnosis Date   Alcoholism in remission (HCC)    in AA   Arrhythmia    atrial tachycardia   Bipolar affective disorder (HCC)    Depression    Diverticulosis of colon (without mention of hemorrhage)    GERD (gastroesophageal reflux disease)    Gout of multiple sites    Hiatal hernia    Hx of migraines    migraines- last one since 9-10 yrs ago   Hydrocele    HYPERLIPIDEMIA 01/10/2009   Qualifier: Diagnosis of  By: Rochelle Chu CMA, Chemira    NMR Lipoprofile 2011 LDL could not be calculated  Due to TG of 889  (811  / 750 ),  HDL 50 . Father MI @ 41 PGM CVA early 89s; PGF CVA @72     Hypothyroidism    Melanoma of skin, site unspecified 11/25/2012   07/2011 abdominal  Stage 1 Dr Amy [place] Dr Jerilynn Montenegro and chest   Osteopenia    Polyarthralgia    Status post placement of implantable loop recorder 08/26/2020   Stroke (HCC)    x 3 - last one 04/2020   Subarachnoid hemorrhage [facility])    Past Surgical History:  Procedure Laterality Date   BILIARY BRUSHING  12/07/2018   Procedure: BILIARY BRUSHING;  Surgeon: Normie Becton., MD;  Location: [facility];  Service: Gastroenterology;;   BILIARY DILATION  12/07/2018   Procedure: BILIARY DILATION;  Surgeon: Normie Becton., MD;  Location: [facility];  Service: Gastroenterology;;   BIOPSY  12/07/2018   Procedure: BIOPSY;  Surgeon: Normie Becton., MD;  Location: [facility];  Service: Gastroenterology;;   CHOLECYSTECTOMY N/A 01/29/2021   Procedure: LAPAROSCOPIC CHOLECYSTECTOMY WITH ICG DYE;  Surgeon: Enid Harry, MD;  Location: [facility] OR;  Service: General;  Laterality: N/A;   CLOSED REDUCTION FINGER WITH PERCUTANEOUS PINNING Right 04/04/2021   Procedure: CLOSED REDUCTION WITH PERCUTANEOUS PINNING RIGHT THUMB;  Surgeon: Marilyn Shropshire, MD;  Location: [facility] OR;  Service: Orthopedics;  Laterality: Right;   COLONOSCOPY  2003   negative ; Dr Adan Holms   ERCP N/A 12/07/2018   Procedure: ENDOSCOPIC RETROGRADE CHOLANGIOPANCREATOGRAPHY (ERCP);  Surgeon: Normie Becton., MD;  Location: [facility];  Service: Gastroenterology;  Laterality: N/A;   ESOPHAGOGASTRODUODENOSCOPY (EGD) WITH PROPOFOL  N/A 12/07/2018   Procedure: ESOPHAGOGASTRODUODENOSCOPY (EGD) WITH PROPOFOL ;  Surgeon: Brice Campi Albino Alu., MD;  Location: [facility];  Service: Gastroenterology;  Laterality: N/A;   EUS N/A 12/07/2018   Procedure: UPPER ENDOSCOPIC ULTRASOUND (EUS) RADIAL;  Surgeon: Normie Becton., MD;  Location: [facility];  Service: Gastroenterology;  Laterality: N/A;   INGUINAL HERNIA REPAIR  08/07/2011   Procedure: HERNIA REPAIR INGUINAL  ADULT;  Surgeon: Enid Harry, MD;  Location: [facility];  Service: General;  Laterality: Right;   IR ANGIO INTRA EXTRACRAN SEL INTERNAL CAROTID BILAT MOD SED  05/14/2020   IR ANGIO VERTEBRAL SEL VERTEBRAL BILAT MOD SED  05/14/2020   IR GUIDED DRAIN W CATHETER PLACEMENT  02/05/2021   IR RADIOLOGIST EVAL & MGMT  02/13/2021   LOOP RECORDER INSERTION  06/2020   last remote check was on 03/12/21   MELANOMA EXCISION  07/28/2011   Dr Jerilynn Montenegro; stomach and chest   REMOVAL OF STONES  12/07/2018   Procedure: REMOVAL OF STONES;  Surgeon: Normie Becton., MD;  Location: [facility];  Service: Gastroenterology;;   TONSILLECTOMY  1951   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   UPPER GASTROINTESTINAL ENDOSCOPY  1983   hiatal hernia   VASECTOMY  1979   Patient Active Problem List   Diagnosis Date Noted   MCI (mild cognitive impairment) 03/09/2022   Thumb dislocation, right, initial encounter    Closed dislocation of interphalangeal joint of right thumb 04/03/2021   Dyslipidemia 05/23/2020   Benign essential HTN 05/06/2020   Subarachnoid hemorrhage (HCC) 04/30/2020   Leukopenia 07/15/2018   Thrombocytopenia (HCC) 07/15/2018   PCP NOTES >>>>>>> 03/23/2015   Annual physical exam 03/20/2015   Bronchospasm 09/06/2014   DJD (degenerative joint disease) 09/06/2014   Hypothyroidism 08/28/2014   Tremor 07/29/2014   Gait disorder 07/29/2014   Gout 01/08/2014   Vitamin D  deficiency 11/25/2012   Melanoma of skin (HCC) 11/25/2012   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HCC) 11/25/2012   Alcoholism in remission (HCC) 06/19/2011   Diverticulitis of colon (without mention of hemorrhage)(562.11) 06/19/2011   Bipolar I disorder (HCC) 06/19/2011   Osteopenia 05/17/2010   HYPERTRIGLYCERIDEMIA 08/02/2009   Macrocytic anemia 01/10/2009   ABNORMAL ELECTROCARDIOGRAM 11/26/2008   REFLUX, ESOPHAGEAL 11/17/2006   COMMON MIGRAINE 09/22/2006    ONSET DATE: 08/30/23 referral  REFERRING DIAG:  U98.Benny Braver (ICD-10-CM) - Fall, initial encounter  R29.898 (ICD-10-CM) - Right leg weakness  I69.30 (ICD-10-CM) - Sequela, post-stroke    THERAPY DIAG:  Unsteadiness on feet  Muscle weakness (generalized)  Difficulty in walking, not elsewhere classified  Other lack of coordination  Rationale for Evaluation and Treatment: Rehabilitation  SUBJECTIVE:                                                                                                                                                                                             SUBJECTIVE STATEMENT: Patient reports  doing well. Did have a stumble when leaving here last time, but no falls. Prefers to use a foot  up brace vs AFO.  Pt accompanied by: self  PERTINENT HISTORY: Bipolar, GERD, Gout, migraines, HLD, hypothyroidism, osteopenia, melanoma, CVA, SAH  PAIN:  Are you having pain? No  PRECAUTIONS: Fall  PATIENT GOALS: "to get some strength back in my right leg"  OBJECTIVE:  Note: Objective measures were completed at Evaluation unless otherwise noted.  DIAGNOSTIC FINDINGS: 03/14/21 IMPRESSION: 1. Chronic bilateral subdural hygromas, left larger than right. 2. No acute intracranial abnormality.                                                                                                                              TREATMENT  Gait: -trial of R foot up brace over uneven surfaces, over grass, up and down curbs  -grossly supervision   NMR: -4-way ankle using red theraband  -ankle alphabet  -R ankle AROM coordination on wobble board  PATIENT EDUCATION: Education details: expanded HEP, where to get foot up brace Person educated: Patient Education method: Explanation, Demonstration, and Handouts Education comprehension: verbalized understanding and needs further education  HOME EXERCISE PROGRAM: Access Code: [email or web address] URL: [email or web address] Date: 09/08/2023 Prepared by: Nickola Baron  Exercises - Sit to Stand with Counter Support  - 1 x daily - 7 x weekly - 3 sets - 10 reps - Corner Balance Feet Together With Eyes Closed  - 1 x daily - 7 x weekly - 3 sets - 30s hold - Corner Balance Feet Together: Eyes Open With Head Turns  - 1 x daily - 7 x weekly - 3 sets - 30s hold - Semi-Tandem Corner Balance With Eyes Open  - 1 x daily - 7 x weekly - 3 sets - 30s hold - Long Sitting Ankle Eversion with Resistance  - 1 x daily - 7 x weekly - 3 sets - 10 reps - Long Sitting Ankle Plantar Flexion with Resistance  - 1 x daily - 7 x weekly - 3 sets - 10 reps - Long Sitting Ankle  Inversion with Resistance  - 1 x daily - 7 x weekly - 3 sets - 10 reps - Long Sitting Ankle Dorsiflexion with Anchored Resistance  - 1 x daily - 7 x weekly - 3 sets - 10 reps - Seated Ankle Alphabet  - 1 x daily - 7 x weekly - 3 sets  GOALS: Goals reviewed with patient? Yes  SHORT TERM GOALS: Target date: 10/01/23  Pt will be independent with initial HEP for improved functional strength and balance  Baseline: to be updated Goal status: INITIAL  2.  Pt will improve gait speed to >/= 1.64m/sto demonstrate improved community ambulation  Baseline: 1.6m/s Goal status: INITIAL  3.  Pt will improve FGA to >/= 20/30 to demonstrate improved balance and reduced fall risk  Baseline: 15/30 Goal status: INITIAL  4.  Patient will improve ABC score to >/=70% to demonstrate Improved confidence in functional abilities  Baseline: 62.5% Goal status: INITIAL  5. Conditions 3 and 4 of MCTSIB  to be assessed and LTG to be written  Baseline: Assessed 5/20  Goal status: MET   LONG TERM GOALS: Target date: 10/22/23  Pt will be independent with final HEP for improved functional strength and balance  Baseline: to be updated Goal status: INITIAL  2.  Patient will improve ABC score to >/=75% to demonstrate improved confidence in functional abilities  Baseline: 62.5% Goal status: INITIAL  3.  Pt will improve FGA to >/= 25/30 to demonstrate improved balance and reduced fall risk  Baseline: 15/30 Goal status: INITIAL  4.  Patient will demonstrate no sway during condition 4 of the mCTSIB in order to demonstrate improved balance strategies. Baseline: moderate sway, no LOB (5/20) Goal status: INITIAL   ASSESSMENT:  CLINICAL IMPRESSION: Patient seen for skilled PT session with emphasis on gait training with foot up brace and R ankle NMR. Improved overall gait mechanics with use of foot up brace including heel strike and foot clearance. Did require multimodal cues, including demonstration for increased  step height over tall grass for foot clearance of obstacles. PT printed out resources for where patient could purchase a foot up brace. Remainder of session targeting strength and coordination of R ankle. Continue POC.   OBJECTIVE IMPAIRMENTS: Abnormal gait, decreased balance, difficulty walking, and decreased strength.   ACTIVITY LIMITATIONS: carrying, lifting, stairs, locomotion level, and caring for others  PARTICIPATION LIMITATIONS: interpersonal relationship and community activity  PERSONAL FACTORS: Age, Past/current experiences, and Time since onset of injury/illness/exacerbation are also affecting patient's functional outcome.   REHAB POTENTIAL: Fair time since onset  CLINICAL DECISION MAKING: Stable/uncomplicated  EVALUATION COMPLEXITY: Low  PLAN:  PT FREQUENCY: 2x/week  PT DURATION: 6 weeks  PLANNED INTERVENTIONS: 97164- PT Re-evaluation, 97750- Physical Performance Testing, 97110-Therapeutic exercises, 97530- Therapeutic activity, 97112- Neuromuscular re-education, [phone]- Self Care, 60454- Manual therapy, [phone]- Gait training, [phone]- Orthotic Initial, [phone]- Orthotic/Prosthetic subsequent, [phone]- Aquatic Therapy, [phone]- Electrical stimulation (manual), Patient/Family education, Balance training, Stair training, Taping, Dry Needling, Joint mobilization, Spinal mobilization, Vestibular training, Visual/preceptual remediation/compensation, Cognitive remediation, and DME instructions  PLAN FOR NEXT SESSION:  R LE functional strength and balance, balance recovery    Rebecca Campus, PT Rebecca Campus, PT, DPT, CBIS  09/17/2023, 11:51 AM

## 2023-09-21 ENCOUNTER — Ambulatory Visit: Admitting: Physical Therapy

## 2023-09-22 ENCOUNTER — Encounter: Payer: Self-pay | Admitting: Physical Therapy

## 2023-09-22 ENCOUNTER — Ambulatory Visit: Admitting: Physical Therapy

## 2023-09-22 ENCOUNTER — Telehealth: Payer: Self-pay | Admitting: Internal Medicine

## 2023-09-22 DIAGNOSIS — R278 Other lack of coordination: Secondary | ICD-10-CM

## 2023-09-22 DIAGNOSIS — R2681 Unsteadiness on feet: Secondary | ICD-10-CM | POA: Diagnosis not present

## 2023-09-22 DIAGNOSIS — M6281 Muscle weakness (generalized): Secondary | ICD-10-CM | POA: Diagnosis not present

## 2023-09-22 DIAGNOSIS — I6930 Unspecified sequelae of cerebral infarction: Secondary | ICD-10-CM | POA: Diagnosis not present

## 2023-09-22 DIAGNOSIS — R262 Difficulty in walking, not elsewhere classified: Secondary | ICD-10-CM

## 2023-09-22 DIAGNOSIS — W19XXXA Unspecified fall, initial encounter: Secondary | ICD-10-CM | POA: Diagnosis not present

## 2023-09-22 DIAGNOSIS — R29898 Other symptoms and signs involving the musculoskeletal system: Secondary | ICD-10-CM | POA: Diagnosis not present

## 2023-09-22 NOTE — Therapy (Signed)
 OUTPATIENT PHYSICAL THERAPY NEURO TREATMENT   Patient Name: Jeffrey Acosta MRN: [id number] DOB:December 22, 1950, 73 y.o., male Today's Date: 09/22/2023   PCP: Devonna Foley, MD REFERRING PROVIDER: Devonna Foley, MD  END OF SESSION:  PT End of Session - 09/22/23 0916     Visit Number 4    Number of Visits 13    Date for PT Re-Evaluation 10/22/23    Authorization Type BCBS Medicare    Progress Note Due on Visit 10    PT Start Time 0914    PT Stop Time 1001    PT Time Calculation (min) 47 min    Equipment Utilized During Treatment Gait belt    Activity Tolerance Patient tolerated treatment well    Behavior During Therapy WFL for tasks assessed/performed             Past Medical History:  Diagnosis Date   Alcoholism in remission (HCC)    in AA   Arrhythmia    atrial tachycardia   Bipolar affective disorder (HCC)    Depression    Diverticulosis of colon (without mention of hemorrhage)    GERD (gastroesophageal reflux disease)    Gout of multiple sites    Hiatal hernia    Hx of migraines    migraines- last one since 9-10 yrs ago   Hydrocele    HYPERLIPIDEMIA 01/10/2009   Qualifier: Diagnosis of  By: Rochelle Chu CMA, Chemira    NMR Lipoprofile 2011 LDL could not be calculated  Due to TG of 889  (811  / 750 ),  HDL 50 . Father MI @ 65 PGM CVA early 42s; PGF CVA @72     Hypothyroidism    Melanoma of skin, site unspecified 11/25/2012   07/2011 abdominal  Stage 1 Dr Amy [place] Dr Jerilynn Montenegro and chest   Osteopenia    Polyarthralgia    Status post placement of implantable loop recorder 08/26/2020   Stroke (HCC)    x 3 - last one 04/2020   Subarachnoid hemorrhage [facility])    Past Surgical History:  Procedure Laterality Date   BILIARY BRUSHING  12/07/2018   Procedure: BILIARY BRUSHING;  Surgeon: Normie Becton., MD;  Location: [facility];  Service: Gastroenterology;;   BILIARY DILATION  12/07/2018   Procedure: BILIARY DILATION;  Surgeon: Normie Becton., MD;  Location: [facility];  Service: Gastroenterology;;   BIOPSY  12/07/2018   Procedure: BIOPSY;  Surgeon: Normie Becton., MD;  Location: [facility];  Service: Gastroenterology;;   CHOLECYSTECTOMY N/A 01/29/2021   Procedure: LAPAROSCOPIC CHOLECYSTECTOMY WITH ICG DYE;  Surgeon: Enid Harry, MD;  Location: [facility] OR;  Service: General;  Laterality: N/A;   CLOSED REDUCTION FINGER WITH PERCUTANEOUS PINNING Right 04/04/2021   Procedure: CLOSED REDUCTION WITH PERCUTANEOUS PINNING RIGHT THUMB;  Surgeon: Marilyn Shropshire, MD;  Location: [facility] OR;  Service: Orthopedics;  Laterality: Right;   COLONOSCOPY  2003   negative ; Dr Adan Holms   ERCP N/A 12/07/2018   Procedure: ENDOSCOPIC RETROGRADE CHOLANGIOPANCREATOGRAPHY (ERCP);  Surgeon: Normie Becton., MD;  Location: [facility];  Service: Gastroenterology;  Laterality: N/A;   ESOPHAGOGASTRODUODENOSCOPY (EGD) WITH PROPOFOL  N/A 12/07/2018   Procedure: ESOPHAGOGASTRODUODENOSCOPY (EGD) WITH PROPOFOL ;  Surgeon: Brice Campi Albino Alu., MD;  Location: [facility];  Service: Gastroenterology;  Laterality: N/A;   EUS N/A 12/07/2018   Procedure: UPPER ENDOSCOPIC ULTRASOUND (EUS) RADIAL;  Surgeon: Normie Becton., MD;  Location: [facility];  Service: Gastroenterology;  Laterality: N/A;   INGUINAL HERNIA REPAIR  08/07/2011   Procedure: HERNIA REPAIR INGUINAL  ADULT;  Surgeon: Enid Harry, MD;  Location: [facility];  Service: General;  Laterality: Right;   IR ANGIO INTRA EXTRACRAN SEL INTERNAL CAROTID BILAT MOD SED  05/14/2020   IR ANGIO VERTEBRAL SEL VERTEBRAL BILAT MOD SED  05/14/2020   IR GUIDED DRAIN W CATHETER PLACEMENT  02/05/2021   IR RADIOLOGIST EVAL & MGMT  02/13/2021   LOOP RECORDER INSERTION  06/2020   last remote check was on 03/12/21   MELANOMA EXCISION  07/28/2011   Dr Jerilynn Montenegro; stomach and chest   REMOVAL OF STONES  12/07/2018   Procedure: REMOVAL OF STONES;  Surgeon: Normie Becton., MD;  Location: [facility];  Service: Gastroenterology;;   TONSILLECTOMY  1951   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   UPPER GASTROINTESTINAL ENDOSCOPY  1983   hiatal hernia   VASECTOMY  1979   Patient Active Problem List   Diagnosis Date Noted   MCI (mild cognitive impairment) 03/09/2022   Thumb dislocation, right, initial encounter    Closed dislocation of interphalangeal joint of right thumb 04/03/2021   Dyslipidemia 05/23/2020   Benign essential HTN 05/06/2020   Subarachnoid hemorrhage (HCC) 04/30/2020   Leukopenia 07/15/2018   Thrombocytopenia (HCC) 07/15/2018   PCP NOTES >>>>>>> 03/23/2015   Annual physical exam 03/20/2015   Bronchospasm 09/06/2014   DJD (degenerative joint disease) 09/06/2014   Hypothyroidism 08/28/2014   Tremor 07/29/2014   Gait disorder 07/29/2014   Gout 01/08/2014   Vitamin D  deficiency 11/25/2012   Melanoma of skin (HCC) 11/25/2012   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HCC) 11/25/2012   Alcoholism in remission (HCC) 06/19/2011   Diverticulitis of colon (without mention of hemorrhage)(562.11) 06/19/2011   Bipolar I disorder (HCC) 06/19/2011   Osteopenia 05/17/2010   HYPERTRIGLYCERIDEMIA 08/02/2009   Macrocytic anemia 01/10/2009   ABNORMAL ELECTROCARDIOGRAM 11/26/2008   REFLUX, ESOPHAGEAL 11/17/2006   COMMON MIGRAINE 09/22/2006    ONSET DATE: 08/30/23 referral  REFERRING DIAG:  Z61.Benny Braver (ICD-10-CM) - Fall, initial encounter  R29.898 (ICD-10-CM) - Right leg weakness  I69.30 (ICD-10-CM) - Sequela, post-stroke    THERAPY DIAG:  Unsteadiness on feet  Muscle weakness (generalized)  Difficulty in walking, not elsewhere classified  Other lack of coordination  Rationale for Evaluation and Treatment: Rehabilitation  SUBJECTIVE:                                                                                                                                                                                             SUBJECTIVE STATEMENT: Patient reports  doing well. He denies recent falls or near falls.  Had to cancel appt yesterday due to family needs.  He received his foot up in the mail this morning, is adjustable strap vs plastic toe insert type.  He inquires if this matters. Pt accompanied by: self  PERTINENT HISTORY: Bipolar, GERD, Gout, migraines, HLD, hypothyroidism, osteopenia, melanoma, CVA, SAH  PAIN:  Are you having pain? No  PRECAUTIONS: Fall  PATIENT GOALS: "to get some strength back in my right leg"  OBJECTIVE:  Note: Objective measures were completed at Evaluation unless otherwise noted.  DIAGNOSTIC FINDINGS: 03/14/21 IMPRESSION: 1. Chronic bilateral subdural hygromas, left larger than right. 2. No acute intracranial abnormality.                                                                                                                              TREATMENT  -PT dons and explains donning of his foot-up brace.  Discussed adjustments for improved toe clearance as needed. -Pt ambulates x115' w/ initial setup of foot-up, needed tightening of laces and loop strap then ambulates x115' w/ adjustments reporting it feels more supportive and PT notes better toe clearance, discussed loosening loop if he notices limping or right knee flexion too quickly on that side  -Standing normal stance on airex progressing to unsupported:  trunk and head rotation x1 minute > eyes closed x 30 seconds > head nods x 45 seconds > eyes closed x30 seconds > midline cone taps x3 rounds (each until LOB or compensation) > offset cone taps alternating LE x2 rounds (each until LOB or compensation noted) -Foam beam progressing to no UE support: forward walking progressing to tandem over 10 reps x8 ft > lateral stepping 6x8 ft each direction, cued to improve LE clearance vs dragging LE along beam -Bouncing unweighted ball between pt and therapist x3 > alternating step out x12 -8lb slam ball alternating step out several reps each side, pt has difficulty  w/ coordination and intermittent lateral LOB -Wide stance 8lb slam balls x10 CGA  PATIENT EDUCATION: Education details: Continue HEP.  Fatigue impacts on foot drop/general imbalance.  Using reacher for floor pickups at home for safety. Person educated: Patient Education method: Explanation, Demonstration, and Handouts Education comprehension: verbalized understanding and needs further education  HOME EXERCISE PROGRAM: Access Code: [email or web address] URL: [email or web address] Date: 09/08/2023 Prepared by: Nickola Baron  Exercises - Sit to Stand with Counter Support  - 1 x daily - 7 x weekly - 3 sets - 10 reps - Corner Balance Feet Together With Eyes Closed  - 1 x daily - 7 x weekly - 3 sets - 30s hold - Corner Balance Feet Together: Eyes Open With Head Turns  - 1 x daily - 7 x weekly - 3 sets - 30s hold - Semi-Tandem Corner Balance With Eyes Open  - 1 x daily - 7 x weekly - 3 sets - 30s hold - Long Sitting Ankle Eversion with Resistance  - 1 x daily - 7 x weekly - 3 sets - 10 reps - Long Sitting Ankle Plantar Flexion with Resistance  -  1 x daily - 7 x weekly - 3 sets - 10 reps - Long Sitting Ankle Inversion with Resistance  - 1 x daily - 7 x weekly - 3 sets - 10 reps - Long Sitting Ankle Dorsiflexion with Anchored Resistance  - 1 x daily - 7 x weekly - 3 sets - 10 reps - Seated Ankle Alphabet  - 1 x daily - 7 x weekly - 3 sets  GOALS: Goals reviewed with patient? Yes  SHORT TERM GOALS: Target date: 10/01/23  Pt will be independent with initial HEP for improved functional strength and balance  Baseline: to be updated Goal status: INITIAL  2.  Pt will improve gait speed to >/= 1.85m/sto demonstrate improved community ambulation  Baseline: 1.88m/s Goal status: INITIAL  3.  Pt will improve FGA to >/= 20/30 to demonstrate improved balance and reduced fall risk  Baseline: 15/30 Goal status: INITIAL  4.  Patient will improve ABC score to >/=70% to demonstrate Improved  confidence in functional abilities  Baseline: 62.5% Goal status: INITIAL  5. Conditions 3 and 4 of MCTSIB to be assessed and LTG to be written  Baseline: Assessed 5/20  Goal status: MET   LONG TERM GOALS: Target date: 10/22/23  Pt will be independent with final HEP for improved functional strength and balance  Baseline: to be updated Goal status: INITIAL  2.  Patient will improve ABC score to >/=75% to demonstrate improved confidence in functional abilities  Baseline: 62.5% Goal status: INITIAL  3.  Pt will improve FGA to >/= 25/30 to demonstrate improved balance and reduced fall risk  Baseline: 15/30 Goal status: INITIAL  4.  Patient will demonstrate no sway during condition 4 of the mCTSIB in order to demonstrate improved balance strategies. Baseline: moderate sway, no LOB (5/20) Goal status: INITIAL   ASSESSMENT:  CLINICAL IMPRESSION: Emphasis of skilled PT on addressing imbalance and use of purchased foot-up brace option.  He has better right foot clearance with foot-up and was instructed on appropriate adjustments to maintain mechanics of gait as needed.  He was challenged by coordination appearing somewhat apraxic with alternating stepping tasks.  He continues to benefit from skilled PT to address functional weakness and reactive balance strategies.  Will continue per POC.   OBJECTIVE IMPAIRMENTS: Abnormal gait, decreased balance, difficulty walking, and decreased strength.   ACTIVITY LIMITATIONS: carrying, lifting, stairs, locomotion level, and caring for others  PARTICIPATION LIMITATIONS: interpersonal relationship and community activity  PERSONAL FACTORS: Age, Past/current experiences, and Time since onset of injury/illness/exacerbation are also affecting patient's functional outcome.   REHAB POTENTIAL: Fair time since onset  CLINICAL DECISION MAKING: Stable/uncomplicated  EVALUATION COMPLEXITY: Low  PLAN:  PT FREQUENCY: 2x/week  PT DURATION: 6  weeks  PLANNED INTERVENTIONS: 97164- PT Re-evaluation, 97750- Physical Performance Testing, 97110-Therapeutic exercises, 97530- Therapeutic activity, [id number]- Neuromuscular re-education, 97535- Self Care, 29562- Manual therapy, [phone]- Gait training, [phone]- Orthotic Initial, [phone]- Orthotic/Prosthetic subsequent, [phone]- Aquatic Therapy, [phone]- Electrical stimulation (manual), Patient/Family education, Balance training, Stair training, Taping, Dry Needling, Joint mobilization, Spinal mobilization, Vestibular training, Visual/preceptual remediation/compensation, Cognitive remediation, and DME instructions  PLAN FOR NEXT SESSION:  R LE functional strength and balance, balance recovery, floor reach from sitting and standing - blaze pods, sitting on physioball, cone reach, forward fold   Earlean Glaze, PT, DPT  09/22/2023, 10:12 AM

## 2023-09-22 NOTE — Telephone Encounter (Signed)
 Copied from CRM [id number]. Topic: Medicare AWV >> Sep 22, 2023 10:31 AM Juliana Ocean wrote: Reason for CRM: LVM 09/22/2023 to schedule AWV. Please schedule Virtual or Telehealth visits ONLY  Rosalee Collins; Care Guide Ambulatory Clinical Support [facility] Direct Dial: [phone]

## 2023-09-24 ENCOUNTER — Ambulatory Visit

## 2023-09-27 DIAGNOSIS — H401131 Primary open-angle glaucoma, bilateral, mild stage: Secondary | ICD-10-CM | POA: Diagnosis not present

## 2023-09-28 ENCOUNTER — Ambulatory Visit: Attending: Internal Medicine

## 2023-09-28 VITALS — BP 131/75 | HR 66

## 2023-09-28 DIAGNOSIS — M6281 Muscle weakness (generalized): Secondary | ICD-10-CM | POA: Insufficient documentation

## 2023-09-28 DIAGNOSIS — R2681 Unsteadiness on feet: Secondary | ICD-10-CM | POA: Insufficient documentation

## 2023-09-28 DIAGNOSIS — R262 Difficulty in walking, not elsewhere classified: Secondary | ICD-10-CM | POA: Diagnosis not present

## 2023-09-28 DIAGNOSIS — R278 Other lack of coordination: Secondary | ICD-10-CM | POA: Insufficient documentation

## 2023-09-28 NOTE — Therapy (Signed)
 OUTPATIENT PHYSICAL THERAPY NEURO TREATMENT   Patient Name: Jeffrey Acosta MRN: [id number] DOB:1950/09/29, 73 y.o., male Today's Date: 09/28/2023   PCP: Devonna Foley, MD REFERRING PROVIDER: Devonna Foley, MD  END OF SESSION:  PT End of Session - 09/28/23 1004     Visit Number 5    Number of Visits 13    Date for PT Re-Evaluation 10/22/23    Authorization Type BCBS Medicare    Progress Note Due on Visit 10    PT Start Time 1010    PT Stop Time 1055    PT Time Calculation (min) 45 min    Equipment Utilized During Treatment Gait belt    Activity Tolerance Patient tolerated treatment well    Behavior During Therapy WFL for tasks assessed/performed             Past Medical History:  Diagnosis Date   Alcoholism in remission (HCC)    in AA   Arrhythmia    atrial tachycardia   Bipolar affective disorder (HCC)    Depression    Diverticulosis of colon (without mention of hemorrhage)    GERD (gastroesophageal reflux disease)    Gout of multiple sites    Hiatal hernia    Hx of migraines    migraines- last one since 9-10 yrs ago   Hydrocele    HYPERLIPIDEMIA 01/10/2009   Qualifier: Diagnosis of  By: Rochelle Chu CMA, Chemira    NMR Lipoprofile 2011 LDL could not be calculated  Due to TG of 889  (811  / 750 ),  HDL 50 . Father MI @ 15 PGM CVA early 81s; PGF CVA @72     Hypothyroidism    Melanoma of skin, site unspecified 11/25/2012   07/2011 abdominal  Stage 1 Dr Amy [place] Dr Jerilynn Montenegro and chest   Osteopenia    Polyarthralgia    Status post placement of implantable loop recorder 08/26/2020   Stroke (HCC)    x 3 - last one 04/2020   Subarachnoid hemorrhage [facility])    Past Surgical History:  Procedure Laterality Date   BILIARY BRUSHING  12/07/2018   Procedure: BILIARY BRUSHING;  Surgeon: Normie Becton., MD;  Location: [facility];  Service: Gastroenterology;;   BILIARY DILATION  12/07/2018   Procedure: BILIARY DILATION;  Surgeon: Normie Becton., MD;  Location: [facility];  Service: Gastroenterology;;   BIOPSY  12/07/2018   Procedure: BIOPSY;  Surgeon: Normie Becton., MD;  Location: [facility];  Service: Gastroenterology;;   CHOLECYSTECTOMY N/A 01/29/2021   Procedure: LAPAROSCOPIC CHOLECYSTECTOMY WITH ICG DYE;  Surgeon: Enid Harry, MD;  Location: [facility] OR;  Service: General;  Laterality: N/A;   CLOSED REDUCTION FINGER WITH PERCUTANEOUS PINNING Right 04/04/2021   Procedure: CLOSED REDUCTION WITH PERCUTANEOUS PINNING RIGHT THUMB;  Surgeon: Marilyn Shropshire, MD;  Location: [facility] OR;  Service: Orthopedics;  Laterality: Right;   COLONOSCOPY  2003   negative ; Dr Adan Holms   ERCP N/A 12/07/2018   Procedure: ENDOSCOPIC RETROGRADE CHOLANGIOPANCREATOGRAPHY (ERCP);  Surgeon: Normie Becton., MD;  Location: [facility];  Service: Gastroenterology;  Laterality: N/A;   ESOPHAGOGASTRODUODENOSCOPY (EGD) WITH PROPOFOL  N/A 12/07/2018   Procedure: ESOPHAGOGASTRODUODENOSCOPY (EGD) WITH PROPOFOL ;  Surgeon: Brice Campi Albino Alu., MD;  Location: [facility];  Service: Gastroenterology;  Laterality: N/A;   EUS N/A 12/07/2018   Procedure: UPPER ENDOSCOPIC ULTRASOUND (EUS) RADIAL;  Surgeon: Normie Becton., MD;  Location: [facility];  Service: Gastroenterology;  Laterality: N/A;   INGUINAL HERNIA REPAIR  08/07/2011   Procedure: HERNIA REPAIR INGUINAL  ADULT;  Surgeon: Enid Harry, MD;  Location: [facility];  Service: General;  Laterality: Right;   IR ANGIO INTRA EXTRACRAN SEL INTERNAL CAROTID BILAT MOD SED  05/14/2020   IR ANGIO VERTEBRAL SEL VERTEBRAL BILAT MOD SED  05/14/2020   IR GUIDED DRAIN W CATHETER PLACEMENT  02/05/2021   IR RADIOLOGIST EVAL & MGMT  02/13/2021   LOOP RECORDER INSERTION  06/2020   last remote check was on 03/12/21   MELANOMA EXCISION  07/28/2011   Dr Jerilynn Montenegro; stomach and chest   REMOVAL OF STONES  12/07/2018   Procedure: REMOVAL OF STONES;  Surgeon: Normie Becton., MD;  Location: [facility];  Service: Gastroenterology;;   TONSILLECTOMY  1951   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   UPPER GASTROINTESTINAL ENDOSCOPY  1983   hiatal hernia   VASECTOMY  1979   Patient Active Problem List   Diagnosis Date Noted   MCI (mild cognitive impairment) 03/09/2022   Thumb dislocation, right, initial encounter    Closed dislocation of interphalangeal joint of right thumb 04/03/2021   Dyslipidemia 05/23/2020   Benign essential HTN 05/06/2020   Subarachnoid hemorrhage (HCC) 04/30/2020   Leukopenia 07/15/2018   Thrombocytopenia (HCC) 07/15/2018   PCP NOTES >>>>>>> 03/23/2015   Annual physical exam 03/20/2015   Bronchospasm 09/06/2014   DJD (degenerative joint disease) 09/06/2014   Hypothyroidism 08/28/2014   Tremor 07/29/2014   Gait disorder 07/29/2014   Gout 01/08/2014   Vitamin D  deficiency 11/25/2012   Melanoma of skin (HCC) 11/25/2012   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HCC) 11/25/2012   Alcoholism in remission (HCC) 06/19/2011   Diverticulitis of colon (without mention of hemorrhage)(562.11) 06/19/2011   Bipolar I disorder (HCC) 06/19/2011   Osteopenia 05/17/2010   HYPERTRIGLYCERIDEMIA 08/02/2009   Macrocytic anemia 01/10/2009   ABNORMAL ELECTROCARDIOGRAM 11/26/2008   REFLUX, ESOPHAGEAL 11/17/2006   COMMON MIGRAINE 09/22/2006    ONSET DATE: 08/30/23 referral  REFERRING DIAG:  R60.Benny Braver (ICD-10-CM) - Fall, initial encounter  R29.898 (ICD-10-CM) - Right leg weakness  I69.30 (ICD-10-CM) - Sequela, post-stroke    THERAPY DIAG:  Unsteadiness on feet  Muscle weakness (generalized)  Difficulty in walking, not elsewhere classified  Other lack of coordination  Rationale for Evaluation and Treatment: Rehabilitation  SUBJECTIVE:                                                                                                                                                                                             SUBJECTIVE STATEMENT: Patient reports  doing well. Went to [employer] over the weekend. Wearing the foot up brace every day. Denies falls.  Pt accompanied by: self  PERTINENT HISTORY: Bipolar, GERD, Gout, migraines, HLD, hypothyroidism, osteopenia, melanoma, CVA, SAH  PAIN:  Are you having pain? No  PRECAUTIONS: Fall  PATIENT GOALS: "to get some strength back in my right leg"  OBJECTIVE:  Note: Objective measures were completed at Evaluation unless otherwise noted.  DIAGNOSTIC FINDINGS: 03/14/21 IMPRESSION: 1. Chronic bilateral subdural hygromas, left larger than right. 2. No acute intracranial abnormality.                                                                                                                              TREATMENT  NMR -standing on foam pulling squigz off mirror naming foods in alpha order  -required Max cuing for cog task  -standing on foam hanging squigz on mirror naming animals   -less assist required -ball toss to self 115' -construct ring toss based on written instructions + play standing on foam and retrieve from ground -resisted anterior/retro gait with CGA provided  PATIENT EDUCATION: Education details: Continue HEP, minimize dual tasking esp motor + cog Person educated: Patient Education method: Explanation, Demonstration, and Handouts Education comprehension: verbalized understanding and needs further education  HOME EXERCISE PROGRAM: Access Code: [email or web address] URL: [email or web address] Date: 09/08/2023 Prepared by: Nickola Baron  Exercises - Sit to Stand with Counter Support  - 1 x daily - 7 x weekly - 3 sets - 10 reps - Corner Balance Feet Together With Eyes Closed  - 1 x daily - 7 x weekly - 3 sets - 30s hold - Corner Balance Feet Together: Eyes Open With Head Turns  - 1 x daily - 7 x weekly - 3 sets - 30s hold - Semi-Tandem Corner Balance With Eyes Open  - 1 x daily - 7 x weekly - 3 sets - 30s hold - Long Sitting Ankle Eversion with Resistance  - 1 x  daily - 7 x weekly - 3 sets - 10 reps - Long Sitting Ankle Plantar Flexion with Resistance  - 1 x daily - 7 x weekly - 3 sets - 10 reps - Long Sitting Ankle Inversion with Resistance  - 1 x daily - 7 x weekly - 3 sets - 10 reps - Long Sitting Ankle Dorsiflexion with Anchored Resistance  - 1 x daily - 7 x weekly - 3 sets - 10 reps - Seated Ankle Alphabet  - 1 x daily - 7 x weekly - 3 sets  GOALS: Goals reviewed with patient? Yes  SHORT TERM GOALS: Target date: 10/01/23  Pt will be independent with initial HEP for improved functional strength and balance  Baseline: to be updated Goal status: INITIAL  2.  Pt will improve gait speed to >/= 1.42m/sto demonstrate improved community ambulation  Baseline: 1.49m/s Goal status: INITIAL  3.  Pt will improve FGA to >/= 20/30 to demonstrate improved balance and reduced fall risk  Baseline: 15/30 Goal status: INITIAL  4.  Patient will improve ABC score to >/=70% to  demonstrate Improved confidence in functional abilities  Baseline: 62.5% Goal status: INITIAL  5. Conditions 3 and 4 of MCTSIB to be assessed and LTG to be written  Baseline: Assessed 5/20  Goal status: MET   LONG TERM GOALS: Target date: 10/22/23  Pt will be independent with final HEP for improved functional strength and balance  Baseline: to be updated Goal status: INITIAL  2.  Patient will improve ABC score to >/=75% to demonstrate improved confidence in functional abilities  Baseline: 62.5% Goal status: INITIAL  3.  Pt will improve FGA to >/= 25/30 to demonstrate improved balance and reduced fall risk  Baseline: 15/30 Goal status: INITIAL  4.  Patient will demonstrate no sway during condition 4 of the mCTSIB in order to demonstrate improved balance strategies. Baseline: moderate sway, no LOB (5/20) Goal status: INITIAL   ASSESSMENT:  CLINICAL IMPRESSION: Patient seen for skilled PT session with emphasis on gross NMR. R foot up brace donned throughout session. He  demonstrates significant difficulties with dual tasking, especially a motor task with cog overlay. PT discussed functional implications with patient and he verbalized understanding. Continue POC.   OBJECTIVE IMPAIRMENTS: Abnormal gait, decreased balance, difficulty walking, and decreased strength.   ACTIVITY LIMITATIONS: carrying, lifting, stairs, locomotion level, and caring for others  PARTICIPATION LIMITATIONS: interpersonal relationship and community activity  PERSONAL FACTORS: Age, Past/current experiences, and Time since onset of injury/illness/exacerbation are also affecting patient's functional outcome.   REHAB POTENTIAL: Fair time since onset  CLINICAL DECISION MAKING: Stable/uncomplicated  EVALUATION COMPLEXITY: Low  PLAN:  PT FREQUENCY: 2x/week  PT DURATION: 6 weeks  PLANNED INTERVENTIONS: 97164- PT Re-evaluation, 97750- Physical Performance Testing, 97110-Therapeutic exercises, 97530- Therapeutic activity, [id number]- Neuromuscular re-education, 97535- Self Care, 82956- Manual therapy, [phone]- Gait training, [phone]- Orthotic Initial, [phone]- Orthotic/Prosthetic subsequent, [phone]- Aquatic Therapy, [phone]- Electrical stimulation (manual), Patient/Family education, Balance training, Stair training, Taping, Dry Needling, Joint mobilization, Spinal mobilization, Vestibular training, Visual/preceptual remediation/compensation, Cognitive remediation, and DME instructions  PLAN FOR NEXT SESSION:  R LE functional strength and balance, balance recovery, floor reach from sitting and standing - blaze pods, sitting on physioball, cone reach, forward fold, CHECK STG, dual tasking    Rebecca Campus, PT Rebecca Campus, PT, DPT, CBIS  09/28/2023, 11:12 AM

## 2023-09-30 ENCOUNTER — Ambulatory Visit

## 2023-09-30 ENCOUNTER — Ambulatory Visit (INDEPENDENT_AMBULATORY_CARE_PROVIDER_SITE_OTHER)

## 2023-09-30 VITALS — BP 157/90 | HR 58

## 2023-09-30 DIAGNOSIS — R2681 Unsteadiness on feet: Secondary | ICD-10-CM

## 2023-09-30 DIAGNOSIS — R278 Other lack of coordination: Secondary | ICD-10-CM | POA: Diagnosis not present

## 2023-09-30 DIAGNOSIS — I639 Cerebral infarction, unspecified: Secondary | ICD-10-CM | POA: Diagnosis not present

## 2023-09-30 DIAGNOSIS — R262 Difficulty in walking, not elsewhere classified: Secondary | ICD-10-CM

## 2023-09-30 DIAGNOSIS — M6281 Muscle weakness (generalized): Secondary | ICD-10-CM

## 2023-09-30 LAB — CUP PACEART REMOTE DEVICE CHECK
Date Time Interrogation Session: 20250604230849
Implantable Pulse Generator Implant Date: 20220502

## 2023-09-30 NOTE — Therapy (Signed)
 OUTPATIENT PHYSICAL THERAPY NEURO TREATMENT   Patient Name: LACOREY BRUSCA MRN: [id number] DOB:[birth year], 73 y.o., male Today's Date: 09/30/2023   PCP: Devonna Foley, MD REFERRING PROVIDER: Devonna Foley, MD  END OF SESSION:  PT End of Session - 09/30/23 1006     Visit Number 6    Number of Visits 13    Date for PT Re-Evaluation 10/22/23    Authorization Type [employer] Medicare    Progress Note Due on Visit 10    PT Start Time 1010    PT Stop Time 1054    PT Time Calculation (min) 44 min    Equipment Utilized During Treatment Gait belt    Activity Tolerance Patient tolerated treatment well    Behavior During Therapy WFL for tasks assessed/performed             Past Medical History:  Diagnosis Date   Alcoholism in remission (HCC)    in AA   Arrhythmia    atrial tachycardia   Bipolar affective disorder (HCC)    Depression    Diverticulosis of colon (without mention of hemorrhage)    GERD (gastroesophageal reflux disease)    Gout of multiple sites    Hiatal hernia    Hx of migraines    migraines- last one since 9-10 yrs ago   Hydrocele    HYPERLIPIDEMIA 01/10/2009   Qualifier: Diagnosis of  By: Rochelle Chu CMA, Chemira    NMR Lipoprofile 2011 LDL could not be calculated  Due to TG of 889  (811  / 750 ),  HDL 50 . Father MI @ 78 PGM CVA early 47s; PGF CVA @72     Hypothyroidism    Melanoma of skin, site unspecified 11/25/2012   07/2011 abdominal  Stage 1 Dr Amy [place] Dr Jerilynn Montenegro and chest   Osteopenia    Polyarthralgia    Status post placement of implantable loop recorder 08/26/2020   Stroke (HCC)    x 3 - last one 04/2020   Subarachnoid hemorrhage [facility])    Past Surgical History:  Procedure Laterality Date   BILIARY BRUSHING  12/07/2018   Procedure: BILIARY BRUSHING;  Surgeon: Normie Becton., MD;  Location: [facility];  Service: Gastroenterology;;   BILIARY DILATION  12/07/2018   Procedure: BILIARY DILATION;  Surgeon: Normie Becton., MD;  Location: [facility];  Service: Gastroenterology;;   BIOPSY  12/07/2018   Procedure: BIOPSY;  Surgeon: Normie Becton., MD;  Location: [facility];  Service: Gastroenterology;;   CHOLECYSTECTOMY N/A 01/29/2021   Procedure: LAPAROSCOPIC CHOLECYSTECTOMY WITH ICG DYE;  Surgeon: Enid Harry, MD;  Location: [facility] OR;  Service: General;  Laterality: N/A;   CLOSED REDUCTION FINGER WITH PERCUTANEOUS PINNING Right 04/04/2021   Procedure: CLOSED REDUCTION WITH PERCUTANEOUS PINNING RIGHT THUMB;  Surgeon: Marilyn Shropshire, MD;  Location: [facility] OR;  Service: Orthopedics;  Laterality: Right;   COLONOSCOPY  2003   negative ; Dr Adan Holms   ERCP N/A 12/07/2018   Procedure: ENDOSCOPIC RETROGRADE CHOLANGIOPANCREATOGRAPHY (ERCP);  Surgeon: Normie Becton., MD;  Location: [facility];  Service: Gastroenterology;  Laterality: N/A;   ESOPHAGOGASTRODUODENOSCOPY (EGD) WITH PROPOFOL  N/A 12/07/2018   Procedure: ESOPHAGOGASTRODUODENOSCOPY (EGD) WITH PROPOFOL ;  Surgeon: Brice Campi Albino Alu., MD;  Location: [facility];  Service: Gastroenterology;  Laterality: N/A;   EUS N/A 12/07/2018   Procedure: UPPER ENDOSCOPIC ULTRASOUND (EUS) RADIAL;  Surgeon: Normie Becton., MD;  Location: [facility];  Service: Gastroenterology;  Laterality: N/A;   INGUINAL HERNIA REPAIR  08/07/2011   Procedure: HERNIA REPAIR INGUINAL  ADULT;  Surgeon: Enid Harry, MD;  Location: [facility];  Service: General;  Laterality: Right;   IR ANGIO INTRA EXTRACRAN SEL INTERNAL CAROTID BILAT MOD SED  05/14/2020   IR ANGIO VERTEBRAL SEL VERTEBRAL BILAT MOD SED  05/14/2020   IR GUIDED DRAIN W CATHETER PLACEMENT  02/05/2021   IR RADIOLOGIST EVAL & MGMT  02/13/2021   LOOP RECORDER INSERTION  06/2020   last remote check was on 03/12/21   MELANOMA EXCISION  07/28/2011   Dr Jerilynn Montenegro; stomach and chest   REMOVAL OF STONES  12/07/2018   Procedure: REMOVAL OF STONES;  Surgeon: Normie Becton., MD;  Location: [facility];  Service: Gastroenterology;;   TONSILLECTOMY  1951   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   UPPER GASTROINTESTINAL ENDOSCOPY  1983   hiatal hernia   VASECTOMY  1979   Patient Active Problem List   Diagnosis Date Noted   MCI (mild cognitive impairment) 03/09/2022   Thumb dislocation, right, initial encounter    Closed dislocation of interphalangeal joint of right thumb 04/03/2021   Dyslipidemia 05/23/2020   Benign essential HTN 05/06/2020   Subarachnoid hemorrhage (HCC) 04/30/2020   Leukopenia 07/15/2018   Thrombocytopenia (HCC) 07/15/2018   PCP NOTES >>>>>>> 03/23/2015   Annual physical exam 03/20/2015   Bronchospasm 09/06/2014   DJD (degenerative joint disease) 09/06/2014   Hypothyroidism 08/28/2014   Tremor 07/29/2014   Gait disorder 07/29/2014   Gout 01/08/2014   Vitamin D  deficiency 11/25/2012   Melanoma of skin (HCC) 11/25/2012   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HCC) 11/25/2012   Alcoholism in remission (HCC) 06/19/2011   Diverticulitis of colon (without mention of hemorrhage)(562.11) 06/19/2011   Bipolar I disorder (HCC) 06/19/2011   Osteopenia 05/17/2010   HYPERTRIGLYCERIDEMIA 08/02/2009   Macrocytic anemia 01/10/2009   ABNORMAL ELECTROCARDIOGRAM 11/26/2008   REFLUX, ESOPHAGEAL 11/17/2006   COMMON MIGRAINE 09/22/2006    ONSET DATE: 08/30/23 referral  REFERRING DIAG:  Z61.Benny Braver (ICD-10-CM) - Fall, initial encounter  R29.898 (ICD-10-CM) - Right leg weakness  I69.30 (ICD-10-CM) - [place], post-stroke    THERAPY DIAG:  Unsteadiness on feet  Muscle weakness (generalized)  Difficulty in walking, not elsewhere classified  Other lack of coordination  Rationale for Evaluation and Treatment: Rehabilitation  SUBJECTIVE:                                                                                                                                                                                             SUBJECTIVE STATEMENT: Patient reports  doing well. Not wearing foot up brace for STG assessment. Denies falls.  Pt accompanied by: self  PERTINENT  HISTORY: Bipolar, GERD, Gout, migraines, HLD, hypothyroidism, osteopenia, melanoma, CVA, SAH  PAIN:  Are you having pain? No  PRECAUTIONS: Fall  PATIENT GOALS: "to get some strength back in my right leg"  OBJECTIVE:  Note: Objective measures were completed at Evaluation unless otherwise noted.  DIAGNOSTIC FINDINGS: 03/14/21 IMPRESSION: 1. Chronic bilateral subdural hygromas, left larger than right. 2. No acute intracranial abnormality.                                                                                                                              TREATMENT  Theract: -ABC questionnaire: 60%   [facility] PT Assessment - 09/30/23 0001       Standardized Balance Assessment   10 Meter Walk 1.16m/s      Functional Gait  Assessment   Gait assessed  Yes    Gait Level Surface Walks 20 ft in less than 5.5 sec, no assistive devices, good speed, no evidence for imbalance, normal gait pattern, deviates no more than 6 in outside of the 12 in walkway width.    Change in Gait Speed Able to smoothly change walking speed without loss of balance or gait deviation. Deviate no more than 6 in outside of the 12 in walkway width.    Gait with Horizontal Head Turns Performs head turns smoothly with slight change in gait velocity (eg, minor disruption to smooth gait path), deviates 6-10 in outside 12 in walkway width, or uses an assistive device.    Gait with Vertical Head Turns Performs task with slight change in gait velocity (eg, minor disruption to smooth gait path), deviates 6 - 10 in outside 12 in walkway width or uses assistive device    Gait and Pivot Turn Pivot turns safely in greater than 3 sec and stops with no loss of balance, or pivot turns safely within 3 sec and stops with mild imbalance, requires small steps to catch balance.    Step Over Obstacle Is able to step over one shoe  box (4.5 in total height) without changing gait speed. No evidence of imbalance.    Gait with Narrow Base of Support Ambulates 4-7 steps.    Gait with Eyes Closed Walks 20 ft, slow speed, abnormal gait pattern, evidence for imbalance, deviates 10-15 in outside 12 in walkway width. Requires more than 9 sec to ambulate 20 ft.    Ambulating Backwards Walks 20 ft, uses assistive device, slower speed, mild gait deviations, deviates 6-10 in outside 12 in walkway width.    Steps Alternating feet, must use rail.    Total Score 20            NMR: -beanbag on R toebox anterior/posterior step over 4" hurdle   -initially using R UE support on chair, progressed to CGA  -5# ankle weight added  -seated R ankle AROM on wobble board coordination    PATIENT EDUCATION: Education details: exam findings, continue HEP, PT POC Person educated: Patient Education method:  Explanation, Demonstration, and Handouts Education comprehension: verbalized understanding and needs further education  HOME EXERCISE PROGRAM: Access Code: [email or web address] URL: [email or web address] Date: 09/08/2023 Prepared by: Nickola Baron  Exercises - Sit to Stand with Counter Support  - 1 x daily - 7 x weekly - 3 sets - 10 reps - Corner Balance Feet Together With Eyes Closed  - 1 x daily - 7 x weekly - 3 sets - 30s hold - Corner Balance Feet Together: Eyes Open With Head Turns  - 1 x daily - 7 x weekly - 3 sets - 30s hold - Semi-Tandem Corner Balance With Eyes Open  - 1 x daily - 7 x weekly - 3 sets - 30s hold - Long Sitting Ankle Eversion with Resistance  - 1 x daily - 7 x weekly - 3 sets - 10 reps - Long Sitting Ankle Plantar Flexion with Resistance  - 1 x daily - 7 x weekly - 3 sets - 10 reps - Long Sitting Ankle Inversion with Resistance  - 1 x daily - 7 x weekly - 3 sets - 10 reps - Long Sitting Ankle Dorsiflexion with Anchored Resistance  - 1 x daily - 7 x weekly - 3 sets - 10 reps - Seated Ankle Alphabet  - 1 x  daily - 7 x weekly - 3 sets  GOALS: Goals reviewed with patient? Yes  SHORT TERM GOALS: Target date: 10/01/23  Pt will be independent with initial HEP for improved functional strength and balance  Baseline: to be updated; provided Goal status: MET  2.  Pt will improve gait speed to >/= 1.67m/s to demonstrate improved community ambulation  Baseline: 1.43m/s; 1.18m/s Goal status: NOT MET  3.  Pt will improve FGA to >/= 20/30 to demonstrate improved balance and reduced fall risk  Baseline: 15/30; 20/30 Goal status: MET  4.  Patient will improve ABC score to >/=70% to demonstrate Improved confidence in functional abilities  Baseline: 62.5%; 60% Goal status: NOT MET  5. Conditions 3 and 4 of MCTSIB to be assessed and LTG to be written  Baseline: Assessed 5/20  Goal status: MET   LONG TERM GOALS: Target date: 10/22/23  Pt will be independent with final HEP for improved functional strength and balance  Baseline: to be updated Goal status: INITIAL  2.  Patient will improve ABC score to >/=75% to demonstrate improved confidence in functional abilities  Baseline: 62.5% Goal status: INITIAL  3.  Pt will improve FGA to >/= 25/30 to demonstrate improved balance and reduced fall risk  Baseline: 15/30 Goal status: INITIAL  4.  Patient will demonstrate no sway during condition 4 of the mCTSIB in order to demonstrate improved balance strategies. Baseline: moderate sway, no LOB (5/20) Goal status: INITIAL   ASSESSMENT:  CLINICAL IMPRESSION: Patient seen for skilled PT session with emphasis on gross NMR and STG assessment. He met 3/5 STG. Activities-specific Balance Confidence Scale:  Score: 60 Increased risk of falls in community-dwelling, older adults <80% (79.89%)  0% = no confidence - 100% = complete confidence (ANPTA Core Set of Outcome Measures for Adults with Neurologic Conditions, 2018). Patient demonstrates increased fall risk as noted by score of 20/30 on  Functional Gait  Assessment.   <22/30 = predictive of falls, <20/30 = fall in 6 months, <18/30 = predictive of falls in PD MCID: 5 points stroke population, 4 points geriatric population (ANPTA Core Set of Outcome Measures for Adults with Neurologic Conditions, 2018). 10 Meter Walk Test: Patient instructed to walk 10  meters (32.8 ft) as quickly and as safely as possible at their normal speed x2 and at a fast speed x2. Time measured from 2 meter mark to 8 meter mark to accommodate ramp-up and ramp-down.  Normal speed: 1.74m/s Cut off scores: <0.4 m/s = household Ambulator, 0.4-0.8 m/s = limited community Ambulator, >0.8 m/s = community Ambulator, >1.2 m/s = crossing a street, <1.0 = increased fall risk MCID 0.05 m/s (small), 0.13 m/s (moderate), 0.06 m/s (significant)  (ANPTA Core Set of Outcome Measures for Adults with Neurologic Conditions, 2018). Continue POC.    OBJECTIVE IMPAIRMENTS: Abnormal gait, decreased balance, difficulty walking, and decreased strength.   ACTIVITY LIMITATIONS: carrying, lifting, stairs, locomotion level, and caring for others  PARTICIPATION LIMITATIONS: interpersonal relationship and community activity  PERSONAL FACTORS: Age, Past/current experiences, and Time since onset of injury/illness/exacerbation are also affecting patient's functional outcome.   REHAB POTENTIAL: Fair time since onset  CLINICAL DECISION MAKING: Stable/uncomplicated  EVALUATION COMPLEXITY: Low  PLAN:  PT FREQUENCY: 2x/week  PT DURATION: 6 weeks  PLANNED INTERVENTIONS: 97164- PT Re-evaluation, 97750- Physical Performance Testing, 97110-Therapeutic exercises, 97530- Therapeutic activity, [id number]- Neuromuscular re-education, 97535- Self Care, [id number]- Manual therapy, [phone]- Gait training, [phone]- Orthotic Initial, [phone]- Orthotic/Prosthetic subsequent, [phone]- Aquatic Therapy, [phone]- Electrical stimulation (manual), Patient/Family education, Balance training, Stair training, Taping, Dry Needling, Joint  mobilization, Spinal mobilization, Vestibular training, Visual/preceptual remediation/compensation, Cognitive remediation, and DME instructions  PLAN FOR NEXT SESSION:  R LE functional strength and balance, balance recovery, floor reach from sitting and standing - blaze pods, sitting on physioball, cone reach, forward fold,  dual tasking    Rebecca Campus, PT Rebecca Campus, PT, DPT, CBIS  09/30/2023, 10:56 AM

## 2023-10-01 ENCOUNTER — Ambulatory Visit: Payer: Self-pay | Admitting: Cardiovascular Disease

## 2023-10-04 ENCOUNTER — Ambulatory Visit

## 2023-10-05 ENCOUNTER — Ambulatory Visit: Admitting: Physical Therapy

## 2023-10-05 ENCOUNTER — Encounter: Payer: Self-pay | Admitting: Physical Therapy

## 2023-10-05 VITALS — BP 125/75 | HR 58

## 2023-10-05 DIAGNOSIS — R262 Difficulty in walking, not elsewhere classified: Secondary | ICD-10-CM | POA: Diagnosis not present

## 2023-10-05 DIAGNOSIS — R278 Other lack of coordination: Secondary | ICD-10-CM | POA: Diagnosis not present

## 2023-10-05 DIAGNOSIS — R2681 Unsteadiness on feet: Secondary | ICD-10-CM

## 2023-10-05 DIAGNOSIS — M6281 Muscle weakness (generalized): Secondary | ICD-10-CM

## 2023-10-05 NOTE — Therapy (Signed)
 OUTPATIENT PHYSICAL THERAPY NEURO TREATMENT   Patient Name: Jeffrey Acosta MRN: [id number] DOB:April 23, 1951, 73 y.o., male Today's Date: 10/05/2023   PCP: Devonna Foley, MD REFERRING PROVIDER: Devonna Foley, MD  END OF SESSION:  PT End of Session - 10/05/23 0930     Visit Number 7    Number of Visits 13    Date for PT Re-Evaluation 10/22/23    Authorization Type BCBS Medicare    Progress Note Due on Visit 10    PT Start Time 0930    PT Stop Time 1015    PT Time Calculation (min) 45 min    Equipment Utilized During Treatment Gait belt    Activity Tolerance Patient tolerated treatment well    Behavior During Therapy WFL for tasks assessed/performed             Past Medical History:  Diagnosis Date   Alcoholism in remission (HCC)    in AA   Arrhythmia    atrial tachycardia   Bipolar affective disorder (HCC)    Depression    Diverticulosis of colon (without mention of hemorrhage)    GERD (gastroesophageal reflux disease)    Gout of multiple sites    Hiatal hernia    Hx of migraines    migraines- last one since 9-10 yrs ago   Hydrocele    HYPERLIPIDEMIA 01/10/2009   Qualifier: Diagnosis of  By: Rochelle Chu CMA, Chemira    NMR Lipoprofile 2011 LDL could not be calculated  Due to TG of 889  (811  / 750 ),  HDL 50 . Father MI @ 27 PGM CVA early 42s; PGF CVA @72     Hypothyroidism    Melanoma of skin, site unspecified 11/25/2012   07/2011 abdominal  Stage 1 Dr Amy [place] Dr Jerilynn Montenegro and chest   Osteopenia    Polyarthralgia    Status post placement of implantable loop recorder 08/26/2020   Stroke (HCC)    x 3 - last one 04/2020   Subarachnoid hemorrhage [facility])    Past Surgical History:  Procedure Laterality Date   BILIARY BRUSHING  12/07/2018   Procedure: BILIARY BRUSHING;  Surgeon: Normie Becton., MD;  Location: [facility];  Service: Gastroenterology;;   BILIARY DILATION  12/07/2018   Procedure: BILIARY DILATION;  Surgeon: Normie Becton., MD;  Location: [facility];  Service: Gastroenterology;;   BIOPSY  12/07/2018   Procedure: BIOPSY;  Surgeon: Normie Becton., MD;  Location: [facility];  Service: Gastroenterology;;   CHOLECYSTECTOMY N/A 01/29/2021   Procedure: LAPAROSCOPIC CHOLECYSTECTOMY WITH ICG DYE;  Surgeon: Enid Harry, MD;  Location: [facility] OR;  Service: General;  Laterality: N/A;   CLOSED REDUCTION FINGER WITH PERCUTANEOUS PINNING Right 04/04/2021   Procedure: CLOSED REDUCTION WITH PERCUTANEOUS PINNING RIGHT THUMB;  Surgeon: Marilyn Shropshire, MD;  Location: [facility] OR;  Service: Orthopedics;  Laterality: Right;   COLONOSCOPY  2003   negative ; Dr Adan Holms   ERCP N/A 12/07/2018   Procedure: ENDOSCOPIC RETROGRADE CHOLANGIOPANCREATOGRAPHY (ERCP);  Surgeon: Normie Becton., MD;  Location: [facility];  Service: Gastroenterology;  Laterality: N/A;   ESOPHAGOGASTRODUODENOSCOPY (EGD) WITH PROPOFOL  N/A 12/07/2018   Procedure: ESOPHAGOGASTRODUODENOSCOPY (EGD) WITH PROPOFOL ;  Surgeon: Brice Campi Albino Alu., MD;  Location: [facility];  Service: Gastroenterology;  Laterality: N/A;   EUS N/A 12/07/2018   Procedure: UPPER ENDOSCOPIC ULTRASOUND (EUS) RADIAL;  Surgeon: Normie Becton., MD;  Location: [facility];  Service: Gastroenterology;  Laterality: N/A;   INGUINAL HERNIA REPAIR  08/07/2011   Procedure: HERNIA REPAIR INGUINAL  ADULT;  Surgeon: Enid Harry, MD;  Location: [facility];  Service: General;  Laterality: Right;   IR ANGIO INTRA EXTRACRAN SEL INTERNAL CAROTID BILAT MOD SED  05/14/2020   IR ANGIO VERTEBRAL SEL VERTEBRAL BILAT MOD SED  05/14/2020   IR GUIDED DRAIN W CATHETER PLACEMENT  02/05/2021   IR RADIOLOGIST EVAL & MGMT  02/13/2021   LOOP RECORDER INSERTION  06/2020   last remote check was on 03/12/21   MELANOMA EXCISION  07/28/2011   Dr Jerilynn Montenegro; stomach and chest   REMOVAL OF STONES  12/07/2018   Procedure: REMOVAL OF STONES;  Surgeon: Normie Becton., MD;  Location: [facility];  Service: Gastroenterology;;   TONSILLECTOMY  1951   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   UPPER GASTROINTESTINAL ENDOSCOPY  1983   hiatal hernia   VASECTOMY  1979   Patient Active Problem List   Diagnosis Date Noted   MCI (mild cognitive impairment) 03/09/2022   Thumb dislocation, right, initial encounter    Closed dislocation of interphalangeal joint of right thumb 04/03/2021   Dyslipidemia 05/23/2020   Benign essential HTN 05/06/2020   Subarachnoid hemorrhage (HCC) 04/30/2020   Leukopenia 07/15/2018   Thrombocytopenia (HCC) 07/15/2018   PCP NOTES >>>>>>> 03/23/2015   Annual physical exam 03/20/2015   Bronchospasm 09/06/2014   DJD (degenerative joint disease) 09/06/2014   Hypothyroidism 08/28/2014   Tremor 07/29/2014   Gait disorder 07/29/2014   Gout 01/08/2014   Vitamin D  deficiency 11/25/2012   Melanoma of skin (HCC) 11/25/2012   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HCC) 11/25/2012   Alcoholism in remission (HCC) 06/19/2011   Diverticulitis of colon (without mention of hemorrhage)(562.11) 06/19/2011   Bipolar I disorder (HCC) 06/19/2011   Osteopenia 05/17/2010   HYPERTRIGLYCERIDEMIA 08/02/2009   Macrocytic anemia 01/10/2009   ABNORMAL ELECTROCARDIOGRAM 11/26/2008   REFLUX, ESOPHAGEAL 11/17/2006   COMMON MIGRAINE 09/22/2006    ONSET DATE: 08/30/23 referral  REFERRING DIAG:  V78.Benny Braver (ICD-10-CM) - Fall, initial encounter  R29.898 (ICD-10-CM) - Right leg weakness  I69.30 (ICD-10-CM) - Sequela, post-stroke    THERAPY DIAG:  Unsteadiness on feet  Muscle weakness (generalized)  Difficulty in walking, not elsewhere classified  Other lack of coordination  Rationale for Evaluation and Treatment: Rehabilitation  SUBJECTIVE:                                                                                                                                                                                             SUBJECTIVE STATEMENT: Patient reports  doing well. Not wearing foot up brace as he was working on exercises this morning and does not wear  it during that. Denies falls.  He requests BP assessment as it has been running high lately though he is unsure why. Pt accompanied by: self  PERTINENT HISTORY: Bipolar, GERD, Gout, migraines, HLD, hypothyroidism, osteopenia, melanoma, CVA, SAH  PAIN:  Are you having pain? No  PRECAUTIONS: Fall  PATIENT GOALS: "to get some strength back in my right leg"  OBJECTIVE:  Note: Objective measures were completed at Evaluation unless otherwise noted.  DIAGNOSTIC FINDINGS: 03/14/21 IMPRESSION: 1. Chronic bilateral subdural hygromas, left larger than right. 2. No acute intracranial abnormality.                                                                                                                              TREATMENT  -Assessed R BP in sitting prior to interventions: Vitals:   10/05/23 0939  BP: 125/75  Pulse: (!) 58   -Laterally stepping for 8" cone taps when color called CGA > added sequencing component w/ 2-3 colors called at once -8" cone and dot setup for RLE toe flicks and hip flexion to flip dot off (ankle engagement and SLS) -8lb ball toe roll for hip flexion/extension x20 -8lb ball stop and roll from top of ball > stop and scoop to increase DF demand, used alternating LE due to L hip fatigue in SLS -5lb kettlebell R hip adduction and DF lifts to 2" target 5x5 in sitting, encouraged longer rest break as sets increased due to muscle fatigue and NMR principles -Attempted kettlebell lifts to 2" target in standing w/o UE support and w/, pt has difficulty in SLS as well as maintaining kettlebell on foot so regressed to 5lb ankle weight wrapped around foot for 2-4" taps progressing from RUE support to 2 fingertip support  PATIENT EDUCATION: Education details: Ways to practice DF tasks at home in seated/standing using plastic cup w/ counter for balance in standing. Person  educated: Patient Education method: Explanation, Demonstration, and Handouts Education comprehension: verbalized understanding and needs further education  HOME EXERCISE PROGRAM: Access Code: [email or web address] URL: [email or web address] Date: 09/08/2023 Prepared by: Nickola Baron  Exercises - Sit to Stand with Counter Support  - 1 x daily - 7 x weekly - 3 sets - 10 reps - Corner Balance Feet Together With Eyes Closed  - 1 x daily - 7 x weekly - 3 sets - 30s hold - Corner Balance Feet Together: Eyes Open With Head Turns  - 1 x daily - 7 x weekly - 3 sets - 30s hold - Semi-Tandem Corner Balance With Eyes Open  - 1 x daily - 7 x weekly - 3 sets - 30s hold - Long Sitting Ankle Eversion with Resistance  - 1 x daily - 7 x weekly - 3 sets - 10 reps - Long Sitting Ankle Plantar Flexion with Resistance  - 1 x daily - 7 x weekly - 3 sets - 10 reps - Long Sitting Ankle Inversion with Resistance  - 1 x daily - 7  x weekly - 3 sets - 10 reps - Long Sitting Ankle Dorsiflexion with Anchored Resistance  - 1 x daily - 7 x weekly - 3 sets - 10 reps - Seated Ankle Alphabet  - 1 x daily - 7 x weekly - 3 sets  GOALS: Goals reviewed with patient? Yes  SHORT TERM GOALS: Target date: 10/01/23  Pt will be independent with initial HEP for improved functional strength and balance  Baseline: to be updated; provided Goal status: MET  2.  Pt will improve gait speed to >/= 1.55m/s to demonstrate improved community ambulation  Baseline: 1.74m/s; 1.108m/s Goal status: NOT MET  3.  Pt will improve FGA to >/= 20/30 to demonstrate improved balance and reduced fall risk  Baseline: 15/30; 20/30 Goal status: MET  4.  Patient will improve ABC score to >/=70% to demonstrate Improved confidence in functional abilities  Baseline: 62.5%; 60% Goal status: NOT MET  5. Conditions 3 and 4 of MCTSIB to be assessed and LTG to be written  Baseline: Assessed 5/20  Goal status: MET   LONG TERM GOALS: Target date:  10/22/23  Pt will be independent with final HEP for improved functional strength and balance  Baseline: to be updated Goal status: INITIAL  2.  Patient will improve ABC score to >/=75% to demonstrate improved confidence in functional abilities  Baseline: 62.5% Goal status: INITIAL  3.  Pt will improve FGA to >/= 25/30 to demonstrate improved balance and reduced fall risk  Baseline: 15/30 Goal status: INITIAL  4.  Patient will demonstrate no sway during condition 4 of the mCTSIB in order to demonstrate improved balance strategies. Baseline: moderate sway, no LOB (5/20) Goal status: INITIAL   ASSESSMENT:  CLINICAL IMPRESSION: Emphasis of skilled PT session on addressing DF and hip flexion in seated and standing variations in order to meet functional strength and stability needs.  He continues to have instability in SLS, but demonstrates good right ankle DF activation with short sets utilized today.  Will continue to address deficits to reduce fall risk in coming sessions.  Continue per POC.   OBJECTIVE IMPAIRMENTS: Abnormal gait, decreased balance, difficulty walking, and decreased strength.   ACTIVITY LIMITATIONS: carrying, lifting, stairs, locomotion level, and caring for others  PARTICIPATION LIMITATIONS: interpersonal relationship and community activity  PERSONAL FACTORS: Age, Past/current experiences, and Time since onset of injury/illness/exacerbation are also affecting patient's functional outcome.   REHAB POTENTIAL: Fair time since onset  CLINICAL DECISION MAKING: Stable/uncomplicated  EVALUATION COMPLEXITY: Low  PLAN:  PT FREQUENCY: 2x/week  PT DURATION: 6 weeks  PLANNED INTERVENTIONS: 97164- PT Re-evaluation, 97750- Physical Performance Testing, 97110-Therapeutic exercises, 97530- Therapeutic activity, [id number]- Neuromuscular re-education, 97535- Self Care, 54098- Manual therapy, [phone]- Gait training, [phone]- Orthotic Initial, [phone]- Orthotic/Prosthetic subsequent,  [phone]- Aquatic Therapy, [phone]- Electrical stimulation (manual), Patient/Family education, Balance training, Stair training, Taping, Dry Needling, Joint mobilization, Spinal mobilization, Vestibular training, Visual/preceptual remediation/compensation, Cognitive remediation, and DME instructions  PLAN FOR NEXT SESSION:  R LE functional strength and balance, balance recovery, floor reach from sitting and standing - blaze pods, sitting on physioball, cone reach, forward fold,  dual tasking, R hip strength and SLS   Earlean Glaze, PT, DPT  10/05/2023, 10:53 AM

## 2023-10-07 ENCOUNTER — Ambulatory Visit

## 2023-10-07 VITALS — BP 122/69 | HR 88

## 2023-10-07 DIAGNOSIS — R2681 Unsteadiness on feet: Secondary | ICD-10-CM | POA: Diagnosis not present

## 2023-10-07 DIAGNOSIS — R278 Other lack of coordination: Secondary | ICD-10-CM

## 2023-10-07 DIAGNOSIS — R262 Difficulty in walking, not elsewhere classified: Secondary | ICD-10-CM | POA: Diagnosis not present

## 2023-10-07 DIAGNOSIS — M6281 Muscle weakness (generalized): Secondary | ICD-10-CM | POA: Diagnosis not present

## 2023-10-07 NOTE — Therapy (Signed)
 OUTPATIENT PHYSICAL THERAPY NEURO TREATMENT   Patient Name: Jeffrey Acosta MRN: [id number] DOB:[birth year], 73 y.o., male Today's Date: 10/07/2023   PCP: Devonna Foley, MD REFERRING PROVIDER: Devonna Foley, MD  END OF SESSION:  PT End of Session - 10/07/23 1010     Visit Number 8    Number of Visits 13    Date for PT Re-Evaluation 10/22/23    Authorization Type BCBS Medicare    PT Start Time 1015    PT Stop Time 1058    PT Time Calculation (min) 43 min    Equipment Utilized During Treatment Gait belt    Activity Tolerance Patient tolerated treatment well    Behavior During Therapy WFL for tasks assessed/performed          Past Medical History:  Diagnosis Date   Alcoholism in remission (HCC)    in AA   Arrhythmia    atrial tachycardia   Bipolar affective disorder (HCC)    Depression    Diverticulosis of colon (without mention of hemorrhage)    GERD (gastroesophageal reflux disease)    Gout of multiple sites    Hiatal hernia    Hx of migraines    migraines- last one since 9-10 yrs ago   Hydrocele    HYPERLIPIDEMIA 01/10/2009   Qualifier: Diagnosis of  By: Rochelle Chu CMA, Chemira    NMR Lipoprofile 2011 LDL could not be calculated  Due to TG of 889  (811  / 750 ),  HDL 50 . Father MI @ 57 PGM CVA early 50s; PGF CVA @72     Hypothyroidism    Melanoma of skin, site unspecified 11/25/2012   07/2011 abdominal  Stage 1 Dr Amy [place] Dr Jerilynn Montenegro and chest   Osteopenia    Polyarthralgia    Status post placement of implantable loop recorder 08/26/2020   Stroke (HCC)    x 3 - last one 04/2020   Subarachnoid hemorrhage [facility])    Past Surgical History:  Procedure Laterality Date   BILIARY BRUSHING  12/07/2018   Procedure: BILIARY BRUSHING;  Surgeon: Normie Becton., MD;  Location: [facility];  Service: Gastroenterology;;   BILIARY DILATION  12/07/2018   Procedure: BILIARY DILATION;  Surgeon: Normie Becton., MD;  Location: [facility];  Service: Gastroenterology;;    BIOPSY  12/07/2018   Procedure: BIOPSY;  Surgeon: Normie Becton., MD;  Location: [facility];  Service: Gastroenterology;;   CHOLECYSTECTOMY N/A 01/29/2021   Procedure: LAPAROSCOPIC CHOLECYSTECTOMY WITH ICG DYE;  Surgeon: Enid Harry, MD;  Location: [facility] OR;  Service: General;  Laterality: N/A;   CLOSED REDUCTION FINGER WITH PERCUTANEOUS PINNING Right 04/04/2021   Procedure: CLOSED REDUCTION WITH PERCUTANEOUS PINNING RIGHT THUMB;  Surgeon: Marilyn Shropshire, MD;  Location: [facility] OR;  Service: Orthopedics;  Laterality: Right;   COLONOSCOPY  2003   negative ; Dr Adan Holms   ERCP N/A 12/07/2018   Procedure: ENDOSCOPIC RETROGRADE CHOLANGIOPANCREATOGRAPHY (ERCP);  Surgeon: Normie Becton., MD;  Location: [facility];  Service: Gastroenterology;  Laterality: N/A;   ESOPHAGOGASTRODUODENOSCOPY (EGD) WITH PROPOFOL  N/A 12/07/2018   Procedure: ESOPHAGOGASTRODUODENOSCOPY (EGD) WITH PROPOFOL ;  Surgeon: Brice Campi Albino Alu., MD;  Location: [facility];  Service: Gastroenterology;  Laterality: N/A;   EUS N/A 12/07/2018   Procedure: UPPER ENDOSCOPIC ULTRASOUND (EUS) RADIAL;  Surgeon: Normie Becton., MD;  Location: [facility];  Service: Gastroenterology;  Laterality: N/A;   INGUINAL HERNIA REPAIR  08/07/2011   Procedure: HERNIA REPAIR INGUINAL ADULT;  Surgeon: Enid Harry, MD;  Location: [facility];  Service: General;  Laterality: Right;   IR ANGIO INTRA EXTRACRAN SEL INTERNAL CAROTID BILAT MOD SED  05/14/2020   IR ANGIO VERTEBRAL SEL VERTEBRAL BILAT MOD SED  05/14/2020   IR GUIDED DRAIN W CATHETER PLACEMENT  02/05/2021   IR RADIOLOGIST EVAL & MGMT  02/13/2021   LOOP RECORDER INSERTION  06/2020   last remote check was on 03/12/21   MELANOMA EXCISION  07/28/2011   Dr Jerilynn Montenegro; stomach and chest   REMOVAL OF STONES  12/07/2018   Procedure: REMOVAL OF STONES;  Surgeon: Normie Becton., MD;  Location: [facility];  Service: Gastroenterology;;    TONSILLECTOMY  1951   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   UPPER GASTROINTESTINAL ENDOSCOPY  1983   hiatal hernia   VASECTOMY  1979   Patient Active Problem List   Diagnosis Date Noted   MCI (mild cognitive impairment) 03/09/2022   Thumb dislocation, right, initial encounter    Closed dislocation of interphalangeal joint of right thumb 04/03/2021   Dyslipidemia 05/23/2020   Benign essential HTN 05/06/2020   Subarachnoid hemorrhage (HCC) 04/30/2020   Leukopenia 07/15/2018   Thrombocytopenia (HCC) 07/15/2018   PCP NOTES >>>>>>> 03/23/2015   Annual physical exam 03/20/2015   Bronchospasm 09/06/2014   DJD (degenerative joint disease) 09/06/2014   Hypothyroidism 08/28/2014   Tremor 07/29/2014   Gait disorder 07/29/2014   Gout 01/08/2014   Vitamin D  deficiency 11/25/2012   Melanoma of skin (HCC) 11/25/2012   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HCC) 11/25/2012   Alcoholism in remission (HCC) 06/19/2011   Diverticulitis of colon (without mention of hemorrhage)(562.11) 06/19/2011   Bipolar I disorder (HCC) 06/19/2011   Osteopenia 05/17/2010   HYPERTRIGLYCERIDEMIA 08/02/2009   Macrocytic anemia 01/10/2009   ABNORMAL ELECTROCARDIOGRAM 11/26/2008   REFLUX, ESOPHAGEAL 11/17/2006   COMMON MIGRAINE 09/22/2006    ONSET DATE: 08/30/23 referral  REFERRING DIAG:  Z61.Benny Braver (ICD-10-CM) - Fall, initial encounter  R29.898 (ICD-10-CM) - Right leg weakness  I69.30 (ICD-10-CM) - Sequela, post-stroke    THERAPY DIAG:  Unsteadiness on feet  Muscle weakness (generalized)  Difficulty in walking, not elsewhere classified  Other lack of coordination  Rationale for Evaluation and Treatment: Rehabilitation  SUBJECTIVE:                                                                                                                                                                                             SUBJECTIVE STATEMENT: Pt reports doing well. Not wearing foot up brace currently;  states he doesn't want to become reliant on it. Denies falls. No reported changes since last session.  Pt accompanied by: self  PERTINENT  HISTORY: Bipolar, GERD, Gout, migraines, HLD, hypothyroidism, osteopenia, melanoma, CVA, SAH  PAIN:  Are you having pain? No  PRECAUTIONS: Fall  PATIENT GOALS: to get some strength back in my right leg  OBJECTIVE:  Note: Objective measures were completed at Evaluation unless otherwise noted.  DIAGNOSTIC FINDINGS: 03/14/21 IMPRESSION: 1. Chronic bilateral subdural hygromas, left larger than right. 2. No acute intracranial abnormality.                                                                                                                              TREATMENT  -Assessed R BP in sitting prior to interventions:  Vitals:   10/07/23 1020 10/07/23 1037 10/07/23 1104  BP: 122/69    Pulse:  72 88  Sitting BP (pre-exercise); HR (post-squats on Airex); HR (post-resisted sidestepping)  Therex: - 3x 10 reps of squats (to chair) on Airex > added cog component with color taps, reaching outside of LOS - 2x resisted gait training fwd for 20 ft with stepping over boxes (4) - 2x resisted gait training laterally for 20 ft with stepping over boxes (4)   PATIENT EDUCATION: Education details: Ways to practice DF tasks at home in seated/standing using plastic cup w/ counter for balance in standing. Person educated: Patient Education method: Explanation, Demonstration, and Handouts Education comprehension: verbalized understanding and needs further education  HOME EXERCISE PROGRAM: Access Code: [email or web address] URL: [email or web address] Date: 09/08/2023 Prepared by: Nickola Baron  Exercises - Sit to Stand with Counter Support  - 1 x daily - 7 x weekly - 3 sets - 10 reps - Corner Balance Feet Together With Eyes Closed  - 1 x daily - 7 x weekly - 3 sets - 30s hold - Corner Balance Feet Together: Eyes Open With Head Turns  - 1 x daily - 7 x  weekly - 3 sets - 30s hold - Semi-Tandem Corner Balance With Eyes Open  - 1 x daily - 7 x weekly - 3 sets - 30s hold - Long Sitting Ankle Eversion with Resistance  - 1 x daily - 7 x weekly - 3 sets - 10 reps - Long Sitting Ankle Plantar Flexion with Resistance  - 1 x daily - 7 x weekly - 3 sets - 10 reps - Long Sitting Ankle Inversion with Resistance  - 1 x daily - 7 x weekly - 3 sets - 10 reps - Long Sitting Ankle Dorsiflexion with Anchored Resistance  - 1 x daily - 7 x weekly - 3 sets - 10 reps - Seated Ankle Alphabet  - 1 x daily - 7 x weekly - 3 sets  GOALS: Goals reviewed with patient? Yes  SHORT TERM GOALS: Target date: 10/01/23  Pt will be independent with initial HEP for improved functional strength and balance  Baseline: to be updated; provided Goal status: MET  2.  Pt will improve gait speed to >/= 1.28m/s to demonstrate improved community ambulation  Baseline: 1.45m/s; 1.33m/s Goal status: NOT  MET  3.  Pt will improve FGA to >/= 20/30 to demonstrate improved balance and reduced fall risk  Baseline: 15/30; 20/30 Goal status: MET  4.  Patient will improve ABC score to >/=70% to demonstrate Improved confidence in functional abilities  Baseline: 62.5%; 60% Goal status: NOT MET  5. Conditions 3 and 4 of MCTSIB to be assessed and LTG to be written  Baseline: Assessed 5/20  Goal status: MET   LONG TERM GOALS: Target date: 10/22/23  Pt will be independent with final HEP for improved functional strength and balance  Baseline: to be updated Goal status: INITIAL  2.  Patient will improve ABC score to >/=75% to demonstrate improved confidence in functional abilities  Baseline: 62.5% Goal status: INITIAL  3.  Pt will improve FGA to >/= 25/30 to demonstrate improved balance and reduced fall risk  Baseline: 15/30 Goal status: INITIAL  4.  Patient will demonstrate no sway during condition 4 of the mCTSIB in order to demonstrate improved balance strategies. Baseline:  moderate sway, no LOB (5/20) Goal status: INITIAL   ASSESSMENT:  CLINICAL IMPRESSION: Pt seen for skilled PT session with an emphasis on LE muscular strengthening and resisted gait training to address balance deficits in order to meet functional strength and stability needs. Pt continues to have instability and fatigues quickly in SLS and was particularly challenged with following commands in today's session. Pt required max VC/TC from PT for proper form and technique when performing squats with decent carryover. Will continue to address deficits to reduce fall risk in coming sessions. Continue per POC.   OBJECTIVE IMPAIRMENTS: Abnormal gait, decreased balance, difficulty walking, and decreased strength.   ACTIVITY LIMITATIONS: carrying, lifting, stairs, locomotion level, and caring for others  PARTICIPATION LIMITATIONS: interpersonal relationship and community activity  PERSONAL FACTORS: Age, Past/current experiences, and Time since onset of injury/illness/exacerbation are also affecting patient's functional outcome.   REHAB POTENTIAL: Fair time since onset  CLINICAL DECISION MAKING: Stable/uncomplicated  EVALUATION COMPLEXITY: Low  PLAN:  PT FREQUENCY: 2x/week  PT DURATION: 6 weeks  PLANNED INTERVENTIONS: 97164- PT Re-evaluation, 97750- Physical Performance Testing, 97110-Therapeutic exercises, 97530- Therapeutic activity, [id number]- Neuromuscular re-education, 97535- Self Care, 16109- Manual therapy, [phone]- Gait training, [phone]- Orthotic Initial, [phone]- Orthotic/Prosthetic subsequent, [phone]- Aquatic Therapy, [phone]- Electrical stimulation (manual), Patient/Family education, Balance training, Stair training, Taping, Dry Needling, Joint mobilization, Spinal mobilization, Vestibular training, Visual/preceptual remediation/compensation, Cognitive remediation, and DME instructions  PLAN FOR NEXT SESSION:  R LE functional strength and balance, balance recovery, floor reach from sitting and  standing - blaze pods, sitting on physioball, cone reach, forward fold,  dual tasking, R hip strength and SLS   Rebecca Campus, PT Rebecca Campus, PT, DPT, CBIS  10/07/2023, 1:38 PM

## 2023-10-11 ENCOUNTER — Ambulatory Visit

## 2023-10-11 VITALS — BP 118/78 | HR 60

## 2023-10-11 DIAGNOSIS — M6281 Muscle weakness (generalized): Secondary | ICD-10-CM

## 2023-10-11 DIAGNOSIS — R2681 Unsteadiness on feet: Secondary | ICD-10-CM

## 2023-10-11 DIAGNOSIS — R278 Other lack of coordination: Secondary | ICD-10-CM | POA: Diagnosis not present

## 2023-10-11 DIAGNOSIS — R262 Difficulty in walking, not elsewhere classified: Secondary | ICD-10-CM | POA: Diagnosis not present

## 2023-10-11 NOTE — Therapy (Signed)
 OUTPATIENT PHYSICAL THERAPY NEURO TREATMENT   Patient Name: Jeffrey Acosta MRN: [id number] DOB:Feb 11, 1951, 73 y.o., male Today's Date: 10/11/2023   PCP: Devonna Foley, MD REFERRING PROVIDER: Devonna Foley, MD  END OF SESSION:  PT End of Session - 10/11/23 1056     Visit Number 9    Number of Visits 13    Date for PT Re-Evaluation 10/22/23    Authorization Type BCBS Medicare    PT Start Time 1100    PT Stop Time 1145    PT Time Calculation (min) 45 min    Equipment Utilized During Treatment Gait belt    Activity Tolerance Patient tolerated treatment well    Behavior During Therapy WFL for tasks assessed/performed          Past Medical History:  Diagnosis Date   Alcoholism in remission (HCC)    in AA   Arrhythmia    atrial tachycardia   Bipolar affective disorder (HCC)    Depression    Diverticulosis of colon (without mention of hemorrhage)    GERD (gastroesophageal reflux disease)    Gout of multiple sites    Hiatal hernia    Hx of migraines    migraines- last one since 9-10 yrs ago   Hydrocele    HYPERLIPIDEMIA 01/10/2009   Qualifier: Diagnosis of  By: Rochelle Chu CMA, Chemira    NMR Lipoprofile 2011 LDL could not be calculated  Due to TG of 889  (811  / 750 ),  HDL 50 . Father MI @ 53 PGM CVA early 63s; PGF CVA @72     Hypothyroidism    Melanoma of skin, site unspecified 11/25/2012   07/2011 abdominal  Stage 1 Dr Amy [place] Dr Jerilynn Montenegro and chest   Osteopenia    Polyarthralgia    Status post placement of implantable loop recorder 08/26/2020   Stroke (HCC)    x 3 - last one 04/2020   Subarachnoid hemorrhage [facility])    Past Surgical History:  Procedure Laterality Date   BILIARY BRUSHING  12/07/2018   Procedure: BILIARY BRUSHING;  Surgeon: Normie Becton., MD;  Location: [facility];  Service: Gastroenterology;;   BILIARY DILATION  12/07/2018   Procedure: BILIARY DILATION;  Surgeon: Normie Becton., MD;  Location: [facility];  Service: Gastroenterology;;    BIOPSY  12/07/2018   Procedure: BIOPSY;  Surgeon: Normie Becton., MD;  Location: [facility];  Service: Gastroenterology;;   CHOLECYSTECTOMY N/A 01/29/2021   Procedure: LAPAROSCOPIC CHOLECYSTECTOMY WITH ICG DYE;  Surgeon: Enid Harry, MD;  Location: [facility] OR;  Service: General;  Laterality: N/A;   CLOSED REDUCTION FINGER WITH PERCUTANEOUS PINNING Right 04/04/2021   Procedure: CLOSED REDUCTION WITH PERCUTANEOUS PINNING RIGHT THUMB;  Surgeon: Marilyn Shropshire, MD;  Location: [facility] OR;  Service: Orthopedics;  Laterality: Right;   COLONOSCOPY  2003   negative ; Dr Adan Holms   ERCP N/A 12/07/2018   Procedure: ENDOSCOPIC RETROGRADE CHOLANGIOPANCREATOGRAPHY (ERCP);  Surgeon: Normie Becton., MD;  Location: [facility];  Service: Gastroenterology;  Laterality: N/A;   ESOPHAGOGASTRODUODENOSCOPY (EGD) WITH PROPOFOL  N/A 12/07/2018   Procedure: ESOPHAGOGASTRODUODENOSCOPY (EGD) WITH PROPOFOL ;  Surgeon: Brice Campi Albino Alu., MD;  Location: [facility];  Service: Gastroenterology;  Laterality: N/A;   EUS N/A 12/07/2018   Procedure: UPPER ENDOSCOPIC ULTRASOUND (EUS) RADIAL;  Surgeon: Normie Becton., MD;  Location: [facility];  Service: Gastroenterology;  Laterality: N/A;   INGUINAL HERNIA REPAIR  08/07/2011   Procedure: HERNIA REPAIR INGUINAL ADULT;  Surgeon: Enid Harry, MD;  Location: [facility];  Service: General;  Laterality: Right;   IR ANGIO INTRA EXTRACRAN SEL INTERNAL CAROTID BILAT MOD SED  05/14/2020   IR ANGIO VERTEBRAL SEL VERTEBRAL BILAT MOD SED  05/14/2020   IR GUIDED DRAIN W CATHETER PLACEMENT  02/05/2021   IR RADIOLOGIST EVAL & MGMT  02/13/2021   LOOP RECORDER INSERTION  06/2020   last remote check was on 03/12/21   MELANOMA EXCISION  07/28/2011   Dr Jerilynn Montenegro; stomach and chest   REMOVAL OF STONES  12/07/2018   Procedure: REMOVAL OF STONES;  Surgeon: Normie Becton., MD;  Location: [facility];  Service: Gastroenterology;;    TONSILLECTOMY  1951   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   UPPER GASTROINTESTINAL ENDOSCOPY  1983   hiatal hernia   VASECTOMY  1979   Patient Active Problem List   Diagnosis Date Noted   MCI (mild cognitive impairment) 03/09/2022   Thumb dislocation, right, initial encounter    Closed dislocation of interphalangeal joint of right thumb 04/03/2021   Dyslipidemia 05/23/2020   Benign essential HTN 05/06/2020   Subarachnoid hemorrhage (HCC) 04/30/2020   Leukopenia 07/15/2018   Thrombocytopenia (HCC) 07/15/2018   PCP NOTES >>>>>>> 03/23/2015   Annual physical exam 03/20/2015   Bronchospasm 09/06/2014   DJD (degenerative joint disease) 09/06/2014   Hypothyroidism 08/28/2014   Tremor 07/29/2014   Gait disorder 07/29/2014   Gout 01/08/2014   Vitamin D  deficiency 11/25/2012   Melanoma of skin (HCC) 11/25/2012   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HCC) 11/25/2012   Alcoholism in remission (HCC) 06/19/2011   Diverticulitis of colon (without mention of hemorrhage)(562.11) 06/19/2011   Bipolar I disorder (HCC) 06/19/2011   Osteopenia 05/17/2010   HYPERTRIGLYCERIDEMIA 08/02/2009   Macrocytic anemia 01/10/2009   ABNORMAL ELECTROCARDIOGRAM 11/26/2008   REFLUX, ESOPHAGEAL 11/17/2006   COMMON MIGRAINE 09/22/2006    ONSET DATE: 08/30/23 referral  REFERRING DIAG:  W09.Benny Braver (ICD-10-CM) - Fall, initial encounter  R29.898 (ICD-10-CM) - Right leg weakness  I69.30 (ICD-10-CM) - [place], post-stroke    THERAPY DIAG:  Unsteadiness on feet  Muscle weakness (generalized)  Difficulty in walking, not elsewhere classified  Other lack of coordination  Rationale for Evaluation and Treatment: Rehabilitation  SUBJECTIVE:                                                                                                                                                                                             SUBJECTIVE STATEMENT: Pt reports he is doing well. Has not been wearing foot up brace  at home and has not worn it to PT treatment sessions in over one week. Pt states he doesn't want to  become reliant on it. No falls and close calls since last session. Experienced soreness that subsided after 2 days following last session. No increase in pain. Pt has noticed he has felt more steady on his feet and strong in his LEs.   Pt accompanied by: self  PERTINENT HISTORY: Bipolar, GERD, Gout, migraines, HLD, hypothyroidism, osteopenia, melanoma, CVA, SAH  PAIN:  Are you having pain? No  PRECAUTIONS: Fall  PATIENT GOALS: to get some strength back in my right leg  OBJECTIVE:  Note: Objective measures were completed at Evaluation unless otherwise noted.  DIAGNOSTIC FINDINGS: 03/14/21 IMPRESSION: 1. Chronic bilateral subdural hygromas, left larger than right. 2. No acute intracranial abnormality.                                                                                                                              TREATMENT  -Assessed R BP in sitting prior to interventions:  Vitals:   10/11/23 1105  BP: 118/78  Pulse: 60  Sitting BP (pre-exercise)  Self-care/home management: - Pt education on regular use of foot up brace at home for safety purposes  Therex: - 3x 10 reps of mini squats on Airex > added cog component with color taps, reaching outside of LOS while alternating hands (RPE: 7-8/10)  - Round 1: bilat UE support required with CGA and mod cues and demostration for proper form and technique with squat  - Round 2: no UE support required with CGA and moderate verbal cues needed for posterior weight shift  - Round 3: no UE support required with CGA and minimal verbal cues - 4x step ups at the stairs > added cog component with color taps > distracting color taps > distracting color taps with PT-instructed foot taps (RPE: 6-7/10) - 2x resisted gait training fwd for 20 ft stepping over 4 boxes with CGA (RPE: 7/10) - 2x resisted gait training laterally for 20 ft  stepping over 4 boxes with CGA (RPE: 7/10)  PATIENT EDUCATION: Education details: Ways to practice DF tasks at home in seated/standing using plastic cup w/ counter for balance in standing. Person educated: Patient Education method: Explanation, Demonstration, and Handouts Education comprehension: verbalized understanding and needs further education  HOME EXERCISE PROGRAM: Access Code: [email or web address] URL: [email or web address] Date: 09/08/2023 Prepared by: Nickola Baron  Exercises - Sit to Stand with Counter Support  - 1 x daily - 7 x weekly - 3 sets - 10 reps - Corner Balance Feet Together With Eyes Closed  - 1 x daily - 7 x weekly - 3 sets - 30s hold - Corner Balance Feet Together: Eyes Open With Head Turns  - 1 x daily - 7 x weekly - 3 sets - 30s hold - Semi-Tandem Corner Balance With Eyes Open  - 1 x daily - 7 x weekly - 3 sets - 30s hold - Long Sitting Ankle Eversion with Resistance  - 1 x daily - 7 x weekly - 3 sets - 10  reps - Long Sitting Ankle Plantar Flexion with Resistance  - 1 x daily - 7 x weekly - 3 sets - 10 reps - Long Sitting Ankle Inversion with Resistance  - 1 x daily - 7 x weekly - 3 sets - 10 reps - Long Sitting Ankle Dorsiflexion with Anchored Resistance  - 1 x daily - 7 x weekly - 3 sets - 10 reps - Seated Ankle Alphabet  - 1 x daily - 7 x weekly - 3 sets  GOALS: Goals reviewed with patient? Yes  SHORT TERM GOALS: Target date: 10/01/23  Pt will be independent with initial HEP for improved functional strength and balance  Baseline: to be updated; provided Goal status: MET  2.  Pt will improve gait speed to >/= 1.60m/s to demonstrate improved community ambulation  Baseline: 1.55m/s; 1.84m/s Goal status: NOT MET  3.  Pt will improve FGA to >/= 20/30 to demonstrate improved balance and reduced fall risk  Baseline: 15/30; 20/30 Goal status: MET  4.  Patient will improve ABC score to >/=70% to demonstrate Improved confidence in functional abilities   Baseline: 62.5%; 60% Goal status: NOT MET  5. Conditions 3 and 4 of MCTSIB to be assessed and LTG to be written  Baseline: Assessed 5/20  Goal status: MET   LONG TERM GOALS: Target date: 10/22/23  Pt will be independent with final HEP for improved functional strength and balance  Baseline: to be updated Goal status: INITIAL  2.  Patient will improve ABC score to >/=75% to demonstrate improved confidence in functional abilities  Baseline: 62.5% Goal status: INITIAL  3.  Pt will improve FGA to >/= 25/30 to demonstrate improved balance and reduced fall risk  Baseline: 15/30 Goal status: INITIAL  4.  Patient will demonstrate no sway during condition 4 of the mCTSIB in order to demonstrate improved balance strategies. Baseline: moderate sway, no LOB (5/20) Goal status: INITIAL   ASSESSMENT:  CLINICAL IMPRESSION: Pt seen for skilled PT session with an emphasis on LE muscular strengthening and resisted gait training with the implementation of high-level cognitive components to address balance deficits in order to meet functional strength and stability needs. Pt is continuing to progress towards all goals set at initial evaluation. Pt exhibited a positive between session change with good carryover from last weeks treatment session. Pt was less challenged with following multi-step commands but continues to have minor instability and fatigues fairly quickly in SLS (RLE > LLE). Pt required minimal VC from PT for proper form and technique when performing squats by round 3. Will continue to address the aforementioned deficits to reduce overall fall risk and improve QoL. Continue POC.   OBJECTIVE IMPAIRMENTS: Abnormal gait, decreased balance, difficulty walking, and decreased strength.   ACTIVITY LIMITATIONS: carrying, lifting, stairs, locomotion level, and caring for others  PARTICIPATION LIMITATIONS: interpersonal relationship and community activity  PERSONAL FACTORS: Age, Past/current  experiences, and Time since onset of injury/illness/exacerbation are also affecting patient's functional outcome.   REHAB POTENTIAL: Fair time since onset  CLINICAL DECISION MAKING: Stable/uncomplicated  EVALUATION COMPLEXITY: Low  PLAN:  PT FREQUENCY: 2x/week  PT DURATION: 6 weeks  PLANNED INTERVENTIONS: 97164- PT Re-evaluation, 97750- Physical Performance Testing, 97110-Therapeutic exercises, 97530- Therapeutic activity, [id number]- Neuromuscular re-education, 97535- Self Care, [id number]- Manual therapy, [phone]- Gait training, [phone]- Orthotic Initial, [phone]- Orthotic/Prosthetic subsequent, [phone]- Aquatic Therapy, [phone]- Electrical stimulation (manual), Patient/Family education, Balance training, Stair training, Taping, Dry Needling, Joint mobilization, Spinal mobilization, Vestibular training, Visual/preceptual remediation/compensation, Cognitive remediation, and DME instructions  PLAN  FOR NEXT SESSION:  R LE functional strength and balance (i.e. SLS), high-level gait training, balance recovery, floor reach from sitting and standing - blaze pods, sitting on physioball, cone reach, dual tasking   [employer] [facility], Student-PT Rebecca Campus, PT, DPT, CBIS  10/11/2023, 11:48 AM

## 2023-10-13 ENCOUNTER — Encounter: Payer: Self-pay | Admitting: Physical Therapy

## 2023-10-13 ENCOUNTER — Ambulatory Visit: Admitting: Physical Therapy

## 2023-10-13 DIAGNOSIS — R262 Difficulty in walking, not elsewhere classified: Secondary | ICD-10-CM | POA: Diagnosis not present

## 2023-10-13 DIAGNOSIS — R278 Other lack of coordination: Secondary | ICD-10-CM

## 2023-10-13 DIAGNOSIS — M6281 Muscle weakness (generalized): Secondary | ICD-10-CM

## 2023-10-13 DIAGNOSIS — R2681 Unsteadiness on feet: Secondary | ICD-10-CM | POA: Diagnosis not present

## 2023-10-13 NOTE — Therapy (Signed)
 OUTPATIENT PHYSICAL THERAPY NEURO TREATMENT   Patient Name: Jeffrey Acosta MRN: [id number] DOB:[birth year], 73 y.o., male Today's Date: 10/13/2023   PCP: Devonna Foley, MD REFERRING PROVIDER: Devonna Foley, MD  END OF SESSION:  PT End of Session - 10/13/23 1023     Visit Number 10    Number of Visits 13    Date for PT Re-Evaluation 10/22/23    Authorization Type [employer] Medicare    Progress Note Due on Visit 10    PT Start Time 1015    PT Stop Time 1103    PT Time Calculation (min) 48 min    Equipment Utilized During Treatment Gait belt    Activity Tolerance Patient tolerated treatment well    Behavior During Therapy WFL for tasks assessed/performed          Past Medical History:  Diagnosis Date   Alcoholism in remission (HCC)    in AA   Arrhythmia    atrial tachycardia   Bipolar affective disorder (HCC)    Depression    Diverticulosis of colon (without mention of hemorrhage)    GERD (gastroesophageal reflux disease)    Gout of multiple sites    Hiatal hernia    Hx of migraines    migraines- last one since 9-10 yrs ago   Hydrocele    HYPERLIPIDEMIA 01/10/2009   Qualifier: Diagnosis of  By: Rochelle Chu CMA, Chemira    NMR Lipoprofile 2011 LDL could not be calculated  Due to TG of 889  (811  / 750 ),  HDL 50 . Father MI @ 41 PGM CVA early 47s; PGF CVA @72     Hypothyroidism    Melanoma of skin, site unspecified 11/25/2012   07/2011 abdominal  Stage 1 Dr Amy [place] Dr Jerilynn Montenegro and chest   Osteopenia    Polyarthralgia    Status post placement of implantable loop recorder 08/26/2020   Stroke (HCC)    x 3 - last one 04/2020   Subarachnoid hemorrhage [facility])    Past Surgical History:  Procedure Laterality Date   BILIARY BRUSHING  12/07/2018   Procedure: BILIARY BRUSHING;  Surgeon: Normie Becton., MD;  Location: [facility];  Service: Gastroenterology;;   BILIARY DILATION  12/07/2018   Procedure: BILIARY DILATION;  Surgeon: Normie Becton., MD;  Location: [facility];   Service: Gastroenterology;;   BIOPSY  12/07/2018   Procedure: BIOPSY;  Surgeon: Normie Becton., MD;  Location: [facility];  Service: Gastroenterology;;   CHOLECYSTECTOMY N/A 01/29/2021   Procedure: LAPAROSCOPIC CHOLECYSTECTOMY WITH ICG DYE;  Surgeon: Enid Harry, MD;  Location: [facility] OR;  Service: General;  Laterality: N/A;   CLOSED REDUCTION FINGER WITH PERCUTANEOUS PINNING Right 04/04/2021   Procedure: CLOSED REDUCTION WITH PERCUTANEOUS PINNING RIGHT THUMB;  Surgeon: Marilyn Shropshire, MD;  Location: [facility] OR;  Service: Orthopedics;  Laterality: Right;   COLONOSCOPY  2003   negative ; Dr Adan Holms   ERCP N/A 12/07/2018   Procedure: ENDOSCOPIC RETROGRADE CHOLANGIOPANCREATOGRAPHY (ERCP);  Surgeon: Normie Becton., MD;  Location: [facility];  Service: Gastroenterology;  Laterality: N/A;   ESOPHAGOGASTRODUODENOSCOPY (EGD) WITH PROPOFOL  N/A 12/07/2018   Procedure: ESOPHAGOGASTRODUODENOSCOPY (EGD) WITH PROPOFOL ;  Surgeon: Brice Campi Albino Alu., MD;  Location: [facility];  Service: Gastroenterology;  Laterality: N/A;   EUS N/A 12/07/2018   Procedure: UPPER ENDOSCOPIC ULTRASOUND (EUS) RADIAL;  Surgeon: Normie Becton., MD;  Location: [facility];  Service: Gastroenterology;  Laterality: N/A;   INGUINAL HERNIA REPAIR  08/07/2011   Procedure: HERNIA REPAIR INGUINAL ADULT;  Surgeon:  Enid Harry, MD;  Location: [facility];  Service: General;  Laterality: Right;   IR ANGIO INTRA EXTRACRAN SEL INTERNAL CAROTID BILAT MOD SED  05/14/2020   IR ANGIO VERTEBRAL SEL VERTEBRAL BILAT MOD SED  05/14/2020   IR GUIDED DRAIN W CATHETER PLACEMENT  02/05/2021   IR RADIOLOGIST EVAL & MGMT  02/13/2021   LOOP RECORDER INSERTION  06/2020   last remote check was on 03/12/21   MELANOMA EXCISION  07/28/2011   Dr Jerilynn Montenegro; stomach and chest   REMOVAL OF STONES  12/07/2018   Procedure: REMOVAL OF STONES;  Surgeon: Normie Becton., MD;  Location: [facility];   Service: Gastroenterology;;   TONSILLECTOMY  1951   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   UPPER GASTROINTESTINAL ENDOSCOPY  1983   hiatal hernia   VASECTOMY  1979   Patient Active Problem List   Diagnosis Date Noted   MCI (mild cognitive impairment) 03/09/2022   Thumb dislocation, right, initial encounter    Closed dislocation of interphalangeal joint of right thumb 04/03/2021   Dyslipidemia 05/23/2020   Benign essential HTN 05/06/2020   Subarachnoid hemorrhage (HCC) 04/30/2020   Leukopenia 07/15/2018   Thrombocytopenia (HCC) 07/15/2018   PCP NOTES >>>>>>> 03/23/2015   Annual physical exam 03/20/2015   Bronchospasm 09/06/2014   DJD (degenerative joint disease) 09/06/2014   Hypothyroidism 08/28/2014   Tremor 07/29/2014   Gait disorder 07/29/2014   Gout 01/08/2014   Vitamin D  deficiency 11/25/2012   Melanoma of skin (HCC) 11/25/2012   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HCC) 11/25/2012   Alcoholism in remission (HCC) 06/19/2011   Diverticulitis of colon (without mention of hemorrhage)(562.11) 06/19/2011   Bipolar I disorder (HCC) 06/19/2011   Osteopenia 05/17/2010   HYPERTRIGLYCERIDEMIA 08/02/2009   Macrocytic anemia 01/10/2009   ABNORMAL ELECTROCARDIOGRAM 11/26/2008   REFLUX, ESOPHAGEAL 11/17/2006   COMMON MIGRAINE 09/22/2006    ONSET DATE: 08/30/23 referral  REFERRING DIAG:  I34.Benny Braver (ICD-10-CM) - Fall, initial encounter  R29.898 (ICD-10-CM) - Right leg weakness  I69.30 (ICD-10-CM) - [place], post-stroke    THERAPY DIAG:  Unsteadiness on feet  Muscle weakness (generalized)  Difficulty in walking, not elsewhere classified  Other lack of coordination  Rationale for Evaluation and Treatment: Rehabilitation  SUBJECTIVE:                                                                                                                                                                                             SUBJECTIVE STATEMENT: Pt reports he is doing well. Is  wearing foot-up today and states it helps some.  No falls and close calls since last session.  He requests helps rescheduling 5/23 appt.  He does not have hearing aids in today.  Pt accompanied by: self  PERTINENT HISTORY: Bipolar, GERD, Gout, migraines, HLD, hypothyroidism, osteopenia, melanoma, CVA, SAH  PAIN:  Are you having pain? No  PRECAUTIONS: Fall  PATIENT GOALS: to get some strength back in my right leg  OBJECTIVE:  Note: Objective measures were completed at Evaluation unless otherwise noted.  DIAGNOSTIC FINDINGS: 03/14/21 IMPRESSION: 1. Chronic bilateral subdural hygromas, left larger than right. 2. No acute intracranial abnormality.                                                                                                                              TREATMENT  TherAct: -Time spent rescheduling appt 5/23 to 5/26 per pt preference.  NMR: -Visual scanning for 16 small puzzle blocks; mod cues on second reverse lap to narrow search areas, completes in 19 minutes 33 seconds > standing unsupported SBA on airex solving block puzzle (picture of pig on farm) x14 minutes 56 seconds, 1 posterior LOB when raising block up to look at it, mod cues for problem solving -Forward shallow lunge w/ rotating ball toss to wall CGA-minA for recovery steps (several LOB on push to stand) 2x25 ft > walking vertical ball toss CGA 2x25 ft  PATIENT EDUCATION: Education details: Appt changes and continue HEP.  Wear foot-up at home. Person educated: Patient Education method: Explanation, Demonstration, and Handouts Education comprehension: verbalized understanding and needs further education  HOME EXERCISE PROGRAM: Access Code: [email or web address] URL: [email or web address] Date: 09/08/2023 Prepared by: Nickola Baron  Exercises - Sit to Stand with Counter Support  - 1 x daily - 7 x weekly - 3 sets - 10 reps - Corner Balance Feet Together With Eyes Closed  - 1 x daily - 7 x weekly -  3 sets - 30s hold - Corner Balance Feet Together: Eyes Open With Head Turns  - 1 x daily - 7 x weekly - 3 sets - 30s hold - Semi-Tandem Corner Balance With Eyes Open  - 1 x daily - 7 x weekly - 3 sets - 30s hold - Long Sitting Ankle Eversion with Resistance  - 1 x daily - 7 x weekly - 3 sets - 10 reps - Long Sitting Ankle Plantar Flexion with Resistance  - 1 x daily - 7 x weekly - 3 sets - 10 reps - Long Sitting Ankle Inversion with Resistance  - 1 x daily - 7 x weekly - 3 sets - 10 reps - Long Sitting Ankle Dorsiflexion with Anchored Resistance  - 1 x daily - 7 x weekly - 3 sets - 10 reps - Seated Ankle Alphabet  - 1 x daily - 7 x weekly - 3 sets  GOALS: Goals reviewed with patient? Yes  SHORT TERM GOALS: Target date: 10/01/23  Pt will be independent with initial HEP for improved functional strength and balance  Baseline: to be updated; provided Goal status: MET  2.  Pt will improve gait speed to >/= 1.48m/s to demonstrate improved community ambulation  Baseline: 1.44m/s; 1.60m/s Goal status: NOT MET  3.  Pt will improve FGA to >/= 20/30 to demonstrate improved balance and reduced fall risk  Baseline: 15/30; 20/30 Goal status: MET  4.  Patient will improve ABC score to >/=70% to demonstrate Improved confidence in functional abilities  Baseline: 62.5%; 60% Goal status: NOT MET  5. Conditions 3 and 4 of MCTSIB to be assessed and LTG to be written  Baseline: Assessed 5/20  Goal status: MET   LONG TERM GOALS: Target date: 10/22/23  Pt will be independent with final HEP for improved functional strength and balance  Baseline: to be updated Goal status: INITIAL  2.  Patient will improve ABC score to >/=75% to demonstrate improved confidence in functional abilities  Baseline: 62.5% Goal status: INITIAL  3.  Pt will improve FGA to >/= 25/30 to demonstrate improved balance and reduced fall risk  Baseline: 15/30 Goal status: INITIAL  4.  Patient will demonstrate no sway during  condition 4 of the mCTSIB in order to demonstrate improved balance strategies. Baseline: moderate sway, no LOB (5/20) Goal status: INITIAL   ASSESSMENT:  CLINICAL IMPRESSION: Emphasis of skilled session today on cognitive dual tasking with pt having significant difficulty solving block puzzle w/o cuing.  He demonstrates some lack of retained cuing during visual scanning tasks.  Balance appeared less impacted by cognitive load w/ less dynamic activity so he would likely benefit from added dynamic component in future session.  Continue per POC.   OBJECTIVE IMPAIRMENTS: Abnormal gait, decreased balance, difficulty walking, and decreased strength.   ACTIVITY LIMITATIONS: carrying, lifting, stairs, locomotion level, and caring for others  PARTICIPATION LIMITATIONS: interpersonal relationship and community activity  PERSONAL FACTORS: Age, Past/current experiences, and Time since onset of injury/illness/exacerbation are also affecting patient's functional outcome.   REHAB POTENTIAL: Fair time since onset  CLINICAL DECISION MAKING: Stable/uncomplicated  EVALUATION COMPLEXITY: Low  PLAN:  PT FREQUENCY: 2x/week  PT DURATION: 6 weeks  PLANNED INTERVENTIONS: 97164- PT Re-evaluation, 97750- Physical Performance Testing, 97110-Therapeutic exercises, 97530- Therapeutic activity, [id number]- Neuromuscular re-education, 97535- Self Care, 09604- Manual therapy, [phone]- Gait training, [phone]- Orthotic Initial, [phone]- Orthotic/Prosthetic subsequent, [phone]- Aquatic Therapy, [phone]- Electrical stimulation (manual), Patient/Family education, Balance training, Stair training, Taping, Dry Needling, Joint mobilization, Spinal mobilization, Vestibular training, Visual/preceptual remediation/compensation, Cognitive remediation, and DME instructions  PLAN FOR NEXT SESSION:  R LE functional strength and balance (i.e. SLS), high-level gait training, balance recovery, floor reach from sitting and standing - blaze pods,  sitting on physioball, cone reach, dual tasking   Marilou Showman, PT, DPT  10/13/2023, 11:08 AM

## 2023-10-14 ENCOUNTER — Other Ambulatory Visit (INDEPENDENT_AMBULATORY_CARE_PROVIDER_SITE_OTHER)

## 2023-10-14 ENCOUNTER — Ambulatory Visit: Payer: Self-pay | Admitting: Internal Medicine

## 2023-10-14 DIAGNOSIS — E039 Hypothyroidism, unspecified: Secondary | ICD-10-CM | POA: Diagnosis not present

## 2023-10-14 LAB — TSH: TSH: 1.56 u[IU]/mL (ref 0.35–5.50)

## 2023-10-15 MED ORDER — LEVOTHYROXINE SODIUM 75 MCG PO TABS: 75.0000 ug | ORAL_TABLET | Freq: Every day | ORAL | 1 refills | Status: DC

## 2023-10-18 ENCOUNTER — Ambulatory Visit

## 2023-10-19 NOTE — Progress Notes (Signed)
 [facility] Summary Report / Loop Recorder

## 2023-10-20 ENCOUNTER — Ambulatory Visit

## 2023-10-20 VITALS — BP 152/75 | HR 60

## 2023-10-20 DIAGNOSIS — R278 Other lack of coordination: Secondary | ICD-10-CM | POA: Diagnosis not present

## 2023-10-20 DIAGNOSIS — R262 Difficulty in walking, not elsewhere classified: Secondary | ICD-10-CM | POA: Diagnosis not present

## 2023-10-20 DIAGNOSIS — R2681 Unsteadiness on feet: Secondary | ICD-10-CM | POA: Diagnosis not present

## 2023-10-20 DIAGNOSIS — M6281 Muscle weakness (generalized): Secondary | ICD-10-CM

## 2023-10-20 NOTE — Therapy (Signed)
 OUTPATIENT PHYSICAL THERAPY NEURO TREATMENT   Patient Name: Jeffrey Acosta MRN: [id number] DOB:08/17/1950, 73 y.o., male 14 Date: 10/20/2023   PCP: Aloysius Mech, MD REFERRING PROVIDER: Aloysius Mech, MD  END OF SESSION:  PT End of Session - 10/20/23 0957     Visit Number 11    Number of Visits 13    Date for PT Re-Evaluation 10/22/23    Authorization Type [employer] Medicare    PT Start Time 1000    PT Stop Time 1045    PT Time Calculation (min) 45 min    Equipment Utilized During Treatment Gait belt    Activity Tolerance Patient tolerated treatment well    Behavior During Therapy WFL for tasks assessed/performed          Past Medical History:  Diagnosis Date   Alcoholism in remission (HCC)    in AA   Arrhythmia    atrial tachycardia   Bipolar affective disorder (HCC)    Depression    Diverticulosis of colon (without mention of hemorrhage)    GERD (gastroesophageal reflux disease)    Gout of multiple sites    Hiatal hernia    Hx of migraines    migraines- last one since 9-10 yrs ago   Hydrocele    HYPERLIPIDEMIA 01/10/2009   Qualifier: Diagnosis of  By: Joshua CMA, Chemira    NMR Lipoprofile 2011 LDL could not be calculated  Due to TG of 889  (811  / 750 ),  HDL 50 . Father MI @ 34 PGM CVA early 71s; PGF CVA @72     Hypothyroidism    Melanoma of skin, site unspecified 11/25/2012   07/2011 abdominal  Stage 1 Dr Amy [place] Dr Jadine and chest   Osteopenia    Polyarthralgia    Status post placement of implantable loop recorder 08/26/2020   Stroke (HCC)    x 3 - last one 04/2020   Subarachnoid hemorrhage [facility])    Past Surgical History:  Procedure Laterality Date   BILIARY BRUSHING  12/07/2018   Procedure: BILIARY BRUSHING;  Surgeon: Wilhelmenia Aloha Raddle., MD;  Location: [facility];  Service: Gastroenterology;;   BILIARY DILATION  12/07/2018   Procedure: BILIARY DILATION;  Surgeon: Wilhelmenia Aloha Raddle., MD;  Location: [facility];  Service: Gastroenterology;;    BIOPSY  12/07/2018   Procedure: BIOPSY;  Surgeon: Wilhelmenia Aloha Raddle., MD;  Location: [facility];  Service: Gastroenterology;;   CHOLECYSTECTOMY N/A 01/29/2021   Procedure: LAPAROSCOPIC CHOLECYSTECTOMY WITH ICG DYE;  Surgeon: Ebbie Cough, MD;  Location: [facility] OR;  Service: General;  Laterality: N/A;   CLOSED REDUCTION FINGER WITH PERCUTANEOUS PINNING Right 04/04/2021   Procedure: CLOSED REDUCTION WITH PERCUTANEOUS PINNING RIGHT THUMB;  Surgeon: Romona Harari, MD;  Location: [facility] OR;  Service: Orthopedics;  Laterality: Right;   COLONOSCOPY  2003   negative ; Dr Jakie   ERCP N/A 12/07/2018   Procedure: ENDOSCOPIC RETROGRADE CHOLANGIOPANCREATOGRAPHY (ERCP);  Surgeon: Wilhelmenia Aloha Raddle., MD;  Location: [facility];  Service: Gastroenterology;  Laterality: N/A;   ESOPHAGOGASTRODUODENOSCOPY (EGD) WITH PROPOFOL  N/A 12/07/2018   Procedure: ESOPHAGOGASTRODUODENOSCOPY (EGD) WITH PROPOFOL ;  Surgeon: Wilhelmenia Aloha Raddle., MD;  Location: [facility];  Service: Gastroenterology;  Laterality: N/A;   EUS N/A 12/07/2018   Procedure: UPPER ENDOSCOPIC ULTRASOUND (EUS) RADIAL;  Surgeon: Wilhelmenia Aloha Raddle., MD;  Location: [facility];  Service: Gastroenterology;  Laterality: N/A;   INGUINAL HERNIA REPAIR  08/07/2011   Procedure: HERNIA REPAIR INGUINAL ADULT;  Surgeon: Cough Ebbie, MD;  Location: [facility];  Service: General;  Laterality: Right;   IR ANGIO INTRA EXTRACRAN SEL INTERNAL CAROTID BILAT MOD SED  05/14/2020   IR ANGIO VERTEBRAL SEL VERTEBRAL BILAT MOD SED  05/14/2020   IR GUIDED DRAIN W CATHETER PLACEMENT  02/05/2021   IR RADIOLOGIST EVAL & MGMT  02/13/2021   LOOP RECORDER INSERTION  06/2020   last remote check was on 03/12/21   MELANOMA EXCISION  07/28/2011   Dr Jadine; stomach and chest   REMOVAL OF STONES  12/07/2018   Procedure: REMOVAL OF STONES;  Surgeon: Wilhelmenia Aloha Raddle., MD;  Location: [facility];  Service: Gastroenterology;;    TONSILLECTOMY  1951   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   UPPER GASTROINTESTINAL ENDOSCOPY  1983   hiatal hernia   VASECTOMY  1979   Patient Active Problem List   Diagnosis Date Noted   MCI (mild cognitive impairment) 03/09/2022   Thumb dislocation, right, initial encounter    Closed dislocation of interphalangeal joint of right thumb 04/03/2021   Dyslipidemia 05/23/2020   Benign essential HTN 05/06/2020   Subarachnoid hemorrhage (HCC) 04/30/2020   Leukopenia 07/15/2018   Thrombocytopenia (HCC) 07/15/2018   PCP NOTES >>>>>>> 03/23/2015   Annual physical exam 03/20/2015   Bronchospasm 09/06/2014   DJD (degenerative joint disease) 09/06/2014   Hypothyroidism 08/28/2014   Tremor 07/29/2014   Gait disorder 07/29/2014   Gout 01/08/2014   Vitamin D  deficiency 11/25/2012   Melanoma of skin (HCC) 11/25/2012   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HCC) 11/25/2012   Alcoholism in remission (HCC) 06/19/2011   Diverticulitis of colon (without mention of hemorrhage)(562.11) 06/19/2011   Bipolar I disorder (HCC) 06/19/2011   Osteopenia 05/17/2010   HYPERTRIGLYCERIDEMIA 08/02/2009   Macrocytic anemia 01/10/2009   ABNORMAL ELECTROCARDIOGRAM 11/26/2008   REFLUX, ESOPHAGEAL 11/17/2006   COMMON MIGRAINE 09/22/2006    ONSET DATE: 08/30/23 referral  REFERRING DIAG:  T80.CHERENE (ICD-10-CM) - Fall, initial encounter  R29.898 (ICD-10-CM) - Right leg weakness  I69.30 (ICD-10-CM) - [place]ela, post-stroke    THERAPY DIAG:  Unsteadiness on feet  Muscle weakness (generalized)  Other lack of coordination  Difficulty in walking, not elsewhere classified  Rationale for Evaluation and Treatment: Rehabilitation  SUBJECTIVE:                                                                                                                                                                                             SUBJECTIVE STATEMENT: Pt reports he has never felt better. Pt has noticed major  positive changes in his balance and strength in his LEs since initial evaluation. Pt states he has been more consistent with using his  foot up brace at home. Pt reports he has been performing HEP 4-5x/week with success. Denies any falls and close calls since last session. Pt accompanied by: self  PERTINENT HISTORY: Bipolar, GERD, Gout, migraines, HLD, hypothyroidism, osteopenia, melanoma, CVA, SAH  PAIN:  Are you having pain? No   PRECAUTIONS: Fall  PATIENT GOALS: to get some strength back in my right leg  OBJECTIVE:  Note: Objective measures were completed at Evaluation unless otherwise noted.  DIAGNOSTIC FINDINGS: 03/14/21 IMPRESSION: 1. Chronic bilateral subdural hygromas, left larger than right. 2. No acute intracranial abnormality.                                                                                                                              TREATMENT   Patient Education / Self-Care:   Today's Vitals   10/20/23 1008  BP: (!) 152/75  Pulse: 60  *Sitting (pre-exercise); standing (pre-exercise) *Pt denies any lightheadedness and dizziness  Education on staying hydrated and replenishing body with electrolytes as pt has been spending a lot of time outside  Discussion on safety awareness when performing HEP Education on the purpose of strength training, therapeutic exercise dosage, and how to self-monitor for RPE at home Education on the importance of continual, regular use of the foot-up brace at home upon d/c Discussion on the implications a stroke has on cognition, making it difficult to dual-task    Therapeutic Activity: 12 reps of step ups with 6 block at parallel bars, ascend/descend with LLE - CGA RPE: 6-7/10 PT added cognitive dual tasking; pt challenged with processing, delayed response time noted 12 reps of step ups with 6 block at parallel bars, ascend/descend with RLE - CGA RPE: 6-7/10 PT added cognitive dual tasking; PT added cognitive dual  tasking; pt challenged with processing, delayed response time noted  Forward tandem walk with foam pad in parallel bars - CGA RPE: 7/10 Pt required verbal cues for upright posture and forward eye scanning with good carryover Gait Training:  4x 115' overground ambulation farmer's carry using 10# DB in R hand with SBA RPE: 6-7/10 PT added cognitive dual tasking with success PT administered 1 min rest break 4x 115' overground ambulation farmer's carry using 10# DB in L hand with SBA  RPE: 6-7/10 PT added cognitive dual tasking; pt had one LOB d/t lack of R foot clearance during swing phase PT provided education on decreasing gait speed to maintain balance with higher-level dual tasking at home for safety PT administered 1 min rest break   PATIENT EDUCATION: Education details: see above, self-care / patient education. Continue HEP.  Wear foot-up at home. Person educated: Patient Education method: Explanation, Demonstration, and Handouts Education comprehension: verbalized understanding and needs further education  HOME EXERCISE PROGRAM: Access Code: [email or web address] URL: [email or web address] Date: 09/08/2023 Prepared by: Delon Pop  Exercises - Sit to Stand with Counter Support  - 1 x daily - 7 x weekly - 3 sets - 10 reps - Corner  Balance Feet Together With Eyes Closed  - 1 x daily - 7 x weekly - 3 sets - 30s hold - Corner Balance Feet Together: Eyes Open With Head Turns  - 1 x daily - 7 x weekly - 3 sets - 30s hold - Semi-Tandem Corner Balance With Eyes Open  - 1 x daily - 7 x weekly - 3 sets - 30s hold - Long Sitting Ankle Eversion with Resistance  - 1 x daily - 7 x weekly - 3 sets - 10 reps - Long Sitting Ankle Plantar Flexion with Resistance  - 1 x daily - 7 x weekly - 3 sets - 10 reps - Long Sitting Ankle Inversion with Resistance  - 1 x daily - 7 x weekly - 3 sets - 10 reps - Long Sitting Ankle Dorsiflexion with Anchored Resistance  - 1 x daily - 7 x weekly - 3 sets -  10 reps - Seated Ankle Alphabet  - 1 x daily - 7 x weekly - 3 sets  GOALS: Goals reviewed with patient? Yes  SHORT TERM GOALS: Target date: 10/01/23  Pt will be independent with initial HEP for improved functional strength and balance  Baseline: to be updated; provided Goal status: MET  2.  Pt will improve gait speed to >/= 1.17m/s to demonstrate improved community ambulation  Baseline: 1.27m/s; 1.59m/s Goal status: NOT MET  3.  Pt will improve FGA to >/= 20/30 to demonstrate improved balance and reduced fall risk  Baseline: 15/30; 20/30 Goal status: MET  4.  Patient will improve ABC score to >/=70% to demonstrate Improved confidence in functional abilities  Baseline: 62.5%; 60% Goal status: NOT MET  5. Conditions 3 and 4 of MCTSIB to be assessed and LTG to be written  Baseline: Assessed 5/20  Goal status: MET   LONG TERM GOALS: Target date: 10/22/23  Pt will be independent with final HEP for improved functional strength and balance  Baseline: to be updated Goal status: INITIAL  2.  Patient will improve ABC score to >/=75% to demonstrate improved confidence in functional abilities  Baseline: 62.5% Goal status: INITIAL  3.  Pt will improve FGA to >/= 25/30 to demonstrate improved balance and reduced fall risk  Baseline: 15/30 Goal status: INITIAL  4.  Patient will demonstrate no sway during condition 4 of the mCTSIB in order to demonstrate improved balance strategies. Baseline: moderate sway, no LOB (5/20) Goal status: INITIAL   ASSESSMENT:  CLINICAL IMPRESSION: Pt seen for skilled PT treatment session with an emphasis on gait training, balance training, and bilat LE muscular strengthening - all with the incorporation of cognitive dual tasking. Pt continues to progress towards alll goals set at the initial evaluation. PT initiated discussion surrounding POC and potential d/c, pt verbalized understanding and agreeable. PT instructed pt to perform self-evaluation of HEP  so progressions can be made next session. Pt benefits from PT verbal instructions and demonstration prior to engaging in therapeutic activity. Pt continues to be challenged with maintaining balance in SLS when given cognitive dual tasking. Continue POC.   OBJECTIVE IMPAIRMENTS: Abnormal gait, decreased balance, difficulty walking, and decreased strength.   ACTIVITY LIMITATIONS: carrying, lifting, stairs, locomotion level, and caring for others  PARTICIPATION LIMITATIONS: interpersonal relationship and community activity  PERSONAL FACTORS: Age, Past/current experiences, and Time since onset of injury/illness/exacerbation are also affecting patient's functional outcome.   REHAB POTENTIAL: Fair time since onset  CLINICAL DECISION MAKING: Stable/uncomplicated  EVALUATION COMPLEXITY: Low  PLAN:  PT FREQUENCY: 2x/week  PT DURATION: 6 weeks  PLANNED INTERVENTIONS: 97164- PT Re-evaluation, 97750- Physical Performance Testing, 97110-Therapeutic exercises, 97530- Therapeutic activity, [id number]- Neuromuscular re-education, [phone]- Self Care, 02859- Manual therapy, [phone]- Gait training, [phone]- Orthotic Initial, [phone]- Orthotic/Prosthetic subsequent, [phone]- Aquatic Therapy, [phone]- Electrical stimulation (manual), Patient/Family education, Balance training, Stair training, Taping, Dry Needling, Joint mobilization, Spinal mobilization, Vestibular training, Visual/preceptual remediation/compensation, Cognitive remediation, and DME instructions  PLAN FOR NEXT SESSION: reassessment / goal check day, d/c?, update HEP (progress if indicated). Functional strength and balance (i.e. SLS) RLE>LLE, high-level gait training, balance recovery, floor reach from sitting and standing - blaze pods, sitting on physioball   Waddell Nailer, SPT 10/20/2023, 10:09 AM

## 2023-10-20 NOTE — Progress Notes (Addendum)
 [facility] Quality Team Note  Name: TYJUAN DEMETRO Date of Birth: [birth year] MRN: [id number] Date: 10/20/2023  [facility] Quality Team has reviewed this patient's chart, please see recommendations below:  [facility] Quality Other; (CHART REVIEWED FOR COLORECTAL CANCER SCREENING. NO RECORD FOUND)  06/02/2024- no cbp to close 2025

## 2023-10-21 ENCOUNTER — Ambulatory Visit

## 2023-10-21 VITALS — BP 159/88 | HR 61

## 2023-10-21 DIAGNOSIS — R2681 Unsteadiness on feet: Secondary | ICD-10-CM | POA: Diagnosis not present

## 2023-10-21 DIAGNOSIS — R278 Other lack of coordination: Secondary | ICD-10-CM

## 2023-10-21 DIAGNOSIS — M6281 Muscle weakness (generalized): Secondary | ICD-10-CM | POA: Diagnosis not present

## 2023-10-21 DIAGNOSIS — R262 Difficulty in walking, not elsewhere classified: Secondary | ICD-10-CM

## 2023-10-21 NOTE — Therapy (Signed)
 OUTPATIENT PHYSICAL THERAPY NEURO DISCHARGE SUMMARY   Patient Name: Jeffrey Acosta MRN: [id number] DOB:09-29-1950, 73 y.o., male Today's Date: 10/21/2023   PCP: Aloysius Mech, MD REFERRING PROVIDER: Aloysius Mech, MD  PHYSICAL THERAPY DISCHARGE SUMMARY  Visits from Start of Care: 12  Current functional level related to goals / functional outcomes: See LTG   Remaining deficits: Mild balance deficits   Education / Equipment: HEP, patient education on foot-up brace use at home   Patient agrees to discharge. Patient goals were partially met. Patient is being discharged due to being pleased with the current functional level.  END OF SESSION:  PT End of Session - 10/21/23 1106     Visit Number 12    Number of Visits 13    Date for PT Re-Evaluation 10/22/23    Authorization Type [employer] Medicare    PT Start Time 1107    PT Stop Time 1147    PT Time Calculation (min) 40 min    Equipment Utilized During Treatment Gait belt    Activity Tolerance Patient tolerated treatment well    Behavior During Therapy WFL for tasks assessed/performed          Past Medical History:  Diagnosis Date   Alcoholism in remission (HCC)    in AA   Arrhythmia    atrial tachycardia   Bipolar affective disorder (HCC)    Depression    Diverticulosis of colon (without mention of hemorrhage)    GERD (gastroesophageal reflux disease)    Gout of multiple sites    Hiatal hernia    Hx of migraines    migraines- last one since 9-10 yrs ago   Hydrocele    HYPERLIPIDEMIA 01/10/2009   Qualifier: Diagnosis of  By: Joshua CMA, Chemira    NMR Lipoprofile 2011 LDL could not be calculated  Due to TG of 889  (811  / 750 ),  HDL 50 . Father MI @ 23 PGM CVA early 38s; PGF CVA @72     Hypothyroidism    Melanoma of skin, site unspecified 11/25/2012   07/2011 abdominal  Stage 1 Dr Amy [place] Dr Jadine and chest   Osteopenia    Polyarthralgia    Status post placement of implantable loop recorder 08/26/2020   Stroke (HCC)     x 3 - last one 04/2020   Subarachnoid hemorrhage [facility])    Past Surgical History:  Procedure Laterality Date   BILIARY BRUSHING  12/07/2018   Procedure: BILIARY BRUSHING;  Surgeon: Wilhelmenia Aloha Raddle., MD;  Location: [facility];  Service: Gastroenterology;;   BILIARY DILATION  12/07/2018   Procedure: BILIARY DILATION;  Surgeon: Wilhelmenia Aloha Raddle., MD;  Location: [facility];  Service: Gastroenterology;;   BIOPSY  12/07/2018   Procedure: BIOPSY;  Surgeon: Wilhelmenia Aloha Raddle., MD;  Location: [facility];  Service: Gastroenterology;;   CHOLECYSTECTOMY N/A 01/29/2021   Procedure: LAPAROSCOPIC CHOLECYSTECTOMY WITH ICG DYE;  Surgeon: Ebbie Cough, MD;  Location: [facility] OR;  Service: General;  Laterality: N/A;   CLOSED REDUCTION FINGER WITH PERCUTANEOUS PINNING Right 04/04/2021   Procedure: CLOSED REDUCTION WITH PERCUTANEOUS PINNING RIGHT THUMB;  Surgeon: Romona Harari, MD;  [facility];  Service: Orthopedics;  Laterality: Right;   COLONOSCOPY  2003   negative ; Dr Jakie   ERCP N/A 12/07/2018   Procedure: ENDOSCOPIC RETROGRADE CHOLANGIOPANCREATOGRAPHY (ERCP);  Surgeon: Wilhelmenia Aloha Raddle., MD;  Location: [facility];  Service: Gastroenterology;  Laterality: N/A;   ESOPHAGOGASTRODUODENOSCOPY (EGD) WITH PROPOFOL  N/A 12/07/2018   Procedure: ESOPHAGOGASTRODUODENOSCOPY (EGD) WITH PROPOFOL ;  Surgeon: Wilhelmenia Aloha Raddle., MD;  Location: [facility];  Service: Gastroenterology;  Laterality: N/A;   EUS N/A 12/07/2018   Procedure: UPPER ENDOSCOPIC ULTRASOUND (EUS) RADIAL;  Surgeon: Wilhelmenia Aloha Raddle., MD;  Location: [facility];  Service: Gastroenterology;  Laterality: N/A;   INGUINAL HERNIA REPAIR  08/07/2011   Procedure: HERNIA REPAIR INGUINAL ADULT;  Surgeon: Donnice Bury, MD;  Location: [facility];  Service: General;  Laterality: Right;   IR ANGIO INTRA EXTRACRAN SEL INTERNAL CAROTID BILAT MOD SED  05/14/2020   IR ANGIO VERTEBRAL SEL  VERTEBRAL BILAT MOD SED  05/14/2020   IR GUIDED DRAIN W CATHETER PLACEMENT  02/05/2021   IR RADIOLOGIST EVAL & MGMT  02/13/2021   LOOP RECORDER INSERTION  06/2020   last remote check was on 03/12/21   MELANOMA EXCISION  07/28/2011   Dr Jadine; stomach and chest   REMOVAL OF STONES  12/07/2018   Procedure: REMOVAL OF STONES;  Surgeon: Wilhelmenia Aloha Raddle., MD;  Location: [facility];  Service: Gastroenterology;;   TONSILLECTOMY  1951   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   UPPER GASTROINTESTINAL ENDOSCOPY  1983   hiatal hernia   VASECTOMY  1979   Patient Active Problem List   Diagnosis Date Noted   MCI (mild cognitive impairment) 03/09/2022   Thumb dislocation, right, initial encounter    Closed dislocation of interphalangeal joint of right thumb 04/03/2021   Dyslipidemia 05/23/2020   Benign essential HTN 05/06/2020   Subarachnoid hemorrhage (HCC) 04/30/2020   Leukopenia 07/15/2018   Thrombocytopenia (HCC) 07/15/2018   PCP NOTES >>>>>>> 03/23/2015   Annual physical exam 03/20/2015   Bronchospasm 09/06/2014   DJD (degenerative joint disease) 09/06/2014   Hypothyroidism 08/28/2014   Tremor 07/29/2014   Gait disorder 07/29/2014   Gout 01/08/2014   Vitamin D  deficiency 11/25/2012   Melanoma of skin (HCC) 11/25/2012   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HCC) 11/25/2012   Alcoholism in remission (HCC) 06/19/2011   Diverticulitis of colon (without mention of hemorrhage)(562.11) 06/19/2011   Bipolar I disorder (HCC) 06/19/2011   Osteopenia 05/17/2010   HYPERTRIGLYCERIDEMIA 08/02/2009   Macrocytic anemia 01/10/2009   ABNORMAL ELECTROCARDIOGRAM 11/26/2008   REFLUX, ESOPHAGEAL 11/17/2006   COMMON MIGRAINE 09/22/2006    ONSET DATE: 08/30/23 referral  REFERRING DIAG:  T80.CHERENE (ICD-10-CM) - Fall, initial encounter  R29.898 (ICD-10-CM) - Right leg weakness  I69.30 (ICD-10-CM) - Sequela, post-stroke    THERAPY DIAG:  Unsteadiness on feet  Muscle weakness  (generalized)  Other lack of coordination  Difficulty in walking, not elsewhere classified  Rationale for Evaluation and Treatment: Rehabilitation  SUBJECTIVE:  SUBJECTIVE STATEMENT: Pt presents alone with R foot-up brace on. Pt reports he is doing extremely well. ISQ. Pt states he is ready to graduate.  Pt accompanied by: self  PERTINENT HISTORY: Bipolar, GERD, Gout, migraines, HLD, hypothyroidism, osteopenia, melanoma, CVA, SAH  PAIN:  Are you having pain? No   PRECAUTIONS: Fall  PATIENT GOALS: to get some strength back in my right leg  OBJECTIVE:  Note: Objective measures were completed at Evaluation unless otherwise noted.  DIAGNOSTIC FINDINGS: 03/14/21 IMPRESSION: 1. Chronic bilateral subdural hygromas, left larger than right. 2. No acute intracranial abnormality.                                                                                                                              TREATMENT   Patient Education / Self-Care:   Vitals:   10/21/23 1112  BP: (!) 159/88  Pulse: 61   *Sitting (pre-exercise); standing (pre-exercise) *Pt denies any lightheadedness and dizziness  Education on continued safety awareness when performing HEP upon d/c Discussion on prioritize performing bilat LE strengthening therex in HEP with proper form and technique vs. higher rep count  Discussion on how to safely incorporate dual-tasking into HEP with wife's assistance    Therapeutic Activity:   [facility] PT Assessment - 10/21/23 0001       Functional Gait  Assessment   Gait assessed  Yes    Gait Level Surface Walks 20 ft in less than 5.5 sec, no assistive devices, good speed, no evidence for imbalance, normal gait pattern, deviates no more than 6 in outside of the 12 in walkway width.    4.98 seconds   Change in Gait Speed Able to smoothly change walking speed without loss of balance or gait deviation. Deviate no more than 6 in outside of the 12 in walkway width.    Gait with Horizontal Head Turns Performs head turns smoothly with slight change in gait velocity (eg, minor disruption to smooth gait path), deviates 6-10 in outside 12 in walkway width, or uses an assistive device.    Gait with Vertical Head Turns Performs head turns with no change in gait. Deviates no more than 6 in outside 12 in walkway width.    Gait and Pivot Turn Pivot turns safely within 3 sec and stops quickly with no loss of balance.    Step Over Obstacle Is able to step over one shoe box (4.5 in total height) without changing gait speed. No evidence of imbalance.    Gait with Narrow Base of Support Ambulates 7-9 steps.    Gait with Eyes Closed Walks 20 ft, uses assistive device, slower speed, mild gait deviations, deviates 6-10 in outside 12 in walkway width. Ambulates 20 ft in less than 9 sec but greater than 7 sec.    Ambulating Backwards Walks 20 ft, uses assistive device, slower speed, mild gait deviations, deviates 6-10 in outside 12 in walkway width.   14 seconds   Steps Alternating  feet, must use rail.    Total Score 24          M-CTSIB  Condition 1: Firm Surface, EO 30 Sec, Normal Sway  Condition 2: Firm Surface, EC 30 Sec, Normal Sway  Condition 3: Foam Surface, EO 30 Sec, Normal Sway  Condition 4: Foam Surface, EC 30 Sec, Normal Sway    8 reps of mini lunges with counter support - using one hand Pt required verbal instructions and a demonstration before engaging in therex Pt required moderate verbal cues for proper form and technique with good carryover    PATIENT EDUCATION: Education details: see above, self-care / patient education, updated HEP given, continue wear of foot-up at home Person educated: Patient Education method: Explanation, Demonstration, and Handouts Education  comprehension: verbalized understanding and needs further education  HOME EXERCISE PROGRAM: Access Code: [email or web address] URL: [email or web address] Date: 09/08/2023 Prepared by: Delon Pop  Exercises - Sit to Stand with Counter Support  - 1 x daily - 7 x weekly - 3 sets - 10 reps - Corner Balance Feet Together With Eyes Closed  - 1 x daily - 7 x weekly - 3 sets - 30s hold - Corner Balance Feet Together: Eyes Open With Head Turns  - 1 x daily - 7 x weekly - 3 sets - 30s hold - Semi-Tandem Corner Balance With Eyes Open  - 1 x daily - 7 x weekly - 3 sets - 30s hold - Long Sitting Ankle Eversion with Resistance  - 1 x daily - 7 x weekly - 3 sets - 10 reps - Long Sitting Ankle Plantar Flexion with Resistance  - 1 x daily - 7 x weekly - 3 sets - 10 reps - Long Sitting Ankle Inversion with Resistance  - 1 x daily - 7 x weekly - 3 sets - 10 reps - Long Sitting Ankle Dorsiflexion with Anchored Resistance  - 1 x daily - 7 x weekly - 3 sets - 10 reps - Seated Ankle Alphabet  - 1 x daily - 7 x weekly - 3 sets   Added bilat step ups and mini lunges (10/21/23)  GOALS: Goals reviewed with patient? Yes  SHORT TERM GOALS: Target date: 10/01/23  Pt will be independent with initial HEP for improved functional strength and balance  Baseline: to be updated; provided Goal status: MET  2.  Pt will improve gait speed to >/= 1.23m/s to demonstrate improved community ambulation  Baseline: 1.58m/s; 1.62m/s Goal status: NOT MET  3.  Pt will improve FGA to >/= 20/30 to demonstrate improved balance and reduced fall risk  Baseline: 15/30; 20/30 Goal status: MET  4.  Patient will improve ABC score to >/=70% to demonstrate Improved confidence in functional abilities  Baseline: 62.5%; 60% Goal status: NOT MET  5. Conditions 3 and 4 of MCTSIB to be assessed and LTG to be written  Baseline: Assessed 5/20  Goal status: MET   LONG TERM GOALS: Target date: 10/22/23  Pt will be independent with  final HEP for improved functional strength and balance  Baseline: to be updated Goal status: MET  2.  Patient will improve ABC score to >/=75% to demonstrate improved confidence in functional abilities  Baseline: 62.5%; 52.5% Goal status: NOT MET; pt self-reported feeling more confident in his balance, ABC score aside  3.  Pt will improve FGA to >/= 25/30 to demonstrate improved balance and reduced fall risk  Baseline: 15/30 Goal status: NOT MET; 24/30 (10/21/23)   4.  Patient will demonstrate  no sway during condition 4 of the mCTSIB in order to demonstrate improved balance strategies. Baseline: moderate sway, no LOB (5/20) Goal status: MET; no sway, no LOB (10/21/23)   ASSESSMENT:  CLINICAL IMPRESSION: Pt seen for skilled PT session with an emphasis on LTG assessment and education on update HEP upon d/c. Pt partially met all LTG - most notably a decrease in ABC score. PT suspects pt has become hyperfixated/hyperaware of his balance deficits that he is unable to see, objectively, how much progress has been made since the initial evaluation. PT proceeded to question pt on how he felt his balance, pt self-reported that he felt his balance has improved immensely and he feels much more steady on this feet. PT added to 2 new therex to HEP targeting bilat LE muscular strengthening. PT instructed pt to continue with regular use of the foot-up brace at home. Pt agreeable to d/c at this time. PT provided updated HEP handout, writing out reminders to perform bilat LE muscular strengthening therex safely in a slow, controlled manner.   OBJECTIVE IMPAIRMENTS: Abnormal gait, decreased balance, difficulty walking, and decreased strength.   ACTIVITY LIMITATIONS: carrying, lifting, stairs, locomotion level, and caring for others  PARTICIPATION LIMITATIONS: interpersonal relationship and community activity  PERSONAL FACTORS: Age, Past/current experiences, and Time since onset of  injury/illness/exacerbation are also affecting patient's functional outcome.   REHAB POTENTIAL: Fair time since onset  CLINICAL DECISION MAKING: Stable/uncomplicated  EVALUATION COMPLEXITY: Low  PLAN:  PT FREQUENCY: 2x/week  PT DURATION: 6 weeks  PLANNED INTERVENTIONS: [id number]- PT Re-evaluation, 97750- Physical Performance Testing, 97110-Therapeutic exercises, 97530- Therapeutic activity, [id number]- Neuromuscular re-education, 97535- Self Care, [id number]- Manual therapy, [phone]- Gait training, [phone]- Orthotic Initial, [phone]- Orthotic/Prosthetic subsequent, [phone]- Aquatic Therapy, [phone]- Electrical stimulation (manual), Patient/Family education, Balance training, Stair training, Taping, Dry Needling, Joint mobilization, Spinal mobilization, Vestibular training, Visual/preceptual remediation/compensation, Cognitive remediation, and DME instructions  PLAN FOR NEXT SESSION: d/c from PT   Waddell Nailer, SPT 10/21/2023, 12:28 PM

## 2023-10-22 ENCOUNTER — Other Ambulatory Visit: Payer: Self-pay | Admitting: Internal Medicine

## 2023-10-24 NOTE — Progress Notes (Unsigned)
 Ellouise Console, PA-C [address] [place], [place]  [address] Phone: [phone]   Primary Care Physician: Amon Aloysius BRAVO, MD  Primary Gastroenterologist:  Ellouise Console, PA-C / Norleen Kiang, MD   Chief Complaint:  Discuss Colon; Barrett's, GERD        HPI:   DURWARD MATRANGA is a 73 y.o. male presents to discuss scheduling EGD and colonoscopy.  09/2020 last EGD by Dr. Kiang for surveillance of Barrett's: Barrett's mucosa involving the distal 6 cm of the esophagus.  Stomach and duodenum normal.  3-year repeat (currently due).  10/2013 last colonoscopy by Dr. Kiang: Moderate left-sided diverticulosis, otherwise normal.  No polyps.  10-year repeat (currently due).  Current symptoms: He is currently taking Nexium  40 Mg once daily.  PMH: Hypertension, GERD, bipolar disorder, history of alcohol abuse in remission, history of stroke, subarachnoid hemorrhage (04/2020), currently on Plavix  daily.  Echo 04/2020 showed LVEF 55 to 60%.  Current Outpatient Medications  Medication Sig Dispense Refill   allopurinol  (ZYLOPRIM ) 300 MG tablet Take 1 tablet (300 mg total) by mouth daily. 90 tablet 1   atorvastatin  (LIPITOR) 20 MG tablet Take 1 tablet (20 mg total) by mouth at bedtime. 90 tablet 1   Cholecalciferol (VITAMIN D ) 50 MCG (2000 UT) tablet Take 2,000 Units by mouth daily.     clopidogrel  (PLAVIX ) 75 MG tablet Take 1 tablet (75 mg total) by mouth daily. 90 tablet 1   esomeprazole  (NEXIUM ) 40 MG capsule TAKE 1 CAPSULE(40 MG) BY MOUTH DAILY BEFORE BREAKFAST 90 capsule 1   fluticasone  (FLONASE ) 50 MCG/ACT nasal spray Place 2 sprays into both nostrils daily as needed for congestion.     L-Methylfolate-B12-B6-B2 (CEREFOLIN) 09-25-48-5 MG TABS TAKE 1 TABLET BY MOUTH EVERY MORNING 90 tablet 1   lamoTRIgine  (LAMICTAL ) 25 MG tablet Take 25 mg by mouth daily.     levothyroxine  (SYNTHROID ) 75 MCG tablet Take 1 tablet (75 mcg total) by mouth daily before breakfast. 90 tablet 1   metoprolol  succinate  (TOPROL -XL) 100 MG 24 hr tablet Take 1.5 tablets (150 mg total) by mouth daily. Take with or immediately following a meal 135 tablet 1   naltrexone (DEPADE) 50 MG tablet Take 50 mg by mouth daily.   12   QUEtiapine  (SEROQUEL ) 400 MG tablet Take 800 mg by mouth at bedtime.     Thiamine  HCl (VITAMIN B-1) 250 MG tablet Take 1 tablet (250 mg total) by mouth daily. 90 tablet 3   timolol (TIMOPTIC) 0.5 % ophthalmic solution Place 1 drop into both eyes 2 (two) times daily.     No current facility-administered medications for this visit.    Allergies as of 10/25/2023 - Review Complete 10/21/2023  Allergen Reaction Noted   Iohexol  Hives 11/14/2004    Past Medical History:  Diagnosis Date   Alcoholism in remission (HCC)    in AA   Arrhythmia    atrial tachycardia   Bipolar affective disorder (HCC)    Depression    Diverticulosis of colon (without mention of hemorrhage)    GERD (gastroesophageal reflux disease)    Gout of multiple sites    Hiatal hernia    Hx of migraines    migraines- last one since 9-10 yrs ago   Hydrocele    HYPERLIPIDEMIA 01/10/2009   Qualifier: Diagnosis of  By: Joshua CMA, Chemira    NMR Lipoprofile 2011 LDL could not be calculated  Due to TG of 889  (811  / 750 ),  HDL 50 . Father MI @ 50 PGM CVA early 76s; PGF CVA @72     Hypothyroidism    Melanoma of skin, site unspecified 11/25/2012   07/2011 abdominal  Stage 1 Dr Amy [place] Dr Jadine and chest   Osteopenia    Polyarthralgia    Status post placement of implantable loop recorder 08/26/2020   Stroke (HCC)    x 3 - last one 04/2020   Subarachnoid hemorrhage [facility])     Past Surgical History:  Procedure Laterality Date   BILIARY BRUSHING  12/07/2018   Procedure: BILIARY BRUSHING;  Surgeon: Wilhelmenia Aloha Raddle., MD;  Location: [facility];  Service: Gastroenterology;;   BILIARY DILATION  12/07/2018   Procedure: BILIARY DILATION;  Surgeon: Wilhelmenia Aloha Raddle., MD;  Location: [facility];  Service:  Gastroenterology;;   BIOPSY  12/07/2018   Procedure: BIOPSY;  Surgeon: Wilhelmenia Aloha Raddle., MD;  Location: [facility];  Service: Gastroenterology;;   CHOLECYSTECTOMY N/A 01/29/2021   Procedure: LAPAROSCOPIC CHOLECYSTECTOMY WITH ICG DYE;  Surgeon: Ebbie Cough, MD;  Location: [facility];  Service: General;  Laterality: N/A;   CLOSED REDUCTION FINGER WITH PERCUTANEOUS PINNING Right 04/04/2021   Procedure: CLOSED REDUCTION WITH PERCUTANEOUS PINNING RIGHT THUMB;  Surgeon: Romona Harari, MD;  [facility];  Service: Orthopedics;  Laterality: Right;   COLONOSCOPY  2003   negative ; Dr Jakie   ERCP N/A 12/07/2018   Procedure: ENDOSCOPIC RETROGRADE CHOLANGIOPANCREATOGRAPHY (ERCP);  Surgeon: Wilhelmenia Aloha Raddle., MD;  Location: [facility];  Service: Gastroenterology;  Laterality: N/A;   ESOPHAGOGASTRODUODENOSCOPY (EGD) WITH PROPOFOL  N/A 12/07/2018   Procedure: ESOPHAGOGASTRODUODENOSCOPY (EGD) WITH PROPOFOL ;  Surgeon: Wilhelmenia Aloha Raddle., MD;  Location: [facility];  Service: Gastroenterology;  Laterality: N/A;   EUS N/A 12/07/2018   Procedure: UPPER ENDOSCOPIC ULTRASOUND (EUS) RADIAL;  Surgeon: Wilhelmenia Aloha Raddle., MD;  Location: [facility];  Service: Gastroenterology;  Laterality: N/A;   INGUINAL HERNIA REPAIR  08/07/2011   Procedure: HERNIA REPAIR INGUINAL ADULT;  Surgeon: Cough Ebbie, MD;  Location: [facility];  Service: General;  Laterality: Right;   IR ANGIO INTRA EXTRACRAN SEL INTERNAL CAROTID BILAT MOD SED  05/14/2020   IR ANGIO VERTEBRAL SEL VERTEBRAL BILAT MOD SED  05/14/2020   IR GUIDED DRAIN W CATHETER PLACEMENT  02/05/2021   IR RADIOLOGIST EVAL & MGMT  02/13/2021   LOOP RECORDER INSERTION  06/2020   last remote check was on 03/12/21   MELANOMA EXCISION  07/28/2011   Dr Jadine; stomach and chest   REMOVAL OF STONES  12/07/2018   Procedure: REMOVAL OF STONES;  Surgeon: Wilhelmenia Aloha Raddle., MD;  Location: [facility];  Service:  Gastroenterology;;   TONSILLECTOMY  1951   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   UPPER GASTROINTESTINAL ENDOSCOPY  1983   hiatal hernia   VASECTOMY  1979    Review of Systems:    All systems reviewed and negative except where noted in HPI.    Physical Exam:  There were no vitals taken for this visit. No LMP for male patient.  General: Well-nourished, well-developed in no acute distress.  Lungs: Clear to auscultation bilaterally. Non-labored. Heart: Regular rate and rhythm, no murmurs rubs or gallops.  Abdomen: Bowel sounds are normal; Abdomen is Soft; No hepatosplenomegaly, masses or hernias;  No Abdominal Tenderness; No guarding or rebound tenderness. Neuro: Alert and oriented x 3.  Grossly intact.  Psych: Alert and cooperative, normal mood and affect.   Imaging Studies: CUP PACEART REMOTE DEVICE CHECK Result Date: 09/30/2023  ILR summary report received. Battery status OK. Normal device function. No new symptom, tachy, brady, or pause episodes. No new AF episodes. Monthly summary reports and ROV/PRN. [facility], [facility]   Labs: CBC    Component Value Date/Time   WBC 6.4 03/01/2023 0926   RBC 4.15 (L) 03/01/2023 0926   HGB 13.7 03/01/2023 0926   HGB 13.6 10/15/2020 1121   HCT 40.6 03/01/2023 0926   PLT 180.0 03/01/2023 0926   PLT 195 10/15/2020 1121   MCV 97.9 03/01/2023 0926   MCV 103.1 (A) 01/10/2013 0839   MCH 34.9 (H) 04/04/2021 1029   MCHC 33.7 03/01/2023 0926   RDW 13.9 03/01/2023 0926   LYMPHSABS 1.4 03/01/2023 0926   MONOABS 0.4 03/01/2023 0926   EOSABS 0.2 03/01/2023 0926   BASOSABS 0.0 03/01/2023 0926    CMP     Component Value Date/Time   NA 140 08/30/2023 0832   NA 140 07/17/2015 0000   K 4.5 08/30/2023 0832   CL 104 08/30/2023 0832   CO2 28 08/30/2023 0832   GLUCOSE 110 (H) 08/30/2023 0832   BUN 16 08/30/2023 0832   BUN 9 07/17/2015 0000   CREATININE 1.50 08/30/2023 0832   CREATININE 1.28 (H) 10/15/2020 1121   CALCIUM  9.2 08/30/2023 0832    PROT 6.1 08/30/2023 0832   ALBUMIN 4.2 08/30/2023 0832   AST 18 08/30/2023 0832   AST 24 10/15/2020 1121   ALT 11 08/30/2023 0832   ALT 33 10/15/2020 1121   ALKPHOS 60 08/30/2023 0832   BILITOT 0.7 08/30/2023 0832   BILITOT 0.8 10/15/2020 1121   GFRNONAA >60 04/04/2021 1029   GFRNONAA 59 (L) 10/15/2020 1121   GFRAA >60 03/21/2019 0929       Assessment and Plan:   VA BROADWELL is a 73 y.o. y/o male presents for follow-up of:  1.  Barrett's esophagus - Scheduling EGD I discussed risks of EGD with patient to include risk of bleeding, perforation, and risk of sedation.  Patient expressed understanding and agrees to proceed with EGD.   2.  GERD - Continue Nexium  40 Mg once daily.  3.  Colon cancer screening - Scheduling Colonoscopy I discussed risks of colonoscopy with patient to include risk of bleeding, colon perforation, and risk of sedation.  Patient expressed understanding and agrees to proceed with colonoscopy.   4.  Comorbidities:  Hypertension, GERD, bipolar disorder, history of stroke, subarachnoid hemorrhage (04/2020), currently on Plavix  daily.  Echo 04/2020 showed LVEF 55 to 60%. - Request permission to hold Plavix  5 days prior to EGD and colonoscopy procedures.    Ellouise Console, PA-C  Follow up ***

## 2023-10-25 ENCOUNTER — Telehealth: Payer: Self-pay

## 2023-10-25 ENCOUNTER — Ambulatory Visit: Admitting: Physician Assistant

## 2023-10-25 ENCOUNTER — Encounter: Payer: Self-pay | Admitting: Physician Assistant

## 2023-10-25 VITALS — BP 124/78 | HR 60 | Ht 73.0 in | Wt 217.0 lb

## 2023-10-25 DIAGNOSIS — Z1211 Encounter for screening for malignant neoplasm of colon: Secondary | ICD-10-CM

## 2023-10-25 DIAGNOSIS — Z8673 Personal history of transient ischemic attack (TIA), and cerebral infarction without residual deficits: Secondary | ICD-10-CM | POA: Diagnosis not present

## 2023-10-25 DIAGNOSIS — K2270 Barrett's esophagus without dysplasia: Secondary | ICD-10-CM

## 2023-10-25 DIAGNOSIS — K219 Gastro-esophageal reflux disease without esophagitis: Secondary | ICD-10-CM

## 2023-10-25 MED ORDER — NA SULFATE-K SULFATE-MG SULF 17.5-3.13-1.6 GM/177ML PO SOLN
1.0000 | Freq: Once | ORAL | 0 refills | Status: AC
Start: 2023-10-25 — End: 2023-10-25

## 2023-10-25 NOTE — Telephone Encounter (Signed)
  Jeffrey Acosta [birth year] [id number]  @DATE @   Dear Rosemarie Eather RAMAN, MD:  We have scheduled the above named patient for a(n) Colonoscopy and Endoscopy procedure. Our records show that (s)he is on anticoagulation therapy.  Please advise as to whether the patient may come off their therapy of Plavix   5 days prior to their procedure which is scheduled for 12/01/2023.  Please route your response to Alethea Blocker, CMA or fax response to [phone].  Sincerely,    [facility]

## 2023-10-25 NOTE — Progress Notes (Signed)
 Noted

## 2023-10-25 NOTE — Patient Instructions (Addendum)
 You have been scheduled for an endoscopy and colonoscopy. Please follow the written instructions given to you at your visit today.  If you use inhalers (even only as needed), please bring them with you on the day of your procedure.  DO NOT TAKE 7 DAYS PRIOR TO TEST- Trulicity (dulaglutide) Ozempic, Wegovy (semaglutide) Mounjaro (tirzepatide) Bydureon Bcise (exanatide extended release)  DO NOT TAKE 1 DAY PRIOR TO YOUR TEST Rybelsus (semaglutide) Adlyxin (lixisenatide) Victoza (liraglutide) Byetta (exanatide) ___________________________________________________________________________  We have sent the following medications to your pharmacy for you to pick up at your convenience: Suprep  You will be contacted by our office prior to your procedure for directions on holding your Plavix .  If you do not hear from our office 1 week prior to your scheduled procedure, please call [phone] to discuss.    If your blood pressure at your visit was 140/90 or greater, please contact your primary care physician to follow up on this.  _______________________________________________________  If you are age 5 or older, your body mass index should be between 23-30. Your Body mass index is 28.63 kg/m. If this is out of the aforementioned range listed, please consider follow up with your Primary Care Provider.  If you are age 50 or younger, your body mass index should be between 19-25. Your Body mass index is 28.63 kg/m. If this is out of the aformentioned range listed, please consider follow up with your Primary Care Provider.   ________________________________________________________  The [facility] providers would like to encourage you to use MYCHART to communicate with providers for non-urgent requests or questions.  Due to long hold times on the telephone, sending your provider a message by [facility] may be a faster and more efficient way to get a response.  Please allow 48 business hours for a  response.  Please remember that this is for non-urgent requests.  _______________________________________________________  Due to recent changes in healthcare laws, you may see the results of your imaging and laboratory studies on MyChart before your provider has had a chance to review them.  We understand that in some cases there may be results that are confusing or concerning to you. Not all laboratory results come back in the same time frame and the provider may be waiting for multiple results in order to interpret others.  Please give us  48 hours in order for your provider to thoroughly review all the results before contacting the office for clarification of your results.   Thank you for entrusting me with your care and choosing [facility].  Ellouise Console PA-C

## 2023-11-01 ENCOUNTER — Ambulatory Visit

## 2023-11-01 DIAGNOSIS — I639 Cerebral infarction, unspecified: Secondary | ICD-10-CM | POA: Diagnosis not present

## 2023-11-01 LAB — CUP PACEART REMOTE DEVICE CHECK
Date Time Interrogation Session: 20250706231318
Implantable Pulse Generator Implant Date: 20220502

## 2023-11-05 DIAGNOSIS — H401131 Primary open-angle glaucoma, bilateral, mild stage: Secondary | ICD-10-CM | POA: Diagnosis not present

## 2023-11-08 ENCOUNTER — Ambulatory Visit: Payer: Self-pay | Admitting: Cardiovascular Disease

## 2023-11-10 ENCOUNTER — Other Ambulatory Visit: Payer: Self-pay | Admitting: Internal Medicine

## 2023-11-17 ENCOUNTER — Telehealth: Payer: Self-pay | Admitting: Internal Medicine

## 2023-11-17 NOTE — Telephone Encounter (Signed)
 Copied from CRM #[id number]. Topic: Medicare AWV >> Nov 17, 2023  1:23 PM Nathanel DEL wrote: Reason for CRM: Called LVM 11/17/2023 to schedule AWV. Please schedule Virtual or Telehealth visits ONLY  Nathanel Paschal; Care Guide Ambulatory Clinical Support [place] [facility] Direct Dial: [phone]

## 2023-11-18 NOTE — Progress Notes (Signed)
 [facility] Summary Report / Loop Recorder

## 2023-11-27 ENCOUNTER — Other Ambulatory Visit: Payer: Self-pay | Admitting: Internal Medicine

## 2023-11-30 DIAGNOSIS — H4010X1 Unspecified open-angle glaucoma, mild stage: Secondary | ICD-10-CM | POA: Diagnosis not present

## 2023-11-30 DIAGNOSIS — H401131 Primary open-angle glaucoma, bilateral, mild stage: Secondary | ICD-10-CM | POA: Diagnosis not present

## 2023-12-01 ENCOUNTER — Encounter: Payer: Self-pay | Admitting: Internal Medicine

## 2023-12-01 ENCOUNTER — Ambulatory Visit: Admitting: Internal Medicine

## 2023-12-01 VITALS — BP 133/76 | HR 62 | Temp 97.6°F | Resp 20 | Ht 72.0 in | Wt 217.0 lb

## 2023-12-01 DIAGNOSIS — K219 Gastro-esophageal reflux disease without esophagitis: Secondary | ICD-10-CM

## 2023-12-01 DIAGNOSIS — Z1211 Encounter for screening for malignant neoplasm of colon: Secondary | ICD-10-CM | POA: Diagnosis not present

## 2023-12-01 DIAGNOSIS — K449 Diaphragmatic hernia without obstruction or gangrene: Secondary | ICD-10-CM

## 2023-12-01 DIAGNOSIS — K209 Esophagitis, unspecified without bleeding: Secondary | ICD-10-CM

## 2023-12-01 DIAGNOSIS — K635 Polyp of colon: Secondary | ICD-10-CM

## 2023-12-01 DIAGNOSIS — K5730 Diverticulosis of large intestine without perforation or abscess without bleeding: Secondary | ICD-10-CM | POA: Diagnosis not present

## 2023-12-01 DIAGNOSIS — K2270 Barrett's esophagus without dysplasia: Secondary | ICD-10-CM

## 2023-12-01 DIAGNOSIS — K6389 Other specified diseases of intestine: Secondary | ICD-10-CM | POA: Diagnosis not present

## 2023-12-01 DIAGNOSIS — D125 Benign neoplasm of sigmoid colon: Secondary | ICD-10-CM

## 2023-12-01 MED ORDER — SODIUM CHLORIDE 0.9 % IV SOLN: 500.0000 mL | Freq: Once | INTRAVENOUS | Status: DC

## 2023-12-01 NOTE — Progress Notes (Signed)
 Pt's states no medical or surgical changes since previsit or office visit.

## 2023-12-01 NOTE — Progress Notes (Signed)
 Sedate, gd SR, tolerated procedure well, VSS, report to RN

## 2023-12-01 NOTE — Op Note (Signed)
 [facility] Patient Name: Jeffrey Acosta Procedure Date: 12/01/2023 10:00 AM MRN: [id number] Endoscopist: Norleen SAILOR. Abran , MD, [id number] Age: 73 Referring MD:  Date of Birth: Mar 21, 1951 Gender: Male Account #: [id number] Procedure:                Upper GI endoscopy with biopsies Indications:              Surveillance for malignancy due to personal history                            of Barrett's esophagus. Previous examination 2022 Medicines:                Monitored Anesthesia Care Procedure:                Pre-Anesthesia Assessment:                           - Prior to the procedure, a History and Physical                            was performed, and patient medications and                            allergies were reviewed. The patient's tolerance of                            previous anesthesia was also reviewed. The risks                            and benefits of the procedure and the sedation                            options and risks were discussed with the patient.                            All questions were answered, and informed consent                            was obtained. Prior Anticoagulants: The patient has                            taken Plavix  (clopidogrel ), last dose was 5 days                            prior to procedure. ASA Grade Assessment: III - A                            patient with severe systemic disease. After                            reviewing the risks and benefits, the patient was                            deemed in satisfactory condition to undergo the  procedure.                           After obtaining informed consent, the endoscope was                            passed under direct vision. Throughout the                            procedure, the patient's blood pressure, pulse, and                            oxygen saturations were monitored continuously. The                            Olympus Scope  SN [id number] was introduced through the                            mouth, and advanced to the second part of duodenum.                            The upper GI endoscopy was accomplished without                            difficulty. The patient tolerated the procedure                            well. Scope In: Scope Out: Findings:                 The esophagus revealed evidence of Barrett's                            esophagus in the distalmost portion (C4, M6). There                            were multiple squamous islands throughout                            (secondary to previous surveillance biopsy). The                            esophagus was examined under and high magnification                            as well as white light and NBI. No active                            inflammation. No nodularity or other                            irregularities. Multiple biopsies taken.                           The stomach revealed a small hiatal hernia.  Otherwise normal.                           The examined duodenum was normal.                           The cardia and gastric fundus were normal on                            retroflexion. Complications:            No immediate complications. Estimated Blood Loss:     Estimated blood loss: none. Impression:               1. Long segment Barrett's esophagus status post                            surveillance biopsies                           2. GERD                           3. Otherwise unremarkable EGD. Recommendation:           - Patient has a contact number available for                            emergencies. The signs and symptoms of potential                            delayed complications were discussed with the                            patient. Return to normal activities tomorrow.                            Written discharge instructions were provided to the                            patient.                            - Resume previous diet.                           - Continue present medications.                           - Await pathology results.                           - Resume Plavix  today                           - If no dysplasia on biopsies, consider repeat                            surveillance upper endoscopy in 5 years (based  on                            overall health). Norleen SAILOR. Abran, MD 12/01/2023 10:55:43 AM This report has been signed electronically.

## 2023-12-01 NOTE — Progress Notes (Signed)
 Called to room to assist during endoscopic procedure.  Patient ID and intended procedure confirmed with present staff. Received instructions for my participation in the procedure from the performing physician.

## 2023-12-01 NOTE — Patient Instructions (Addendum)
 YOU HAD AN ENDOSCOPIC PROCEDURE TODAY AT THE [facility]:   Refer to the procedure report that was given to you for any specific questions about what was found during the examination.  If the procedure report does not answer your questions, please call your gastroenterologist to clarify.  If you requested that your care partner not be given the details of your procedure findings, then the procedure report has been included in a sealed envelope for you to review at your convenience later.  YOU SHOULD EXPECT: Some feelings of bloating in the abdomen. Passage of more gas than usual.  Walking can help get rid of the air that was put into your GI tract during the procedure and reduce the bloating. If you had a lower endoscopy (such as a colonoscopy or flexible sigmoidoscopy) you may notice spotting of blood in your stool or on the toilet paper. If you underwent a bowel prep for your procedure, you may not have a normal bowel movement for a few days.  Please Note:  You might notice some irritation and congestion in your nose or some drainage.  This is from the oxygen used during your procedure.  There is no need for concern and it should clear up in a day or so.  SYMPTOMS TO REPORT IMMEDIATELY:  Following lower endoscopy (colonoscopy or flexible sigmoidoscopy):  Excessive amounts of blood in the stool  Significant tenderness or worsening of abdominal pains  Swelling of the abdomen that is new, acute  Fever of 100F or higher  Following upper endoscopy (EGD)  Vomiting of blood or coffee ground material  New chest pain or pain under the shoulder blades  Painful or persistently difficult swallowing  New shortness of breath  Fever of 100F or higher  Black, tarry-looking stools  Colonoscopy Resume previous diet Continue present medications Await pathology results  Endoscopy Resume previous diet Continue present medicationsAwait pathology results Resume Plavix  today  For urgent or  emergent issues, a gastroenterologist can be reached at any hour by calling [phone]. Do not use MyChart messaging for urgent concerns.    DIET:  We do recommend a small meal at first, but then you may proceed to your regular diet.  Drink plenty of fluids but you should avoid alcoholic beverages for 24 hours.  ACTIVITY:  You should plan to take it easy for the rest of today and you should NOT DRIVE or use heavy machinery until tomorrow (because of the sedation medicines used during the test).    FOLLOW UP: Our staff will call the number listed on your records the next business day following your procedure.  We will call around 7:15- 8:00 am to check on you and address any questions or concerns that you may have regarding the information given to you following your procedure. If we do not reach you, we will leave a message.     If any biopsies were taken you will be contacted by phone or by letter within the next 1-3 weeks.  Please call us  at [phone] if you have not heard about the biopsies in 3 weeks.    SIGNATURES/CONFIDENTIALITY: You and/or your care partner have signed paperwork which will be entered into your electronic medical record.  These signatures attest to the fact that that the information above on your After Visit Summary has been reviewed and is understood.  Full responsibility of the confidentiality of this discharge information lies with you and/or your care-partner.

## 2023-12-01 NOTE — Progress Notes (Signed)
 Expand All Collapse All       Ellouise Console, PA-C [address] [place], [place]  [address] Phone: [phone]     Primary Care Physician: Amon Aloysius BRAVO, MD   Primary Gastroenterologist:  Ellouise Console, PA-C / Norleen Kiang, MD    Chief Complaint:  Discuss Colon; Barrett's, GERD         HPI:   Jeffrey Acosta is a 73 y.o. male presents to discuss scheduling EGD and colonoscopy.   09/2020 last EGD by Dr. Kiang for surveillance of Barrett's: Barrett's mucosa involving the distal 6 cm of the esophagus.  Stomach and duodenum normal.  3-year repeat (currently due).   10/2013 last colonoscopy by Dr. Kiang: Moderate left-sided diverticulosis, otherwise normal.  No polyps.  10-year repeat (currently due).   Current symptoms: He is currently taking Nexium  40 Mg once daily with good control of acid reflux.  He has heartburn if he misses one dose of Nexium .  He denies dysphagia, abdominal pain, diarrhea, constipation, or rectal bleeding.  He has no health concerns today.   PMH: Hypertension, GERD, bipolar disorder, history of alcohol abuse in remission, history of stroke 04/2020 (recovered), subarachnoid hemorrhage (04/2020), currently on Plavix  daily.  Echo 04/2020 showed LVEF 55 to 60%.         Current Outpatient Medications  Medication Sig Dispense Refill   allopurinol  (ZYLOPRIM ) 300 MG tablet Take 1 tablet (300 mg total) by mouth daily. 90 tablet 1   atorvastatin  (LIPITOR) 20 MG tablet Take 1 tablet (20 mg total) by mouth at bedtime. 90 tablet 1   Cholecalciferol (VITAMIN D ) 50 MCG (2000 UT) tablet Take 2,000 Units by mouth daily.       clopidogrel  (PLAVIX ) 75 MG tablet Take 1 tablet (75 mg total) by mouth daily. 90 tablet 1   esomeprazole  (NEXIUM ) 40 MG capsule TAKE 1 CAPSULE(40 MG) BY MOUTH DAILY BEFORE BREAKFAST 90 capsule 1   fluticasone  (FLONASE ) 50 MCG/ACT nasal spray Place 2 sprays into both nostrils daily as needed for congestion.       L-Methylfolate-B12-B6-B2 (CEREFOLIN) 09-25-48-5 MG  TABS TAKE 1 TABLET BY MOUTH EVERY MORNING 90 tablet 1   lamoTRIgine  (LAMICTAL ) 25 MG tablet Take 25 mg by mouth daily.       levothyroxine  (SYNTHROID ) 75 MCG tablet Take 1 tablet (75 mcg total) by mouth daily before breakfast. 90 tablet 1   metoprolol  succinate (TOPROL -XL) 100 MG 24 hr tablet Take 1.5 tablets (150 mg total) by mouth daily. Take with or immediately following a meal 135 tablet 1   naltrexone (DEPADE) 50 MG tablet Take 50 mg by mouth daily.    12   QUEtiapine  (SEROQUEL ) 400 MG tablet Take 800 mg by mouth at bedtime.       Thiamine  HCl (VITAMIN B-1) 250 MG tablet Take 1 tablet (250 mg total) by mouth daily. 90 tablet 3   timolol (TIMOPTIC) 0.5 % ophthalmic solution Place 1 drop into both eyes 2 (two) times daily.          No current facility-administered medications for this visit.             Allergies as of 10/25/2023 - Review Complete 10/25/2023  Allergen Reaction Noted   Iohexol  Hives 11/14/2004          Past Medical History:  Diagnosis Date   Alcoholism in remission (HCC)      in AA   Arrhythmia      atrial tachycardia   Bipolar affective disorder (  HCC)     Depression     Diverticulosis of colon (without mention of hemorrhage)     GERD (gastroesophageal reflux disease)     Gout of multiple sites     Hiatal hernia     Hx of migraines      migraines- last one since 9-10 yrs ago   Hydrocele     HYPERLIPIDEMIA 01/10/2009    Qualifier: Diagnosis of  By: Joshua CMA, Chemira    NMR Lipoprofile 2011 LDL could not be calculated  Due to TG of 889  (811  / 750 ),  HDL 50 . Father MI @ 45 PGM CVA early 69s; PGF CVA @72     Hypothyroidism     Melanoma of skin, site unspecified 11/25/2012    07/2011 abdominal  Stage 1 Dr Amy [place] Dr Jadine and chest   Osteopenia     Polyarthralgia     Status post placement of implantable loop recorder 08/26/2020   Stroke (HCC)      x 3 - last one 04/2020   Subarachnoid hemorrhage [facility])                 Past Surgical History:   Procedure Laterality Date   BILIARY BRUSHING   12/07/2018    Procedure: BILIARY BRUSHING;  Surgeon: Wilhelmenia Aloha Raddle., MD;  Location: [facility];  Service: Gastroenterology;;   BILIARY DILATION   12/07/2018    Procedure: BILIARY DILATION;  Surgeon: Wilhelmenia Aloha Raddle., MD;  Location: [facility];  Service: Gastroenterology;;   BIOPSY   12/07/2018    Procedure: BIOPSY;  Surgeon: Wilhelmenia Aloha Raddle., MD;  Location: [facility];  Service: Gastroenterology;;   CHOLECYSTECTOMY N/A 01/29/2021    Procedure: LAPAROSCOPIC CHOLECYSTECTOMY WITH ICG DYE;  Surgeon: Ebbie Cough, MD;  Location: [facility] OR;  Service: General;  Laterality: N/A;   CLOSED REDUCTION FINGER WITH PERCUTANEOUS PINNING Right 04/04/2021    Procedure: CLOSED REDUCTION WITH PERCUTANEOUS PINNING RIGHT THUMB;  Surgeon: Romona Harari, MD;  Location: [facility] OR;  Service: Orthopedics;  Laterality: Right;   COLONOSCOPY   2003    negative ; Dr Jakie   ERCP N/A 12/07/2018    Procedure: ENDOSCOPIC RETROGRADE CHOLANGIOPANCREATOGRAPHY (ERCP);  Surgeon: Wilhelmenia Aloha Raddle., MD;  Location: [facility];  Service: Gastroenterology;  Laterality: N/A;   ESOPHAGOGASTRODUODENOSCOPY (EGD) WITH PROPOFOL  N/A 12/07/2018    Procedure: ESOPHAGOGASTRODUODENOSCOPY (EGD) WITH PROPOFOL ;  Surgeon: Wilhelmenia Aloha Raddle., MD;  Location: [facility];  Service: Gastroenterology;  Laterality: N/A;   EUS N/A 12/07/2018    Procedure: UPPER ENDOSCOPIC ULTRASOUND (EUS) RADIAL;  Surgeon: Wilhelmenia Aloha Raddle., MD;  Location: [facility];  Service: Gastroenterology;  Laterality: N/A;   INGUINAL HERNIA REPAIR   08/07/2011    Procedure: HERNIA REPAIR INGUINAL ADULT;  Surgeon: Cough Ebbie, MD;  Location: [facility];  Service: General;  Laterality: Right;   IR ANGIO INTRA EXTRACRAN SEL INTERNAL CAROTID BILAT MOD SED   05/14/2020   IR ANGIO VERTEBRAL SEL VERTEBRAL BILAT MOD SED   05/14/2020   IR GUIDED DRAIN W CATHETER  PLACEMENT   02/05/2021   IR RADIOLOGIST EVAL & MGMT   02/13/2021   LOOP RECORDER INSERTION   06/2020    last remote check was on 03/12/21   MELANOMA EXCISION   07/28/2011    Dr Jadine; stomach and chest   REMOVAL OF STONES   12/07/2018    Procedure: REMOVAL OF STONES;  Surgeon: Wilhelmenia Aloha Raddle., MD;  Location: [facility];  Service: Gastroenterology;;   TONSILLECTOMY  1951   Undescended testicle surgery   1950    as infant   UPPER GASTROINTESTINAL ENDOSCOPY   1983    hiatal hernia   VASECTOMY   1979          Review of Systems:    All systems reviewed and negative except where noted in HPI.      Physical Exam:  BP 124/78   Pulse 60   Ht 6' 1 (1.854 m)   Wt 217 lb (98.4 kg)   SpO2 95%   BMI 28.63 kg/m  No LMP for male patient.   General: Well-nourished, well-developed in no acute distress.  Lungs: Clear to auscultation bilaterally. Non-labored. Heart: Regular rate and rhythm, no murmurs rubs or gallops.  Abdomen: Bowel sounds are normal; Abdomen is Soft; No hepatosplenomegaly, masses or hernias;  No Abdominal Tenderness; No guarding or rebound tenderness. Neuro: Alert and oriented x 3.  Grossly intact.  Psych: Alert and cooperative, normal mood and affect.     Imaging Studies:  Imaging Results  CUP PACEART REMOTE DEVICE CHECK Result Date: 09/30/2023 ILR summary report received. Battery status OK. Normal device function. No new symptom, tachy, brady, or pause episodes. No new AF episodes. Monthly summary reports and ROV/PRN. [facility], [facility]      Labs: CBC Labs (Brief)          Component Value Date/Time    WBC 6.4 03/01/2023 0926    RBC 4.15 (L) 03/01/2023 0926    HGB 13.7 03/01/2023 0926    HGB 13.6 10/15/2020 1121    HCT 40.6 03/01/2023 0926    PLT 180.0 03/01/2023 0926    PLT 195 10/15/2020 1121    MCV 97.9 03/01/2023 0926    MCV 103.1 (A) 01/10/2013 0839    MCH 34.9 (H) 04/04/2021 1029    MCHC 33.7 03/01/2023 0926    RDW 13.9 03/01/2023 0926     LYMPHSABS 1.4 03/01/2023 0926    MONOABS 0.4 03/01/2023 0926    EOSABS 0.2 03/01/2023 0926    BASOSABS 0.0 03/01/2023 0926        CMP     Labs (Brief)          Component Value Date/Time    NA 140 08/30/2023 0832    NA 140 07/17/2015 0000    K 4.5 08/30/2023 0832    CL 104 08/30/2023 0832    CO2 28 08/30/2023 0832    GLUCOSE 110 (H) 08/30/2023 0832    BUN 16 08/30/2023 0832    BUN 9 07/17/2015 0000    CREATININE 1.50 08/30/2023 0832    CREATININE 1.28 (H) 10/15/2020 1121    CALCIUM  9.2 08/30/2023 0832    PROT 6.1 08/30/2023 0832    ALBUMIN 4.2 08/30/2023 0832    AST 18 08/30/2023 0832    AST 24 10/15/2020 1121    ALT 11 08/30/2023 0832    ALT 33 10/15/2020 1121    ALKPHOS 60 08/30/2023 0832    BILITOT 0.7 08/30/2023 0832    BILITOT 0.8 10/15/2020 1121    GFRNONAA >60 04/04/2021 1029    GFRNONAA 59 (L) 10/15/2020 1121    GFRAA >60 03/21/2019 0929        Assessment and Plan:    Jeffrey Acosta is a 73 y.o. y/o male presents for follow-up of:   1.  Barrett's esophagus - Scheduling EGD I discussed risks of EGD with patient to include risk of bleeding, perforation, and risk of sedation.  Patient expressed understanding and agrees  to proceed with EGD.    2.  GERD - Controlled on PPI - Continue Nexium  40 Mg once daily.   3.  Colon cancer screening: Currently due for 10-year repeat screening colonoscopy. - Scheduling Colonoscopy I discussed risks of colonoscopy with patient to include risk of bleeding, colon perforation, and risk of sedation.  Patient expressed understanding and agrees to proceed with colonoscopy.    4.  Comorbidities:  Hypertension, GERD, bipolar disorder, history of stroke, subarachnoid hemorrhage (04/2020), currently on Plavix  daily.  Echo 04/2020 showed LVEF 55 to 60%. - We sent request for permission to hold Plavix  5 days prior to EGD and colonoscopy procedures to neurologist Dr. Rosemarie.   Ellouise Console, PA-C   Follow up Based on EGD and Colonoscopy  Results.      Recent H&P as above.  No interval change.  Now for colonoscopy and upper endoscopy.

## 2023-12-01 NOTE — Op Note (Signed)
 [facility] Patient Name: Jeffrey Acosta Procedure Date: 12/01/2023 10:00 AM MRN: [id number] Endoscopist: Norleen SAILOR. Abran , MD, [id number] Age: 73 Referring MD:  Date of Birth: 10-Nov-1950 Gender: Male Account #: [id number] Procedure:                Colonoscopy with cold snare polypectomy x 1 Indications:              Screening for colorectal malignant neoplasm Medicines:                Monitored Anesthesia Care Procedure:                Pre-Anesthesia Assessment:                           - Prior to the procedure, a History and Physical                            was performed, and patient medications and                            allergies were reviewed. The patient's tolerance of                            previous anesthesia was also reviewed. The risks                            and benefits of the procedure and the sedation                            options and risks were discussed with the patient.                            All questions were answered, and informed consent                            was obtained. Prior Anticoagulants: The patient has                            taken Plavix  (clopidogrel ), last dose was 5 days                            prior to procedure. ASA Grade Assessment: III - A                            patient with severe systemic disease. After                            reviewing the risks and benefits, the patient was                            deemed in satisfactory condition to undergo the                            procedure.  After obtaining informed consent, the colonoscope                            was passed under direct vision. Throughout the                            procedure, the patient's blood pressure, pulse, and                            oxygen saturations were monitored continuously. The                            Olympus Scope SN [id number] was introduced through the                            anus and  advanced to the the cecum, identified by                            appendiceal orifice and ileocecal valve. The                            ileocecal valve, appendiceal orifice, and rectum                            were photographed. The quality of the bowel                            preparation was excellent. The colonoscopy was                            performed without difficulty. The patient tolerated                            the procedure well. The bowel preparation used was                            SUPREP via split dose instruction. Scope In: 10:13:23 AM Scope Out: 10:29:41 AM Scope Withdrawal Time: 0 hours 12 minutes 20 seconds  Total Procedure Duration: 0 hours 16 minutes 18 seconds  Findings:                 A 3 mm polyp was found in the sigmoid colon. The                            polyp was sessile. The polyp was removed with a                            cold snare. Resection and retrieval were complete.                           Multiple diverticula were found in the left colon.                           The exam  was otherwise without abnormality on                            direct and retroflexion views. Complications:            No immediate complications. Estimated blood loss:                            None. Estimated Blood Loss:     Estimated blood loss: none. Impression:               - One 3 mm polyp in the sigmoid colon, removed with                            a cold snare. Resected and retrieved.                           - Diverticulosis in the left colon.                           - The examination was otherwise normal on direct                            and retroflexion views. Recommendation:           - Repeat colonoscopy is not recommended for                            surveillance.                           - Resume Plavix  (clopidogrel ) today at prior dose.                           - Patient has a contact number available for                             emergencies. The signs and symptoms of potential                            delayed complications were discussed with the                            patient. Return to normal activities tomorrow.                            Written discharge instructions were provided to the                            patient.                           - Resume previous diet.                           - Continue present medications.                           -  Await pathology results. Norleen SAILOR. Abran, MD 12/01/2023 10:37:12 AM This report has been signed electronically.

## 2023-12-02 ENCOUNTER — Ambulatory Visit

## 2023-12-02 ENCOUNTER — Telehealth: Payer: Self-pay | Admitting: *Deleted

## 2023-12-02 ENCOUNTER — Ambulatory Visit: Payer: Self-pay | Admitting: Cardiovascular Disease

## 2023-12-02 DIAGNOSIS — I639 Cerebral infarction, unspecified: Secondary | ICD-10-CM | POA: Diagnosis not present

## 2023-12-02 LAB — CUP PACEART REMOTE DEVICE CHECK
Date Time Interrogation Session: 20250806231047
Implantable Pulse Generator Implant Date: 20220502

## 2023-12-02 NOTE — Telephone Encounter (Signed)
  Follow up Call-     12/01/2023    9:08 AM  Call back number  Post procedure Call Back phone  # [phone]  Permission to leave phone message Yes   Left message to call back if any questions or concerns

## 2023-12-06 ENCOUNTER — Ambulatory Visit: Payer: Self-pay | Admitting: Internal Medicine

## 2023-12-06 LAB — SURGICAL PATHOLOGY

## 2023-12-18 ENCOUNTER — Other Ambulatory Visit: Payer: Self-pay | Admitting: Internal Medicine

## 2024-01-03 ENCOUNTER — Ambulatory Visit: Payer: Self-pay | Admitting: Cardiovascular Disease

## 2024-01-03 ENCOUNTER — Ambulatory Visit

## 2024-01-03 ENCOUNTER — Ambulatory Visit: Admitting: Internal Medicine

## 2024-01-03 DIAGNOSIS — I639 Cerebral infarction, unspecified: Secondary | ICD-10-CM | POA: Diagnosis not present

## 2024-01-03 LAB — CUP PACEART REMOTE DEVICE CHECK
Date Time Interrogation Session: 20250906231502
Implantable Pulse Generator Implant Date: 20220502

## 2024-01-04 DIAGNOSIS — H53143 Visual discomfort, bilateral: Secondary | ICD-10-CM | POA: Diagnosis not present

## 2024-01-04 DIAGNOSIS — H401131 Primary open-angle glaucoma, bilateral, mild stage: Secondary | ICD-10-CM | POA: Diagnosis not present

## 2024-01-12 ENCOUNTER — Encounter: Payer: Self-pay | Admitting: Internal Medicine

## 2024-01-12 ENCOUNTER — Ambulatory Visit (INDEPENDENT_AMBULATORY_CARE_PROVIDER_SITE_OTHER): Admitting: Internal Medicine

## 2024-01-12 VITALS — BP 138/82 | HR 56 | Temp 97.7°F | Resp 16 | Ht 73.0 in | Wt 218.2 lb

## 2024-01-12 DIAGNOSIS — E785 Hyperlipidemia, unspecified: Secondary | ICD-10-CM | POA: Diagnosis not present

## 2024-01-12 DIAGNOSIS — F1021 Alcohol dependence, in remission: Secondary | ICD-10-CM | POA: Diagnosis not present

## 2024-01-12 DIAGNOSIS — E039 Hypothyroidism, unspecified: Secondary | ICD-10-CM | POA: Diagnosis not present

## 2024-01-12 DIAGNOSIS — I6930 Unspecified sequelae of cerebral infarction: Secondary | ICD-10-CM

## 2024-01-12 DIAGNOSIS — M1000 Idiopathic gout, unspecified site: Secondary | ICD-10-CM

## 2024-01-12 DIAGNOSIS — Z23 Encounter for immunization: Secondary | ICD-10-CM | POA: Diagnosis not present

## 2024-01-12 DIAGNOSIS — F319 Bipolar disorder, unspecified: Secondary | ICD-10-CM

## 2024-01-12 NOTE — Progress Notes (Signed)
 Subjective:    Patient ID: Jeffrey Acosta, male    DOB: [birth year], 73 y.o.   MRN: [id number]  DOS:  01/12/2024 Type of visit - description: Follow-up  Chronic medical problems addressed, states he feels great.  Review of Systems See above   Past Medical History:  Diagnosis Date   Alcoholism in remission (HCC)    in AA   Arrhythmia    atrial tachycardia   Bipolar affective disorder (HCC)    Depression    Diverticulosis of colon (without mention of hemorrhage)    GERD (gastroesophageal reflux disease)    Gout of multiple sites    Hiatal hernia    Hx of migraines    migraines- last one since 9-10 yrs ago   Hydrocele    HYPERLIPIDEMIA 01/10/2009   Qualifier: Diagnosis of  By: Joshua CMA, Chemira    NMR Lipoprofile 2011 LDL could not be calculated  Due to TG of 889  (811  / 750 ),  HDL 50 . Father MI @ 81 PGM CVA early 49s; PGF CVA @72     Hypothyroidism    Melanoma of skin, site unspecified 11/25/2012   07/2011 abdominal  Stage 1 Dr Amy [place] Dr Jadine and chest   Osteopenia    Polyarthralgia    Status post placement of implantable loop recorder 08/26/2020   Stroke (HCC)    x 3 - last one 04/2020   Subarachnoid hemorrhage [facility])     Past Surgical History:  Procedure Laterality Date   BILIARY BRUSHING  12/07/2018   Procedure: BILIARY BRUSHING;  Surgeon: Wilhelmenia Aloha Raddle., MD;  Location: [facility];  Service: Gastroenterology;;   BILIARY DILATION  12/07/2018   Procedure: BILIARY DILATION;  Surgeon: Wilhelmenia Aloha Raddle., MD;  Location: [facility];  Service: Gastroenterology;;   BIOPSY  12/07/2018   Procedure: BIOPSY;  Surgeon: Wilhelmenia Aloha Raddle., MD;  Location: [facility];  Service: Gastroenterology;;   CHOLECYSTECTOMY N/A 01/29/2021   Procedure: LAPAROSCOPIC CHOLECYSTECTOMY WITH ICG DYE;  Surgeon: Ebbie Cough, MD;  Location: [facility] OR;  Service: General;  Laterality: N/A;   CLOSED REDUCTION FINGER WITH PERCUTANEOUS PINNING Right 04/04/2021    Procedure: CLOSED REDUCTION WITH PERCUTANEOUS PINNING RIGHT THUMB;  Surgeon: Romona Harari, MD;  Location: [facility] OR;  Service: Orthopedics;  Laterality: Right;   COLONOSCOPY  2003   negative ; Dr Jakie   ERCP N/A 12/07/2018   Procedure: ENDOSCOPIC RETROGRADE CHOLANGIOPANCREATOGRAPHY (ERCP);  Surgeon: Wilhelmenia Aloha Raddle., MD;  Location: [facility];  Service: Gastroenterology;  Laterality: N/A;   ESOPHAGOGASTRODUODENOSCOPY (EGD) WITH PROPOFOL  N/A 12/07/2018   Procedure: ESOPHAGOGASTRODUODENOSCOPY (EGD) WITH PROPOFOL ;  Surgeon: Wilhelmenia Aloha Raddle., MD;  Location: [facility];  Service: Gastroenterology;  Laterality: N/A;   EUS N/A 12/07/2018   Procedure: UPPER ENDOSCOPIC ULTRASOUND (EUS) RADIAL;  Surgeon: Wilhelmenia Aloha Raddle., MD;  Location: [facility];  Service: Gastroenterology;  Laterality: N/A;   INGUINAL HERNIA REPAIR  08/07/2011   Procedure: HERNIA REPAIR INGUINAL ADULT;  Surgeon: Cough Ebbie, MD;  Location: [facility];  Service: General;  Laterality: Right;   IR ANGIO INTRA EXTRACRAN SEL INTERNAL CAROTID BILAT MOD SED  05/14/2020   IR ANGIO VERTEBRAL SEL VERTEBRAL BILAT MOD SED  05/14/2020   IR GUIDED DRAIN W CATHETER PLACEMENT  02/05/2021   IR RADIOLOGIST EVAL & MGMT  02/13/2021   LOOP RECORDER INSERTION  06/2020   last remote check was on 03/12/21   MELANOMA EXCISION  07/28/2011   Dr Jadine; stomach and chest   REMOVAL OF  STONES  12/07/2018   Procedure: REMOVAL OF STONES;  Surgeon: Wilhelmenia Aloha Raddle., MD;  Location: [facility];  Service: Gastroenterology;;   TONSILLECTOMY  1951   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   UPPER GASTROINTESTINAL ENDOSCOPY  1983   hiatal hernia   VASECTOMY  1979    Current Outpatient Medications  Medication Instructions   allopurinol  (ZYLOPRIM ) 300 mg, Oral, Daily   atorvastatin  (LIPITOR) 20 mg, Oral, Daily at bedtime   clopidogrel  (PLAVIX ) 75 mg, Oral, Daily   esomeprazole  (NEXIUM ) 40 mg,  Oral, Daily before breakfast   fluticasone  (FLONASE ) 50 MCG/ACT nasal spray 2 sprays, Daily PRN   L-Methylfolate-B12-B6-B2 (CEREFOLIN) 09-25-48-5 MG TABS 1 tablet, Oral, Every morning   lamoTRIgine  (LAMICTAL ) 25 mg, Daily   levothyroxine  (SYNTHROID ) 75 MCG tablet TAKE 1 TABLET(75 MCG) BY MOUTH DAILY BEFORE BREAKFAST   metoprolol  succinate (TOPROL -XL) 150 mg, Oral, Daily, Take with or immediately following a meal   naltrexone (DEPADE) 50 mg, Daily   QUEtiapine  (SEROQUEL ) 800 mg, Daily at bedtime   timolol (TIMOPTIC) 0.5 % ophthalmic solution 1 drop, 2 times daily   vitamin B-1 250 mg, Oral, Daily   Vitamin D  2,000 Units, Daily       Objective:   Physical Exam BP 138/82   Pulse (!) 56   Temp 97.7 F (36.5 C) (Oral)   Resp 16   Ht 6' 1 (1.854 m)   Wt 218 lb 4 oz (99 kg)   SpO2 96%   BMI 28.79 kg/m  General:   Well developed, NAD, BMI noted. HEENT:  Normocephalic . Face symmetric, atraumatic Lungs:  CTA B Normal respiratory effort, no intercostal retractions, no accessory muscle use. Heart: RRR,  no murmur.  Lower extremities: no pretibial edema bilaterally  Skin: Not pale. Not jaundice Neurologic:  alert & oriented X3.  Speech normal, gait appropriate for age and unassisted Psych--  Cognition and judgment appear intact.  Cooperative with normal attention span and concentration.  Behavior appropriate. No anxious or depressed appearing.      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  07-2021, ASA changed to Plavix  Gout: Dr Lavell, released to PCP 2018 Melanoma 2014 Dr. [place]  Osteopenia  T score -2.4  ( [phone];  T score is -2.0  (08/2018).  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  No change GERD, Barrett's: EGD 12/01/2023, on Nexium . Stroke sequela: See LOV, had a fall, s/p PT, thinks it helped. No more falls .  EtOH abuse in remission.  Remains sober. Gout: No recent events.  Last uric acid low, at some point he tried to stop allopurinol  and symptoms resurface.  No change. High cholesterol, on atorvastatin . Bipolar: per psych, controlled Preventive care: Flu shot today.  Recommend a COVID booster. RTC 4 months

## 2024-01-12 NOTE — Assessment & Plan Note (Signed)
 Hypothyroidism: TSH was elevated, dose adjusted, last TSH normal 3 months ago.  No change GERD, Barrett's: EGD 12/01/2023, on Nexium . Stroke sequela: See LOV, had a fall, s/p PT, thinks it helped. No more falls .  EtOH abuse in remission.  Remains sober. Gout: No recent events.  Last uric acid low, at some point he tried to stop allopurinol  and symptoms resurface.  No change. High cholesterol, on atorvastatin . Bipolar: per psych, controlled Preventive care: Flu shot today.  Recommend a COVID booster. RTC 4 months

## 2024-01-12 NOTE — Patient Instructions (Signed)
 You got your flu shot today  Recommend COVID-vaccine booster at your pharmacy this fall  No blood work today  Please stop by the front and arrange a follow-up in 4 months

## 2024-01-13 ENCOUNTER — Other Ambulatory Visit: Payer: Self-pay | Admitting: Internal Medicine

## 2024-01-13 NOTE — Progress Notes (Signed)
 Remote Loop Recorder Transmission

## 2024-01-22 NOTE — Progress Notes (Signed)
 Remote Loop Recorder Transmission

## 2024-01-31 DIAGNOSIS — H401131 Primary open-angle glaucoma, bilateral, mild stage: Secondary | ICD-10-CM | POA: Diagnosis not present

## 2024-02-02 ENCOUNTER — Other Ambulatory Visit: Payer: Self-pay

## 2024-02-02 NOTE — Telephone Encounter (Signed)
 Please advise if refill is appropriate. Last sent in on 08/02/23

## 2024-02-03 ENCOUNTER — Ambulatory Visit (INDEPENDENT_AMBULATORY_CARE_PROVIDER_SITE_OTHER)

## 2024-02-03 ENCOUNTER — Encounter

## 2024-02-03 DIAGNOSIS — I639 Cerebral infarction, unspecified: Secondary | ICD-10-CM | POA: Diagnosis not present

## 2024-02-04 LAB — CUP PACEART REMOTE DEVICE CHECK
Date Time Interrogation Session: 20251008230736
Implantable Pulse Generator Implant Date: 20220502

## 2024-02-06 MED ORDER — CEREFOLIN 6-1-50-5 MG PO TABS: 1.0000 | ORAL_TABLET | Freq: Every morning | ORAL | 1 refills | Status: DC

## 2024-02-07 NOTE — Progress Notes (Signed)
 Remote Loop Recorder Transmission

## 2024-02-14 ENCOUNTER — Telehealth: Payer: Self-pay | Admitting: Neurology

## 2024-02-14 MED ORDER — CEREFOLIN 6-1-50-5 MG PO TABS: 1.0000 | ORAL_TABLET | Freq: Every morning | ORAL | 0 refills | Status: AC

## 2024-02-14 NOTE — Telephone Encounter (Signed)
 Patient came into the lobby advising that [facility] [phone] needs a new prescription sent over in order to fill the medication. The pharmacy stated they have been calling our office. They are not sure what happened to the original prescription, The MK#[id number] is the prescription number Cerefolin Levomefolate.  Thank you

## 2024-02-14 NOTE — Addendum Note (Signed)
 Addended by: JOSHUA MAURILIO CROME on: 02/14/2024 01:13 PM   Modules accepted: Orders

## 2024-02-14 NOTE — Telephone Encounter (Signed)
 Called and informed pt. Pt was very appreciative.

## 2024-02-14 NOTE — Telephone Encounter (Addendum)
 Phone room: please call back and let them know rx refill sent to [facility] as requested.   Reviewed pt chart. Last seen 03/22/23. Dr. Rosemarie sent refill on 02/06/24 #90, 1 refill to [facility]. I called [facility] at  [phone] and spoke with Toniya. Cx rx sent.   Resent to [facility] per pt request for qty 90 since they will have yearly follow up 03/20/24.

## 2024-02-16 ENCOUNTER — Ambulatory Visit: Payer: Self-pay | Admitting: Cardiovascular Disease

## 2024-03-05 ENCOUNTER — Ambulatory Visit (INDEPENDENT_AMBULATORY_CARE_PROVIDER_SITE_OTHER)

## 2024-03-05 DIAGNOSIS — I639 Cerebral infarction, unspecified: Secondary | ICD-10-CM | POA: Diagnosis not present

## 2024-03-05 LAB — CUP PACEART REMOTE DEVICE CHECK
Date Time Interrogation Session: 20251108230503
Implantable Pulse Generator Implant Date: 20220502

## 2024-03-06 ENCOUNTER — Ambulatory Visit: Payer: Self-pay | Admitting: Cardiovascular Disease

## 2024-03-06 ENCOUNTER — Encounter

## 2024-03-07 DIAGNOSIS — H401131 Primary open-angle glaucoma, bilateral, mild stage: Secondary | ICD-10-CM | POA: Diagnosis not present

## 2024-03-09 NOTE — Progress Notes (Signed)
 Remote Loop Recorder Transmission

## 2024-03-20 ENCOUNTER — Encounter: Payer: Self-pay | Admitting: Neurology

## 2024-03-20 ENCOUNTER — Ambulatory Visit: Payer: Medicare Other | Admitting: Neurology

## 2024-03-20 VITALS — BP 168/90 | HR 66 | Ht 73.0 in | Wt 221.2 lb

## 2024-03-20 DIAGNOSIS — G3184 Mild cognitive impairment, so stated: Secondary | ICD-10-CM

## 2024-03-20 DIAGNOSIS — Z8673 Personal history of transient ischemic attack (TIA), and cerebral infarction without residual deficits: Secondary | ICD-10-CM | POA: Diagnosis not present

## 2024-03-20 DIAGNOSIS — R29898 Other symptoms and signs involving the musculoskeletal system: Secondary | ICD-10-CM | POA: Diagnosis not present

## 2024-03-20 NOTE — Progress Notes (Signed)
 [facility] [address] [place]. [place] [address] [phone]       OFFICE FOLLOW-UP NOTE  Jeffrey Acosta Date of Birth:  [birth year] Medical Record Number:  [id number]   HPI: Mr. Burnham is a 73 year old Caucasian male seen today for initial office follow-up visit following hospital admission for subarachnoid hemorrhage in January 2022.  History is obtained from the patient and his wife is accompanying him today as well as review of electronic medical records and personally reviewed pertinent imaging films in PACS.  He has past medical history of bipolar disorder, hypothyroidism, diverticulosis, gastroesophageal reflux disease, hyperlipidemia, history of pia, atrial fibrillation and alcohol and tobacco abuse.  He was admitted on 04/30/2020 to [facility] when he was found to collapsed suddenly at home.  CT scan of the head showed moderate to large amount of acute subarachnoid hemorrhage felt to be consistent with a ruptured aneurysm with small amount of bilateral intraventricular hemorrhage.  CT angiogram was negative for aneurysm or AV malformation.  Patient was managed conservatively and was doing well until 05/11/2020 when he wandered off from the inpatient room for approximately 2 hours.  Patient later reported he had gone to smoke a cigarette but he was found unresponsive and hypothermic outside on the hospital campus.  CT scan of the head showed further regression of the bilateral extra-axial hemorrhage with resolved intraventricular blood.  The patient's family subsequently had reported that the previous day they noticed change in mental status with aphasia and inattention to conversation and decreased responsiveness.  MRI scan of the brain was obtained which showed multiple small acute infarcts in the left basal ganglia, left periatrial temporal lobe, bilateral frontoparietal regions with no associated edema or mass-effect.  EEG showed moderate diffuse  encephalopathy nonspecific in etiology without seizures.  2D echo showed ejection fraction of 55 to 60%.  SARS coronavirus test was negative.  LDL was low at 23 mg percent and hemoglobin A1c was 3.9.  Urine drug screen was negative.  Patient was found to have temperature and elevated white count and started on Zosyn  and vancomycin .  He was started on nimodipine  for vasospasm prophylaxis.  He underwent diagnostic cerebral catheter angiogram by Dr. Lanis which was negative for any aneurysm or AV malformation.  Patient informs me that he has atrial fibrillation but was only on aspirin  prior to admission.  However I do not find documentation of this in the hospital records.  He had a Zio patch as an outpatient and he has a appointment to discuss results with cardiologist Dr. CLEMENTEEN Mulch later this week.  Review of his previous physician office visits mention atrial tachycardia diagnosed in December 2020 but no definite mention of A. fib was made.  Patient states is done well since discharge to home._Outpatient physical occupational speech therapy.  His speech is mostly back to normal though still has some occasional word hesitancy and word finding difficulties.  He wants to start driving.  Is fully independent in all activities of daily living.  He has no complaints. Update 10/23/2020: He returns for follow-up after last visit 3 months ago.  Patient denies any headaches or stroke or TIA symptoms.  His main complaint today is short-term memory which seems to be getting worse.  He has trouble remembering recent information but can remember things from the past well.  He still independent and manages her own affairs.  Complains of occasional feeling of lightheadedness and dizziness but that does not last long.  He also  complains of leg aches and pains occasionally feels his balance may also be affected but this does not stay long.  He does have family history of and her grandmother had dementia and worries about it.  He  did undergo a loop recorder insertion and so for paroxysmal A. fib has not yet been found.  He is tolerating aspirin  well without bruising or bleeding.  Blood pressure usually well controlled today it is slightly elevated at 150/81. Update 03/27/2021 : He returns for follow-up after last visit 6 months ago.  Patient states his short-term memory and cognitive difficulties remain about unchanged.  He had lab work done for reversible causes of memory loss on 10/23/2020 which were all normal.  EEG on 11/18/2020 showed mild to 67 Hostettler range slowing but no epileptiform activity.  MRI scan of the brain on 11/02/2020 showed no acute abnormalities and stable appearance of bifrontal hygromas and mild changes of generalized atrophy and changes of small vessel disease.  Patient had a fall 2 weeks ago when he tripped on the steps of a new house.  He developed an abrasion on his leg and dislocated his thumb.  He had a CT scan of the head done on 03/14/2021 which showed stable appearance of bifrontal small hygromas and no acute abnormality.  He has had a loop recorder inserted by cardiology and so far atrial tachycardia has been found but no A. fib.  He has no new complaints today. Update 09/23/2021 : He returns for follow-up after last visit 6 months ago.  He is accompanied by his sister.  Patient continues to have short-term memory difficulties.  He forgets phrases and words at times with later on when is not thinking about how they come back.  He feels his cognitive difficulties are about the same and not getting worse.  He scored 28/30 on the Mini-Mental today which is unchanged from last visit.  He has been participating mentally challenging activities like solving crossword puzzles.  He is currently not on any memory enhancement medications.  He remains on aspirin  and Plavix  both for stroke prevention for unclear reasons is tolerating it well with only minor bruising but no bleeding episodes.  Has had no recurrent stroke  or TIA symptoms.  His blood pressure is usually under good control though today it is slightly elevated in office at 164/88.  He has no new complaints. Update 03/26/2022 : He returns for follow-up after last visit 6 months ago.  He states he is doing well.  He has been taking Cerefolin NAC which seems to have helped him.  He is also participating in regular activities like solving crossword puzzles and word searches.  Remains on Plavix  which is tolerating well without bruising or bleeding.  States his blood pressure is quite well controlled at home though it is elevated in my office today at 173/78.  He has no new complaints today.  He scored 29/30 on Mini-Mental status testing today which was slightly improved from last visit. Update 03/22/2023 : He returns for follow-up after last visit a year ago.  He continues to do well from neurovascular standpoint.  Has had no recurrent stroke or TIA symptoms.  Loop recorder interrogation on 02/28/2023 showed no evidence of paroxysmal A-fib.  Remains on Plavix  for stroke prevention and is tolerating it well without bruising or bleeding.  States his blood pressure is under good control.He is tolerating Lipitor well without side effects but has not had any recent lipid profile checked.  Last lipid  profile on 08/28/2022 had shown LDL cholesterol to be 41 mg percent.  Continues to have mild short-term memory and cognitive difficulties but he feels these are stable and unchanged.  He forgets names easily and gets confused at times.  He has difficult the understanding what people are saying at times.  He has no new complaints.  He is independent in activities of daily living.  He remains on Cerefolin.  He does occasional crossword puzzles but is not doing many mentally challenging activities.  He states his bipolar disorder is well-controlled and he does follow-up with his psychiatrist regularly. Update 03/20/2024: He returns for follow-up after last visit a year ago.  He states he  is doing well.  Has had no recurrent TIA or stroke symptoms now for more than 3 years.  He is quite active and walks 2 miles every other day.  He is also playing a lot with his grandkids and driving them around for the activities.  Remains on Plavix  which is tolerating well without significant bruising or bleeding.  He has had 3 falls in the last few years but he blames this on trying to walk too fast and landing steps.  His loop recorder interrogation on 03/04/2024 showed no evidence of A-fib.  Last lab work on 03/22/2023 showed hemoglobin A1c to be optimal at 5.1 and LDL cholesterol at 65 mg percent.  Continues to have mild short-term memory difficulties but these are nonprogressive.  He does not do cognitively challenging activities like solving crossword puzzles, playing bridge and sudoku.  He is quite independent in all activities of daily living.  On Mini-Mental status exam testing today scored 30/30.  Remains on Cerefolin which she takes every morning. ROS:   14 system review of systems is positive for memory loss, lightheadedness, dizziness, leg ache and pain, leg weakness altered consciousness, aphasia, weakness, confusion, word finding difficulties all other systems negative  PMH:  Past Medical History:  Diagnosis Date   Alcoholism in remission (HCC)    in AA   Arrhythmia    atrial tachycardia   Bipolar affective disorder (HCC)    Depression    Diverticulosis of colon (without mention of hemorrhage)    GERD (gastroesophageal reflux disease)    Gout of multiple sites    Hiatal hernia    Hx of migraines    migraines- last one since 9-10 yrs ago   Hydrocele    HYPERLIPIDEMIA 01/10/2009   Qualifier: Diagnosis of  By: Joshua CMA, Chemira    NMR Lipoprofile 2011 LDL could not be calculated  Due to TG of 889  (811  / 750 ),  HDL 50 . Father MI @ 33 PGM CVA early 72s; PGF CVA @72     Hypothyroidism    Melanoma of skin, site unspecified 11/25/2012   07/2011 abdominal  Stage 1 Dr Amy Jordan Dr  Jadine and chest   Osteopenia    Polyarthralgia    Status post placement of implantable loop recorder 08/26/2020   Stroke [facility])    x 3 - last one 04/2020   Subarachnoid hemorrhage [facility])     Social History:  Social History   Socioeconomic History   Marital status: Married    Spouse name: [name]   Number of children: 3   Years of education: Not on file   Highest education level: Bachelor's degree (e.g., BA, AB, BS)  Occupational History   Occupation: retired  Tobacco Use   Smoking status: Never   Smokeless tobacco: Never  Vaping  Use   Vaping status: Never Used  Substance and Sexual Activity   Alcohol use: Not Currently    Comment: sober   Drug use: No   Sexual activity: Yes    Partners: Female    Comment: Having some issues with ED.  Would like provider's recommendation.    Other Topics Concern   Not on file  Social History Narrative   Lives with Wife, [name]   Right Handed   Drinks 4-5 cups caffeine/week   Social Drivers of Health   Financial Resource Strain: Low Risk  (02/26/2023)   Overall Financial Resource Strain (CARDIA)    Difficulty of Paying Living Expenses: Not very hard  Food Insecurity: No Food Insecurity (02/26/2023)   Hunger Vital Sign    Worried About Running Out of Food in the Last Year: Never true    Ran Out of Food in the Last Year: Never true  Transportation Needs: No Transportation Needs (02/26/2023)   PRAPARE - Administrator, Civil Service (Medical): No    Lack of Transportation (Non-Medical): No  Physical Activity: Insufficiently Active (02/26/2023)   Exercise Vital Sign    Days of Exercise per Week: 2 days    Minutes of Exercise per Session: 20 min  Stress: Stress Concern Present (02/26/2023)   [employer] of Occupational Health - Occupational Stress Questionnaire    Feeling of Stress : To some extent  Social Connections: Socially Integrated (02/26/2023)   Social Connection and Isolation Panel    Frequency of Communication with  Friends and Family: More than three times a week    Frequency of Social Gatherings with Friends and Family: Once a week    Attends Religious Services: 1 to 4 times per year    Active Member of [employer] or Organizations: Yes    Attends Engineer, Structural: More than 4 times per year    Marital Status: Married  Catering Manager Violence: Not At Risk (11/26/2021)   Humiliation, Afraid, Rape, and Kick questionnaire    Fear of Current or Ex-Partner: No    Emotionally Abused: No    Physically Abused: No    Sexually Abused: No    Medications:   Current Outpatient Medications on File Prior to Visit  Medication Sig Dispense Refill   allopurinol  (ZYLOPRIM ) 300 MG tablet Take 1 tablet (300 mg total) by mouth daily. 90 tablet 1   atorvastatin  (LIPITOR) 20 MG tablet Take 1 tablet (20 mg total) by mouth at bedtime. 90 tablet 1   Cholecalciferol (VITAMIN D ) 50 MCG (2000 UT) tablet Take 2,000 Units by mouth daily.     clopidogrel  (PLAVIX ) 75 MG tablet Take 1 tablet (75 mg total) by mouth daily. 90 tablet 1   esomeprazole  (NEXIUM ) 40 MG capsule Take 1 capsule (40 mg total) by mouth daily before breakfast. 90 capsule 1   L-Methylfolate-B12-B6-B2 (CEREFOLIN) 09-25-48-5 MG TABS Take 1 tablet by mouth every morning. 90 tablet 0   lamoTRIgine  (LAMICTAL ) 25 MG tablet Take 25 mg by mouth daily.     latanoprost (XALATAN) 0.005 % ophthalmic solution 1 drop at bedtime.     levothyroxine  (SYNTHROID ) 75 MCG tablet TAKE 1 TABLET(75 MCG) BY MOUTH DAILY BEFORE BREAKFAST 90 tablet 1   metoprolol  succinate (TOPROL -XL) 100 MG 24 hr tablet Take 1.5 tablets (150 mg total) by mouth daily. Take with or immediately following a meal 135 tablet 1   Na Sulfate-K Sulfate-Mg Sulfate concentrate (SUPREP) 17.5-3.13-1.6 GM/177ML SOLN Take by mouth once.  naltrexone (DEPADE) 50 MG tablet Take 50 mg by mouth daily.   12   QUEtiapine  (SEROQUEL ) 400 MG tablet Take 800 mg by mouth at bedtime.     Thiamine  HCl (VITAMIN B-1) 250 MG  tablet Take 1 tablet (250 mg total) by mouth daily. 90 tablet 3   fluticasone  (FLONASE ) 50 MCG/ACT nasal spray Place 2 sprays into both nostrils daily as needed for congestion. (Patient not taking: Reported on 03/20/2024)     timolol (TIMOPTIC) 0.5 % ophthalmic solution Place 1 drop into both eyes 2 (two) times daily. (Patient not taking: Reported on 03/20/2024)     No current facility-administered medications on file prior to visit.    Allergies:   Allergies  Allergen Reactions   Iohexol  Hives    Desc: developed hives after injection; resolved with 50 mg benadryl  given to pt.  Desc: developed hives after injection; resolved with 50 mg benadryl  given to pt.,  Desc: developed hives after injection; resolved with 50 mg benadryl  given to pt.     Physical Exam General: well developed, well nourished elderly Caucasian male, seated, in no evident distress Head: head normocephalic and atraumatic.  Neck: supple with no carotid or supraclavicular bruits Cardiovascular: regular rate and rhythm, no murmurs Musculoskeletal: no deformity Skin:  no rash/petichiae Vascular:  Normal pulses all extremities Vitals:   03/20/24 1303  BP: (!) 168/90  Pulse: 66   Neurologic Exam Mental Status: Awake and fully alert. Oriented to place and time. Recent and remote memory slightly diminished.  Attention span, concentration and fund of knowledge appropriate. Mood and affect appropriate.   Able to name 14 animals which can walk on 4 legs.  Clock drawing 4/4.  Mini-Mental status exam score 30/30  .   Cranial Nerves: Fundoscopic exam not done. Pupils equal, briskly reactive to light. Extraocular movements full without nystagmus. Visual fields full to confrontation. Hearing intact. Facial sensation intact. Face, tongue, palate moves normally and symmetrically.  Motor: Normal bulk and tone. Normal strength in all tested extremity muscles. Sensory.: intact to touch ,pinprick .position and vibratory sensation.   Coordination: Rapid alternating movements normal in all extremities. Finger-to-nose and heel-to-shin performed accurately bilaterally. Gait and Station: Arises from chair without difficulty. Stance is normal. Gait demonstrates normal stride length and balance . Able to heel, toe and tandem walk with moderate difficulty.  Reflexes: 1+ and symmetric. Toes downgoing.       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  Doubt significant vasospasm contributing as it was not clearly documented on transcranial Doppler study and many of the infarcts were subcortical.  He also has  mild short-term memory and cognitive difficulties likely from mild cognitive impairment which appear stable    PLAN: I had a long d/w patient about  his remote brain hemorrhage and strokes, risk for recurrent stroke/TIAs, personally independently reviewed imaging studies and stroke evaluation results and answered questions.Continue Plavix  75 mg daily for secondary stroke prevention and maintain strict control of hypertension with blood pressure goal below 130/90, diabetes with hemoglobin A1c goal below 6.5% and lipids with LDL cholesterol goal below 70 mg/dL. I also advised the patient to eat a healthy diet with plenty of whole grains, cereals, fruits and vegetables, exercise regularly and maintain ideal body weight .I encouraged him to be  careful while climbing steps and not to walk too fast to avoid falls.  Advised him to continue participating in cognitively challenging activities like solving crossword puzzles, playing bridge and sudoku.  We also discussed memory compensation strategies.  Followup in the future with me only as necessary and no scheduled appointment was made.   I personally spent a total of  35 minutes in the care of the patient today including getting/reviewing separately obtained history, performing a medically appropriate exam/evaluation, counseling and educating, placing orders, referring and communicating with other health care professionals, documenting clinical information in the EHR, independently interpreting results, and coordinating care.        Eather Popp, MD Note: This document was prepared with digital dictation and possible smart phrase technology. Any transcriptional errors that result from this process are unintentional

## 2024-03-20 NOTE — Patient Instructions (Signed)
 I had a long d/w patient about his remote brain hemorrhage and strokes, risk for recurrent stroke/TIAs, personally independently reviewed imaging studies and stroke evaluation results and answered questions.Continue Plavix  75 mg daily for secondary stroke prevention and maintain strict control of hypertension with blood pressure goal below 130/90, diabetes with hemoglobin A1c goal below 6.5% and lipids with LDL cholesterol goal below 70 mg/dL. I also advised the patient to eat a healthy diet with plenty of whole grains, cereals, fruits and vegetables, exercise regularly and maintain ideal body weight .I encouraged him to be careful while climbing steps and not to walk too fast to avoid falls.  Advised him to continue participating in cognitively challenging activities like solving crossword puzzles, playing bridge and sudoku.  We also discussed memory compensation strategies.  Followup in the future with me only as necessary and no scheduled appointment was made.

## 2024-04-05 ENCOUNTER — Encounter

## 2024-04-07 ENCOUNTER — Ambulatory Visit: Attending: Cardiovascular Disease

## 2024-04-07 DIAGNOSIS — I639 Cerebral infarction, unspecified: Secondary | ICD-10-CM

## 2024-04-08 LAB — CUP PACEART REMOTE DEVICE CHECK
Date Time Interrogation Session: 20251211230517
Implantable Pulse Generator Implant Date: 20220502

## 2024-04-10 DIAGNOSIS — K080 Exfoliation of teeth due to systemic causes: Secondary | ICD-10-CM | POA: Diagnosis not present

## 2024-04-11 ENCOUNTER — Ambulatory Visit: Payer: Self-pay | Admitting: Cardiovascular Disease

## 2024-04-14 NOTE — Progress Notes (Signed)
 Remote Loop Recorder Transmission

## 2024-04-19 ENCOUNTER — Other Ambulatory Visit: Payer: Self-pay | Admitting: Internal Medicine

## 2024-04-20 ENCOUNTER — Other Ambulatory Visit: Payer: Self-pay | Admitting: Internal Medicine

## 2024-05-06 ENCOUNTER — Encounter

## 2024-05-08 ENCOUNTER — Ambulatory Visit: Attending: Cardiovascular Disease

## 2024-05-08 DIAGNOSIS — I639 Cerebral infarction, unspecified: Secondary | ICD-10-CM

## 2024-05-08 LAB — CUP PACEART REMOTE DEVICE CHECK
Date Time Interrogation Session: 20260111230821
Implantable Pulse Generator Implant Date: 20220502

## 2024-05-09 ENCOUNTER — Ambulatory Visit: Payer: Self-pay | Admitting: Cardiovascular Disease

## 2024-05-12 NOTE — Progress Notes (Signed)
 Remote Loop Recorder Transmission

## 2024-05-15 ENCOUNTER — Ambulatory Visit: Admitting: Internal Medicine

## 2024-05-15 ENCOUNTER — Encounter: Payer: Self-pay | Admitting: Internal Medicine

## 2024-05-15 VITALS — BP 138/88 | HR 70 | Temp 97.3°F | Resp 18 | Ht 73.0 in | Wt 220.1 lb

## 2024-05-15 DIAGNOSIS — E785 Hyperlipidemia, unspecified: Secondary | ICD-10-CM | POA: Diagnosis not present

## 2024-05-15 DIAGNOSIS — M8580 Other specified disorders of bone density and structure, unspecified site: Secondary | ICD-10-CM

## 2024-05-15 DIAGNOSIS — E039 Hypothyroidism, unspecified: Secondary | ICD-10-CM

## 2024-05-15 DIAGNOSIS — I10 Essential (primary) hypertension: Secondary | ICD-10-CM | POA: Diagnosis not present

## 2024-05-15 DIAGNOSIS — M1000 Idiopathic gout, unspecified site: Secondary | ICD-10-CM

## 2024-05-15 DIAGNOSIS — F1021 Alcohol dependence, in remission: Secondary | ICD-10-CM | POA: Diagnosis not present

## 2024-05-15 LAB — LIPID PANEL
Cholesterol: 126 mg/dL (ref 28–200)
HDL: 49.4 mg/dL
LDL Cholesterol: 56 mg/dL (ref 10–99)
NonHDL: 76.36
Total CHOL/HDL Ratio: 3
Triglycerides: 104 mg/dL (ref 10.0–149.0)
VLDL: 20.8 mg/dL (ref 0.0–40.0)

## 2024-05-15 LAB — BASIC METABOLIC PANEL WITH GFR
BUN: 15 mg/dL (ref 6–23)
CO2: 26 meq/L (ref 19–32)
Calcium: 9.3 mg/dL (ref 8.4–10.5)
Chloride: 107 meq/L (ref 96–112)
Creatinine, Ser: 1.56 mg/dL — ABNORMAL HIGH (ref 0.40–1.50)
GFR: 43.14 mL/min — ABNORMAL LOW
Glucose, Bld: 102 mg/dL — ABNORMAL HIGH (ref 70–99)
Potassium: 4.5 meq/L (ref 3.5–5.1)
Sodium: 142 meq/L (ref 135–145)

## 2024-05-15 LAB — CBC WITH DIFFERENTIAL/PLATELET
Basophils Absolute: 0 K/uL (ref 0.0–0.1)
Basophils Relative: 0.7 % (ref 0.0–3.0)
Eosinophils Absolute: 0.3 K/uL (ref 0.0–0.7)
Eosinophils Relative: 4.6 % (ref 0.0–5.0)
HCT: 38.4 % — ABNORMAL LOW (ref 39.0–52.0)
Hemoglobin: 13.5 g/dL (ref 13.0–17.0)
Lymphocytes Relative: 21.9 % (ref 12.0–46.0)
Lymphs Abs: 1.5 K/uL (ref 0.7–4.0)
MCHC: 35.2 g/dL (ref 30.0–36.0)
MCV: 97.4 fl (ref 78.0–100.0)
Monocytes Absolute: 0.5 K/uL (ref 0.1–1.0)
Monocytes Relative: 8.1 % (ref 3.0–12.0)
Neutro Abs: 4.4 K/uL (ref 1.4–7.7)
Neutrophils Relative %: 64.7 % (ref 43.0–77.0)
Platelets: 172 K/uL (ref 150.0–400.0)
RBC: 3.94 Mil/uL — ABNORMAL LOW (ref 4.22–5.81)
RDW: 15 % (ref 11.5–15.5)
WBC: 6.8 K/uL (ref 4.0–10.5)

## 2024-05-15 LAB — URIC ACID: Uric Acid, Serum: 5.4 mg/dL (ref 4.0–7.8)

## 2024-05-15 LAB — TSH: TSH: 0.97 u[IU]/mL (ref 0.35–5.50)

## 2024-05-15 MED ORDER — AMLODIPINE BESYLATE 5 MG PO TABS: 5.0000 mg | ORAL_TABLET | Freq: Every day | ORAL | 6 refills | Status: AC

## 2024-05-15 NOTE — Assessment & Plan Note (Signed)
 HTN Ambulatory BPs not at goal, systolic BP frequently in the 849d 140s.  Continue metoprolol  XL 150 mg daily, add amlodipine , . Advised lifestyle modifications: reduce salt, maintain physical activity, avoid NSAIDs. Check BMP Hyperlipidemia Managed with atorvastatin  20 mg daily.  Check FLP  hypothyroidism Managed with levothyroxine  75 mcg daily.  Check TSH Gout No recent flares, managed with allopurinol  300 mg daily. Last LFTs normal, check a uric acid. Osteopenia: Previous T-score of -2.2, managed with vitamin D  supplementation.  Check a DEXA Alcohol dependence, in remission Sober for two years! H/os stroke, TIAs: Neurology OV 03/20/2024, rec to continue Plavix  and good control of HTN and other cardiovascular risks.  Continue cognitive challenge.  Was rec to RTC PRN per note review RTC 4 months

## 2024-05-15 NOTE — Progress Notes (Signed)
 "  Subjective:    Patient ID: Jeffrey Acosta, male    DOB: 1951/03/06, 74 y.o.   MRN: [id number]  DOS:  05/15/2024 Follow-up  Discussed the use of AI scribe software for clinical note transcription with the patient, who gave verbal consent to proceed.  History of Present Illness Jeffrey Acosta is a 74 year old male who presents for a follow-up visit.  Hypertension - Usual home blood pressure readings in the 150-160 mmHg range - Monitors blood pressure weekly - No chest pain, shortness of breath, neurologic symptoms, palpitations, edema, or gastrointestinal symptoms  History of skin cancer - Followed by dermatology  Alcohol use disorder - History of alcohol abuse - Sober for two years  Immunization status - Uncertain COVID vaccination status   BP Readings from Last 3 Encounters:  05/15/24 138/88  03/20/24 (!) 168/90  01/12/24 138/82     Review of Systems See above   Past Medical History:  Diagnosis Date   Alcoholism in remission (HCC)    in AA   Arrhythmia    atrial tachycardia   Bipolar affective disorder (HCC)    Depression    Diverticulosis of colon (without mention of hemorrhage)    GERD (gastroesophageal reflux disease)    Gout of multiple sites    Hiatal hernia    Hx of migraines    migraines- last one since 9-10 yrs ago   Hydrocele    HYPERLIPIDEMIA 01/10/2009   Qualifier: Diagnosis of  By: Joshua CMA, Chemira    NMR Lipoprofile 2011 LDL could not be calculated  Due to TG of 889  (811  / 750 ),  HDL 50 . Father MI @ 40 PGM CVA early 56s; PGF CVA @72     Hypothyroidism    Melanoma of skin, site unspecified 11/25/2012   07/2011 abdominal  Stage 1 Dr Amy Jordan Dr Jadine and chest   Osteopenia    Polyarthralgia    Status post placement of implantable loop recorder 08/26/2020   Stroke (HCC)    x 3 - last one 04/2020   Subarachnoid hemorrhage [facility])     Past Surgical History:  Procedure Laterality Date   BILIARY BRUSHING  12/07/2018   Procedure:  BILIARY BRUSHING;  Surgeon: Wilhelmenia Aloha Raddle., MD;  Location: [facility];  Service: Gastroenterology;;   BILIARY DILATION  12/07/2018   Procedure: BILIARY DILATION;  Surgeon: Wilhelmenia Aloha Raddle., MD;  Location: [facility];  Service: Gastroenterology;;   BIOPSY  12/07/2018   Procedure: BIOPSY;  Surgeon: Wilhelmenia Aloha Raddle., MD;  Location: [facility];  Service: Gastroenterology;;   CHOLECYSTECTOMY N/A 01/29/2021   Procedure: LAPAROSCOPIC CHOLECYSTECTOMY WITH ICG DYE;  Surgeon: Ebbie Cough, MD;  Location: [facility] OR;  Service: General;  Laterality: N/A;   CLOSED REDUCTION FINGER WITH PERCUTANEOUS PINNING Right 04/04/2021   Procedure: CLOSED REDUCTION WITH PERCUTANEOUS PINNING RIGHT THUMB;  Surgeon: Romona Harari, MD;  Location: [facility] OR;  Service: Orthopedics;  Laterality: Right;   COLONOSCOPY  2003   negative ; Dr Jakie   ERCP N/A 12/07/2018   Procedure: ENDOSCOPIC RETROGRADE CHOLANGIOPANCREATOGRAPHY (ERCP);  Surgeon: Wilhelmenia Aloha Raddle., MD;  Location: [facility];  Service: Gastroenterology;  Laterality: N/A;   ESOPHAGOGASTRODUODENOSCOPY (EGD) WITH PROPOFOL  N/A 12/07/2018   Procedure: ESOPHAGOGASTRODUODENOSCOPY (EGD) WITH PROPOFOL ;  Surgeon: Wilhelmenia Aloha Raddle., MD;  Location: [facility];  Service: Gastroenterology;  Laterality: N/A;   EUS N/A 12/07/2018   Procedure: UPPER ENDOSCOPIC ULTRASOUND (EUS) RADIAL;  Surgeon: Wilhelmenia Aloha Raddle., MD;  Location: [facility];  Service:  Gastroenterology;  Laterality: N/A;   INGUINAL HERNIA REPAIR  08/07/2011   Procedure: HERNIA REPAIR INGUINAL ADULT;  Surgeon: Donnice Bury, MD;  Location: [facility];  Service: General;  Laterality: Right;   IR ANGIO INTRA EXTRACRAN SEL INTERNAL CAROTID BILAT MOD SED  05/14/2020   IR ANGIO VERTEBRAL SEL VERTEBRAL BILAT MOD SED  05/14/2020   IR GUIDED DRAIN W CATHETER PLACEMENT  02/05/2021   IR RADIOLOGIST EVAL & MGMT  02/13/2021   LOOP RECORDER INSERTION   06/2020   last remote check was on 03/12/21   MELANOMA EXCISION  07/28/2011   Dr Jadine; stomach and chest   REMOVAL OF STONES  12/07/2018   Procedure: REMOVAL OF STONES;  Surgeon: Wilhelmenia Aloha Raddle., MD;  Location: [facility];  Service: Gastroenterology;;   TONSILLECTOMY  1951   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   UPPER GASTROINTESTINAL ENDOSCOPY  1983   hiatal hernia   VASECTOMY  1979    Current Outpatient Medications  Medication Instructions   allopurinol  (ZYLOPRIM ) 300 mg, Oral, Daily   amLODipine  (NORVASC ) 5 mg, Oral, Daily   atorvastatin  (LIPITOR) 20 MG tablet TAKE 1 TABLET(20 MG) BY MOUTH AT BEDTIME   clopidogrel  (PLAVIX ) 75 mg, Oral, Daily   esomeprazole  (NEXIUM ) 40 mg, Oral, Daily before breakfast   fluticasone  (FLONASE ) 50 MCG/ACT nasal spray 2 sprays, Daily PRN   L-Methylfolate-B12-B6-B2 (CEREFOLIN) 09-25-48-5 MG TABS 1 tablet, Oral, Every morning   lamoTRIgine  (LAMICTAL ) 25 mg, Daily   latanoprost (XALATAN) 0.005 % ophthalmic solution 1 drop, Daily at bedtime   levothyroxine  (SYNTHROID ) 75 MCG tablet TAKE 1 TABLET(75 MCG) BY MOUTH DAILY BEFORE BREAKFAST   metoprolol  succinate (TOPROL -XL) 150 mg, Oral, Daily, Take with or immediately following a meal   Na Sulfate-K Sulfate-Mg Sulfate concentrate (SUPREP) 17.5-3.13-1.6 GM/177ML SOLN  Once   naltrexone (DEPADE) 50 mg, Daily   QUEtiapine  (SEROQUEL ) 800 mg, Daily at bedtime   timolol (TIMOPTIC) 0.5 % ophthalmic solution 1 drop, 2 times daily   vitamin B-1 250 mg, Oral, Daily   Vitamin D  2,000 Units, Daily       Objective:   Physical Exam BP 138/88   Pulse 70   Temp (!) 97.3 F (36.3 C) (Oral)   Resp 18   Ht 6' 1 (1.854 m)   Wt 220 lb 2 oz (99.8 kg)   SpO2 99%   BMI 29.04 kg/m  General:   Well developed, NAD, BMI noted. HEENT:  Normocephalic . Face symmetric, atraumatic Lungs:  CTA B Normal respiratory effort, no intercostal retractions, no accessory muscle use. Heart: RRR,  no murmur.   Lower extremities: no pretibial edema bilaterally  Skin: Not pale. Not jaundice Neurologic:  alert & oriented X3.  Speech normal, gait appropriate for age and unassisted Psych--  Cognition and judgment appear intact.  Cooperative with normal attention span and concentration.  Behavior appropriate. No anxious or depressed appearing.      Assessment    Assessment  HTN Bipolar disorder-- Dr Vincente, dc lithium  [phone] EtOH abuse-- remission w/ occ relapse Hypothyroidism Hyperlipidemia CV: Atrial Tachycardia dx 03/2019, + Zio patch, has a implantable loop recorder  Neuro: - Acute cryptogenic stroke and SAH traumatic 04-2020 -[facility]CI Dx 03/2021 at neurology --TIA?  07-2021, ASA changed to Plavix  - saw neuro 02/2024- RTC PRN Gout: Dr Lavell, released to PCP 2018 Melanoma 2014 Dr. Jordan  Osteopenia  T score -2.4  ( [phone];  T score is -2.0  (08/2018).  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  Continue metoprolol  XL 150 mg daily, add amlodipine , . Advised lifestyle modifications: reduce salt, maintain physical activity, avoid NSAIDs. Check BMP Hyperlipidemia Managed with atorvastatin  20 mg daily.  Check FLP  hypothyroidism Managed with levothyroxine  75 mcg daily.  Check TSH Gout No recent flares, managed with allopurinol  300 mg daily. Last LFTs normal, check a uric acid. Osteopenia: Previous T-score of -2.2, managed with vitamin D  supplementation.  Check a DEXA Alcohol dependence, in remission Sober for two years! H/os stroke, TIAs: Neurology OV 03/20/2024, rec to continue Plavix  and good control of HTN and other cardiovascular risks.  Continue cognitive  challenge.  Was rec to RTC PRN per note review RTC 4 months     "

## 2024-05-15 NOTE — Patient Instructions (Addendum)
 Please read your instructions carefully.   GO TO THE LAB :  Get the blood work    Go to the front desk for the checkout Please make an appointment for a checkup in 4 months   Vaccines to consider: COVID booster if not done in the last 4 months  You can stop by the first floor and arrange for a bone density test  Your blood pressure needs better control. Amlodipine  5 mg 1 tablet daily Continue checking your blood pressure regularly Blood pressure goal:  between 110/65 and  130/80. If it is consistently higher or lower, let me know     HYPERLIPIDEMIA: Your cholesterol levels are being managed with atorvastatin . -Continue taking atorvastatin  20 mg daily. -We ordered a FLP to monitor your lipid levels.  HYPOTHYROIDISM: Your thyroid  function is being managed with levothyroxine . -Continue taking levothyroxine  75 mcg daily. -We ordered a TSH test to monitor your thyroid  function.  GOUT: Your gout is being managed with allopurinol  and you have had no recent flares. -Continue taking allopurinol  300 mg daily. -We ordered a uric acid level test to monitor your gout management.  OSTEOPENIA: Your bone health is being managed with vitamin D  supplementation. -Continue taking vitamin D  supplementation. -We ordered a bone density test to monitor your bone health.

## 2024-05-16 ENCOUNTER — Telehealth: Payer: Self-pay | Admitting: *Deleted

## 2024-05-16 ENCOUNTER — Ambulatory Visit

## 2024-05-16 NOTE — Telephone Encounter (Signed)
 Pt scheduled for AWV. No answer x 2 attempts. Left message to call and r/s.

## 2024-06-06 ENCOUNTER — Encounter

## 2024-06-08 ENCOUNTER — Ambulatory Visit

## 2024-06-20 ENCOUNTER — Other Ambulatory Visit (HOSPITAL_BASED_OUTPATIENT_CLINIC_OR_DEPARTMENT_OTHER)

## 2024-07-07 ENCOUNTER — Encounter

## 2024-07-09 ENCOUNTER — Ambulatory Visit

## 2024-08-07 ENCOUNTER — Encounter

## 2024-08-09 ENCOUNTER — Ambulatory Visit

## 2024-09-12 ENCOUNTER — Ambulatory Visit: Admitting: Internal Medicine
